# Patient Record
Sex: Male | Born: 1960 | Race: Asian | Hispanic: No | Marital: Married
Health system: Southern US, Academic
[De-identification: ages and names within clinical notes are randomized; demographics above are authoritative.]

## PROBLEM LIST (undated history)

## (undated) ENCOUNTER — Telehealth

## (undated) ENCOUNTER — Ambulatory Visit

## (undated) ENCOUNTER — Encounter

## (undated) ENCOUNTER — Encounter
Attending: Student in an Organized Health Care Education/Training Program | Primary: Student in an Organized Health Care Education/Training Program

## (undated) ENCOUNTER — Ambulatory Visit: Payer: MEDICARE | Attending: Nephrology | Primary: Nephrology

## (undated) ENCOUNTER — Encounter: Attending: Physician Assistant | Primary: Physician Assistant

## (undated) ENCOUNTER — Encounter: Attending: Nephrology | Primary: Nephrology

## (undated) ENCOUNTER — Ambulatory Visit: Attending: Family | Primary: Family

## (undated) ENCOUNTER — Ambulatory Visit: Payer: MEDICARE | Attending: Family | Primary: Family

## (undated) ENCOUNTER — Ambulatory Visit
Payer: MEDICARE | Attending: Student in an Organized Health Care Education/Training Program | Primary: Student in an Organized Health Care Education/Training Program

## (undated) ENCOUNTER — Ambulatory Visit: Payer: MEDICARE

## (undated) ENCOUNTER — Encounter: Attending: Family | Primary: Family

## (undated) ENCOUNTER — Encounter: Attending: Gastroenterology | Primary: Gastroenterology

## (undated) ENCOUNTER — Ambulatory Visit
Payer: MEDICARE | Attending: Rehabilitative and Restorative Service Providers" | Primary: Rehabilitative and Restorative Service Providers"

## (undated) ENCOUNTER — Other Ambulatory Visit

## (undated) ENCOUNTER — Ambulatory Visit: Payer: MEDICARE | Attending: Surgical Critical Care | Primary: Surgical Critical Care

## (undated) ENCOUNTER — Telehealth: Attending: Family | Primary: Family

## (undated) ENCOUNTER — Ambulatory Visit: Payer: MEDICARE | Attending: Registered" | Primary: Registered"

## (undated) DIAGNOSIS — N189 Chronic kidney disease, unspecified: Secondary | ICD-10-CM

## (undated) DIAGNOSIS — I10 Essential (primary) hypertension: Secondary | ICD-10-CM

## (undated) HISTORY — PX: PARATHYROIDECTOMY: SHX19

## (undated) HISTORY — DX: Essential (primary) hypertension: I10

## (undated) HISTORY — PX: RENAL BIOPSY: SHX156

## (undated) HISTORY — PX: PERITONEAL CATHETER INSERTION: SHX2223

## (undated) HISTORY — DX: Chronic kidney disease, unspecified: N18.9

---
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
## 2009-07-07 ENCOUNTER — Emergency Department: Payer: Self-pay | Admitting: Emergency Medicine

## 2010-07-17 ENCOUNTER — Inpatient Hospital Stay: Payer: Self-pay | Admitting: Internal Medicine

## 2010-07-17 ENCOUNTER — Encounter: Payer: Self-pay | Admitting: Cardiovascular Disease

## 2010-07-20 ENCOUNTER — Encounter: Payer: Self-pay | Admitting: Cardiovascular Disease

## 2010-07-22 ENCOUNTER — Encounter: Payer: Self-pay | Admitting: Cardiovascular Disease

## 2010-09-15 ENCOUNTER — Ambulatory Visit: Payer: Self-pay | Admitting: Vascular Surgery

## 2010-09-22 ENCOUNTER — Ambulatory Visit: Payer: Self-pay | Admitting: Vascular Surgery

## 2010-10-01 ENCOUNTER — Ambulatory Visit: Payer: Self-pay | Admitting: Cardiovascular Disease

## 2010-10-01 DIAGNOSIS — R079 Chest pain, unspecified: Secondary | ICD-10-CM

## 2010-10-01 DIAGNOSIS — I10 Essential (primary) hypertension: Secondary | ICD-10-CM

## 2010-11-02 ENCOUNTER — Ambulatory Visit: Payer: Self-pay | Admitting: Internal Medicine

## 2011-01-04 NOTE — Letter (Signed)
Summary: [facility] Rev Report  [facility] Rev Report   Imported By: Marilynne Drivers 11/12/2010 12:24:12  _____________________________________________________________________  External Attachment:    Type:   Image     Comment:   External Document

## 2011-01-04 NOTE — Assessment & Plan Note (Signed)
Summary: NP6/AMD   Visit Type:  Initial Consult Referring Yechiel Erny:  Corinna Capra  CC:  c/o chest pain during his dialysis treatment one month ago.  Occas. has dizziness and headache..  History of Present Illness: Matthew Mathis is a very pleasant 50 year old gentleman, patient of Dr. Candiss Norse, history of end-stage renal disease on dialysis Mathis the past 2 months, also history of hypertension, smoking who presents for new patient evaluation for an episode of chest pain.  He reports that while on dialysis one month ago, he had dizziness, headache and chest pain. Symptoms lasted for 10-20 minutes. He reports that since then, his hemodialysis volume removal as significantly decreased and his headaches and dizziness has resolved. He no longer has any episodes and has had no further chest pain.  He walks 1 mile daily with no symptoms of chest discomfort or shortness of breath. If he does have occasional dizziness, he takes a Tylenol and feels better. He does not drink much fluid as he was told to significantly restrict himself. In the past he has a significant smoking history though he has cut down considerably.  he denies any significant family history of coronary artery disease apart from renal dysfunction  EKG shows normal sinus rhythm with rate of 80 beats per minute, no significant ST or T wave changes  Current Medications (verified): 1)  Bystolic 5 Mg Tabs (Nebivolol Hcl) .... One Tablet Once Daily 2)  Colcrys 0.6 Mg Tabs (Colchicine) .... One Tablet Two Times A Day As Needed For Gout Pain 3)  Oxycodone-Acetaminophen 5-325 Mg Tabs (Oxycodone-Acetaminophen) .... Take One To Two Tablet Every Six Hours Prn 4)  Aspirin 81 Mg Tbec (Aspirin) .... Take One Tablet By Mouth Daily 5)  Uloric 40 Mg Tabs (Febuxostat) .... Take 1 Tablet By Mouth Once A Day 6)  Renvela 800 Mg Tabs (Sevelamer Carbonate) .... Take 2 Tablets Before Meals and 1 Tablet With Snack 7)  Sensipar 30 Mg Tabs (Cinacalcet Hcl) .... Take 1  Tablet By Mouth Once A Day  Allergies (verified): No Known Drug Allergies  Past History:  Past Medical History: Last updated: 09/28/2010 Hypertension Hx. of gout Hx. of chronic renal failure  Past Surgical History: Last updated: 09/28/2010 kidney biopsy; nonspecific  Family History: Last updated: 09/28/2010 Father: Diabetes Mother, brother & sister; hypertension  Social History: Last updated: 09/28/2010 Work for a [employer]. Tobacco Use - Yes. Hx. of heavy smoking before 2 to 3 ppd, now 3 cigarettes daily. Alcohol Use - no Drug Use - no  Risk Factors: Smoking Status: current (09/28/2010)  Vital Signs:  Patient profile:   50 year old male Height:      67 inches Weight:      155 pounds BMI:     24.36 Pulse rate:   80 / minute BP sitting:   130 / 90  (left arm) Cuff size:   regular  Vitals Entered By: Dolores Lory, CMA (October 01, 2010 10:55 AM)  Physical Exam  General:  Well developed, well nourished, in no acute distress. Head:  normocephalic and atraumatic Neck:  Neck supple, no JVD. No masses, thyromegaly or abnormal cervical nodes. Lungs:  Clear bilaterally to auscultation and percussion. Heart:  Non-displaced PMI, chest non-tender; regular rate and rhythm, S1, S2 without murmurs, rubs or gallops. Carotid upstroke normal, no bruit.  Pedals normal pulses. No edema, no varicosities. Abdomen:  Bowel sounds positive; abdomen soft and non-tender without masses Msk:  Back normal, normal gait. Muscle strength and tone normal. Pulses:  pulses normal in all 4 extremities Extremities:  No clubbing or cyanosis. Neurologic:  Alert and oriented x 3. Skin:  Intact without lesions or rashes. Psych:  Normal affect.   Impression & Recommendations:  Problem # 1:  CHEST PAIN UNSPECIFIED (ICD-786.50) his chest pain is somewhat atypical, he has had only one episode during dialysis. He had no further episodes since his dialysis regiment was changed with less  fluid removal. He ambulates with exercise 1 mile per day with no symptoms of chest discomfort. EKG is normal.  I suggested to him that he start aspirin 81 mg daily. That we continue to monitor him and if he has additional episodes of chest pain, we could do additional workup with a stress test. I would hold off on any testing at this time as he is asymptomatic.  We have suggested that we have him check his cholesterol at some point to make sure that he is within an acceptable range. He could possibly have this at his next blood draw with Dr. seen in the HD clinic.  His updated medication list for this problem includes:    Bystolic 5 Mg Tabs (Nebivolol hcl) ..... One tablet once daily    Aspirin 81 Mg Tbec (Aspirin) .[name]... Take one tablet by mouth daily  Orders: TLB-Lipid Panel (80061-LIPID)  Problem # 2:  HYPERTENSION, BENIGN (ICD-401.1) His blood pressure appears relatively well-controlled on today's visit.  His updated medication list for this problem includes:    Bystolic 5 Mg Tabs (Nebivolol hcl) ..... One tablet once daily    Aspirin 81 Mg Tbec (Aspirin) .[name]... Take one tablet by mouth daily  Other Orders: EKG w/ Interpretation (93000)  Patient Instructions: 1)  Your physician recommends that you schedule a follow-up appointment as needed 2)  Your physician recommends that you have your lipids checked at dialysis with other labwork. Electronic order given to pt to take to dialysis and fax results to Dr Rockey Situ.  3)  Your physician has recommended you make the following change in your medication: start 81mg  ASA daily  Appended Document: NP6/AMD cholesterol numbers from August 2011 showed cholesterol 104, LDL 62, HDL 26 I would not recommend a statin given these numbers

## 2011-06-20 ENCOUNTER — Encounter: Payer: Self-pay | Admitting: Cardiovascular Disease

## 2013-01-02 ENCOUNTER — Emergency Department: Payer: Self-pay | Admitting: Emergency Medicine

## 2013-01-02 LAB — URINALYSIS, COMPLETE
Bacteria: NONE SEEN
Glucose,UR: 50 mg/dL (ref 0–75)
Leukocyte Esterase: NEGATIVE
Protein: 30
RBC,UR: 7 /HPF (ref 0–5)
Specific Gravity: 1.006 (ref 1.003–1.030)
Squamous Epithelial: <1
WBC UR: 1 /HPF (ref 0–5)

## 2013-01-02 LAB — COMPREHENSIVE METABOLIC PANEL
Albumin: 3.7 g/dL (ref 3.4–5.0)
Alkaline Phosphatase: 81 U/L (ref 50–136)
BUN: 71 mg/dL — ABNORMAL HIGH (ref 7–18)
Bilirubin,Total: 0.6 mg/dL (ref 0.2–1.0)
Calcium, Total: 8.5 mg/dL (ref 8.5–10.1)
Chloride: 99 mmol/L (ref 98–107)
Co2: 25 mmol/L (ref 21–32)
Creatinine: 10.21 mg/dL — ABNORMAL HIGH (ref 0.60–1.30)
EGFR (African American): 6 — ABNORMAL LOW
EGFR (Non-African Amer.): 5 — ABNORMAL LOW
Glucose: 99 mg/dL (ref 65–99)
Osmolality: 293 (ref 275–301)
Potassium: 3.4 mmol/L — ABNORMAL LOW (ref 3.5–5.1)
SGOT(AST): 13 U/L — ABNORMAL LOW (ref 15–37)
Sodium: 136 mmol/L (ref 136–145)
Total Protein: 7.2 g/dL (ref 6.4–8.2)

## 2013-01-02 LAB — CBC
HCT: 37.1 % — ABNORMAL LOW (ref 40.0–52.0)
HGB: 12.5 g/dL — ABNORMAL LOW (ref 13.0–18.0)
Platelet: 266 10*3/uL (ref 150–440)
RBC: 3.9 10*6/uL — ABNORMAL LOW (ref 4.40–5.90)
RDW: 15.1 % — ABNORMAL HIGH (ref 11.5–14.5)
WBC: 13.8 10*3/uL — ABNORMAL HIGH (ref 3.8–10.6)

## 2013-01-02 LAB — BODY FLUID CELL COUNT WITH DIFFERENTIAL
Basophil: 0 %
Eosinophil: 0 %
Lymphocytes: 35 %
Nucleated Cell Count: 119 /mm3
Other Cells BF: 2 %
Other Mononuclear Cells: 58 %

## 2013-01-02 LAB — PROTIME-INR
INR: 0.9
Prothrombin Time: 13 secs (ref 11.5–14.7)

## 2013-01-08 LAB — CULTURE, BLOOD (SINGLE)

## 2015-07-16 NOTE — Unmapped (Signed)
Letter sent to pt and dialysis

## 2016-10-10 ENCOUNTER — Emergency Department: Payer: Medicare Other

## 2016-10-10 ENCOUNTER — Encounter: Payer: Self-pay | Admitting: *Deleted

## 2016-10-10 ENCOUNTER — Emergency Department
Admission: EM | Admit: 2016-10-10 | Discharge: 2016-10-10 | Disposition: A | Discharge status: Home / Self Care | Payer: Medicare Other | Attending: Emergency Medicine | Admitting: Emergency Medicine

## 2016-10-10 DIAGNOSIS — Z7982 Long term (current) use of aspirin: Secondary | ICD-10-CM | POA: Insufficient documentation

## 2016-10-10 DIAGNOSIS — N189 Chronic kidney disease, unspecified: Secondary | ICD-10-CM | POA: Diagnosis not present

## 2016-10-10 DIAGNOSIS — I129 Hypertensive chronic kidney disease with stage 1 through stage 4 chronic kidney disease, or unspecified chronic kidney disease: Secondary | ICD-10-CM | POA: Diagnosis not present

## 2016-10-10 DIAGNOSIS — R1012 Left upper quadrant pain: Secondary | ICD-10-CM

## 2016-10-10 DIAGNOSIS — Z79899 Other long term (current) drug therapy: Secondary | ICD-10-CM | POA: Insufficient documentation

## 2016-10-10 LAB — CBC
HCT: 33.4 % — ABNORMAL LOW (ref 40.0–52.0)
HEMOGLOBIN: 11.6 g/dL — AB (ref 13.0–18.0)
MCH: 32.8 pg (ref 26.0–34.0)
MCHC: 34.6 g/dL (ref 32.0–36.0)
MCV: 95 fL (ref 80.0–100.0)
Platelets: 335 10*3/uL (ref 150–440)
RBC: 3.52 MIL/uL — AB (ref 4.40–5.90)
RDW: 15.1 % — ABNORMAL HIGH (ref 11.5–14.5)
WBC: 9.5 10*3/uL (ref 3.8–10.6)

## 2016-10-10 LAB — COMPREHENSIVE METABOLIC PANEL
ALK PHOS: 217 U/L — AB (ref 38–126)
ALT: 15 U/L — ABNORMAL LOW (ref 17–63)
ANION GAP: 16 — AB (ref 5–15)
AST: 17 U/L (ref 15–41)
Albumin: 4.2 g/dL (ref 3.5–5.0)
BUN: 60 mg/dL — ABNORMAL HIGH (ref 6–20)
CALCIUM: 7.9 mg/dL — AB (ref 8.9–10.3)
CO2: 26 mmol/L (ref 22–32)
Chloride: 96 mmol/L — ABNORMAL LOW (ref 101–111)
Creatinine, Ser: 12.48 mg/dL — ABNORMAL HIGH (ref 0.61–1.24)
GFR calc non Af Amer: 4 mL/min — ABNORMAL LOW (ref >60)
GFR, EST AFRICAN AMERICAN: 5 mL/min — AB (ref >60)
Glucose, Bld: 186 mg/dL — ABNORMAL HIGH (ref 65–99)
POTASSIUM: 4.1 mmol/L (ref 3.5–5.1)
SODIUM: 138 mmol/L (ref 135–145)
TOTAL PROTEIN: 8.1 g/dL (ref 6.5–8.1)
Total Bilirubin: 0.6 mg/dL (ref 0.3–1.2)

## 2016-10-10 LAB — LIPASE, BLOOD: Lipase: 31 U/L (ref 11–51)

## 2016-10-10 MED ORDER — TRAMADOL HCL 50 MG PO TABS: 50.0000 mg | ORAL_TABLET | Freq: Four times a day (QID) | ORAL | 0 refills | Status: DC | PRN

## 2016-10-10 NOTE — ED Provider Notes (Signed)
[facility] Emergency Department Provider Note        Time seen: ----------------------------------------- 1:54 PM on 10/10/2016 -----------------------------------------    I have reviewed the triage vital signs and the nursing notes.   HISTORY  Chief Complaint Abdominal Pain    HPI Matthew Mathis is a 55 y.o. male who presents to the ER for abdominal pain for the last month. Patient's pain is in the left upper quadrant, he denies fevers or any other symptoms. Currently is on peritoneal dialysis. Patient states the pain is worse during the time when he is on peritoneal dialysis. When he first wakes up in the morning his pain is minimal to absent, but at night when he is doing peritoneal dialysis pain gets much worse. Pain is 6 inches superior to his dialysis catheter insertion. He states he is going to the bathroom normally, was seen by nephrology 2 weeks ago and placed on Protonix.   Past Medical History:  Diagnosis Date  . Chronic renal failure    hx   . Gout    hx  . HTN (hypertension)     Patient Active Problem List   Diagnosis Date Noted  . HYPERTENSION, BENIGN 10/01/2010  . CHEST PAIN UNSPECIFIED 10/01/2010    Past Surgical History:  Procedure Laterality Date  . RENAL BIOPSY     non specific     Allergies Patient has no known allergies.  Social History Social History  Substance Use Topics  . Smoking status: Never Smoker  . Smokeless tobacco: Not on file     Comment: used to smoke 2-3 ppd, now smokes 3 cigarettes daily   . Alcohol use No    Review of Systems Constitutional: Negative for fever. Cardiovascular: Negative for chest pain. Respiratory: Negative for shortness of breath. Gastrointestinal: Positive for abdominal pain Genitourinary: Negative for dysuria. Musculoskeletal: Negative for back pain. Skin: Negative for rash. Neurological: Negative for headaches, focal weakness or numbness.  10-point ROS otherwise  negative.  ____________________________________________   PHYSICAL EXAM:  VITAL SIGNS: ED Triage Vitals  Enc Vitals Group     BP 10/10/16 1054 134/79     Pulse Rate 10/10/16 1054 89     Resp 10/10/16 1054 18     Temp 10/10/16 1054 98.5 F (36.9 C)     Temp Source 10/10/16 1054 Oral     SpO2 10/10/16 1054 100 %     Weight 10/10/16 1056 165 lb (74.8 kg)     Height 10/10/16 1056 5\' 4"  (1.626 m)     Head Circumference --      Peak Flow --      Pain Score --      Pain Loc --      Pain Edu? --      Excl. in [place]? --     Constitutional: Alert and oriented. Well appearing and in no distress. Eyes: Conjunctivae are normal. PERRL. Normal extraocular movements. ENT   Head: Normocephalic and atraumatic.   Nose: No congestion/rhinnorhea.   Mouth/Throat: Mucous membranes are moist.   Neck: No stridor. Cardiovascular: Normal rate, regular rhythm. No murmurs, rubs, or gallops. Respiratory: Normal respiratory effort without tachypnea nor retractions. Breath sounds are clear and equal bilaterally. No wheezes/rales/rhonchi. Gastrointestinal: Soft and nontender. Normal bowel sounds, peritoneal dialysis catheter site is unremarkable Musculoskeletal: Nontender with normal range of motion in all extremities. No lower extremity tenderness nor edema. Neurologic:  Normal speech and language. No gross focal neurologic deficits are appreciated.  Skin:  Skin is warm,  dry and intact. No rash noted. Psychiatric: Mood and affect are normal. Speech and behavior are normal.  ____________________________________________  ED COURSE:  Pertinent labs & imaging results that were available during my care of the patient were reviewed by me and considered in my medical decision making (see chart for details). Clinical Course   Patient is in no distress, no signs of peritonitis or concerning intra-abdominal infection. I will discussed with nephrology and check basic  labs.  Procedures ____________________________________________   LABS (pertinent positives/negatives)  Labs Reviewed  COMPREHENSIVE METABOLIC PANEL - Abnormal; Notable for the following:       Result Value   Chloride 96 (*)    Glucose, Bld 186 (*)    BUN 60 (*)    Creatinine, Ser 12.48 (*)    Calcium 7.9 (*)    ALT 15 (*)    Alkaline Phosphatase 217 (*)    GFR calc non Af Amer 4 (*)    GFR calc Af Amer 5 (*)    Anion gap 16 (*)    All other components within normal limits  CBC - Abnormal; Notable for the following:    RBC 3.52 (*)    Hemoglobin 11.6 (*)    HCT 33.4 (*)    RDW 15.1 (*)    All other components within normal limits  LIPASE, BLOOD  URINALYSIS COMPLETEWITH MICROSCOPIC ([facility] ONLY)    RADIOLOGY Images were viewed by me  FINDINGS: Bowel gas pattern is unremarkable. Peritoneal dialysis catheter is coiled in the anatomic pelvis. Probable renal artery calcifications bilaterally. No acute findings.  IMPRESSION: Negative.  ____________________________________________  FINAL ASSESSMENT AND PLAN  Abdominal pain, peritoneal dialysis  Plan: Patient with labs and imaging as dictated above. Patient is in no distress, patient has been discussed with nephrology. This is a subacute or chronic problem at this point. He does not have signs of peritonitis, labs are reassuring. He will follow up as an outpatient. He'll be given mild pain medication and encouraged to return for worsening symptoms.   Earleen Newport, MD   Note: This dictation was prepared with Dragon dictation. Any transcriptional errors that result from this process are unintentional    Earleen Newport, MD 10/10/16 [phone]

## 2016-10-10 NOTE — ED Notes (Signed)
Pt awake and alert, resting in bed, playing on phone, pt in no distress

## 2016-10-10 NOTE — ED Triage Notes (Signed)
Pt performs peritoneal dialysis at home, pt complains of left upper quad pain for 1 month, pt denies fevers or any other symptoms

## 2016-10-10 NOTE — ED Notes (Signed)
EDP at bedside, pt does PD, pt states abd pain for 1 month, denies any fevers or cloudy fluid, pt awake and alert in no acute distress

## 2016-11-02 ENCOUNTER — Other Ambulatory Visit: Payer: Self-pay | Admitting: Nephrology

## 2016-11-02 DIAGNOSIS — R109 Unspecified abdominal pain: Secondary | ICD-10-CM

## 2016-11-04 ENCOUNTER — Ambulatory Visit: Payer: Medicare Other

## 2016-11-10 ENCOUNTER — Ambulatory Visit
Admission: RE | Admit: 2016-11-10 | Discharge: 2016-11-10 | Disposition: A | Discharge status: Home / Self Care | Payer: Medicare Other | Source: Ambulatory Visit | Attending: Nephrology | Admitting: Nephrology

## 2016-11-10 DIAGNOSIS — N281 Cyst of kidney, acquired: Secondary | ICD-10-CM | POA: Diagnosis not present

## 2016-11-10 DIAGNOSIS — R109 Unspecified abdominal pain: Secondary | ICD-10-CM | POA: Diagnosis present

## 2016-11-10 DIAGNOSIS — R93422 Abnormal radiologic findings on diagnostic imaging of left kidney: Secondary | ICD-10-CM | POA: Insufficient documentation

## 2016-11-10 DIAGNOSIS — R93421 Abnormal radiologic findings on diagnostic imaging of right kidney: Secondary | ICD-10-CM | POA: Diagnosis not present

## 2017-05-08 ENCOUNTER — Other Ambulatory Visit (INDEPENDENT_AMBULATORY_CARE_PROVIDER_SITE_OTHER): Payer: Self-pay | Admitting: Vascular Surgery

## 2017-05-08 ENCOUNTER — Encounter (INDEPENDENT_AMBULATORY_CARE_PROVIDER_SITE_OTHER): Payer: Self-pay | Admitting: Vascular Surgery

## 2017-05-08 ENCOUNTER — Ambulatory Visit (INDEPENDENT_AMBULATORY_CARE_PROVIDER_SITE_OTHER): Payer: Medicare Other | Admitting: Vascular Surgery

## 2017-05-08 ENCOUNTER — Encounter (INDEPENDENT_AMBULATORY_CARE_PROVIDER_SITE_OTHER): Payer: Self-pay

## 2017-05-08 VITALS — BP 146/88 | HR 73 | Resp 16 | Ht 66.0 in | Wt 168.0 lb

## 2017-05-08 DIAGNOSIS — T829XXA Unspecified complication of cardiac and vascular prosthetic device, implant and graft, initial encounter: Secondary | ICD-10-CM | POA: Diagnosis not present

## 2017-05-08 DIAGNOSIS — I10 Essential (primary) hypertension: Secondary | ICD-10-CM

## 2017-05-08 DIAGNOSIS — N186 End stage renal disease: Secondary | ICD-10-CM | POA: Diagnosis not present

## 2017-05-08 NOTE — Progress Notes (Signed)
MRN : [id number]  Matthew Mathis is a 56 y.o. (Oct 28, 1961) male who presents with chief complaint of  Chief Complaint  Patient presents with  . New Evaluation    Discuss PD cath   .  History of Present Illness: The patient returns to the office for follow up regarding problem with the dialysis access. Currently the patient is maintained via a PD catheter The patient has had multiple failed upper extremity accesses.  The patient has redness and swelling at the access site. The patient denies fever or chills at home or while on dialysis.  The patient denies amaurosis fugax or recent TIA symptoms. There are no recent neurological changes noted. The patient denies claudication symptoms or rest pain symptoms. The patient denies history of DVT, PE or superficial thrombophlebitis. The patient denies recent episodes of angina or shortness of breath.      Current Meds  Medication Sig  . allopurinol (ZYLOPRIM) 100 MG tablet Take 1 tablet by mouth daily.  [name] aspirin 81 MG EC tablet Take 81 mg by mouth daily.    . calcium acetate (PHOSLO) 667 MG capsule Take 1,334 mg by mouth 3 (three) times daily. Take 2 capsules (1,334mg ) three times a day and 1 capsule with snacks.  . cinacalcet (SENSIPAR) 30 MG tablet Take 30 mg by mouth daily.    . febuxostat (ULORIC) 40 MG tablet Take 80 mg by mouth daily.    . nebivolol (BYSTOLIC) 5 MG tablet Take 5 mg by mouth daily.    . sevelamer (RENVELA) 800 MG tablet Take by mouth. Take 2 tablets with meals and 1 tablet with snack     Past Medical History:  Diagnosis Date  . Chronic renal failure    hx   . Gout    hx  . HTN (hypertension)     Past Surgical History:  Procedure Laterality Date  . RENAL BIOPSY     non specific     Social History Social History  Substance Use Topics  . Smoking status: Never Smoker  . Smokeless tobacco: Never Used     Comment: used to smoke 2-3 ppd, now smokes 3 cigarettes daily   . Alcohol use No    Family  History Family History  Problem Relation Age of Onset  . Diabetes Father   . Hypertension Mother        brother, sister     No Known Allergies   REVIEW OF SYSTEMS (Negative unless checked)  Constitutional: [] Weight loss  [] Fever  [] Chills Cardiac: [] Chest pain   [] Chest pressure   [] Palpitations   [] Shortness of breath when laying flat   [] Shortness of breath with exertion. Vascular:  [] Pain in legs with walking   [] Pain in legs at rest  [] History of DVT   [] Phlebitis   [] Swelling in legs   [] Varicose veins   [] Non-healing ulcers Pulmonary:   [] Uses home oxygen   [] Productive cough   [] Hemoptysis   [] Wheeze  [] COPD   [] Asthma Neurologic:  [] Dizziness   [] Seizures   [] History of stroke   [] History of TIA  [] Aphasia   [] Vissual changes   [] Weakness or numbness in arm   [] Weakness or numbness in leg Musculoskeletal:   [] Joint swelling   [] Joint pain   [] Low back pain Hematologic:  [] Easy bruising  [] Easy bleeding   [] Hypercoagulable state   [] Anemic Gastrointestinal:  [] Diarrhea   [] Vomiting  [] Gastroesophageal reflux/heartburn   [] Difficulty swallowing. Genitourinary:  [x] Chronic kidney disease   [] Difficult urination  []   Frequent urination   [] Blood in urine Skin:  [] Rashes   [] Ulcers  Psychological:  [] History of anxiety   []  History of major depression.  Physical Examination  Vitals:   05/08/17 0913  BP: (!) 146/88  Pulse: 73  Resp: 16  Weight: 168 lb (76.2 kg)  Height: 5\' 6"  (1.676 m)   Body mass index is 27.12 kg/m. Gen: WD/WN, NAD Head: /AT, No temporalis wasting.  Ear/Nose/Throat: Hearing grossly intact, nares w/o erythema or drainage, poor dentition Eyes: PER, EOMI, sclera nonicteric.  Neck: Supple, no masses.  No bruit or JVD.  Pulmonary:  Good air movement, clear to auscultation bilaterally, no use of accessory muscles.  Cardiac: RRR, normal S1, S2, no Murmurs. Vascular: PD cath with drainage  Vessel Right Left  Radial Palpable Palpable  Ulnar Palpable Palpable   Gastrointestinal: soft, non-distended. No guarding/no peritoneal signs.  Musculoskeletal: M/S 5/5 throughout.  No deformity or atrophy.  Neurologic: CN 2-12 intact. Pain and light touch intact in extremities.  Symmetrical.  Speech is fluent. Motor exam as listed above. Psychiatric: Judgment intact, Mood & affect appropriate for pt's clinical situation. Dermatologic: No rashes or ulcers noted.  No changes consistent with cellulitis. Lymph : No Cervical lymphadenopathy, no lichenification or skin changes of chronic lymphedema.  CBC Lab Results  Component Value Date   WBC 9.5 10/10/2016   HGB 11.6 (L) 10/10/2016   HCT 33.4 (L) 10/10/2016   MCV 95.0 10/10/2016   PLT 335 10/10/2016    BMET    Component Value Date/Time   NA 138 10/10/2016 1100   NA 136 01/02/2013 1611   K 4.1 10/10/2016 1100   K 3.4 (L) 01/02/2013 1611   CL 96 (L) 10/10/2016 1100   CL 99 01/02/2013 1611   CO2 26 10/10/2016 1100   CO2 25 01/02/2013 1611   GLUCOSE 186 (H) 10/10/2016 1100   GLUCOSE 99 01/02/2013 1611   BUN 60 (H) 10/10/2016 1100   BUN 71 (H) 01/02/2013 1611   CREATININE 12.48 (H) 10/10/2016 1100   CREATININE 10.21 (H) 01/02/2013 1611   CALCIUM 7.9 (L) 10/10/2016 1100   CALCIUM 8.5 01/02/2013 1611   GFRNONAA 4 (L) 10/10/2016 1100   GFRNONAA 5 (L) 01/02/2013 1611   GFRAA 5 (L) 10/10/2016 1100   GFRAA 6 (L) 01/02/2013 1611   CrCl cannot be calculated (Patient's most recent lab result is older than the maximum 21 days allowed.).  COAG Lab Results  Component Value Date   INR 0.9 01/02/2013    Radiology No results found.  Assessment/Plan 1. End stage renal disease ([place]) Recommend:  The patient is experiencing increasing problems with their dialysis access.  Patient should have removal of her PD cath replacement of a PD catheter and insertion of a tunneled catheter.    The risks, benefits and alternative therapies were reviewed in detail with the patient.  All questions were answered.   The patient agrees to proceed with angio/intervention.    2. Complication from renal dialysis device, initial encounter See #1  3. HYPERTENSION, BENIGN Continue antihypertensive medications as already ordered, these medications have been reviewed and there are no changes at this time.     Hortencia Pilar, MD  05/08/2017 12:44 PM

## 2017-05-15 ENCOUNTER — Inpatient Hospital Stay: Admission: RE | Admit: 2017-05-15 | Payer: Medicare Other | Source: Ambulatory Visit

## 2017-05-15 ENCOUNTER — Encounter (INDEPENDENT_AMBULATORY_CARE_PROVIDER_SITE_OTHER): Payer: Self-pay | Admitting: Vascular Surgery

## 2017-05-18 ENCOUNTER — Encounter
Admission: RE | Admit: 2017-05-18 | Discharge: 2017-05-18 | Disposition: A | Discharge status: Home / Self Care | Payer: Medicare Other | Source: Ambulatory Visit | Attending: Vascular Surgery | Admitting: Vascular Surgery

## 2017-05-18 DIAGNOSIS — Z01812 Encounter for preprocedural laboratory examination: Secondary | ICD-10-CM | POA: Diagnosis present

## 2017-05-18 DIAGNOSIS — Z01810 Encounter for preprocedural cardiovascular examination: Secondary | ICD-10-CM | POA: Insufficient documentation

## 2017-05-18 LAB — BASIC METABOLIC PANEL
Anion gap: 16 — ABNORMAL HIGH (ref 5–15)
BUN: 61 mg/dL — AB (ref 6–20)
CO2: 26 mmol/L (ref 22–32)
CREATININE: 13.1 mg/dL — AB (ref 0.61–1.24)
Calcium: 8.9 mg/dL (ref 8.9–10.3)
Chloride: 97 mmol/L — ABNORMAL LOW (ref 101–111)
GFR calc Af Amer: 4 mL/min — ABNORMAL LOW (ref >60)
GFR, EST NON AFRICAN AMERICAN: 4 mL/min — AB (ref >60)
Glucose, Bld: 102 mg/dL — ABNORMAL HIGH (ref 65–99)
Potassium: 4 mmol/L (ref 3.5–5.1)
SODIUM: 139 mmol/L (ref 135–145)

## 2017-05-18 LAB — CBC WITH DIFFERENTIAL/PLATELET
BASOS ABS: 0.1 10*3/uL (ref 0–0.1)
Basophils Relative: 1 %
EOS ABS: 0.9 10*3/uL — AB (ref 0–0.7)
EOS PCT: 9 %
HCT: 33.5 % — ABNORMAL LOW (ref 40.0–52.0)
Hemoglobin: 11.3 g/dL — ABNORMAL LOW (ref 13.0–18.0)
LYMPHS ABS: 0.7 10*3/uL — AB (ref 1.0–3.6)
Lymphocytes Relative: 8 %
MCH: 32.4 pg (ref 26.0–34.0)
MCHC: 33.8 g/dL (ref 32.0–36.0)
MCV: 96 fL (ref 80.0–100.0)
MONO ABS: 0.7 10*3/uL (ref 0.2–1.0)
Monocytes Relative: 7 %
Neutro Abs: 7.2 10*3/uL — ABNORMAL HIGH (ref 1.4–6.5)
Neutrophils Relative %: 75 %
PLATELETS: 282 10*3/uL (ref 150–440)
RBC: 3.49 MIL/uL — AB (ref 4.40–5.90)
RDW: 15.8 % — AB (ref 11.5–14.5)
WBC: 9.5 10*3/uL (ref 3.8–10.6)

## 2017-05-18 LAB — TYPE AND SCREEN
ABO/RH(D): O POS
ANTIBODY SCREEN: NEGATIVE

## 2017-05-18 LAB — PROTIME-INR
INR: 0.99
PROTHROMBIN TIME: 13.1 s (ref 11.4–15.2)

## 2017-05-18 LAB — SURGICAL PCR SCREEN
MRSA, PCR: NEGATIVE
STAPHYLOCOCCUS AUREUS: NEGATIVE

## 2017-05-18 LAB — APTT: APTT: 31 s (ref 24–36)

## 2017-05-18 NOTE — Patient Instructions (Signed)
Your procedure is scheduled on: Wednesday 05/24/17 Report to [place]. 2ND FLOOR MEDICAL MALL ENTRANCE. To find out your arrival time please call [phone] between 1PM - 3PM on Tuesday 05/23/17.  Remember: Instructions that are not followed completely may result in serious medical risk, up to and including death, or upon the discretion of your surgeon and anesthesiologist your surgery may need to be rescheduled.    __X__ 1. Do not eat food or drink liquids after midnight. No gum chewing or hard candies.     __X__ 2. No Alcohol for 24 hours before or after surgery.   ____ 3. Bring all medications with you on the day of surgery if instructed.    __X__ 4. Notify your doctor if there is any change in your medical condition     (cold, fever, infections).             __X___5. No smoking within 24 hours of your surgery.     Do not wear jewelry, make-up, hairpins, clips or nail polish.  Do not wear lotions, powders, or perfumes.   Do not shave 48 hours prior to surgery. Men may shave face and neck.  Do not bring valuables to the hospital.    [facility] is not responsible for any belongings or valuables.               Contacts, dentures or bridgework may not be worn into surgery.  Leave your suitcase in the car. After surgery it may be brought to your room.  For patients admitted to the hospital, discharge time is determined by your                treatment team.   Patients discharged the day of surgery will not be allowed to drive home.   Please read over the following fact sheets that you were given:   MRSA Information   __X__ Take these medicines the morning of surgery with A SIP OF WATER:    1. IRBESARTAN  2.   3.   4.  5.  6.  ____ Fleet Enema (as directed)   __X__ Use CHG Soap as directed  ____ Use inhalers on the day of surgery  ____ Stop metformin 2 days prior to surgery    ____ Take 1/2 of usual insulin dose the night before surgery and none on the morning of  surgery.   ____ Stop Coumadin/Plavix/aspirin on   ____ Stop Anti-inflammatories such as Advil, Aleve, Ibuprofen, Motrin, Naproxen, Naprosyn, Goodies,powder, or aspirin products.  OK to take Tylenol.   ____ Stop supplements until after surgery.    ____ Bring C-Pap to the hospital.

## 2017-05-24 ENCOUNTER — Ambulatory Visit: Payer: Medicare Other | Admitting: Anesthesiology

## 2017-05-24 ENCOUNTER — Ambulatory Visit: Payer: Medicare Other

## 2017-05-24 ENCOUNTER — Encounter: Payer: Self-pay | Admitting: *Deleted

## 2017-05-24 ENCOUNTER — Ambulatory Visit
Admission: RE | Admit: 2017-05-24 | Discharge: 2017-05-24 | Disposition: A | Discharge status: Home / Self Care | Payer: Medicare Other | Source: Ambulatory Visit | Attending: Vascular Surgery | Admitting: Vascular Surgery

## 2017-05-24 ENCOUNTER — Encounter
Admission: RE | Disposition: A | Discharge status: Home / Self Care | Payer: Self-pay | Source: Ambulatory Visit | Attending: Vascular Surgery

## 2017-05-24 DIAGNOSIS — I120 Hypertensive chronic kidney disease with stage 5 chronic kidney disease or end stage renal disease: Secondary | ICD-10-CM | POA: Insufficient documentation

## 2017-05-24 DIAGNOSIS — Z6827 Body mass index (BMI) 27.0-27.9, adult: Secondary | ICD-10-CM | POA: Diagnosis not present

## 2017-05-24 DIAGNOSIS — E669 Obesity, unspecified: Secondary | ICD-10-CM | POA: Diagnosis not present

## 2017-05-24 DIAGNOSIS — J449 Chronic obstructive pulmonary disease, unspecified: Secondary | ICD-10-CM | POA: Insufficient documentation

## 2017-05-24 DIAGNOSIS — N186 End stage renal disease: Secondary | ICD-10-CM | POA: Diagnosis not present

## 2017-05-24 DIAGNOSIS — F17210 Nicotine dependence, cigarettes, uncomplicated: Secondary | ICD-10-CM | POA: Diagnosis not present

## 2017-05-24 DIAGNOSIS — Z79899 Other long term (current) drug therapy: Secondary | ICD-10-CM | POA: Diagnosis not present

## 2017-05-24 HISTORY — PX: REMOVAL OF A DIALYSIS CATHETER: SHX6053

## 2017-05-24 HISTORY — PX: INSERTION OF DIALYSIS CATHETER: SHX1324

## 2017-05-24 HISTORY — PX: CAPD INSERTION: SHX5233

## 2017-05-24 LAB — POCT I-STAT 4, (NA,K, GLUC, HGB,HCT)
Glucose, Bld: 107 mg/dL — ABNORMAL HIGH (ref 65–99)
HEMATOCRIT: 32 % — AB (ref 39.0–52.0)
HEMOGLOBIN: 10.9 g/dL — AB (ref 13.0–17.0)
POTASSIUM: 3.7 mmol/L (ref 3.5–5.1)
SODIUM: 140 mmol/L (ref 135–145)

## 2017-05-24 LAB — ABO/RH: ABO/RH(D): O POS

## 2017-05-24 SURGERY — LAPAROSCOPIC INSERTION CONTINUOUS AMBULATORY PERITONEAL DIALYSIS  (CAPD) CATHETER
Anesthesia: General | Laterality: Right | Wound class: Clean Contaminated

## 2017-05-24 MED ORDER — ONDANSETRON HCL 4 MG/2ML IJ SOLN
INTRAMUSCULAR | Status: DC | PRN
  Administered 2017-05-24: 4 mg via INTRAVENOUS

## 2017-05-24 MED ORDER — VECURONIUM BROMIDE 10 MG IV SOLR
INTRAVENOUS | Status: AC
  Filled 2017-05-24: qty 10

## 2017-05-24 MED ORDER — LIDOCAINE-EPINEPHRINE (PF) 1 %-1:200000 IJ SOLN
INTRAMUSCULAR | Status: AC
  Filled 2017-05-24: qty 30

## 2017-05-24 MED ORDER — SODIUM CHLORIDE 0.9 % IV SOLN
INTRAVENOUS | Status: DC
  Administered 2017-05-24: 50 mL/h via INTRAVENOUS
  Administered 2017-05-24: 08:00:00 via INTRAVENOUS

## 2017-05-24 MED ORDER — FENTANYL CITRATE (PF) 250 MCG/5ML IJ SOLN
INTRAMUSCULAR | Status: AC
  Filled 2017-05-24: qty 5

## 2017-05-24 MED ORDER — CEFAZOLIN SODIUM-DEXTROSE 2-4 GM/100ML-% IV SOLN
2.0000 g | INTRAVENOUS | Status: AC
  Administered 2017-05-24: 2 g via INTRAVENOUS

## 2017-05-24 MED ORDER — HYDROCODONE-ACETAMINOPHEN 5-325 MG PO TABS
1.0000 | ORAL_TABLET | Freq: Four times a day (QID) | ORAL | Status: DC | PRN
  Administered 2017-05-24: 1 via ORAL

## 2017-05-24 MED ORDER — DEXAMETHASONE SODIUM PHOSPHATE 10 MG/ML IJ SOLN
INTRAMUSCULAR | Status: AC
  Filled 2017-05-24: qty 1

## 2017-05-24 MED ORDER — FENTANYL CITRATE (PF) 100 MCG/2ML IJ SOLN
INTRAMUSCULAR | Status: AC
  Administered 2017-05-24: 25 ug via INTRAVENOUS
  Filled 2017-05-24: qty 2

## 2017-05-24 MED ORDER — VECURONIUM BROMIDE 10 MG IV SOLR
INTRAVENOUS | Status: DC | PRN
  Administered 2017-05-24: 2 mg via INTRAVENOUS
  Administered 2017-05-24 (×2): 1 mg via INTRAVENOUS

## 2017-05-24 MED ORDER — CEFAZOLIN SODIUM-DEXTROSE 2-4 GM/100ML-% IV SOLN
INTRAVENOUS | Status: DC
Start: 2017-05-24 — End: 2017-05-24
  Filled 2017-05-24: qty 100

## 2017-05-24 MED ORDER — PHENYLEPHRINE HCL 10 MG/ML IJ SOLN
INTRAMUSCULAR | Status: DC | PRN
  Administered 2017-05-24 (×7): 100 ug via INTRAVENOUS

## 2017-05-24 MED ORDER — FENTANYL CITRATE (PF) 100 MCG/2ML IJ SOLN
INTRAMUSCULAR | Status: DC | PRN
  Administered 2017-05-24 (×2): 50 ug via INTRAVENOUS
  Administered 2017-05-24: 100 ug via INTRAVENOUS
  Administered 2017-05-24: 50 ug via INTRAVENOUS

## 2017-05-24 MED ORDER — HEPARIN SODIUM (PORCINE) 10000 UNIT/ML IJ SOLN
INTRAMUSCULAR | Status: AC
  Filled 2017-05-24: qty 1

## 2017-05-24 MED ORDER — DEXAMETHASONE SODIUM PHOSPHATE 10 MG/ML IJ SOLN
INTRAMUSCULAR | Status: DC | PRN
  Administered 2017-05-24: 10 mg via INTRAVENOUS

## 2017-05-24 MED ORDER — BACITRACIN 500 UNIT/GM EX OINT
TOPICAL_OINTMENT | CUTANEOUS | Status: DC | PRN
  Administered 2017-05-24: 1 via TOPICAL

## 2017-05-24 MED ORDER — LIDOCAINE HCL (PF) 2 % IJ SOLN
INTRAMUSCULAR | Status: AC
  Filled 2017-05-24: qty 2

## 2017-05-24 MED ORDER — SUGAMMADEX SODIUM 200 MG/2ML IV SOLN
INTRAVENOUS | Status: AC
  Filled 2017-05-24: qty 2

## 2017-05-24 MED ORDER — HYDROCODONE-ACETAMINOPHEN 5-325 MG PO TABS
ORAL_TABLET | ORAL | Status: AC
  Filled 2017-05-24: qty 1

## 2017-05-24 MED ORDER — BACITRACIN ZINC 500 UNIT/GM EX OINT
TOPICAL_OINTMENT | CUTANEOUS | Status: AC
  Filled 2017-05-24: qty 0.9

## 2017-05-24 MED ORDER — CHLORHEXIDINE GLUCONATE CLOTH 2 % EX PADS: 6.0000 | MEDICATED_PAD | Freq: Once | CUTANEOUS | Status: DC

## 2017-05-24 MED ORDER — HYDROMORPHONE HCL 1 MG/ML IJ SOLN
INTRAMUSCULAR | Status: AC
  Administered 2017-05-24: 0.25 mg via INTRAVENOUS
  Filled 2017-05-24: qty 1

## 2017-05-24 MED ORDER — FENTANYL CITRATE (PF) 100 MCG/2ML IJ SOLN
25.0000 ug | INTRAMUSCULAR | Status: AC | PRN
  Administered 2017-05-24 (×6): 25 ug via INTRAVENOUS

## 2017-05-24 MED ORDER — SUGAMMADEX SODIUM 200 MG/2ML IV SOLN
INTRAVENOUS | Status: DC | PRN
  Administered 2017-05-24: 145.2 mg via INTRAVENOUS

## 2017-05-24 MED ORDER — PROPOFOL 10 MG/ML IV BOLUS
INTRAVENOUS | Status: AC
  Filled 2017-05-24: qty 20

## 2017-05-24 MED ORDER — ROCURONIUM BROMIDE 50 MG/5ML IV SOLN
INTRAVENOUS | Status: AC
  Filled 2017-05-24: qty 1

## 2017-05-24 MED ORDER — SUCCINYLCHOLINE CHLORIDE 20 MG/ML IJ SOLN
INTRAMUSCULAR | Status: AC
  Filled 2017-05-24: qty 1

## 2017-05-24 MED ORDER — PROPOFOL 10 MG/ML IV BOLUS
INTRAVENOUS | Status: DC | PRN
  Administered 2017-05-24: 150 mg via INTRAVENOUS

## 2017-05-24 MED ORDER — SUCCINYLCHOLINE CHLORIDE 20 MG/ML IJ SOLN
INTRAMUSCULAR | Status: DC | PRN
  Administered 2017-05-24: 100 mg via INTRAVENOUS

## 2017-05-24 MED ORDER — HYDROCODONE-ACETAMINOPHEN 5-325 MG PO TABS: 1.0000 | ORAL_TABLET | Freq: Four times a day (QID) | ORAL | 0 refills | Status: DC | PRN

## 2017-05-24 MED ORDER — FAMOTIDINE 20 MG PO TABS
ORAL_TABLET | ORAL | Status: AC
  Administered 2017-05-24: 20 mg via ORAL
  Filled 2017-05-24: qty 1

## 2017-05-24 MED ORDER — LIDOCAINE 2% (20 MG/ML) 5 ML SYRINGE
INTRAMUSCULAR | Status: DC | PRN
  Administered 2017-05-24: 100 mg via INTRAVENOUS

## 2017-05-24 MED ORDER — ONDANSETRON HCL 4 MG/2ML IJ SOLN: 4.0000 mg | Freq: Once | INTRAMUSCULAR | Status: DC | PRN

## 2017-05-24 MED ORDER — ONDANSETRON HCL 4 MG/2ML IJ SOLN
INTRAMUSCULAR | Status: AC
  Filled 2017-05-24: qty 2

## 2017-05-24 MED ORDER — HYDROMORPHONE HCL 1 MG/ML IJ SOLN
0.2500 mg | INTRAMUSCULAR | Status: DC | PRN
  Administered 2017-05-24: 0.25 mg via INTRAVENOUS
  Administered 2017-05-24: 0.5 mg via INTRAVENOUS
  Administered 2017-05-24: 0.25 mg via INTRAVENOUS

## 2017-05-24 MED ORDER — FAMOTIDINE 20 MG PO TABS
20.0000 mg | ORAL_TABLET | Freq: Once | ORAL | Status: AC
  Administered 2017-05-24: 20 mg via ORAL

## 2017-05-24 SURGICAL SUPPLY — 62 items
ADAPTER BETA CAP QUINTON DIALY (ADAPTER) ×4 IMPLANT
ADAPTER CATH DIALYSIS 18.75 (CATHETERS) ×3 IMPLANT
ADAPTER CATH DIALYSIS 18.75CM (CATHETERS) ×1
BLADE SURG 15 STRL LF DISP TIS (BLADE) ×2 IMPLANT
BLADE SURG 15 STRL SS (BLADE) ×2
BLADE SURG SZ11 CARB STEEL (BLADE) ×4 IMPLANT
CANISTER SUCT 1200ML W/VALVE (MISCELLANEOUS) ×4 IMPLANT
CATH DLYS SWAN NECK 62.5CM (CATHETERS) ×4 IMPLANT
CHLORAPREP W/TINT 26ML (MISCELLANEOUS) ×12 IMPLANT
DERMABOND ADVANCED (GAUZE/BANDAGES/DRESSINGS) ×4
DERMABOND ADVANCED .7 DNX12 (GAUZE/BANDAGES/DRESSINGS) ×4 IMPLANT
DRAPE C-ARM XRAY 36X54 (DRAPES) ×4 IMPLANT
DRAPE INCISE IOBAN 66X45 STRL (DRAPES) ×4 IMPLANT
DRAPE LAPAROTOMY 100X77 ABD (DRAPES) IMPLANT
DRAPE PED LAPAROTOMY (DRAPES) IMPLANT
DRAPE SHEET LG 3/4 BI-LAMINATE (DRAPES) ×4 IMPLANT
DRESSING SURGICEL FIBRLLR 1X2 (HEMOSTASIS) IMPLANT
DRSG OPSITE POSTOP 3X4 (GAUZE/BANDAGES/DRESSINGS) ×4 IMPLANT
DRSG SURGICEL FIBRILLAR 1X2 (HEMOSTASIS)
DRSG TEGADERM 6X8 (GAUZE/BANDAGES/DRESSINGS) ×8 IMPLANT
ELECT CAUTERY BLADE 6.4 (BLADE) ×4 IMPLANT
ELECT REM PT RETURN 9FT ADLT (ELECTROSURGICAL) ×4
ELECTRODE REM PT RTRN 9FT ADLT (ELECTROSURGICAL) ×2 IMPLANT
GAUZE SPONGE 4X4 12PLY STRL (GAUZE/BANDAGES/DRESSINGS) ×4 IMPLANT
GAUZE STRETCH 2X75IN STRL (MISCELLANEOUS) IMPLANT
GLOVE BIO SURGEON STRL SZ7 (GLOVE) ×12 IMPLANT
GLOVE INDICATOR 7.5 STRL GRN (GLOVE) ×12 IMPLANT
GLOVE SURG SYN 8.0 (GLOVE) ×8 IMPLANT
GOWN STRL REUS W/ TWL LRG LVL3 (GOWN DISPOSABLE) ×8 IMPLANT
GOWN STRL REUS W/ TWL XL LVL3 (GOWN DISPOSABLE) ×4 IMPLANT
GOWN STRL REUS W/TWL LRG LVL3 (GOWN DISPOSABLE) ×8
GOWN STRL REUS W/TWL XL LVL3 (GOWN DISPOSABLE) ×4
KIT RM TURNOVER STRD PROC AR (KITS) ×4 IMPLANT
LABEL OR SOLS (LABEL) ×4 IMPLANT
MINICAP W/POVIDONE IODINE SOL (MISCELLANEOUS) ×4 IMPLANT
NEEDLE HYPO 25X1 1.5 SAFETY (NEEDLE) ×4 IMPLANT
NEEDLE INSUFFLATION 150MM (ENDOMECHANICALS) ×4 IMPLANT
NS IRRIG 500ML POUR BTL (IV SOLUTION) ×4 IMPLANT
PACK BASIN MINOR ARMC (MISCELLANEOUS) IMPLANT
PACK LAP CHOLECYSTECTOMY (MISCELLANEOUS) ×4 IMPLANT
PENCIL ELECTRO HAND CTR (MISCELLANEOUS) ×4 IMPLANT
SET TRANSFER 6 W/TWIST CLAMP 5 (SET/KITS/TRAYS/PACK) ×4 IMPLANT
SLEEVE ENDOPATH XCEL 5M (ENDOMECHANICALS) ×4 IMPLANT
SPONGE XRAY 4X4 16PLY STRL (MISCELLANEOUS) ×4 IMPLANT
STAPLER SKIN PROX 35W (STAPLE) ×4 IMPLANT
SUT MNCRL AB 4-0 PS2 18 (SUTURE) ×4 IMPLANT
SUT MNCRL+ 5-0 UNDYED PC-3 (SUTURE) ×4 IMPLANT
SUT MONOCRYL 5-0 (SUTURE) ×4
SUT SILK 2 0 (SUTURE)
SUT SILK 2-0 18XBRD TIE 12 (SUTURE) IMPLANT
SUT SILK 3 0 (SUTURE)
SUT SILK 3-0 18XBRD TIE 12 (SUTURE) IMPLANT
SUT SILK 4 0 (SUTURE)
SUT SILK 4-0 18XBRD TIE 12 (SUTURE) IMPLANT
SUT VIC AB 0 CT2 27 (SUTURE) IMPLANT
SUT VIC AB 3-0 SH 27 (SUTURE) ×4
SUT VIC AB 3-0 SH 27X BRD (SUTURE) ×4 IMPLANT
SUT VICRYL 0 AB UR-6 (SUTURE) ×4 IMPLANT
SYR 50ML LL SCALE MARK (SYRINGE) ×4 IMPLANT
SYRINGE 10CC LL (SYRINGE) IMPLANT
TROCAR XCEL NON-BLD 5MMX100MML (ENDOMECHANICALS) ×4 IMPLANT
TUBING INSUFFLATOR HI FLOW (MISCELLANEOUS) ×4 IMPLANT

## 2017-05-24 NOTE — OR Nursing (Signed)
1400 Reinforced dressing with ABD and gauze.  Gown changed.  Large amount of serosanguinous dressing on gown.  Pooling of blood under right flack.  Active bleeding noted at lower edge of dressing.  Dr. Delana Meyer still  In surgery.  PACU aware of need for visit.

## 2017-05-24 NOTE — Op Note (Signed)
[facility]   OPERATIVE NOTE     PROCEDURE: 1. Insertion of a right IJ tunneled dialysis catheter. 2. Catheter placement and cannulation under ultrasound and fluoroscopic guidance 3.  Insertion of right sided peritoneal dialysis catheter with laparoscopic guidance. 4.  Removal of left sided peritoneal dialysis catheter  PRE-OPERATIVE DIAGNOSIS: end-stage renal requiring hemodialysis  POST-OPERATIVE DIAGNOSIS: same as above  SURGEON: Hortencia Pilar  ANESTHESIA: Gen. by LMA  ESTIMATED BLOOD LOSS: Minimal  FINDING(S): 1.  Tips of the catheter in the right atrium on fluoroscopy 2.  No obvious pneumothorax on fluoroscopy  SPECIMEN(S):  none  INDICATIONS:   Nekhi Liwanag is a 56 y.o. male  presents with end stage renal disease.  Therefore, the patient requires a tunneled dialysis catheter placement.  The patient is informed of  the risks catheter placement include but are not limited to: bleeding, infection, central venous injury, pneumothorax, possible venous stenosis, possible malpositioning in the venous system, and possible infections related to long-term catheter presence.  The patient was aware of these risks and agreed to proceed.  DESCRIPTION: The patient was taken back to Special Procedure suite.  Prior to sedation, the patient was given IV antibiotics.  After obtaining adequate sedation, the patient was prepped and draped in the standard fashion for a chest or neck tunneled dialysis catheter placement.  Appropriate Time Out is called.     The the right neck and chest wall are then infiltrated with 1% Lidocaine with epinepherine.  A 19 cm tip to cuff palindrome catheter is then selected, opened on the back table and prepped. Ultrasound is placed in a sterile sleeve.  Under ultrasound guidance, the right internal jugular vein is examined and is noted to be echolucent and easily compressible indicating patency.   An image is recorded for the permanent  record.  The right internal jugular vein is cannulated with the microneedle under direct ultrasound vissualization.  A Microwire followed by a micro sheath is then inserted without difficulty.   A J-wire was then advanced under fluoroscopic guidance into the inferior vena cava and the wire was secured.  Small counter incision was then made at the wire insertion site. A small pocket was fashioned with blunt dissection to allow easier passage of the cuff.  The dilator and peel-away sheath are then advanced over the wire under fluoroscopic guidance. The catheters and advanced through the peel-away sheath. It is approximated to the chest wall after verifying the tips at the atrial caval junction and an exit site is selected.  Small incision is made at the selected exit site and the tunneling device was passed subcutaneously to the neck counter incision. Catheter is then pulled through the subcutaneous tunnel. The catheter is then verified for tip position under fluoroscopy, transected and the hub assembly connected.    Each port was tested by aspirating and flushing.  No resistance was noted.  Each port was then thoroughly flushed with heparinized saline.  The catheter was secured in placed with two interrupted stitches of 0 silk tied to the catheter.  The neck incision was closed with a U-stitch of 4-0 Monocryl.  The neck and chest incision were cleaned and sterile bandages applied including a Biopatch.  Each port was then packed with concentrated heparin (1000 Units/mL) at the manufacturer recommended volumes to each port.  Sterile caps were applied to each port.  On completion fluoroscopy, the tips of the catheter were in the right atrium, and there was no evidence of pneumothorax.  The patient's abdominal area was then prepped and draped in a sterile fashion. Appropriate timeout was then reinitiated.  A small vertical incision was made in the midline just above the umbilicus and the dissection was carried  down through the soft tissue to expose the fascia. Two 0 Vicryl sutures were then used to secure the fascia just lateral to the midline on both sides and an 15 blade was used to incise the fascia. The fascia is then elevated with a bone hook Varies needle is introduced and a saline test was performed indicating intraperitoneal location and insufflated of the abdomen with carbon dioxide is performed to 15 mmHg pressure. A 5 mm trocar is then placed again maintaining elevation of the fascia with a bone hook.  A oblique incision is then made in the right lower quadrant to the lateral portion of the rectus muscle the dissection is carried down through the soft tissues and the fascia is exposed. Small incision is made in the fascia with the Bovie and a pursestring suture of 0 Vicryl is placed. Upon initial attempt of placing the Seldinger needle the adhesions are obstructing the catheter positioning and view of the placement and therefore they must be removed.  Seldinger needle was then inserted under direct visualization and the wire introduced under the view of the camera trocar was then placed and the catheter was advanced into the abdominal cavity over the trocar. Under direct visualization the catheters curled portion was positioned deep into the pelvis just to the right of midline. It was irrigated with heparinized saline. The deep cuff was secured to the fascial pursestring suture. A small counterincision was made in the right upper quadrant of the abdomen and the catheter was brought out this site. The appropriate distal connectors were placed, and I then placed 300 cc of saline through the catheter into the pelvis. The abdomen was desufflated. Immediately, 250 cc of effluent returned through the catheter when the bag was placed to gravity. I took one more look with the camera to ensure that the catheter was in the pelvis and it was. The hasson trocar was then removed. I then closed the incisions with a 2-0  Vicryl in the right lower quadrant incision and subsequently 3-0 Vicryl and 4-0 Monocryl, the other incisions were closed with 3-0 Vicryl and 4-0 Monocryl and placed Dermabond as dressing. Dry dressing was placed around the catheter exit site.  The left sided left lower quadrant catheter is then addressed. Gentle traction localizes the left lower quadrant cuff. Previous incision is open with an 15 blade scalpel and the dissection was carried down to expose the cuff of the tunneled catheter.  The catheter is then freed from the surrounding attachments and adhesions. Once the catheter has been freed circumferentially a pursestring suture of 0 Vicryl is placed within the anterior rectus sheath. The catheter is then removed from the peritoneal cavity and the pursestring suture secured. The second cuff is then localized and dissected free. The catheter is transected just distal to the cuff and subsequently removed in 2 pieces. The lower quadrant incision is then irrigated and closed in layers with Vicryl.. A 4-0 Monocryl was used close the skin. Dermabond is applied to the incision.   Antibiotic ointment and a sterile dressing is applied to the exit site. Patient tolerated procedure well and there were no complications.   COMPLICATIONS: None  CONDITION: Unchanged   Hortencia Pilar [facility] Office: [phone]   05/24/2017, 9:59 AM

## 2017-05-24 NOTE — Anesthesia Preprocedure Evaluation (Signed)
Anesthesia Evaluation  Patient identified by MRN, date of birth, ID band Patient awake    Reviewed: Allergy & Precautions  Airway Mallampati: III       Dental  (+) Teeth Intact, Chipped   Pulmonary COPD, former smoker,     + decreased breath sounds      Cardiovascular Exercise Tolerance: Good hypertension, Pt. on medications and Pt. on home beta blockers  Rhythm:Regular     Neuro/Psych    GI/Hepatic negative GI ROS, Neg liver ROS,   Endo/Other    Renal/GU CRFRenal disease     Musculoskeletal   Abdominal (+) + obese,   Peds negative pediatric ROS (+)  Hematology negative hematology ROS (+)   Anesthesia Other Findings   Reproductive/Obstetrics                             Anesthesia Physical Anesthesia Plan  ASA: III  Anesthesia Plan: General   Post-op Pain Management:    Induction: Intravenous  PONV Risk Score and Plan: 0  Airway Management Planned: Oral ETT  Additional Equipment:   Intra-op Plan:   Post-operative Plan: Extubation in OR  Informed Consent: I have reviewed the patients History and Physical, chart, labs and discussed the procedure including the risks, benefits and alternatives for the proposed anesthesia with the patient or authorized representative who has indicated his/her understanding and acceptance.     Plan Discussed with: CRNA  Anesthesia Plan Comments:         Anesthesia Quick Evaluation

## 2017-05-24 NOTE — Transfer of Care (Signed)
Immediate Anesthesia Transfer of Care Note  Patient: Matthew Mathis  Procedure(s) Performed: Procedure(s): LAPAROSCOPIC INSERTION CONTINUOUS AMBULATORY PERITONEAL DIALYSIS  (CAPD) CATHETER (Right) REMOVAL OF A DIALYSIS CATHETER ( PD CATH ) (Left) INSERTION OF DIALYSIS CATHETER ( TUNNELED CATH ) (Right)  Patient Location: PACU  Anesthesia Type:General  Level of Consciousness: awake, alert  and oriented  Airway & Oxygen Therapy: Patient Spontanous Breathing and Patient connected to face mask oxygen  Post-op Assessment: Report given to RN and Post -op Vital signs reviewed and stable  Post vital signs: Reviewed and stable  Last Vitals:  Vitals:   05/24/17 0611  BP: 133/87  Pulse: 73  Resp: 15  Temp: 36.7 C    Last Pain:  Vitals:   05/24/17 0611  TempSrc: Tympanic      Patients Stated Pain Goal: 0 (52/77/82 4235)  Complications: No apparent anesthesia complications

## 2017-05-24 NOTE — Anesthesia Procedure Notes (Signed)
Procedure Name: Intubation Date/Time: 05/24/2017 7:40 AM Performed by: Marsh Dolly Pre-anesthesia Checklist: Patient identified, Patient being monitored, Timeout performed, Emergency Drugs available and Suction available Patient Re-evaluated:Patient Re-evaluated prior to inductionOxygen Delivery Method: Circle system utilized Preoxygenation: Pre-oxygenation with 100% oxygen Intubation Type: IV induction Ventilation: Mask ventilation without difficulty Laryngoscope Size: 3 and Miller Grade View: Grade I Tube type: Oral Tube size: 7.5 mm Number of attempts: 1 Placement Confirmation: ETT inserted through vocal cords under direct vision,  positive ETCO2 and breath sounds checked- equal and bilateral Secured at: 21 cm Tube secured with: Tape Dental Injury: Teeth and Oropharynx as per pre-operative assessment

## 2017-05-24 NOTE — OR Nursing (Signed)
Redressed both sites with gauze and tegaderm.  Will monitor for bleeding before dc home.

## 2017-05-24 NOTE — H&P (Signed)
[facility] History & Physical Update  The patient was interviewed and re-examined.  The patient's previous History and Physical has been reviewed and is unchanged.  There is no change in the plan of care. We plan to proceed with the scheduled procedure.  Hortencia Pilar, MD  05/24/2017, 7:26 AM

## 2017-05-24 NOTE — Anesthesia Post-op Follow-up Note (Cosign Needed)
Anesthesia QCDR form completed.        

## 2017-05-24 NOTE — OR Nursing (Signed)
23 Dr. Delana Meyer notified in OR of need for Post op visit to evaluate bleeding at surgical sites.

## 2017-05-24 NOTE — OR Nursing (Signed)
[place] Dr. Delana Meyer here to evaluate bleeding.

## 2017-05-24 NOTE — Discharge Instructions (Signed)
AMBULATORY SURGERY  DISCHARGE INSTRUCTIONS   1) The drugs that you were given will stay in your system until tomorrow so for the next 24 hours you should not:  A) Drive an automobile B) Make any legal decisions C) Drink any alcoholic beverage   2) You may resume regular meals tomorrow.  Today it is better to start with liquids and gradually work up to solid foods.  You may eat anything you prefer, but it is better to start with liquids, then soup and crackers, and gradually work up to solid foods.   3) Please notify your doctor immediately if you have any unusual bleeding, trouble breathing, redness and pain at the surgery site, drainage, fever, or pain not relieved by medication.   4) Additional Instructions: Reinforce dressing as needed.  Call MD if dripping from wound. Hospital main number [phone]

## 2017-05-24 NOTE — Anesthesia Postprocedure Evaluation (Signed)
Anesthesia Post Note  Patient: Matthew Mathis  Procedure(s) Performed: Procedure(s) (LRB): LAPAROSCOPIC INSERTION CONTINUOUS AMBULATORY PERITONEAL DIALYSIS  (CAPD) CATHETER (Right) REMOVAL OF A DIALYSIS CATHETER ( PD CATH ) (Left) INSERTION OF DIALYSIS CATHETER ( TUNNELED CATH ) (Right)  Patient location during evaluation: PACU Anesthesia Type: General Level of consciousness: awake Pain management: pain level controlled Vital Signs Assessment: post-procedure vital signs reviewed and stable Respiratory status: spontaneous breathing Cardiovascular status: stable Anesthetic complications: no     Last Vitals:  Vitals:   05/24/17 1040 05/24/17 1055  BP: 121/69 111/73  Pulse: 67 68  Resp: 13 (!) 3  Temp:      Last Pain:  Vitals:   05/24/17 1055  TempSrc:   PainSc: 4                  VAN STAVEREN,Yousaf Sainato

## 2017-05-25 ENCOUNTER — Encounter: Payer: Self-pay | Admitting: Vascular Surgery

## 2017-06-08 ENCOUNTER — Encounter (INDEPENDENT_AMBULATORY_CARE_PROVIDER_SITE_OTHER): Payer: Self-pay | Admitting: Vascular Surgery

## 2017-06-08 ENCOUNTER — Ambulatory Visit (INDEPENDENT_AMBULATORY_CARE_PROVIDER_SITE_OTHER): Payer: Medicare Other | Admitting: Vascular Surgery

## 2017-06-08 VITALS — BP 158/86 | HR 70 | Resp 15 | Ht 64.0 in | Wt 162.0 lb

## 2017-06-08 DIAGNOSIS — N186 End stage renal disease: Secondary | ICD-10-CM

## 2017-06-08 DIAGNOSIS — T829XXA Unspecified complication of cardiac and vascular prosthetic device, implant and graft, initial encounter: Secondary | ICD-10-CM

## 2017-06-08 DIAGNOSIS — I771 Stricture of artery: Secondary | ICD-10-CM | POA: Insufficient documentation

## 2017-06-08 DIAGNOSIS — I10 Essential (primary) hypertension: Secondary | ICD-10-CM

## 2017-06-08 NOTE — Progress Notes (Signed)
MRN : [id number]  Matthew Mathis is a 56 y.o. (Sep 27, 1961) male who presents with chief complaint of  Chief Complaint  Patient presents with  . Re-evaluation    Follow up from procedure  .  History of Present Illness: The patient returns for follow up s/p PD catheter removal and insertion.  He reports continued pain in the left lower quadrant where the catheter was removed.  He reports the wounds have healed no drainage or redness.  The new PD cath has been tested and is working   PROCEDURE Performed 05/24/2017: 1. Insertion of a right IJ tunneled dialysis catheter. 2. Catheter placement and cannulation under ultrasound and fluoroscopic guidance 3.  Insertion of right sided peritoneal dialysis catheter with laparoscopic guidance. 4.  Removal of left sided peritoneal dialysis catheter   Current Meds  Medication Sig  . allopurinol (ZYLOPRIM) 100 MG tablet Take 100 mg by mouth every evening.   . calcitRIOL (ROCALTROL) 0.5 MCG capsule Take 0.5 mcg by mouth daily after lunch.   . calcium acetate (PHOSLO) 667 MG capsule Take 667-1,334 mg by mouth 3 (three) times daily. Take 2 capsules (1,334mg ) three times a day and 1 capsule with snacks.  . cinacalcet (SENSIPAR) 90 MG tablet Take 90 mg by mouth at bedtime.  . enalapril (VASOTEC) 5 MG tablet Take 5 mg by mouth daily.  . fluocinonide (LIDEX) 0.05 % external solution Apply 1 application topically daily.  . folic acid-vitamin b complex-vitamin c-selenium-zinc (DIALYVITE) 3 MG TABS tablet Take 1 tablet by mouth at bedtime.   . furosemide (LASIX) 80 MG tablet Take 80 mg by mouth daily after lunch.   [name] HYDROcodone-acetaminophen (NORCO) 5-325 MG tablet Take 1-2 tablets by mouth every 6 (six) hours as needed for moderate pain or severe pain.  [name] irbesartan (AVAPRO) 150 MG tablet Take 150 mg by mouth daily.  . metoprolol succinate (TOPROL-XL) 25 MG 24 hr tablet Take 25 mg by mouth at bedtime.  . Multiple Vitamins-Minerals (DIALYVITE 800/ULTRA D)  TABS Take 1 tablet by mouth daily.  . mupirocin ointment (BACTROBAN) 2 % APPLY A SMALL AMOUNT TO SKIN ONCE A DAY AS NEEDED APPLY TO EXIT SITE WITH DRESSING CHANGES  . predniSONE (DELTASONE) 20 MG tablet Take 20 mg by mouth as needed (with gout flare).    Past Medical History:  Diagnosis Date  . Chronic renal failure    hx   . Gout    hx  . HTN (hypertension)     Past Surgical History:  Procedure Laterality Date  . CAPD INSERTION Right 05/24/2017   Procedure: LAPAROSCOPIC INSERTION CONTINUOUS AMBULATORY PERITONEAL DIALYSIS  (CAPD) CATHETER;  Surgeon: Katha Cabal, MD;  Location: [facility] ORS;  Service: Vascular;  Laterality: Right;  . INSERTION OF DIALYSIS CATHETER Right 05/24/2017   Procedure: INSERTION OF DIALYSIS CATHETER ( TUNNELED CATH );  Surgeon: Katha Cabal, MD;  Location: [facility] ORS;  Service: Vascular;  Laterality: Right;  . PERITONEAL CATHETER INSERTION    . REMOVAL OF A DIALYSIS CATHETER Left 05/24/2017   Procedure: REMOVAL OF A DIALYSIS CATHETER ( PD CATH );  Surgeon: Katha Cabal, MD;  Location: [facility] ORS;  Service: Vascular;  Laterality: Left;  . RENAL BIOPSY     non specific     Social History Social History  Substance Use Topics  . Smoking status: Former Smoker    Packs/day: 0.50    Types: Cigarettes    Quit date: 12/04/2010  . Smokeless tobacco: Never Used  Comment: used to smoke 2-3 ppd, now smokes 3 cigarettes daily   . Alcohol use No    Family History Family History  Problem Relation Age of Onset  . Diabetes Father   . Hypertension Mother        brother, sister     No Known Allergies   REVIEW OF SYSTEMS (Negative unless checked)  Constitutional: [] Weight loss  [] Fever  [] Chills Cardiac: [] Chest pain   [] Chest pressure   [] Palpitations   [] Shortness of breath when laying flat   [] Shortness of breath with exertion. Vascular:  [] Pain in legs with walking   [] Pain in legs at rest  [] History of DVT   [] Phlebitis   [] Swelling in legs    [] Varicose veins   [] Non-healing ulcers Pulmonary:   [] Uses home oxygen   [] Productive cough   [] Hemoptysis   [] Wheeze  [] COPD   [] Asthma Neurologic:  [] Dizziness   [] Seizures   [] History of stroke   [] History of TIA  [] Aphasia   [] Vissual changes   [] Weakness or numbness in arm   [] Weakness or numbness in leg Musculoskeletal:   [] Joint swelling   [] Joint pain   [] Low back pain Hematologic:  [] Easy bruising  [] Easy bleeding   [] Hypercoagulable state   [] Anemic Gastrointestinal:  [] Diarrhea   [] Vomiting  [] Gastroesophageal reflux/heartburn   [] Difficulty swallowing. Genitourinary:  [x] Chronic kidney disease   [] Difficult urination  [] Frequent urination   [] Blood in urine Skin:  [] Rashes   [] Ulcers  Psychological:  [] History of anxiety   []  History of major depression.  Physical Examination  Vitals:   06/08/17 0841  BP: (!) 158/86  Pulse: 70  Resp: 15  Weight: 162 lb (73.5 kg)  Height: 5\' 4"  (1.626 m)   Body mass index is 27.81 kg/m. Gen: WD/WN, NAD Head: [place]/AT, No temporalis wasting.  Ear/Nose/Throat: Hearing grossly intact, nares w/o erythema or drainage Eyes: PER, EOMI, sclera nonicteric.  Neck: Supple, no large masses.   Pulmonary:  Good air movement, no audible wheezing bilaterally, no use of accessory muscles.  Cardiac: RRR, no JVD Vascular: all incisions are CD&I  Staples removed from left lower quadrant.  Right IJ tunneled cath is CD&I Gastrointestinal: Non-distended. No guarding/no peritoneal signs.  Musculoskeletal: M/S 5/5 throughout.  No deformity or atrophy.  Neurologic: CN 2-12 intact. Symmetrical.  Speech is fluent. Motor exam as listed above. Psychiatric: Judgment intact, Mood & affect appropriate for pt's clinical situation. Dermatologic: No rashes or ulcers noted.  No changes consistent with cellulitis. Lymph : No lichenification or skin changes of chronic lymphedema.  CBC Lab Results  Component Value Date   WBC 9.5 05/18/2017   HGB 10.9 (L) 05/24/2017   HCT  32.0 (L) 05/24/2017   MCV 96.0 05/18/2017   PLT 282 05/18/2017    BMET    Component Value Date/Time   NA 140 05/24/2017 0641   NA 136 01/02/2013 1611   K 3.7 05/24/2017 0641   K 3.4 (L) 01/02/2013 1611   CL 97 (L) 05/18/2017 0836   CL 99 01/02/2013 1611   CO2 26 05/18/2017 0836   CO2 25 01/02/2013 1611   GLUCOSE 107 (H) 05/24/2017 0641   GLUCOSE 99 01/02/2013 1611   BUN 61 (H) 05/18/2017 0836   BUN 71 (H) 01/02/2013 1611   CREATININE 13.10 (H) 05/18/2017 0836   CREATININE 10.21 (H) 01/02/2013 1611   CALCIUM 8.9 05/18/2017 0836   CALCIUM 8.5 01/02/2013 1611   GFRNONAA 4 (L) 05/18/2017 0836   GFRNONAA 5 (L) 01/02/2013 1611  GFRAA 4 (L) 05/18/2017 0836   GFRAA 6 (L) 01/02/2013 1611   CrCl cannot be calculated (Patient's most recent lab result is older than the maximum 21 days allowed.).  COAG Lab Results  Component Value Date   INR 0.99 05/18/2017   INR 0.9 01/02/2013    Radiology Dg C-arm 1-60 Min-no Report  Result Date: 05/24/2017 Fluoroscopy was utilized by the requesting physician.  No radiographic interpretation.    Assessment/Plan 1. Complication from renal dialysis device, initial encounter He is s/p removal and placement of PD cath  Catheter has been tested and is working.  Resume PD in one week  Plan to remove Tunneled cath once PD is working  2. End stage renal disease ([place]) Continue HD for now  3. HYPERTENSION, BENIGN Continue antihypertensive medications as already ordered, these medications have been reviewed and there are no changes at this time.      Hortencia Pilar, MD  06/08/2017 8:45 AM

## 2017-06-23 ENCOUNTER — Encounter (INDEPENDENT_AMBULATORY_CARE_PROVIDER_SITE_OTHER): Payer: Self-pay

## 2017-06-23 ENCOUNTER — Other Ambulatory Visit (INDEPENDENT_AMBULATORY_CARE_PROVIDER_SITE_OTHER): Payer: Self-pay

## 2017-06-27 ENCOUNTER — Ambulatory Visit
Admission: RE | Admit: 2017-06-27 | Discharge: 2017-06-27 | Disposition: A | Discharge status: Home / Self Care | Payer: Medicare Other | Source: Ambulatory Visit | Attending: Vascular Surgery | Admitting: Vascular Surgery

## 2017-06-27 ENCOUNTER — Encounter
Admission: RE | Disposition: A | Discharge status: Home / Self Care | Payer: Self-pay | Source: Ambulatory Visit | Attending: Vascular Surgery

## 2017-06-27 ENCOUNTER — Ambulatory Visit: Admission: RE | Admit: 2017-06-27 | Discharge: 2017-06-27 | Payer: MEDICARE | Attending: Family | Admitting: Family

## 2017-06-27 DIAGNOSIS — I10 Essential (primary) hypertension: Principal | ICD-10-CM

## 2017-06-27 DIAGNOSIS — R7301 Impaired fasting glucose: Secondary | ICD-10-CM

## 2017-06-27 DIAGNOSIS — N186 End stage renal disease: Secondary | ICD-10-CM

## 2017-06-27 DIAGNOSIS — M25562 Pain in left knee: Secondary | ICD-10-CM

## 2017-06-27 DIAGNOSIS — Z8249 Family history of ischemic heart disease and other diseases of the circulatory system: Secondary | ICD-10-CM | POA: Insufficient documentation

## 2017-06-27 DIAGNOSIS — Z87891 Personal history of nicotine dependence: Secondary | ICD-10-CM | POA: Insufficient documentation

## 2017-06-27 DIAGNOSIS — Z9889 Other specified postprocedural states: Secondary | ICD-10-CM | POA: Diagnosis not present

## 2017-06-27 DIAGNOSIS — Z452 Encounter for adjustment and management of vascular access device: Secondary | ICD-10-CM | POA: Insufficient documentation

## 2017-06-27 DIAGNOSIS — I120 Hypertensive chronic kidney disease with stage 5 chronic kidney disease or end stage renal disease: Secondary | ICD-10-CM | POA: Insufficient documentation

## 2017-06-27 DIAGNOSIS — Z992 Dependence on renal dialysis: Secondary | ICD-10-CM | POA: Diagnosis not present

## 2017-06-27 DIAGNOSIS — M109 Gout, unspecified: Secondary | ICD-10-CM | POA: Diagnosis not present

## 2017-06-27 DIAGNOSIS — Z833 Family history of diabetes mellitus: Secondary | ICD-10-CM | POA: Insufficient documentation

## 2017-06-27 HISTORY — PX: DIALYSIS/PERMA CATHETER REMOVAL: CATH118289

## 2017-06-27 SURGERY — DIALYSIS/PERMA CATHETER REMOVAL
Anesthesia: LOCAL

## 2017-06-27 MED ORDER — LIDOCAINE-EPINEPHRINE (PF) 2 %-1:200000 IJ SOLN
INTRAMUSCULAR | Status: AC
  Filled 2017-06-27: qty 20

## 2017-06-27 SURGICAL SUPPLY — 1 items: TRAY LACERAT/PLASTIC (MISCELLANEOUS) ×2 IMPLANT

## 2017-06-27 NOTE — H&P (Signed)
[facility] History & Physical Update  The patient was interviewed and re-examined.  The patient's previous History and Physical has been reviewed and is unchanged.  There is no change in the plan of care. We plan to proceed with the scheduled procedure.  Hortencia Pilar, MD  06/27/2017, 11:47 AM

## 2017-06-27 NOTE — Op Note (Signed)
  OPERATIVE NOTE   PROCEDURE: 1. Removal of a right IJ tunneled dialysis catheter  PRE-OPERATIVE DIAGNOSIS: Complication of dialysis catheter  POST-OPERATIVE DIAGNOSIS: Same  SURGEON: Hortencia Pilar  ANESTHESIA: Local anesthetic with 1% lidocaine with epinephrine   ESTIMATED BLOOD LOSS: Minimal   FINDING(S): 1. Catheter intact   SPECIMEN(S):  Catheter  INDICATIONS:   Matthew Mathis is a 56 y.o. male who presents with working peritoneal dialysis catheter. He no longer needs his tunneled dialysis catheter and this is being removed so that he does not have complications such as sepsis..  DESCRIPTION: After obtaining full informed written consent, the patient was positioned supine. The right IJ tunneled catheter and surrounding area is prepped and draped in a sterile fashion. The cuff was localized by palpation and noted to be greater than 3 cm from the exit site. After appropriate timeout is called, 1% lidocaine with epinephrine is infiltrated into the surrounding tissues around the cuff. Small transverse incision is created with an 11 blade scalpel and the dissection was carried down to expose the cuff of the tunneled catheter.  The catheter is then freed from the surrounding attachments and adhesions. Once the catheter has been freed circumferentially it is transected just distal to the cuff and subsequently removed in 2 pieces. Light pressure was held at the base of the neck. A 4-0 Monocryl was used close the tunnel in the subcutaneous space. The 4-0 Monocryl Monocryl was then used to close the skin in a subcuticular stitch. Dermabond is applied.  Antibiotic ointment and a sterile dressing is applied to the exit site. Patient tolerated procedure well and there were no complications.  COMPLICATIONS: None  CONDITION: Unchanged  Hortencia Pilar. [facility] Office: [phone]  06/27/2017,5:01 PM

## 2017-06-28 ENCOUNTER — Encounter: Payer: Self-pay | Admitting: Vascular Surgery

## 2017-07-04 ENCOUNTER — Ambulatory Visit: Admission: RE | Admit: 2017-07-04 | Discharge: 2017-07-04 | Payer: MEDICARE

## 2017-07-04 DIAGNOSIS — H469 Unspecified optic neuritis: Principal | ICD-10-CM

## 2017-08-16 ENCOUNTER — Ambulatory Visit
Admission: RE | Admit: 2017-08-16 | Discharge: 2017-08-16 | Disposition: A | Discharge status: Home / Self Care | Payer: MEDICARE | Attending: Nephrology | Admitting: Nephrology

## 2017-08-21 DIAGNOSIS — Z7682 Awaiting organ transplant status: Principal | ICD-10-CM

## 2017-08-21 DIAGNOSIS — Z01818 Encounter for other preprocedural examination: Secondary | ICD-10-CM

## 2017-08-21 DIAGNOSIS — N186 End stage renal disease: Secondary | ICD-10-CM

## 2017-09-11 ENCOUNTER — Ambulatory Visit
Admission: RE | Admit: 2017-09-11 | Discharge: 2017-09-11 | Disposition: A | Discharge status: Home / Self Care | Payer: MEDICARE | Attending: Nephrology | Admitting: Nephrology

## 2017-09-11 ENCOUNTER — Ambulatory Visit
Admission: RE | Admit: 2017-09-11 | Discharge: 2017-09-11 | Disposition: A | Discharge status: Home / Self Care | Payer: MEDICARE

## 2017-09-15 DIAGNOSIS — Z7682 Awaiting organ transplant status: Principal | ICD-10-CM

## 2017-09-15 DIAGNOSIS — Z01818 Encounter for other preprocedural examination: Secondary | ICD-10-CM

## 2017-09-15 DIAGNOSIS — N186 End stage renal disease: Secondary | ICD-10-CM

## 2017-09-26 ENCOUNTER — Ambulatory Visit: Admission: RE | Admit: 2017-09-26 | Discharge: 2017-09-26 | Payer: MEDICARE | Attending: Family | Admitting: Family

## 2017-09-26 DIAGNOSIS — M1A0410 Idiopathic chronic gout, right hand, without tophus (tophi): Secondary | ICD-10-CM

## 2017-09-26 DIAGNOSIS — N186 End stage renal disease: Secondary | ICD-10-CM

## 2017-09-26 DIAGNOSIS — I10 Essential (primary) hypertension: Principal | ICD-10-CM

## 2017-09-28 DIAGNOSIS — Z7682 Awaiting organ transplant status: Principal | ICD-10-CM

## 2017-09-28 DIAGNOSIS — Z01818 Encounter for other preprocedural examination: Secondary | ICD-10-CM

## 2017-09-28 DIAGNOSIS — N186 End stage renal disease: Secondary | ICD-10-CM

## 2017-10-06 ENCOUNTER — Ambulatory Visit
Admission: RE | Admit: 2017-10-06 | Discharge: 2017-10-06 | Payer: MEDICARE | Attending: Nephrology | Admitting: Nephrology

## 2017-10-18 DIAGNOSIS — Z01818 Encounter for other preprocedural examination: Secondary | ICD-10-CM

## 2017-10-18 DIAGNOSIS — N186 End stage renal disease: Secondary | ICD-10-CM

## 2017-10-18 DIAGNOSIS — Z7682 Awaiting organ transplant status: Principal | ICD-10-CM

## 2017-12-12 ENCOUNTER — Encounter: Admit: 2017-12-12 | Discharge: 2017-12-12 | Payer: MEDICARE | Attending: Nephrology | Primary: Nephrology

## 2017-12-19 DIAGNOSIS — Z01818 Encounter for other preprocedural examination: Secondary | ICD-10-CM

## 2017-12-19 DIAGNOSIS — Z7682 Awaiting organ transplant status: Principal | ICD-10-CM

## 2017-12-19 DIAGNOSIS — N186 End stage renal disease: Secondary | ICD-10-CM

## 2018-01-12 ENCOUNTER — Encounter: Admit: 2018-01-12 | Discharge: 2018-01-12 | Payer: MEDICARE | Attending: Nephrology | Primary: Nephrology

## 2018-01-12 ENCOUNTER — Ambulatory Visit: Admit: 2018-01-12 | Discharge: 2018-01-13 | Payer: MEDICARE | Attending: Clinical | Primary: Clinical

## 2018-01-12 DIAGNOSIS — Z0000 Encounter for general adult medical examination without abnormal findings: Principal | ICD-10-CM

## 2018-01-19 ENCOUNTER — Ambulatory Visit: Admit: 2018-01-19 | Discharge: 2018-02-01 | Payer: MEDICARE

## 2018-01-19 ENCOUNTER — Ambulatory Visit: Admit: 2018-01-19 | Discharge: 2018-01-19 | Payer: MEDICARE

## 2018-01-19 DIAGNOSIS — Z7289 Other problems related to lifestyle: Secondary | ICD-10-CM

## 2018-01-19 DIAGNOSIS — Z01810 Encounter for preprocedural cardiovascular examination: Secondary | ICD-10-CM

## 2018-01-19 DIAGNOSIS — Z992 Dependence on renal dialysis: Secondary | ICD-10-CM

## 2018-01-19 DIAGNOSIS — N186 End stage renal disease: Secondary | ICD-10-CM

## 2018-01-19 DIAGNOSIS — I120 Hypertensive chronic kidney disease with stage 5 chronic kidney disease or end stage renal disease: Secondary | ICD-10-CM

## 2018-01-19 DIAGNOSIS — Z01818 Encounter for other preprocedural examination: Principal | ICD-10-CM

## 2018-01-24 DIAGNOSIS — N186 End stage renal disease: Secondary | ICD-10-CM

## 2018-01-24 DIAGNOSIS — Z7682 Awaiting organ transplant status: Principal | ICD-10-CM

## 2018-01-24 DIAGNOSIS — Z01818 Encounter for other preprocedural examination: Secondary | ICD-10-CM

## 2018-01-25 ENCOUNTER — Ambulatory Visit: Admit: 2018-01-25 | Discharge: 2018-01-25 | Payer: MEDICARE

## 2018-01-25 ENCOUNTER — Institutional Professional Consult (permissible substitution): Admit: 2018-01-25 | Discharge: 2018-01-25 | Payer: MEDICARE

## 2018-01-25 ENCOUNTER — Ambulatory Visit
Admit: 2018-01-25 | Discharge: 2018-01-25 | Payer: MEDICARE | Attending: Student in an Organized Health Care Education/Training Program | Primary: Student in an Organized Health Care Education/Training Program

## 2018-01-25 DIAGNOSIS — N186 End stage renal disease: Secondary | ICD-10-CM

## 2018-01-25 DIAGNOSIS — Z7289 Other problems related to lifestyle: Secondary | ICD-10-CM

## 2018-01-25 DIAGNOSIS — Z01818 Encounter for other preprocedural examination: Principal | ICD-10-CM

## 2018-01-25 DIAGNOSIS — Z992 Dependence on renal dialysis: Secondary | ICD-10-CM

## 2018-01-25 DIAGNOSIS — Z01810 Encounter for preprocedural cardiovascular examination: Secondary | ICD-10-CM

## 2018-01-25 DIAGNOSIS — Z125 Encounter for screening for malignant neoplasm of prostate: Secondary | ICD-10-CM

## 2018-01-25 DIAGNOSIS — I120 Hypertensive chronic kidney disease with stage 5 chronic kidney disease or end stage renal disease: Secondary | ICD-10-CM

## 2018-01-26 ENCOUNTER — Ambulatory Visit: Admit: 2018-01-26 | Discharge: 2018-01-27 | Payer: MEDICARE | Attending: Family | Primary: Family

## 2018-01-26 DIAGNOSIS — I10 Essential (primary) hypertension: Principal | ICD-10-CM

## 2018-01-26 DIAGNOSIS — N186 End stage renal disease: Secondary | ICD-10-CM

## 2018-01-26 DIAGNOSIS — L299 Pruritus, unspecified: Secondary | ICD-10-CM

## 2018-01-26 DIAGNOSIS — L219 Seborrheic dermatitis, unspecified: Secondary | ICD-10-CM

## 2018-01-29 MED ORDER — KETOCONAZOLE 2 % SHAMPOO
TOPICAL | 5 refills | 0.00000 days | Status: CP
Start: 2018-01-29 — End: 2018-08-23

## 2018-03-05 ENCOUNTER — Institutional Professional Consult (permissible substitution): Admit: 2018-03-05 | Discharge: 2018-03-06 | Payer: MEDICARE

## 2018-03-06 ENCOUNTER — Encounter: Admit: 2018-03-06 | Discharge: 2018-03-06 | Payer: MEDICARE | Attending: Internal Medicine | Primary: Internal Medicine

## 2018-03-14 DIAGNOSIS — Z7682 Awaiting organ transplant status: Principal | ICD-10-CM

## 2018-03-14 DIAGNOSIS — N186 End stage renal disease: Secondary | ICD-10-CM

## 2018-03-14 DIAGNOSIS — Z01818 Encounter for other preprocedural examination: Secondary | ICD-10-CM

## 2018-03-22 ENCOUNTER — Encounter: Admit: 2018-03-22 | Discharge: 2018-03-22 | Payer: MEDICARE | Attending: Surgery | Primary: Surgery

## 2018-03-22 DIAGNOSIS — N186 End stage renal disease: Principal | ICD-10-CM

## 2018-03-22 DIAGNOSIS — Z01818 Encounter for other preprocedural examination: Secondary | ICD-10-CM

## 2018-04-04 ENCOUNTER — Encounter: Admit: 2018-04-04 | Discharge: 2018-04-04 | Payer: MEDICARE | Attending: Specialist | Primary: Specialist

## 2018-04-16 DIAGNOSIS — Z7682 Awaiting organ transplant status: Principal | ICD-10-CM

## 2018-04-16 DIAGNOSIS — Z01818 Encounter for other preprocedural examination: Secondary | ICD-10-CM

## 2018-04-16 DIAGNOSIS — N186 End stage renal disease: Secondary | ICD-10-CM

## 2018-05-03 ENCOUNTER — Other Ambulatory Visit: Payer: Self-pay

## 2018-05-03 ENCOUNTER — Inpatient Hospital Stay: Payer: Medicare Other

## 2018-05-03 ENCOUNTER — Inpatient Hospital Stay
Admission: EM | Admit: 2018-05-03 | Discharge: 2018-05-09 | DRG: 919 | Disposition: A | Discharge status: Home / Self Care | Payer: Medicare Other | Attending: Internal Medicine | Admitting: Internal Medicine

## 2018-05-03 ENCOUNTER — Emergency Department: Payer: Medicare Other

## 2018-05-03 ENCOUNTER — Encounter: Payer: Self-pay | Admitting: Emergency Medicine

## 2018-05-03 DIAGNOSIS — F17210 Nicotine dependence, cigarettes, uncomplicated: Secondary | ICD-10-CM | POA: Diagnosis present

## 2018-05-03 DIAGNOSIS — I120 Hypertensive chronic kidney disease with stage 5 chronic kidney disease or end stage renal disease: Secondary | ICD-10-CM | POA: Diagnosis present

## 2018-05-03 DIAGNOSIS — E875 Hyperkalemia: Secondary | ICD-10-CM | POA: Diagnosis present

## 2018-05-03 DIAGNOSIS — T85691A Other mechanical complication of intraperitoneal dialysis catheter, initial encounter: Secondary | ICD-10-CM | POA: Diagnosis present

## 2018-05-03 DIAGNOSIS — M109 Gout, unspecified: Secondary | ICD-10-CM | POA: Diagnosis present

## 2018-05-03 DIAGNOSIS — Z992 Dependence on renal dialysis: Secondary | ICD-10-CM

## 2018-05-03 DIAGNOSIS — Z841 Family history of disorders of kidney and ureter: Secondary | ICD-10-CM | POA: Diagnosis not present

## 2018-05-03 DIAGNOSIS — N2581 Secondary hyperparathyroidism of renal origin: Secondary | ICD-10-CM | POA: Diagnosis present

## 2018-05-03 DIAGNOSIS — N186 End stage renal disease: Secondary | ICD-10-CM | POA: Diagnosis present

## 2018-05-03 DIAGNOSIS — K56609 Unspecified intestinal obstruction, unspecified as to partial versus complete obstruction: Secondary | ICD-10-CM | POA: Diagnosis present

## 2018-05-03 DIAGNOSIS — K5651 Intestinal adhesions [bands], with partial obstruction: Secondary | ICD-10-CM | POA: Diagnosis not present

## 2018-05-03 DIAGNOSIS — Z833 Family history of diabetes mellitus: Secondary | ICD-10-CM | POA: Diagnosis not present

## 2018-05-03 DIAGNOSIS — D631 Anemia in chronic kidney disease: Secondary | ICD-10-CM | POA: Diagnosis present

## 2018-05-03 DIAGNOSIS — Z8249 Family history of ischemic heart disease and other diseases of the circulatory system: Secondary | ICD-10-CM | POA: Diagnosis not present

## 2018-05-03 DIAGNOSIS — Z4659 Encounter for fitting and adjustment of other gastrointestinal appliance and device: Secondary | ICD-10-CM

## 2018-05-03 LAB — CBC
HEMATOCRIT: 38.3 % — AB (ref 40.0–52.0)
Hemoglobin: 13.4 g/dL (ref 13.0–18.0)
MCH: 34.1 pg — ABNORMAL HIGH (ref 26.0–34.0)
MCHC: 34.9 g/dL (ref 32.0–36.0)
MCV: 97.8 fL (ref 80.0–100.0)
Platelets: 345 10*3/uL (ref 150–440)
RBC: 3.91 MIL/uL — ABNORMAL LOW (ref 4.40–5.90)
RDW: 15.3 % — AB (ref 11.5–14.5)
WBC: 11.9 10*3/uL — AB (ref 3.8–10.6)

## 2018-05-03 LAB — COMPREHENSIVE METABOLIC PANEL
ALBUMIN: 4.5 g/dL (ref 3.5–5.0)
ALT: 20 U/L (ref 17–63)
AST: 21 U/L (ref 15–41)
Alkaline Phosphatase: 64 U/L (ref 38–126)
Anion gap: 26 — ABNORMAL HIGH (ref 5–15)
BUN: 100 mg/dL — AB (ref 6–20)
CHLORIDE: 89 mmol/L — AB (ref 101–111)
CO2: 22 mmol/L (ref 22–32)
Calcium: 8.7 mg/dL — ABNORMAL LOW (ref 8.9–10.3)
Creatinine, Ser: 14.71 mg/dL — ABNORMAL HIGH (ref 0.61–1.24)
GFR calc Af Amer: 4 mL/min — ABNORMAL LOW (ref >60)
GFR, EST NON AFRICAN AMERICAN: 3 mL/min — AB (ref >60)
Glucose, Bld: 171 mg/dL — ABNORMAL HIGH (ref 65–99)
POTASSIUM: 5.7 mmol/L — AB (ref 3.5–5.1)
SODIUM: 137 mmol/L (ref 135–145)
Total Bilirubin: 0.9 mg/dL (ref 0.3–1.2)
Total Protein: 7.6 g/dL (ref 6.5–8.1)

## 2018-05-03 LAB — BODY FLUID CELL COUNT WITH DIFFERENTIAL
Eos, Fluid: 0 %
LYMPHS FL: 4 %
MONOCYTE-MACROPHAGE-SEROUS FLUID: 82 %
NEUTROPHIL FLUID: 14 %
Other Cells, Fluid: 0 %
Total Nucleated Cell Count, Fluid: 36 cu mm

## 2018-05-03 LAB — LIPASE, BLOOD: LIPASE: 21 U/L (ref 11–51)

## 2018-05-03 LAB — LACTIC ACID, PLASMA
LACTIC ACID, VENOUS: 1.8 mmol/L (ref 0.5–1.9)
Lactic Acid, Venous: 1.5 mmol/L (ref 0.5–1.9)

## 2018-05-03 MED ORDER — FENTANYL CITRATE (PF) 100 MCG/2ML IJ SOLN
50.0000 ug | Freq: Once | INTRAMUSCULAR | Status: AC
  Administered 2018-05-03: 50 ug via INTRAVENOUS
  Filled 2018-05-03: qty 2

## 2018-05-03 MED ORDER — VANCOMYCIN HCL 10 G IV SOLR
1750.0000 mg | Freq: Once | INTRAVENOUS | Status: DC
  Filled 2018-05-03: qty 1750

## 2018-05-03 MED ORDER — ALLOPURINOL 100 MG PO TABS
100.0000 mg | ORAL_TABLET | Freq: Every evening | ORAL | Status: DC
  Administered 2018-05-05 – 2018-05-08 (×4): 100 mg via ORAL
  Filled 2018-05-03 (×4): qty 1

## 2018-05-03 MED ORDER — MUPIROCIN 2 % EX OINT
TOPICAL_OINTMENT | Freq: Two times a day (BID) | CUTANEOUS | Status: DC
  Administered 2018-05-04 – 2018-05-07 (×2): via TOPICAL
  Filled 2018-05-03: qty 22

## 2018-05-03 MED ORDER — ONDANSETRON HCL 4 MG PO TABS: 4.0000 mg | ORAL_TABLET | Freq: Four times a day (QID) | ORAL | Status: DC | PRN

## 2018-05-03 MED ORDER — MORPHINE SULFATE (PF) 2 MG/ML IV SOLN
2.0000 mg | INTRAVENOUS | Status: DC | PRN
  Administered 2018-05-03: 2 mg via INTRAVENOUS
  Filled 2018-05-03: qty 1

## 2018-05-03 MED ORDER — VANCOMYCIN HCL 10 G IV SOLR
1250.0000 mg | Freq: Once | INTRAVENOUS | Status: DC
  Filled 2018-05-03: qty 1250

## 2018-05-03 MED ORDER — ACETAMINOPHEN 650 MG RE SUPP
650.0000 mg | Freq: Four times a day (QID) | RECTAL | Status: DC | PRN
Start: 2018-05-03 — End: 2018-05-09

## 2018-05-03 MED ORDER — SODIUM CHLORIDE 0.9 % IV BOLUS (SEPSIS)
1000.0000 mL | Freq: Once | INTRAVENOUS | Status: AC
  Administered 2018-05-03: 1000 mL via INTRAVENOUS

## 2018-05-03 MED ORDER — SODIUM BICARBONATE 8.4 % IV SOLN
25.0000 meq | Freq: Once | INTRAVENOUS | Status: AC
  Administered 2018-05-03: 25 meq via INTRAVENOUS
  Filled 2018-05-03: qty 50

## 2018-05-03 MED ORDER — ONDANSETRON HCL 4 MG/2ML IJ SOLN: 4.0000 mg | Freq: Four times a day (QID) | INTRAMUSCULAR | Status: DC | PRN

## 2018-05-03 MED ORDER — HEPARIN SODIUM (PORCINE) 5000 UNIT/ML IJ SOLN
5000.0000 [IU] | Freq: Three times a day (TID) | INTRAMUSCULAR | Status: DC
  Administered 2018-05-03 – 2018-05-09 (×12): 5000 [IU] via SUBCUTANEOUS
  Filled 2018-05-03 (×12): qty 1

## 2018-05-03 MED ORDER — ACETAMINOPHEN 325 MG PO TABS
650.0000 mg | ORAL_TABLET | Freq: Four times a day (QID) | ORAL | Status: DC | PRN
  Administered 2018-05-07 – 2018-05-08 (×2): 650 mg via ORAL
  Filled 2018-05-03 (×2): qty 2

## 2018-05-03 MED ORDER — SODIUM CHLORIDE 0.9 % IV SOLN
2.0000 g | INTRAVENOUS | Status: AC
  Administered 2018-05-03: 2 g via INTRAVENOUS
  Filled 2018-05-03: qty 2

## 2018-05-03 MED ORDER — VANCOMYCIN HCL 10 G IV SOLR
2000.0000 mg | Freq: Once | INTRAVENOUS | Status: AC
  Administered 2018-05-04: 2000 mg via INTRAVENOUS
  Filled 2018-05-03: qty 2000

## 2018-05-03 MED ORDER — SODIUM CHLORIDE 0.9 % IV SOLN
1.0000 g | Freq: Once | INTRAVENOUS | Status: AC
  Administered 2018-05-03: 1 g via INTRAVENOUS
  Filled 2018-05-03: qty 10

## 2018-05-03 MED ORDER — SODIUM CHLORIDE 0.9 % IV SOLN
2.0000 g | INTRAVENOUS | Status: DC
  Administered 2018-05-05: 2 g via INTRAVENOUS
  Filled 2018-05-03: qty 2

## 2018-05-03 MED ORDER — INSULIN ASPART 100 UNIT/ML ~~LOC~~ SOLN
5.0000 [IU] | Freq: Once | SUBCUTANEOUS | Status: AC
  Administered 2018-05-03: 5 [IU] via INTRAVENOUS
  Filled 2018-05-03: qty 1

## 2018-05-03 MED ORDER — DEXTROSE 50 % IV SOLN
1.0000 | Freq: Once | INTRAVENOUS | Status: AC
  Administered 2018-05-03: 50 mL via INTRAVENOUS
  Filled 2018-05-03: qty 50

## 2018-05-03 MED ORDER — CALCIUM GLUCONATE 10 % IV SOLN
INTRAVENOUS | Status: AC
  Filled 2018-05-03: qty 10

## 2018-05-03 NOTE — Progress Notes (Signed)
MD at bedside, Pt received 1000 ml, per Dr.Lateef's verbal orders, we will let that dwell for 1hr before removing for cell count and culture.   05/03/18 1730  Initial Treatment  Volume in Prior to Admission (mL) 0 kg  First Volume Out (mL) 0 kg  Peritoneal Dialysis - Fill  Fill Exchange Number 1  Dianeal Solution Dextrose 2.5% in 2000 mL  Peritoneal Dialysis Volume In (mL) 1000 ml  Hand-Off documentation  Report given to (Full Name) Stark Bray  Report received from (Full Name) Luvenia Heller

## 2018-05-03 NOTE — ED Notes (Signed)
Pt getting a bedside procedure (sterile procedure).

## 2018-05-03 NOTE — ED Provider Notes (Signed)
[facility] Emergency Department Provider Note   ____________________________________________   First MD Initiated Contact with Patient 05/03/18 1530     (approximate)  I have reviewed the triage vital signs and the nursing notes.   HISTORY  Chief Complaint Abdominal Pain   HPI Matthew Mathis is a 57 y.o. male presents for evaluation of nausea vomiting abdominal pain and loose stools  Patient reports he had about 3 days of nausea and vomiting.  He has not been able to keep a food diary food he eats comes back up.  No recent travel.  Reports his stomach is been hurting throughout, hard to describe but a moderate pain.  Has not gotten much better.  Yesterday he began to have some occasional loose somewhat watery nonbloody stools.  Continues to have inability to keep food down due to abdominal pain and vomiting.  No fever.  He did do his peritoneal dialysis yesterday without difficulty.  He has not noticed any cloudiness of the fluid.  Reports moderate pain all over his abdomen.  Associate with nausea and vomiting.  Reports similar once in the past where he had a bowel blockage which he reports he was treated with a tube down his nose and admitted to [facility].  Past Medical History:  Diagnosis Date  . Chronic renal failure    hx   . Gout    hx  . HTN (hypertension)     Patient Active Problem List   Diagnosis Date Noted  . Bowel obstruction ([place]) 05/03/2018  . End stage renal disease ([place]) 05/08/2017  . Complication of renal dialysis device 05/08/2017  . HYPERTENSION, BENIGN 10/01/2010  . CHEST PAIN UNSPECIFIED 10/01/2010    Past Surgical History:  Procedure Laterality Date  . CAPD INSERTION Right 05/24/2017   Procedure: LAPAROSCOPIC INSERTION CONTINUOUS AMBULATORY PERITONEAL DIALYSIS  (CAPD) CATHETER;  Surgeon: Katha Cabal, MD;  Location: [facility] ORS;  Service: Vascular;  Laterality: Right;  . DIALYSIS/PERMA CATHETER REMOVAL N/A  06/27/2017   Procedure: Dialysis/Perma Catheter Removal;  Surgeon: Katha Cabal, MD;  Location: [place] CV [facility];  Service: Cardiovascular;  Laterality: N/A;  . INSERTION OF DIALYSIS CATHETER Right 05/24/2017   Procedure: INSERTION OF DIALYSIS CATHETER ( TUNNELED CATH );  Surgeon: Katha Cabal, MD;  Location: [facility] ORS;  Service: Vascular;  Laterality: Right;  . PERITONEAL CATHETER INSERTION    . REMOVAL OF A DIALYSIS CATHETER Left 05/24/2017   Procedure: REMOVAL OF A DIALYSIS CATHETER ( PD CATH );  Surgeon: Katha Cabal, MD;  Location: [facility] ORS;  Service: Vascular;  Laterality: Left;  . RENAL BIOPSY     non specific     Prior to Admission medications   Medication Sig Start Date End Date Taking? Authorizing Provider  allopurinol (ZYLOPRIM) 100 MG tablet Take 100 mg by mouth every evening.  07/25/16  Yes [provider]  benazepril (LOTENSIN) 10 MG tablet Take 10 mg by mouth daily.   Yes [provider]  metoprolol succinate (TOPROL-XL) 25 MG 24 hr tablet Take 25 mg by mouth daily.    Yes [provider]  Multiple Vitamins-Minerals (DIALYVITE 800/ULTRA D) TABS Take 1 tablet by mouth daily. 05/22/17  Yes [provider]  mupirocin ointment (BACTROBAN) 2 % APPLY A SMALL AMOUNT TO SKIN ONCE A DAY AS NEEDED APPLY TO EXIT SITE WITH DRESSING CHANGES 05/15/17  Yes [provider]  calcitRIOL (ROCALTROL) 0.5 MCG capsule Take 0.5 mcg by mouth daily after lunch.  [provider]  calcium acetate (PHOSLO) 667 MG capsule Take 667-1,334 mg by mouth 3 (three) times daily. Take 2 capsules (1,334mg ) three times a day and 1 capsule with snacks.    [provider]  cinacalcet (SENSIPAR) 90 MG tablet Take 90 mg by mouth at bedtime.    [provider]  enalapril (VASOTEC) 5 MG tablet Take 5 mg by mouth daily.    [provider]  fluocinonide (LIDEX) 0.05 % external solution Apply 1 application topically daily.     [provider]  folic acid-vitamin b complex-vitamin c-selenium-zinc (DIALYVITE) 3 MG TABS tablet Take 1 tablet by mouth at bedtime.     [provider]  furosemide (LASIX) 80 MG tablet Take 80 mg by mouth daily after lunch.     [provider]  HYDROcodone-acetaminophen (NORCO) 5-325 MG tablet Take 1-2 tablets by mouth every 6 (six) hours as needed for moderate pain or severe pain. 05/24/17   Schnier, Dolores Lory, MD  irbesartan (AVAPRO) 150 MG tablet Take 150 mg by mouth daily.    [provider]    Allergies Patient has no known allergies.  Family History  Problem Relation Age of Onset  . Diabetes Father   . Hypertension Mother        brother, sister     Social History Social History   Tobacco Use  . Smoking status: Former Smoker    Packs/day: 0.50    Types: Cigarettes    Last attempt to quit: 12/04/2010    Years since quitting: 7.4  . Smokeless tobacco: Never Used  . Tobacco comment: used to smoke 2-3 ppd, now smokes 3 cigarettes daily   Substance Use Topics  . Alcohol use: No  . Drug use: No    Review of Systems Constitutional: No fever/chills.  Feels lightheaded.  Weak all over Eyes: No visual changes. ENT: No sore throat. Cardiovascular: Denies chest pain. Respiratory: Denies shortness of breath. Gastrointestinal:  No constipation. Genitourinary: Negative for dysuria. Musculoskeletal: Negative for back pain. Skin: Negative for rash. Neurological: Negative for headaches, focal weakness or numbness.    ____________________________________________   PHYSICAL EXAM:  VITAL SIGNS: ED Triage Vitals  Enc Vitals Group     BP 05/03/18 1517 (!) 78/55     Pulse Rate 05/03/18 1517 (!) 112     Resp 05/03/18 1517 20     Temp 05/03/18 1517 97.8 F (36.6 C)     Temp Source 05/03/18 1517 Oral     SpO2 05/03/18 1517 99 %     Weight 05/03/18 1520 170 lb (77.1 kg)     Height 05/03/18 1520 5\' 2"  (1.575 m)     Head Circumference --       Peak Flow --      Pain Score 05/03/18 1519 5     Pain Loc --      Pain Edu? --      Excl. in [place]? --     Constitutional: Alert and oriented.  Patient appears acutely ill.  Pale.  Slightly diaphoretic.  Blood pressure is noted to be in the low 93X systolic, of note he had a initial systolic pressure less than 80. Eyes: Conjunctivae are normal. Head: Atraumatic. Nose: No congestion/rhinnorhea. Mouth/Throat: Mucous membranes are very dry. Neck: No stridor.   Cardiovascular: Normal rate, regular rhythm. Grossly normal heart sounds.  Good peripheral circulation. Respiratory: Normal respiratory effort.  No retractions. Lungs CTAB. Gastrointestinal: Soft and mildly tender throughout.  There is questionably some  slight peritonitis throughout.  Does not appear distended.  Peritoneal dialysis catheter site appears clean dry and intact. Musculoskeletal: No lower extremity tenderness nor edema. Neurologic:  Normal speech and language. No gross focal neurologic deficits are appreciated.  Skin:  Skin is warm, dry and intact. No rash noted. Psychiatric: Mood and affect are normal. Speech and behavior are normal.  ____________________________________________   LABS (all labs ordered are listed, but only abnormal results are displayed)  Labs Reviewed  COMPREHENSIVE METABOLIC PANEL - Abnormal; Notable for the following components:      Result Value   Potassium 5.7 (*)    Chloride 89 (*)    Glucose, Bld 171 (*)    BUN 100 (*)    Creatinine, Ser 14.71 (*)    Calcium 8.7 (*)    GFR calc non Af Amer 3 (*)    GFR calc Af Amer 4 (*)    Anion gap 26 (*)    All other components within normal limits  CBC - Abnormal; Notable for the following components:   WBC 11.9 (*)    RBC 3.91 (*)    HCT 38.3 (*)    MCH 34.1 (*)    RDW 15.3 (*)    All other components within normal limits  CULTURE, BLOOD (ROUTINE X 2)  CULTURE, BLOOD (ROUTINE X 2)  BODY FLUID CULTURE  GASTROINTESTINAL PANEL BY PCR, STOOL  (REPLACES STOOL CULTURE)  C DIFFICILE QUICK SCREEN W PCR REFLEX  LIPASE, BLOOD  LACTIC ACID, PLASMA  URINALYSIS, COMPLETE (UACMP) WITH MICROSCOPIC  LACTIC ACID, PLASMA  URINALYSIS, ROUTINE W REFLEX MICROSCOPIC  BODY FLUID CELL COUNT WITH DIFFERENTIAL   ____________________________________________  EKG  Reviewed enterotomy at 1525 Heart rate 110 Cures 79 QTC 460 Sinus tachycardia, no evidence of acute ischemia ____________________________________________  RADIOLOGY  CT scan result reviewed, most notable for concern for bowel obstruction ____________________________________________   PROCEDURES  Procedure(s) performed: None  Procedures   Critical Care performed: Yes, see critical care note(s)   CRITICAL CARE Performed by: Delman Kitten   Total critical care time: 60 minutes  Critical care time was exclusive of separately billable procedures and treating other patients.  Critical care was necessary to treat or prevent imminent or life-threatening deterioration.  Critical care was time spent personally by me on the following activities: development of treatment plan with patient and/or surrogate as well as nursing, discussions with consultants, evaluation of patient's response to treatment, examination of patient, obtaining history from patient or surrogate, ordering and performing treatments and interventions, ordering and review of laboratory studies, ordering and review of radiographic studies, pulse oximetry and re-evaluation of patient's condition.  Patient initially presents and what appears to be likely hypovolemic shock.  Hypotension, tachycardia, pale diaphoretic in appearance with associated nausea vomiting and abdominal pain.  ____________________________________________   INITIAL IMPRESSION / ASSESSMENT AND PLAN / ED COURSE  Pertinent labs & imaging results that were available during my care of the patient were reviewed by me and considered in my medical  decision making (see chart for details).  Differential diagnosis includes but is not limited to, abdominal perforation, aortic dissection, cholecystitis, appendicitis, diverticulitis, colitis, esophagitis/gastritis, kidney stone, pyelonephritis, urinary tract infection, aortic aneurysm. All are considered in decision and reatment plan. Based upon the patient's presentation and risk factors, plan to proceed with CT scan to further evaluate.  Elevated risk for bacterial peritonitis, also felt high risk for bowel obstruction given his presentation today.  ----------------------------------------- 5:28 PM on 05/03/2018 -----------------------------------------  Patient improved.  Vital signs improved.  Reassessment completed.  Patient mentating better.  He is no longer pale.  He is alert.  Ports feeling improvement.  Have initiated treatment for hyperkalemia  Consults have been placed with general surgery, vascular surgery, nephrology over the course of his ED visit.  Anticipate evaluations by each service.  Dr. Rosana Hoes currently with the patient from general surgery, Dr. Delana Meyer is aware.  Dr. Zollie Scale aware and following his recommendations for treatment of hyperkalemia and possible hemodialysis needs.  Patient be admitted to the hospital by Dr. Bobbye Charleston     ____________________________________________   FINAL CLINICAL IMPRESSION(S) / ED DIAGNOSES  Final diagnoses:  Hyperkalemia  End stage renal disease ([place])  Small bowel obstruction ([place])  Rule Out Bacteria Peritonitis    NEW MEDICATIONS STARTED DURING THIS VISIT:  New Prescriptions   No medications on file     Note:  This document was prepared using Dragon voice recognition software and may include unintentional dictation errors.     Delman Kitten, MD 05/03/18 1731

## 2018-05-03 NOTE — Consult Note (Signed)
[facility] Vascular Consult Note  MRN : [id number]  Matthew Mathis is a 57 y.o. ([birth year]) male who presents with chief complaint of  Chief Complaint  Patient presents with  . Abdominal Pain  .  History of Present Illness:   I am asked to evaluate the patient by Loletha Grayer, MD for urgent dialysis access.  The patient is a 57 year old gentleman known to my service who is done very well with peritoneal dialysis.  This morning he woke up with abrupt abdominal pain associated with nausea and vomiting.  He came to the emergency room and was found to have a small bowel obstruction with what appears to be a loop entangled with the PD catheter.  This is happened once before and was treated conservatively at [facility] successfully.  Patient notes that he did do peritoneal dialysis last night without incident.  Today would be the first session he would miss.  Current Facility-Administered Medications  Medication Dose Route Frequency Provider Last Rate Last Dose  . acetaminophen (TYLENOL) tablet 650 mg  650 mg Oral Q6H PRN Loletha Grayer, MD       Or  . acetaminophen (TYLENOL) suppository 650 mg  650 mg Rectal Q6H PRN Loletha Grayer, MD      . Derrill Memo ON 05/05/2018] ceFEPIme (MAXIPIME) 2 g in sodium chloride 0.9 % 100 mL IVPB  2 g Intravenous Q48H Wieting, Richard, MD      . heparin injection 5,000 Units  5,000 Units Subcutaneous Q8H Wieting, Richard, MD      . morphine 2 MG/ML injection 2 mg  2 mg Intravenous Q3H PRN Wieting, Richard, MD      . ondansetron (ZOFRAN) tablet 4 mg  4 mg Oral Q6H PRN Wieting, Richard, MD       Or  . ondansetron (ZOFRAN) injection 4 mg  4 mg Intravenous Q6H PRN Wieting, Richard, MD      . vancomycin (VANCOCIN) 2,000 mg in sodium chloride 0.9 % 500 mL IVPB  2,000 mg Intravenous Once Loletha Grayer, MD       Current Outpatient Medications  Medication Sig Dispense Refill  . allopurinol (ZYLOPRIM) 100 MG tablet Take 100 mg by mouth every  evening.     . benazepril (LOTENSIN) 10 MG tablet Take 10 mg by mouth daily.    . calcium acetate (PHOSLO) 667 MG capsule Take 667-1,334 mg by mouth 3 (three) times daily. Take 2 capsules (1,334mg ) three times a day and 1 capsule with snacks.    . cinacalcet (SENSIPAR) 90 MG tablet Take 90 mg by mouth at bedtime.    . furosemide (LASIX) 80 MG tablet Take 80 mg by mouth daily.    [name] ketoconazole (NIZORAL) 2 % shampoo Apply 1 application topically 2 (two) times a week.  5  . metoprolol succinate (TOPROL-XL) 25 MG 24 hr tablet Take 25 mg by mouth daily.     . Multiple Vitamins-Minerals (DIALYVITE 800/ULTRA D) TABS Take 1 tablet by mouth daily.  3  . mupirocin ointment (BACTROBAN) 2 % APPLY A SMALL AMOUNT TO SKIN ONCE A DAY AS NEEDED APPLY TO EXIT SITE WITH DRESSING CHANGES  99  . HYDROcodone-acetaminophen (NORCO) 5-325 MG tablet Take 1-2 tablets by mouth every 6 (six) hours as needed for moderate pain or severe pain. (Patient not taking: Reported on 05/03/2018) 50 tablet 0    Past Medical History:  Diagnosis Date  . Chronic renal failure    hx   . Gout    hx  .  HTN (hypertension)     Past Surgical History:  Procedure Laterality Date  . CAPD INSERTION Right 05/24/2017   Procedure: LAPAROSCOPIC INSERTION CONTINUOUS AMBULATORY PERITONEAL DIALYSIS  (CAPD) CATHETER;  Surgeon: Katha Cabal, MD;  Location: [facility] ORS;  Service: Vascular;  Laterality: Right;  . DIALYSIS/PERMA CATHETER REMOVAL N/A 06/27/2017   Procedure: Dialysis/Perma Catheter Removal;  Surgeon: Katha Cabal, MD;  Location: [place] [facility];  Service: Cardiovascular;  Laterality: N/A;  . INSERTION OF DIALYSIS CATHETER Right 05/24/2017   Procedure: INSERTION OF DIALYSIS CATHETER ( TUNNELED CATH );  Surgeon: Katha Cabal, MD;  Location: [facility] ORS;  Service: Vascular;  Laterality: Right;  . PERITONEAL CATHETER INSERTION    . REMOVAL OF A DIALYSIS CATHETER Left 05/24/2017   Procedure: REMOVAL OF A DIALYSIS CATHETER ( PD  CATH );  Surgeon: Katha Cabal, MD;  Location: [facility] ORS;  Service: Vascular;  Laterality: Left;  . RENAL BIOPSY     non specific     Social History Social History   Tobacco Use  . Smoking status: Former Smoker    Packs/day: 0.50    Types: Cigarettes    Last attempt to quit: 12/04/2010    Years since quitting: 7.4  . Smokeless tobacco: Never Used  . Tobacco comment: used to smoke 2-3 ppd, now smokes 3 cigarettes daily   Substance Use Topics  . Alcohol use: No  . Drug use: No    Family History Family History  Problem Relation Age of Onset  . Diabetes Father   . CAD Father   . Hypertension Mother        brother, sister   . Kidney disease Mother   No family history of bleeding/clotting disorders, porphyria or autoimmune disease   No Known Allergies   REVIEW OF SYSTEMS (Negative unless checked)  Constitutional: [] Weight loss  [] Fever  [] Chills Cardiac: [] Chest pain   [] Chest pressure   [] Palpitations   [] Shortness of breath at rest   [] Shortness of breath with exertion. Vascular:  [] Pain in legs with walking   [] Pain in legs at rest  [] History of DVT   [] Phlebitis   [] Swelling in legs   [] Varicose veins   [] Non-healing ulcers Pulmonary:   [] Uses home oxygen   [] Productive cough   [] Hemoptysis   [] Wheeze  [] COPD   [] Asthma Neurologic:  [] Dizziness  [] Blackouts   [] Seizures   [] History of stroke   [] History of TIA  [] Aphasia   [] Temporary blindness   [] Dysphagia   [] Weakness or numbness in arms   [] Weakness or numbness in legs Musculoskeletal:  [] Arthritis   [] Joint swelling   [] Joint pain   [] Low back pain Hematologic:  [] Easy bruising    [] Hypercoagulable state   [] Anemic  [] Hepatitis Gastrointestinal:  [] Blood in stool   [x] Vomiting  [] Gastroesophageal reflux/heartburn   [] Difficulty swallowing. Genitourinary:  [x] Chronic kidney disease   [] Difficult urination  [] Frequent urination  [] Burning with urination   [] Blood in urine Skin:  [] Rashes   [] Ulcers    Psychological:  [] History of anxiety   []  History of major depression.    Physical Examination  Vitals:   05/03/18 1740 05/03/18 1743 05/03/18 1800 05/03/18 1935  BP: 119/63 119/63 124/75   Pulse:  86 99 97  Resp: (!) 22 20 20 16   Temp:      TempSrc:      SpO2:  100% 98% 99%  Weight:      Height:       Body mass index is  31.09 kg/m.  Head: [place]/AT, No temporalis wasting.  Ear/Nose/Throat: Nares w/o erythema or drainage, oropharynx mucus membranes moist Eyes: PERRLA, Sclera nonicteric.  Neck: Supple,   No JVD.  Pulmonary:  Breath sounds equal bilaterally, no use of accessory muscles.  Cardiac: RRR, normal S1, S2,  Vascular: 2+ radial and femoral pulses bilaterally Gastrointestinal: soft, tender, mild distended.  PD catheter clean dry and intact right lower quadrant Musculoskeletal: Moves all extremities.  No deformity or atrophy. No edema. Neurologic: 5/5 motor sensory, face appears symmetric speech fluent  Psychiatric: Appropriate mood for situation. Dermatologic: No rashes or ulcers noted.  No cellulitis or open wounds. Lymph : No Cervical,  or Inguinal lymphadenopathy.      CBC Lab Results  Component Value Date   WBC 11.9 (H) 05/03/2018   HGB 13.4 05/03/2018   HCT 38.3 (L) 05/03/2018   MCV 97.8 05/03/2018   PLT 345 05/03/2018    BMET    Component Value Date/Time   NA 137 05/03/2018 1521   NA 136 01/02/2013 1611   K 5.7 (H) 05/03/2018 1521   K 3.4 (L) 01/02/2013 1611   CL 89 (L) 05/03/2018 1521   CL 99 01/02/2013 1611   CO2 22 05/03/2018 1521   CO2 25 01/02/2013 1611   GLUCOSE 171 (H) 05/03/2018 1521   GLUCOSE 99 01/02/2013 1611   BUN 100 (H) 05/03/2018 1521   BUN 71 (H) 01/02/2013 1611   CREATININE 14.71 (H) 05/03/2018 1521   CREATININE 10.21 (H) 01/02/2013 1611   CALCIUM 8.7 (L) 05/03/2018 1521   CALCIUM 8.5 01/02/2013 1611   GFRNONAA 3 (L) 05/03/2018 1521   GFRNONAA 5 (L) 01/02/2013 1611   GFRAA 4 (L) 05/03/2018 1521   GFRAA 6 (L) 01/02/2013  1611   Estimated Creatinine Clearance: 5 mL/min (A) (by C-G formula based on SCr of 14.71 mg/dL (H)).  COAG Lab Results  Component Value Date   INR 0.99 05/18/2017   INR 0.9 01/02/2013      Assessment/Plan 1.  End-stage renal disease requiring hemodialysis: The patient will not be able to do peritoneal dialysis given the small bowel obstruction.  Therefore I will place a temporary catheter for urgent dialysis as he is markedly hyperkalemic.  Risks and benefits are reviewed patient is agreed to proceed.  2.  Small bowel obstruction: Patient is seen by general surgery.  NG tube is been placed attempt will be made a conservative management.  3.  Hyperkalemia: Patient has critical hyperkalemia and therefore temporally dialysis catheters placed urgently and he will undergo dialysis.   Hortencia Pilar, MD  05/03/2018 7:48 PM

## 2018-05-03 NOTE — ED Notes (Signed)
Pt to ct before 2nd set of blood cultures drawn. Medication not started yet.

## 2018-05-03 NOTE — H&P (Signed)
[facility] [place] NAME: Matthew Mathis    MR#:  [id number]  DATE OF BIRTH:  01-13-1961  DATE OF ADMISSION:  05/03/2018  PRIMARY CARE PHYSICIAN: [facility]   REQUESTING/REFERRING PHYSICIAN: Dr Delman Kitten  CHIEF COMPLAINT:   Chief Complaint  Patient presents with  . Abdominal Pain    HISTORY OF PRESENT ILLNESS:  Matthew Mathis  is a 57 y.o. male with a known history of end-stage renal disease on peritoneal dialysis.  The patient's been having abdominal pain since Tuesday.  Patient states the pain is 5 out of 10 intensity feels sharp at times.  It comes and goes.  Gets worse during the day and better at night when he is doing the peritoneal dialysis.  He did have a bowel movement yesterday.  The last time he passed gas was on Sunday.  Patient has had vomiting 10 times.  In the ER, he was found to have a bowel obstruction.  Case discussed with general surgery Dr. Rosana Hoes and he recommended not using the peritoneal dialysis catheter because the bowel obstruction is in that area.  Hospitalist services were contacted for further evaluation.  She is doing okay  PAST MEDICAL HISTORY:   Past Medical History:  Diagnosis Date  . Chronic renal failure    hx   . Gout    hx  . HTN (hypertension)     PAST SURGICAL HISTORY:   Past Surgical History:  Procedure Laterality Date  . CAPD INSERTION Right 05/24/2017   Procedure: LAPAROSCOPIC INSERTION CONTINUOUS AMBULATORY PERITONEAL DIALYSIS  (CAPD) CATHETER;  Surgeon: Katha Cabal, MD;  Location: [facility];  Service: Vascular;  Laterality: Right;  . DIALYSIS/PERMA CATHETER REMOVAL N/A 06/27/2017   Procedure: Dialysis/Perma Catheter Removal;  Surgeon: Katha Cabal, MD;  Location: [place] [facility];  Service: Cardiovascular;  Laterality: N/A;  . INSERTION OF DIALYSIS CATHETER Right 05/24/2017   Procedure: INSERTION OF DIALYSIS CATHETER ( TUNNELED CATH );  Surgeon: Katha Cabal, MD;  Location: [facility];  Service: Vascular;  Laterality: Right;  . PERITONEAL CATHETER INSERTION    . REMOVAL OF A DIALYSIS CATHETER Left 05/24/2017   Procedure: REMOVAL OF A DIALYSIS CATHETER ( PD CATH );  Surgeon: Katha Cabal, MD;  Location: [facility];  Service: Vascular;  Laterality: Left;  . RENAL BIOPSY     non specific     SOCIAL HISTORY:   Social History   Tobacco Use  . Smoking status: Former Smoker    Packs/day: 0.50    Types: Cigarettes    Last attempt to quit: 12/04/2010    Years since quitting: 7.4  . Smokeless tobacco: Never Used  . Tobacco comment: used to smoke 2-3 ppd, now smokes 3 cigarettes daily   Substance Use Topics  . Alcohol use: No    FAMILY HISTORY:   Family History  Problem Relation Age of Onset  . Diabetes Father   . CAD Father   . Hypertension Mother        brother, sister   . Kidney disease Mother     DRUG ALLERGIES:  No Known Allergies  REVIEW OF SYSTEMS:  CONSTITUTIONAL: No fever, fatigue or weakness.  EYES: No blurred or double vision.  EARS, NOSE, AND THROAT: No tinnitus or ear pain. No sore throat RESPIRATORY: Positive for cough. No shortness of breath, wheezing or hemoptysis.  CARDIOVASCULAR: No chest pain, orthopnea, edema.  GASTROINTESTINAL: Positive for nausea, vomiting, and abdominal pain.  No blood in bowel movements GENITOURINARY: No dysuria, hematuria.  ENDOCRINE: No polyuria, nocturia,  HEMATOLOGY: No anemia, easy bruising or bleeding SKIN: No rash or lesion.  Positive for itching MUSCULOSKELETAL: No joint pain or arthritis.   NEUROLOGIC: No tingling, numbness, weakness.  PSYCHIATRY: No anxiety or depression.   MEDICATIONS AT HOME:   Prior to Admission medications   Medication Sig Start Date End Date Taking? Authorizing Provider  allopurinol (ZYLOPRIM) 100 MG tablet Take 100 mg by mouth every evening.  07/25/16  Yes [provider]  benazepril (LOTENSIN) 10 MG tablet Take 10 mg by mouth daily.   Yes  [provider]  ketoconazole (NIZORAL) 2 % shampoo Apply 1 application topically 2 (two) times a week. 02/25/18  Yes [provider]  metoprolol succinate (TOPROL-XL) 25 MG 24 hr tablet Take 25 mg by mouth daily.    Yes [provider]  Multiple Vitamins-Minerals (DIALYVITE 800/ULTRA D) TABS Take 1 tablet by mouth daily. 05/22/17  Yes [provider]  mupirocin ointment (BACTROBAN) 2 % APPLY A SMALL AMOUNT TO SKIN ONCE A DAY AS NEEDED APPLY TO EXIT SITE WITH DRESSING CHANGES 05/15/17  Yes [provider]  calcitRIOL (ROCALTROL) 0.5 MCG capsule Take 0.5 mcg by mouth daily after lunch.     [provider]  calcium acetate (PHOSLO) 667 MG capsule Take 667-1,334 mg by mouth 3 (three) times daily. Take 2 capsules (1,334mg ) three times a day and 1 capsule with snacks.    [provider]  cinacalcet (SENSIPAR) 90 MG tablet Take 90 mg by mouth at bedtime.    [provider]  enalapril (VASOTEC) 5 MG tablet Take 5 mg by mouth daily.    [provider]  furosemide (LASIX) 80 MG tablet Take 80 mg by mouth daily after lunch.     [provider]  HYDROcodone-acetaminophen (NORCO) 5-325 MG tablet Take 1-2 tablets by mouth every 6 (six) hours as needed for moderate pain or severe pain. 05/24/17   Schnier, Dolores Lory, MD  irbesartan (AVAPRO) 150 MG tablet Take 150 mg by mouth daily.    [provider]   Medication reconciliation still undergoing  VITAL SIGNS:  Blood pressure 105/78, pulse 97, temperature 97.8 F (36.6 C), temperature source Oral, resp. rate (!) 22, height 5\' 2"  (1.575 m), weight 77.1 kg (170 lb), SpO2 96 %.  PHYSICAL EXAMINATION:  GENERAL:  57 y.o.-year-old patient lying in the bed with no acute distress.  EYES: Pupils equal, round, reactive to light and accommodation. No scleral icterus. Extraocular muscles intact.  HEENT: Head atraumatic, normocephalic. Oropharynx and nasopharynx clear.  NECK:   Supple, no jugular venous distention. No thyroid enlargement, no tenderness.  LUNGS: Normal breath sounds bilaterally, no wheezing, rales,rhonchi or crepitation. No use of accessory muscles of respiration.  CARDIOVASCULAR: S1, S2 normal. No murmurs, rubs, or gallops.  ABDOMEN: Soft, tenderness entire abdomen, distended. Bowel sounds decreased. No organomegaly or mass.  EXTREMITIES: No pedal edema, cyanosis, or clubbing.  NEUROLOGIC: Cranial nerves II through XII are intact. Muscle strength 5/5 in all extremities. Sensation intact. Gait not checked.  PSYCHIATRIC: The patient is alert and oriented x 3.  SKIN: No rash, lesion, or ulcer.   LABORATORY PANEL:   CBC Recent Labs  Lab 05/03/18 1521  WBC 11.9*  HGB 13.4  HCT 38.3*  PLT 345   ------------------------------------------------------------------------------------------------------------------  Chemistries  Recent Labs  Lab 05/03/18 1521  NA 137  K 5.7*  CL 89*  CO2 22  GLUCOSE 171*  BUN 100*  CREATININE 14.71*  CALCIUM 8.7*  AST 21  ALT 20  ALKPHOS 64  BILITOT 0.9   ------------------------------------------------------------------------------------------------------------------  RADIOLOGY:  Ct Abdomen Pelvis Wo Contrast  Result Date: 05/03/2018 CLINICAL DATA:  Nausea and vomiting. EXAM: CT ABDOMEN AND PELVIS WITHOUT CONTRAST TECHNIQUE: Multidetector CT imaging of the abdomen and pelvis was performed following the standard protocol without IV contrast. COMPARISON:  01/02/2013 FINDINGS: Lower chest: No acute finding. Coronary calcification. Partially covered aortic valve calcification. Hepatobiliary: No focal liver abnormality.No evidence of biliary obstruction or stone. Pancreas: Unremarkable. Spleen: Unremarkable. Adrenals/Urinary Tract: Negative adrenals. Bilateral renal atrophy with cysts and parenchymal/arterial calcification. No hydronephrosis. Unremarkable bladder. Stomach/Bowel: Dilated small bowel with fluid  levels leading to a transition point in the pelvis where there is clustering and distortion of bowel loops. On coronal reformats the bowel appears somewhat encapsulated with smooth and thin peritoneal thickening. The terminal ileum and colon is decompressed. No evidence of pneumatosis. No pericecal inflammation. Vascular/Lymphatic: Diffuse atherosclerotic calcification. Ectatic infrarenal aorta measuring up to 2.4 cm. No mass or adenopathy. Reproductive:No acute finding Other: Peritoneal dialysis catheter with tip in the low pelvis near the abnormal bowel loops. No dialysate seen. Musculoskeletal: No acute finding. 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 IMPRESSION: 1. High-grade small bowel obstruction with transition point in the pelvis where there is clustering and distortion of small bowel loops. 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 2. No retained dialysate. Electronically Signed   By: Monte Fantasia M.D.   On: 05/03/2018 16:10    EKG:   Sinus tachycardia 110 bpm, nonspecific ST-T wave changes  IMPRESSION AND PLAN:   1.  Small bowel obstruction.  Case discussed with general surgery.  NG tube placement.  They advised not using the peritoneal dialysis catheter.  Send off fluid from peritoneal dialysis catheter and cover with antibiotics just in case peritonitis.   2.  Hyperkalemia in a dialysis patient.  Calcium, dextrose, insulin and bicarb ordered.  Hopefully can get a hemodialysis catheter inserted and do hemodialysis. 3.  End-stage renal disease.  Patient has a peritoneal catheter.  General surgery does not recommend using it and will have to have a temporary catheter for hemodialysis.  ER physician spoke with Dr. Delana Meyer vascular surgery. 4.  Essential hypertension.  Blood pressure on the lower side will hold Lotensin at this time. 5.  History of gout on allopurinol . All the records are reviewed  and case discussed with ED provider. Management plans discussed with the patient, and he is in agreement.  CODE STATUS: full code  TOTAL TIME TAKING CARE OF THIS PATIENT: 50 minutes.  Case discussed with ER physician, general surgery.  I was here while vascular surgery was on the phone.   Loletha Grayer M.D on 05/03/2018 at 5:35 PM  Between 7am to 6pm - Pager - [phone]  After 6pm call admission pager [phone]  [facility] Office  [phone]  CC: Primary care physician; [facility]

## 2018-05-03 NOTE — ED Triage Notes (Signed)
Patient presents to the ED with upper abdominal pain since Tuesday.  Patient reports nausea and vomiting and dizziness.  Patient states everything he eats comes back up.  Patient states he had diarrhea yesterday.

## 2018-05-03 NOTE — ED Notes (Signed)
Pt to get  abd xr for ng tube placement.

## 2018-05-03 NOTE — ED Notes (Signed)
Pt asking for ice chips - pt npo

## 2018-05-03 NOTE — Progress Notes (Addendum)
Pharmacy Antibiotic Note  Matthew Mathis is a 57 y.o. male admitted on 05/03/2018 with IAI.  Pharmacy has been consulted for cefepime and vancomycin dosing.  Plan: 1. Cefepime 2 gm IV Q48H 2. Vancomycin 2000 mg IV x 1. Will check vancomycin random in 4 days (PD dosing) and redose as indicated.  Height: 5\' 2"  (157.5 cm) Weight: 170 lb (77.1 kg) IBW/kg (Calculated) : 54.6  Temp (24hrs), Avg:97.8 F (36.6 C), Min:97.8 F (36.6 C), Max:97.8 F (36.6 C)  Recent Labs  Lab 05/03/18 1521 05/03/18 1547  WBC 11.9*  --   CREATININE 14.71*  --   LATICACIDVEN  --  1.8    Estimated Creatinine Clearance: 5 mL/min (A) (by C-G formula based on SCr of 14.71 mg/dL (H)).    No Known Allergies  Antimicrobials this admission:   Dose adjustments this admission: Vancomycin loaded x 1, check random in 3 to 4 days (PD dosing) Cefepime 2 gm Q48H - PD dosing  Microbiology results:  BCx:   UCx:    Sputum:    MRSA PCR:   Thank you for allowing pharmacy to be a part of this patient's care.  Laural Benes, Pharm.D., BCPS Clinical Pharmacist 05/03/2018 5:40 PM

## 2018-05-03 NOTE — Progress Notes (Signed)
Manual drain performed, fluid on its way to the lab now.    05/03/18 1849  Initial Treatment  Effluent to Lab Yes  Peritoneal Dialysis - Drain  Effluent Appearance Clear;Yellow  Effluent to Lab Yes  Fluid Balance - CAPD  Output this Exchange (mL) 1000 ml  Procedure Comments  Tolerated treatment well? Yes  Education / Care Plan  Dialysis Education Provided Yes

## 2018-05-03 NOTE — ED Notes (Signed)
Matthew Mathis attempted to take pt to floor - floor doesn't take pts until after 1930.

## 2018-05-03 NOTE — Consult Note (Signed)
SURGICAL CONSULTATION NOTE (initial) - cpt: 12878  HISTORY OF PRESENT ILLNESS (HPI):  57 y.o. male presented to [facility] ED today for evaluation of abdominal pain. Patient reports his abdominal pain started 2 days ago and "comes and goes", currently rating his pain as 5 out of 10, which he describes as "not too bad". Patient also says his pain has been improving at night during peritoneal dialysis, and he has been continuing to pass once daily BM's with his most recent one yesterday, though somewhat contradictory, he says he hasn't passed flatus x 4 days. Patient further reports he experienced similar 4 years ago, at which time he was admitted to [facility] and treated successfully with NG tube decompression. Since then, he 1 year ago underwent replacement of his PD catheter with Dr. Delana Meyer and has continued performing peritoneal dialysis despite his abdominal pain. Currently, he says his abdomen feels "a little" bigger than usual. He otherwise reports episodes of non-bloody emesis and denies fever/chills, CP, or SOB.  Surgery is consulted by ED physician Dr. Jacqualine Code in this context for evaluation and management of SBO.  PAST MEDICAL HISTORY (PMH):  Past Medical History:  Diagnosis Date  . Chronic renal failure    hx   . Gout    hx  . HTN (hypertension)      PAST SURGICAL HISTORY ([place]):  Past Surgical History:  Procedure Laterality Date  . CAPD INSERTION Right 05/24/2017   Procedure: LAPAROSCOPIC INSERTION CONTINUOUS AMBULATORY PERITONEAL DIALYSIS  (CAPD) CATHETER;  Surgeon: Katha Cabal, MD;  Location: [facility];  Service: Vascular;  Laterality: Right;  . DIALYSIS/PERMA CATHETER REMOVAL N/A 06/27/2017   Procedure: Dialysis/Perma Catheter Removal;  Surgeon: Katha Cabal, MD;  Location: [place] [facility];  Service: Cardiovascular;  Laterality: N/A;  . INSERTION OF DIALYSIS CATHETER Right 05/24/2017   Procedure: INSERTION OF DIALYSIS CATHETER ( TUNNELED CATH );  Surgeon: Katha Cabal,  MD;  Location: [facility] ORS;  Service: Vascular;  Laterality: Right;  . PERITONEAL CATHETER INSERTION    . REMOVAL OF A DIALYSIS CATHETER Left 05/24/2017   Procedure: REMOVAL OF A DIALYSIS CATHETER ( PD CATH );  Surgeon: Katha Cabal, MD;  Location: [facility];  Service: Vascular;  Laterality: Left;  . RENAL BIOPSY     non specific      MEDICATIONS:  Prior to Admission medications   Medication Sig Start Date End Date Taking? Authorizing Provider  allopurinol (ZYLOPRIM) 100 MG tablet Take 100 mg by mouth every evening.  07/25/16   [provider]  calcitRIOL (ROCALTROL) 0.5 MCG capsule Take 0.5 mcg by mouth daily after lunch.     [provider]  calcium acetate (PHOSLO) 667 MG capsule Take 667-1,334 mg by mouth 3 (three) times daily. Take 2 capsules (1,334mg ) three times a day and 1 capsule with snacks.    [provider]  cinacalcet (SENSIPAR) 90 MG tablet Take 90 mg by mouth at bedtime.    [provider]  enalapril (VASOTEC) 5 MG tablet Take 5 mg by mouth daily.    [provider]  fluocinonide (LIDEX) 0.05 % external solution Apply 1 application topically daily.    [provider]  folic acid-vitamin b complex-vitamin c-selenium-zinc (DIALYVITE) 3 MG TABS tablet Take 1 tablet by mouth at bedtime.     [provider]  furosemide (LASIX) 80 MG tablet Take 80 mg by mouth daily after lunch.     [provider]  HYDROcodone-acetaminophen (NORCO) 5-325 MG tablet Take 1-2  tablets by mouth every 6 (six) hours as needed for moderate pain or severe pain. 05/24/17   Schnier, Dolores Lory, MD  irbesartan (AVAPRO) 150 MG tablet Take 150 mg by mouth daily.    [provider]  metoprolol succinate (TOPROL-XL) 25 MG 24 hr tablet Take 25 mg by mouth at bedtime.    [provider]  Multiple Vitamins-Minerals (DIALYVITE 800/ULTRA D) TABS Take 1 tablet by mouth daily. 05/22/17   [provider]  mupirocin ointment  (BACTROBAN) 2 % APPLY A SMALL AMOUNT TO SKIN ONCE A DAY AS NEEDED APPLY TO EXIT SITE WITH DRESSING CHANGES 05/15/17   [provider]  predniSONE (DELTASONE) 20 MG tablet Take 20 mg by mouth as needed (with gout flare).    [provider]     ALLERGIES:  No Known Allergies   SOCIAL HISTORY:  Social History   Socioeconomic History  . Marital status: Married    Spouse name: Not on file  . Number of children: Not on file  . Years of education: Not on file  . Highest education level: Not on file  Occupational History  . Not on file  Social Needs  . Financial resource strain: Not on file  . Food insecurity:    Worry: Not on file    Inability: Not on file  . Transportation needs:    Medical: Not on file    Non-medical: Not on file  Tobacco Use  . Smoking status: Former Smoker    Packs/day: 0.50    Types: Cigarettes    Last attempt to quit: 12/04/2010    Years since quitting: 7.4  . Smokeless tobacco: Never Used  . Tobacco comment: used to smoke 2-3 ppd, now smokes 3 cigarettes daily   Substance and Sexual Activity  . Alcohol use: No  . Drug use: No  . Sexual activity: Not on file  Lifestyle  . Physical activity:    Days per week: Not on file    Minutes per session: Not on file  . Stress: Not on file  Relationships  . Social connections:    Talks on phone: Not on file    Gets together: Not on file    Attends religious service: Not on file    Active member of club or organization: Not on file    Attends meetings of clubs or organizations: Not on file    Relationship status: Not on file  . Intimate partner violence:    Fear of current or ex partner: Not on file    Emotionally abused: Not on file    Physically abused: Not on file    Forced sexual activity: Not on file  Other Topics Concern  . Not on file  Social History Narrative   Works for a [employer]     The patient currently resides (home / rehab facility / nursing home): Home The  patient normally is (ambulatory / bedbound): Ambulatory   FAMILY HISTORY:  Family History  Problem Relation Age of Onset  . Diabetes Father   . Hypertension Mother        brother, sister      REVIEW OF SYSTEMS:  Constitutional: denies weight loss, fever, chills, or sweats  Eyes: denies any other vision changes, history of eye injury  ENT: denies sore throat, hearing problems  Respiratory: denies shortness of breath, wheezing  Cardiovascular: denies chest pain, palpitations  Gastrointestinal: abdominal pain, N/V, and bowel function as per HPI Genitourinary: denies burning with urination  or urinary frequency Musculoskeletal: denies any other joint pains or cramps  Skin: denies any other rashes or skin discolorations  Neurological: denies any other headache, dizziness, weakness  Psychiatric: denies any other depression, anxiety   All other review of systems were negative   VITAL SIGNS:  Temp:  [97.8 F (36.6 C)] 97.8 F (36.6 C) (05/30 1517) Pulse Rate:  [97-112] 97 (05/30 1530) Resp:  [19-22] 22 (05/30 1640) BP: (78-105)/(55-78) 105/78 (05/30 1640) SpO2:  [96 %-99 %] 96 % (05/30 1530) Weight:  [170 lb (77.1 kg)] 170 lb (77.1 kg) (05/30 1520)     Height: 5\' 2"  (157.5 cm) Weight: 170 lb (77.1 kg) BMI (Calculated): 31.09   INTAKE/OUTPUT:  This shift: No intake/output data recorded.  Last 2 shifts: @IOLAST2SHIFTS @   PHYSICAL EXAM:  Constitutional:  -- Normal body habitus  -- Awake, alert, and oriented x3, no apparent distress Eyes:  -- Pupils equally round and reactive to light  -- No scleral icterus, B/L no occular discharge Ear, nose, throat: -- Neck is FROM WNL -- No jugular venous distension  Pulmonary:  -- No wheezes or rhales -- Equal breath sounds bilaterally -- Breathing non-labored at rest Cardiovascular:  -- S1, S2 present  -- No pericardial rubs  Gastrointestinal:  -- Abdomen soft and currently completely non-tender with mild appreciable distention  (though 1L fluid instilled by PD RN to obtain cell counts), no guarding or rebound tenderness, PD catheter appears to be well-secured with no surrounding erythema or purulent drainage -- No abdominal masses appreciated, pulsatile or otherwise  Musculoskeletal and Integumentary:  -- Wounds or skin discoloration: None appreciated except post-surgical PD catheter placement and removal wounds well-healed -- Extremities: B/L UE and LE FROM, hands and feet warm, no edema  Neurologic:  -- Motor function: Intact and symmetric -- Sensation: Intact and symmetric Psychiatric:  -- Mood and affect WNL  Labs:  CBC Latest Ref Rng & Units 05/03/2018 05/24/2017 05/18/2017  WBC 3.8 - 10.6 K/uL 11.9(H) - 9.5  Hemoglobin 13.0 - 18.0 g/dL 13.4 10.9(L) 11.3(L)  Hematocrit 40.0 - 52.0 % 38.3(L) 32.0(L) 33.5(L)  Platelets 150 - 440 K/uL 345 - 282   CMP Latest Ref Rng & Units 05/03/2018 05/24/2017 05/18/2017  Glucose 65 - 99 mg/dL 171(H) 107(H) 102(H)  BUN 6 - 20 mg/dL 100(H) - 61(H)  Creatinine 0.61 - 1.24 mg/dL 14.71(H) - 13.10(H)  Sodium 135 - 145 mmol/L 137 140 139  Potassium 3.5 - 5.1 mmol/L 5.7(H) 3.7 4.0  Chloride 101 - 111 mmol/L 89(L) - 97(L)  CO2 22 - 32 mmol/L 22 - 26  Calcium 8.9 - 10.3 mg/dL 8.7(L) - 8.9  Total Protein 6.5 - 8.1 g/dL 7.6 - -  Total Bilirubin 0.3 - 1.2 mg/dL 0.9 - -  Alkaline Phos 38 - 126 U/L 64 - -  AST 15 - 41 U/L 21 - -  ALT 17 - 63 U/L 20 - -   Imaging studies:  CT Abdomen and Pelvis without Contrast (05/03/2018) - personally reviewed and discussed with patient, ED physician, and admitting medical physician Stomach/Bowel: Dilated small bowel with fluid levels leading to a transition point in the pelvis where there is clustering and distortion of bowel loops. On coronal reformats the bowel appears somewhat encapsulated with smooth and thin peritoneal thickening. The terminal ileum and colon is decompressed. No evidence of pneumatosis. No pericecal  inflammation.  Vascular/Lymphatic: Diffuse atherosclerotic calcification. Ectatic infrarenal aorta measuring up to 2.4 cm. No mass or adenopathy.  Other: Peritoneal dialysis catheter  with tip in the low pelvis near the abnormal bowel loops. No dialysate seen.  Assessment/Plan: (ICD-10's: K36.699) 57 y.o. male with recurrent small bowel obstruction associated with indwelling peritoneal dialysis catheter, complicated by pertinent comorbidities including HTN, ESRD on chronic peritoneal dialysis (prior short-term hemodialysis via tunneled catheter), and gout.   - NPO for now, IV fluids  - nasogastric decompression  - pain control as needed (minimize narcotics)  - monitor abdominal exam and bowel function  - do not use peritoneal dialysis catheter until resolution of current acute issues, will need temporary hemodialysis catheter  - laparotomy with removal of peritoneal dialysis catheter if does not improve also discussed   - medical management of comorbidities as per medical team  - DVT prophylaxis, ambulation encouraged  All of the above findings and recommendations were discussed with the patient, ED physician, and admitting medical physician, and all of patient's questions were answered to his expressed satisfaction.  Thank you for the opportunity to participate in this patient's care.   -- Marilynne Drivers Rosana Hoes, MD, [place]: [facility] - Partnering for exceptional care. Office: [phone]

## 2018-05-03 NOTE — Op Note (Signed)
  OPERATIVE NOTE   PROCEDURE: 1. Insertion of temporary dialysis catheter catheter right femoral approach.  PRE-OPERATIVE DIAGNOSIS: Complication dialysis device; end-stage renal disease on PD  POST-OPERATIVE DIAGNOSIS: Same  SURGEON: Katha Cabal M.D.  ANESTHESIA: 1% lidocaine local infiltration  ESTIMATED BLOOD LOSS: Minimal cc  INDICATIONS:   Matthew Mathis is a 57 y.o. male who presents with hyperkalemia and malfunction of the PD catheter therefore temporary catheter is being placed.  DESCRIPTION: After obtaining full informed written consent, the patient was positioned supine. The right groin was prepped and draped in a sterile fashion. Ultrasound was placed in a sterile sleeve. Ultrasound was utilized to identify the right femoral vein which is noted to be echolucent and compressible indicating patency. Images recorded for the permanent record. Under real-time visualization a Seldinger needle is inserted into the vein and the guidewires advanced without difficulty. Small counterincision was made at the wire insertion site. Dilator is passed over the wire and the temporary dialysis catheter catheter is fed over the wire without difficulty.  All lumens aspirate and flush easily and are packed with heparin saline. Catheter secured to the skin of the right thigh with 2-0 silk. A sterile dressing is applied with Biopatch.  COMPLICATIONS: None  CONDITION: Unchanged  Matthew Mathis Office:  [phone] 05/03/2018, 7:53 PM

## 2018-05-03 NOTE — Progress Notes (Signed)
CODE SEPSIS - PHARMACY COMMUNICATION  **Broad Spectrum Antibiotics should be administered within 1 hour of Sepsis diagnosis**  Time Code Sepsis Called/Page Received: 15:43  Antibiotics Ordered: cefepime/vancomycin  Time of 1st antibiotic administration: 15:52  Additional action taken by pharmacy:   If necessary, Name of Provider/Nurse Contacted:     Laural Benes ,PharmD Clinical Pharmacist  05/03/2018  3:45 PM

## 2018-05-03 NOTE — ED Notes (Signed)
Report called to Seth Bake

## 2018-05-03 NOTE — Progress Notes (Signed)
MD at bedside, Pt received 1000 ml, per Dr.Lateef's verbal orders, we will let that dwell for 1hr before removing for cell count and culture.

## 2018-05-04 ENCOUNTER — Inpatient Hospital Stay: Payer: Medicare Other

## 2018-05-04 DIAGNOSIS — K56609 Unspecified intestinal obstruction, unspecified as to partial versus complete obstruction: Secondary | ICD-10-CM

## 2018-05-04 LAB — BASIC METABOLIC PANEL
Anion gap: 24 — ABNORMAL HIGH (ref 5–15)
BUN: 103 mg/dL — AB (ref 6–20)
CO2: 22 mmol/L (ref 22–32)
CREATININE: 15.5 mg/dL — AB (ref 0.61–1.24)
Calcium: 8.1 mg/dL — ABNORMAL LOW (ref 8.9–10.3)
Chloride: 93 mmol/L — ABNORMAL LOW (ref 101–111)
GFR calc Af Amer: 3 mL/min — ABNORMAL LOW (ref >60)
GFR calc non Af Amer: 3 mL/min — ABNORMAL LOW (ref >60)
Glucose, Bld: 115 mg/dL — ABNORMAL HIGH (ref 65–99)
Potassium: 5 mmol/L (ref 3.5–5.1)
SODIUM: 139 mmol/L (ref 135–145)

## 2018-05-04 LAB — CBC
HCT: 35.5 % — ABNORMAL LOW (ref 40.0–52.0)
Hemoglobin: 12 g/dL — ABNORMAL LOW (ref 13.0–18.0)
MCH: 33.5 pg (ref 26.0–34.0)
MCHC: 33.7 g/dL (ref 32.0–36.0)
MCV: 99.2 fL (ref 80.0–100.0)
PLATELETS: 269 10*3/uL (ref 150–440)
RBC: 3.57 MIL/uL — ABNORMAL LOW (ref 4.40–5.90)
RDW: 14.9 % — AB (ref 11.5–14.5)
WBC: 9.7 10*3/uL (ref 3.8–10.6)

## 2018-05-04 LAB — PATHOLOGIST SMEAR REVIEW

## 2018-05-04 MED ORDER — SODIUM CHLORIDE 0.9 % IV SOLN: 100.0000 mL | INTRAVENOUS | Status: DC | PRN

## 2018-05-04 MED ORDER — CHLORHEXIDINE GLUCONATE CLOTH 2 % EX PADS: 6.0000 | MEDICATED_PAD | Freq: Every day | CUTANEOUS | Status: DC

## 2018-05-04 MED ORDER — DIPHENHYDRAMINE HCL 50 MG/ML IJ SOLN
25.0000 mg | Freq: Once | INTRAMUSCULAR | Status: AC
  Administered 2018-05-04: 25 mg via INTRAVENOUS

## 2018-05-04 MED ORDER — LIDOCAINE-PRILOCAINE 2.5-2.5 % EX CREA
1.0000 "application " | TOPICAL_CREAM | CUTANEOUS | Status: DC | PRN
  Filled 2018-05-04: qty 5

## 2018-05-04 MED ORDER — ALTEPLASE 2 MG IJ SOLR: 2.0000 mg | Freq: Once | INTRAMUSCULAR | Status: DC | PRN

## 2018-05-04 MED ORDER — LIDOCAINE HCL (PF) 1 % IJ SOLN
5.0000 mL | INTRAMUSCULAR | Status: DC | PRN
  Filled 2018-05-04: qty 5

## 2018-05-04 MED ORDER — HEPARIN SODIUM (PORCINE) 1000 UNIT/ML DIALYSIS: 1000.0000 [IU] | INTRAMUSCULAR | Status: DC | PRN

## 2018-05-04 MED ORDER — PENTAFLUOROPROP-TETRAFLUOROETH EX AERO
1.0000 "application " | INHALATION_SPRAY | CUTANEOUS | Status: DC | PRN
  Filled 2018-05-04: qty 30

## 2018-05-04 NOTE — Progress Notes (Signed)
Patient reported NG tube dislodged with sneezing.  RN informed charge nurse and  paged hospitalist to inform and request directives.  Patient denies any pain or discomfort.  RN to report to on coming RN

## 2018-05-04 NOTE — Progress Notes (Signed)
Pre dialysis assessment 

## 2018-05-04 NOTE — Progress Notes (Signed)
This note also relates to the following rows which could not be included: Pulse Rate - Cannot attach notes to unvalidated device data Resp - Cannot attach notes to unvalidated device data BP - Cannot attach notes to unvalidated device data  Hd completed  

## 2018-05-04 NOTE — Progress Notes (Signed)
[place] [facility] [place] NAME: Matthew Mathis    MR#:  [id number]  DATE OF BIRTH:  1960-12-23  SUBJECTIVE:   Patient still with abdominal pain.  He does say that he passed flatulence today  REVIEW OF SYSTEMS:    Review of Systems  Constitutional: Negative for fever, chills weight loss HENT: Negative for ear pain, nosebleeds, congestion, facial swelling, rhinorrhea, neck pain, neck stiffness and ear discharge.   Respiratory: Negative for cough, shortness of breath, wheezing  Cardiovascular: Negative for chest pain, palpitations and leg swelling.  Gastrointestinal: Negative for heartburn, ++abdominal pain,NO  vomiting, diarrhea or consitpation Genitourinary: Negative for dysuria, urgency, frequency, hematuria Musculoskeletal: Negative for back pain or joint pain Neurological: Negative for dizziness, seizures, syncope, focal weakness,  numbness and headaches.  Hematological: Does not bruise/bleed easily.  Psychiatric/Behavioral: Negative for hallucinations, confusion, dysphoric mood    Tolerating Diet:NPO      DRUG ALLERGIES:  No Known Allergies  VITALS:  Blood pressure (!) 82/55, pulse (!) 102, temperature 97.6 F (36.4 C), temperature source Oral, resp. rate 17, height 5\' 2"  (1.575 m), weight 77.1 kg (170 lb), SpO2 98 %.  PHYSICAL EXAMINATION:  Constitutional: Appears well-developed and well-nourished. No distress. HENT: Normocephalic. .NGT placed Eyes: Conjunctivae and EOM are normal. PERRLA, no scleral icterus.  Neck: Normal ROM. Neck supple. No JVD. No tracheal deviation. CVS: RRR, S1/S2 +, no murmurs, no gallops, no carotid bruit.  Pulmonary: Effort and breath sounds normal, no stridor, rhonchi, wheezes, rales.  Abdominal: Soft. BS +,  no distension, tenderness, rebound or guarding.  Musculoskeletal: Normal range of motion. No edema and no tenderness.  Neuro: Alert. CN 2-12 grossly intact. No focal deficits. Skin: Skin is warm and  dry. No rash noted. Psychiatric: Normal mood and affect.   Patient has temporary right femoral cath placed   LABORATORY PANEL:   CBC Recent Labs  Lab 05/04/18 0500  WBC 9.7  HGB 12.0*  HCT 35.5*  PLT 269   ------------------------------------------------------------------------------------------------------------------  Chemistries  Recent Labs  Lab 05/03/18 1521 05/04/18 0500  NA 137 139  K 5.7* 5.0  CL 89* 93*  CO2 22 22  GLUCOSE 171* 115*  BUN 100* 103*  CREATININE 14.71* 15.50*  CALCIUM 8.7* 8.1*  AST 21  --   ALT 20  --   ALKPHOS 64  --   BILITOT 0.9  --    ------------------------------------------------------------------------------------------------------------------  Cardiac Enzymes No results for input(s): TROPONINI in the last 168 hours. ------------------------------------------------------------------------------------------------------------------  RADIOLOGY:  Ct Abdomen Pelvis Wo Contrast  Result Date: 05/03/2018 CLINICAL DATA:  Nausea and vomiting. EXAM: CT ABDOMEN AND PELVIS WITHOUT CONTRAST TECHNIQUE: Multidetector CT imaging of the abdomen and pelvis was performed following the standard protocol without IV contrast. COMPARISON:  01/02/2013 FINDINGS: Lower chest: No acute finding. Coronary calcification. Partially covered aortic valve calcification. Hepatobiliary: No focal liver abnormality.No evidence of biliary obstruction or stone. Pancreas: Unremarkable. Spleen: Unremarkable. Adrenals/Urinary Tract: Negative adrenals. Bilateral renal atrophy with cysts and parenchymal/arterial calcification. No hydronephrosis. Unremarkable bladder. Stomach/Bowel: Dilated small bowel with fluid levels leading to a transition point in the pelvis where there is clustering and distortion of bowel loops. On coronal reformats the bowel appears somewhat encapsulated with smooth and thin peritoneal thickening. The terminal ileum and colon is decompressed. No evidence of  pneumatosis. No pericecal inflammation. Vascular/Lymphatic: Diffuse atherosclerotic calcification. Ectatic infrarenal aorta measuring up to 2.4 cm. No mass or adenopathy. Reproductive:No acute finding Other: Peritoneal dialysis catheter with tip in the  low pelvis near the abnormal bowel loops. No dialysate seen. Musculoskeletal: No acute finding. 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 IMPRESSION: 1. High-grade small bowel obstruction with transition point in the pelvis where there is clustering and distortion of small bowel loops. 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 2. No retained dialysate. Electronically Signed   By: Monte Fantasia M.D.   On: 05/03/2018 16:10   Dg Abd 1 View  Result Date: 05/04/2018 CLINICAL DATA:  NG tube placement.  Small bowel obstruction. EXAM: ABDOMEN - 1 VIEW COMPARISON:  One-view chest x-ray 05/03/2018. FINDINGS: The NG tube remains in the stomach. The stomach is decompressed. Multiple dilated loops of small bowel are unchanged. A new right IJ line scratched at any right femoral venous line has been placed. Gas is present in the colon to the rectum. IMPRESSION: 1. NG tube terminates in the stomach which is decompressed. 2. Similar appearance of dilated loops of small bowel compatible with small-bowel obstruction. 3. There is some gas within colon. Electronically Signed   By: San Morelle M.D.   On: 05/04/2018 09:43   Dg Abd Portable 1 View  Result Date: 05/03/2018 CLINICAL DATA:  NG tube placement. EXAM: PORTABLE ABDOMEN - 1 VIEW COMPARISON:  Current abdomen pelvis CT. FINDINGS: Nasogastric tube passes well below the diaphragm into the stomach. Multiple dilated loops of small bowel are noted consistent with the high-grade small bowel obstruction better seen on the current CT. IMPRESSION: 1. Well-positioned nasogastric tube. Electronically Signed   By: Lajean Manes M.D.   On:  05/03/2018 18:35     ASSESSMENT AND PLAN:   57 year old male with end-stage renal disease on hemodialysis on peritoneal dialysis who presents to the emergency room due to abdominal pain.   1.  Small bowel obstruction: Due to peritoneal dialysis catheter Continue NG tube N.p.o. Await surgery evaluation So far peritoneal fluid culture is negative.  I think we can discontinue cefepime tomorrow if cultures remain negative.  2.  End-stage renal disease on hemodialysis: Patient to have dialysis via temporary femoral catheter placed  3.  Hyperkalemia: Improved with treatment  4.  History of gout: Continue allopurinol  Management plans discussed with the patient and he is in agreement.  CODE STATUS: Full  TOTAL TIME TAKING CARE OF THIS PATIENT: 27 minutes.     POSSIBLE D/C 2 to 4 days, DEPENDING ON CLINICAL CONDITION.   Taviana Westergren M.D on 05/04/2018 at 11:14 AM  Between 7am to 6pm - Pager - [phone] After 6pm go to [email or web address] - password [email or web address] [facility]  Office  [phone]  CC: Primary care physician; Care, [facility]  Note: This dictation was prepared with Dragon dictation along with smaller phrase technology. Any transcriptional errors that result from this process are unintentional.

## 2018-05-04 NOTE — Progress Notes (Signed)
SURGICAL PROGRESS NOTE    Matthew Mathis  HFW:[id number] DOB: 02/16/1961 DOA: 05/03/2018  History: The patient is a 57 year-old male with a history of ESRD on peritoneal dialysis who was admitted with a small bowel obstruction. CT scan shows the PD catheter terminating in the region of the abnormal bowel. Concern for encapsulating peritoneal sclerosis per radiology. The patient has been improving with NGT and bowel rest.  Subjective: The patient reports resolution of abdominal distension and abdominal pain. He states that he passed a small amount of gas twice today. He denies BM. He received hemodialysis today. NGT fell out overnight and was replace by nursing this morning. The patient denies nausea and vomiting. He denies fever and chills. He denies chest pain and shortness of breath.  Objective: Vitals:   05/04/18 1330 05/04/18 1345 05/04/18 1400 05/04/18 1416  BP: (!) 82/56  123/74 94/61  Pulse: 99 (!) 108 (!) 106 (!) 102  Resp: 16 16 16 16   Temp:   (!) 97.5 F (36.4 C)   TempSrc:      SpO2:      Weight:      Height:        Examination:  General exam: Appears calm and comfortable  Respiratory system: Respiratory effort normal. Cardiovascular system: RRR.  Gastrointestinal system: Abdomen is nondistended, soft and nontender. No guarding, rebound tenderness, or rigidity. Peritoneal catheter in place without gross signs of infection. Central nervous system: Alert and oriented. No focal neurological deficits. Extremities: No calf tenderness or swelling. Psychiatry: Judgement and insight appear normal. Mood & affect appropriate.     Data Reviewed: I have personally reviewed following labs and imaging studies  CBC: Recent Labs  Lab 05/03/18 1521 05/04/18 0500  WBC 11.9* 9.7  HGB 13.4 12.0*  HCT 38.3* 35.5*  MCV 97.8 99.2  PLT 345 027   Basic Metabolic Panel: Recent Labs  Lab 05/03/18 1521 05/04/18 0500  NA 137 139  K 5.7* 5.0  CL 89* 93*  CO2 22 22  GLUCOSE 171*  115*  BUN 100* 103*  CREATININE 14.71* 15.50*  CALCIUM 8.7* 8.1*   GFR: Estimated Creatinine Clearance: 4.7 mL/min (A) (by C-G formula based on SCr of 15.5 mg/dL (H)). Liver Function Tests: Recent Labs  Lab 05/03/18 1521  AST 21  ALT 20  ALKPHOS 64  BILITOT 0.9  PROT 7.6  ALBUMIN 4.5   Recent Labs  Lab 05/03/18 1521  LIPASE 21    Recent Results (from the past 240 hour(s))  Blood Culture (routine x 2)     Status: None (Preliminary result)   Collection Time: 05/03/18  3:48 PM  Result Value Ref Range Status   Specimen Description BLOOD LT [facility]  Final   Special Requests   Final    BOTTLES DRAWN AEROBIC AND ANAEROBIC Blood Culture adequate volume   Culture   Final    NO GROWTH < 24 HOURS Performed at [facility] [address], [place], [place] [address]    Report Status PENDING  Incomplete  Blood Culture (routine x 2)     Status: None (Preliminary result)   Collection Time: 05/03/18  3:57 PM  Result Value Ref Range Status   Specimen Description BLOOD RT [facility]  Final   Special Requests   Final    BOTTLES DRAWN AEROBIC AND ANAEROBIC Blood Culture adequate volume   Culture   Final    NO GROWTH < 24 HOURS Performed at [facility], [address] [place], [place] [address]  Report Status PENDING  Incomplete  Body fluid culture     Status: None (Preliminary result)   Collection Time: 05/03/18  4:23 PM  Result Value Ref Range Status   Specimen Description   Final    PERITONEAL CAVITY Performed at [facility], [address] [place], [place] [address]    Special Requests   Final    Normal Performed at [facility], [address] [place], [place] [address]    Gram Stain   Final    RARE WBC PRESENT, PREDOMINANTLY PMN NO ORGANISMS SEEN    Culture   Final    NO GROWTH 1 DAY Performed at [facility], [place] [address] [place],  [address]    Report Status PENDING  Incomplete     Radiology Studies: Ct Abdomen  Pelvis Wo Contrast  Result Date: 05/03/2018 CLINICAL DATA:  Nausea and vomiting. EXAM: CT ABDOMEN AND PELVIS WITHOUT CONTRAST TECHNIQUE: Multidetector CT imaging of the abdomen and pelvis was performed following the standard protocol without IV contrast. COMPARISON:  01/02/2013 FINDINGS: Lower chest: No acute finding. Coronary calcification. Partially covered aortic valve calcification. Hepatobiliary: No focal liver abnormality.No evidence of biliary obstruction or stone. Pancreas: Unremarkable. Spleen: Unremarkable. Adrenals/Urinary Tract: Negative adrenals. Bilateral renal atrophy with cysts and parenchymal/arterial calcification. No hydronephrosis. Unremarkable bladder. Stomach/Bowel: Dilated small bowel with fluid levels leading to a transition point in the pelvis where there is clustering and distortion of bowel loops. On coronal reformats the bowel appears somewhat encapsulated with smooth and thin peritoneal thickening. The terminal ileum and colon is decompressed. No evidence of pneumatosis. No pericecal inflammation. Vascular/Lymphatic: Diffuse atherosclerotic calcification. Ectatic infrarenal aorta measuring up to 2.4 cm. No mass or adenopathy. Reproductive:No acute finding Other: Peritoneal dialysis catheter with tip in the low pelvis near the abnormal bowel loops. No dialysate seen. Musculoskeletal: No acute finding. 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 IMPRESSION: 1. High-grade small bowel obstruction with transition point in the pelvis where there is clustering and distortion of small bowel loops. 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 2. No retained dialysate. Electronically Signed   By: Monte Fantasia M.D.   On: 05/03/2018 16:10   Dg Abd 1 View  Result Date: 05/04/2018 CLINICAL DATA:  NG tube placement.  Small bowel obstruction. EXAM: ABDOMEN - 1 VIEW COMPARISON:  One-view chest x-ray 05/03/2018.  FINDINGS: The NG tube remains in the stomach. The stomach is decompressed. Multiple dilated loops of small bowel are unchanged. A new right IJ line scratched at any right femoral venous line has been placed. Gas is present in the colon to the rectum. IMPRESSION: 1. NG tube terminates in the stomach which is decompressed. 2. Similar appearance of dilated loops of small bowel compatible with small-bowel obstruction. 3. There is some gas within colon. Electronically Signed   By: San Morelle M.D.   On: 05/04/2018 09:43   Dg Abd Portable 1 View  Result Date: 05/03/2018 CLINICAL DATA:  NG tube placement. EXAM: PORTABLE ABDOMEN - 1 VIEW COMPARISON:  Current abdomen pelvis CT. FINDINGS: Nasogastric tube passes well below the diaphragm into the stomach. Multiple dilated loops of small bowel are noted consistent with the high-grade small bowel obstruction better seen on the current CT. IMPRESSION: 1. Well-positioned nasogastric tube. Electronically Signed   By: Lajean Manes M.D.   On: 05/03/2018 18:35    Scheduled Meds: . allopurinol  100 mg Oral QPM  . Chlorhexidine Gluconate Cloth  6  each Topical [id number]  . Chlorhexidine Gluconate Cloth  6 each Topical Q0600  . heparin  5,000 Units Subcutaneous Q8H  . mupirocin ointment   Topical BID   Continuous Infusions: . [START ON 05/05/2018] ceFEPime (MAXIPIME) IV      ASSESSMENT/PLAN The patient is a 57 year-old male with a history of ESRD on PD admitted with small bowel obstruction possibly related to PD catheter. He appears to be improving clinically with NPO and NGT.  - Continue with NGT and NPO. Patient may have very small amounts of ice chips. - IV fluid hydration per medicine and nephrology teams. - Pain control as needed. Minimize narcotics. - Monitor abdominal exam and bowel function. - Do not use peritoneal dialysis catheter for now. Continue to use HD catheter until resolution of SBO. - DVT prophylaxis, ambulation encouraged.    Hadley Pen, DO

## 2018-05-04 NOTE — Progress Notes (Signed)
[facility]  ROUNDING NOTE   Subjective:  Patient well-known to [place]. He came in with small bowel obstruction. 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   Subsequently right femoral temporary dialysis catheter was placed.   Objective:  Vital signs in last 24 hours:  Temp:  [97.4 F (36.3 C)-97.8 F (36.6 C)] 97.6 F (36.4 C) (05/31 1030) Pulse Rate:  [36-112] 106 (05/31 1215) Resp:  [14-24] 15 (05/31 1230) BP: (77-124)/(54-78) 98/75 (05/31 1230) SpO2:  [96 %-100 %] 98 % (05/31 1030) Weight:  [77.1 kg (170 lb)] 77.1 kg (170 lb) (05/30 1520)  Weight change:  Filed Weights   05/03/18 1520  Weight: 77.1 kg (170 lb)    Intake/Output: I/O last 3 completed shifts: In: 100 [IV Piggyback:100] Out: 1325 [Urine:300; Emesis/NG output:25; Other:1000]   Intake/Output this shift:  No intake/output data recorded.  Physical Exam: General: No acute distress  Head: Normocephalic, atraumatic. Moist oral mucosal membranes  Eyes: Anicteric  Neck: Supple, trachea midline  Lungs:  Clear to auscultation, normal effort  Heart: S1S2 no rubs  Abdomen:  Mild distension, BS present, mild midepigastric tenderness  Extremities: Trace peripheral edema.  Neurologic: Awake, alert, following commands  Skin: No lesions  Access:  Right femoral temporary dialysis catheter, PD catheter    Basic Metabolic Panel: Recent Labs  Lab 05/03/18 1521 05/04/18 0500  NA 137 139  K 5.7* 5.0  CL 89* 93*  CO2 22 22  GLUCOSE 171* 115*  BUN 100* 103*  CREATININE 14.71* 15.50*  CALCIUM 8.7* 8.1*    Liver Function Tests: Recent Labs  Lab 05/03/18 1521  AST 21  ALT 20  ALKPHOS 64  BILITOT 0.9  PROT 7.6  ALBUMIN 4.5   Recent Labs  Lab 05/03/18 1521  LIPASE 21   No results for input(s): AMMONIA in the last 168 hours.  CBC: Recent Labs  Lab 05/03/18 1521 05/04/18 0500  WBC 11.9* 9.7  HGB 13.4 12.0*  HCT  38.3* 35.5*  MCV 97.8 99.2  PLT 345 269    Cardiac Enzymes: No results for input(s): CKTOTAL, CKMB, CKMBINDEX, TROPONINI in the last 168 hours.  BNP: Invalid input(s): POCBNP  CBG: No results for input(s): GLUCAP in the last 168 hours.  Microbiology: Results for orders placed or performed during the hospital encounter of 05/03/18  Blood Culture (routine x 2)     Status: None (Preliminary result)   Collection Time: 05/03/18  3:48 PM  Result Value Ref Range Status   Specimen Description BLOOD LT [facility]  Final   Special Requests   Final    BOTTLES DRAWN AEROBIC AND ANAEROBIC Blood Culture adequate volume   Culture   Final    NO GROWTH < 24 HOURS Performed at [facility], [address] [place], [place] [address]    Report Status PENDING  Incomplete  Blood Culture (routine x 2)     Status: None (Preliminary result)   Collection Time: 05/03/18  3:57 PM  Result Value Ref Range Status   Specimen Description BLOOD RT [facility]  Final   Special Requests   Final    BOTTLES DRAWN AEROBIC AND ANAEROBIC Blood Culture adequate volume   Culture   Final    NO GROWTH < 24 HOURS Performed at [facility], [address], [place], [place] [address]    Report Status PENDING  Incomplete  Body fluid culture     Status: None (Preliminary result)  Collection Time: 05/03/18  4:23 PM  Result Value Ref Range Status   Specimen Description   Final    PERITONEAL CAVITY Performed at [facility], [address] [place], [place] [address]    Special Requests   Final    Normal Performed at [facility], [address] [place], [place] [address]    Gram Stain   Final    RARE WBC PRESENT, PREDOMINANTLY PMN NO ORGANISMS SEEN    Culture   Final    NO GROWTH 1 DAY Performed at [facility], [place] [address], [place], [place] [address]    Report Status PENDING  Incomplete    Coagulation Studies: No results for input(s): LABPROT, INR in the last 72  hours.  Urinalysis: No results for input(s): COLORURINE, LABSPEC, PHURINE, GLUCOSEU, HGBUR, BILIRUBINUR, KETONESUR, PROTEINUR, UROBILINOGEN, NITRITE, LEUKOCYTESUR in the last 72 hours.  Invalid input(s): APPERANCEUR    Imaging: Ct Abdomen Pelvis Wo Contrast  Result Date: 05/03/2018 CLINICAL DATA:  Nausea and vomiting. EXAM: CT ABDOMEN AND PELVIS WITHOUT CONTRAST TECHNIQUE: Multidetector CT imaging of the abdomen and pelvis was performed following the standard protocol without IV contrast. COMPARISON:  01/02/2013 FINDINGS: Lower chest: No acute finding. Coronary calcification. Partially covered aortic valve calcification. Hepatobiliary: No focal liver abnormality.No evidence of biliary obstruction or stone. Pancreas: Unremarkable. Spleen: Unremarkable. Adrenals/Urinary Tract: Negative adrenals. Bilateral renal atrophy with cysts and parenchymal/arterial calcification. No hydronephrosis. Unremarkable bladder. Stomach/Bowel: Dilated small bowel with fluid levels leading to a transition point in the pelvis where there is clustering and distortion of bowel loops. On coronal reformats the bowel appears somewhat encapsulated with smooth and thin peritoneal thickening. The terminal ileum and colon is decompressed. No evidence of pneumatosis. No pericecal inflammation. Vascular/Lymphatic: Diffuse atherosclerotic calcification. Ectatic infrarenal aorta measuring up to 2.4 cm. No mass or adenopathy. Reproductive:No acute finding Other: Peritoneal dialysis catheter with tip in the low pelvis near the abnormal bowel loops. No dialysate seen. Musculoskeletal: No acute finding. 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 IMPRESSION: 1. High-grade small bowel obstruction with transition point in the pelvis where there is clustering and distortion of small bowel loops. 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 2. No retained dialysate.  Electronically Signed   By: Monte Fantasia M.D.   On: 05/03/2018 16:10   Dg Abd 1 View  Result Date: 05/04/2018 CLINICAL DATA:  NG tube placement.  Small bowel obstruction. EXAM: ABDOMEN - 1 VIEW COMPARISON:  One-view chest x-ray 05/03/2018. FINDINGS: The NG tube remains in the stomach. The stomach is decompressed. Multiple dilated loops of small bowel are unchanged. A new right IJ line scratched at any right femoral venous line has been placed. Gas is present in the colon to the rectum. IMPRESSION: 1. NG tube terminates in the stomach which is decompressed. 2. Similar appearance of dilated loops of small bowel compatible with small-bowel obstruction. 3. There is some gas within colon. Electronically Signed   By: San Morelle M.D.   On: 05/04/2018 09:43   Dg Abd Portable 1 View  Result Date: 05/03/2018 CLINICAL DATA:  NG tube placement. EXAM: PORTABLE ABDOMEN - 1 VIEW COMPARISON:  Current abdomen pelvis CT. FINDINGS: Nasogastric tube passes well below the diaphragm into the stomach. Multiple dilated loops of small bowel are noted consistent with the high-grade small bowel obstruction better seen on the current CT. IMPRESSION: 1. Well-positioned nasogastric tube. Electronically Signed   By: Lajean Manes M.D.   On:  05/03/2018 18:35     Medications:   . sodium chloride    . sodium chloride    . [START ON 05/05/2018] ceFEPime (MAXIPIME) IV    . vancomycin     . allopurinol  100 mg Oral QPM  . Chlorhexidine Gluconate Cloth  6 each Topical Q0600  . Chlorhexidine Gluconate Cloth  6 each Topical Q0600  . heparin  5,000 Units Subcutaneous Q8H  . mupirocin ointment   Topical BID   sodium chloride, sodium chloride, acetaminophen **OR** acetaminophen, alteplase, heparin, lidocaine (PF), lidocaine-prilocaine, morphine injection, ondansetron **OR** ondansetron (ZOFRAN) IV, pentafluoroprop-tetrafluoroeth  Assessment/ Plan:  57 y.o. male with past medical history of ESRD on PD, anemia of chronic  kidney disease, secondary hyperparathyroidism, gout, prior PD catheter complication who presents now with small bowel obstruction with possible encapsulating [place] sclerosis.  1.  ESRD on PD. 2.  Small bowel obstruction with the catheter adjacent to area of obstruction. 3.  Anemia chronic kidney disease 4.  Secondary hyperparathyroidism.  Plan: Patient came in with small bowel obstruction.  He has had prior history of PD catheter complication.  The peritoneal dialysis catheter appears to be adjacent to the area of small bowel obstruction.  On the CT scan the specter of encapsulating peritoneal sclerosis was mentioned.  General surgery has seen the patient.  He has been placed on bowel rest and NG tube placement.  Unclear as to whether this will resolve the obstruction.  He may require laparotomy with PD catheter removal.  Therefore temporary dialysis catheter was placed.  He is seen and evaluated during hemodialysis today and appears to be tolerating well thus far.  We will continue to monitor the patient's progress closely over the weekend.  Further plan as patient progresses.   LOS: 1 Bryn Perkin 5/31/201912:42 PM

## 2018-05-04 NOTE — Progress Notes (Signed)
This note also relates to the following rows which could not be included: Pulse Rate - Cannot attach notes to unvalidated device data Resp - Cannot attach notes to unvalidated device data BP - Cannot attach notes to unvalidated device data SpO2 - Cannot attach notes to unvalidated device data  Hd started  

## 2018-05-05 LAB — BASIC METABOLIC PANEL
Anion gap: 18 — ABNORMAL HIGH (ref 5–15)
BUN: 54 mg/dL — AB (ref 6–20)
CHLORIDE: 95 mmol/L — AB (ref 101–111)
CO2: 25 mmol/L (ref 22–32)
CREATININE: 9.41 mg/dL — AB (ref 0.61–1.24)
Calcium: 7.9 mg/dL — ABNORMAL LOW (ref 8.9–10.3)
GFR calc Af Amer: 6 mL/min — ABNORMAL LOW (ref >60)
GFR calc non Af Amer: 5 mL/min — ABNORMAL LOW (ref >60)
GLUCOSE: 68 mg/dL (ref 65–99)
Potassium: 4.1 mmol/L (ref 3.5–5.1)
SODIUM: 138 mmol/L (ref 135–145)

## 2018-05-05 LAB — HIV ANTIBODY (ROUTINE TESTING W REFLEX): HIV SCREEN 4TH GENERATION: NONREACTIVE

## 2018-05-05 NOTE — Progress Notes (Signed)
[facility]  ROUNDING NOTE   Subjective:  Patient here with small bowel obstruction. Still has NG tube in place. There is still some measurable NG output.   Objective:  Vital signs in last 24 hours:  Temp:  [97.5 F (36.4 C)-98.2 F (36.8 C)] 98.2 F (36.8 C) (06/01 0511) Pulse Rate:  [96-114] 96 (06/01 0511) Resp:  [16-20] 20 (06/01 0511) BP: (82-123)/(56-74) 109/69 (06/01 0511) SpO2:  [100 %] 100 % (06/01 0511)  Weight change:  Filed Weights   05/03/18 1520  Weight: 77.1 kg (170 lb)    Intake/Output: I/O last 3 completed shifts: In: 120 [P.O.:120] Out: 325 [Urine:300; Emesis/NG output:225]   Intake/Output this shift:  No intake/output data recorded.  Physical Exam: General: No acute distress  Head: Normocephalic, atraumatic. Moist oral mucosal membranes, NG tube in place  Eyes: Anicteric  Neck: Supple, trachea midline  Lungs:  Clear to auscultation, normal effort  Heart: S1S2 no rubs  Abdomen:  Mild distension, BS present, mild midepigastric tenderness  Extremities: Trace peripheral edema.  Neurologic: Awake, alert, following commands  Skin: No lesions  Access:  Right femoral temporary dialysis catheter, PD catheter    Basic Metabolic Panel: Recent Labs  Lab 05/03/18 1521 05/04/18 0500 05/05/18 0454  NA 137 139 138  K 5.7* 5.0 4.1  CL 89* 93* 95*  CO2 22 22 25   GLUCOSE 171* 115* 68  BUN 100* 103* 54*  CREATININE 14.71* 15.50* 9.41*  CALCIUM 8.7* 8.1* 7.9*    Liver Function Tests: Recent Labs  Lab 05/03/18 1521  AST 21  ALT 20  ALKPHOS 64  BILITOT 0.9  PROT 7.6  ALBUMIN 4.5   Recent Labs  Lab 05/03/18 1521  LIPASE 21   No results for input(s): AMMONIA in the last 168 hours.  CBC: Recent Labs  Lab 05/03/18 1521 05/04/18 0500  WBC 11.9* 9.7  HGB 13.4 12.0*  HCT 38.3* 35.5*  MCV 97.8 99.2  PLT 345 269    Cardiac Enzymes: No results for input(s): CKTOTAL, CKMB, CKMBINDEX, TROPONINI in the last 168  hours.  BNP: Invalid input(s): POCBNP  CBG: No results for input(s): GLUCAP in the last 168 hours.  Microbiology: Results for orders placed or performed during the hospital encounter of 05/03/18  Blood Culture (routine x 2)     Status: None (Preliminary result)   Collection Time: 05/03/18  3:48 PM  Result Value Ref Range Status   Specimen Description BLOOD LT [facility]  Final   Special Requests   Final    BOTTLES DRAWN AEROBIC AND ANAEROBIC Blood Culture adequate volume   Culture   Final    NO GROWTH 2 DAYS Performed at [facility], [address] [place], [place] [address]    Report Status PENDING  Incomplete  Blood Culture (routine x 2)     Status: None (Preliminary result)   Collection Time: 05/03/18  3:57 PM  Result Value Ref Range Status   Specimen Description BLOOD RT [facility]  Final   Special Requests   Final    BOTTLES DRAWN AEROBIC AND ANAEROBIC Blood Culture adequate volume   Culture   Final    NO GROWTH 2 DAYS Performed at [facility], [address], [place], [place] [address]    Report Status PENDING  Incomplete  Body fluid culture     Status: None (Preliminary result)   Collection Time: 05/03/18  4:23 PM  Result Value Ref Range Status   Specimen Description   Final  PERITONEAL CAVITY Performed at [facility], [address] [place], [place] [address]    Special Requests   Final    Normal Performed at [facility], [address] [place], [place] [address]    Gram Stain   Final    RARE WBC PRESENT, PREDOMINANTLY PMN NO ORGANISMS SEEN    Culture   Final    NO GROWTH 2 DAYS Performed at [facility], [place] [address], [place],  [address]    Report Status PENDING  Incomplete    Coagulation Studies: No results for input(s): LABPROT, INR in the last 72 hours.  Urinalysis: No results for input(s): COLORURINE, LABSPEC, PHURINE, GLUCOSEU, HGBUR, BILIRUBINUR, KETONESUR, PROTEINUR, UROBILINOGEN, NITRITE,  LEUKOCYTESUR in the last 72 hours.  Invalid input(s): APPERANCEUR    Imaging: Ct Abdomen Pelvis Wo Contrast  Result Date: 05/03/2018 CLINICAL DATA:  Nausea and vomiting. EXAM: CT ABDOMEN AND PELVIS WITHOUT CONTRAST TECHNIQUE: Multidetector CT imaging of the abdomen and pelvis was performed following the standard protocol without IV contrast. COMPARISON:  01/02/2013 FINDINGS: Lower chest: No acute finding. Coronary calcification. Partially covered aortic valve calcification. Hepatobiliary: No focal liver abnormality.No evidence of biliary obstruction or stone. Pancreas: Unremarkable. Spleen: Unremarkable. Adrenals/Urinary Tract: Negative adrenals. Bilateral renal atrophy with cysts and parenchymal/arterial calcification. No hydronephrosis. Unremarkable bladder. Stomach/Bowel: Dilated small bowel with fluid levels leading to a transition point in the pelvis where there is clustering and distortion of bowel loops. On coronal reformats the bowel appears somewhat encapsulated with smooth and thin peritoneal thickening. The terminal ileum and colon is decompressed. No evidence of pneumatosis. No pericecal inflammation. Vascular/Lymphatic: Diffuse atherosclerotic calcification. Ectatic infrarenal aorta measuring up to 2.4 cm. No mass or adenopathy. Reproductive:No acute finding Other: Peritoneal dialysis catheter with tip in the low pelvis near the abnormal bowel loops. No dialysate seen. Musculoskeletal: No acute finding. 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 IMPRESSION: 1. High-grade small bowel obstruction with transition point in the pelvis where there is clustering and distortion of small bowel loops. 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 2. No retained dialysate. Electronically Signed   By: Monte Fantasia M.D.   On: 05/03/2018 16:10   Dg Abd 1 View  Result Date: 05/04/2018 CLINICAL DATA:  NG tube placement.   Small bowel obstruction. EXAM: ABDOMEN - 1 VIEW COMPARISON:  One-view chest x-ray 05/03/2018. FINDINGS: The NG tube remains in the stomach. The stomach is decompressed. Multiple dilated loops of small bowel are unchanged. A new right IJ line scratched at any right femoral venous line has been placed. Gas is present in the colon to the rectum. IMPRESSION: 1. NG tube terminates in the stomach which is decompressed. 2. Similar appearance of dilated loops of small bowel compatible with small-bowel obstruction. 3. There is some gas within colon. Electronically Signed   By: San Morelle M.D.   On: 05/04/2018 09:43   Dg Abd Portable 1 View  Result Date: 05/03/2018 CLINICAL DATA:  NG tube placement. EXAM: PORTABLE ABDOMEN - 1 VIEW COMPARISON:  Current abdomen pelvis CT. FINDINGS: Nasogastric tube passes well below the diaphragm into the stomach. Multiple dilated loops of small bowel are noted consistent with the high-grade small bowel obstruction better seen on the current CT. IMPRESSION: 1. Well-positioned nasogastric tube. Electronically Signed   By: Lajean Manes M.D.   On: 05/03/2018 18:35     Medications:    . allopurinol  100 mg Oral QPM  . Chlorhexidine Gluconate Cloth  6 each Topical Q0600  . Chlorhexidine Gluconate Cloth  6 each Topical Q0600  . heparin  5,000 Units Subcutaneous Q8H  . mupirocin ointment   Topical BID   acetaminophen **OR** acetaminophen, morphine injection, ondansetron **OR** ondansetron (ZOFRAN) IV  Assessment/ Plan:  57 y.o. male with past medical history of ESRD on PD, anemia of chronic kidney disease, secondary hyperparathyroidism, gout, prior PD catheter complication who presents now with small bowel obstruction with possible encapsulating Tennille sclerosis.  1.  ESRD on PD. 2.  Small bowel obstruction with the catheter adjacent to area of obstruction. 3.  Anemia chronic kidney disease 4.  Secondary hyperparathyroidism.  Plan: Patient still has small bowel  obstruction.  Unclear as to whether this obstruction will resolve without taking out peritoneal dialysis catheter as the patient has had a similar episode in the past.  We will await further surgical input.  He will likely need to be on hemodialysis for.  Of time.  Otherwise continue NG drainage and n.p.o. status for now.   LOS: 2 Fumiko Cham 6/1/20191:22 PM

## 2018-05-05 NOTE — Progress Notes (Signed)
Pharmacy Antibiotic Note  Matthew Mathis is a 57 y.o. male admitted on 05/03/2018 with IAI.  Pharmacy has been consulted for cefepime and vancomycin dosing.  Plan: 1. Cefepime 2 gm IV Q48H 2. Vancomycin 2000 mg IV x 1. Will check vancomycin random in 4 days (PD dosing) and redose as indicated.  6/1: Patient had temporary Hemodialysis catheter placed and received HD on 5/31. F/u for next HD and HD schedule an dneed to order Vancomycin 750mg  IV with each HD and trough prior to 3rd HD session.  Cefepime discontinued    Height: 5\' 2"  (157.5 cm) Weight: 170 lb (77.1 kg) IBW/kg (Calculated) : 54.6  Temp (24hrs), Avg:97.7 F (36.5 C), Min:97.5 F (36.4 C), Max:98.2 F (36.8 C)  Recent Labs  Lab 05/03/18 1521 05/03/18 1547 05/03/18 2017 05/04/18 0500 05/05/18 0454  WBC 11.9*  --   --  9.7  --   CREATININE 14.71*  --   --  15.50* 9.41*  LATICACIDVEN  --  1.8 1.5  --   --     Estimated Creatinine Clearance: 7.8 mL/min (A) (by C-G formula based on SCr of 9.41 mg/dL (H)).    No Known Allergies  Antimicrobials this admission:   Dose adjustments this admission: Vancomycin loaded x 1, check random in 3 to 4 days (PD dosing) Cefepime 2 gm Q48H - PD dosing  6/1: pt now getting hemodialysis- need to see when next HD scheduled and order Vanc  Microbiology results:  BCx:   UCx:    Sputum:    MRSA PCR:   Thank you for allowing pharmacy to be a part of this patient's care.  Jama Mcmiller A, Pharm.D., BCPS Clinical Pharmacist 05/05/2018 12:59 PM

## 2018-05-05 NOTE — Progress Notes (Signed)
[facility] [place] NAME: Matthew Mathis    MR#:  [id number]  DATE OF BIRTH:  Apr 10, 1961  SUBJECTIVE:   Patient denies abdominal pain.  He does say that he passed flatulence  NG+  REVIEW OF SYSTEMS:    Review of Systems  Constitutional: Negative for fever, chills weight loss HENT: Negative for ear pain, nosebleeds, congestion, facial swelling, rhinorrhea, neck pain, neck stiffness and ear discharge.   Respiratory: Negative for cough, shortness of breath, wheezing  Cardiovascular: Negative for chest pain, palpitations and leg swelling.  Gastrointestinal: Negative for heartburn, ++abdominal pain,NO  vomiting, diarrhea or consitpation Genitourinary: Negative for dysuria, urgency, frequency, hematuria Musculoskeletal: Negative for back pain or joint pain Neurological: Negative for dizziness, seizures, syncope, focal weakness,  numbness and headaches.  Hematological: Does not bruise/bleed easily.  Psychiatric/Behavioral: Negative for hallucinations, confusion, dysphoric mood  Tolerating Diet:NPO  DRUG ALLERGIES:  No Known Allergies  VITALS:  Blood pressure 109/69, pulse 96, temperature 98.2 F (36.8 C), temperature source Oral, resp. rate 20, height 5\' 2"  (1.575 m), weight 77.1 kg (170 lb), SpO2 100 %.  PHYSICAL EXAMINATION:  Constitutional: Appears well-developed and well-nourished. No distress. HENT: Normocephalic. .NGT placed Eyes: Conjunctivae and EOM are normal. PERRLA, no scleral icterus.  Neck: Normal ROM. Neck supple. No JVD. No tracheal deviation. CVS: RRR, S1/S2 +, no murmurs, no gallops, no carotid bruit.  Pulmonary: Effort and breath sounds normal, no stridor, rhonchi, wheezes, rales.  Abdominal: Soft. BS +,  no distension, tenderness, rebound or guarding.  Musculoskeletal: Normal range of motion. No edema and no tenderness.  Neuro: Alert. CN 2-12 grossly intact. No focal deficits. Skin: Skin is warm and dry. No rash  noted. Psychiatric: Normal mood and affect.   Patient has temporary right femoral cath placed  LABORATORY PANEL:   CBC Recent Labs  Lab 05/04/18 0500  WBC 9.7  HGB 12.0*  HCT 35.5*  PLT 269   ------------------------------------------------------------------------------------------------------------------  Chemistries  Recent Labs  Lab 05/03/18 1521  05/05/18 0454  NA 137   < > 138  K 5.7*   < > 4.1  CL 89*   < > 95*  CO2 22   < > 25  GLUCOSE 171*   < > 68  BUN 100*   < > 54*  CREATININE 14.71*   < > 9.41*  CALCIUM 8.7*   < > 7.9*  AST 21  --   --   ALT 20  --   --   ALKPHOS 64  --   --   BILITOT 0.9  --   --    < > = values in this interval not displayed.   ------------------------------------------------------------------------------------------------------------------  Cardiac Enzymes No results for input(s): TROPONINI in the last 168 hours. ------------------------------------------------------------------------------------------------------------------  RADIOLOGY:  Ct Abdomen Pelvis Wo Contrast  Result Date: 05/03/2018 CLINICAL DATA:  Nausea and vomiting. EXAM: CT ABDOMEN AND PELVIS WITHOUT CONTRAST TECHNIQUE: Multidetector CT imaging of the abdomen and pelvis was performed following the standard protocol without IV contrast. COMPARISON:  01/02/2013 FINDINGS: Lower chest: No acute finding. Coronary calcification. Partially covered aortic valve calcification. Hepatobiliary: No focal liver abnormality.No evidence of biliary obstruction or stone. Pancreas: Unremarkable. Spleen: Unremarkable. Adrenals/Urinary Tract: Negative adrenals. Bilateral renal atrophy with cysts and parenchymal/arterial calcification. No hydronephrosis. Unremarkable bladder. Stomach/Bowel: Dilated small bowel with fluid levels leading to a transition point in the pelvis where there is clustering and distortion of bowel loops. On coronal reformats the bowel appears somewhat encapsulated with  smooth and thin peritoneal thickening. The terminal ileum and colon is decompressed. No evidence of pneumatosis. No pericecal inflammation. Vascular/Lymphatic: Diffuse atherosclerotic calcification. Ectatic infrarenal aorta measuring up to 2.4 cm. No mass or adenopathy. Reproductive:No acute finding Other: Peritoneal dialysis catheter with tip in the low pelvis near the abnormal bowel loops. No dialysate seen. Musculoskeletal: No acute finding. 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 IMPRESSION: 1. High-grade small bowel obstruction with transition point in the pelvis where there is clustering and distortion of small bowel loops. 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 2. No retained dialysate. Electronically Signed   By: Monte Fantasia M.D.   On: 05/03/2018 16:10   Dg Abd 1 View  Result Date: 05/04/2018 CLINICAL DATA:  NG tube placement.  Small bowel obstruction. EXAM: ABDOMEN - 1 VIEW COMPARISON:  One-view chest x-ray 05/03/2018. FINDINGS: The NG tube remains in the stomach. The stomach is decompressed. Multiple dilated loops of small bowel are unchanged. A new right IJ line scratched at any right femoral venous line has been placed. Gas is present in the colon to the rectum. IMPRESSION: 1. NG tube terminates in the stomach which is decompressed. 2. Similar appearance of dilated loops of small bowel compatible with small-bowel obstruction. 3. There is some gas within colon. Electronically Signed   By: San Morelle M.D.   On: 05/04/2018 09:43   Dg Abd Portable 1 View  Result Date: 05/03/2018 CLINICAL DATA:  NG tube placement. EXAM: PORTABLE ABDOMEN - 1 VIEW COMPARISON:  Current abdomen pelvis CT. FINDINGS: Nasogastric tube passes well below the diaphragm into the stomach. Multiple dilated loops of small bowel are noted consistent with the high-grade small bowel obstruction better seen on the current CT.  IMPRESSION: 1. Well-positioned nasogastric tube. Electronically Signed   By: Lajean Manes M.D.   On: 05/03/2018 18:35     ASSESSMENT AND PLAN:   57 year old male with end-stage renal disease on hemodialysis on peritoneal dialysis who presents to the emergency room due to abdominal pain.  1.Small bowel obstruction: Due to peritoneal dialysis catheter Continue NG tube--sill getting output 200 cc so far N.p.o. surgical evaluation noted. Continuing conservative management so far So far peritoneal fluid culture is negative.  I think we can discontinue cefepime since cultures remain negative.  2.  End-stage renal disease on hemodialysis: Patient to have dialysis via temporary femoral catheter placed  3.  Hyperkalemia: Improved with treatment  4.  History of gout: Continue allopurinol  Management plans discussed with the patient and he is in agreement.  CODE STATUS: Full  TOTAL TIME TAKING CARE OF THIS PATIENT: 27 minutes.     Fritzi Mandes M.D on 05/05/2018 at 10:08 AM  Between 7am to 6pm - Pager - [phone] After 6pm go to [email or web address] - password [email or web address] [facility]  Office  [phone]  CC: Primary care physician; Care, [facility]  Note: This dictation was prepared with Dragon dictation along with smaller phrase technology. Any transcriptional errors that result from this process are unintentional.

## 2018-05-05 NOTE — Progress Notes (Signed)
SURGICAL PROGRESS NOTE (cpt [id number])  Hospital Day(s): 2.   Post op day(s):  [name]   Interval History: Patient seen and examined, no acute events or new complaints overnight. Patient reports +flatus since yesterday afternoon, denies any abdominal pain, abdominal distention, N/V, fever/chills, CP, or SOB. Only 200 mL has drained from NG tube this shift with only clear non-bloody non-bilious fluid in NG tubing.  Review of Systems:  Constitutional: denies fever, chills  HEENT: denies cough or congestion  Respiratory: denies any shortness of breath  Cardiovascular: denies chest pain or palpitations  Gastrointestinal: abdominal pain, N/V, and bowel function as per interval history Genitourinary: denies burning with urination or urinary frequency Musculoskeletal: denies pain, decreased motor or sensation Integumentary: denies any other rashes or skin discolorations Neurological: denies HA or vision/hearing changes   Vital signs in last 24 hours: [min-max] current  Temp:  [97.5 F (36.4 C)-98.2 F (36.8 C)] 98.2 F (36.8 C) (06/01 0511) Pulse Rate:  [36-115] 96 (06/01 0511) Resp:  [14-24] 20 (06/01 0511) BP: (77-123)/(52-75) 109/69 (06/01 0511) SpO2:  [98 %-100 %] 100 % (06/01 0511)     Height: 5\' 2"  (157.5 cm) Weight: 170 lb (77.1 kg) BMI (Calculated): 31.09   Intake/Output this shift:  No intake/output data recorded.   Intake/Output last 2 shifts:  @IOLAST2SHIFTS @   Physical Exam:  Constitutional: alert, cooperative and no distress  HENT: normocephalic without obvious abnormality  Eyes: PERRL, EOM's grossly intact and symmetric  Neuro: CN II - XII grossly intact and symmetric without deficit  Respiratory: breathing non-labored at rest  Cardiovascular: regular rate and sinus rhythm  Gastrointestinal: soft, completely non-tender, and non-distended Musculoskeletal: UE and LE FROM, no edema or wounds, motor and sensation grossly intact, NT   Labs:  CBC Latest Ref Rng & Units  05/04/2018 05/03/2018 05/24/2017  WBC 3.8 - 10.6 K/uL 9.7 11.9(H) -  Hemoglobin 13.0 - 18.0 g/dL 12.0(L) 13.4 10.9(L)  Hematocrit 40.0 - 52.0 % 35.5(L) 38.3(L) 32.0(L)  Platelets 150 - 440 K/uL 269 345 -   CMP Latest Ref Rng & Units 05/05/2018 05/04/2018 05/03/2018  Glucose 65 - 99 mg/dL 68 115(H) 171(H)  BUN 6 - 20 mg/dL 54(H) 103(H) 100(H)  Creatinine 0.61 - 1.24 mg/dL 9.41(H) 15.50(H) 14.71(H)  Sodium 135 - 145 mmol/L 138 139 137  Potassium 3.5 - 5.1 mmol/L 4.1 5.0 5.7(H)  Chloride 101 - 111 mmol/L 95(L) 93(L) 89(L)  CO2 22 - 32 mmol/L 25 22 22   Calcium 8.9 - 10.3 mg/dL 7.9(L) 8.1(L) 8.7(L)  Total Protein 6.5 - 8.1 g/dL - - 7.6  Total Bilirubin 0.3 - 1.2 mg/dL - - 0.9  Alkaline Phos 38 - 126 U/L - - 64  AST 15 - 41 U/L - - 21  ALT 17 - 63 U/L - - 20   Imaging studies: No new pertinent imaging studies   Assessment/Plan: (ICD-10's: K21.699) 57 y.o. male with recurrent small bowel obstruction associated with indwelling peritoneal dialysis catheter, complicated by pertinent comorbidities including HTN, ESRD on chronic peritoneal dialysis (prior short-term hemodialysis via tunneled catheter), and gout.   - NG tube removed  - okay to trial clear liquids diet             - monitor abdominal exam and bowel function             - do not use peritoneal dialysis catheter until resolution of current acute issues             - laparotomy with removal of  peritoneal dialysis catheter if does not improve also discussed              - medical management of comorbidities as per medical team             - DVT prophylaxis, ambulation encouraged  All of the above findings and recommendations were discussed with the patient and his family/friend at bedside, and all of patient's questions were answered to his expressed satisfaction.  Thank you for the opportunity to participate in this patient's care.  -- Marilynne Drivers Rosana Hoes, MD, [place]: [facility]  for exceptional care. Office: [phone]

## 2018-05-06 NOTE — Progress Notes (Signed)
[facility]  ROUNDING NOTE   Subjective:  Patient has been started on a diet to surgery. He is being advanced to full liquid diet today.    Objective:  Vital signs in last 24 hours:  Temp:  [98 F (36.7 C)-98.5 F (36.9 C)] 98.1 F (36.7 C) (06/02 0448) Pulse Rate:  [82-105] 96 (06/02 0448) Resp:  [16-19] 16 (06/02 0448) BP: (110-121)/(70-86) 121/85 (06/02 0448) SpO2:  [99 %] 99 % (06/02 0448)  Weight change:  Filed Weights   05/03/18 1520  Weight: 77.1 kg (170 lb)    Intake/Output: I/O last 3 completed shifts: In: 0  Out: 300 [Urine:100; Emesis/NG output:200]   Intake/Output this shift:  No intake/output data recorded.  Physical Exam: General: No acute distress  Head: Normocephalic, atraumatic. Moist oral mucosal membranes  Eyes: Anicteric  Neck: Supple, trachea midline  Lungs:  Clear to auscultation, normal effort  Heart: S1S2 no rubs  Abdomen:  Soft, NTND, BS present  Extremities: Trace peripheral edema.  Neurologic: Awake, alert, following commands  Skin: No lesions  Access: Right femoral temporary dialysis catheter, PD catheter    Basic Metabolic Panel: Recent Labs  Lab 05/03/18 1521 05/04/18 0500 05/05/18 0454  NA 137 139 138  K 5.7* 5.0 4.1  CL 89* 93* 95*  CO2 22 22 25   GLUCOSE 171* 115* 68  BUN 100* 103* 54*  CREATININE 14.71* 15.50* 9.41*  CALCIUM 8.7* 8.1* 7.9*    Liver Function Tests: Recent Labs  Lab 05/03/18 1521  AST 21  ALT 20  ALKPHOS 64  BILITOT 0.9  PROT 7.6  ALBUMIN 4.5   Recent Labs  Lab 05/03/18 1521  LIPASE 21   No results for input(s): AMMONIA in the last 168 hours.  CBC: Recent Labs  Lab 05/03/18 1521 05/04/18 0500  WBC 11.9* 9.7  HGB 13.4 12.0*  HCT 38.3* 35.5*  MCV 97.8 99.2  PLT 345 269    Cardiac Enzymes: No results for input(s): CKTOTAL, CKMB, CKMBINDEX, TROPONINI in the last 168 hours.  BNP: Invalid input(s): POCBNP  CBG: No results for input(s): GLUCAP in the last 168  hours.  Microbiology: Results for orders placed or performed during the hospital encounter of 05/03/18  Blood Culture (routine x 2)     Status: None (Preliminary result)   Collection Time: 05/03/18  3:48 PM  Result Value Ref Range Status   Specimen Description BLOOD LT [facility]  Final   Special Requests   Final    BOTTLES DRAWN AEROBIC AND ANAEROBIC Blood Culture adequate volume   Culture   Final    NO GROWTH 3 DAYS Performed at [facility], [address], [place], [place] [address]    Report Status PENDING  Incomplete  Blood Culture (routine x 2)     Status: None (Preliminary result)   Collection Time: 05/03/18  3:57 PM  Result Value Ref Range Status   Specimen Description BLOOD RT [facility]  Final   Special Requests   Final    BOTTLES DRAWN AEROBIC AND ANAEROBIC Blood Culture adequate volume   Culture   Final    NO GROWTH 3 DAYS Performed at [facility], [address], [place], [place] [address]    Report Status PENDING  Incomplete  Body fluid culture     Status: None (Preliminary result)   Collection Time: 05/03/18  4:23 PM  Result Value Ref Range Status   Specimen Description   Final    PERITONEAL CAVITY Performed at [facility], [address], [place], [place] [address]    Special Requests   Final    Normal Performed at [facility], [place], [place] [address]    Gram Stain   Final    RARE WBC PRESENT, PREDOMINANTLY PMN NO ORGANISMS SEEN    Culture   Final    NO GROWTH 3 DAYS Performed at [facility], [place] [address], [place], [place] [address]    Report Status PENDING  Incomplete    Coagulation Studies: No results for input(s): LABPROT, INR in the last 72 hours.  Urinalysis: No results for input(s): COLORURINE, LABSPEC, PHURINE, GLUCOSEU, HGBUR, BILIRUBINUR, KETONESUR, PROTEINUR, UROBILINOGEN, NITRITE, LEUKOCYTESUR in the last 72 hours.  Invalid input(s): APPERANCEUR    Imaging: No results  found.   Medications:    . allopurinol  100 mg Oral QPM  . heparin  5,000 Units Subcutaneous Q8H  . mupirocin ointment   Topical BID   acetaminophen **OR** acetaminophen  Assessment/ Plan:  57 y.o. male with past medical history of ESRD on PD, anemia of chronic kidney disease, secondary hyperparathyroidism, gout, prior PD catheter complication who presents now with small bowel obstruction with possible encapsulating [place] sclerosis.  1.  ESRD on PD. 2.  Small bowel obstruction with PD catheter adjacent to area of obstruction. 3.  Anemia chronic kidney disease 4.  Secondary hyperparathyroidism.  Plan: It appears that the patient's intestinal obstruction has improved a bit.  Case discussed in depth with surgery today.  The current plan is to slowly advance diet.  If he tolerates this well he may potentially be able to be started back on peritoneal dialysis in 1 to 2 weeks.  In the interim he will need to perform hemodialysis.  Therefore we will request vascular surgery to place a PermCath tomorrow.  We will also plan for hemodialysis session tomorrow.  If he has recurrence of his obstruction with advancing diet however he will need his peritoneal dialysis catheter to be taken out.   LOS: 3 Alexius Hangartner 6/2/201912:45 PM

## 2018-05-06 NOTE — Progress Notes (Signed)
[facility] [place] NAME: Matthew Mathis    MR#:  [id number]  DATE OF BIRTH:  February 27, 1961  SUBJECTIVE:   Patient denies abdominal pain.  Tolerating clear liquid diet, denies any nausea or vomiting he does say that he passed flatulence and had a bowel movement, No NG tube  REVIEW OF SYSTEMS:    Review of Systems  Constitutional: Negative for fever, chills weight loss HENT: Negative for ear pain, nosebleeds, congestion, facial swelling, rhinorrhea, neck pain, neck stiffness and ear discharge.   Respiratory: Negative for cough, shortness of breath, wheezing  Cardiovascular: Negative for chest pain, palpitations and leg swelling.  Gastrointestinal: Negative for heartburn, no abdominal pain,NO  vomiting, diarrhea or consitpation Genitourinary: Negative for dysuria, urgency, frequency, hematuria Musculoskeletal: Negative for back pain or joint pain Neurological: Negative for dizziness, seizures, syncope, focal weakness,  numbness and headaches.  Hematological: Does not bruise/bleed easily.  Psychiatric/Behavioral: Negative for hallucinations, confusion, dysphoric mood  Tolerating Diet:NPO  DRUG ALLERGIES:  No Known Allergies  VITALS:  Blood pressure 121/85, pulse 96, temperature 98.1 F (36.7 C), temperature source Oral, resp. rate 16, height 5\' 2"  (1.575 m), weight 77.1 kg (170 lb), SpO2 99 %.  PHYSICAL EXAMINATION:  Constitutional: Appears well-developed and well-nourished. No distress. HENT: Normocephalic. .NGT placed Eyes: Conjunctivae and EOM are normal. PERRLA, no scleral icterus.  Neck: Normal ROM. Neck supple. No JVD. No tracheal deviation. CVS: RRR, S1/S2 +, no murmurs, no gallops, no carotid bruit.  Pulmonary: Effort and breath sounds normal, no stridor, rhonchi, wheezes, rales.  Abdominal: Soft. BS +,  no distension, tenderness, rebound or guarding.  Right groin area with  temporary hemodialysis catheter  Musculoskeletal: Normal  range of motion. No edema and no tenderness.  Neuro: Alert. CN 2-12 grossly intact. No focal deficits. Skin: Skin is warm and dry. No rash noted. Psychiatric: Normal mood and affect.   Patient has temporary right femoral cath placed  LABORATORY PANEL:   CBC Recent Labs  Lab 05/04/18 0500  WBC 9.7  HGB 12.0*  HCT 35.5*  PLT 269   ------------------------------------------------------------------------------------------------------------------  Chemistries  Recent Labs  Lab 05/03/18 1521  05/05/18 0454  NA 137   < > 138  K 5.7*   < > 4.1  CL 89*   < > 95*  CO2 22   < > 25  GLUCOSE 171*   < > 68  BUN 100*   < > 54*  CREATININE 14.71*   < > 9.41*  CALCIUM 8.7*   < > 7.9*  AST 21  --   --   ALT 20  --   --   ALKPHOS 64  --   --   BILITOT 0.9  --   --    < > = values in this interval not displayed.   ------------------------------------------------------------------------------------------------------------------  Cardiac Enzymes No results for input(s): TROPONINI in the last 168 hours. ------------------------------------------------------------------------------------------------------------------  RADIOLOGY:  No results found.   ASSESSMENT AND PLAN:   57 year old male with end-stage renal disease on hemodialysis on peritoneal dialysis who presents to the emergency room due to abdominal pain.  1.Small bowel obstruction: Recurrent -secondary to indwelling peritoneal dialysis catheter  NG tube discontinued Patient clinically improving Plan is to advance diet as tolerated As per general surgery discussion with nephrology , plan is to anticipate refrain from using the peritoneal dialysis catheter at least 1 week pending clinical recuperation Continuing conservative management so far So far peritoneal fluid culture is negative.  I think we can discontinue cefepime since cultures remain negative.  2.  End-stage renal disease on hemodialysis: Patient to have  dialysis via temporary femoral catheter placed in the right groin  3.  Hyperkalemia: Improved with treatment  4.  History of gout: Continue allopurinol  Management plans discussed with the patient and he is in agreement.  CODE STATUS: Full  TOTAL TIME TAKING CARE OF THIS PATIENT: 32 minutes.     Nicholes Mango M.D on 05/06/2018 at 8:41 PM  Between 7am to 6pm - Pager - [phone] After 6pm go to [email or web address] - password [email or web address] [facility]  Office  [phone]  CC: Primary care physician; Care, [facility]  Note: This dictation was prepared with Dragon dictation along with smaller phrase technology. Any transcriptional errors that result from this process are unintentional.

## 2018-05-06 NOTE — Progress Notes (Signed)
SURGICAL PROGRESS NOTE (cpt [phone])  Hospital Day(s): 3.   Post op day(s):  [name]   Interval History: Patient seen and examined, no acute events or new complaints overnight. Patient reports continued resolution of his abdominal pain without any N/V or abdominal distention and tolerating clear liquids diet without any difficulty, +flatus and +BM, +ambulation.  Review of Systems:  Constitutional: denies fever, chills  HEENT: denies cough or congestion  Respiratory: denies any shortness of breath  Cardiovascular: denies chest pain or palpitations  Gastrointestinal: abdominal pain, N/V, and bowel function as per interval history Genitourinary: denies burning with urination or urinary frequency Musculoskeletal: denies pain, decreased motor or sensation Integumentary: denies any other rashes or skin discolorations Neurological: denies HA or vision/hearing changes   Vital signs in last 24 hours: [min-max] current  Temp:  [98.1 F (36.7 C)-98.5 F (36.9 C)] 98.1 F (36.7 C) (06/02 0448) Pulse Rate:  [96-105] 96 (06/02 0448) Resp:  [16-18] 16 (06/02 0448) BP: (115-121)/(85-86) 121/85 (06/02 0448) SpO2:  [99 %] 99 % (06/02 0448)     Height: 5\' 2"  (157.5 cm) Weight: 170 lb (77.1 kg) BMI (Calculated): 31.09   Intake/Output this shift:  No intake/output data recorded.   Intake/Output last 2 shifts:  @IOLAST2SHIFTS @   Physical Exam:  Constitutional: alert, cooperative and no distress  HENT: normocephalic without obvious abnormality  Eyes: PERRL, EOM's grossly intact and symmetric  Neuro: CN II - XII grossly intact and symmetric without deficit  Respiratory: breathing non-labored at rest  Cardiovascular: regular rate and sinus rhythm  Gastrointestinal: soft, non-tender, and non-distended, PD catheter well-secured without surrounding erythema or drainage Musculoskeletal: UE and LE FROM, no edema or wounds, motor and sensation grossly intact, NT   Labs:  CBC Latest Ref Rng & Units  05/04/2018 05/03/2018 05/24/2017  WBC 3.8 - 10.6 K/uL 9.7 11.9(H) -  Hemoglobin 13.0 - 18.0 g/dL 12.0(L) 13.4 10.9(L)  Hematocrit 40.0 - 52.0 % 35.5(L) 38.3(L) 32.0(L)  Platelets 150 - 440 K/uL 269 345 -   CMP Latest Ref Rng & Units 05/05/2018 05/04/2018 05/03/2018  Glucose 65 - 99 mg/dL 68 115(H) 171(H)  BUN 6 - 20 mg/dL 54(H) 103(H) 100(H)  Creatinine 0.61 - 1.24 mg/dL 9.41(H) 15.50(H) 14.71(H)  Sodium 135 - 145 mmol/L 138 139 137  Potassium 3.5 - 5.1 mmol/L 4.1 5.0 5.7(H)  Chloride 101 - 111 mmol/L 95(L) 93(L) 89(L)  CO2 22 - 32 mmol/L 25 22 22   Calcium 8.9 - 10.3 mg/dL 7.9(L) 8.1(L) 8.7(L)  Total Protein 6.5 - 8.1 g/dL - - 7.6  Total Bilirubin 0.3 - 1.2 mg/dL - - 0.9  Alkaline Phos 38 - 126 U/L - - 64  AST 15 - 41 U/L - - 21  ALT 17 - 63 U/L - - 20   Imaging studies: No new pertinent imaging studies   Assessment/Plan: (ICD-10's:K56.699) 57 y.o.malewith clinically improving recurrent small bowel obstruction associated with indwelling peritoneal dialysis catheter (radiographically worse than clinically), complicated by pertinent comorbidities includingHTN, ESRD on chronic peritoneal dialysis (prior short-term hemodialysis via tunneled catheter), and gout.              - advance diet as tolerated - monitor abdominal exam and bowel function - discussed with nephrologist, patient, and medical physician balance between resuming use of functional catheter per patient wishes and allowing for resolution... anticipate refrain from using peritoneal dialysis catheter at least 1 week pending clinical recovery  - if does well with advancement of diet, anticipate okay for discharge tomorrow from surgical  perspective - medical management of comorbidities as per medical team - DVT prophylaxis, ambulation encouraged  All of the above findings and recommendations were discussed with the patient and his family/friend at bedside, and all of patient's  questions were answered to his expressed satisfaction.  Thank you for the opportunity to participate in this patient's care.  -- Marilynne Drivers Rosana Hoes, MD, [place]: [facility] - Partnering for exceptional care. Office: [phone]

## 2018-05-07 ENCOUNTER — Inpatient Hospital Stay
Admission: EM | Disposition: A | Discharge status: Home / Self Care | Payer: Self-pay | Source: Home / Self Care | Attending: Internal Medicine

## 2018-05-07 DIAGNOSIS — N186 End stage renal disease: Secondary | ICD-10-CM

## 2018-05-07 DIAGNOSIS — K5651 Intestinal adhesions [bands], with partial obstruction: Secondary | ICD-10-CM

## 2018-05-07 HISTORY — PX: DIALYSIS/PERMA CATHETER INSERTION: CATH118288

## 2018-05-07 LAB — CBC
HEMATOCRIT: 31.6 % — AB (ref 40.0–52.0)
Hemoglobin: 10.6 g/dL — ABNORMAL LOW (ref 13.0–18.0)
MCH: 33.1 pg (ref 26.0–34.0)
MCHC: 33.6 g/dL (ref 32.0–36.0)
MCV: 98.6 fL (ref 80.0–100.0)
PLATELETS: 196 10*3/uL (ref 150–440)
RBC: 3.21 MIL/uL — ABNORMAL LOW (ref 4.40–5.90)
RDW: 14.6 % — AB (ref 11.5–14.5)
WBC: 11.8 10*3/uL — AB (ref 3.8–10.6)

## 2018-05-07 LAB — BASIC METABOLIC PANEL
Anion gap: 21 — ABNORMAL HIGH (ref 5–15)
BUN: 77 mg/dL — ABNORMAL HIGH (ref 6–20)
CALCIUM: 8.1 mg/dL — AB (ref 8.9–10.3)
CO2: 21 mmol/L — AB (ref 22–32)
Chloride: 93 mmol/L — ABNORMAL LOW (ref 101–111)
Creatinine, Ser: 13.58 mg/dL — ABNORMAL HIGH (ref 0.61–1.24)
GFR, EST AFRICAN AMERICAN: 4 mL/min — AB (ref >60)
GFR, EST NON AFRICAN AMERICAN: 3 mL/min — AB (ref >60)
GLUCOSE: 106 mg/dL — AB (ref 65–99)
Potassium: 4.1 mmol/L (ref 3.5–5.1)
Sodium: 135 mmol/L (ref 135–145)

## 2018-05-07 LAB — BODY FLUID CULTURE
Culture: NO GROWTH
Special Requests: NORMAL

## 2018-05-07 SURGERY — DIALYSIS/PERMA CATHETER INSERTION
Anesthesia: Moderate Sedation

## 2018-05-07 MED ORDER — VANCOMYCIN HCL IN DEXTROSE 750-5 MG/150ML-% IV SOLN
750.0000 mg | INTRAVENOUS | Status: DC | PRN
  Administered 2018-05-07: 750 mg via INTRAVENOUS
  Filled 2018-05-07 (×2): qty 150

## 2018-05-07 MED ORDER — HEPARIN SODIUM (PORCINE) 10000 UNIT/ML IJ SOLN
INTRAMUSCULAR | Status: AC
  Filled 2018-05-07: qty 1

## 2018-05-07 MED ORDER — MIDAZOLAM HCL 2 MG/2ML IJ SOLN
INTRAMUSCULAR | Status: DC | PRN
  Administered 2018-05-07 (×2): 2 mg via INTRAVENOUS

## 2018-05-07 MED ORDER — FENTANYL CITRATE (PF) 100 MCG/2ML IJ SOLN
INTRAMUSCULAR | Status: AC
  Filled 2018-05-07: qty 2

## 2018-05-07 MED ORDER — LIDOCAINE-EPINEPHRINE (PF) 1 %-1:200000 IJ SOLN
INTRAMUSCULAR | Status: DC | PRN
  Administered 2018-05-07: 15 mL

## 2018-05-07 MED ORDER — MIDAZOLAM HCL 2 MG/2ML IJ SOLN
INTRAMUSCULAR | Status: AC
  Filled 2018-05-07: qty 4

## 2018-05-07 MED ORDER — SEVELAMER CARBONATE 800 MG PO TABS
1600.0000 mg | ORAL_TABLET | Freq: Three times a day (TID) | ORAL | Status: DC
  Administered 2018-05-07 – 2018-05-09 (×6): 1600 mg via ORAL
  Filled 2018-05-07 (×7): qty 2

## 2018-05-07 MED ORDER — CEFAZOLIN SODIUM-DEXTROSE 2-4 GM/100ML-% IV SOLN
2.0000 g | INTRAVENOUS | Status: DC
  Filled 2018-05-07: qty 100

## 2018-05-07 MED ORDER — HEPARIN (PORCINE) IN NACL 1000-0.9 UT/500ML-% IV SOLN
INTRAVENOUS | Status: AC
  Filled 2018-05-07: qty 500

## 2018-05-07 MED ORDER — FENTANYL CITRATE (PF) 100 MCG/2ML IJ SOLN
INTRAMUSCULAR | Status: DC | PRN
  Administered 2018-05-07 (×2): 50 ug via INTRAVENOUS

## 2018-05-07 MED ORDER — LIDOCAINE-EPINEPHRINE (PF) 1 %-1:200000 IJ SOLN
INTRAMUSCULAR | Status: AC
  Filled 2018-05-07: qty 30

## 2018-05-07 MED ORDER — DIPHENHYDRAMINE HCL 50 MG/ML IJ SOLN
25.0000 mg | Freq: Once | INTRAMUSCULAR | Status: AC
  Administered 2018-05-07: 25 mg via INTRAVENOUS

## 2018-05-07 MED ORDER — CHLORHEXIDINE GLUCONATE CLOTH 2 % EX PADS
6.0000 | MEDICATED_PAD | Freq: Every day | CUTANEOUS | Status: DC
  Administered 2018-05-08: 6 via TOPICAL

## 2018-05-07 MED ORDER — SODIUM CHLORIDE 0.9 % IV SOLN
INTRAVENOUS | Status: DC
  Administered 2018-05-07: 14:00:00 via INTRAVENOUS

## 2018-05-07 MED ORDER — CEFAZOLIN SODIUM-DEXTROSE 1-4 GM/50ML-% IV SOLN
1.0000 g | Freq: Once | INTRAVENOUS | Status: AC
Start: 2018-05-07 — End: 2018-05-07
  Administered 2018-05-07: 1 g via INTRAVENOUS

## 2018-05-07 SURGICAL SUPPLY — 6 items
CATH PALINDROME RT-P 15FX19CM (CATHETERS) ×3 IMPLANT
DERMABOND ADVANCED (GAUZE/BANDAGES/DRESSINGS) ×2
DERMABOND ADVANCED .7 DNX12 (GAUZE/BANDAGES/DRESSINGS) ×1 IMPLANT
PACK ANGIOGRAPHY (CUSTOM PROCEDURE TRAY) ×3 IMPLANT
SUT MNCRL AB 4-0 PS2 18 (SUTURE) ×3 IMPLANT
SUT PROLENE 0 CT 1 30 (SUTURE) ×3 IMPLANT

## 2018-05-07 NOTE — Progress Notes (Signed)
HD tx end    05/07/18 1246  Vital Signs  Pulse Rate (!) 112  Pulse Rate Source Monitor  Resp 18  BP 119/79  BP Location Right Arm  BP Method Automatic  Patient Position (if appropriate) Lying  Oxygen Therapy  SpO2 100 %  O2 Device Room Air  During Hemodialysis Assessment  Blood Flow Rate (mL/min) 300 mL/min  Arterial Pressure (mmHg) -160 mmHg  Venous Pressure (mmHg) 130 mmHg  Transmembrane Pressure (mmHg) 70 mmHg  Ultrafiltration Rate (mL/min) 440 mL/min  Dialysate Flow Rate (mL/min) 800 ml/min  Conductivity: Machine  14.2  HD Safety Checks Performed Yes  Dialysis Fluid Bolus Normal Saline  Bolus Amount (mL) 100 mL (unable to rinse pt's venous line)  Intra-Hemodialysis Comments Tx completed (1461)

## 2018-05-07 NOTE — Progress Notes (Signed)
Specials not ready for pt, he will go back to his room until they are ready.    05/07/18 1309  Hand-Off documentation  Report given to (Full Name) Delora Fuel  Report received from (Full Name) Antonietta Barcelona, RN  Vital Signs  Pulse Rate (!) 109  Resp 17  Oxygen Therapy  SpO2 99 %

## 2018-05-07 NOTE — Progress Notes (Addendum)
Patient returned from vascular lab.  Patient is asking about when he will have his next dialysis session.  Dr Juleen China questioned about this and said that patient will have hemodialysis here tomorrow,  Tue 05/08/17 and at the dialysis center 05/09/18.  Patient informed of this

## 2018-05-07 NOTE — H&P (Signed)
[facility] History & Physical Update  The patient was interviewed and re-examined.  The patient's previous History and Physical has been reviewed and is unchanged.  There is no change in the plan of care. We plan to proceed with the scheduled procedure.  Leotis Pain, MD  05/07/2018, 2:34 PM

## 2018-05-07 NOTE — Progress Notes (Signed)
CC: sbo Subjective: This patient admitted with a small bowel obstruction.  He has had multiple episodes like this probably associated with his PD catheter.  Today he is feeling much better has no nausea vomiting fevers or chills nasogastric tube was removed yesterday he is passing gas and having bowel movements.  Objective: Vital signs in last 24 hours: Temp:  [97.8 F (36.6 C)-99.1 F (37.3 C)] 99.1 F (37.3 C) (06/03 0502) Pulse Rate:  [101-112] 112 (06/03 0502) Resp:  [20-21] 20 (06/03 0502) BP: (127-139)/(85-87) 127/85 (06/03 0502) SpO2:  [99 %-100 %] 99 % (06/03 0502) Last BM Date: 05/06/18  Intake/Output from previous day: 06/02 0701 - 06/03 0700 In: -  Out: 200 [Urine:200] Intake/Output this shift: No intake/output data recorded.  Physical exam:  Awake alert and oriented abdomen is soft nontender nondistended nontympanitic PD catheter in place No icterus no jaundice Nontender calves moderate edema  Lab Results: CBC  Recent Labs    05/07/18 0510  WBC 11.8*  HGB 10.6*  HCT 31.6*  PLT 196   BMET Recent Labs    05/05/18 0454 05/07/18 0510  NA 138 135  K 4.1 4.1  CL 95* 93*  CO2 25 21*  GLUCOSE 68 106*  BUN 54* 77*  CREATININE 9.41* 13.58*  CALCIUM 7.9* 8.1*   PT/INR No results for input(s): LABPROT, INR in the last 72 hours. ABG No results for input(s): PHART, HCO3 in the last 72 hours.  Invalid input(s): PCO2, PO2  Studies/Results: No results found.  Anti-infectives: Anti-infectives (From admission, onward)   Start     Dose/Rate Route Frequency Ordered Stop   05/07/18 0810  vancomycin (VANCOCIN) IVPB 750 mg/150 ml premix     750 mg 150 mL/hr over 60 Minutes Intravenous Every Dialysis 05/07/18 0812     05/05/18 1000  ceFEPIme (MAXIPIME) 2 g in sodium chloride 0.9 % 100 mL IVPB  Status:  Discontinued     2 g 200 mL/hr over 30 Minutes Intravenous Every 48 hours 05/03/18 1739 05/05/18 1014   05/03/18 1745  vancomycin (VANCOCIN) 1,750 mg in  sodium chloride 0.9 % 500 mL IVPB  Status:  Discontinued     1,750 mg 250 mL/hr over 120 Minutes Intravenous  Once 05/03/18 1737 05/03/18 1743   05/03/18 1745  vancomycin (VANCOCIN) 2,000 mg in sodium chloride 0.9 % 500 mL IVPB     2,000 mg 250 mL/hr over 120 Minutes Intravenous  Once 05/03/18 1743 05/04/18 1454   05/03/18 1545  vancomycin (VANCOCIN) 1,250 mg in sodium chloride 0.9 % 250 mL IVPB  Status:  Discontinued     1,250 mg 166.7 mL/hr over 90 Minutes Intravenous  Once 05/03/18 1543 05/03/18 1737   05/03/18 1545  ceFEPIme (MAXIPIME) 2 g in sodium chloride 0.9 % 100 mL IVPB     2 g 200 mL/hr over 30 Minutes Intravenous STAT 05/03/18 1543 05/03/18 1653      Assessment/Plan:  Clinically patient has resolved a small bowel obstruction.  I would recommend advancing diet and discharge per the decision by prime doc.  He should follow-up with his primary care physician and the surgeon who placed the PD catheter.  No need to follow-up with [facility] at this time.  Florene Glen, MD, FACS  05/07/2018

## 2018-05-07 NOTE — Op Note (Signed)
OPERATIVE NOTE    PRE-OPERATIVE DIAGNOSIS: 1. ESRD   POST-OPERATIVE DIAGNOSIS: same as above  PROCEDURE: 1. Ultrasound guidance for vascular access to the right internal jugular vein 2. Fluoroscopic guidance for placement of catheter 3. Placement of a 19 cm tip to cuff tunneled hemodialysis catheter via the right internal jugular vein  SURGEON: Leotis Pain, MD  ANESTHESIA:  Local with Moderate conscious sedation for approximately 15 minutes using 4 mg of Versed and 100 Mcg of Fentanyl  ESTIMATED BLOOD LOSS: 5 cc  FLUORO TIME: less than one minute  CONTRAST: none  FINDING(S): 1.  Patent right internal jugular vein  SPECIMEN(S):  None  INDICATIONS:   Matthew Mathis is a 57 y.o.male who presents with renal failure.  The patient needs long term dialysis access for their ESRD, and a Permcath is necessary.  Risks and benefits are discussed and informed consent is obtained.    DESCRIPTION: After obtaining full informed written consent, the patient was brought back to the vascular suited. The patient's right neck and chest were sterilely prepped and draped in a sterile surgical field was created. Moderate conscious sedation was administered during a face to face encounter with the patient throughout the procedure with my supervision of the RN administering medicines and monitoring the patient's vital signs, pulse oximetry, telemetry and mental status throughout from the start of the procedure until the patient was taken to the recovery room.  The right internal jugular vein was visualized with ultrasound and found to be patent. It was then accessed under direct ultrasound guidance and a permanent image was recorded. A wire was placed. After skin nick and dilatation, the peel-away sheath was placed over the wire. I then turned my attention to an area under the clavicle. Approximately 1-2 fingerbreadths below the clavicle a small counterincision was created and tunneled from the subclavicular  incision to the access site. Using fluoroscopic guidance, a 19 centimeter tip to cuff tunneled hemodialysis catheter was selected, and tunneled from the subclavicular incision to the access site. It was then placed through the peel-away sheath and the peel-away sheath was removed. Using fluoroscopic guidance the catheter tips were parked in the right atrium. The appropriate distal connectors were placed. It withdrew blood well and flushed easily with heparinized saline and a concentrated heparin solution was then placed. It was secured to the chest wall with 2 Prolene sutures. The access incision was closed single 4-0 Monocryl. A 4-0 Monocryl pursestring suture was placed around the exit site. Sterile dressings were placed. The patient tolerated the procedure well and was taken to the recovery room in stable condition.  COMPLICATIONS: None  CONDITION: Stable  Leotis Pain, MD 05/07/2018 3:26 PM   This note was created with Dragon Medical transcription system. Any errors in dictation are purely unintentional.

## 2018-05-07 NOTE — Progress Notes (Signed)
HD tx start    05/07/18 5997  Vital Signs  Pulse Rate (!) 108  Pulse Rate Source Monitor  Resp 17  BP 124/80  BP Location Right Arm  BP Method Automatic  Patient Position (if appropriate) Lying  Oxygen Therapy  SpO2 97 %  O2 Device Room Air  During Hemodialysis Assessment  Blood Flow Rate (mL/min) 400 mL/min  Arterial Pressure (mmHg) -220 mmHg  Venous Pressure (mmHg) 160 mmHg  Transmembrane Pressure (mmHg) 60 mmHg  Ultrafiltration Rate (mL/min) 570 mL/min  Dialysate Flow Rate (mL/min) 800 ml/min  Conductivity: Machine  14.2  HD Safety Checks Performed Yes  Dialysis Fluid Bolus Normal Saline  Bolus Amount (mL) 250 mL  Intra-Hemodialysis Comments Tx initiated  Hemodialysis Catheter Superior Femoral vein Triple-lumen  Placement Date/Time: 05/03/18 1700   Placed prior to admission: No  Orientation: Superior  Access Location: Femoral vein  Hemodialysis Catheter Type: Triple-lumen  Blue Lumen Status Infusing  Red Lumen Status Infusing

## 2018-05-07 NOTE — Progress Notes (Signed)
Pre HD assessment    05/07/18 0912  Neurological  Level of Consciousness Alert  Orientation Level Oriented X4  Respiratory  Respiratory Pattern Regular;Unlabored  Chest Assessment Chest expansion symmetrical  Cardiac  ECG Monitor Yes  Antiarrhythmic device No  Vascular  R Radial Pulse +2  L Radial Pulse +2  Integumentary  Integumentary (WDL) X  Skin Color Appropriate for ethnicity  Musculoskeletal  Musculoskeletal (WDL) X  Generalized Weakness Yes  Assistive Device None  GU Assessment  Genitourinary (WDL) X  Genitourinary Symptoms  (HD)  Psychosocial  Psychosocial (WDL) WDL

## 2018-05-07 NOTE — Progress Notes (Signed)
Post HD assessment    05/07/18 1308  Neurological  Level of Consciousness Alert  Orientation Level Oriented X4  Respiratory  Respiratory Pattern Regular;Unlabored  Chest Assessment Chest expansion symmetrical  Cardiac  ECG Monitor Yes  Antiarrhythmic device No  Vascular  R Radial Pulse +2  L Radial Pulse +2  Integumentary  Integumentary (WDL) X  Skin Color Appropriate for ethnicity  Musculoskeletal  Musculoskeletal (WDL) X  Generalized Weakness Yes  Assistive Device None  GU Assessment  Genitourinary (WDL) X  Genitourinary Symptoms  (HD)  Psychosocial  Psychosocial (WDL) WDL

## 2018-05-07 NOTE — Progress Notes (Signed)
Pre HD assessment   05/07/18 0911  Vital Signs  Temp 98.7 F (37.1 C)  Temp Source Oral  Pulse Rate (!) 102  Pulse Rate Source Monitor  Resp 20  BP 136/87  BP Location Right Arm  BP Method Automatic  Patient Position (if appropriate) Lying  Oxygen Therapy  SpO2 98 %  O2 Device Room Air  Pain Assessment  Pain Scale 0-10  Pain Score 0  Dialysis Weight  Weight 72.4 kg (159 lb 9.8 oz)  Type of Weight Pre-Dialysis  Time-Out for Hemodialysis  What Procedure? HD  Pt Identifiers(min of two) First/Last Name;MRN/Account#  Correct Site? Yes  Correct Side? Yes  Correct Procedure? Yes  Consents Verified? Yes  Rad Studies Available? N/A  Safety Precautions Reviewed? Yes  Engineer, civil (consulting) Number  (5A)  Station Number 3  UF/Alarm Test Passed  Conductivity: Meter 13.8  Conductivity: Machine  14.1  pH 7.4  Reverse Osmosis main  Normal Saline Lot Number [id number]  Dialyzer Lot Number [id number]  Disposable Set Lot Number [id number]  Machine Temperature 98.6 F (37 C)  Musician and Audible Yes  Blood Lines Intact and Secured Yes  Pre Treatment Patient Checks  Vascular access used during treatment Catheter  Hepatitis B Surface Antigen Results Negative  Date Hepatitis B Surface Antigen Drawn 03/06/18  Hepatitis B Surface Antibody  (>10)  Date Hepatitis B Surface Antibody Drawn 03/06/18  Hemodialysis Consent Verified Yes  Hemodialysis Standing Orders Initiated Yes  ECG (Telemetry) Monitor On Yes  Prime Ordered Normal Saline  Length of  DialysisTreatment -hour(s) 3.5 Hour(s)  Dialyzer Elisio 17H NR  Dialysate 2K, 2.5 Ca  Dialysis Anticoagulant None  Dialysate Flow Ordered 800  Blood Flow Rate Ordered 400 mL/min  Ultrafiltration Goal 1.5 Liters  Pre Treatment Labs CBC;Renal panel  Dialysis Blood Pressure Support Ordered Normal Saline  Education / Care Plan  Dialysis Education Provided Yes  Documented Education in Care Plan Yes  Hemodialysis Catheter Superior  Femoral vein Triple-lumen  Placement Date/Time: 05/03/18 1700   Placed prior to admission: No  Orientation: Superior  Access Location: Femoral vein  Hemodialysis Catheter Type: Triple-lumen  Site Condition No complications  Blue Lumen Status Heparin locked  Red Lumen Status Heparin locked  Purple Lumen Status N/A  Dressing Type Biopatch  Dressing Status Clean;Dry;Intact  Drainage Description None

## 2018-05-07 NOTE — Progress Notes (Signed)
Patient returned from hemodialysis but just now taken to vascular lab for his perm cath placement

## 2018-05-07 NOTE — Progress Notes (Signed)
Post HD assessment. Pt tolerated tx well without c/o. Pt had air in the lines and was unable to finish the last 28 minutes of tx,MD aware. Pt to got to specials directly after HD tx. Net UF 811, goal not met.    05/07/18 1300  Vital Signs  Temp 98.6 F (37 C)  Temp Source Oral  Pulse Rate (!) 111  Pulse Rate Source Monitor  Resp 18  BP 123/81  BP Location Right Arm  BP Method Automatic  Patient Position (if appropriate) Lying  Oxygen Therapy  SpO2 100 %  O2 Device Room Air  Dialysis Weight  Weight 71.8 kg (158 lb 4.6 oz)  Type of Weight Post-Dialysis  Post-Hemodialysis Assessment  Rinseback Volume (mL) 100 mL  KECN 59.1 V  Dialyzer Clearance Lightly streaked  Duration of HD Treatment -hour(s) 3 hour(s)  Hemodialysis Intake (mL) 650 mL  UF Total -Machine (mL) 1461 mL  Net UF (mL) 811 mL  Tolerated HD Treatment Yes  Hemodialysis Catheter Superior Femoral vein Triple-lumen  Placement Date/Time: 05/03/18 1700   Placed prior to admission: No  Orientation: Superior  Access Location: Femoral vein  Hemodialysis Catheter Type: Triple-lumen  Site Condition No complications  Blue Lumen Status Heparin locked  Red Lumen Status Heparin locked  Purple Lumen Status N/A  Catheter fill solution Heparin 1000 units/ml  Catheter fill volume (Arterial) 1.8 cc  Catheter fill volume (Venous) 1.8  Dressing Type Biopatch  Dressing Status Clean;Dry;Intact  Drainage Description None  Post treatment catheter status Capped and Clamped

## 2018-05-07 NOTE — Progress Notes (Signed)
[facility] [place] NAME: Matthew Mathis    MR#:  [id number]  DATE OF BIRTH:  1961-09-20  SUBJECTIVE:   Patient denies abdominal pain.  Tolerating advanced diet, denies any nausea or vomiting he does say that he passed flatulence and had a bowel movement, patient had hemodialysis today and permanent dialysis catheter placement today No NG tube  REVIEW OF SYSTEMS:    Review of Systems  Constitutional: Negative for fever, chills weight loss HENT: Negative for ear pain, nosebleeds, congestion, facial swelling, rhinorrhea, neck pain, neck stiffness and ear discharge.   Respiratory: Negative for cough, shortness of breath, wheezing  Cardiovascular: Negative for chest pain, palpitations and leg swelling.  Gastrointestinal: Negative for heartburn, no abdominal pain,NO  vomiting, diarrhea or consitpation Genitourinary: Negative for dysuria, urgency, frequency, hematuria Musculoskeletal: Negative for back pain or joint pain Neurological: Negative for dizziness, seizures, syncope, focal weakness,  numbness and headaches.  Hematological: Does not bruise/bleed easily.  Psychiatric/Behavioral: Negative for hallucinations, confusion, dysphoric mood  Tolerating Diet:NPO  DRUG ALLERGIES:  No Known Allergies  VITALS:  Blood pressure 126/76, pulse (!) 117, temperature 98.4 F (36.9 C), temperature source Oral, resp. rate 18, height 5\' 2"  (1.575 m), weight 71.8 kg (158 lb 4.6 oz), SpO2 100 %.  PHYSICAL EXAMINATION:  Constitutional: Appears well-developed and well-nourished. No distress. HENT: Normocephalic. .NGT placed Eyes: Conjunctivae and EOM are normal. PERRLA, no scleral icterus.  Neck: Normal ROM. Neck supple. No JVD. No tracheal deviation. CVS: RRR, S1/S2 +, no murmurs, no gallops, no carotid bruit.  Pulmonary: Effort and breath sounds normal, no stridor, rhonchi, wheezes, rales.  Abdominal: Soft. BS +,  no distension, tenderness, rebound or  guarding.  Right groin area with  temporary hemodialysis catheter  Musculoskeletal: Normal range of motion. No edema and no tenderness.  Neuro: Alert. CN 2-12 grossly intact. No focal deficits. Skin: Skin is warm and dry. No rash noted. Psychiatric: Normal mood and affect.   Patient has temporary right femoral cath placed  LABORATORY PANEL:   CBC Recent Labs  Lab 05/07/18 0510  WBC 11.8*  HGB 10.6*  HCT 31.6*  PLT 196   ------------------------------------------------------------------------------------------------------------------  Chemistries  Recent Labs  Lab 05/03/18 1521  05/07/18 0510  NA 137   < > 135  K 5.7*   < > 4.1  CL 89*   < > 93*  CO2 22   < > 21*  GLUCOSE 171*   < > 106*  BUN 100*   < > 77*  CREATININE 14.71*   < > 13.58*  CALCIUM 8.7*   < > 8.1*  AST 21  --   --   ALT 20  --   --   ALKPHOS 64  --   --   BILITOT 0.9  --   --    < > = values in this interval not displayed.   ------------------------------------------------------------------------------------------------------------------  Cardiac Enzymes No results for input(s): TROPONINI in the last 168 hours. ------------------------------------------------------------------------------------------------------------------  RADIOLOGY:  No results found.   ASSESSMENT AND PLAN:   57 year old male with end-stage renal disease on hemodialysis on peritoneal dialysis who presents to the emergency room due to abdominal pain.  1.Small bowel obstruction: Recurrent -secondary to indwelling peritoneal dialysis catheter  NG tube discontinued Patient clinically improving Tolerating advanced diet As per general surgery discussion with nephrology , plan is to anticipate refrain from using the peritoneal dialysis catheter at least 1 week to 1 month pending clinical recuperation Continuing conservative  management so far So far peritoneal fluid culture is negative.   discontinue cefepime since cultures remain  negative.  2.  End-stage renal disease on hemodialysis: Patient had hemodialysis today Permanent hemodialysis catheter placed by vascular 05/07/2018  3.  Hyperkalemia: Improved with treatment  4.  History of gout: Continue allopurinol  Management plans discussed with the patient and he is in agreement.  CODE STATUS: Full  TOTAL TIME TAKING CARE OF THIS PATIENT: 33 minutes.     Nicholes Mango M.D on 05/07/2018 at 3:48 PM  Between 7am to 6pm - Pager - [phone] After 6pm go to [email or web address] - password [email or web address] [facility]  Office  [phone]  CC: Primary care physician; Care, [facility]  Note: This dictation was prepared with Dragon dictation along with smaller phrase technology. Any transcriptional errors that result from this process are unintentional.

## 2018-05-07 NOTE — Care Management Important Message (Signed)
Copy of signed IM left in patient's room.    

## 2018-05-07 NOTE — Progress Notes (Signed)
[facility]  ROUNDING NOTE   Subjective:   Seen and examined on hemodialysis. Tolerated treatment well.   Permcath to be placed later today.     HEMODIALYSIS FLOWSHEET:  Blood Flow Rate (mL/min): 300 mL/min Arterial Pressure (mmHg): -200 mmHg Venous Pressure (mmHg): 110 mmHg Transmembrane Pressure (mmHg): 70 mmHg Ultrafiltration Rate (mL/min): 320 mL/min Dialysate Flow Rate (mL/min): 800 ml/min Conductivity: Machine : 13.5 Conductivity: Machine : 13.5 Dialysis Fluid Bolus: Normal Saline Bolus Amount (mL): 250 mL   Objective:  Vital signs in last 24 hours:  Temp:  [97.8 F (36.6 C)-99.1 F (37.3 C)] 98.7 F (37.1 C) (06/03 0911) Pulse Rate:  [96-130] 122 (06/03 1130) Resp:  [17-21] 20 (06/03 1130) BP: (94-144)/(66-97) 123/78 (06/03 1130) SpO2:  [97 %-100 %] 100 % (06/03 1130) Weight:  [72.4 kg (159 lb 9.8 oz)] 72.4 kg (159 lb 9.8 oz) (06/03 0911)  Weight change:  Filed Weights   05/03/18 1520 05/07/18 0911  Weight: 77.1 kg (170 lb) 72.4 kg (159 lb 9.8 oz)    Intake/Output: I/O last 3 completed shifts: In: 0  Out: 300 [Urine:300]   Intake/Output this shift:  No intake/output data recorded.  Physical Exam: General: No acute distress  Head: Normocephalic, atraumatic. Moist oral mucosal membranes  Eyes: Anicteric  Neck: Supple, trachea midline  Lungs:  Clear to auscultation, normal effort  Heart: S1S2 no rubs  Abdomen:  Soft, NTND, BS present  Extremities: No peripheral edema.  Neurologic: Awake, alert, following commands  Skin: No lesions  Access: Right femoral temporary dialysis catheter, PD catheter    Basic Metabolic Panel: Recent Labs  Lab 05/03/18 1521 05/04/18 0500 05/05/18 0454 05/07/18 0510  NA 137 139 138 135  K 5.7* 5.0 4.1 4.1  CL 89* 93* 95* 93*  CO2 22 22 25  21*  GLUCOSE 171* 115* 68 106*  BUN 100* 103* 54* 77*  CREATININE 14.71* 15.50* 9.41* 13.58*  CALCIUM 8.7* 8.1* 7.9* 8.1*    Liver Function Tests: Recent  Labs  Lab 05/03/18 1521  AST 21  ALT 20  ALKPHOS 64  BILITOT 0.9  PROT 7.6  ALBUMIN 4.5   Recent Labs  Lab 05/03/18 1521  LIPASE 21   No results for input(s): AMMONIA in the last 168 hours.  CBC: Recent Labs  Lab 05/03/18 1521 05/04/18 0500 05/07/18 0510  WBC 11.9* 9.7 11.8*  HGB 13.4 12.0* 10.6*  HCT 38.3* 35.5* 31.6*  MCV 97.8 99.2 98.6  PLT 345 269 196    Cardiac Enzymes: No results for input(s): CKTOTAL, CKMB, CKMBINDEX, TROPONINI in the last 168 hours.  BNP: Invalid input(s): POCBNP  CBG: No results for input(s): GLUCAP in the last 168 hours.  Microbiology: Results for orders placed or performed during the hospital encounter of 05/03/18  Blood Culture (routine x 2)     Status: None (Preliminary result)   Collection Time: 05/03/18  3:48 PM  Result Value Ref Range Status   Specimen Description BLOOD LT [facility]  Final   Special Requests   Final    BOTTLES DRAWN AEROBIC AND ANAEROBIC Blood Culture adequate volume   Culture   Final    NO GROWTH 3 DAYS Performed at [facility], [address], [place], [place] [address]    Report Status PENDING  Incomplete  Blood Culture (routine x 2)     Status: None (Preliminary result)   Collection Time: 05/03/18  3:57 PM  Result Value Ref Range Status   Specimen Description BLOOD RT [facility]  Final   Special Requests   Final    BOTTLES DRAWN AEROBIC AND ANAEROBIC Blood Culture adequate volume   Culture   Final    NO GROWTH 3 DAYS Performed at [facility], [address] [place], [place] [address]    Report Status PENDING  Incomplete  Body fluid culture     Status: None   Collection Time: 05/03/18  4:23 PM  Result Value Ref Range Status   Specimen Description   Final    PERITONEAL CAVITY Performed at [facility], [address] [place], [place] [address]    Special Requests   Final    Normal Performed at [facility], [address] [place], [place] [address]    Gram Stain    Final    RARE WBC PRESENT, PREDOMINANTLY PMN NO ORGANISMS SEEN    Culture   Final    NO GROWTH 3 DAYS Performed at [facility], [place] [address] [place], [place] [address]    Report Status 05/07/2018 FINAL  Final    Coagulation Studies: No results for input(s): LABPROT, INR in the last 72 hours.  Urinalysis: No results for input(s): COLORURINE, LABSPEC, PHURINE, GLUCOSEU, HGBUR, BILIRUBINUR, KETONESUR, PROTEINUR, UROBILINOGEN, NITRITE, LEUKOCYTESUR in the last 72 hours.  Invalid input(s): APPERANCEUR    Imaging: No results found.   Medications:   .  ceFAZolin (ANCEF) IV    . vancomycin     . allopurinol  100 mg Oral QPM  . heparin  5,000 Units Subcutaneous Q8H  . mupirocin ointment   Topical BID   acetaminophen **OR** acetaminophen, vancomycin  Assessment/ Plan:  57 y.o. Asian male with ESRD on PD, anemia of chronic kidney disease, secondary hyperparathyroidism, gout, history of small bowel obstruction   1.  ESRD on PD. Transitioning to hemodialysis. Seen and examined on hemodialysis. Tolerating treatment today.  Complication of PD catheter/dialysis device.  Outpatient planning for hemodialysis at [facility].  Hopefully patient will be able to transition back to peritoneal dialysis in a few weeks.  Permcath for later today.  Appreciate surgery input.   2. Hypertension: blood pressure at goal. Tachycardia on examination. Home regimen of benazepril and furosemide. Currently being held.   3. Anemia of chronic kidney disease: hemoglobin 10.6. EPO as outpatient.   4. Secondary Hyperparathyroidism:  - restart binders when able to tolerate PO. Previously on calcium acetate 667mg  two tablets with meals tid  5.  Small bowel obstruction with PD catheter adjacent to area of obstruction - conservative management. Appreciate surgery input.    LOS: 4 Matthew Mathis 6/3/201911:38 AM

## 2018-05-07 NOTE — Progress Notes (Signed)
Patient transferred to dialysis via bed.

## 2018-05-07 NOTE — Progress Notes (Signed)
Patient complains of itching all over, refused for this RN to call MD for Benadryl order, "..It doesn't work..." Barbaraann Faster, RN 9:27 PM 05/07/2018

## 2018-05-08 ENCOUNTER — Encounter: Payer: Self-pay | Admitting: Vascular Surgery

## 2018-05-08 LAB — CULTURE, BLOOD (ROUTINE X 2)
Culture: NO GROWTH
Culture: NO GROWTH
SPECIAL REQUESTS: ADEQUATE
Special Requests: ADEQUATE

## 2018-05-08 LAB — CBC
HCT: 28.6 % — ABNORMAL LOW (ref 40.0–52.0)
Hemoglobin: 9.6 g/dL — ABNORMAL LOW (ref 13.0–18.0)
MCH: 33.2 pg (ref 26.0–34.0)
MCHC: 33.6 g/dL (ref 32.0–36.0)
MCV: 98.9 fL (ref 80.0–100.0)
PLATELETS: 184 10*3/uL (ref 150–440)
RBC: 2.89 MIL/uL — ABNORMAL LOW (ref 4.40–5.90)
RDW: 14.5 % (ref 11.5–14.5)
WBC: 9.1 10*3/uL (ref 3.8–10.6)

## 2018-05-08 MED ORDER — METOPROLOL SUCCINATE ER 50 MG PO TB24
50.0000 mg | ORAL_TABLET | Freq: Every day | ORAL | Status: DC
  Administered 2018-05-09: 50 mg via ORAL
  Filled 2018-05-08: qty 1

## 2018-05-08 MED ORDER — METOPROLOL TARTRATE 25 MG PO TABS
12.5000 mg | ORAL_TABLET | Freq: Once | ORAL | Status: AC
Start: 2018-05-08 — End: 2018-05-08
  Administered 2018-05-08: 12.5 mg via ORAL
  Filled 2018-05-08: qty 1

## 2018-05-08 MED ORDER — DIPHENHYDRAMINE HCL 25 MG PO CAPS
25.0000 mg | ORAL_CAPSULE | Freq: Once | ORAL | Status: AC
  Administered 2018-05-08: 25 mg via ORAL

## 2018-05-08 MED ORDER — METOPROLOL SUCCINATE ER 25 MG PO TB24
25.0000 mg | ORAL_TABLET | Freq: Every day | ORAL | Status: DC
  Administered 2018-05-08: 25 mg via ORAL
  Filled 2018-05-08: qty 1

## 2018-05-08 NOTE — Care Management (Signed)
Patient with confirmed with outpatient HD start time.  Schedule as follows TTS at [facility] 7:45 am.  Patient to start tomorrow.  Elvera Bicker dialysis liaison notified of discharge

## 2018-05-08 NOTE — Progress Notes (Signed)
Pre hd assessment performed at 0845. Patient currently denies pain/complaints.

## 2018-05-08 NOTE — Progress Notes (Signed)
HD completed without issue. Just shy of uf goal due to tachycardia during last 30 minutes of treatment, relieved with UF off. Total UF 862mL. Tolerated well. Complaints of itching, given benadryl with treatment. Report called to primary RN.

## 2018-05-08 NOTE — Progress Notes (Signed)
Found lab tubes at bedside; Patient does not have a line that blood can be drawn from, only HD catheters without purple line; Lab notified to come draw blood for am labs; acknowledged. Yaqueline Gutter K, RN5:58 AM 05/08/2018

## 2018-05-08 NOTE — Progress Notes (Signed)
HD initiated via R IJ tunneled HD catheter without issue. No heparin treatment. 1L goal as ordered. Patient currently without complaints. HD catheter flushes and aspirates easily.

## 2018-05-08 NOTE — Progress Notes (Signed)
[place] [facility] NAME: Matthew Mathis    MR#:  [id number]  DATE OF BIRTH:  1961-04-21  SUBJECTIVE:   Patient denies abdominal pain.  Tolerating advanced diet, patient is tachycardic but denies any symptoms, patient had hemodialysis today via permanent dialysis catheter which was placed on 05/07/2018  No NG tube  REVIEW OF SYSTEMS:    Review of Systems  Constitutional: Negative for fever, chills weight loss HENT: Negative for ear pain, nosebleeds, congestion, facial swelling, rhinorrhea, neck pain, neck stiffness and ear discharge.   Respiratory: Negative for cough, shortness of breath, wheezing  Cardiovascular: Negative for chest pain, palpitations and leg swelling.  Gastrointestinal: Negative for heartburn, no abdominal pain,NO  vomiting, diarrhea or consitpation Genitourinary: Negative for dysuria, urgency, frequency, hematuria Musculoskeletal: Negative for back pain or joint pain Neurological: Negative for dizziness, seizures, syncope, focal weakness,  numbness and headaches.  Hematological: Does not bruise/bleed easily.  Psychiatric/Behavioral: Negative for hallucinations, confusion, dysphoric mood  Tolerating Diet:NPO  DRUG ALLERGIES:  No Known Allergies  VITALS:  Blood pressure (!) 117/92, pulse (!) 125, temperature 98.7 F (37.1 C), temperature source Oral, resp. rate 16, height 5\' 2"  (1.575 m), weight 71 kg (156 lb 8.4 oz), SpO2 100 %.  PHYSICAL EXAMINATION:  Constitutional: Appears well-developed and well-nourished. No distress. HENT: Normocephalic. .NGT placed Eyes: Conjunctivae and EOM are normal. PERRLA, no scleral icterus.  Neck: Normal ROM. Neck supple. No JVD. No tracheal deviation. CVS: RRR, S1/S2 +, no murmurs, no gallops, no carotid bruit.  Pulmonary: Effort and breath sounds normal, no stridor, rhonchi, wheezes, rales.  Abdominal: Soft. BS +,  no distension, tenderness, rebound or guarding.  Right groin area with   temporary hemodialysis catheter  Musculoskeletal: Normal range of motion. No edema and no tenderness.  Neuro: Alert. CN 2-12 grossly intact. No focal deficits. Skin: Skin is warm and dry. No rash noted. Psychiatric: Normal mood and affect.   Patient has temporary right femoral cath placed  LABORATORY PANEL:   CBC Recent Labs  Lab 05/08/18 0637  WBC 9.1  HGB 9.6*  HCT 28.6*  PLT 184   ------------------------------------------------------------------------------------------------------------------  Chemistries  Recent Labs  Lab 05/03/18 1521  05/07/18 0510  NA 137   < > 135  K 5.7*   < > 4.1  CL 89*   < > 93*  CO2 22   < > 21*  GLUCOSE 171*   < > 106*  BUN 100*   < > 77*  CREATININE 14.71*   < > 13.58*  CALCIUM 8.7*   < > 8.1*  AST 21  --   --   ALT 20  --   --   ALKPHOS 64  --   --   BILITOT 0.9  --   --    < > = values in this interval not displayed.   ------------------------------------------------------------------------------------------------------------------  Cardiac Enzymes No results for input(s): TROPONINI in the last 168 hours. ------------------------------------------------------------------------------------------------------------------  RADIOLOGY:  No results found.   ASSESSMENT AND PLAN:   57 year old male with end-stage renal disease on hemodialysis on peritoneal dialysis who presents to the emergency room due to abdominal pain.  1.Small bowel obstruction: Recurrent -secondary to indwelling peritoneal dialysis catheter  Resolved Tolerating advanced diet As per general surgery discussion with nephrology , plan is to anticipate refrain from using the peritoneal dialysis catheter at least 1 week to 1 month pending clinical recuperation Discontinue IV antibiotics as peritoneal fluid cultures are negative   2.  End-stage renal disease on hemodialysis: Patient had hemodialysis today Permanent hemodialysis catheter placed by vascular  05/07/2018  3.  Sinus tachycardia Toprol XL 25 mg if he is on hold resumed and will increase the dose to 50 Get twelve-lead EKG   4.  History of gout: Continue allopurinol  Management plans discussed with the patient and he is in agreement.  CODE STATUS: Full  TOTAL TIME TAKING CARE OF THIS PATIENT: 33 minutes.     Nicholes Mango M.D on 05/08/2018 at 3:58 PM  Between 7am to 6pm - Pager - [phone] After 6pm go to [email or web address] - password [email or web address] [facility]  Office  [phone]  CC: Primary care physician; Care, [facility]  Note: This dictation was prepared with Dragon dictation along with smaller phrase technology. Any transcriptional errors that result from this process are unintentional.

## 2018-05-08 NOTE — Plan of Care (Signed)
Holding off on pts discharge due to tachycardia

## 2018-05-08 NOTE — Progress Notes (Signed)
[facility]  ROUNDING NOTE   Subjective:   Seen and examined on hemodialysis. RIJ permcath placed yesterday, 6/3 by Dr. Lucky Cowboy. Tolerating treatment well.   Objective:  Vital signs in last 24 hours:  Temp:  [97.5 F (36.4 C)-98.4 F (36.9 C)] 98.3 F (36.8 C) (06/04 1220) Pulse Rate:  [88-135] 116 (06/04 1220) Resp:  [14-26] 19 (06/04 1220) BP: (97-143)/(61-98) 126/86 (06/04 1220) SpO2:  [96 %-100 %] 100 % (06/04 1220) Weight:  [71 kg (156 lb 8.4 oz)-72.1 kg (158 lb 15.2 oz)] 71 kg (156 lb 8.4 oz) (06/04 1200)  Weight change:  Filed Weights   05/07/18 1300 05/08/18 0845 05/08/18 1200  Weight: 71.8 kg (158 lb 4.6 oz) 72.1 kg (158 lb 15.2 oz) 71 kg (156 lb 8.4 oz)    Intake/Output: I/O last 3 completed shifts: In: 290 [P.O.:240; IV Piggyback:50] Out: 6213 [Urine:300; Other:811]   Intake/Output this shift:  Total I/O In: -  Out: 849 [Other:849]  Physical Exam: General: No acute distress  Head: Normocephalic, atraumatic. Moist oral mucosal membranes  Eyes: Anicteric  Neck: Supple, trachea midline  Lungs:  Clear to auscultation, normal effort  Heart: S1S2 no rubs  Abdomen:  Soft, NTND, BS present  Extremities: No peripheral edema.  Neurologic: Awake, alert, following commands  Skin: No lesions  Access: Right femoral temporary dialysis catheter, PD catheter, RIJ permcath     Basic Metabolic Panel: Recent Labs  Lab 05/03/18 1521 05/04/18 0500 05/05/18 0454 05/07/18 0510  NA 137 139 138 135  K 5.7* 5.0 4.1 4.1  CL 89* 93* 95* 93*  CO2 22 22 25  21*  GLUCOSE 171* 115* 68 106*  BUN 100* 103* 54* 77*  CREATININE 14.71* 15.50* 9.41* 13.58*  CALCIUM 8.7* 8.1* 7.9* 8.1*    Liver Function Tests: Recent Labs  Lab 05/03/18 1521  AST 21  ALT 20  ALKPHOS 64  BILITOT 0.9  PROT 7.6  ALBUMIN 4.5   Recent Labs  Lab 05/03/18 1521  LIPASE 21   No results for input(s): AMMONIA in the last 168 hours.  CBC: Recent Labs  Lab 05/03/18 1521  05/04/18 0500 05/07/18 0510 05/08/18 0637  WBC 11.9* 9.7 11.8* 9.1  HGB 13.4 12.0* 10.6* 9.6*  HCT 38.3* 35.5* 31.6* 28.6*  MCV 97.8 99.2 98.6 98.9  PLT 345 269 196 184    Cardiac Enzymes: No results for input(s): CKTOTAL, CKMB, CKMBINDEX, TROPONINI in the last 168 hours.  BNP: Invalid input(s): POCBNP  CBG: No results for input(s): GLUCAP in the last 168 hours.  Microbiology: Results for orders placed or performed during the hospital encounter of 05/03/18  Blood Culture (routine x 2)     Status: None   Collection Time: 05/03/18  3:48 PM  Result Value Ref Range Status   Specimen Description BLOOD LT [facility]  Final   Special Requests   Final    BOTTLES DRAWN AEROBIC AND ANAEROBIC Blood Culture adequate volume   Culture   Final    NO GROWTH 5 DAYS Performed at [facility], [address] [place], [place] [address]    Report Status 05/08/2018 FINAL  Final  Blood Culture (routine x 2)     Status: None   Collection Time: 05/03/18  3:57 PM  Result Value Ref Range Status   Specimen Description BLOOD RT [facility]  Final   Special Requests   Final    BOTTLES DRAWN AEROBIC AND ANAEROBIC Blood Culture adequate volume   Culture   Final  NO GROWTH 5 DAYS Performed at [facility], [address] [place], [place] [address]    Report Status 05/08/2018 FINAL  Final  Body fluid culture     Status: None   Collection Time: 05/03/18  4:23 PM  Result Value Ref Range Status   Specimen Description   Final    PERITONEAL CAVITY Performed at [facility], [address] [place], [place] [address]    Special Requests   Final    Normal Performed at [facility], [address] [place], [place] [address]    Gram Stain   Final    RARE WBC PRESENT, PREDOMINANTLY PMN NO ORGANISMS SEEN    Culture   Final    NO GROWTH 3 DAYS Performed at [facility], [place] [address], [place],  [address]    Report Status 05/07/2018 FINAL  Final     Coagulation Studies: No results for input(s): LABPROT, INR in the last 72 hours.  Urinalysis: No results for input(s): COLORURINE, LABSPEC, PHURINE, GLUCOSEU, HGBUR, BILIRUBINUR, KETONESUR, PROTEINUR, UROBILINOGEN, NITRITE, LEUKOCYTESUR in the last 72 hours.  Invalid input(s): APPERANCEUR    Imaging: No results found.   Medications:   . vancomycin Stopped (05/07/18 1621)   . allopurinol  100 mg Oral QPM  . Chlorhexidine Gluconate Cloth  6 each Topical Q0600  . heparin  5,000 Units Subcutaneous Q8H  . metoprolol succinate  25 mg Oral Daily  . mupirocin ointment   Topical BID  . sevelamer carbonate  1,600 mg Oral TID WC   acetaminophen **OR** acetaminophen, vancomycin  Assessment/ Plan:  57 y.o. Asian male with ESRD on PD, anemia of chronic kidney disease, secondary hyperparathyroidism, gout, history of small bowel obstruction   1.  ESRD on PD. Transitioning to hemodialysis. Seen and examined on hemodialysis. Tolerating treatment today.  Complication of PD catheter/dialysis device.  Outpatient planning for hemodialysis at [facility].  Hopefully patient will be able to transition back to peritoneal dialysis in a few weeks.  Permcath placed. Remove right femoral temp catheter.  Appreciate surgery and vascular input.   2. Hypertension: blood pressure at goal. Tachycardia on examination. Home regimen of benazepril, metoprolol and furosemide. - Restart metoprolol  3. Anemia of chronic kidney disease: hemoglobin 9.6. EPO as outpatient.   4. Secondary Hyperparathyroidism: with hyperphosphatemia. Complains of itching. - Calcium acetate 667mg  two tablets with meals tid  5.  Small bowel obstruction with PD catheter adjacent to area of obstruction - conservative management. Appreciate surgery and vascular input.    LOS: 5 Rubbie Goostree 6/4/20191:13 PM

## 2018-05-08 NOTE — Progress Notes (Signed)
Post HD assessment unchanged  

## 2018-05-08 NOTE — Progress Notes (Signed)
Pre hd 

## 2018-05-09 LAB — CBC
HCT: 29.3 % — ABNORMAL LOW (ref 40.0–52.0)
Hemoglobin: 9.8 g/dL — ABNORMAL LOW (ref 13.0–18.0)
MCH: 33 pg (ref 26.0–34.0)
MCHC: 33.3 g/dL (ref 32.0–36.0)
MCV: 99.3 fL (ref 80.0–100.0)
PLATELETS: 192 10*3/uL (ref 150–440)
RBC: 2.95 MIL/uL — ABNORMAL LOW (ref 4.40–5.90)
RDW: 14.8 % — AB (ref 11.5–14.5)
WBC: 8 10*3/uL (ref 3.8–10.6)

## 2018-05-09 LAB — HEPATITIS B SURFACE ANTIBODY,QUALITATIVE: Hep B S Ab: REACTIVE

## 2018-05-09 LAB — HEPATITIS B SURFACE ANTIGEN: Hepatitis B Surface Ag: NEGATIVE

## 2018-05-09 LAB — HEPATITIS B CORE ANTIBODY, TOTAL: HEP B C TOTAL AB: NEGATIVE

## 2018-05-09 MED ORDER — SEVELAMER CARBONATE 800 MG PO TABS: 1600.0000 mg | ORAL_TABLET | Freq: Three times a day (TID) | ORAL | 0 refills | Status: DC

## 2018-05-09 MED ORDER — ACETAMINOPHEN 325 MG PO TABS: 650.0000 mg | ORAL_TABLET | Freq: Four times a day (QID) | ORAL | Status: DC | PRN

## 2018-05-09 MED ORDER — METOPROLOL SUCCINATE ER 50 MG PO TB24: 50.0000 mg | ORAL_TABLET | Freq: Every day | ORAL | 0 refills | Status: DC

## 2018-05-09 NOTE — Care Management Important Message (Signed)
Copy of signed IM left with patient in room.  

## 2018-05-09 NOTE — Progress Notes (Signed)
[facility]  Daily Progress Note   Subjective  - 2 Days Post-Op  Patient ambulating in hall denies abdominal pain  Objective Vitals:   05/08/18 1546 05/08/18 1547 05/08/18 2032 05/09/18 0512  BP:   122/89 124/82  Pulse: (!) 102 (!) 125 100 95  Resp:   17 20  Temp:   98 F (36.7 C) (!) 97.4 F (36.3 C)  TempSrc:   Oral Oral  SpO2:   100% 97%  Weight:      Height:        Intake/Output Summary (Last 24 hours) at 05/09/2018 1314 Last data filed at 05/09/2018 1025 Gross per 24 hour  Intake 120 ml  Output 200 ml  Net -80 ml    PULM  Normal effort , no use of accessory muscles CV  No JVD, RRR Abd      No distended, nontender VASC  tunnel catheter functioning well  Laboratory CBC    Component Value Date/Time   WBC 8.0 05/09/2018 0433   HGB 9.8 (L) 05/09/2018 0433   HGB 12.5 (L) 01/02/2013 1500   HCT 29.3 (L) 05/09/2018 0433   HCT 37.1 (L) 01/02/2013 1500   PLT 192 05/09/2018 0433   PLT 266 01/02/2013 1500    BMET    Component Value Date/Time   NA 135 05/07/2018 0510   NA 136 01/02/2013 1611   K 4.1 05/07/2018 0510   K 3.4 (L) 01/02/2013 1611   CL 93 (L) 05/07/2018 0510   CL 99 01/02/2013 1611   CO2 21 (L) 05/07/2018 0510   CO2 25 01/02/2013 1611   GLUCOSE 106 (H) 05/07/2018 0510   GLUCOSE 99 01/02/2013 1611   BUN 77 (H) 05/07/2018 0510   BUN 71 (H) 01/02/2013 1611   CREATININE 13.58 (H) 05/07/2018 0510   CREATININE 10.21 (H) 01/02/2013 1611   CALCIUM 8.1 (L) 05/07/2018 0510   CALCIUM 8.5 01/02/2013 1611   GFRNONAA 3 (L) 05/07/2018 0510   GFRNONAA 5 (L) 01/02/2013 1611   GFRAA 4 (L) 05/07/2018 0510   GFRAA 6 (L) 01/02/2013 1611    Assessment/Planning:  Small bowel obstruction secondary to PD catheter:    At the present time the patient will continue dialysis.  He will follow-up with me in the office and we will obtain vein mapping.  Dr. Juleen China and I have discussed his situation and Dr. Juleen China is continuing to discuss with the  patient.  1 reasonable argument is certainly for any person who is a candidate for a fistula that it typically does peritoneal dialysis should still have a fistula at the ready.  For this reason when he returns to see me in the office I will obtain vein mapping.  I defer to nephrology into the patient as to whether we will continue PD for now however given the multiple episodes of small bowel obstruction surrounding his PD catheter I would anticipate similar occurrences at some point in the future if we are to continue peritoneal dialysis.    Matthew Mathis  05/09/2018, 1:14 PM

## 2018-05-09 NOTE — Progress Notes (Signed)
[facility]  ROUNDING NOTE   Subjective:   Hemodialysis treatment yesterday. Tolerated treatment well.   Remained overnight due to sinus tachycardia. Restarted on metoprolol  Asymptomatic this morning.   Objective:  Vital signs in last 24 hours:  Temp:  [97.4 F (36.3 C)-98.7 F (37.1 C)] 97.4 F (36.3 C) (06/05 0512) Pulse Rate:  [95-135] 95 (06/05 0512) Resp:  [14-26] 20 (06/05 0512) BP: (97-136)/(65-93) 124/82 (06/05 0512) SpO2:  [97 %-100 %] 97 % (06/05 0512) Weight:  [71 kg (156 lb 8.4 oz)] 71 kg (156 lb 8.4 oz) (06/04 1200)  Weight change: -0.3 kg (-10.6 oz) Filed Weights   05/07/18 1300 05/08/18 0845 05/08/18 1200  Weight: 71.8 kg (158 lb 4.6 oz) 72.1 kg (158 lb 15.2 oz) 71 kg (156 lb 8.4 oz)    Intake/Output: I/O last 3 completed shifts: In: -  Out: 7902 [Urine:200; Other:849]   Intake/Output this shift:  No intake/output data recorded.  Physical Exam: General: No acute distress  Head: Normocephalic, atraumatic. Moist oral mucosal membranes  Eyes: Anicteric  Neck: Supple, trachea midline  Lungs:  Clear to auscultation, normal effort  Heart: S1S2 no rubs  Abdomen:  Soft, nontender  Extremities: No peripheral edema.  Neurologic: Awake, alert, following commands  Skin: No lesions  Access: Right femoral temporary dialysis catheter, PD catheter, RIJ permcath     Basic Metabolic Panel: Recent Labs  Lab 05/03/18 1521 05/04/18 0500 05/05/18 0454 05/07/18 0510  NA 137 139 138 135  K 5.7* 5.0 4.1 4.1  CL 89* 93* 95* 93*  CO2 22 22 25  21*  GLUCOSE 171* 115* 68 106*  BUN 100* 103* 54* 77*  CREATININE 14.71* 15.50* 9.41* 13.58*  CALCIUM 8.7* 8.1* 7.9* 8.1*    Liver Function Tests: Recent Labs  Lab 05/03/18 1521  AST 21  ALT 20  ALKPHOS 64  BILITOT 0.9  PROT 7.6  ALBUMIN 4.5   Recent Labs  Lab 05/03/18 1521  LIPASE 21   No results for input(s): AMMONIA in the last 168 hours.  CBC: Recent Labs  Lab 05/03/18 1521  05/04/18 0500 05/07/18 0510 05/08/18 0637 05/09/18 0433  WBC 11.9* 9.7 11.8* 9.1 8.0  HGB 13.4 12.0* 10.6* 9.6* 9.8*  HCT 38.3* 35.5* 31.6* 28.6* 29.3*  MCV 97.8 99.2 98.6 98.9 99.3  PLT 345 269 196 184 192    Cardiac Enzymes: No results for input(s): CKTOTAL, CKMB, CKMBINDEX, TROPONINI in the last 168 hours.  BNP: Invalid input(s): POCBNP  CBG: No results for input(s): GLUCAP in the last 168 hours.  Microbiology: Results for orders placed or performed during the hospital encounter of 05/03/18  Blood Culture (routine x 2)     Status: None   Collection Time: 05/03/18  3:48 PM  Result Value Ref Range Status   Specimen Description BLOOD LT [facility]  Final   Special Requests   Final    BOTTLES DRAWN AEROBIC AND ANAEROBIC Blood Culture adequate volume   Culture   Final    NO GROWTH 5 DAYS Performed at [facility], [address], [place], [place] [address]    Report Status 05/08/2018 FINAL  Final  Blood Culture (routine x 2)     Status: None   Collection Time: 05/03/18  3:57 PM  Result Value Ref Range Status   Specimen Description BLOOD RT [facility]  Final   Special Requests   Final    BOTTLES DRAWN AEROBIC AND ANAEROBIC Blood Culture adequate volume   Culture   Final  NO GROWTH 5 DAYS Performed at [facility], [address] [place], [place] [address]    Report Status 05/08/2018 FINAL  Final  Body fluid culture     Status: None   Collection Time: 05/03/18  4:23 PM  Result Value Ref Range Status   Specimen Description   Final    PERITONEAL CAVITY Performed at [facility], [address], [place], [place] [address]    Special Requests   Final    Normal Performed at [facility], [address] [place], [place] [address]    Gram Stain   Final    RARE WBC PRESENT, PREDOMINANTLY PMN NO ORGANISMS SEEN    Culture   Final    NO GROWTH 3 DAYS Performed at [facility], [address] [place], [place] [address]    Report  Status 05/07/2018 FINAL  Final    Coagulation Studies: No results for input(s): LABPROT, INR in the last 72 hours.  Urinalysis: No results for input(s): COLORURINE, LABSPEC, PHURINE, GLUCOSEU, HGBUR, BILIRUBINUR, KETONESUR, PROTEINUR, UROBILINOGEN, NITRITE, LEUKOCYTESUR in the last 72 hours.  Invalid input(s): APPERANCEUR    Imaging: No results found.   Medications:    . allopurinol  100 mg Oral QPM  . Chlorhexidine Gluconate Cloth  6 each Topical Q0600  . heparin  5,000 Units Subcutaneous Q8H  . metoprolol succinate  50 mg Oral Daily  . mupirocin ointment   Topical BID  . sevelamer carbonate  1,600 mg Oral TID WC   acetaminophen **OR** acetaminophen  Assessment/ Plan:  57 y.o. Asian male with ESRD on PD, anemia of chronic kidney disease, secondary hyperparathyroidism, gout, history of small bowel obstruction   1.  ESRD on PD. Transitioned to hemodialysis. TTS schedule Complication of PD catheter/dialysis device.   - Outpatient planning for hemodialysis at [place] - chair time of 7:45am - PD catheter second opinion as outpatient, Dr. Raul Del, [place] surgery and vascular input.   2. Hypertension: blood pressure at goal. Tachycardia yesterday. Home regimen of benazepril, metoprolol and furosemide.  3. Anemia of chronic kidney disease: hemoglobin 9.8. EPO as outpatient.   4. Secondary Hyperparathyroidism: with hyperphosphatemia. Complains of itching. - Calcium acetate 667mg  two tablets with meals tid  5.  Small bowel obstruction with PD catheter adjacent to area of obstruction - conservative management. Appreciate surgery and vascular input.  - PD catheter second opinion as outpatient, Dr. Raul Del, [facility]   LOS: 6 Lilibeth Opie 6/5/20199:42 AM

## 2018-05-09 NOTE — Discharge Instructions (Signed)
Follow-up with primary care physician in 5 days Follow-up with nephrology Dr. Zollie Scale on Thursday, 05/10/2018 and also start getting hemodialysis at [facility] clinic on 05/10/2018 at 7:45 AM

## 2018-05-09 NOTE — Progress Notes (Signed)
Matthew Mathis to be D/C'd Home per MD order.  Discussed prescriptions and follow up appointments with the patient. Prescriptions given to patient, medication list explained in detail. Pt verbalized understanding.  Allergies as of 05/09/2018   No Known Allergies     Medication List    STOP taking these medications   benazepril 10 MG tablet Commonly known as:  LOTENSIN   furosemide 80 MG tablet Commonly known as:  LASIX     TAKE these medications   acetaminophen 325 MG tablet Commonly known as:  TYLENOL Take 2 tablets (650 mg total) by mouth every 6 (six) hours as needed for mild pain (or Fever >/= 101).   allopurinol 100 MG tablet Commonly known as:  ZYLOPRIM Take 100 mg by mouth every evening.   calcium acetate 667 MG capsule Commonly known as:  PHOSLO Take 667-1,334 mg by mouth 3 (three) times daily. Take 2 capsules (1,334mg ) three times a day and 1 capsule with snacks.   cinacalcet 90 MG tablet Commonly known as:  SENSIPAR Take 90 mg by mouth at bedtime.   DIALYVITE 800/ULTRA D Tabs Take 1 tablet by mouth daily.   ketoconazole 2 % shampoo Commonly known as:  NIZORAL Apply 1 application topically 2 (two) times a week.   metoprolol succinate 50 MG 24 hr tablet Commonly known as:  TOPROL-XL Take 1 tablet (50 mg total) by mouth daily. Take with or immediately following a meal. Start taking on:  05/10/2018 What changed:    medication strength  how much to take  additional instructions   mupirocin ointment 2 % Commonly known as:  BACTROBAN APPLY A SMALL AMOUNT TO SKIN ONCE A DAY AS NEEDED APPLY TO EXIT SITE WITH DRESSING CHANGES   sevelamer carbonate 800 MG tablet Commonly known as:  RENVELA Take 2 tablets (1,600 mg total) by mouth 3 (three) times daily with meals.       Vitals:   05/09/18 0512 05/09/18 1414  BP: 124/82 117/81  Pulse: 95 93  Resp: 20 20  Temp: (!) 97.4 F (36.3 C) 97.7 F (36.5 C)  SpO2: 97% 97%    Skin clean, dry and intact without  evidence of skin break down, no evidence of skin tears noted. IV catheter discontinued intact. Site without signs and symptoms of complications. Dressing and pressure applied. Pt denies pain at this time. No complaints noted.  An After Visit Summary was printed and given to the patient. Patient escorted via [place], and D/C home via private auto.  Sharalyn Ink

## 2018-05-09 NOTE — Discharge Summary (Signed)
[facility] [place] NAME: Matthew Mathis    MR#:  [id number]  DATE OF BIRTH:  1961/02/14  DATE OF ADMISSION:  05/03/2018 ADMITTING PHYSICIAN: Loletha Grayer, MD  DATE OF DISCHARGE:  05/09/18  PRIMARY CARE PHYSICIAN: [facility]    ADMISSION DIAGNOSIS:  End stage renal disease ([place]) [N18.6] Hyperkalemia [E87.5] Small bowel obstruction ([place]) [K56.609]  DISCHARGE DIAGNOSIS:  Active Problems:   Bowel obstruction (HCC) ESRD  SECONDARY DIAGNOSIS:   Past Medical History:  Diagnosis Date  . Chronic renal failure    hx   . Gout    hx  . HTN (hypertension)     HOSPITAL COURSE:  hpi  Matthew Mathis  is a 57 y.o. male with a known history of end-stage renal disease on peritoneal dialysis.  The patient's been having abdominal pain since Tuesday.  Patient states the pain is 5 out of 10 intensity feels sharp at times.  It comes and goes.  Gets worse during the day and better at night when he is doing the peritoneal dialysis.  He did have a bowel movement yesterday.  The last time he passed gas was on Sunday.  Patient has had vomiting 10 times.  In the ER, he was found to have a bowel obstruction.  Case discussed with general surgery Dr. Rosana Hoes and he recommended not using the peritoneal dialysis catheter because the bowel obstruction is in that area.  Hospitalist services were contacted for further evaluation.  She is doing okay  1.Small bowel obstruction: Recurrent -secondary to indwelling peritoneal dialysis catheter  Resolved Tolerating advanced diet As per general surgery discussion with nephrology , plan is to anticipate refrain from using the peritoneal dialysis catheter at least 1 week to 1 month pending clinical recuperation Discontinue IV antibiotics as peritoneal fluid cultures are negative  Surgery signed off  2.  End-stage renal disease on hemodialysis: Patient had hemodialysis today Permanent hemodialysis catheter placed  by vascular 05/07/2018, patient had hemodialysis done yesterday using the new permanent dialysis catheter tolerated well To discharge patient from nephrology standpoint Patient has to follow-up with dialysis clinic it may been tomorrow 05/10/2018 at 7:45 AM and follow-up with Dr. Zollie Scale nephrology  3.  Sinus tachycardia Toprol XL 25 mg  Dose increased to 50 mg  Get twelve-lead EKG   4.  History of gout: Continue allopurinol  5.  Essential hypertension Toprol dose increased to 50 mg and lisinopril and Lasix are on hold   DISCHARGE CONDITIONS:   STABLE  CONSULTS OBTAINED:  Treatment Team:  Vickie Epley, MD Anthonette Legato, MD Schnier, Dolores Lory, MD Murray Hodgkins, MD   PROCEDURES temporary dialysis catheter was placed and removed Permanent hemodialysis catheter was placed in the right side of the neck  DRUG ALLERGIES:  No Known Allergies  DISCHARGE MEDICATIONS:   Allergies as of 05/09/2018   No Known Allergies     Medication List    STOP taking these medications   benazepril 10 MG tablet Commonly known as:  LOTENSIN   furosemide 80 MG tablet Commonly known as:  LASIX     TAKE these medications   acetaminophen 325 MG tablet Commonly known as:  TYLENOL Take 2 tablets (650 mg total) by mouth every 6 (six) hours as needed for mild pain (or Fever >/= 101).   allopurinol 100 MG tablet Commonly known as:  ZYLOPRIM Take 100 mg by mouth every evening.   calcium acetate 667 MG capsule Commonly known  as:  PHOSLO Take 667-1,334 mg by mouth 3 (three) times daily. Take 2 capsules (1,334mg ) three times a day and 1 capsule with snacks.   cinacalcet 90 MG tablet Commonly known as:  SENSIPAR Take 90 mg by mouth at bedtime.   DIALYVITE 800/ULTRA D Tabs Take 1 tablet by mouth daily.   ketoconazole 2 % shampoo Commonly known as:  NIZORAL Apply 1 application topically 2 (two) times a week.   metoprolol succinate 50 MG 24 hr tablet Commonly known as:   TOPROL-XL Take 1 tablet (50 mg total) by mouth daily. Take with or immediately following a meal. Start taking on:  05/10/2018 What changed:    medication strength  how much to take  additional instructions   mupirocin ointment 2 % Commonly known as:  BACTROBAN APPLY A SMALL AMOUNT TO SKIN ONCE A DAY AS NEEDED APPLY TO EXIT SITE WITH DRESSING CHANGES   sevelamer carbonate 800 MG tablet Commonly known as:  RENVELA Take 2 tablets (1,600 mg total) by mouth 3 (three) times daily with meals.        DISCHARGE INSTRUCTIONS:   Follow-up with primary care physician in 5 days Follow-up with nephrology Dr. Zollie Scale on Thursday, 05/10/2018 and also start getting hemodialysis at [facility] clinic on 05/10/2018 at 7:45 AM  DIET:  Renal diet  DISCHARGE CONDITION:  Stable  ACTIVITY:  Activity as tolerated  OXYGEN:  Home Oxygen: No.   Oxygen Delivery: room air  DISCHARGE LOCATION:  home   If you experience worsening of your admission symptoms, develop shortness of breath, life threatening emergency, suicidal or homicidal thoughts you must seek medical attention immediately by calling 911 or calling your MD immediately  if symptoms less severe.  You Must read complete instructions/literature along with all the possible adverse reactions/side effects for all the Medicines you take and that have been prescribed to you. Take any new Medicines after you have completely understood and accpet all the possible adverse reactions/side effects.   Please note  You were cared for by a hospitalist during your hospital stay. If you have any questions about your discharge medications or the care you received while you were in the hospital after you are discharged, you can call the unit and asked to speak with the hospitalist on call if the hospitalist that took care of you is not available. Once you are discharged, your primary care physician will handle any further medical issues. Please note that NO REFILLS for  any discharge medications will be authorized once you are discharged, as it is imperative that you return to your primary care physician (or establish a relationship with a primary care physician if you do not have one) for your aftercare needs so that they can reassess your need for medications and monitor your lab values.     Today  Chief Complaint  Patient presents with  . Abdominal Pain    Patient is doing fine.  Heart rate well controlled.  Okay to discharge patient from nephrology standpoint.  Surgery signed off   ROS:  CONSTITUTIONAL: Denies fevers, chills. Denies any fatigue, weakness.  EYES: Denies blurry vision, double vision, eye pain. EARS, NOSE, THROAT: Denies tinnitus, ear pain, hearing loss. RESPIRATORY: Denies cough, wheeze, shortness of breath.  CARDIOVASCULAR: Denies chest pain, palpitations, edema.  GASTROINTESTINAL: Denies nausea, vomiting, diarrhea, abdominal pain. Denies bright red blood per rectum. GENITOURINARY: Denies dysuria, hematuria. ENDOCRINE: Denies nocturia or thyroid problems. HEMATOLOGIC AND LYMPHATIC: Denies easy bruising or bleeding. SKIN: Denies rash or lesion.  MUSCULOSKELETAL: Denies pain in neck, back, shoulder, knees, hips or arthritic symptoms.  NEUROLOGIC: Denies paralysis, paresthesias.  PSYCHIATRIC: Denies anxiety or depressive symptoms.   VITAL SIGNS:  Blood pressure 124/82, pulse 95, temperature (!) 97.4 F (36.3 C), temperature source Oral, resp. rate 20, height 5\' 2"  (1.575 m), weight 71 kg (156 lb 8.4 oz), SpO2 97 %.  I/O:    Intake/Output Summary (Last 24 hours) at 05/09/2018 1107 Last data filed at 05/09/2018 1025 Gross per 24 hour  Intake 120 ml  Output 1049 ml  Net -929 ml    PHYSICAL EXAMINATION:  GENERAL:  57 y.o.-year-old patient lying in the bed with no acute distress.  EYES: Pupils equal, round, reactive to light and accommodation. No scleral icterus. Extraocular muscles intact.  HEENT: Head atraumatic,  normocephalic. Oropharynx and nasopharynx clear.  NECK:  Supple, no jugular venous distention. No thyroid enlargement, no tenderness.  LUNGS: Normal breath sounds bilaterally, no wheezing, rales,rhonchi or crepitation. No use of accessory muscles of respiration.  CARDIOVASCULAR: S1, S2 normal. No murmurs, rubs, or gallops.  ABDOMEN: Soft, non-tender, non-distended. Bowel sounds present. No organomegaly or mass.  EXTREMITIES: No pedal edema, cyanosis, or clubbing.  NEUROLOGIC: Cranial nerves II through XII are intact. Muscle strength 5/5 in all extremities. Sensation intact. Gait not checked.  PSYCHIATRIC: The patient is alert and oriented x 3.  SKIN: No obvious rash, lesion, or ulcer.   DATA REVIEW:   CBC Recent Labs  Lab 05/09/18 0433  WBC 8.0  HGB 9.8*  HCT 29.3*  PLT 192    Chemistries  Recent Labs  Lab 05/03/18 1521  05/07/18 0510  NA 137   < > 135  K 5.7*   < > 4.1  CL 89*   < > 93*  CO2 22   < > 21*  GLUCOSE 171*   < > 106*  BUN 100*   < > 77*  CREATININE 14.71*   < > 13.58*  CALCIUM 8.7*   < > 8.1*  AST 21  --   --   ALT 20  --   --   ALKPHOS 64  --   --   BILITOT 0.9  --   --    < > = values in this interval not displayed.    Cardiac Enzymes No results for input(s): TROPONINI in the last 168 hours.  Microbiology Results  Results for orders placed or performed during the hospital encounter of 05/03/18  Blood Culture (routine x 2)     Status: None   Collection Time: 05/03/18  3:48 PM  Result Value Ref Range Status   Specimen Description BLOOD LT [facility]  Final   Special Requests   Final    BOTTLES DRAWN AEROBIC AND ANAEROBIC Blood Culture adequate volume   Culture   Final    NO GROWTH 5 DAYS Performed at [facility], [address] [place], [place] [address]    Report Status 05/08/2018 FINAL  Final  Blood Culture (routine x 2)     Status: None   Collection Time: 05/03/18  3:57 PM  Result Value Ref Range Status   Specimen Description BLOOD RT  [facility]  Final   Special Requests   Final    BOTTLES DRAWN AEROBIC AND ANAEROBIC Blood Culture adequate volume   Culture   Final    NO GROWTH 5 DAYS Performed at [facility], [address], [place],  [address]    Report Status 05/08/2018 FINAL  Final  Body  fluid culture     Status: None   Collection Time: 05/03/18  4:23 PM  Result Value Ref Range Status   Specimen Description   Final    PERITONEAL CAVITY Performed at [facility], [address], [place], [place] [address]    Special Requests   Final    Normal Performed at [facility], [address] [place], [place] [address]    Gram Stain   Final    RARE WBC PRESENT, PREDOMINANTLY PMN NO ORGANISMS SEEN    Culture   Final    NO GROWTH 3 DAYS Performed at [facility], [place] [address], [place], [place] [address]    Report Status 05/07/2018 FINAL  Final    RADIOLOGY:  No results found.  EKG:   Orders placed or performed during the hospital encounter of 05/03/18  . ED EKG  . ED EKG  . EKG 12-Lead  . EKG 12-Lead  . ED EKG 12-Lead  . ED EKG 12-Lead  . EKG      Management plans discussed with the patient, family and they are in agreement.  CODE STATUS:     Code Status Orders  (From admission, onward)        Start     Ordered   05/03/18 1749  Full code  Continuous     05/03/18 1749    Code Status History    This patient has a current code status but no historical code status.      TOTAL TIME TAKING CARE OF THIS PATIENT: 43  minutes.   Note: This dictation was prepared with Dragon dictation along with smaller phrase technology. Any transcriptional errors that result from this process are unintentional.   @[facility] @  on 05/09/2018 at 11:07 AM  Between 7am to 6pm - Pager - [phone]  After 6pm go to [email or web address] - password [email or web address] [facility]  [facility]  Office  [phone]  CC: Primary care physician; [facility]

## 2018-05-25 ENCOUNTER — Ambulatory Visit: Admit: 2018-05-25 | Discharge: 2018-05-26 | Payer: MEDICARE | Attending: Family | Primary: Family

## 2018-05-25 DIAGNOSIS — I150 Renovascular hypertension: Principal | ICD-10-CM

## 2018-05-25 DIAGNOSIS — N186 End stage renal disease: Secondary | ICD-10-CM

## 2018-05-25 DIAGNOSIS — L299 Pruritus, unspecified: Secondary | ICD-10-CM

## 2018-06-08 ENCOUNTER — Encounter: Admit: 2018-06-08 | Discharge: 2018-06-08 | Payer: MEDICARE | Attending: Nephrology | Primary: Nephrology

## 2018-06-14 DIAGNOSIS — Z1211 Encounter for screening for malignant neoplasm of colon: Principal | ICD-10-CM

## 2018-06-15 ENCOUNTER — Encounter: Admit: 2018-06-15 | Discharge: 2018-06-15 | Payer: MEDICARE | Attending: Anesthesiology | Primary: Anesthesiology

## 2018-06-15 ENCOUNTER — Ambulatory Visit: Admit: 2018-06-15 | Discharge: 2018-06-15 | Payer: MEDICARE

## 2018-06-15 DIAGNOSIS — N186 End stage renal disease: Secondary | ICD-10-CM

## 2018-06-15 DIAGNOSIS — Z1211 Encounter for screening for malignant neoplasm of colon: Principal | ICD-10-CM

## 2018-06-15 DIAGNOSIS — Z01818 Encounter for other preprocedural examination: Secondary | ICD-10-CM

## 2018-06-15 DIAGNOSIS — Z7682 Awaiting organ transplant status: Principal | ICD-10-CM

## 2018-06-26 ENCOUNTER — Encounter
Admit: 2018-06-26 | Discharge: 2018-06-26 | Payer: MEDICARE | Attending: Transplant Surgery | Primary: Transplant Surgery

## 2018-06-26 DIAGNOSIS — Z7682 Awaiting organ transplant status: Principal | ICD-10-CM

## 2018-06-29 ENCOUNTER — Telehealth (INDEPENDENT_AMBULATORY_CARE_PROVIDER_SITE_OTHER): Payer: Self-pay

## 2018-06-29 NOTE — Telephone Encounter (Signed)
Attempted to contact the patient to schedule a permcath removal. Left a message for a return call.

## 2018-07-06 ENCOUNTER — Ambulatory Visit
Admission: RE | Admit: 2018-07-06 | Discharge: 2018-07-06 | Disposition: A | Discharge status: Home / Self Care | Payer: Medicare Other | Source: Ambulatory Visit | Attending: Nephrology | Admitting: Nephrology

## 2018-07-06 ENCOUNTER — Other Ambulatory Visit: Payer: Self-pay | Admitting: Nephrology

## 2018-07-06 ENCOUNTER — Other Ambulatory Visit
Admission: RE | Admit: 2018-07-06 | Discharge: 2018-07-06 | Disposition: A | Discharge status: Home / Self Care | Payer: Medicare Other | Source: Ambulatory Visit | Attending: Nephrology | Admitting: Nephrology

## 2018-07-06 DIAGNOSIS — N186 End stage renal disease: Secondary | ICD-10-CM

## 2018-07-06 DIAGNOSIS — Z9689 Presence of other specified functional implants: Secondary | ICD-10-CM | POA: Diagnosis not present

## 2018-07-06 DIAGNOSIS — R109 Unspecified abdominal pain: Secondary | ICD-10-CM | POA: Diagnosis present

## 2018-07-13 ENCOUNTER — Encounter: Admit: 2018-07-13 | Discharge: 2018-07-13 | Payer: MEDICARE | Attending: Nephrology | Primary: Nephrology

## 2018-07-16 ENCOUNTER — Other Ambulatory Visit (INDEPENDENT_AMBULATORY_CARE_PROVIDER_SITE_OTHER): Payer: Self-pay | Admitting: Nurse Practitioner

## 2018-07-18 ENCOUNTER — Encounter: Payer: Self-pay | Admitting: Vascular Surgery

## 2018-07-18 ENCOUNTER — Ambulatory Visit
Admission: RE | Admit: 2018-07-18 | Discharge: 2018-07-18 | Disposition: A | Discharge status: Home / Self Care | Payer: Medicare Other | Source: Ambulatory Visit | Attending: Vascular Surgery | Admitting: Vascular Surgery

## 2018-07-18 ENCOUNTER — Encounter
Admission: RE | Disposition: A | Discharge status: Home / Self Care | Payer: Self-pay | Source: Ambulatory Visit | Attending: Vascular Surgery

## 2018-07-18 DIAGNOSIS — I120 Hypertensive chronic kidney disease with stage 5 chronic kidney disease or end stage renal disease: Secondary | ICD-10-CM | POA: Diagnosis not present

## 2018-07-18 DIAGNOSIS — M109 Gout, unspecified: Secondary | ICD-10-CM | POA: Diagnosis not present

## 2018-07-18 DIAGNOSIS — Z87891 Personal history of nicotine dependence: Secondary | ICD-10-CM | POA: Insufficient documentation

## 2018-07-18 DIAGNOSIS — Z4901 Encounter for fitting and adjustment of extracorporeal dialysis catheter: Secondary | ICD-10-CM | POA: Insufficient documentation

## 2018-07-18 DIAGNOSIS — I10 Essential (primary) hypertension: Secondary | ICD-10-CM | POA: Diagnosis not present

## 2018-07-18 DIAGNOSIS — N186 End stage renal disease: Secondary | ICD-10-CM | POA: Diagnosis present

## 2018-07-18 DIAGNOSIS — T829XXA Unspecified complication of cardiac and vascular prosthetic device, implant and graft, initial encounter: Secondary | ICD-10-CM | POA: Diagnosis present

## 2018-07-18 DIAGNOSIS — Z8249 Family history of ischemic heart disease and other diseases of the circulatory system: Secondary | ICD-10-CM | POA: Diagnosis not present

## 2018-07-18 HISTORY — PX: DIALYSIS/PERMA CATHETER REMOVAL: CATH118289

## 2018-07-18 SURGERY — DIALYSIS/PERMA CATHETER REMOVAL
Anesthesia: LOCAL

## 2018-07-18 MED ORDER — LIDOCAINE-EPINEPHRINE (PF) 1 %-1:200000 IJ SOLN
INTRAMUSCULAR | Status: DC | PRN
  Administered 2018-07-18: 10 mL via INTRADERMAL

## 2018-07-18 SURGICAL SUPPLY — 2 items
FORCEPS HALSTEAD CVD 5IN STRL (INSTRUMENTS) ×2 IMPLANT
TRAY LACERAT/PLASTIC (MISCELLANEOUS) ×2 IMPLANT

## 2018-07-18 NOTE — Discharge Instructions (Signed)
Tunneled Catheter Removal, Care After °Refer to this sheet in the next few weeks. These instructions provide you with information about caring for yourself after your procedure. Your health care provider may also give you more specific instructions. Your treatment has been planned according to current medical practices, but problems sometimes occur. Call your health care provider if you have any problems or questions after your procedure. °What can I expect after the procedure? °After the procedure, it is common to have: °· Some mild redness, swelling, and pain around your catheter site. ° ° °Follow these instructions at home: °Incision care  °· Check your removal site  every day for signs of infection. Check for: °¨ More redness, swelling, or pain. °¨ More fluid or blood. °¨ Warmth. °¨ Pus or a bad smell. °· Follow instructions from your health care provider about how to take care of your removal site. Make sure you: °¨ Wash your hands with soap and water before you change your bandages (dressings). If soap and water are not available, use hand sanitizer. °Activity  °· Return to your normal activities as told by your health care provider. Ask your health care provider what activities are safe for you. °· Do not lift anything that is heavier than 10 lb (4.5 kg) for 3 weeks or as long as told by your health care provider. ° °Contact a health care provider if: °· You have more fluid or blood coming from your removal site °· You have more redness, swelling, or pain at your incisions or around the area where your catheter was removed °· Your removal site feel warm to the touch. °· You feel unusually weak. °· You feel nauseous.. °· Get help right away if °· You have swelling in your arm, shoulder, neck, or face. °· You develop chest pain. °· You have difficulty breathing. °· You feel dizzy or light-headed. °· You have pus or a bad smell coming from your removal site °· You have a fever. °· You develop bleeding from your  removal site, and your bleeding does not stop. °This information is not intended to replace advice given to you by your health care provider. Make sure you discuss any questions you have with your health care provider. °Document Released: 11/07/2012 Document Revised: 07/24/2016 Document Reviewed: 08/17/2015 °Elsevier Interactive Patient Education © 2017 [employer] ° °

## 2018-07-18 NOTE — Op Note (Signed)
  OPERATIVE NOTE   PROCEDURE: 1. Removal of a right IJ tunneled dialysis catheter  PRE-OPERATIVE DIAGNOSIS: Complication of dialysis catheter, End stage renal disease  POST-OPERATIVE DIAGNOSIS: Same  SURGEON: Hortencia Pilar, M.D.  ANESTHESIA: Local anesthetic with 1% lidocaine with epinephrine   ESTIMATED BLOOD LOSS: Minimal   FINDING(S): 1. Catheter intact   SPECIMEN(S):  Catheter  INDICATIONS:   Matthew Mathis is a 57 y.o. male who presents with functioning PD catheter and a nonfunctioning tunnel catheter.  The patient has undergone placement of an extremity access which is working and this has been successfully cannulated without difficulty.  therefore is undergoing removal of his tunneled catheter which is no longer needed to avoid septic complications.   DESCRIPTION: After obtaining full informed written consent, the patient was positioned supine. The right IJ catheter and surrounding area is prepped and draped in a sterile fashion. The cuff was localized by palpation and noted to be less than 3 cm from the exit site. After appropriate timeout is called, 1% lidocaine with epinephrine is infiltrated into the surrounding tissues around the cuff. Small transverse incision is created at the exit site with an 11 blade scalpel and the dissection was carried up along the catheter to expose the cuff of the tunneled catheter.  The catheter cuff is then freed from the surrounding attachments and adhesions. Once the catheter has been freed circumferentially it is removed in 1 piece. Light pressure was held at the base of the neck.   Antibiotic ointment and a sterile dressing is applied to the exit site. Patient tolerated procedure well and there were no complications.  COMPLICATIONS: None  CONDITION: Unchanged  Hortencia Pilar, M.D. [facility] Office: [phone]  07/18/2018,10:41 AM

## 2018-07-18 NOTE — H&P (Signed)
[facility] Admission History & Physical  MRN : [id number]  Matthew Mathis is a 57 y.o. (1961/01/30) male who presents with chief complaint of No chief complaint on file. [name]  History of Present Illness: I am asked to evaluate the patient by the dialysis center. The patient was sent here because they have a nonfunctioning tunneled catheter and a functioning PD catheter.  The patient reports they're not been any problems with any of their dialysis runs. They are reporting good flows with good parameters at dialysis.  Patient denies pain or tenderness overlying the access.  There is no pain with dialysis.  No fevers or chills while on dialysis.   No current facility-administered medications for this encounter.     Past Medical History:  Diagnosis Date  . Chronic renal failure    hx   . Gout    hx  . HTN (hypertension)     Past Surgical History:  Procedure Laterality Date  . CAPD INSERTION Right 05/24/2017   Procedure: LAPAROSCOPIC INSERTION CONTINUOUS AMBULATORY PERITONEAL DIALYSIS  (CAPD) CATHETER;  Surgeon: Katha Cabal, MD;  Location: [facility];  Service: Vascular;  Laterality: Right;  . DIALYSIS/PERMA CATHETER INSERTION N/A 05/07/2018   Procedure: DIALYSIS/PERMA CATHETER INSERTION;  Surgeon: Algernon Huxley, MD;  Location: [facility] [facility];  Service: Cardiovascular;  Laterality: N/A;  . DIALYSIS/PERMA CATHETER REMOVAL N/A 06/27/2017   Procedure: Dialysis/Perma Catheter Removal;  Surgeon: Katha Cabal, MD;  Location: [place] [facility];  Service: Cardiovascular;  Laterality: N/A;  . INSERTION OF DIALYSIS CATHETER Right 05/24/2017   Procedure: INSERTION OF DIALYSIS CATHETER ( TUNNELED CATH );  Surgeon: Katha Cabal, MD;  Location: [facility];  Service: Vascular;  Laterality: Right;  . PERITONEAL CATHETER INSERTION    . REMOVAL OF A DIALYSIS CATHETER Left 05/24/2017   Procedure: REMOVAL OF A DIALYSIS CATHETER ( PD CATH );  Surgeon: Katha Cabal, MD;  Location: [facility];  Service: Vascular;  Laterality: Left;  . RENAL BIOPSY     non specific     Social History Social History   Tobacco Use  . Smoking status: Former Smoker    Packs/day: 0.50    Types: Cigarettes    Last attempt to quit: 12/04/2010    Years since quitting: 7.6  . Smokeless tobacco: Never Used  . Tobacco comment: used to smoke 2-3 ppd, now smokes 3 cigarettes daily   Substance Use Topics  . Alcohol use: No  . Drug use: No    Family History Family History  Problem Relation Age of Onset  . Diabetes Father   . CAD Father   . Hypertension Mother        brother, sister   . Kidney disease Mother     No family history of bleeding or clotting disorders, autoimmune disease or porphyria  No Known Allergies   REVIEW OF SYSTEMS (Negative unless checked)  Constitutional: [] Weight loss  [] Fever  [] Chills Cardiac: [] Chest pain   [] Chest pressure   [] Palpitations   [] Shortness of breath when laying flat   [] Shortness of breath at rest   [x] Shortness of breath with exertion. Vascular:  [] Pain in legs with walking   [] Pain in legs at rest   [] Pain in legs when laying flat   [] Claudication   [] Pain in feet when walking  [] Pain in feet at rest  [] Pain in feet when laying flat   [] History of DVT   [] Phlebitis   [] Swelling in legs   []   Varicose veins   [] Non-healing ulcers Pulmonary:   [] Uses home oxygen   [] Productive cough   [] Hemoptysis   [] Wheeze  [] COPD   [] Asthma Neurologic:  [] Dizziness  [] Blackouts   [] Seizures   [] History of stroke   [] History of TIA  [] Aphasia   [] Temporary blindness   [] Dysphagia   [] Weakness or numbness in arms   [] Weakness or numbness in legs Musculoskeletal:  [] Arthritis   [] Joint swelling   [] Joint pain   [] Low back pain Hematologic:  [] Easy bruising  [] Easy bleeding   [] Hypercoagulable state   [] Anemic  [] Hepatitis Gastrointestinal:  [] Blood in stool   [] Vomiting blood  [] Gastroesophageal reflux/heartburn   [] Difficulty  swallowing. Genitourinary:  [x] Chronic kidney disease   [] Difficult urination  [] Frequent urination  [] Burning with urination   [] Blood in urine Skin:  [] Rashes   [] Ulcers   [] Wounds Psychological:  [] History of anxiety   []  History of major depression.  Physical Examination  Vitals:   07/18/18 0829  BP: (!) 154/79  Pulse: 82  Resp: 16  Temp: 98.9 F (37.2 C)  TempSrc: Oral  SpO2: 99%  Weight: 71 kg  Height: 5\' 2"  (1.575 m)   Body mass index is 28.63 kg/m. Gen: WD/WN, NAD Head: [place]/AT, No temporalis wasting. Prominent temp pulse not noted. Ear/Nose/Throat: Hearing grossly intact, nares w/o erythema or drainage, oropharynx w/o Erythema/Exudate,  Eyes: Conjunctiva clear, sclera non-icteric Neck: Trachea midline.  No JVD.  Pulmonary:  Good air movement, respirations not labored, no use of accessory muscles.  Cardiac: RRR, normal S1, S2. Vascular: PD catheter clean dry and intact Vessel Right Left  Radial Palpable Palpable  Ulnar Not Palpable Not Palpable  Brachial Palpable Palpable  Carotid Palpable, without bruit Palpable, without bruit  Gastrointestinal: soft, non-tender/non-distended. No guarding/reflex.  Musculoskeletal: M/S 5/5 throughout.  Extremities without ischemic changes.  No deformity or atrophy.  Neurologic: Sensation grossly intact in extremities.  Symmetrical.  Speech is fluent. Motor exam as listed above. Psychiatric: Judgment intact, Mood & affect appropriate for pt's clinical situation. Dermatologic: No rashes or ulcers noted.  No cellulitis or open wounds. Lymph : No Cervical, Axillary, or Inguinal lymphadenopathy.   CBC Lab Results  Component Value Date   WBC 8.0 05/09/2018   HGB 9.8 (L) 05/09/2018   HCT 29.3 (L) 05/09/2018   MCV 99.3 05/09/2018   PLT 192 05/09/2018    BMET    Component Value Date/Time   NA 135 05/07/2018 0510   NA 136 01/02/2013 1611   K 4.1 05/07/2018 0510   K 3.4 (L) 01/02/2013 1611   CL 93 (L) 05/07/2018 0510   CL 99  01/02/2013 1611   CO2 21 (L) 05/07/2018 0510   CO2 25 01/02/2013 1611   GLUCOSE 106 (H) 05/07/2018 0510   GLUCOSE 99 01/02/2013 1611   BUN 77 (H) 05/07/2018 0510   BUN 71 (H) 01/02/2013 1611   CREATININE 13.58 (H) 05/07/2018 0510   CREATININE 10.21 (H) 01/02/2013 1611   CALCIUM 8.1 (L) 05/07/2018 0510   CALCIUM 8.5 01/02/2013 1611   GFRNONAA 3 (L) 05/07/2018 0510   GFRNONAA 5 (L) 01/02/2013 1611   GFRAA 4 (L) 05/07/2018 0510   GFRAA 6 (L) 01/02/2013 1611   CrCl cannot be calculated (Patient's most recent lab result is older than the maximum 21 days allowed.).  COAG Lab Results  Component Value Date   INR 0.99 05/18/2017   INR 0.9 01/02/2013    Radiology Dg Abd 1 View  Result Date: 07/06/2018 CLINICAL DATA:  Evaluate  dialysis catheter placement. EXAM: ABDOMEN - 1 VIEW COMPARISON:  05/04/2018. FINDINGS: The bowel gas pattern is normal. No radio-opaque calculi or other significant radiographic abnormality are seen. Dialysis catheter is coiled in the pelvis, and appears uncomplicated. Vascular calcification is noted. Seminal vesicle calcification is noted. IMPRESSION: Dialysis catheter coiled in the pelvis.  Nonobstructive gas pattern. Electronically Signed   By: Staci Righter M.D.   On: 07/06/2018 15:23    Assessment/Plan 1.  Complication dialysis device with thrombosis AV access:  Patient's IJ tunneled catheter is malfunctioning. The patient has an extremity access that is functioning well. Therefore, the patient will undergo removal of the tunneled catheter under local anesthesia.  The risks and benefits were described to the patient.  All questions were answered.    2.  End-stage renal disease requiring hemodialysis:  Patient will continue dialysis therapy without further interruption if a successful intervention is not achieved then a tunneled catheter will be placed. Dialysis has already been arranged. 3.  Hypertension:  Patient will continue medical management; nephrology is  following no changes in oral medications.    Hortencia Pilar, MD  07/18/2018 10:03 AM

## 2018-07-19 DIAGNOSIS — N186 End stage renal disease: Secondary | ICD-10-CM

## 2018-07-19 DIAGNOSIS — Z7682 Awaiting organ transplant status: Principal | ICD-10-CM

## 2018-07-19 DIAGNOSIS — Z01818 Encounter for other preprocedural examination: Secondary | ICD-10-CM

## 2018-08-16 ENCOUNTER — Encounter: Admit: 2018-08-16 | Discharge: 2018-08-16 | Payer: MEDICARE | Attending: Nephrology | Primary: Nephrology

## 2018-08-16 DIAGNOSIS — Z01818 Encounter for other preprocedural examination: Principal | ICD-10-CM

## 2018-08-17 ENCOUNTER — Encounter: Admit: 2018-08-17 | Discharge: 2018-08-17 | Payer: MEDICARE | Attending: Nephrology | Primary: Nephrology

## 2018-08-24 ENCOUNTER — Ambulatory Visit: Admit: 2018-08-24 | Discharge: 2018-08-25 | Payer: MEDICARE | Attending: Family | Primary: Family

## 2018-08-24 DIAGNOSIS — L219 Seborrheic dermatitis, unspecified: Secondary | ICD-10-CM

## 2018-08-24 DIAGNOSIS — N186 End stage renal disease: Secondary | ICD-10-CM

## 2018-08-24 DIAGNOSIS — I150 Renovascular hypertension: Principal | ICD-10-CM

## 2018-08-24 DIAGNOSIS — Z01818 Encounter for other preprocedural examination: Secondary | ICD-10-CM

## 2018-08-24 DIAGNOSIS — Z7682 Awaiting organ transplant status: Principal | ICD-10-CM

## 2018-08-27 MED ORDER — KETOCONAZOLE 2 % SHAMPOO
TOPICAL | 5 refills | 0.00000 days | Status: CP
Start: 2018-08-27 — End: 2019-04-26

## 2018-09-05 ENCOUNTER — Encounter: Admit: 2018-09-05 | Discharge: 2018-09-05 | Payer: MEDICARE | Attending: Specialist | Primary: Specialist

## 2018-09-13 DIAGNOSIS — Z01818 Encounter for other preprocedural examination: Secondary | ICD-10-CM

## 2018-09-13 DIAGNOSIS — Z7682 Awaiting organ transplant status: Principal | ICD-10-CM

## 2018-09-13 DIAGNOSIS — N186 End stage renal disease: Secondary | ICD-10-CM

## 2018-10-05 ENCOUNTER — Other Ambulatory Visit (INDEPENDENT_AMBULATORY_CARE_PROVIDER_SITE_OTHER): Payer: Self-pay | Admitting: Vascular Surgery

## 2018-10-05 ENCOUNTER — Ambulatory Visit (INDEPENDENT_AMBULATORY_CARE_PROVIDER_SITE_OTHER): Payer: Medicare Other

## 2018-10-05 DIAGNOSIS — N186 End stage renal disease: Secondary | ICD-10-CM

## 2018-10-10 ENCOUNTER — Encounter: Admit: 2018-10-10 | Discharge: 2018-10-10 | Payer: MEDICARE | Attending: Nephrology | Primary: Nephrology

## 2018-10-14 ENCOUNTER — Encounter: Payer: Self-pay | Admitting: Emergency Medicine

## 2018-10-14 ENCOUNTER — Emergency Department: Payer: Medicare Other

## 2018-10-14 ENCOUNTER — Emergency Department
Admission: EM | Admit: 2018-10-14 | Discharge: 2018-10-14 | Disposition: A | Discharge status: Home / Self Care | Payer: Medicare Other | Source: Home / Self Care | Attending: Emergency Medicine | Admitting: Emergency Medicine

## 2018-10-14 DIAGNOSIS — T887XXA Unspecified adverse effect of drug or medicament, initial encounter: Secondary | ICD-10-CM

## 2018-10-14 DIAGNOSIS — Z87891 Personal history of nicotine dependence: Secondary | ICD-10-CM | POA: Insufficient documentation

## 2018-10-14 DIAGNOSIS — I120 Hypertensive chronic kidney disease with stage 5 chronic kidney disease or end stage renal disease: Secondary | ICD-10-CM | POA: Insufficient documentation

## 2018-10-14 DIAGNOSIS — Z79899 Other long term (current) drug therapy: Secondary | ICD-10-CM

## 2018-10-14 DIAGNOSIS — Z992 Dependence on renal dialysis: Secondary | ICD-10-CM

## 2018-10-14 DIAGNOSIS — N186 End stage renal disease: Secondary | ICD-10-CM

## 2018-10-14 DIAGNOSIS — F19921 Other psychoactive substance use, unspecified with intoxication with delirium: Secondary | ICD-10-CM

## 2018-10-14 DIAGNOSIS — G92 Toxic encephalopathy: Secondary | ICD-10-CM | POA: Diagnosis not present

## 2018-10-14 DIAGNOSIS — R4182 Altered mental status, unspecified: Secondary | ICD-10-CM | POA: Diagnosis not present

## 2018-10-14 DIAGNOSIS — R410 Disorientation, unspecified: Secondary | ICD-10-CM

## 2018-10-14 LAB — CBC WITH DIFFERENTIAL/PLATELET
Abs Immature Granulocytes: 0.06 10*3/uL (ref 0.00–0.07)
BASOS PCT: 0 %
Basophils Absolute: 0 10*3/uL (ref 0.0–0.1)
EOS ABS: 0.1 10*3/uL (ref 0.0–0.5)
Eosinophils Relative: 1 %
HCT: 35.6 % — ABNORMAL LOW (ref 39.0–52.0)
Hemoglobin: 11.8 g/dL — ABNORMAL LOW (ref 13.0–17.0)
Immature Granulocytes: 1 %
Lymphocytes Relative: 8 %
Lymphs Abs: 0.8 10*3/uL (ref 0.7–4.0)
MCH: 31.6 pg (ref 26.0–34.0)
MCHC: 33.1 g/dL (ref 30.0–36.0)
MCV: 95.2 fL (ref 80.0–100.0)
MONO ABS: 0.7 10*3/uL (ref 0.1–1.0)
MONOS PCT: 7 %
Neutro Abs: 8.2 10*3/uL — ABNORMAL HIGH (ref 1.7–7.7)
Neutrophils Relative %: 83 %
PLATELETS: 260 10*3/uL (ref 150–400)
RBC: 3.74 MIL/uL — ABNORMAL LOW (ref 4.22–5.81)
RDW: 15.4 % (ref 11.5–15.5)
WBC: 9.9 10*3/uL (ref 4.0–10.5)
nRBC: 0 % (ref 0.0–0.2)

## 2018-10-14 LAB — COMPREHENSIVE METABOLIC PANEL
ALT: 21 U/L (ref 0–44)
AST: 18 U/L (ref 15–41)
Albumin: 4.5 g/dL (ref 3.5–5.0)
Alkaline Phosphatase: 92 U/L (ref 38–126)
Anion gap: 17 — ABNORMAL HIGH (ref 5–15)
BUN: 46 mg/dL — AB (ref 6–20)
CO2: 23 mmol/L (ref 22–32)
CREATININE: 10.1 mg/dL — AB (ref 0.61–1.24)
Calcium: 9 mg/dL (ref 8.9–10.3)
Chloride: 90 mmol/L — ABNORMAL LOW (ref 98–111)
GFR calc Af Amer: 6 mL/min — ABNORMAL LOW (ref >60)
GFR, EST NON AFRICAN AMERICAN: 5 mL/min — AB (ref >60)
Glucose, Bld: 158 mg/dL — ABNORMAL HIGH (ref 70–99)
POTASSIUM: 4.1 mmol/L (ref 3.5–5.1)
Sodium: 130 mmol/L — ABNORMAL LOW (ref 135–145)
TOTAL PROTEIN: 7.2 g/dL (ref 6.5–8.1)
Total Bilirubin: 0.8 mg/dL (ref 0.3–1.2)

## 2018-10-14 LAB — ACETAMINOPHEN LEVEL: Acetaminophen (Tylenol), Serum: <10 ug/mL — ABNORMAL LOW (ref 10–30)

## 2018-10-14 LAB — AMMONIA: Ammonia: 21 umol/L (ref 9–35)

## 2018-10-14 LAB — ETHANOL

## 2018-10-14 LAB — SALICYLATE LEVEL: Salicylate Lvl: <7 mg/dL (ref 2.8–30.0)

## 2018-10-14 MED ORDER — SODIUM CHLORIDE 0.9 % IV BOLUS
500.0000 mL | Freq: Once | INTRAVENOUS | Status: AC
  Administered 2018-10-14: 500 mL via INTRAVENOUS

## 2018-10-14 NOTE — ED Notes (Signed)
Patient given water at this time.  

## 2018-10-14 NOTE — ED Provider Notes (Signed)
[facility] Emergency Department Provider Note  ____________________________________________  Time seen: Approximately 12:20 AM  I have reviewed the triage vital signs and the nursing notes.   HISTORY  Chief Complaint Altered Mental Status  Level 5 Caveat: Portions of the History and Physical including HPI and review of systems are unable to be completely obtained due to patient being a poor historian due to altered mental status   HPI Matthew Mathis is a 57 y.o. male with a history of end-stage renal disease on peritoneal dialysis, hypertension who comes to the ED by EMS with his wife who reports that he has had confusion, poor concentration throughout the day.  He started prednisone and valacyclovir yesterday on November 8 for shingles of the left chest wall, T4 distribution.   No changes to his other medication regimen.  No fever vomiting diarrhea or pain complaints.  Patient describes having visual hallucinations earlier today consisting of moving lights  Past Medical History:  Diagnosis Date  . Chronic renal failure    hx   . Gout    hx  . HTN (hypertension)      Patient Active Problem List   Diagnosis Date Noted  . Small bowel obstruction ([place]) 05/03/2018  . End stage renal disease ([place]) 05/08/2017  . Complication of renal dialysis device 05/08/2017  . HYPERTENSION, BENIGN 10/01/2010  . CHEST PAIN UNSPECIFIED 10/01/2010     Past Surgical History:  Procedure Laterality Date  . CAPD INSERTION Right 05/24/2017   Procedure: LAPAROSCOPIC INSERTION CONTINUOUS AMBULATORY PERITONEAL DIALYSIS  (CAPD) CATHETER;  Surgeon: Katha Cabal, MD;  Location: [facility];  Service: Vascular;  Laterality: Right;  . DIALYSIS/PERMA CATHETER INSERTION N/A 05/07/2018   Procedure: DIALYSIS/PERMA CATHETER INSERTION;  Surgeon: Algernon Huxley, MD;  Location: [facility] [facility];  Service: Cardiovascular;  Laterality: N/A;  . DIALYSIS/PERMA CATHETER REMOVAL N/A 06/27/2017    Procedure: Dialysis/Perma Catheter Removal;  Surgeon: Katha Cabal, MD;  Location: [place] [facility];  Service: Cardiovascular;  Laterality: N/A;  . DIALYSIS/PERMA CATHETER REMOVAL N/A 07/18/2018   Procedure: DIALYSIS/PERMA CATHETER REMOVAL;  Surgeon: Katha Cabal, MD;  Location: [place] [facility];  Service: Cardiovascular;  Laterality: N/A;  . INSERTION OF DIALYSIS CATHETER Right 05/24/2017   Procedure: INSERTION OF DIALYSIS CATHETER ( TUNNELED CATH );  Surgeon: Katha Cabal, MD;  Location: [facility];  Service: Vascular;  Laterality: Right;  . PERITONEAL CATHETER INSERTION    . REMOVAL OF A DIALYSIS CATHETER Left 05/24/2017   Procedure: REMOVAL OF A DIALYSIS CATHETER ( PD CATH );  Surgeon: Katha Cabal, MD;  Location: [facility];  Service: Vascular;  Laterality: Left;  . RENAL BIOPSY     non specific      Prior to Admission medications   Medication Sig Start Date End Date Taking? Authorizing Provider  allopurinol (ZYLOPRIM) 100 MG tablet Take 100 mg by mouth every evening.  07/25/16  Yes [provider]  benazepril (LOTENSIN) 20 MG tablet Take 20 mg by mouth daily. 07/02/18  Yes [provider]  calcium acetate (PHOSLO) 667 MG capsule Take 667-1,334 mg by mouth See admin instructions. Take 1334 mg by mouth three times a day and take 667 mg by mouth with snacks.   Yes [provider]  cinacalcet (SENSIPAR) 60 MG tablet Take 90 mg by mouth 2 (two) times daily with a meal.    Yes [provider]  metoprolol succinate (TOPROL-XL) 50 MG 24 hr tablet Take 1 tablet (50  mg total) by mouth daily. Take with or immediately following a meal. 05/10/18  Yes Gouru, Aruna, MD  Multiple Vitamins-Minerals (DIALYVITE 800/ULTRA D) TABS Take 1 tablet by mouth daily. 05/22/17  Yes [provider]  torsemide (DEMADEX) 100 MG tablet Take 100 mg by mouth daily. 09/07/18  Yes [provider]  triamcinolone cream (KENALOG) 0.1 % Apply 1 application  topically 2 (two) times daily as needed.    Yes [provider]  valACYclovir (VALTREX) 1000 MG tablet 500 mg 3 (three) times daily.  10/12/18  Yes [provider]  acetaminophen (TYLENOL) 325 MG tablet Take 2 tablets (650 mg total) by mouth every 6 (six) hours as needed for mild pain (or Fever >/= 101). Patient not taking: Reported on 07/17/2018 05/09/18   Nicholes Mango, MD  ketoconazole (NIZORAL) 2 % shampoo Apply 1 application topically 3 (three) times a week.  02/25/18   [provider]  sevelamer carbonate (RENVELA) 800 MG tablet Take 2 tablets (1,600 mg total) by mouth 3 (three) times daily with meals. Patient not taking: Reported on 07/17/2018 05/09/18   Nicholes Mango, MD     Allergies Patient has no known allergies.   Family History  Problem Relation Age of Onset  . Diabetes Father   . CAD Father   . Hypertension Mother        brother, sister   . Kidney disease Mother     Social History Social History   Tobacco Use  . Smoking status: Former Smoker    Packs/day: 0.50    Types: Cigarettes    Last attempt to quit: 12/04/2010    Years since quitting: 7.8  . Smokeless tobacco: Never Used  . Tobacco comment: used to smoke 2-3 ppd, now smokes 3 cigarettes daily   Substance Use Topics  . Alcohol use: No  . Drug use: No    Review of Systems  Constitutional:   No fever or chills.  ENT:   No sore throat. No rhinorrhea. Cardiovascular:   No chest pain or syncope. Respiratory:   No dyspnea or cough. Gastrointestinal:   Negative for abdominal pain, vomiting and diarrhea.  Musculoskeletal:   Negative for focal pain or swelling All other systems reviewed and are negative except as documented above in ROS and HPI.  ____________________________________________   PHYSICAL EXAM:  VITAL SIGNS: ED Triage Vitals  Enc Vitals Group     BP 10/14/18 0007 (!) 163/102     Pulse Rate 10/14/18 0007 75     Resp 10/14/18 0007 18     Temp 10/14/18 0007 98.3 F (36.8  C)     Temp Source 10/14/18 0007 Oral     SpO2 10/14/18 0007 100 %     Weight 10/14/18 0009 162 lb (73.5 kg)     Height 10/14/18 0009 5\' 6"  (1.676 m)     Head Circumference --      Peak Flow --      Pain Score 10/14/18 0009 0     Pain Loc --      Pain Edu? --      Excl. in [place]? --     Vital signs reviewed, nursing assessments reviewed.   Constitutional:   Alert and oriented to person and place. Non-toxic appearance. Eyes:   Conjunctivae are normal. EOMI. PERRL. ENT      Head:   Normocephalic and atraumatic.      Nose:   No congestion/rhinnorhea.       Mouth/Throat:   MMM, no  pharyngeal erythema. No peritonsillar mass.       Neck:   No meningismus. Full ROM. Hematological/Lymphatic/Immunilogical:   No cervical lymphadenopathy. Cardiovascular:   RRR. Symmetric bilateral radial and DP pulses.  No murmurs. Cap refill less than 2 seconds. Respiratory:   Normal respiratory effort without tachypnea/retractions. Breath sounds are clear and equal bilaterally. No wheezes/rales/rhonchi. Gastrointestinal:   Soft and nontender. Non distended. There is no CVA tenderness.  No rebound, rigidity, or guarding.  PD catheter site clean and noninflamed  Musculoskeletal:   Normal range of motion in all extremities. No joint effusions.  No lower extremity tenderness.  No edema. Neurologic:   Normal speech and language.  Motor grossly intact.  Able to turn himself, moved from stretcher to bed, and otherwise reposition and move his body without difficulty Cranial nerves III through XII intact Cerebellar function intact No acute focal neurologic deficits are appreciated.  Skin:    Skin is warm, dry and intact.  Shingles rash present over left anterior chest in a large crop.  Not weeping.  No petechiae, purpura, or bullae.  ____________________________________________    LABS (pertinent positives/negatives) (all labs ordered are listed, but only abnormal results are displayed) Labs Reviewed   COMPREHENSIVE METABOLIC PANEL - Abnormal; Notable for the following components:      Result Value   Sodium 130 (*)    Chloride 90 (*)    Glucose, Bld 158 (*)    BUN 46 (*)    Creatinine, Ser 10.10 (*)    GFR calc non Af Amer 5 (*)    GFR calc Af Amer 6 (*)    Anion gap 17 (*)    All other components within normal limits  CBC WITH DIFFERENTIAL/PLATELET - Abnormal; Notable for the following components:   RBC 3.74 (*)    Hemoglobin 11.8 (*)    HCT 35.6 (*)    Neutro Abs 8.2 (*)    All other components within normal limits  ACETAMINOPHEN LEVEL - Abnormal; Notable for the following components:   Acetaminophen (Tylenol), Serum <10 (*)    All other components within normal limits  ETHANOL  SALICYLATE LEVEL  AMMONIA   ____________________________________________   EKG  Interpreted by me Sinus rhythm rate of 74, normal axis intervals QRS ST segments and T waves.  ____________________________________________    RADIOLOGY  Ct Head Wo Contrast  Result Date: 10/14/2018 CLINICAL DATA:  Mental status changes. EXAM: CT HEAD WITHOUT CONTRAST TECHNIQUE: Contiguous axial images were obtained from the base of the skull through the vertex without intravenous contrast. COMPARISON:  Head CT 07/17/2010 FINDINGS: Brain: No evidence of acute infarction, hemorrhage, hydrocephalus, extra-axial collection or mass lesion/mass effect. Mild age advanced cerebral atrophy and slight ventriculomegaly. The brainstem and cerebellum appear grossly normal. Vascular: Age advanced atherosclerotic calcifications. No obvious aneurysm or hyperdense vessels. Skull: No skull fracture or bone lesions. Sinuses/Orbits: The paranasal sinuses and mastoid air cells are clear. Other: No scalp lesions or hematoma IMPRESSION: 1. No acute intracranial findings or mass lesion. 2. Mild age advanced cerebral atrophy. Electronically Signed   By: Marijo Sanes M.D.   On: 10/14/2018 00:58   Dg Abdomen Acute W/chest  Result Date:  10/14/2018 CLINICAL DATA:  Acute onset of altered mental status. High blood pressure. Recently diagnosed with shingles. EXAM: DG ABDOMEN ACUTE W/ 1V CHEST COMPARISON:  Abdominal radiograph performed 07/06/2018, and chest radiograph performed 07/17/2010 FINDINGS: The lungs are well-aerated and clear. There is no evidence of focal opacification, pleural effusion or pneumothorax. The cardiomediastinal  silhouette is within normal limits. The visualized bowel gas pattern is unremarkable. Scattered stool and air are seen within the colon; there is no evidence of small bowel dilatation to suggest obstruction. No free intra-abdominal air is identified on the provided upright view. No acute osseous abnormalities are seen; the sacroiliac joints are unremarkable in appearance. The peritoneal dialysis catheter is grossly unremarkable in appearance. Scattered vascular calcifications are seen within the pelvis. IMPRESSION: 1. Unremarkable bowel gas pattern; no free intra-abdominal air seen. Small amount of stool noted in the colon. 2. No acute cardiopulmonary process seen. Electronically Signed   By: Garald Balding M.D.   On: 10/14/2018 01:07    ____________________________________________   PROCEDURES Procedures  ____________________________________________  DIFFERENTIAL DIAGNOSIS   Stroke, intracranial hemorrhage, pneumonia, bowel obstruction, electrolyte disturbance, prednisone side effect.  Doubt encephalitis or meningitis or sepsis.  CLINICAL IMPRESSION / ASSESSMENT AND PLAN / ED COURSE  Pertinent labs & imaging results that were available during my care of the patient were reviewed by me and considered in my medical decision making (see chart for details).    Patient presents with altered mental status.  He is conversant and has good memory about his recent medication regimen, states that he feels fine and denies any acute complaints.  The wife feels that he is confused.  He had some episodic high blood  pressures as high as 469 systolic earlier today currently 160/100.  Other vital signs are unremarkable.  Most likely this is a case of mild delirium induced by prednisone.  I do not think the patient has meningitis or encephalitis or any other acute infectious process.  Due to his comorbidities will obtain a CT scan of the head, abdominal x-ray series including chest, labs.  Clinical Course as of Oct 15 307  Sun Oct 14, 2018  0304 Remains awake and alert, neurologically intact on my exam with good memory recall.  Work-up is completely negative, vital signs are okay.  Mildly elevated anion gap on his chemistry which I think is due to combination of dehydration and his end-stage renal disease, dialysis dependent.  Doubt sepsis or shock or lactic acidosis or toxidrome.  Stable for discharge home to continue peritoneal dialysis.  I expect that his symptoms will resolve in the next 12 to 24 hours now that we are discontinuing the prednisone.  Patient agrees with this plan, wife is comfortable with outpatient follow-up at this time to   [PS]    Clinical Course User Index [PS] Carrie Mew, MD     ____________________________________________   FINAL CLINICAL IMPRESSION(S) / ED DIAGNOSES    Final diagnoses:  Delirium  Side effect of medication     ED Discharge Orders    None      Portions of this note were generated with dragon dictation software. Dictation errors may occur despite best attempts at proofreading.    Carrie Mew, MD 10/14/18 [phone]

## 2018-10-14 NOTE — ED Triage Notes (Signed)
Patient brought in by ems from home. Per ems they were called out for a sick visit. Patient was recently diagnosed with shingles and started on new medication. Per wife patient has not been acting like himself. Per ems bp 168/100 and fsbs 201.

## 2018-10-15 ENCOUNTER — Other Ambulatory Visit: Payer: Self-pay

## 2018-10-15 ENCOUNTER — Inpatient Hospital Stay
Admission: EM | Admit: 2018-10-15 | Discharge: 2018-10-20 | DRG: 091 | Disposition: A | Discharge status: Home Health | Payer: Medicare Other | Attending: Internal Medicine | Admitting: Internal Medicine

## 2018-10-15 DIAGNOSIS — E878 Other disorders of electrolyte and fluid balance, not elsewhere classified: Secondary | ICD-10-CM | POA: Diagnosis present

## 2018-10-15 DIAGNOSIS — R4182 Altered mental status, unspecified: Secondary | ICD-10-CM | POA: Diagnosis present

## 2018-10-15 DIAGNOSIS — B029 Zoster without complications: Secondary | ICD-10-CM | POA: Diagnosis present

## 2018-10-15 DIAGNOSIS — Z841 Family history of disorders of kidney and ureter: Secondary | ICD-10-CM

## 2018-10-15 DIAGNOSIS — Z833 Family history of diabetes mellitus: Secondary | ICD-10-CM | POA: Diagnosis not present

## 2018-10-15 DIAGNOSIS — I120 Hypertensive chronic kidney disease with stage 5 chronic kidney disease or end stage renal disease: Secondary | ICD-10-CM | POA: Diagnosis present

## 2018-10-15 DIAGNOSIS — G92 Toxic encephalopathy: Principal | ICD-10-CM | POA: Diagnosis present

## 2018-10-15 DIAGNOSIS — Z1389 Encounter for screening for other disorder: Secondary | ICD-10-CM

## 2018-10-15 DIAGNOSIS — E871 Hypo-osmolality and hyponatremia: Secondary | ICD-10-CM | POA: Diagnosis present

## 2018-10-15 DIAGNOSIS — Z79899 Other long term (current) drug therapy: Secondary | ICD-10-CM

## 2018-10-15 DIAGNOSIS — Z8249 Family history of ischemic heart disease and other diseases of the circulatory system: Secondary | ICD-10-CM | POA: Diagnosis not present

## 2018-10-15 DIAGNOSIS — N186 End stage renal disease: Secondary | ICD-10-CM | POA: Diagnosis present

## 2018-10-15 DIAGNOSIS — T375X5A Adverse effect of antiviral drugs, initial encounter: Secondary | ICD-10-CM | POA: Diagnosis present

## 2018-10-15 DIAGNOSIS — Z992 Dependence on renal dialysis: Secondary | ICD-10-CM | POA: Diagnosis not present

## 2018-10-15 DIAGNOSIS — R451 Restlessness and agitation: Secondary | ICD-10-CM | POA: Diagnosis not present

## 2018-10-15 DIAGNOSIS — D631 Anemia in chronic kidney disease: Secondary | ICD-10-CM | POA: Diagnosis present

## 2018-10-15 DIAGNOSIS — F17210 Nicotine dependence, cigarettes, uncomplicated: Secondary | ICD-10-CM | POA: Diagnosis present

## 2018-10-15 DIAGNOSIS — G9340 Encephalopathy, unspecified: Secondary | ICD-10-CM | POA: Diagnosis present

## 2018-10-15 DIAGNOSIS — N2581 Secondary hyperparathyroidism of renal origin: Secondary | ICD-10-CM | POA: Diagnosis present

## 2018-10-15 DIAGNOSIS — M109 Gout, unspecified: Secondary | ICD-10-CM | POA: Diagnosis present

## 2018-10-15 DIAGNOSIS — Z781 Physical restraint status: Secondary | ICD-10-CM

## 2018-10-15 LAB — COMPREHENSIVE METABOLIC PANEL
ALBUMIN: 4.2 g/dL (ref 3.5–5.0)
ALK PHOS: 85 U/L (ref 38–126)
ALT: 18 U/L (ref 0–44)
ANION GAP: 16 — AB (ref 5–15)
AST: 17 U/L (ref 15–41)
BILIRUBIN TOTAL: 0.8 mg/dL (ref 0.3–1.2)
BUN: 51 mg/dL — AB (ref 6–20)
CALCIUM: 8 mg/dL — AB (ref 8.9–10.3)
CO2: 26 mmol/L (ref 22–32)
Chloride: 86 mmol/L — ABNORMAL LOW (ref 98–111)
Creatinine, Ser: 10.53 mg/dL — ABNORMAL HIGH (ref 0.61–1.24)
GFR calc Af Amer: 5 mL/min — ABNORMAL LOW (ref >60)
GFR calc non Af Amer: 5 mL/min — ABNORMAL LOW (ref >60)
Glucose, Bld: 107 mg/dL — ABNORMAL HIGH (ref 70–99)
Potassium: 4.1 mmol/L (ref 3.5–5.1)
Sodium: 128 mmol/L — ABNORMAL LOW (ref 135–145)
TOTAL PROTEIN: 6.9 g/dL (ref 6.5–8.1)

## 2018-10-15 LAB — URINALYSIS, ROUTINE W REFLEX MICROSCOPIC
Bacteria, UA: NONE SEEN
Bilirubin Urine: NEGATIVE
GLUCOSE, UA: 150 mg/dL — AB
Ketones, ur: NEGATIVE mg/dL
Leukocytes, UA: NEGATIVE
Nitrite: NEGATIVE
Protein, ur: 30 mg/dL — AB
SQUAMOUS EPITHELIAL / LPF: NONE SEEN (ref 0–5)
Specific Gravity, Urine: 1.009 (ref 1.005–1.030)
pH: 8 (ref 5.0–8.0)

## 2018-10-15 LAB — CBC
HEMATOCRIT: 34.9 % — AB (ref 39.0–52.0)
HEMOGLOBIN: 11.9 g/dL — AB (ref 13.0–17.0)
MCH: 32.2 pg (ref 26.0–34.0)
MCHC: 34.1 g/dL (ref 30.0–36.0)
MCV: 94.6 fL (ref 80.0–100.0)
Platelets: 252 10*3/uL (ref 150–400)
RBC: 3.69 MIL/uL — ABNORMAL LOW (ref 4.22–5.81)
RDW: 15.2 % (ref 11.5–15.5)
WBC: 8.8 10*3/uL (ref 4.0–10.5)
nRBC: 0 % (ref 0.0–0.2)

## 2018-10-15 LAB — AMMONIA: Ammonia: 14 umol/L (ref 9–35)

## 2018-10-15 LAB — ETHANOL: Alcohol, Ethyl (B): <10 mg/dL (ref <10)

## 2018-10-15 LAB — TSH: TSH: 0.156 u[IU]/mL — ABNORMAL LOW (ref 0.350–4.500)

## 2018-10-15 MED ORDER — HYDROCODONE-ACETAMINOPHEN 5-325 MG PO TABS: 1.0000 | ORAL_TABLET | ORAL | Status: DC | PRN

## 2018-10-15 MED ORDER — ONDANSETRON HCL 4 MG PO TABS: 4.0000 mg | ORAL_TABLET | Freq: Four times a day (QID) | ORAL | Status: DC | PRN

## 2018-10-15 MED ORDER — BENAZEPRIL HCL 20 MG PO TABS
20.0000 mg | ORAL_TABLET | Freq: Every day | ORAL | Status: DC
  Administered 2018-10-15 – 2018-10-16 (×2): 20 mg via ORAL
  Filled 2018-10-15 (×4): qty 1

## 2018-10-15 MED ORDER — POLYETHYLENE GLYCOL 3350 17 G PO PACK: 17.0000 g | PACK | Freq: Every day | ORAL | Status: DC | PRN

## 2018-10-15 MED ORDER — METOPROLOL SUCCINATE ER 50 MG PO TB24
50.0000 mg | ORAL_TABLET | Freq: Every day | ORAL | Status: DC
  Administered 2018-10-15 – 2018-10-16 (×2): 50 mg via ORAL
  Filled 2018-10-15 (×3): qty 1

## 2018-10-15 MED ORDER — ACETAMINOPHEN 650 MG RE SUPP: 650.0000 mg | Freq: Four times a day (QID) | RECTAL | Status: DC | PRN

## 2018-10-15 MED ORDER — GENTAMICIN SULFATE 0.1 % EX CREA
1.0000 "application " | TOPICAL_CREAM | Freq: Every day | CUTANEOUS | Status: DC
  Administered 2018-10-15 – 2018-10-20 (×6): 1 via TOPICAL
  Filled 2018-10-15 (×2): qty 15

## 2018-10-15 MED ORDER — TRAZODONE HCL 50 MG PO TABS
50.0000 mg | ORAL_TABLET | Freq: Every evening | ORAL | Status: DC | PRN
  Filled 2018-10-15: qty 1

## 2018-10-15 MED ORDER — SODIUM CHLORIDE 0.9% FLUSH: 3.0000 mL | INTRAVENOUS | Status: DC | PRN

## 2018-10-15 MED ORDER — CINACALCET HCL 30 MG PO TABS
90.0000 mg | ORAL_TABLET | Freq: Two times a day (BID) | ORAL | Status: DC
  Administered 2018-10-16: 90 mg via ORAL
  Filled 2018-10-15 (×4): qty 3

## 2018-10-15 MED ORDER — SODIUM CHLORIDE 0.9% FLUSH
3.0000 mL | Freq: Two times a day (BID) | INTRAVENOUS | Status: DC
  Administered 2018-10-15 – 2018-10-19 (×7): 3 mL via INTRAVENOUS

## 2018-10-15 MED ORDER — SEVELAMER CARBONATE 800 MG PO TABS: 1600.0000 mg | ORAL_TABLET | Freq: Three times a day (TID) | ORAL | Status: DC

## 2018-10-15 MED ORDER — SODIUM CHLORIDE 1 G PO TABS
2.0000 g | ORAL_TABLET | Freq: Three times a day (TID) | ORAL | Status: AC
  Administered 2018-10-15 – 2018-10-16 (×3): 2 g via ORAL
  Filled 2018-10-15 (×4): qty 2

## 2018-10-15 MED ORDER — HEPARIN SODIUM (PORCINE) 5000 UNIT/ML IJ SOLN
5000.0000 [IU] | Freq: Three times a day (TID) | INTRAMUSCULAR | Status: DC
  Administered 2018-10-15 – 2018-10-20 (×13): 5000 [IU] via SUBCUTANEOUS
  Filled 2018-10-15 (×13): qty 1

## 2018-10-15 MED ORDER — CALCIUM ACETATE (PHOS BINDER) 667 MG PO CAPS
1334.0000 mg | ORAL_CAPSULE | Freq: Three times a day (TID) | ORAL | Status: DC
  Administered 2018-10-16 (×2): 1334 mg via ORAL
  Filled 2018-10-15 (×3): qty 2

## 2018-10-15 MED ORDER — TORSEMIDE 20 MG PO TABS
100.0000 mg | ORAL_TABLET | Freq: Every day | ORAL | Status: DC
  Administered 2018-10-15 – 2018-10-16 (×2): 100 mg via ORAL
  Filled 2018-10-15 (×3): qty 5

## 2018-10-15 MED ORDER — ACETAMINOPHEN 325 MG PO TABS: 650.0000 mg | ORAL_TABLET | Freq: Four times a day (QID) | ORAL | Status: DC | PRN

## 2018-10-15 MED ORDER — ONDANSETRON HCL 4 MG/2ML IJ SOLN: 4.0000 mg | Freq: Four times a day (QID) | INTRAMUSCULAR | Status: DC | PRN

## 2018-10-15 MED ORDER — ADULT MULTIVITAMIN W/MINERALS CH
ORAL_TABLET | Freq: Every day | ORAL | Status: DC
  Administered 2018-10-16: 1 via ORAL
  Filled 2018-10-15 (×2): qty 1

## 2018-10-15 MED ORDER — HYDRALAZINE HCL 20 MG/ML IJ SOLN: 10.0000 mg | INTRAMUSCULAR | Status: DC | PRN

## 2018-10-15 MED ORDER — TRIAMCINOLONE ACETONIDE 0.1 % EX CREA
1.0000 "application " | TOPICAL_CREAM | Freq: Two times a day (BID) | CUTANEOUS | Status: DC | PRN
  Filled 2018-10-15: qty 15

## 2018-10-15 MED ORDER — SODIUM CHLORIDE 0.9 % IV SOLN: 250.0000 mL | INTRAVENOUS | Status: DC | PRN

## 2018-10-15 MED ORDER — DELFLEX-LC/1.5% DEXTROSE 344 MOSM/L IP SOLN
INTRAPERITONEAL | Status: DC
  Administered 2018-10-15: 6000 mL via INTRAPERITONEAL
  Administered 2018-10-16: 8000 mL via INTRAPERITONEAL
  Administered 2018-10-18: 17:00:00 via INTRAPERITONEAL
  Filled 2018-10-15 (×5): qty 3000

## 2018-10-15 NOTE — Progress Notes (Signed)
   10/15/18 2117  Cycler Setup  Total Number of Exchanges 4  Fill Volume 2000  Dianeal Solution Dextrose 1.5% in 6000 mL  Last Fill Volume 0  Fill Time - Minute(s) 10  Dwell Time - Hour(s) 1  Dwell Time - Minute(s) 34  Drain Time - Minute(s) 20 mins  Fluid Balance - CCPD  Total Intake for Exchanges (mL) 8000 ml  Hand-Off documentation  Report given to (Full Name) Beatris Ship, RN   Report received from (Full Name) Verdie Drown, RN

## 2018-10-15 NOTE — ED Notes (Addendum)
Dr. Kerman Passey discussed Admission with pt via online interpretor [place]

## 2018-10-15 NOTE — H&P (Signed)
[facility] NAME: Matthew Mathis    MR#:  [id number]  DATE OF BIRTH:  April 02, 1961  DATE OF ADMISSION:  10/15/2018  PRIMARY CARE PHYSICIAN: [facility]   REQUESTING/REFERRING PHYSICIAN:   CHIEF COMPLAINT:  AMS  HISTORY OF PRESENT ILLNESS: Matthew Mathis  is a 57 y.o. male with a known history per below, recently diagnosed with shingles-started on prednisone/valacyclovir Friday, developed odd behavior, confusion, problems with speech the following day, patient brought to the emergency room for further evaluation that included CT of the head which was negative-prednisone was discontinued/patient sent home on continued valacyclovir, patient continues to have no improvement in symptomatology and subsequently brought back to the emergency room today with same complaints, ER work-up noted for sodium 128, chloride 86, creatinine 10, BUN 51, patient is now being admitted for acute encephalopathy most likely due to medication side effect from prednisone and Valtrex.  PAST MEDICAL HISTORY:   Past Medical History:  Diagnosis Date  . Chronic renal failure    hx   . Gout    hx  . HTN (hypertension)     PAST SURGICAL HISTORY:  Past Surgical History:  Procedure Laterality Date  . CAPD INSERTION Right 05/24/2017   Procedure: LAPAROSCOPIC INSERTION CONTINUOUS AMBULATORY PERITONEAL DIALYSIS  (CAPD) CATHETER;  Surgeon: Katha Cabal, MD;  Location: [facility];  Service: Vascular;  Laterality: Right;  . DIALYSIS/PERMA CATHETER INSERTION N/A 05/07/2018   Procedure: DIALYSIS/PERMA CATHETER INSERTION;  Surgeon: Algernon Huxley, MD;  Location: [facility];  Service: Cardiovascular;  Laterality: N/A;  . DIALYSIS/PERMA CATHETER REMOVAL N/A 06/27/2017   Procedure: Dialysis/Perma Catheter Removal;  Surgeon: Katha Cabal, MD;  Location: [facility];  Service: Cardiovascular;  Laterality: N/A;  . DIALYSIS/PERMA CATHETER REMOVAL N/A  07/18/2018   Procedure: DIALYSIS/PERMA CATHETER REMOVAL;  Surgeon: Katha Cabal, MD;  Location: [facility];  Service: Cardiovascular;  Laterality: N/A;  . INSERTION OF DIALYSIS CATHETER Right 05/24/2017   Procedure: INSERTION OF DIALYSIS CATHETER ( TUNNELED CATH );  Surgeon: Katha Cabal, MD;  Location: [facility];  Service: Vascular;  Laterality: Right;  . PERITONEAL CATHETER INSERTION    . REMOVAL OF A DIALYSIS CATHETER Left 05/24/2017   Procedure: REMOVAL OF A DIALYSIS CATHETER ( PD CATH );  Surgeon: Katha Cabal, MD;  Location: [facility];  Service: Vascular;  Laterality: Left;  . RENAL BIOPSY     non specific     SOCIAL HISTORY:  Social History   Tobacco Use  . Smoking status: Former Smoker    Packs/day: 0.50    Types: Cigarettes    Last attempt to quit: 12/04/2010    Years since quitting: 7.8  . Smokeless tobacco: Never Used  . Tobacco comment: used to smoke 2-3 ppd, now smokes 3 cigarettes daily   Substance Use Topics  . Alcohol use: No    FAMILY HISTORY:  Family History  Problem Relation Age of Onset  . Diabetes Father   . CAD Father   . Hypertension Mother        brother, sister   . Kidney disease Mother     DRUG ALLERGIES: No Known Allergies  REVIEW OF SYSTEMS:   CONSTITUTIONAL: No fever, fatigue or weakness.  EYES: No blurred or double vision.  EARS, NOSE, AND THROAT: No tinnitus or ear pain.  RESPIRATORY: No cough, shortness of breath, wheezing or hemoptysis.  CARDIOVASCULAR: No chest pain, orthopnea, edema.  GASTROINTESTINAL:  No nausea, vomiting, diarrhea or abdominal pain.  GENITOURINARY: No dysuria, hematuria.  ENDOCRINE: No polyuria, nocturia,  HEMATOLOGY: No anemia, easy bruising or bleeding SKIN: No rash or lesion. MUSCULOSKELETAL: No joint pain or arthritis.   NEUROLOGIC: Confusion, disorientation, problems with speech  PSYCHIATRY: No anxiety or depression.   MEDICATIONS AT HOME:  Prior to Admission medications    Medication Sig Start Date End Date Taking? Authorizing Provider  allopurinol (ZYLOPRIM) 100 MG tablet Take 100 mg by mouth every evening.  07/25/16   [provider]  benazepril (LOTENSIN) 20 MG tablet Take 20 mg by mouth daily. 07/02/18   [provider]  calcium acetate (PHOSLO) 667 MG capsule Take 667-1,334 mg by mouth See admin instructions. Take 1334 mg by mouth three times a day and take 667 mg by mouth with snacks.    [provider]  cinacalcet (SENSIPAR) 60 MG tablet Take 90 mg by mouth 2 (two) times daily with a meal.     [provider]  ketoconazole (NIZORAL) 2 % shampoo Apply 1 application topically 3 (three) times a week.  02/25/18   [provider]  metoprolol succinate (TOPROL-XL) 50 MG 24 hr tablet Take 1 tablet (50 mg total) by mouth daily. Take with or immediately following a meal. 05/10/18   Gouru, Aruna, MD  Multiple Vitamins-Minerals (DIALYVITE 800/ULTRA D) TABS Take 1 tablet by mouth daily. 05/22/17   [provider]  sevelamer carbonate (RENVELA) 800 MG tablet Take 2 tablets (1,600 mg total) by mouth 3 (three) times daily with meals. Patient not taking: Reported on 07/17/2018 05/09/18   Nicholes Mango, MD  torsemide (DEMADEX) 100 MG tablet Take 100 mg by mouth daily. 09/07/18   [provider]  triamcinolone cream (KENALOG) 0.1 % Apply 1 application topically 2 (two) times daily as needed.     [provider]  valACYclovir (VALTREX) 1000 MG tablet 500 mg 3 (three) times daily.  10/12/18   [provider]      PHYSICAL EXAMINATION:   VITAL SIGNS: Blood pressure (!) 144/91, pulse 73, temperature 98 F (36.7 C), temperature source Oral, resp. rate 18, height 5\' 7"  (1.702 m), weight 73 kg, SpO2 98 %.  GENERAL:  57 y.o.-year-old patient lying in the bed with no acute distress.  EYES: Pupils equal, round, reactive to light and accommodation. No scleral icterus. Extraocular muscles intact.  HEENT: Head  atraumatic, normocephalic. Oropharynx and nasopharynx clear.  NECK:  Supple, no jugular venous distention. No thyroid enlargement, no tenderness.  LUNGS: Normal breath sounds bilaterally, no wheezing, rales,rhonchi or crepitation. No use of accessory muscles of respiration.  CARDIOVASCULAR: S1, S2 normal. No murmurs, rubs, or gallops.  ABDOMEN: Soft, nontender, nondistended. Bowel sounds present. No organomegaly or mass.  EXTREMITIES: No pedal edema, cyanosis, or clubbing.  NEUROLOGIC: Cranial nerves II through XII are intact. Muscle strength 5/5 in all extremities. Sensation intact. Gait not checked.  PSYCHIATRIC: The patient is alert and oriented x 2-3, intermittent confusion  sKIN: No obvious rash, lesion, or ulcer.   LABORATORY PANEL:   CBC Recent Labs  Lab 10/14/18 0013 10/15/18 1608  WBC 9.9 8.8  HGB 11.8* 11.9*  HCT 35.6* 34.9*  PLT 260 252  MCV 95.2 94.6  MCH 31.6 32.2  MCHC 33.1 34.1  RDW 15.4 15.2  LYMPHSABS 0.8  --   MONOABS 0.7  --   EOSABS 0.1  --   BASOSABS 0.0  --    ------------------------------------------------------------------------------------------------------------------  Chemistries  Recent Labs  Lab 10/14/18  0013 10/15/18 1608  NA 130* 128*  K 4.1 4.1  CL 90* 86*  CO2 23 26  GLUCOSE 158* 107*  BUN 46* 51*  CREATININE 10.10* 10.53*  CALCIUM 9.0 8.0*  AST 18 17  ALT 21 18  ALKPHOS 92 85  BILITOT 0.8 0.8   ------------------------------------------------------------------------------------------------------------------ estimated creatinine clearance is 7.1 mL/min (A) (by C-G formula based on SCr of 10.53 mg/dL (H)). ------------------------------------------------------------------------------------------------------------------ No results for input(s): TSH, T4TOTAL, T3FREE, THYROIDAB in the last 72 hours.  Invalid input(s): FREET3   Coagulation profile No results for input(s): INR, PROTIME in the last 168  hours. ------------------------------------------------------------------------------------------------------------------- No results for input(s): DDIMER in the last 72 hours. -------------------------------------------------------------------------------------------------------------------  Cardiac Enzymes No results for input(s): CKMB, TROPONINI, MYOGLOBIN in the last 168 hours.  Invalid input(s): CK ------------------------------------------------------------------------------------------------------------------ Invalid input(s): POCBNP  ---------------------------------------------------------------------------------------------------------------  Urinalysis    Component Value Date/Time   COLORURINE Straw 01/02/2013 1611   APPEARANCEUR Clear 01/02/2013 1611   LABSPEC 1.006 01/02/2013 1611   PHURINE 7.0 01/02/2013 1611   GLUCOSEU 50 mg/dL 01/02/2013 1611   HGBUR 2+ 01/02/2013 1611   BILIRUBINUR Negative 01/02/2013 1611   KETONESUR Negative 01/02/2013 1611   PROTEINUR 30 mg/dL 01/02/2013 1611   NITRITE Negative 01/02/2013 1611   LEUKOCYTESUR Negative 01/02/2013 1611     RADIOLOGY: Ct Head Wo Contrast  Result Date: 10/14/2018 CLINICAL DATA:  Mental status changes. EXAM: CT HEAD WITHOUT CONTRAST TECHNIQUE: Contiguous axial images were obtained from the base of the skull through the vertex without intravenous contrast. COMPARISON:  Head CT 07/17/2010 FINDINGS: Brain: No evidence of acute infarction, hemorrhage, hydrocephalus, extra-axial collection or mass lesion/mass effect. Mild age advanced cerebral atrophy and slight ventriculomegaly. The brainstem and cerebellum appear grossly normal. Vascular: Age advanced atherosclerotic calcifications. No obvious aneurysm or hyperdense vessels. Skull: No skull fracture or bone lesions. Sinuses/Orbits: The paranasal sinuses and mastoid air cells are clear. Other: No scalp lesions or hematoma IMPRESSION: 1. No acute intracranial findings or  mass lesion. 2. Mild age advanced cerebral atrophy. Electronically Signed   By: Marijo Sanes M.D.   On: 10/14/2018 00:58   Dg Abdomen Acute W/chest  Result Date: 10/14/2018 CLINICAL DATA:  Acute onset of altered mental status. High blood pressure. Recently diagnosed with shingles. EXAM: DG ABDOMEN ACUTE W/ 1V CHEST COMPARISON:  Abdominal radiograph performed 07/06/2018, and chest radiograph performed 07/17/2010 FINDINGS: The lungs are well-aerated and clear. There is no evidence of focal opacification, pleural effusion or pneumothorax. The cardiomediastinal silhouette is within normal limits. The visualized bowel gas pattern is unremarkable. Scattered stool and air are seen within the colon; there is no evidence of small bowel dilatation to suggest obstruction. No free intra-abdominal air is identified on the provided upright view. No acute osseous abnormalities are seen; the sacroiliac joints are unremarkable in appearance. The peritoneal dialysis catheter is grossly unremarkable in appearance. Scattered vascular calcifications are seen within the pelvis. IMPRESSION: 1. Unremarkable bowel gas pattern; no free intra-abdominal air seen. Small amount of stool noted in the colon. 2. No acute cardiopulmonary process seen. Electronically Signed   By: Garald Balding M.D.   On: 10/14/2018 01:07    EKG: Orders placed or performed during the hospital encounter of 05/03/18  . ED EKG  . ED EKG  . EKG 12-Lead  . EKG 12-Lead  . ED EKG 12-Lead  . ED EKG 12-Lead  . EKG    IMPRESSION AND PLAN: *Acute encephalopathy Most likely secondary to medication side effect from prednisone and Valtrex-compounded by end-stage renal  disease Admit to regular nursing for bed, neurochecks per routine, aspiration/fall precautions, check ammonia level, RPR, discontinue Valtrex, prednisone already discontinued  *Chronic end-stage renal disease On peritoneal dialysis Nephrology consulted for hemodialysis needs  *Chronic  benign essential hypertension Stable Continue Toprol-XL, Lotensin, torsemide  *Acute shingles Conservative management Airborne and contact precautions Adult pain protocol  *Acute hyponatremia/hypochloremia Salt tabs 3 times daily, BMP in the morning, nephrology to see for hemodialysis needs  All the records are reviewed and case discussed with ED provider. Management plans discussed with the patient, family and they are in agreement.  CODE STATUS:full Code Status History    Date Active Date Inactive Code Status Order ID Comments User Context   05/03/2018 1749 05/09/2018 1842 Full Code [id number]  Loletha Grayer, MD ED       TOTAL TIME TAKING CARE OF THIS PATIENT: 40 minutes.    Avel Peace Farrell Broerman M.D on 10/15/2018   Between 7am to 6pm - Pager - [phone]  After 6pm go to [email or web address] - password [email or web address] [facility]  Office  [phone]  CC: Primary care physician; [facility]   Note: This dictation was prepared with Dragon dictation along with smaller phrase technology. Any transcriptional errors that result from this process are unintentional.

## 2018-10-15 NOTE — Progress Notes (Signed)
[facility]  ROUNDING NOTE   Subjective:  Patient well-known to [place]. On November 8 he was prescribed valacyclovir 500 mg p.o. every 8 hours for herpes zoster infection. Also was given prednisone at that time. Now presents with significant confusion. Per wife he appears to be laughing inappropriately at times.   Objective:  Vital signs in last 24 hours:  Temp:  [98 F (36.7 C)-98.3 F (36.8 C)] 98.3 F (36.8 C) (11/11 1848) Pulse Rate:  [73-80] 79 (11/11 1848) Resp:  [18-20] 19 (11/11 1848) BP: (144-176)/(91-93) 176/93 (11/11 1848) SpO2:  [93 %-99 %] 98 % (11/11 1848) Weight:  [73 kg] 73 kg (11/11 1555)  Weight change:  Filed Weights   10/15/18 1555  Weight: 73 kg    Intake/Output: No intake/output data recorded.   Intake/Output this shift:  No intake/output data recorded.  Physical Exam: General: No acute distress  Head: Normocephalic, atraumatic. Moist oral mucosal membranes  Eyes: Anicteric  Neck: Supple, trachea midline  Lungs:  Clear to auscultation, normal effort  Heart: S1S2 no rubs  Abdomen:  Soft, nontender, bowel sounds present  Extremities:  peripheral edema.  Neurologic: Awake, alert, following commands  Skin: Healing zoster lesions on chest  Access: PD catheter in place    Basic Metabolic Panel: Recent Labs  Lab 10/14/18 0013 10/15/18 1608  NA 130* 128*  K 4.1 4.1  CL 90* 86*  CO2 23 26  GLUCOSE 158* 107*  BUN 46* 51*  CREATININE 10.10* 10.53*  CALCIUM 9.0 8.0*    Liver Function Tests: Recent Labs  Lab 10/14/18 0013 10/15/18 1608  AST 18 17  ALT 21 18  ALKPHOS 92 85  BILITOT 0.8 0.8  PROT 7.2 6.9  ALBUMIN 4.5 4.2   No results for input(s): LIPASE, AMYLASE in the last 168 hours. Recent Labs  Lab 10/14/18 0013  AMMONIA 21    CBC: Recent Labs  Lab 10/14/18 0013 10/15/18 1608  WBC 9.9 8.8  NEUTROABS 8.2*  --   HGB 11.8* 11.9*  HCT 35.6* 34.9*  MCV 95.2 94.6  PLT 260 252    Cardiac Enzymes: No results  for input(s): CKTOTAL, CKMB, CKMBINDEX, TROPONINI in the last 168 hours.  BNP: Invalid input(s): POCBNP  CBG: No results for input(s): GLUCAP in the last 168 hours.  Microbiology: Results for orders placed or performed during the hospital encounter of 05/03/18  Blood Culture (routine x 2)     Status: None   Collection Time: 05/03/18  3:48 PM  Result Value Ref Range Status   Specimen Description BLOOD LT [facility]  Final   Special Requests   Final    BOTTLES DRAWN AEROBIC AND ANAEROBIC Blood Culture adequate volume   Culture   Final    NO GROWTH 5 DAYS Performed at [facility], [address] [place], [place] [address]    Report Status 05/08/2018 FINAL  Final  Blood Culture (routine x 2)     Status: None   Collection Time: 05/03/18  3:57 PM  Result Value Ref Range Status   Specimen Description BLOOD RT [facility]  Final   Special Requests   Final    BOTTLES DRAWN AEROBIC AND ANAEROBIC Blood Culture adequate volume   Culture   Final    NO GROWTH 5 DAYS Performed at [facility], [address], [place], [place] [address]    Report Status 05/08/2018 FINAL  Final  Body fluid culture     Status: None   Collection Time: 05/03/18  4:23 PM  Result Value Ref Range Status   Specimen Description   Final    PERITONEAL CAVITY Performed at [facility], [address] [place], [place] [address]    Special Requests   Final    Normal Performed at [facility], [address] [place], [place] [address]    Gram Stain   Final    RARE WBC PRESENT, PREDOMINANTLY PMN NO ORGANISMS SEEN    Culture   Final    NO GROWTH 3 DAYS Performed at [facility], [place] [address], [place], [place] [address]    Report Status 05/07/2018 FINAL  Final    Coagulation Studies: No results for input(s): LABPROT, INR in the last 72 hours.  Urinalysis: No results for input(s): COLORURINE, LABSPEC, PHURINE, GLUCOSEU, HGBUR, BILIRUBINUR, KETONESUR, PROTEINUR, UROBILINOGEN,  NITRITE, LEUKOCYTESUR in the last 72 hours.  Invalid input(s): APPERANCEUR    Imaging: Ct Head Wo Contrast  Result Date: 10/14/2018 CLINICAL DATA:  Mental status changes. EXAM: CT HEAD WITHOUT CONTRAST TECHNIQUE: Contiguous axial images were obtained from the base of the skull through the vertex without intravenous contrast. COMPARISON:  Head CT 07/17/2010 FINDINGS: Brain: No evidence of acute infarction, hemorrhage, hydrocephalus, extra-axial collection or mass lesion/mass effect. Mild age advanced cerebral atrophy and slight ventriculomegaly. The brainstem and cerebellum appear grossly normal. Vascular: Age advanced atherosclerotic calcifications. No obvious aneurysm or hyperdense vessels. Skull: No skull fracture or bone lesions. Sinuses/Orbits: The paranasal sinuses and mastoid air cells are clear. Other: No scalp lesions or hematoma IMPRESSION: 1. No acute intracranial findings or mass lesion. 2. Mild age advanced cerebral atrophy. Electronically Signed   By: Marijo Sanes M.D.   On: 10/14/2018 00:58   Dg Abdomen Acute W/chest  Result Date: 10/14/2018 CLINICAL DATA:  Acute onset of altered mental status. High blood pressure. Recently diagnosed with shingles. EXAM: DG ABDOMEN ACUTE W/ 1V CHEST COMPARISON:  Abdominal radiograph performed 07/06/2018, and chest radiograph performed 07/17/2010 FINDINGS: The lungs are well-aerated and clear. There is no evidence of focal opacification, pleural effusion or pneumothorax. The cardiomediastinal silhouette is within normal limits. The visualized bowel gas pattern is unremarkable. Scattered stool and air are seen within the colon; there is no evidence of small bowel dilatation to suggest obstruction. No free intra-abdominal air is identified on the provided upright view. No acute osseous abnormalities are seen; the sacroiliac joints are unremarkable in appearance. The peritoneal dialysis catheter is grossly unremarkable in appearance. Scattered vascular  calcifications are seen within the pelvis. IMPRESSION: 1. Unremarkable bowel gas pattern; no free intra-abdominal air seen. Small amount of stool noted in the colon. 2. No acute cardiopulmonary process seen. Electronically Signed   By: Garald Balding M.D.   On: 10/14/2018 01:07     Medications:   . sodium chloride     . benazepril  20 mg Oral Daily  . [START ON 10/16/2018] calcium acetate  1,334 mg Oral TID WC  . [START ON 10/16/2018] cinacalcet  90 mg Oral BID WC  . heparin  5,000 Units Subcutaneous Q8H  . metoprolol succinate  50 mg Oral Daily  . multivitamin with minerals   Oral Daily  . sodium chloride flush  3 mL Intravenous Q12H  . sodium chloride  2 g Oral TID WC  . torsemide  100 mg Oral Daily   sodium chloride, acetaminophen **OR** acetaminophen, hydrALAZINE, HYDROcodone-acetaminophen, ondansetron **OR** ondansetron (ZOFRAN) IV, polyethylene glycol, sodium chloride flush, traZODone, triamcinolone cream  Assessment/ Plan:  57 y.o. male with  ESRD on PD, anemia of chronic kidney disease, secondary hyperparathyroidism, gout, history of small bowel obstruction, herpes zoster infection   1.  ESRD on peritoneal dialysis.  Patient has not had peritoneal dialysis the last 2 nights given altered mental status likely from valacyclovir use.  Hold further valacyclovir.  Resume peritoneal dialysis tonight.  2.  Altered mental status.  Suspect that this is secondary to a combination of valacyclovir and prednisone however more likely valacyclovir as we have seen similar cases.  He was on valacyclovir 500 mg p.o. every 8 hours.  3.  Anemia of chronic kidney disease.  Hemoglobin currently 11.9.  Hold off on Epogen at this time.  4.  Secondary hyperparathyroidism.  Continue to periodically monitor bone mineral metabolism parameters.   LOS: 0 Enrica Corliss 11/11/20196:58 PM

## 2018-10-15 NOTE — ED Triage Notes (Addendum)
Pt presents via ems called by wife for AMS. Pt is lethargic but arrousable. Odd speech pattern, slurred per interpretor. Pt recently started steroids and antivirals for shingles

## 2018-10-15 NOTE — ED Notes (Signed)
Correction to triage-pt is not on steroids

## 2018-10-15 NOTE — ED Notes (Signed)
ED TO INPATIENT HANDOFF REPORT  Name/Age/Gender Matthew Mathis 57 y.o. male  Code Status Code Status History    Date Active Date Inactive Code Status Order ID Comments User Context   05/03/2018 1749 05/09/2018 1842 Full Code [id number]  Loletha Grayer, MD ED      Home/SNF/Other home  Chief Complaint AMS  Level of Care/Admitting Diagnosis ED Disposition    ED Disposition Condition [place]: [place] [100120]  Level of Care: Med-Surg [16]  Diagnosis: Encephalopathy acute [247508]  Admitting Physician: Gorden Harms [[id number]]  Attending Physician: Gorden Harms [[id number]]  Estimated length of stay: past midnight tomorrow  Certification:: I certify this patient will need inpatient services for at least 2 midnights  PT Class (Do Not Modify): Inpatient [101]  PT Acc Code (Do Not Modify): Private [1]       Medical History Past Medical History:  Diagnosis Date  . Chronic renal failure    hx   . Gout    hx  . HTN (hypertension)     Allergies No Known Allergies  IV Location/Drains/Wounds Patient Lines/Drains/Airways Status   Active Line/Drains/Airways    Name:   Placement date:   Placement time:   Site:   Days:   Peripheral IV 10/15/18 Left Forearm   10/15/18    1600    Forearm   less than 1   CVC Double Lumen 05/07/18 Right Internal jugular 19 cm   05/07/18    1524     161          Labs/Imaging Results for orders placed or performed during the hospital encounter of 10/15/18 (from the past 48 hour(s))  CBC     Status: Abnormal   Collection Time: 10/15/18  4:08 PM  Result Value Ref Range   WBC 8.8 4.0 - 10.5 K/uL   RBC 3.69 (L) 4.22 - 5.81 MIL/uL   Hemoglobin 11.9 (L) 13.0 - 17.0 g/dL   HCT 34.9 (L) 39.0 - 52.0 %   MCV 94.6 80.0 - 100.0 fL   MCH 32.2 26.0 - 34.0 pg   MCHC 34.1 30.0 - 36.0 g/dL   RDW 15.2 11.5 - 15.5 %   Platelets 252 150 - 400 K/uL   nRBC 0.0 0.0 - 0.2 %    Comment: Performed at [facility], [address] [place], [place] [address]  Comprehensive metabolic panel     Status: Abnormal   Collection Time: 10/15/18  4:08 PM  Result Value Ref Range   Sodium 128 (L) 135 - 145 mmol/L   Potassium 4.1 3.5 - 5.1 mmol/L   Chloride 86 (L) 98 - 111 mmol/L   CO2 26 22 - 32 mmol/L   Glucose, Bld 107 (H) 70 - 99 mg/dL   BUN 51 (H) 6 - 20 mg/dL   Creatinine, Ser 10.53 (H) 0.61 - 1.24 mg/dL   Calcium 8.0 (L) 8.9 - 10.3 mg/dL   Total Protein 6.9 6.5 - 8.1 g/dL   Albumin 4.2 3.5 - 5.0 g/dL   AST 17 15 - 41 U/L   ALT 18 0 - 44 U/L   Alkaline Phosphatase 85 38 - 126 U/L   Total Bilirubin 0.8 0.3 - 1.2 mg/dL   GFR calc non Af Amer 5 (L) >60 mL/min   GFR calc Af Amer 5 (L) >60 mL/min    Comment: (NOTE) The eGFR has been calculated using the CKD EPI equation. This calculation has not been validated  in all clinical situations. eGFR's persistently <60 mL/min signify possible Chronic Kidney Disease.    Anion gap 16 (H) 5 - 15    Comment: Performed at [facility], [address] [place], [place] [address]  Ethanol     Status: None   Collection Time: 10/15/18  4:08 PM  Result Value Ref Range   Alcohol, Ethyl (B) <10 <10 mg/dL    Comment: (NOTE) Lowest detectable limit for serum alcohol is 10 mg/dL. For medical purposes only. Performed at [facility], [address] [place], [place] [address]    Ct Head Wo Contrast  Result Date: 10/14/2018 CLINICAL DATA:  Mental status changes. EXAM: CT HEAD WITHOUT CONTRAST TECHNIQUE: Contiguous axial images were obtained from the base of the skull through the vertex without intravenous contrast. COMPARISON:  Head CT 07/17/2010 FINDINGS: Brain: No evidence of acute infarction, hemorrhage, hydrocephalus, extra-axial collection or mass lesion/mass effect. Mild age advanced cerebral atrophy and slight ventriculomegaly. The brainstem and cerebellum appear grossly normal. Vascular: Age advanced atherosclerotic  calcifications. No obvious aneurysm or hyperdense vessels. Skull: No skull fracture or bone lesions. Sinuses/Orbits: The paranasal sinuses and mastoid air cells are clear. Other: No scalp lesions or hematoma IMPRESSION: 1. No acute intracranial findings or mass lesion. 2. Mild age advanced cerebral atrophy. Electronically Signed   By: Marijo Sanes M.D.   On: 10/14/2018 00:58   Dg Abdomen Acute W/chest  Result Date: 10/14/2018 CLINICAL DATA:  Acute onset of altered mental status. High blood pressure. Recently diagnosed with shingles. EXAM: DG ABDOMEN ACUTE W/ 1V CHEST COMPARISON:  Abdominal radiograph performed 07/06/2018, and chest radiograph performed 07/17/2010 FINDINGS: The lungs are well-aerated and clear. There is no evidence of focal opacification, pleural effusion or pneumothorax. The cardiomediastinal silhouette is within normal limits. The visualized bowel gas pattern is unremarkable. Scattered stool and air are seen within the colon; there is no evidence of small bowel dilatation to suggest obstruction. No free intra-abdominal air is identified on the provided upright view. No acute osseous abnormalities are seen; the sacroiliac joints are unremarkable in appearance. The peritoneal dialysis catheter is grossly unremarkable in appearance. Scattered vascular calcifications are seen within the pelvis. IMPRESSION: 1. Unremarkable bowel gas pattern; no free intra-abdominal air seen. Small amount of stool noted in the colon. 2. No acute cardiopulmonary process seen. Electronically Signed   By: Garald Balding M.D.   On: 10/14/2018 01:07    Pending Labs Unresulted Labs (From admission, onward)    Start     Ordered   10/15/18 1606  Urinalysis, Routine w reflex microscopic  Once,   STAT     10/15/18 1605   Signed and Held  CBC  (heparin)  Once,   R    Comments:  Baseline for heparin therapy IF NOT ALREADY DRAWN.  Notify MD if PLT < 100 K.    Signed and Held   Signed and Held  Creatinine, serum   (heparin)  Once,   R    Comments:  Baseline for heparin therapy IF NOT ALREADY DRAWN.    Signed and Held   Signed and Held  TSH  Once,   R     Signed and Held   Signed and [employer] metabolic panel  Tomorrow morning,   R     Signed and Held   Signed and Held  Ammonia  Once,   R     Signed and Held   Signed and Held  RPR  Once,  R     Signed and Held          Vitals/Pain Today's Vitals   10/15/18 1555 10/15/18 1617 10/15/18 1630 10/15/18 1800  BP:   (!) 144/91 (!) 145/91  Pulse:   73 75  Resp:   18 18  Temp:  98 F (36.7 C)    TempSrc:  Oral    SpO2:   98% 93%  Weight: 73 kg     Height: 5' 7" (1.702 m)       Isolation Precautions No active isolations  Medications Medications - No data to display  Mobility unknown

## 2018-10-15 NOTE — Progress Notes (Signed)
Pre CCPD Assessment, pt is calm and cooperative, denies pain, in no apparent distress.    10/15/18 2100  Neurological  Level of Consciousness Alert  Orientation Level Oriented to person  Respiratory  Respiratory Pattern Regular;Unlabored  Bilateral Breath Sounds Clear  Cough None  Psychosocial  Psychosocial (WDL) WDL

## 2018-10-15 NOTE — ED Provider Notes (Signed)
[facility] Emergency Department Provider Note  Time seen: 4:06 PM  I have reviewed the triage vital signs and the nursing notes.   HISTORY  Chief Complaint No chief complaint on file.    HPI Costantino Kohlbeck is a 57 y.o. 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  According to the wife via interpreter the patient was diagnosed with shingles on Friday and was prescribed prednisone and valacyclovir.  Took prednisone Friday as well as Saturday, by Saturday the patient was acting abnormal, laughing and appropriately, came to the emergency department for evaluation.  Evaluation including CT scan was largely within normal limits and the patient was told to discontinue prednisone but to continue valacyclovir.  Wife states she discontinued prednisone, but today the patient continues to act abnormal, the patient could not remember how to hook himself up to peritoneal dialysis at home so she brought him back to the emergency department for evaluation.  Currently the patient is awake he is alert, he is oriented to person place and time.  Does laugh occasionally, somewhat inappropriately.   Past Medical History:  Diagnosis Date  . Chronic renal failure    hx   . Gout    hx  . HTN (hypertension)     Patient Active Problem List   Diagnosis Date Noted  . Small bowel obstruction ([place]) 05/03/2018  . End stage renal disease ([place]) 05/08/2017  . Complication of renal dialysis device 05/08/2017  . HYPERTENSION, BENIGN 10/01/2010  . CHEST PAIN UNSPECIFIED 10/01/2010    Past Surgical History:  Procedure Laterality Date  . CAPD INSERTION Right 05/24/2017   Procedure: LAPAROSCOPIC INSERTION CONTINUOUS AMBULATORY PERITONEAL DIALYSIS  (CAPD) CATHETER;  Surgeon: Katha Cabal, MD;  Location: [facility] ORS;  Service: Vascular;  Laterality: Right;  .  DIALYSIS/PERMA CATHETER INSERTION N/A 05/07/2018   Procedure: DIALYSIS/PERMA CATHETER INSERTION;  Surgeon: Algernon Huxley, MD;  Location: [facility];  Service: Cardiovascular;  Laterality: N/A;  . DIALYSIS/PERMA CATHETER REMOVAL N/A 06/27/2017   Procedure: Dialysis/Perma Catheter Removal;  Surgeon: Katha Cabal, MD;  Location: [facility];  Service: Cardiovascular;  Laterality: N/A;  . DIALYSIS/PERMA CATHETER REMOVAL N/A 07/18/2018   Procedure: DIALYSIS/PERMA CATHETER REMOVAL;  Surgeon: Katha Cabal, MD;  Location: [place];  Service: Cardiovascular;  Laterality: N/A;  . INSERTION OF DIALYSIS CATHETER Right 05/24/2017   Procedure: INSERTION OF DIALYSIS CATHETER ( TUNNELED CATH );  Surgeon: Katha Cabal, MD;  Location: [facility] ORS;  Service: Vascular;  Laterality: Right;  . PERITONEAL CATHETER INSERTION    . REMOVAL OF A DIALYSIS CATHETER Left 05/24/2017   Procedure: REMOVAL OF A DIALYSIS CATHETER ( PD CATH );  Surgeon: Katha Cabal, MD;  Location: [facility] ORS;  Service: Vascular;  Laterality: Left;  . RENAL BIOPSY     non specific     Prior to Admission medications   Medication Sig Start Date End Date Taking? Authorizing Provider  acetaminophen (TYLENOL) 325 MG tablet Take 2 tablets (650 mg total) by mouth every 6 (six) hours as needed for mild pain (or Fever >/= 101). Patient not taking: Reported on 07/17/2018 05/09/18   Nicholes Mango, MD  allopurinol (ZYLOPRIM) 100 MG tablet Take 100 mg by mouth every evening.  07/25/16   [provider]  benazepril (LOTENSIN) 20 MG tablet Take 20 mg by mouth daily. 07/02/18   [provider]  calcium acetate (PHOSLO) 667 MG capsule Take 667-1,334 mg by mouth See admin instructions. Take 1334 mg by mouth three times a day and take 667 mg by mouth with snacks.    [provider]  cinacalcet (SENSIPAR) 60 MG tablet Take 90 mg by mouth 2 (two) times daily with a meal.     [provider]   ketoconazole (NIZORAL) 2 % shampoo Apply 1 application topically 3 (three) times a week.  02/25/18   [provider]  metoprolol succinate (TOPROL-XL) 50 MG 24 hr tablet Take 1 tablet (50 mg total) by mouth daily. Take with or immediately following a meal. 05/10/18   Gouru, Aruna, MD  Multiple Vitamins-Minerals (DIALYVITE 800/ULTRA D) TABS Take 1 tablet by mouth daily. 05/22/17   [provider]  sevelamer carbonate (RENVELA) 800 MG tablet Take 2 tablets (1,600 mg total) by mouth 3 (three) times daily with meals. Patient not taking: Reported on 07/17/2018 05/09/18   Nicholes Mango, MD  torsemide (DEMADEX) 100 MG tablet Take 100 mg by mouth daily. 09/07/18   [provider]  triamcinolone cream (KENALOG) 0.1 % Apply 1 application topically 2 (two) times daily as needed.     [provider]  valACYclovir (VALTREX) 1000 MG tablet 500 mg 3 (three) times daily.  10/12/18   [provider]    No Known Allergies  Family History  Problem Relation Age of Onset  . Diabetes Father   . CAD Father   . Hypertension Mother        brother, sister   . Kidney disease Mother     Social History Social History   Tobacco Use  . Smoking status: Former Smoker    Packs/day: 0.50    Types: Cigarettes    Last attempt to quit: 12/04/2010    Years since quitting: 7.8  . Smokeless tobacco: Never Used  . Tobacco comment: used to smoke 2-3 ppd, now smokes 3 cigarettes daily   Substance Use Topics  . Alcohol use: No  . Drug use: No    Review of Systems Constitutional: Negative for fever. Cardiovascular: Negative for chest pain. Respiratory: Negative for shortness of breath. Gastrointestinal: Negative for abdominal pain, vomiting and diarrhea. Genitourinary: Urinated yesterday, has not urinated today. Musculoskeletal: Negative for musculoskeletal complaints Skin: Negative for skin complaints  Neurological: Negative for headache All other ROS  negative  ____________________________________________   PHYSICAL EXAM:  VITAL SIGNS: ED Triage Vitals  Enc Vitals Group     BP --      Pulse Rate 10/15/18 1554 80     Resp 10/15/18 1554 20     Temp --      Temp src --      SpO2 10/15/18 1554 99 %     Weight 10/15/18 1555 160 lb 15 oz (73 kg)     Height 10/15/18 1555 5\' 7"  (1.702 m)     Head Circumference --      Peak Flow --      Pain Score --      Pain Loc --      Pain Edu? --      Excl. in [place]? --    Constitutional: Alert and oriented. Well appearing and in no distress. Eyes: Normal exam ENT   Head: Normocephalic and atraumatic.   Mouth/Throat: Mucous membranes are moist. Cardiovascular: Normal rate, regular rhythm. No murmur Respiratory: Normal respiratory effort without tachypnea nor retractions. Breath sounds are clear Gastrointestinal: Soft and nontender. No distention.  Musculoskeletal: Nontender  with normal range of motion in all extremities.  Neurologic:  Normal speech and language. No gross focal neurologic deficits Skin: Rash involving the left shoulder and left chest consistent with shingles does not cross the midline. Psychiatric: Mood and affect are normal.  ____________________________________________    INITIAL IMPRESSION / ASSESSMENT AND PLAN / ED COURSE  Pertinent labs & imaging results that were available during my care of the patient were reviewed by me and considered in my medical decision making (see chart for details).  Patient presents to the emergency department for altered mental status since Friday night per wife.  Started prednisone and valacyclovir Friday morning for shingles to the left chest.  Largely negative work-up yesterday in the emergency department and was ultimately discharged home.  Continues to act abnormal at times at home and cannot remember how to hook himself up to dialysis today so they came back to the emergency department for evaluation.  Differential would include  infectious etiology however the patient is afebrile with no headache, no neck pain no nuchal rigidity.  Electrolyte or metabolic abnormality, uremia, UTI.  Reassuringly patient has a benign abdominal exam denies any tenderness to palpation.  We will check labs and discuss with nephrology.  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  Patient's labs are resulted largely at the patient's baseline.  I discussed the patient with Dr. Holley Raring of nephrology, given the patient was on high-dose valacyclovir he believes this is likely the cause of his altered mental status, as the patient has not been able to do peritoneal dialysis at home due to confusion we will admit to the hospitalist service for peritoneal dialysis which should help his altered mental status.  Wife and patient are agreeable to this plan of care. ____________________________________________   FINAL CLINICAL IMPRESSION(S) / ED DIAGNOSES  Altered mental status    Harvest Dark, MD 10/15/18 1736

## 2018-10-15 NOTE — ED Notes (Signed)
ED Provider at bedside. 

## 2018-10-15 NOTE — ED Notes (Signed)
Triage, initial assessment, and doctor's assessment done with help of interpretor  [id number]

## 2018-10-15 NOTE — ED Notes (Signed)
RN attempted to call report, informed that charge has not assigned a nurse to the room yet.

## 2018-10-15 NOTE — Progress Notes (Signed)
Family Meeting Note  Advance Directive:yes  Today a meeting took place with the Patient.  Patient is unable to participate due [id number] capacity confused   The following clinical team members were present during this meeting:MD  The following were discussed:Patient's diagnosis: encephalopathy, Patient's progosis: Unable to determine and Goals for treatment: Full Code  Additional follow-up to be provided: prn  Time spent during discussion:20 minutes  Gorden Harms, MD

## 2018-10-16 ENCOUNTER — Inpatient Hospital Stay: Payer: Medicare Other

## 2018-10-16 DIAGNOSIS — N186 End stage renal disease: Secondary | ICD-10-CM

## 2018-10-16 DIAGNOSIS — Z7682 Awaiting organ transplant status: Principal | ICD-10-CM

## 2018-10-16 DIAGNOSIS — Z01818 Encounter for other preprocedural examination: Secondary | ICD-10-CM

## 2018-10-16 DIAGNOSIS — G9340 Encephalopathy, unspecified: Secondary | ICD-10-CM

## 2018-10-16 LAB — BASIC METABOLIC PANEL
Anion gap: 14 (ref 5–15)
BUN: 52 mg/dL — ABNORMAL HIGH (ref 6–20)
CALCIUM: 8.1 mg/dL — AB (ref 8.9–10.3)
CO2: 25 mmol/L (ref 22–32)
CREATININE: 10.5 mg/dL — AB (ref 0.61–1.24)
Chloride: 93 mmol/L — ABNORMAL LOW (ref 98–111)
GFR, EST AFRICAN AMERICAN: 5 mL/min — AB (ref >60)
GFR, EST NON AFRICAN AMERICAN: 5 mL/min — AB (ref >60)
Glucose, Bld: 142 mg/dL — ABNORMAL HIGH (ref 70–99)
Potassium: 3.7 mmol/L (ref 3.5–5.1)
SODIUM: 132 mmol/L — AB (ref 135–145)

## 2018-10-16 NOTE — Plan of Care (Signed)
  Problem: Clinical Measurements: Goal: Will remain free from infection Outcome: Progressing Note:  Remains afebrile Goal: Cardiovascular complication will be avoided Outcome: Progressing Note:  No arrhythmias today   Problem: Fluid Volume: Goal: Compliance with measures to maintain balanced fluid volume will improve Outcome: Progressing Note:  Pt does PD, site clean and dry   Problem: Clinical Measurements: Goal: Diagnostic test results will improve Outcome: Not Progressing Note:  NA 132 BUN 52/10.5

## 2018-10-16 NOTE — Consult Note (Signed)
Reason for Consult:Altered mental status Referring Physician: Leslye Peer  CC: Altered mental status  HPI: Matthew Mathis is an 57 y.o. male with a history of ESRD who presented on yesterday with altered mental status.   Patient was recently diagnosed with shingles and was started on high dose Valacyclovir and prednisone.  Patient developed odd behavior, confusion, problems with speech the following day, patient brought to the emergency room for further evaluation.  Despite discontinuation of Valacyclovir and dialysis patient has remained altered.    Past Medical History:  Diagnosis Date  . Chronic renal failure    hx   . Gout    hx  . HTN (hypertension)     Past Surgical History:  Procedure Laterality Date  . CAPD INSERTION Right 05/24/2017   Procedure: LAPAROSCOPIC INSERTION CONTINUOUS AMBULATORY PERITONEAL DIALYSIS  (CAPD) CATHETER;  Surgeon: Katha Cabal, MD;  Location: [facility];  Service: Vascular;  Laterality: Right;  . DIALYSIS/PERMA CATHETER INSERTION N/A 05/07/2018   Procedure: DIALYSIS/PERMA CATHETER INSERTION;  Surgeon: Algernon Huxley, MD;  Location: [facility];  Service: Cardiovascular;  Laterality: N/A;  . DIALYSIS/PERMA CATHETER REMOVAL N/A 06/27/2017   Procedure: Dialysis/Perma Catheter Removal;  Surgeon: Katha Cabal, MD;  Location: [facility];  Service: Cardiovascular;  Laterality: N/A;  . DIALYSIS/PERMA CATHETER REMOVAL N/A 07/18/2018   Procedure: DIALYSIS/PERMA CATHETER REMOVAL;  Surgeon: Katha Cabal, MD;  Location: [facility];  Service: Cardiovascular;  Laterality: N/A;  . INSERTION OF DIALYSIS CATHETER Right 05/24/2017   Procedure: INSERTION OF DIALYSIS CATHETER ( TUNNELED CATH );  Surgeon: Katha Cabal, MD;  Location: [facility];  Service: Vascular;  Laterality: Right;  . PERITONEAL CATHETER INSERTION    . REMOVAL OF A DIALYSIS CATHETER Left 05/24/2017   Procedure: REMOVAL OF A DIALYSIS CATHETER ( PD CATH );  Surgeon: Katha Cabal, MD;  Location: [facility];  Service: Vascular;  Laterality: Left;  . RENAL BIOPSY     non specific     Family History  Problem Relation Age of Onset  . Diabetes Father   . CAD Father   . Hypertension Mother        brother, sister   . Kidney disease Mother     Social History:  reports that he quit smoking about 7 years ago. His smoking use included cigarettes. He smoked 0.50 packs per day. He has never used smokeless tobacco. He reports that he does not drink alcohol or use drugs.  No Known Allergies  Medications:  I have reviewed the patient's current medications. Prior to Admission:  Medications Prior to Admission  Medication Sig Dispense Refill Last Dose  . allopurinol (ZYLOPRIM) 100 MG tablet Take 100 mg by mouth every evening.    10/13/2018 at pm  . benazepril (LOTENSIN) 20 MG tablet Take 20 mg by mouth daily.  11 10/13/2018 at am  . calcium acetate (PHOSLO) 667 MG capsule Take 667-1,334 mg by mouth See admin instructions. Take 1334 mg by mouth three times a day and take 667 mg by mouth with snacks.   10/13/2018 at pm  . cinacalcet (SENSIPAR) 60 MG tablet Take 90 mg by mouth 2 (two) times daily with a meal.    10/13/2018 at pm  . ketoconazole (NIZORAL) 2 % shampoo Apply 1 application topically 3 (three) times a week.   5 Past Week at Unknown time  . metoprolol succinate (TOPROL-XL) 50 MG 24 hr tablet Take 1 tablet (50 mg total) by mouth daily.  Take with or immediately following a meal. 30 tablet 0 10/13/2018 at unknown  . Multiple Vitamins-Minerals (DIALYVITE 800/ULTRA D) TABS Take 1 tablet by mouth daily.  3 10/13/2018 at am  . torsemide (DEMADEX) 100 MG tablet Take 100 mg by mouth daily.  12 10/13/2018 at am  . triamcinolone cream (KENALOG) 0.1 % Apply 1 application topically 2 (two) times daily as needed (for inflammation).    unknown at unknown  . valACYclovir (VALTREX) 1000 MG tablet Take 500 mg by mouth 3 (three) times daily.    10/13/2018 at pm  . sevelamer carbonate  (RENVELA) 800 MG tablet Take 2 tablets (1,600 mg total) by mouth 3 (three) times daily with meals. (Patient not taking: Reported on 07/17/2018) 90 tablet 0 Not Taking at Unknown time   Scheduled: . benazepril  20 mg Oral Daily  . calcium acetate  1,334 mg Oral TID WC  . cinacalcet  90 mg Oral BID WC  . gentamicin cream  1 application Topical Daily  . heparin  5,000 Units Subcutaneous Q8H  . metoprolol succinate  50 mg Oral Daily  . multivitamin with minerals   Oral Daily  . sodium chloride flush  3 mL Intravenous Q12H  . sodium chloride  2 g Oral TID WC  . torsemide  100 mg Oral Daily    ROS: Unable to provide due to mental status  Physical Examination: Blood pressure (!) 153/90, pulse 71, temperature (!) 97.5 F (36.4 C), temperature source Oral, resp. rate 18, height 5\' 7"  (1.702 m), weight 70.8 kg, SpO2 99 %.  HEENT-  Normocephalic, no lesions, without obvious abnormality.  Normal external eye and conjunctiva.  Normal TM's bilaterally.  Normal auditory canals and external ears. Normal external nose, mucus membranes and septum.  Normal pharynx. Cardiovascular- S1, S2 normal, pulses palpable throughout   Lungs- chest clear, no wheezing, rales, normal symmetric air entry Abdomen- soft, non-tender; bowel sounds normal; no masses,  no organomegaly Extremities- no edema Lymph-no adenopathy palpable Musculoskeletal-no joint tenderness, deformity or swelling Skin-warm and dry, no hyperpigmentation, vitiligo, or suspicious lesions  Neurological Examination   Mental Status: Alert.  Difficulty following commands in [place].  Does better in his native language but still requires significant reinforcement. Speech minimal. Cranial Nerves: II: Blinks to bilateral confrontation III,IV, VI: ptosis not present, extra-ocular motions intact bilaterally V,VII: smile symmetric, facial light touch sensation normal bilaterally VIII: hearing normal bilaterally IX,X: gag reflex present XI: bilateral  shoulder shrug XII: midline tongue extension Motor: Moves all extremities against gravity and able to maintain. Sensory: Appreciates stimulation in all extremities Deep Tendon Reflexes: 2+ in the upper extremities and absent in the lower extremities Plantars: Right: mute   Left: downgoing Cerebellar: Unable to perform Gait: not tested due to safety concerns  Laboratory Studies:   Basic Metabolic Panel: Recent Labs  Lab 10/14/18 0013 10/15/18 1608 10/16/18 0300  NA 130* 128* 132*  K 4.1 4.1 3.7  CL 90* 86* 93*  CO2 23 26 25   GLUCOSE 158* 107* 142*  BUN 46* 51* 52*  CREATININE 10.10* 10.53* 10.50*  CALCIUM 9.0 8.0* 8.1*    Liver Function Tests: Recent Labs  Lab 10/14/18 0013 10/15/18 1608  AST 18 17  ALT 21 18  ALKPHOS 92 85  BILITOT 0.8 0.8  PROT 7.2 6.9  ALBUMIN 4.5 4.2   No results for input(s): LIPASE, AMYLASE in the last 168 hours. Recent Labs  Lab 10/14/18 0013 10/15/18 1859  AMMONIA 21 14    CBC: Recent  Labs  Lab 10/14/18 0013 10/15/18 1608  WBC 9.9 8.8  NEUTROABS 8.2*  --   HGB 11.8* 11.9*  HCT 35.6* 34.9*  MCV 95.2 94.6  PLT 260 252    Cardiac Enzymes: No results for input(s): CKTOTAL, CKMB, CKMBINDEX, TROPONINI in the last 168 hours.  BNP: Invalid input(s): POCBNP  CBG: No results for input(s): GLUCAP in the last 168 hours.  Microbiology: Results for orders placed or performed during the hospital encounter of 05/03/18  Blood Culture (routine x 2)     Status: None   Collection Time: 05/03/18  3:48 PM  Result Value Ref Range Status   Specimen Description BLOOD LT [facility]  Final   Special Requests   Final    BOTTLES DRAWN AEROBIC AND ANAEROBIC Blood Culture adequate volume   Culture   Final    NO GROWTH 5 DAYS Performed at [facility], [address], [place], [place] [address]    Report Status 05/08/2018 FINAL  Final  Blood Culture (routine x 2)     Status: None   Collection Time: 05/03/18  3:57 PM  Result Value Ref  Range Status   Specimen Description BLOOD RT [facility]  Final   Special Requests   Final    BOTTLES DRAWN AEROBIC AND ANAEROBIC Blood Culture adequate volume   Culture   Final    NO GROWTH 5 DAYS Performed at [facility], [address] [place], [place] [address]    Report Status 05/08/2018 FINAL  Final  Body fluid culture     Status: None   Collection Time: 05/03/18  4:23 PM  Result Value Ref Range Status   Specimen Description   Final    PERITONEAL CAVITY Performed at [facility], [address] [place], [place] [address]    Special Requests   Final    Normal Performed at [facility], [address] [place], [place] [address]    Gram Stain   Final    RARE WBC PRESENT, PREDOMINANTLY PMN NO ORGANISMS SEEN    Culture   Final    NO GROWTH 3 DAYS Performed at [facility], [place] [address], [place], [place] [address]    Report Status 05/07/2018 FINAL  Final    Coagulation Studies: No results for input(s): LABPROT, INR in the last 72 hours.  Urinalysis:  Recent Labs  Lab 10/15/18 2329  COLORURINE STRAW*  LABSPEC 1.009  PHURINE 8.0  GLUCOSEU 150*  HGBUR SMALL*  BILIRUBINUR NEGATIVE  KETONESUR NEGATIVE  PROTEINUR 30*  NITRITE NEGATIVE  LEUKOCYTESUR NEGATIVE    Lipid Panel:  No results found for: CHOL, TRIG, HDL, CHOLHDL, VLDL, LDLCALC  HgbA1C: No results found for: HGBA1C  Urine Drug Screen:  No results found for: LABOPIA, COCAINSCRNUR, LABBENZ, AMPHETMU, THCU, LABBARB  Alcohol Level:  Recent Labs  Lab 10/14/18 0013 10/15/18 1608  ETH <10 <10    Imaging: No results found.   Assessment/Plan: 57 year old male with ESRD recently started on high dose Valacyclovir.  Valacyclovir discontinued and patient dialyzed (had missed two sessions due to confusion).  Despite this has not significantly improved.  Although patient may still be experiencing side effects from Valacyclovir and the other metabolic abnormalities noted on  admission will rule out other possibilities.    Recommendations: 1.  EEG 2.  MRI of the brain without contrast 3.  Will continue to follow with you   Alexis Goodell, MD Neurology [phone] 10/16/2018, 6:08 PM

## 2018-10-16 NOTE — Plan of Care (Signed)
  Problem: Safety: Goal: Ability to remain free from injury will improve Outcome: Progressing   Problem: Skin Integrity: Goal: Risk for impaired skin integrity will decrease Outcome: Progressing   

## 2018-10-16 NOTE — Procedures (Signed)
ELECTROENCEPHALOGRAM REPORT   Patient: Matthew Mathis       Room #: 233A-AA EEG No. ID: [id number] Age: 57 y.o.        Sex: male Referring Physician: Leslye Peer Report Date:  10/16/2018        Interpreting Physician: Alexis Goodell  History: Matthew Mathis is an 57 y.o. male with altered mental status  Medications:  Lotensin, Phoslo, Sensipar, Toprol, MVI, Demadex  Conditions of Recording:  This is a 21 channel routine scalp EEG performed with bipolar and monopolar montages arranged in accordance to the international 10/20 system of electrode placement. One channel was dedicated to EKG recording.  The patient is in the altered state.  Description:  The background activity is continuous but disorganized.  There are a mixture of frequencies with no dominant frequency noted.  This activity is low voltage.  Intermittently there are periods of slowing that consist of moderate to high voltage polymorphic delta activity as well.  No epileptiform activity is noted.   Hyperventilation was not performed.  Intermittent photic stimulation was performed but failed to illicit any change in the tracing.    IMPRESSION: This is an abnormal EEG secondary to general background slowing.  This finding may be seen with a diffuse disturbance that is etiologically nonspecific, but may include a metabolic encephalopathy, among other possibilities.  No epileptiform activity was noted.     Alexis Goodell, MD Neurology [phone] 10/16/2018, 5:34 PM

## 2018-10-16 NOTE — Progress Notes (Signed)
Pt alert this morning, oriented to person and place.  Wife at bedside.  Pt has scabbed over shingles on lt chest/back/arm area.  Will discuss with MD about discontinuing isolation.  PD still dwelling at this time.

## 2018-10-16 NOTE — Progress Notes (Signed)
eeg completed ° °

## 2018-10-16 NOTE — Progress Notes (Signed)
Patient can understand and speak English but at times prefers to speak [place].

## 2018-10-16 NOTE — Progress Notes (Signed)
Pt unhooked from PD, stood him up on scale to weigh him and he voided all over scale.  Cleaned up and back to bed

## 2018-10-16 NOTE — Progress Notes (Signed)
Spoke with Dialysis RN re: unhooking PD from patient, will be calling PD nurse to come unhook pt.

## 2018-10-16 NOTE — Progress Notes (Signed)
Patient ID: Matthew Mathis, male   DOB: [birth year], 57 y.o.   MRN: [id number]  [employer] PROGRESS NOTE  Matthew Mathis [id number] DOB: 1961-12-04 DOA: 10/15/2018 PCP: [facility]  HPI/Subjective: Patient brought in with altered mental status.  Patient able to answer few questions but assessing what I am saying is a little bit difficult.  Family at the bedside.  He was prescribed Valtrex 500 mg 3 times daily for shingles on his left chest.  He was unable to do PD for the last couple nights.  Objective: Vitals:   10/16/18 0607 10/16/18 0803  BP: (!) 160/96 (!) 163/88  Pulse: 77 74  Resp: 18   Temp: (!) 97.3 F (36.3 C) 98.6 F (37 C)  SpO2: 99% 99%    Filed Weights   10/15/18 1555 10/16/18 1256  Weight: 73 kg 70.8 kg    ROS: Review of Systems  Constitutional: Negative for chills and fever.  Eyes: Negative for blurred vision.  Respiratory: Negative for cough and shortness of breath.   Cardiovascular: Negative for chest pain.  Gastrointestinal: Negative for abdominal pain, constipation, diarrhea, nausea and vomiting.  Genitourinary: Negative for dysuria.  Musculoskeletal: Negative for joint pain.  Neurological: Negative for dizziness and headaches.   Exam: Physical Exam  HENT:  Nose: No mucosal edema.  Mouth/Throat: No oropharyngeal exudate or posterior oropharyngeal edema.  Eyes: Pupils are equal, round, and reactive to light. Conjunctivae, EOM and lids are normal.  Neck: No JVD present. Carotid bruit is not present. No edema present. No thyroid mass and no thyromegaly present.  Cardiovascular: S1 normal and S2 normal. Exam reveals no gallop.  No murmur heard. Pulses:      Dorsalis pedis pulses are 2+ on the right side, and 2+ on the left side.  Respiratory: No respiratory distress. He has no wheezes. He has no rhonchi. He has no rales.  GI: Soft. Bowel sounds are normal. There is no tenderness.  Musculoskeletal:       Right ankle: He exhibits no swelling.        Left ankle: He exhibits no swelling.  Lymphadenopathy:    He has no cervical adenopathy.  Neurological: He is alert.  Moves all of his extremities on his own  Skin: Skin is warm. Nails show no clubbing.  Dried skin rash left chest  Psychiatric:  Able to answer some yes or no questions but does not elaborate very much      Data Reviewed: Basic Metabolic Panel: Recent Labs  Lab 10/14/18 0013 10/15/18 1608 10/16/18 0300  NA 130* 128* 132*  K 4.1 4.1 3.7  CL 90* 86* 93*  CO2 23 26 25   GLUCOSE 158* 107* 142*  BUN 46* 51* 52*  CREATININE 10.10* 10.53* 10.50*  CALCIUM 9.0 8.0* 8.1*   Liver Function Tests: Recent Labs  Lab 10/14/18 0013 10/15/18 1608  AST 18 17  ALT 21 18  ALKPHOS 92 85  BILITOT 0.8 0.8  PROT 7.2 6.9  ALBUMIN 4.5 4.2    Recent Labs  Lab 10/14/18 0013 10/15/18 1859  AMMONIA 21 14   CBC: Recent Labs  Lab 10/14/18 0013 10/15/18 1608  WBC 9.9 8.8  NEUTROABS 8.2*  --   HGB 11.8* 11.9*  HCT 35.6* 34.9*  MCV 95.2 94.6  PLT 260 252    Scheduled Meds: . benazepril  20 mg Oral Daily  . calcium acetate  1,334 mg Oral TID WC  . cinacalcet  90 mg Oral BID WC  . gentamicin cream  1 application Topical Daily  . heparin  5,000 Units Subcutaneous Q8H  . metoprolol succinate  50 mg Oral Daily  . multivitamin with minerals   Oral Daily  . sodium chloride flush  3 mL Intravenous Q12H  . sodium chloride  2 g Oral TID WC  . torsemide  100 mg Oral Daily   Continuous Infusions: . sodium chloride    . dialysis solution 1.5% low-MG/low-CA      Assessment/Plan:  1. Acute encephalopathy likely secondary to Valtrex.  In dialysis patients this medication needs to be dosed 500 mg every 48 hours rather than 500 mg 3 times daily.  This will likely take a few days to get out of the system. 2. End-stage renal disease on peritoneal dialysis 3. Hypertension on benazepril metoprolol and torsemide 4. Hyponatremia dialysis to manage 5. History of  gout 6. Shingles left chest.  Has enough Valtrex and him that we do not need to dose this anymore.  Code Status:     Code Status Orders  (From admission, onward)         Start     Ordered   10/15/18 1845  Full code  Continuous     10/15/18 1844        Code Status History    Date Active Date Inactive Code Status Order ID Comments User Context   05/03/2018 1749 05/09/2018 1842 Full Code [id number]  Loletha Grayer, MD ED     Family Communication: Family at the bedside Disposition Plan: Likely will need to be in the hospital a few days  Consultants:  Nephrology  Neurology  Time spent: 28 minutes  Matthew Mathis

## 2018-10-16 NOTE — Progress Notes (Signed)
PD initiated

## 2018-10-16 NOTE — Progress Notes (Signed)
[facility]  ROUNDING NOTE   Subjective:  Patient seen at bedside. Still quite confused. Completed peritoneal dialysis overnight.   Objective:  Vital signs in last 24 hours:  Temp:  [97.3 F (36.3 C)-98.6 F (37 C)] 98.6 F (37 C) (11/12 0803) Pulse Rate:  [73-85] 74 (11/12 0803) Resp:  [18-20] 18 (11/12 0607) BP: (120-176)/(77-96) 163/88 (11/12 0803) SpO2:  [93 %-99 %] 99 % (11/12 0803) Weight:  [73 kg] 73 kg (11/11 1555)  Weight change:  Filed Weights   10/15/18 1555  Weight: 73 kg    Intake/Output: I/O last 3 completed shifts: In: 8000 [Other:8000] Out: 200 [Urine:200]   Intake/Output this shift:  Total I/O In: 3 [I.V.:3] Out: 773 [Other:773]  Physical Exam: General: No acute distress  Head: Normocephalic, atraumatic. Moist oral mucosal membranes  Eyes: Anicteric  Neck: Supple, trachea midline  Lungs:  Clear to auscultation, normal effort  Heart: S1S2 no rubs  Abdomen:  Soft, nontender, bowel sounds present  Extremities: No peripheral edema.  Neurologic: Awake, alert, following commands, but confused  Skin: Healing zoster lesions on chest  Access: PD catheter in place    Basic Metabolic Panel: Recent Labs  Lab 10/14/18 0013 10/15/18 1608 10/16/18 0300  NA 130* 128* 132*  K 4.1 4.1 3.7  CL 90* 86* 93*  CO2 23 26 25   GLUCOSE 158* 107* 142*  BUN 46* 51* 52*  CREATININE 10.10* 10.53* 10.50*  CALCIUM 9.0 8.0* 8.1*    Liver Function Tests: Recent Labs  Lab 10/14/18 0013 10/15/18 1608  AST 18 17  ALT 21 18  ALKPHOS 92 85  BILITOT 0.8 0.8  PROT 7.2 6.9  ALBUMIN 4.5 4.2   No results for input(s): LIPASE, AMYLASE in the last 168 hours. Recent Labs  Lab 10/14/18 0013 10/15/18 1859  AMMONIA 21 14    CBC: Recent Labs  Lab 10/14/18 0013 10/15/18 1608  WBC 9.9 8.8  NEUTROABS 8.2*  --   HGB 11.8* 11.9*  HCT 35.6* 34.9*  MCV 95.2 94.6  PLT 260 252    Cardiac Enzymes: No results for input(s): CKTOTAL, CKMB, CKMBINDEX,  TROPONINI in the last 168 hours.  BNP: Invalid input(s): POCBNP  CBG: No results for input(s): GLUCAP in the last 168 hours.  Microbiology: Results for orders placed or performed during the hospital encounter of 05/03/18  Blood Culture (routine x 2)     Status: None   Collection Time: 05/03/18  3:48 PM  Result Value Ref Range Status   Specimen Description BLOOD LT [facility]  Final   Special Requests   Final    BOTTLES DRAWN AEROBIC AND ANAEROBIC Blood Culture adequate volume   Culture   Final    NO GROWTH 5 DAYS Performed at [facility], [address] [place], [place] [address]    Report Status 05/08/2018 FINAL  Final  Blood Culture (routine x 2)     Status: None   Collection Time: 05/03/18  3:57 PM  Result Value Ref Range Status   Specimen Description BLOOD RT [facility]  Final   Special Requests   Final    BOTTLES DRAWN AEROBIC AND ANAEROBIC Blood Culture adequate volume   Culture   Final    NO GROWTH 5 DAYS Performed at [facility], [address], [place], [place] [address]    Report Status 05/08/2018 FINAL  Final  Body fluid culture     Status: None   Collection Time: 05/03/18  4:23 PM  Result Value Ref  Range Status   Specimen Description   Final    PERITONEAL CAVITY Performed at [facility], [address] [place], [place] [address]    Special Requests   Final    Normal Performed at [facility], [address] [place], [place] [address]    Gram Stain   Final    RARE WBC PRESENT, PREDOMINANTLY PMN NO ORGANISMS SEEN    Culture   Final    NO GROWTH 3 DAYS Performed at [facility], [place] [address], [place], [place] [address]    Report Status 05/07/2018 FINAL  Final    Coagulation Studies: No results for input(s): LABPROT, INR in the last 72 hours.  Urinalysis: Recent Labs    10/15/18 2329  COLORURINE STRAW*  LABSPEC 1.009  PHURINE 8.0  GLUCOSEU 150*  HGBUR SMALL*  BILIRUBINUR NEGATIVE  KETONESUR NEGATIVE   PROTEINUR 30*  NITRITE NEGATIVE  LEUKOCYTESUR NEGATIVE      Imaging: No results found.   Medications:   . sodium chloride    . dialysis solution 1.5% low-MG/low-CA     . benazepril  20 mg Oral Daily  . calcium acetate  1,334 mg Oral TID WC  . cinacalcet  90 mg Oral BID WC  . gentamicin cream  1 application Topical Daily  . heparin  5,000 Units Subcutaneous Q8H  . metoprolol succinate  50 mg Oral Daily  . multivitamin with minerals   Oral Daily  . sodium chloride flush  3 mL Intravenous Q12H  . sodium chloride  2 g Oral TID WC  . torsemide  100 mg Oral Daily   sodium chloride, acetaminophen **OR** acetaminophen, hydrALAZINE, HYDROcodone-acetaminophen, ondansetron **OR** ondansetron (ZOFRAN) IV, polyethylene glycol, sodium chloride flush, traZODone, triamcinolone cream  Assessment/ Plan:  57 y.o. male with ESRD on PD, anemia of chronic kidney disease, secondary hyperparathyroidism, gout, history of small bowel obstruction, herpes zoster infection   1.  ESRD on peritoneal dialysis.  Patient tolerating peritoneal dialysis well.  Continue current PD prescription.  2.  Altered mental status.  Suspect that this is secondary to a combination of valacyclovir and prednisone however more likely valacyclovir as we have seen similar cases.  He was on valacyclovir 500 mg p.o. every 8 hours.  We have consulted neurology as well given the persistent altered mental status.  3.  Anemia of chronic kidney disease.  Continue to periodically monitor CBC.  4.  Secondary hyperparathyroidism.  Continue calcium acetate 2 tablets by mouth 3 times a day with meals.   LOS: 1 Shaneca Orne 11/12/201912:54 PM

## 2018-10-16 NOTE — Progress Notes (Signed)
To EEG via bed.

## 2018-10-16 NOTE — Plan of Care (Signed)
  Problem: Clinical Measurements: Goal: Will remain free from infection Outcome: Progressing Note:  Remains afebrile Goal: Cardiovascular complication will be avoided Outcome: Progressing Note:  No arrhythmias today   Problem: Clinical Measurements: Goal: Diagnostic test results will improve Outcome: Not Progressing Note:  NA 132 BUN 52/10.5

## 2018-10-17 DIAGNOSIS — R4182 Altered mental status, unspecified: Secondary | ICD-10-CM

## 2018-10-17 LAB — RAPID HIV SCREEN (HIV 1/2 AB+AG)
HIV 1/2 Antibodies: NONREACTIVE
HIV-1 P24 Antigen - HIV24: NONREACTIVE

## 2018-10-17 LAB — VITAMIN B12: Vitamin B-12: 788 pg/mL (ref 180–914)

## 2018-10-17 LAB — AMMONIA: Ammonia: 21 umol/L (ref 9–35)

## 2018-10-17 LAB — FOLATE: FOLATE: 44 ng/mL (ref >5.9)

## 2018-10-17 LAB — TSH: TSH: 0.175 u[IU]/mL — ABNORMAL LOW (ref 0.350–4.500)

## 2018-10-17 MED ORDER — TRAZODONE HCL 50 MG PO TABS: 50.0000 mg | ORAL_TABLET | Freq: Every day | ORAL | Status: DC

## 2018-10-17 MED ORDER — HALOPERIDOL LACTATE 5 MG/ML IJ SOLN
1.0000 mg | Freq: Four times a day (QID) | INTRAMUSCULAR | Status: DC | PRN
  Administered 2018-10-17: 1 mg via INTRAVENOUS
  Filled 2018-10-17: qty 1

## 2018-10-17 MED ORDER — DEXTROSE 5 % IV SOLN
5.0000 mg/kg | INTRAVENOUS | Status: DC
  Filled 2018-10-17: qty 6.9

## 2018-10-17 MED ORDER — DEXTROSE-NACL 5-0.9 % IV SOLN
INTRAVENOUS | Status: DC
  Administered 2018-10-17 – 2018-10-18 (×2): via INTRAVENOUS

## 2018-10-17 MED ORDER — LORAZEPAM 2 MG/ML IJ SOLN
1.0000 mg | Freq: Once | INTRAMUSCULAR | Status: AC
  Administered 2018-10-17: 1 mg via INTRAVENOUS
  Filled 2018-10-17: qty 1

## 2018-10-17 MED ORDER — LORAZEPAM 2 MG/ML IJ SOLN
1.0000 mg | Freq: Once | INTRAMUSCULAR | Status: AC | PRN
  Administered 2018-10-18: 1 mg via INTRAVENOUS
  Filled 2018-10-17: qty 1

## 2018-10-17 MED ORDER — OLANZAPINE 10 MG IM SOLR
5.0000 mg | Freq: Two times a day (BID) | INTRAMUSCULAR | Status: DC
  Administered 2018-10-17: 5 mg via INTRAMUSCULAR
  Filled 2018-10-17 (×2): qty 10

## 2018-10-17 NOTE — Progress Notes (Addendum)
Subjective: Patient remains confused with periods of intermittent agitation and combativeness towards staff requiring medical intervention with Ativan, Zyprexa and Haldol.  Objective: Current vital signs: BP (!) 149/100 (BP Location: Left Arm)   Pulse 84   Temp 97.7 F (36.5 C) (Oral)   Resp 18   Ht 5\' 7"  (1.702 m)   Wt 69 kg   SpO2 100%   BMI 23.82 kg/m  Vital signs in last 24 hours: Temp:  [97.5 F (36.4 C)-98 F (36.7 C)] 97.7 F (36.5 C) (11/13 0821) Pulse Rate:  [71-84] 84 (11/13 0816) Resp:  [18] 18 (11/13 0451) BP: (148-153)/(90-100) 149/100 (11/13 0816) SpO2:  [98 %-100 %] 100 % (11/13 0816) Weight:  [69 kg-70.8 kg] 69 kg (11/12 2000)  Intake/Output from previous day: 11/12 0701 - 11/13 0700 In: 243 [P.O.:240; I.V.:3] Out: 773  Intake/Output this shift: Total I/O In: -  Out: 724 [Other:724] Nutritional status:  Diet Order            Diet renal with fluid restriction Fluid restriction: 1200 mL Fluid; Room service appropriate? Yes; Fluid consistency: Thin  Diet effective now             Neurologic Exam:   Mental Status: Alert  confused. Opens eyes briefly to verbal stimuli.  Difficulty following commands may be due to language barrier.  Attempts made in native language with help of family member but still requires significant reinforcement and prompting. Mumbles incoherently. Cranial Nerves: II: Blinks to bilateral confrontation III,IV, VI: ptosis not present, extra-ocular motions intact bilaterally V,VII: Unable to assess as patient is confused and not co-operating VIII: hearing normal bilaterally IX,X: gag reflex deffered XI: Unable to assess XII: midline tongue extension Motor: Moves all extremities against gravity and able to maintain. Sensory: Appreciates stimulation in all extremities Deep Tendon Reflexes: 2+ in the upper extremities and absent in the lower extremities Plantars: Right: mute                              Left:  downgoing Cerebellar: Unable to perform Gait: not tested due to safety concerns   Lab Results: Basic Metabolic Panel: Recent Labs  Lab 10/14/18 0013 10/15/18 1608 10/16/18 0300  NA 130* 128* 132*  K 4.1 4.1 3.7  CL 90* 86* 93*  CO2 23 26 25   GLUCOSE 158* 107* 142*  BUN 46* 51* 52*  CREATININE 10.10* 10.53* 10.50*  CALCIUM 9.0 8.0* 8.1*    Liver Function Tests: Recent Labs  Lab 10/14/18 0013 10/15/18 1608  AST 18 17  ALT 21 18  ALKPHOS 92 85  BILITOT 0.8 0.8  PROT 7.2 6.9  ALBUMIN 4.5 4.2   No results for input(s): LIPASE, AMYLASE in the last 168 hours. Recent Labs  Lab 10/14/18 0013 10/15/18 1859 10/17/18 0731  AMMONIA 21 14 21     CBC: Recent Labs  Lab 10/14/18 0013 10/15/18 1608  WBC 9.9 8.8  NEUTROABS 8.2*  --   HGB 11.8* 11.9*  HCT 35.6* 34.9*  MCV 95.2 94.6  PLT 260 252    Cardiac Enzymes: No results for input(s): CKTOTAL, CKMB, CKMBINDEX, TROPONINI in the last 168 hours.  Lipid Panel: No results for input(s): CHOL, TRIG, HDL, CHOLHDL, VLDL, LDLCALC in the last 168 hours.  CBG: No results for input(s): GLUCAP in the last 168 hours.  Microbiology: Results for orders placed or performed during the hospital encounter of 05/03/18  Blood Culture (routine x 2)  Status: None   Collection Time: 05/03/18  3:48 PM  Result Value Ref Range Status   Specimen Description BLOOD LT [facility]  Final   Special Requests   Final    BOTTLES DRAWN AEROBIC AND ANAEROBIC Blood Culture adequate volume   Culture   Final    NO GROWTH 5 DAYS Performed at [facility], [address], [place], [place] [address]    Report Status 05/08/2018 FINAL  Final  Blood Culture (routine x 2)     Status: None   Collection Time: 05/03/18  3:57 PM  Result Value Ref Range Status   Specimen Description BLOOD RT [facility]  Final   Special Requests   Final    BOTTLES DRAWN AEROBIC AND ANAEROBIC Blood Culture adequate volume   Culture   Final    NO GROWTH 5 DAYS Performed at  [facility], [address], [place], [place] [address]    Report Status 05/08/2018 FINAL  Final  Body fluid culture     Status: None   Collection Time: 05/03/18  4:23 PM  Result Value Ref Range Status   Specimen Description   Final    PERITONEAL CAVITY Performed at [facility], [address], [place], [place] [address]    Special Requests   Final    Normal Performed at [facility], [address] [place], [place] [address]    Gram Stain   Final    RARE WBC PRESENT, PREDOMINANTLY PMN NO ORGANISMS SEEN    Culture   Final    NO GROWTH 3 DAYS Performed at [facility], [place] [address] [place], [place] [address]    Report Status 05/07/2018 FINAL  Final    Coagulation Studies: No results for input(s): LABPROT, INR in the last 72 hours.  Imaging: No results found.  Medications:  I have reviewed the patient's current medications. Prior to Admission:  Medications Prior to Admission  Medication Sig Dispense Refill Last Dose  . allopurinol (ZYLOPRIM) 100 MG tablet Take 100 mg by mouth every evening.    10/13/2018 at pm  . benazepril (LOTENSIN) 20 MG tablet Take 20 mg by mouth daily.  11 10/13/2018 at am  . calcium acetate (PHOSLO) 667 MG capsule Take 667-1,334 mg by mouth See admin instructions. Take 1334 mg by mouth three times a day and take 667 mg by mouth with snacks.   10/13/2018 at pm  . cinacalcet (SENSIPAR) 60 MG tablet Take 90 mg by mouth 2 (two) times daily with a meal.    10/13/2018 at pm  . ketoconazole (NIZORAL) 2 % shampoo Apply 1 application topically 3 (three) times a week.   5 Past Week at Unknown time  . metoprolol succinate (TOPROL-XL) 50 MG 24 hr tablet Take 1 tablet (50 mg total) by mouth daily. Take with or immediately following a meal. 30 tablet 0 10/13/2018 at unknown  . Multiple Vitamins-Minerals (DIALYVITE 800/ULTRA D) TABS Take 1 tablet by mouth daily.  3 10/13/2018 at am  . torsemide (DEMADEX) 100 MG tablet Take 100 mg by  mouth daily.  12 10/13/2018 at am  . triamcinolone cream (KENALOG) 0.1 % Apply 1 application topically 2 (two) times daily as needed (for inflammation).    unknown at unknown  . valACYclovir (VALTREX) 1000 MG tablet Take 500 mg by mouth 3 (three) times daily.    10/13/2018 at pm  . sevelamer carbonate (RENVELA) 800 MG tablet Take 2 tablets (1,600 mg total) by mouth 3 (three)  times daily with meals. (Patient not taking: Reported on 07/17/2018) 90 tablet 0 Not Taking at Unknown time   Scheduled: . benazepril  20 mg Oral Daily  . calcium acetate  1,334 mg Oral TID WC  . cinacalcet  90 mg Oral BID WC  . gentamicin cream  1 application Topical Daily  . heparin  5,000 Units Subcutaneous Q8H  . metoprolol succinate  50 mg Oral Daily  . multivitamin with minerals   Oral Daily  . sodium chloride flush  3 mL Intravenous Q12H  . torsemide  100 mg Oral Daily  . traZODone  50 mg Oral QHS   Patient seen and examined.  Clinical course and management discussed.  Necessary edits performed.  I agree with the above.  Assessment and plan of care developed and discussed below.    Assessment: 57 y.o male with pertinent history of end-stage renal disease on peritoneal dialysis, anemia of chronic kidney disease, and hypertension presenting with altered mental status likely due to urtication side effect acyclovir and possible metabolic encephalopathy.  She was started on acyclovir on November 8 for shingles of the left chest wall T4 dermatome distribution.  Acyclovir currently on hold pending further work-up.  EEG abnormal secondary to general background slowing otherwise no epileptiform discharges noted.  Ammonia level normal 21 umol/L  Plan: 1.  MRI brain without contrast pending.  Unable to be performed on yesterday due to agitation.  Will re-attempt today with sedation.  If unremarkable will consider LP 2.  Labs: RPR, folate, vitamin B12, TSH ending 3.  Agree with current medical management.  This patient was  staffed with Dr. Magda Paganini, Doy Mince who personally evaluated patient, reviewed documentation and agreed with assessment and plan of care as above.  Rufina Falco, DNP, FNP-BC Board certified Nurse Practitioner Neurology Department   LOS: 2 days   10/17/2018  9:15 AM  Alexis Goodell, MD Neurology [phone]  10/17/2018  12:47 PM

## 2018-10-17 NOTE — Plan of Care (Signed)
  Problem: Safety: Goal: Ability to remain free from injury will improve Outcome: Progressing   Problem: Fluid Volume: Goal: Compliance with measures to maintain balanced fluid volume will improve Outcome: Progressing   Problem: Nutritional: Goal: Ability to make healthy dietary choices will improve Outcome: Progressing   Problem: Clinical Measurements: Goal: Complications related to the disease process, condition or treatment will be avoided or minimized Outcome: Progressing   Problem: Coping: Goal: Level of anxiety will decrease Outcome: Not Progressing Patient continues to become more agitated and confused.

## 2018-10-17 NOTE — Plan of Care (Signed)
  Problem: Education: Goal: Knowledge of General Education information will improve Description Including pain rating scale, medication(s)/side effects and non-pharmacologic comfort measures Outcome: Not Progressing Note:  Patient acutely delirious. Wasn't able to administer any PO medications today, most of which have been d/c'd. Patient to remain NPO (strictly) for remainder of day and night shift tonight. S.L.P. to re-evaluate in the morning. Will continue to monitor neurological status. Wenda Low [facility]

## 2018-10-17 NOTE — Care Management (Signed)
Altered mental statu. MRI pending.  EEG performed. Valacyclovir being used to treat shingles is currently on hold. It is felt the confusion could be related to this drug. ESRD on peritoneal dialysis. Matthew Mathis with [employer] aware of admission

## 2018-10-17 NOTE — Progress Notes (Signed)
Pt. Is getting even more agitated and combative towards the staff and wife. Called Dr to get new orders for his agitation and confusion. Ordered Ativan for patient.

## 2018-10-17 NOTE — Progress Notes (Signed)
Pt. Became more confused and agitated when doing Q4 hr neuro checks. Called Dr. Duane Boston and put in new order for zyprexa IM 5mg  and a telesitter as well.

## 2018-10-17 NOTE — Progress Notes (Signed)
Patient ID: Matthew Mathis, male   DOB: June 11, 1961, 57 y.o.   MRN: [id number]  [employer] PROGRESS NOTE  Matthew Mathis NOI:[id number] DOB: 30-Jun-1961 DOA: 10/15/2018 PCP: [facility]  HPI/Subjective: Patient worsened overnight.  I saw him early this morning and he was only able to answer few questions.  He was thrashing around in the bed.  Spoke with the nursing staff and wanted him to move closer to the nursing station for closer monitoring.  Did have a telemetry sitter in the room but may actually need a sitter.  Objective: Vitals:   10/17/18 0816 10/17/18 0821  BP: (!) 149/100   Pulse: 84   Resp:    Temp:  97.7 F (36.5 C)  SpO2: 100%     Filed Weights   10/15/18 1555 10/16/18 1256 10/16/18 2000  Weight: 73 kg 70.8 kg 69 kg    ROS: Review of Systems  Unable to perform ROS: Acuity of condition  Respiratory: Negative for shortness of breath.   Cardiovascular: Negative for chest pain.   Exam: Physical Exam  HENT:  Nose: No mucosal edema.  Mouth/Throat: No oropharyngeal exudate or posterior oropharyngeal edema.  Eyes: Pupils are equal, round, and reactive to light. Conjunctivae, EOM and lids are normal.  Neck: No JVD present. Carotid bruit is not present. No edema present. No thyroid mass and no thyromegaly present.  Cardiovascular: S1 normal and S2 normal. Exam reveals no gallop.  No murmur heard. Pulses:      Dorsalis pedis pulses are 2+ on the right side, and 2+ on the left side.  Respiratory: No respiratory distress. He has no wheezes. He has no rhonchi. He has no rales.  GI: Soft. Bowel sounds are normal. There is no tenderness.  Musculoskeletal:       Right ankle: He exhibits no swelling.       Left ankle: He exhibits no swelling.  Lymphadenopathy:    He has no cervical adenopathy.  Neurological:  Agitated  Skin: Skin is warm. Nails show no clubbing.  Dried skin rash left chest  Psychiatric:  More agitated today      Data Reviewed: Basic  Metabolic Panel: Recent Labs  Lab 10/14/18 0013 10/15/18 1608 10/16/18 0300  NA 130* 128* 132*  K 4.1 4.1 3.7  CL 90* 86* 93*  CO2 23 26 25   GLUCOSE 158* 107* 142*  BUN 46* 51* 52*  CREATININE 10.10* 10.53* 10.50*  CALCIUM 9.0 8.0* 8.1*   Liver Function Tests: Recent Labs  Lab 10/14/18 0013 10/15/18 1608  AST 18 17  ALT 21 18  ALKPHOS 92 85  BILITOT 0.8 0.8  PROT 7.2 6.9  ALBUMIN 4.5 4.2    Recent Labs  Lab 10/14/18 0013 10/15/18 1859 10/17/18 0731  AMMONIA 21 14 21    CBC: Recent Labs  Lab 10/14/18 0013 10/15/18 1608  WBC 9.9 8.8  NEUTROABS 8.2*  --   HGB 11.8* 11.9*  HCT 35.6* 34.9*  MCV 95.2 94.6  PLT 260 252    Scheduled Meds: . benazepril  20 mg Oral Daily  . calcium acetate  1,334 mg Oral TID WC  . cinacalcet  90 mg Oral BID WC  . gentamicin cream  1 application Topical Daily  . heparin  5,000 Units Subcutaneous Q8H  . metoprolol succinate  50 mg Oral Daily  . multivitamin with minerals   Oral Daily  . sodium chloride flush  3 mL Intravenous Q12H  . torsemide  100 mg Oral Daily  . traZODone  50 mg Oral QHS   Continuous Infusions: . sodium chloride    . dextrose 5 % and 0.9% NaCl    . dialysis solution 1.5% low-MG/low-CA 8,000 mL (10/16/18 2015)    Assessment/Plan:  1. Acute encephalopathy likely secondary to Valtrex.  In dialysis patients this medication needs to be dosed 500 mg every 48 hours rather than 500 mg 3 times daily.  This will likely take much longer to get out of his system.  Try to sedate to get into the MRI machine.  Neurology following.  Depending on MRI results may need a lumbar puncture.  Patient made n.p.o. 2. End-stage renal disease on peritoneal dialysis 3. Hypertension.  Unable to take oral medications 4. Hyponatremia dialysis to manage 5. History of gout 6. Shingles left chest.  Has enough Valtrex and him that we do not need to dose this anymore.  Code Status:     Code Status Orders  (From admission, onward)          Start     Ordered   10/15/18 1845  Full code  Continuous     10/15/18 1844        Code Status History    Date Active Date Inactive Code Status Order ID Comments User Context   05/03/2018 1749 05/09/2018 1842 Full Code [id number]  Matthew Grayer, MD ED     Family Communication: Family at the bedside Disposition Plan: Unclear at this time  Consultants:  Nephrology  Neurology  Time spent: 28 minutes  [place]

## 2018-10-17 NOTE — Progress Notes (Signed)
Continue to used the interpretor to communicate with patient.

## 2018-10-17 NOTE — Progress Notes (Signed)
MRI called to take patient, but patient has his peritoneal dialysis running overnight. Told MRI tech that PD will be done in the morning around 8 am per PD nurse.

## 2018-10-17 NOTE — Care Management (Signed)
Increased agitation and impulsiveness.  Moved to room closer to nursing station and sitter.

## 2018-10-17 NOTE — Progress Notes (Signed)
Called MRI and asked about the status of patient's order, patient has routine order will try to be done tonight. RN will continue to monitor.

## 2018-10-17 NOTE — Progress Notes (Signed)
[facility]  ROUNDING NOTE   Subjective:  Patient with agitation overnight. Hands are in mitt restraints. Wife at bedside. Tolerated PD well.    Objective:  Vital signs in last 24 hours:  Temp:  [97.5 F (36.4 C)-98 F (36.7 C)] 97.7 F (36.5 C) (11/13 0821) Pulse Rate:  [71-84] 84 (11/13 0816) Resp:  [18] 18 (11/13 0451) BP: (148-153)/(90-100) 149/100 (11/13 0816) SpO2:  [98 %-100 %] 100 % (11/13 0816) Weight:  [69 kg] 69 kg (11/12 2000)  Weight change: -2.239 kg Filed Weights   10/15/18 1555 10/16/18 1256 10/16/18 2000  Weight: 73 kg 70.8 kg 69 kg    Intake/Output: I/O last 3 completed shifts: In: 8243 [P.O.:240; I.V.:3; Other:8000] Out: 973 [Urine:200; Other:773]   Intake/Output this shift:  Total I/O In: -  Out: 724 [Other:724]  Physical Exam: General: No acute distress  Head: Normocephalic, atraumatic. Moist oral mucosal membranes  Eyes: Anicteric  Neck: Supple, trachea midline  Lungs:  Clear to auscultation, normal effort  Heart: S1S2 no rubs  Abdomen:  Soft, nontender, bowel sounds present  Extremities: No peripheral edema.  Neurologic: Lethargic but arousable  Skin: Healing zoster lesions on chest  Access: PD catheter in place    Basic Metabolic Panel: Recent Labs  Lab 10/14/18 0013 10/15/18 1608 10/16/18 0300  NA 130* 128* 132*  K 4.1 4.1 3.7  CL 90* 86* 93*  CO2 23 26 25   GLUCOSE 158* 107* 142*  BUN 46* 51* 52*  CREATININE 10.10* 10.53* 10.50*  CALCIUM 9.0 8.0* 8.1*    Liver Function Tests: Recent Labs  Lab 10/14/18 0013 10/15/18 1608  AST 18 17  ALT 21 18  ALKPHOS 92 85  BILITOT 0.8 0.8  PROT 7.2 6.9  ALBUMIN 4.5 4.2   No results for input(s): LIPASE, AMYLASE in the last 168 hours. Recent Labs  Lab 10/14/18 0013 10/15/18 1859 10/17/18 0731  AMMONIA 21 14 21     CBC: Recent Labs  Lab 10/14/18 0013 10/15/18 1608  WBC 9.9 8.8  NEUTROABS 8.2*  --   HGB 11.8* 11.9*  HCT 35.6* 34.9*  MCV 95.2 94.6   PLT 260 252    Cardiac Enzymes: No results for input(s): CKTOTAL, CKMB, CKMBINDEX, TROPONINI in the last 168 hours.  BNP: Invalid input(s): POCBNP  CBG: No results for input(s): GLUCAP in the last 168 hours.  Microbiology: Results for orders placed or performed during the hospital encounter of 05/03/18  Blood Culture (routine x 2)     Status: None   Collection Time: 05/03/18  3:48 PM  Result Value Ref Range Status   Specimen Description BLOOD LT [facility]  Final   Special Requests   Final    BOTTLES DRAWN AEROBIC AND ANAEROBIC Blood Culture adequate volume   Culture   Final    NO GROWTH 5 DAYS Performed at [facility], [address], [place], [place] [address]    Report Status 05/08/2018 FINAL  Final  Blood Culture (routine x 2)     Status: None   Collection Time: 05/03/18  3:57 PM  Result Value Ref Range Status   Specimen Description BLOOD RT [facility]  Final   Special Requests   Final    BOTTLES DRAWN AEROBIC AND ANAEROBIC Blood Culture adequate volume   Culture   Final    NO GROWTH 5 DAYS Performed at [facility], [address] [place],  [address]    Report Status 05/08/2018 FINAL  Final  Body fluid culture  Status: None   Collection Time: 05/03/18  4:23 PM  Result Value Ref Range Status   Specimen Description   Final    PERITONEAL CAVITY Performed at [facility], [address] [place], [place] [address]    Special Requests   Final    Normal Performed at [facility], [address] [place], [place] [address]    Gram Stain   Final    RARE WBC PRESENT, PREDOMINANTLY PMN NO ORGANISMS SEEN    Culture   Final    NO GROWTH 3 DAYS Performed at [facility], [address] [place], [place] [address]    Report Status 05/07/2018 FINAL  Final    Coagulation Studies: No results for input(s): LABPROT, INR in the last 72 hours.  Urinalysis: Recent Labs    10/15/18 2329  COLORURINE STRAW*  LABSPEC 1.009   PHURINE 8.0  GLUCOSEU 150*  HGBUR SMALL*  BILIRUBINUR NEGATIVE  KETONESUR NEGATIVE  PROTEINUR 30*  NITRITE NEGATIVE  LEUKOCYTESUR NEGATIVE      Imaging: No results found.   Medications:   . sodium chloride    . dextrose 5 % and 0.9% NaCl    . dialysis solution 1.5% low-MG/low-CA 8,000 mL (10/16/18 2015)   . benazepril  20 mg Oral Daily  . calcium acetate  1,334 mg Oral TID WC  . cinacalcet  90 mg Oral BID WC  . gentamicin cream  1 application Topical Daily  . heparin  5,000 Units Subcutaneous Q8H  . metoprolol succinate  50 mg Oral Daily  . multivitamin with minerals   Oral Daily  . sodium chloride flush  3 mL Intravenous Q12H  . torsemide  100 mg Oral Daily  . traZODone  50 mg Oral QHS   sodium chloride, acetaminophen **OR** acetaminophen, haloperidol lactate, hydrALAZINE, HYDROcodone-acetaminophen, LORazepam, ondansetron **OR** ondansetron (ZOFRAN) IV, polyethylene glycol, sodium chloride flush, triamcinolone cream  Assessment/ Plan:  57 y.o. male with ESRD on PD, anemia of chronic kidney disease, secondary hyperparathyroidism, gout, history of small bowel obstruction, herpes zoster infection   1.  ESRD on peritoneal dialysis.  Patient continues to tolerate peritoneal dialysis well.  We will plan for additional peritoneal dialysis tonight.  Since the patient is not eating we will start the patient on D5 normal saline.  2.  Altered mental status.  Suspect that this is secondary to a combination of valacyclovir and prednisone however more likely valacyclovir as we have seen similar cases.  May have superimposed delirium now as well.  Agitation noted overnight.  Hands are in mid restraints at the moment.  Neurology has been consulted.  They are considering lumbar puncture.  Imaging is also been ordered.  3.  Anemia of chronic kidney disease.  Continue to periodically monitor CBC.  4.  Secondary hyperparathyroidism.  Currently n.p.o.   LOS: 2 Ronnica Dreese 11/13/20191:38 PM

## 2018-10-17 NOTE — Progress Notes (Signed)
[facility] Tx started w/o complication, exit site assessemnt completed, no s/s of infection, cleaned and dressing changed.    10/17/18 2019  Peritoneal Catheter Right lower abdomen Continuous ambulatory  Placement Date/Time: 05/24/17 0920   Procedural Verification: Medical records & consent reviewed;Site marked with initials  Time out: Correct Patient;Correct Site;Correct Procedure;Special equipment/requirements available  Person Inserting Catheter: D...  Site Assessment Clean;Dry;Intact  Drainage Description None  Catheter status Accessed  Dressing Gauze/Drain sponge  Dressing Status Clean;Dry;Intact  Dressing Intervention Dressing changed  Cycler Setup  Total Number of Exchanges 4  Fill Volume 2000  Dianeal Solution Dextrose 1.5% in 6000 mL  Last Fill Volume 0  Fill Time - Minute(s) 10  Dwell Time - Hour(s) 1  Dwell Time - Minute(s) 34  Drain Time - Minute(s) 20 mins  Completion  Treatment Status Started  Fluid Balance - CCPD  Total Intake for Exchanges (mL) 8000 ml  Education / Care Plan  Dialysis Education Provided No (Comment) (Altered Mental Status )  Hand-Off documentation  Report given to (Full Name) Beatris Ship, RN   Report received from (Full Name) Aniceto Boss, RN

## 2018-10-17 NOTE — Progress Notes (Signed)
SLP Cancellation Note  Patient Details Name: Matthew Mathis MRN: [id number] DOB: 1961/08/12   Cancelled treatment:       Reason Eval/Treat Not Completed: Patient not medically ready;Fatigue/lethargy limiting ability to participate(chart reviewed; consulted NSG then assessed pt).  Pt remained lethargic and did not open eyes or verbally respond to max verbal/tactile stim. Noted significant U/LE involuntary movements at rest then w/ any stimulation, pt raised legs bilaterally up in the air. ALso noted Apnea appearing moments of ~30 secs each while in room.  No BSE performed today d/t increased risk for aspiration w/ any po intake secondary to pt's unresponsiveness and agitation. ST services will f/u tomorrow for ongoing assessment. Recommend oral care when able for hygiene and stimulation of swallowing. Sitter present in room. NSG updated.     Orinda Kenner, MS, CCC-SLP Watson,Katherine 10/17/2018, 11:37 AM

## 2018-10-18 ENCOUNTER — Inpatient Hospital Stay: Payer: Medicare Other

## 2018-10-18 LAB — RPR
RPR: NONREACTIVE
RPR: NONREACTIVE

## 2018-10-18 NOTE — Progress Notes (Signed)
Subjective: This AM patient much less agitated.  Sleeping but easily aroused and able to communicate in [place].  Objective: Current vital signs: BP 126/65 (BP Location: Left Arm)   Pulse 89   Temp 98.4 F (36.9 C) (Oral)   Resp 18   Ht 5\' 7"  (1.702 m)   Wt 73.6 kg   SpO2 96%   BMI 25.41 kg/m  Vital signs in last 24 hours: Temp:  [97.8 F (36.6 C)-98.4 F (36.9 C)] 98.4 F (36.9 C) (11/14 0750) Pulse Rate:  [83-89] 89 (11/14 0750) Resp:  [16-18] 18 (11/14 0750) BP: (126-140)/(65-77) 126/65 (11/14 0750) SpO2:  [96 %-98 %] 96 % (11/14 0750) Weight:  [73.6 kg] 73.6 kg (11/14 0454)  Intake/Output from previous day: 11/13 0701 - 11/14 0700 In: 8000  Out: 924 [Urine:200] Intake/Output this shift: No intake/output data recorded. Nutritional status:  Diet Order            Diet NPO time specified  Diet effective now              Neurologic Exam: Mental Status: Lethargic.  Able to be aroused.  Speech fluent.  Able to speak and understand English.  Follows simple commands.  Able to recognize wife in the room.   Cranial Nerves: VP:XTGGYI to bilateral confrontation III,IV, VI: ptosis not present, extra-ocular motions intact bilaterally V,VII: Smile symmetric VIII: hearing normal bilaterally IX,X: gag reflex deffered XI: Unable to assess XII: midline tongue extension Motor: Moves all extremities spontaneously against gravity.   Lab Results: Basic Metabolic Panel: Recent Labs  Lab 10/14/18 0013 10/15/18 1608 10/16/18 0300  NA 130* 128* 132*  K 4.1 4.1 3.7  CL 90* 86* 93*  CO2 23 26 25   GLUCOSE 158* 107* 142*  BUN 46* 51* 52*  CREATININE 10.10* 10.53* 10.50*  CALCIUM 9.0 8.0* 8.1*    Liver Function Tests: Recent Labs  Lab 10/14/18 0013 10/15/18 1608  AST 18 17  ALT 21 18  ALKPHOS 92 85  BILITOT 0.8 0.8  PROT 7.2 6.9  ALBUMIN 4.5 4.2   No results for input(s): LIPASE, AMYLASE in the last 168 hours. Recent Labs  Lab 10/14/18 0013 10/15/18 1859  10/17/18 0731  AMMONIA 21 14 21     CBC: Recent Labs  Lab 10/14/18 0013 10/15/18 1608  WBC 9.9 8.8  NEUTROABS 8.2*  --   HGB 11.8* 11.9*  HCT 35.6* 34.9*  MCV 95.2 94.6  PLT 260 252    Cardiac Enzymes: No results for input(s): CKTOTAL, CKMB, CKMBINDEX, TROPONINI in the last 168 hours.  Lipid Panel: No results for input(s): CHOL, TRIG, HDL, CHOLHDL, VLDL, LDLCALC in the last 168 hours.  CBG: No results for input(s): GLUCAP in the last 168 hours.  Microbiology: Results for orders placed or performed during the hospital encounter of 05/03/18  Blood Culture (routine x 2)     Status: None   Collection Time: 05/03/18  3:48 PM  Result Value Ref Range Status   Specimen Description BLOOD LT [facility]  Final   Special Requests   Final    BOTTLES DRAWN AEROBIC AND ANAEROBIC Blood Culture adequate volume   Culture   Final    NO GROWTH 5 DAYS Performed at [facility], [address] [place], [place] [address]    Report Status 05/08/2018 FINAL  Final  Blood Culture (routine x 2)     Status: None   Collection Time: 05/03/18  3:57 PM  Result Value Ref Range Status   Specimen Description  BLOOD RT [facility]  Final   Special Requests   Final    BOTTLES DRAWN AEROBIC AND ANAEROBIC Blood Culture adequate volume   Culture   Final    NO GROWTH 5 DAYS Performed at [facility], [address] [place], [place] [address]    Report Status 05/08/2018 FINAL  Final  Body fluid culture     Status: None   Collection Time: 05/03/18  4:23 PM  Result Value Ref Range Status   Specimen Description   Final    PERITONEAL CAVITY Performed at [facility], [address] [place], [place] [address]    Special Requests   Final    Normal Performed at [facility], [address] [place], [place] [address]    Gram Stain   Final    RARE WBC PRESENT, PREDOMINANTLY PMN NO ORGANISMS SEEN    Culture   Final    NO GROWTH 3 DAYS Performed at [facility], [place] [address], [place], [place] [address]    Report Status 05/07/2018 FINAL  Final    Coagulation Studies: No results for input(s): LABPROT, INR in the last 72 hours.  Imaging: No results found.  Medications:  I have reviewed the patient's current medications. Prior to Admission:  Medications Prior to Admission  Medication Sig Dispense Refill Last Dose  . allopurinol (ZYLOPRIM) 100 MG tablet Take 100 mg by mouth every evening.    10/13/2018 at pm  . benazepril (LOTENSIN) 20 MG tablet Take 20 mg by mouth daily.  11 10/13/2018 at am  . calcium acetate (PHOSLO) 667 MG capsule Take 667-1,334 mg by mouth See admin instructions. Take 1334 mg by mouth three times a day and take 667 mg by mouth with snacks.   10/13/2018 at pm  . cinacalcet (SENSIPAR) 60 MG tablet Take 90 mg by mouth 2 (two) times daily with a meal.    10/13/2018 at pm  . ketoconazole (NIZORAL) 2 % shampoo Apply 1 application topically 3 (three) times a week.   5 Past Week at Unknown time  . metoprolol succinate (TOPROL-XL) 50 MG 24 hr tablet Take 1 tablet (50 mg total) by mouth daily. Take with or immediately following a meal. 30 tablet 0 10/13/2018 at unknown  . Multiple Vitamins-Minerals (DIALYVITE 800/ULTRA D) TABS Take 1 tablet by mouth daily.  3 10/13/2018 at am  . torsemide (DEMADEX) 100 MG tablet Take 100 mg by mouth daily.  12 10/13/2018 at am  . triamcinolone cream (KENALOG) 0.1 % Apply 1 application topically 2 (two) times daily as needed (for inflammation).    unknown at unknown  . valACYclovir (VALTREX) 1000 MG tablet Take 500 mg by mouth 3 (three) times daily.    10/13/2018 at pm  . sevelamer carbonate (RENVELA) 800 MG tablet Take 2 tablets (1,600 mg total) by mouth 3 (three) times daily with meals. (Patient not taking: Reported on 07/17/2018) 90 tablet 0 Not Taking at Unknown time   Scheduled: . gentamicin cream  1 application Topical Daily  . heparin  5,000 Units Subcutaneous Q8H  . sodium chloride flush  3 mL Intravenous Q12H    Patient seen and examined.  Clinical course and management discussed.  Necessary edits performed.  I agree with the above.  Assessment and plan of care developed and discussed below.    Assessment: 57 y.o male with pertinent history of end-stage renal disease on peritoneal dialysis, anemia of chronic kidney disease, and hypertension presenting with altered mental status  likely due to metabolic/medication etiology.  Patient improved today.  EEG only significant for slowing.  Labs reveal non reactive RPR, normal folate, and vitamin B12. TSH 0.175.   Plan: 1.  MRI brain without contrast pending.  Unable to be performed on yesterday due to peritoneal dialysis running.  Will re-attempt today with sedation.  With patient improving LP not indicated at this time. 2.  Agree with current medical management and addressing of thyroid abnormalities.  This patient was staffed with Dr. Magda Paganini, Doy Mince who personally evaluated patient, reviewed documentation and agreed with assessment and plan of care as above.  Rufina Falco, DNP, FNP-BC Board certified Nurse Practitioner Neurology Department   LOS: 3 days   10/18/2018  10:32 AM  Alexis Goodell, MD Neurology [phone]  10/18/2018  1:50 PM

## 2018-10-18 NOTE — Care Management Important Message (Signed)
Copy of signed IM left with patient in room.  

## 2018-10-18 NOTE — Progress Notes (Signed)
CCPD Tx started w/o complication    49/96/92 1636  Peritoneal Catheter Right lower abdomen Continuous ambulatory  Placement Date/Time: 05/24/17 0920   Procedural Verification: Medical records & consent reviewed;Site marked with initials  Time out: Correct Patient;Correct Site;Correct Procedure;Special equipment/requirements available  Person Inserting Catheter: D...  Site Assessment Clean;Dry;Intact  Drainage Description None  Catheter status Accessed  Dressing Gauze/Drain sponge  Dressing Status Clean;Dry;Intact  Dressing Intervention Dressing changed  Cycler Setup  Total Number of Exchanges 4  Fill Volume 2000  Dianeal Solution Dextrose 1.5% in 6000 mL  Last Fill Volume 0  Fill Time - Minute(s) 10  Dwell Time - Hour(s) 1  Dwell Time - Minute(s) 34  Drain Time - Minute(s) 20 mins  Completion  Treatment Status Started  Fluid Balance - CCPD  Total Intake for Exchanges (mL) 8000 ml  Hand-Off documentation  Report given to (Full Name) Beatris Ship, RN   Report received from (Full Name) Lovena Le, RN

## 2018-10-18 NOTE — Progress Notes (Signed)
[facility]  ROUNDING NOTE   Subjective:  Patient only had 2 cycles of peritoneal dialysis overnight. We do plan to start the patient early on peritoneal dialysis tonight. Patient lethargic but is arousable. He was oriented to self and place.   Objective:  Vital signs in last 24 hours:  Temp:  [97.8 F (36.6 C)-98.4 F (36.9 C)] 98.4 F (36.9 C) (11/14 0750) Pulse Rate:  [83-89] 89 (11/14 0750) Resp:  [16-18] 18 (11/14 0750) BP: (126-140)/(65-77) 126/65 (11/14 0750) SpO2:  [96 %-98 %] 96 % (11/14 0750) Weight:  [73.6 kg] 73.6 kg (11/14 0454)  Weight change: 2.839 kg Filed Weights   10/16/18 1256 10/16/18 2000 10/18/18 0454  Weight: 70.8 kg 69 kg 73.6 kg    Intake/Output: I/O last 3 completed shifts: In: 8000 [Other:8000] Out: 016 [Urine:200; Other:724]   Intake/Output this shift:  No intake/output data recorded.  Physical Exam: General: No acute distress  Head: Normocephalic, atraumatic. Moist oral mucosal membranes  Eyes: Anicteric  Neck: Supple, trachea midline  Lungs:  Clear to auscultation, normal effort  Heart: S1S2 no rubs  Abdomen:  Soft, nontender, bowel sounds present  Extremities: No peripheral edema.  Neurologic: Lethargic but arousable  Skin: Healing zoster lesions on chest  Access: PD catheter in place    Basic Metabolic Panel: Recent Labs  Lab 10/14/18 0013 10/15/18 1608 10/16/18 0300  NA 130* 128* 132*  K 4.1 4.1 3.7  CL 90* 86* 93*  CO2 23 26 25   GLUCOSE 158* 107* 142*  BUN 46* 51* 52*  CREATININE 10.10* 10.53* 10.50*  CALCIUM 9.0 8.0* 8.1*    Liver Function Tests: Recent Labs  Lab 10/14/18 0013 10/15/18 1608  AST 18 17  ALT 21 18  ALKPHOS 92 85  BILITOT 0.8 0.8  PROT 7.2 6.9  ALBUMIN 4.5 4.2   No results for input(s): LIPASE, AMYLASE in the last 168 hours. Recent Labs  Lab 10/14/18 0013 10/15/18 1859 10/17/18 0731  AMMONIA 21 14 21     CBC: Recent Labs  Lab 10/14/18 0013 10/15/18 1608  WBC 9.9  8.8  NEUTROABS 8.2*  --   HGB 11.8* 11.9*  HCT 35.6* 34.9*  MCV 95.2 94.6  PLT 260 252    Cardiac Enzymes: No results for input(s): CKTOTAL, CKMB, CKMBINDEX, TROPONINI in the last 168 hours.  BNP: Invalid input(s): POCBNP  CBG: No results for input(s): GLUCAP in the last 168 hours.  Microbiology: Results for orders placed or performed during the hospital encounter of 05/03/18  Blood Culture (routine x 2)     Status: None   Collection Time: 05/03/18  3:48 PM  Result Value Ref Range Status   Specimen Description BLOOD LT [facility]  Final   Special Requests   Final    BOTTLES DRAWN AEROBIC AND ANAEROBIC Blood Culture adequate volume   Culture   Final    NO GROWTH 5 DAYS Performed at [facility], [address], [place], [place] [address]    Report Status 05/08/2018 FINAL  Final  Blood Culture (routine x 2)     Status: None   Collection Time: 05/03/18  3:57 PM  Result Value Ref Range Status   Specimen Description BLOOD RT [facility]  Final   Special Requests   Final    BOTTLES DRAWN AEROBIC AND ANAEROBIC Blood Culture adequate volume   Culture   Final    NO GROWTH 5 DAYS Performed at [facility], [address], [place], [place] [address]  Report Status 05/08/2018 FINAL  Final  Body fluid culture     Status: None   Collection Time: 05/03/18  4:23 PM  Result Value Ref Range Status   Specimen Description   Final    PERITONEAL CAVITY Performed at [facility], [address] [place], [place] [address]    Special Requests   Final    Normal Performed at [facility], [address] [place], [place] [address]    Gram Stain   Final    RARE WBC PRESENT, PREDOMINANTLY PMN NO ORGANISMS SEEN    Culture   Final    NO GROWTH 3 DAYS Performed at [facility], [place] [address], [place], [place] [address]    Report Status 05/07/2018 FINAL  Final    Coagulation Studies: No results for input(s): LABPROT, INR in the last 72  hours.  Urinalysis: Recent Labs    10/15/18 2329  COLORURINE STRAW*  LABSPEC 1.009  PHURINE 8.0  GLUCOSEU 150*  HGBUR SMALL*  BILIRUBINUR NEGATIVE  KETONESUR NEGATIVE  PROTEINUR 30*  NITRITE NEGATIVE  LEUKOCYTESUR NEGATIVE      Imaging: No results found.   Medications:   . sodium chloride    . dextrose 5 % and 0.9% NaCl 40 mL/hr at 10/17/18 1407  . dialysis solution 1.5% low-MG/low-CA 8,000 mL (10/16/18 2015)   . gentamicin cream  1 application Topical Daily  . heparin  5,000 Units Subcutaneous Q8H  . sodium chloride flush  3 mL Intravenous Q12H   sodium chloride, haloperidol lactate, hydrALAZINE, LORazepam, [DISCONTINUED] ondansetron **OR** ondansetron (ZOFRAN) IV, sodium chloride flush, triamcinolone cream  Assessment/ Plan:  57 y.o. male with ESRD on PD, anemia of chronic kidney disease, secondary hyperparathyroidism, gout, history of small bowel obstruction, herpes zoster infection   1.  ESRD on peritoneal dialysis.  Patient only completed 2 sessions of the peritoneal dialysis last night.  Therefore we will start him on peritoneal dialysis earlier tonight.  2.  Altered mental status.  Suspect that this is secondary to a combination of valacyclovir and prednisone however more likely valacyclovir as we have seen similar cases.  May have superimposed delirium now as well.   -Neurology following on the case.  MRI brain today.  3.  Anemia of chronic kidney disease.  Hemoglobin 11.9 at last check.  4.  Secondary hyperparathyroidism.  Currently n.p.o. binders on hold.   LOS: 3 Matthew Mathis 11/14/201910:42 AM

## 2018-10-18 NOTE — Progress Notes (Addendum)
Interpreter suzzane # [id number] utilized explain the plan for tonight and answer all pt question. No complaints of any pain at this time. Will continue to monitor.

## 2018-10-18 NOTE — Progress Notes (Signed)
SLP Cancellation Note  Patient Details Name: Matthew Mathis MRN: [id number] DOB: 08/07/1961   Cancelled treatment:       Reason Eval/Treat Not Completed: Patient not medically ready;Fatigue/lethargy limiting ability to participate(chart reviewed; met w/ pt and family in room 2x today).  Pt was somewhat lethargic this AM giving a few mumbled "yes" responses to MD in room for assessment. Reattempted to see pt post MRI, however, he was even more lethargic (snoring heavily) d/t being given Ativan for the MRI.  Explained to family present that BSE would be held today and attempted in the morning d/t pt's lethargy and risk for aspiration w/ any oral intake. Recommend oral care if awake later in the evening; gave swabs. NSG updated.     Orinda Kenner, MS, CCC-SLP [facility] 10/18/2018, 2:57 PM

## 2018-10-18 NOTE — Progress Notes (Signed)
Pt's peritoneal dialysis began to beep. Notified Dialysis nurse on call. Instructed to cut machine off in the back. Will continue to monitor.

## 2018-10-18 NOTE — Progress Notes (Signed)
Patient ID: Matthew Mathis, male   DOB: Nov 28, 1961, 57 y.o.   MRN: [id number]  [employer] PROGRESS NOTE  Matthew Mathis QPY:[id number] DOB: 15-Sep-1961 DOA: 10/15/2018 PCP: [facility]  HPI/Subjective: Patient was able to answer few questions in English.  I was in there with speech therapy and we had the interpreter.  The patient answered a few questions and went right back to sleep.  He was not as agitated as yesterday.  Objective: Vitals:   10/17/18 1922 10/18/18 0750  BP: 137/77 126/65  Pulse: 83 89  Resp: 16 18  Temp: 98.4 F (36.9 C) 98.4 F (36.9 C)  SpO2: 96% 96%    Filed Weights   10/16/18 1256 10/16/18 2000 10/18/18 0454  Weight: 70.8 kg 69 kg 73.6 kg    ROS: Review of Systems  Unable to perform ROS: Acuity of condition  Respiratory: Negative for shortness of breath.   Cardiovascular: Negative for chest pain.   Exam: Physical Exam  Constitutional: He appears lethargic.  HENT:  Nose: No mucosal edema.  Mouth/Throat: No oropharyngeal exudate or posterior oropharyngeal edema.  Eyes: Pupils are equal, round, and reactive to light. Conjunctivae, EOM and lids are normal.  Neck: No JVD present. Carotid bruit is not present. No edema present. No thyroid mass and no thyromegaly present.  Cardiovascular: S1 normal and S2 normal. Exam reveals no gallop.  No murmur heard. Pulses:      Dorsalis pedis pulses are 2+ on the right side, and 2+ on the left side.  Respiratory: No respiratory distress. He has no wheezes. He has no rhonchi. He has no rales.  GI: Soft. Bowel sounds are normal. There is no tenderness.  Musculoskeletal:       Right ankle: He exhibits no swelling.       Left ankle: He exhibits no swelling.  Lymphadenopathy:    He has no cervical adenopathy.  Neurological: He appears lethargic.  Moves all of his extremities on his own.  Able to straight leg raise to command.  Skin: Skin is warm. Nails show no clubbing.  Dried skin rash left chest   Psychiatric:  Patient lethargic answered a few questions and went back to sleep.      Data Reviewed: Basic Metabolic Panel: Recent Labs  Lab 10/14/18 0013 10/15/18 1608 10/16/18 0300  NA 130* 128* 132*  K 4.1 4.1 3.7  CL 90* 86* 93*  CO2 23 26 25   GLUCOSE 158* 107* 142*  BUN 46* 51* 52*  CREATININE 10.10* 10.53* 10.50*  CALCIUM 9.0 8.0* 8.1*   Liver Function Tests: Recent Labs  Lab 10/14/18 0013 10/15/18 1608  AST 18 17  ALT 21 18  ALKPHOS 92 85  BILITOT 0.8 0.8  PROT 7.2 6.9  ALBUMIN 4.5 4.2    Recent Labs  Lab 10/14/18 0013 10/15/18 1859 10/17/18 0731  AMMONIA 21 14 21    CBC: Recent Labs  Lab 10/14/18 0013 10/15/18 1608  WBC 9.9 8.8  NEUTROABS 8.2*  --   HGB 11.8* 11.9*  HCT 35.6* 34.9*  MCV 95.2 94.6  PLT 260 252    Scheduled Meds: . gentamicin cream  1 application Topical Daily  . heparin  5,000 Units Subcutaneous Q8H  . sodium chloride flush  3 mL Intravenous Q12H   Continuous Infusions: . sodium chloride    . dextrose 5 % and 0.9% NaCl 40 mL/hr at 10/18/18 1543  . dialysis solution 1.5% low-MG/low-CA 8,000 mL (10/16/18 2015)    Assessment/Plan:  1. Acute encephalopathy likely  secondary to Valtrex.  Valtrex toxicity with dosing to high as outpatient for him as a dialysis patient.  This will likely take much longer to get out of his system.  MRI of the brain negative for acute stroke.  Neurology following. 2. End-stage renal disease on peritoneal dialysis.  They will try to do peritoneal dialysis a little longer today because last night it did not work as well. 3. Hypertension.  Unable to take oral medications 4. Hyponatremia dialysis to manage 5. History of gout 6. Shingles left chest.  Has enough Valtrex and him that we do not need to dose this anymore.  Code Status:     Code Status Orders  (From admission, onward)         Start     Ordered   10/15/18 1845  Full code  Continuous     10/15/18 1844        Code Status  History    Date Active Date Inactive Code Status Order ID Comments User Context   05/03/2018 1749 05/09/2018 1842 Full Code [id number]  Loletha Grayer, MD ED     Family Communication: Spoke with wife at the bedside with the translator and son in the hallway. Disposition Plan: Unclear at this time  Consultants:  Nephrology  Neurology  Time spent: 35 minutes  [place]

## 2018-10-19 LAB — CBC
HEMATOCRIT: 31.8 % — AB (ref 39.0–52.0)
Hemoglobin: 10.4 g/dL — ABNORMAL LOW (ref 13.0–17.0)
MCH: 31.5 pg (ref 26.0–34.0)
MCHC: 32.7 g/dL (ref 30.0–36.0)
MCV: 96.4 fL (ref 80.0–100.0)
PLATELETS: 230 10*3/uL (ref 150–400)
RBC: 3.3 MIL/uL — AB (ref 4.22–5.81)
RDW: 14.8 % (ref 11.5–15.5)
WBC: 6.4 10*3/uL (ref 4.0–10.5)
nRBC: 0 % (ref 0.0–0.2)

## 2018-10-19 LAB — PHOSPHORUS: Phosphorus: 5.5 mg/dL — ABNORMAL HIGH (ref 2.5–4.6)

## 2018-10-19 MED ORDER — SEVELAMER CARBONATE 800 MG PO TABS
1600.0000 mg | ORAL_TABLET | Freq: Three times a day (TID) | ORAL | Status: DC
  Administered 2018-10-20: 1600 mg via ORAL
  Filled 2018-10-19: qty 2

## 2018-10-19 MED ORDER — CINACALCET HCL 30 MG PO TABS
90.0000 mg | ORAL_TABLET | Freq: Two times a day (BID) | ORAL | Status: DC
  Administered 2018-10-20: 90 mg via ORAL
  Filled 2018-10-19 (×3): qty 3

## 2018-10-19 MED ORDER — ALLOPURINOL 100 MG PO TABS
100.0000 mg | ORAL_TABLET | Freq: Every day | ORAL | Status: DC
  Administered 2018-10-19: 100 mg via ORAL
  Filled 2018-10-19 (×2): qty 1

## 2018-10-19 NOTE — Progress Notes (Signed)
Subjective: Patient is awake, alert and oriented x 3. He is sitting up in the bed. Family, speech therapist and Nephrology at the bedside. He is more alert today and able to converse in [place] without any difficulty. Per nursing report, patient has been less agitated and confused. He no longer requires a sitter or restraints. He is co-operating with care and able to follow commands without any difficulty. He has been cleared by speech for regular diet as he was able to tolerate by mouth intake without any problems.  Objective: Current vital signs: BP 122/81 (BP Location: Right Arm)   Pulse 99   Temp 98.1 F (36.7 C) (Oral)   Resp 18   Ht 5\' 7"  (1.702 m)   Wt 77.7 kg   SpO2 97%   BMI 26.83 kg/m  Vital signs in last 24 hours: Temp:  [97.8 F (36.6 C)-98.8 F (37.1 C)] 98.1 F (36.7 C) (11/15 0859) Pulse Rate:  [73-99] 99 (11/15 0859) Resp:  [16-18] 18 (11/15 0859) BP: (122-146)/(81-99) 122/81 (11/15 0859) SpO2:  [97 %-100 %] 97 % (11/15 0859) Weight:  [75.7 kg-77.7 kg] 77.7 kg (11/15 0500)  Intake/Output from previous day: 11/14 0701 - 11/15 0700 In: 17460.7 [I.V.:1460.7] Out: 8631 [Urine:400] Intake/Output this shift: No intake/output data recorded. Nutritional status:  Diet Order            Diet renal with fluid restriction Fluid restriction: 1200 mL Fluid; Room service appropriate? Yes with Assist; Fluid consistency: Thin  Diet effective now              Neurological Exam   Mental Status: Alert, oriented, thought content appropriate.  Speech fluent without evidence of aphasia.  Able to follow 3 step commands without difficulty. Attention span and concentration seemed appropriate  Cranial Nerves: II: Discs flat bilaterally; Visual fields grossly normal, pupils equal, round, reactive to light and accommodation III,IV, VI: ptosis not present, extra-ocular motions intact bilaterally V,VII: smile symmetric, facial light touch sensation intact VIII: hearing normal  bilaterally IX,X: gag reflex present XI: bilateral shoulder shrug XII: midline tongue extension Motor: Right :  Upper extremity   5/5 Without pronator drift      Left: Upper extremity   5/5 without pronator drift Right:   Lower extremity   5/5                                          Left: Lower extremity   5/5 Tone and bulk:normal tone throughout; no atrophy noted Sensory: Pinprick and light touch intact bilaterally Deep Tendon Reflexes: 2+ and symmetric throughout Plantars: Right: mute                              Left: downgoing Cerebellar: Finger-to-nose testing intact bilaterally. Heel to shin testing normal bilaterally  Lab Results: Basic Metabolic Panel: Recent Labs  Lab 10/14/18 0013 10/15/18 1608 10/16/18 0300  NA 130* 128* 132*  K 4.1 4.1 3.7  CL 90* 86* 93*  CO2 23 26 25   GLUCOSE 158* 107* 142*  BUN 46* 51* 52*  CREATININE 10.10* 10.53* 10.50*  CALCIUM 9.0 8.0* 8.1*    Liver Function Tests: Recent Labs  Lab 10/14/18 0013 10/15/18 1608  AST 18 17  ALT 21 18  ALKPHOS 92 85  BILITOT 0.8 0.8  PROT 7.2 6.9  ALBUMIN 4.5 4.2  No results for input(s): LIPASE, AMYLASE in the last 168 hours. Recent Labs  Lab 10/14/18 0013 10/15/18 1859 10/17/18 0731  AMMONIA 21 14 21     CBC: Recent Labs  Lab 10/14/18 0013 10/15/18 1608 10/19/18 0349  WBC 9.9 8.8 6.4  NEUTROABS 8.2*  --   --   HGB 11.8* 11.9* 10.4*  HCT 35.6* 34.9* 31.8*  MCV 95.2 94.6 96.4  PLT 260 252 230    Cardiac Enzymes: No results for input(s): CKTOTAL, CKMB, CKMBINDEX, TROPONINI in the last 168 hours.  Lipid Panel: No results for input(s): CHOL, TRIG, HDL, CHOLHDL, VLDL, LDLCALC in the last 168 hours.  CBG: No results for input(s): GLUCAP in the last 168 hours.  Microbiology: Results for orders placed or performed during the hospital encounter of 05/03/18  Blood Culture (routine x 2)     Status: None   Collection Time: 05/03/18  3:48 PM  Result Value Ref Range Status   Specimen  Description BLOOD LT [facility]  Final   Special Requests   Final    BOTTLES DRAWN AEROBIC AND ANAEROBIC Blood Culture adequate volume   Culture   Final    NO GROWTH 5 DAYS Performed at [facility], [address], [place], [place] [address]    Report Status 05/08/2018 FINAL  Final  Blood Culture (routine x 2)     Status: None   Collection Time: 05/03/18  3:57 PM  Result Value Ref Range Status   Specimen Description BLOOD RT [facility]  Final   Special Requests   Final    BOTTLES DRAWN AEROBIC AND ANAEROBIC Blood Culture adequate volume   Culture   Final    NO GROWTH 5 DAYS Performed at [facility], [address] [place], [place] [address]    Report Status 05/08/2018 FINAL  Final  Body fluid culture     Status: None   Collection Time: 05/03/18  4:23 PM  Result Value Ref Range Status   Specimen Description   Final    PERITONEAL CAVITY Performed at [facility], [address] [place], [place] [address]    Special Requests   Final    Normal Performed at [facility], [address] [place], [place] [address]    Gram Stain   Final    RARE WBC PRESENT, PREDOMINANTLY PMN NO ORGANISMS SEEN    Culture   Final    NO GROWTH 3 DAYS Performed at [facility], [place] [address], [place],  [address]    Report Status 05/07/2018 FINAL  Final    Coagulation Studies: No results for input(s): LABPROT, INR in the last 72 hours.  Imaging: Dg Eye Foreign Body  Result Date: 10/18/2018 CLINICAL DATA:  Metal working/exposure; clearance prior to MRI EXAM: ORBITS FOR FOREIGN BODY - 2 VIEW COMPARISON:  None. FINDINGS: There is no evidence of metallic foreign body within the orbits. No significant bone abnormality identified. IMPRESSION: No evidence of metallic foreign body within the orbits. Electronically Signed   By: Logan Bores M.D.   On: 10/18/2018 11:20   Mr Brain Wo Contrast  Result Date: 10/18/2018 CLINICAL DATA:  Initial evaluation for acute  altered mental status. EXAM: MRI HEAD WITHOUT CONTRAST TECHNIQUE: Multiplanar, multiecho pulse sequences of the brain and surrounding structures were obtained without intravenous contrast. COMPARISON:  Comparison made with prior CT from 10/14/2018. FINDINGS: Brain: Generalized age-related cerebral atrophy. Mild patchy T2/FLAIR hyperintensity within the periventricular deep white matter both cerebral hemispheres most consistent with  chronic small vessel ischemic disease, mild in nature. No abnormal foci of restricted diffusion to suggest acute or subacute ischemia. Gray-white matter differentiation maintained. No encephalomalacia to suggest chronic cortical infarction. No foci of susceptibility artifact to suggest acute or chronic intracranial hemorrhage. No mass lesion, midline shift or mass effect. No hydrocephalus. No extra-axial fluid collection. Pituitary gland within normal limits. Vascular: Major intracranial vascular flow voids maintained. Skull and upper cervical spine: Craniocervical junction within normal limits. Upper cervical spine normal. Bone marrow signal intensity within normal limits. No scalp soft tissue abnormality. Sinuses/Orbits: Globes and orbital soft tissues within normal limits. Mild scattered mucosal thickening within the ethmoidal air cells. Superimposed small right maxillary sinus retention cyst. No significant mastoid effusion. Inner ear structures normal. Other: None. IMPRESSION: 1. No acute intracranial abnormality. 2. Mild age-related cerebral atrophy with chronic small vessel ischemic disease. Electronically Signed   By: Jeannine Boga M.D.   On: 10/18/2018 13:38    Medications:  I have reviewed the patient's current medications. Prior to Admission:  Medications Prior to Admission  Medication Sig Dispense Refill Last Dose  . allopurinol (ZYLOPRIM) 100 MG tablet Take 100 mg by mouth every evening.    10/13/2018 at pm  . benazepril (LOTENSIN) 20 MG tablet Take 20 mg by  mouth daily.  11 10/13/2018 at am  . calcium acetate (PHOSLO) 667 MG capsule Take 667-1,334 mg by mouth See admin instructions. Take 1334 mg by mouth three times a day and take 667 mg by mouth with snacks.   10/13/2018 at pm  . cinacalcet (SENSIPAR) 60 MG tablet Take 90 mg by mouth 2 (two) times daily with a meal.    10/13/2018 at pm  . ketoconazole (NIZORAL) 2 % shampoo Apply 1 application topically 3 (three) times a week.   5 Past Week at Unknown time  . metoprolol succinate (TOPROL-XL) 50 MG 24 hr tablet Take 1 tablet (50 mg total) by mouth daily. Take with or immediately following a meal. 30 tablet 0 10/13/2018 at unknown  . Multiple Vitamins-Minerals (DIALYVITE 800/ULTRA D) TABS Take 1 tablet by mouth daily.  3 10/13/2018 at am  . torsemide (DEMADEX) 100 MG tablet Take 100 mg by mouth daily.  12 10/13/2018 at am  . triamcinolone cream (KENALOG) 0.1 % Apply 1 application topically 2 (two) times daily as needed (for inflammation).    unknown at unknown  . valACYclovir (VALTREX) 1000 MG tablet Take 500 mg by mouth 3 (three) times daily.    10/13/2018 at pm  . sevelamer carbonate (RENVELA) 800 MG tablet Take 2 tablets (1,600 mg total) by mouth 3 (three) times daily with meals. (Patient not taking: Reported on 07/17/2018) 90 tablet 0 Not Taking at Unknown time   Scheduled: . gentamicin cream  1 application Topical Daily  . heparin  5,000 Units Subcutaneous Q8H  . sodium chloride flush  3 mL Intravenous Q12H   Patient seen and examined.  Clinical course and management discussed.  Necessary edits performed.  I agree with the above.  Assessment and plan of care developed and discussed below.  Assessment: 57 y.o male with pertinent history of end-stage renal disease on peritoneal dialysis, anemia of chronic kidney disease, and hypertension presenting with altered mental status likely due to medication side effect acyclovir and possible metabolic encephalopathy. Mental status now improved almost  back to  baseline per family. MRI brain reviewed and did not show acute intracranial abnormality.   Plan: 1.  Agree with current medical management.  No further  neurologic intervention is recommended at this time.  If further questions arise, please call or page at that time.  Thank you for allowing neurology to participate in the care of this patient.    LOS: 4 days   10/19/2018  10:09 AM   Alexis Goodell, MD Neurology [phone]  10/19/2018  11:58 AM

## 2018-10-19 NOTE — Progress Notes (Signed)
[facility]  ROUNDING NOTE   Subjective:  Pt seen at bedside.  Much more awake and alert.  Passed swallow eval and placed on regular diet.    Objective:  Vital signs in last 24 hours:  Temp:  [97.8 F (36.6 C)-98.8 F (37.1 C)] 98.1 F (36.7 C) (11/15 0859) Pulse Rate:  [73-99] 99 (11/15 0859) Resp:  [16-18] 18 (11/15 0859) BP: (122-146)/(81-99) 122/81 (11/15 0859) SpO2:  [97 %-100 %] 97 % (11/15 0859) Weight:  [75.7 kg-77.7 kg] 77.7 kg (11/15 0500)  Weight change: 2.1 kg Filed Weights   10/18/18 0454 10/19/18 0419 10/19/18 0500  Weight: 73.6 kg 75.7 kg 77.7 kg    Intake/Output: I/O last 3 completed shifts: In: 25460.7 [I.V.:1460.7; Other:24000] Out: 8831 [Urine:600; Other:8231]   Intake/Output this shift:  No intake/output data recorded.  Physical Exam: General: No acute distress  Head: Normocephalic, atraumatic. Moist oral mucosal membranes  Eyes: Anicteric  Neck: Supple, trachea midline  Lungs:  Clear to auscultation, normal effort  Heart: S1S2 no rubs  Abdomen:  Soft, nontender, bowel sounds present  Extremities: No peripheral edema.  Neurologic: Lethargic but arousable  Skin: Healing zoster lesions on chest  Access: PD catheter in place    Basic Metabolic Panel: Recent Labs  Lab 10/14/18 0013 10/15/18 1608 10/16/18 0300  NA 130* 128* 132*  K 4.1 4.1 3.7  CL 90* 86* 93*  CO2 23 26 25   GLUCOSE 158* 107* 142*  BUN 46* 51* 52*  CREATININE 10.10* 10.53* 10.50*  CALCIUM 9.0 8.0* 8.1*    Liver Function Tests: Recent Labs  Lab 10/14/18 0013 10/15/18 1608  AST 18 17  ALT 21 18  ALKPHOS 92 85  BILITOT 0.8 0.8  PROT 7.2 6.9  ALBUMIN 4.5 4.2   No results for input(s): LIPASE, AMYLASE in the last 168 hours. Recent Labs  Lab 10/14/18 0013 10/15/18 1859 10/17/18 0731  AMMONIA 21 14 21     CBC: Recent Labs  Lab 10/14/18 0013 10/15/18 1608 10/19/18 0349  WBC 9.9 8.8 6.4  NEUTROABS 8.2*  --   --   HGB 11.8* 11.9* 10.4*   HCT 35.6* 34.9* 31.8*  MCV 95.2 94.6 96.4  PLT 260 252 230    Cardiac Enzymes: No results for input(s): CKTOTAL, CKMB, CKMBINDEX, TROPONINI in the last 168 hours.  BNP: Invalid input(s): POCBNP  CBG: No results for input(s): GLUCAP in the last 168 hours.  Microbiology: Results for orders placed or performed during the hospital encounter of 05/03/18  Blood Culture (routine x 2)     Status: None   Collection Time: 05/03/18  3:48 PM  Result Value Ref Range Status   Specimen Description BLOOD LT [facility]  Final   Special Requests   Final    BOTTLES DRAWN AEROBIC AND ANAEROBIC Blood Culture adequate volume   Culture   Final    NO GROWTH 5 DAYS Performed at [facility], [address] [place], [place] [address]    Report Status 05/08/2018 FINAL  Final  Blood Culture (routine x 2)     Status: None   Collection Time: 05/03/18  3:57 PM  Result Value Ref Range Status   Specimen Description BLOOD RT [facility]  Final   Special Requests   Final    BOTTLES DRAWN AEROBIC AND ANAEROBIC Blood Culture adequate volume   Culture   Final    NO GROWTH 5 DAYS Performed at [facility], [address] [place], [place] [address]    Report  Status 05/08/2018 FINAL  Final  Body fluid culture     Status: None   Collection Time: 05/03/18  4:23 PM  Result Value Ref Range Status   Specimen Description   Final    PERITONEAL CAVITY Performed at [facility], [address], [place], [place] [address]    Special Requests   Final    Normal Performed at [facility], [address] [place], [place] [address]    Gram Stain   Final    RARE WBC PRESENT, PREDOMINANTLY PMN NO ORGANISMS SEEN    Culture   Final    NO GROWTH 3 DAYS Performed at [facility], [place] [address] [place], [place] [address]    Report Status 05/07/2018 FINAL  Final    Coagulation Studies: No results for input(s): LABPROT, INR in the last 72 hours.  Urinalysis: No results for  input(s): COLORURINE, LABSPEC, PHURINE, GLUCOSEU, HGBUR, BILIRUBINUR, KETONESUR, PROTEINUR, UROBILINOGEN, NITRITE, LEUKOCYTESUR in the last 72 hours.  Invalid input(s): APPERANCEUR    Imaging: Dg Eye Foreign Body  Result Date: 10/18/2018 CLINICAL DATA:  Metal working/exposure; clearance prior to MRI EXAM: ORBITS FOR FOREIGN BODY - 2 VIEW COMPARISON:  None. FINDINGS: There is no evidence of metallic foreign body within the orbits. No significant bone abnormality identified. IMPRESSION: No evidence of metallic foreign body within the orbits. Electronically Signed   By: Logan Bores M.D.   On: 10/18/2018 11:20   Mr Brain Wo Contrast  Result Date: 10/18/2018 CLINICAL DATA:  Initial evaluation for acute altered mental status. EXAM: MRI HEAD WITHOUT CONTRAST TECHNIQUE: Multiplanar, multiecho pulse sequences of the brain and surrounding structures were obtained without intravenous contrast. COMPARISON:  Comparison made with prior CT from 10/14/2018. FINDINGS: Brain: Generalized age-related cerebral atrophy. Mild patchy T2/FLAIR hyperintensity within the periventricular deep white matter both cerebral hemispheres most consistent with chronic small vessel ischemic disease, mild in nature. No abnormal foci of restricted diffusion to suggest acute or subacute ischemia. Gray-white matter differentiation maintained. No encephalomalacia to suggest chronic cortical infarction. No foci of susceptibility artifact to suggest acute or chronic intracranial hemorrhage. No mass lesion, midline shift or mass effect. No hydrocephalus. No extra-axial fluid collection. Pituitary gland within normal limits. Vascular: Major intracranial vascular flow voids maintained. Skull and upper cervical spine: Craniocervical junction within normal limits. Upper cervical spine normal. Bone marrow signal intensity within normal limits. No scalp soft tissue abnormality. Sinuses/Orbits: Globes and orbital soft tissues within normal limits. Mild  scattered mucosal thickening within the ethmoidal air cells. Superimposed small right maxillary sinus retention cyst. No significant mastoid effusion. Inner ear structures normal. Other: None. IMPRESSION: 1. No acute intracranial abnormality. 2. Mild age-related cerebral atrophy with chronic small vessel ischemic disease. Electronically Signed   By: Jeannine Boga M.D.   On: 10/18/2018 13:38     Medications:   . sodium chloride    . dextrose 5 % and 0.9% NaCl 40 mL/hr at 10/18/18 2029  . dialysis solution 1.5% low-MG/low-CA 8,000 mL (10/16/18 2015)   . gentamicin cream  1 application Topical Daily  . heparin  5,000 Units Subcutaneous Q8H  . sodium chloride flush  3 mL Intravenous Q12H   sodium chloride, haloperidol lactate, hydrALAZINE, [DISCONTINUED] ondansetron **OR** ondansetron (ZOFRAN) IV, sodium chloride flush, triamcinolone cream  Assessment/ Plan:  57 y.o. male with ESRD on PD, anemia of chronic kidney disease, secondary hyperparathyroidism, gout, history of small bowel obstruction, herpes zoster infection   1.  ESRD on peritoneal dialysis.  Doing  well with PD, continue current PD prescription using 1.5% dextrose solution.   2.  Altered mental status.  Suspect that this is secondary to a combination of valacyclovir and prednisone however more likely valacyclovir as we have seen similar cases.  May have superimposed delirium now as well.   -Neurology following on the case.  MRI brain negative, mental status improving.    3.  Anemia of chronic kidney disease.  Hgb currently 10.4, receives epogen as outpt.   4.  Secondary hyperparathyroidism.  Pt starting to eat again, check phos tomorrow.    LOS: 4 Francee Setzer 11/15/201912:18 PM

## 2018-10-19 NOTE — Evaluation (Signed)
Clinical/Bedside Swallow Evaluation Patient Details  Name: Matthew Mathis MRN: [id number] Date of Birth: 1961/04/13  Today's Date: 10/19/2018 Time: SLP Start Time (ACUTE ONLY): 0830 SLP Stop Time (ACUTE ONLY): 0930 SLP Time Calculation (min) (ACUTE ONLY): 60 min  Past Medical History:  Past Medical History:  Diagnosis Date  . Chronic renal failure    hx   . Gout    hx  . HTN (hypertension)    Past Surgical History:  Past Surgical History:  Procedure Laterality Date  . CAPD INSERTION Right 05/24/2017   Procedure: LAPAROSCOPIC INSERTION CONTINUOUS AMBULATORY PERITONEAL DIALYSIS  (CAPD) CATHETER;  Surgeon: Katha Cabal, MD;  Location: [facility] ORS;  Service: Vascular;  Laterality: Right;  . DIALYSIS/PERMA CATHETER INSERTION N/A 05/07/2018   Procedure: DIALYSIS/PERMA CATHETER INSERTION;  Surgeon: Algernon Huxley, MD;  Location: [facility];  Service: Cardiovascular;  Laterality: N/A;  . DIALYSIS/PERMA CATHETER REMOVAL N/A 06/27/2017   Procedure: Dialysis/Perma Catheter Removal;  Surgeon: Katha Cabal, MD;  Location: [facility];  Service: Cardiovascular;  Laterality: N/A;  . DIALYSIS/PERMA CATHETER REMOVAL N/A 07/18/2018   Procedure: DIALYSIS/PERMA CATHETER REMOVAL;  Surgeon: Katha Cabal, MD;  Location: [facility];  Service: Cardiovascular;  Laterality: N/A;  . INSERTION OF DIALYSIS CATHETER Right 05/24/2017   Procedure: INSERTION OF DIALYSIS CATHETER ( TUNNELED CATH );  Surgeon: Katha Cabal, MD;  Location: [facility] ORS;  Service: Vascular;  Laterality: Right;  . PERITONEAL CATHETER INSERTION    . REMOVAL OF A DIALYSIS CATHETER Left 05/24/2017   Procedure: REMOVAL OF A DIALYSIS CATHETER ( PD CATH );  Surgeon: Katha Cabal, MD;  Location: [facility] ORS;  Service: Vascular;  Laterality: Left;  . RENAL BIOPSY     non specific    HPI:  Pt is a 57 y.o male who was recently diagnosed with shingles who has pertinent history of HTN, gout, chronic end-stage  renal disease on peritoneal dialysis, anemia of chronic kidney disease presenting with altered mental status likely due to medication side effect acyclovir and possible metabolic encephalopathy in light of having chronic renal failure. Mental status now improved almost  back to baseline per family; pt is able to converse in [place] and [place]. MRI brain reviewed and did not show acute intracranial abnormality.    Assessment / Plan / Recommendation Clinical Impression  Pt presents w/ increased alertness today and is conversing in both [place] and [place] w/ family and MD in room. Pt appears to present w/ adequate oropharyngeal phase swallowing function w/ no overt s/s of aspiration noted during trials at eval. Pt consumed po trials w/ no overt coughing or other s/s of aspiration; vocal quality clear b/t trials when assessed and no decline in respiratory status noted. Oral phase appeared [facility] for bolus management and oral clearing; mastication and A-P transit time appropriate. OM exam revealed no unilateral weakness orally. Pt fed self. Education given on the need to monitor environment for distractions and for less talking during eating/drinking as well as to allow pt to focus on the eating/drinking task. Recommend a Regular diet w/ thin liquids; general aspiration precautions; Pills in Puree if needed for safer swallowing. NSG to reconsult ST services if any decline in status noted during admission.  SLP Visit Diagnosis: Dysphagia, unspecified (R13.10)    Aspiration Risk  (reduced w/ precautions)    Diet Recommendation  Regular diet w/ thin liquids; general aspiration precautions. Tray setup at meals and support as needed while in hospital w/ mild mental  status decline  Medication Administration: Whole meds with puree(as needed for safer swallowing)    Other  Recommendations Recommended Consults: (Dietician f/u if needed) Oral Care Recommendations: Oral care BID Other Recommendations: (n/a)   Follow  up Recommendations None      Frequency and Duration (n/a)  (n/a)       Prognosis Prognosis for Safe Diet Advancement: Good Barriers to Reach Goals: (n/a)      Swallow Study   General Date of Onset: 10/15/18 HPI: Pt is a 57 y.o male who was recently diagnosed with shingles who has pertinent history of HTN, gout, chronic end-stage renal disease on peritoneal dialysis, anemia of chronic kidney disease presenting with altered mental status likely due to medication side effect acyclovir and possible metabolic encephalopathy in light of having chronic renal failure. Mental status now improved almost  back to baseline per family; pt is able to converse in [place] and [place]. MRI brain reviewed and did not show acute intracranial abnormality.  Type of Study: Bedside Swallow Evaluation Previous Swallow Assessment: none Diet Prior to this Study: NPO(regular diet at home per family) Temperature Spikes Noted: No(wbc 6.4) Respiratory Status: Room air History of Recent Intubation: No Behavior/Cognition: Alert;Cooperative;Pleasant mood;Distractible;Requires cueing(needed few cues) Oral Cavity Assessment: Within Functional Limits Oral Care Completed by SLP: Recent completion by staff Oral Cavity - Dentition: Adequate natural dentition Vision: Functional for self-feeding Self-Feeding Abilities: Able to feed self;Needs assist;Needs set up Patient Positioning: Upright in bed Baseline Vocal Quality: Normal Volitional Cough: Strong Volitional Swallow: Able to elicit    Oral/Motor/Sensory Function Overall Oral Motor/Sensory Function: Within functional limits   Ice Chips Ice chips: Within functional limits Presentation: Spoon(fed; 2 trials)   Thin Liquid Thin Liquid: Within functional limits Presentation: Cup;Self Fed(~4 ozs )    Nectar Thick Nectar Thick Liquid: Not tested   Honey Thick Honey Thick Liquid: Not tested   Puree Puree: Within functional limits Presentation: Self Fed;Spoon(~3-4 ozs)    Solid     Solid: Within functional limits Presentation: Spoon(fed; 5 trials)       Orinda Kenner, MS, CCC-SLP Watson,Katherine 10/19/2018,3:02 PM

## 2018-10-19 NOTE — Plan of Care (Signed)
  Problem: Safety: Goal: Ability to remain free from injury will improve Outcome: Progressing   

## 2018-10-19 NOTE — Progress Notes (Signed)
HD completed.  Total uf for the night shows a negative 1218ml.  2 attempts to manually drain peretoneum did not produce any fluid, so I believe patient to be empty.  Exit site is clean, dry and intact. Secured pd catheter with tape.

## 2018-10-19 NOTE — Plan of Care (Signed)

## 2018-10-19 NOTE — Progress Notes (Signed)
CCPD TX completed, set up for add'l 2 exchanges for lost Tx time

## 2018-10-19 NOTE — Progress Notes (Signed)
Patient ID: Matthew Mathis, male   DOB: Apr 22, 1961, 57 y.o.   MRN: [id number]  [employer] PROGRESS NOTE  Matthew Mathis FAO:[id number] DOB: [birth year] DOA: 10/15/2018 PCP: [facility]  HPI/Subjective: Patient doing much better today.  Offers no complaints.  Does have some discomfort in the chest at the site of his shingles.  Does not remember me from yesterday.  Objective: Vitals:   10/19/18 0425 10/19/18 0859  BP: 140/90 122/81  Pulse: 83 99  Resp: 16 18  Temp: 98.4 F (36.9 C) 98.1 F (36.7 C)  SpO2: 99% 97%    Filed Weights   10/18/18 0454 10/19/18 0419 10/19/18 0500  Weight: 73.6 kg 75.7 kg 77.7 kg    ROS: Review of Systems  Constitutional: Negative for chills and fever.  Eyes: Negative for blurred vision.  Respiratory: Negative for cough and shortness of breath.   Cardiovascular: Negative for chest pain.  Gastrointestinal: Negative for abdominal pain, constipation, diarrhea, nausea and vomiting.  Genitourinary: Negative for dysuria.  Musculoskeletal: Negative for joint pain.  Neurological: Negative for dizziness and headaches.   Exam: Physical Exam  HENT:  Nose: No mucosal edema.  Mouth/Throat: No oropharyngeal exudate or posterior oropharyngeal edema.  Eyes: Pupils are equal, round, and reactive to light. Conjunctivae, EOM and lids are normal.  Neck: No JVD present. Carotid bruit is not present. No edema present. No thyroid mass and no thyromegaly present.  Cardiovascular: S1 normal and S2 normal. Exam reveals no gallop.  No murmur heard. Pulses:      Dorsalis pedis pulses are 2+ on the right side, and 2+ on the left side.  Respiratory: No respiratory distress. He has no wheezes. He has no rhonchi. He has no rales.  GI: Soft. Bowel sounds are normal. There is no tenderness.  Musculoskeletal:       Right ankle: He exhibits no swelling.       Left ankle: He exhibits no swelling.  Lymphadenopathy:    He has no cervical adenopathy.  Neurological: He is  alert. No cranial nerve deficit.  Skin: Skin is warm. Nails show no clubbing.  Dried skin rash left chest  Psychiatric: He has a normal mood and affect.      Data Reviewed: Basic Metabolic Panel: Recent Labs  Lab 10/14/18 0013 10/15/18 1608 10/16/18 0300 10/19/18 1345  NA 130* 128* 132*  --   K 4.1 4.1 3.7  --   CL 90* 86* 93*  --   CO2 23 26 25   --   GLUCOSE 158* 107* 142*  --   BUN 46* 51* 52*  --   CREATININE 10.10* 10.53* 10.50*  --   CALCIUM 9.0 8.0* 8.1*  --   PHOS  --   --   --  5.5*   Liver Function Tests: Recent Labs  Lab 10/14/18 0013 10/15/18 1608  AST 18 17  ALT 21 18  ALKPHOS 92 85  BILITOT 0.8 0.8  PROT 7.2 6.9  ALBUMIN 4.5 4.2    Recent Labs  Lab 10/14/18 0013 10/15/18 1859 10/17/18 0731  AMMONIA 21 14 21    CBC: Recent Labs  Lab 10/14/18 0013 10/15/18 1608 10/19/18 0349  WBC 9.9 8.8 6.4  NEUTROABS 8.2*  --   --   HGB 11.8* 11.9* 10.4*  HCT 35.6* 34.9* 31.8*  MCV 95.2 94.6 96.4  PLT 260 252 230    Scheduled Meds: . gentamicin cream  1 application Topical Daily  . heparin  5,000 Units Subcutaneous Q8H  . sodium  chloride flush  3 mL Intravenous Q12H   Continuous Infusions: . sodium chloride    . dialysis solution 1.5% low-MG/low-CA 8,000 mL (10/16/18 2015)    Assessment/Plan:  1. Acute encephalopathy likely secondary to Valtrex.  Valtrex toxicity.  MRI of the brain negative for acute stroke.  Patient's mental status much improved today.  Anticipate discharge home tomorrow. 2. End-stage renal disease on peritoneal dialysis.  3. Hypertension.  Blood pressure stable off medication 4. Hyponatremia dialysis to manage 5. History of gout 6. Shingles left chest.   Code Status:     Code Status Orders  (From admission, onward)         Start     Ordered   10/15/18 1845  Full code  Continuous     10/15/18 1844        Code Status History    Date Active Date Inactive Code Status Order ID Comments User Context   05/03/2018  1749 05/09/2018 1842 Full Code [id number]  Loletha Grayer, MD ED     Family Communication: Spoke with son at the bedside Disposition Plan: Unclear at this time  Consultants:  Nephrology  Neurology  Time spent: 25 minutes  Ch[place]

## 2018-10-19 NOTE — Progress Notes (Signed)
PD tx start . Pre PD weight 74.7kg. Exit site care performed.      10/19/18 1817  Peritoneal Catheter Right lower abdomen Continuous ambulatory  Placement Date/Time: 05/24/17 0920   Procedural Verification: Medical records & consent reviewed;Site marked with initials  Time out: Correct Patient;Correct Site;Correct Procedure;Special equipment/requirements available  Person Inserting Catheter: D...  Site Assessment Clean;Dry;Intact  Catheter status Accessed  Dressing Gauze/Drain sponge  Dressing Status Clean;Dry;Intact  Cycler Setup  Total Number of Exchanges 4  Fill Volume 2000  Dianeal Solution Dextrose 1.5% in 6000 mL  Last Fill Volume 0  Fill Time - Minute(s) 10  Dwell Time - Hour(s) 1  Dwell Time - Minute(s) 34  Drain Time - Minute(s) 20 mins  Completion  Exit Site Care Performed Yes  Treatment Status Started  Fluid Balance - CCPD  Total Intake for Exchanges (mL) 8000 ml  Procedure Comments  Peritoneal Dialysis Comments pre PD weight 74.7kg  Education / Care Plan  Dialysis Education Provided Yes  Documented Education in Care Plan Yes

## 2018-10-19 NOTE — Progress Notes (Signed)
CCPC tx inititated for 2 exchanges for lost tx time.    10/19/18 0352  Peritoneal Catheter Right lower abdomen Continuous ambulatory  Placement Date/Time: 05/24/17 0920   Procedural Verification: Medical records & consent reviewed;Site marked with initials  Time out: Correct Patient;Correct Site;Correct Procedure;Special equipment/requirements available  Person Inserting Catheter: D...  Site Assessment Clean;Dry;Intact  Drainage Description None  Catheter status Accessed  Dressing Gauze/Drain sponge  Dressing Status Clean;Dry;Intact  Cycler Setup  Total Number of Exchanges 2  Fill Volume 2000  Dianeal Solution Dextrose 1.5% in 6000 mL  Last Fill Volume 0  Fill Time - Minute(s) 10  Dwell Time - Hour(s) 1  Dwell Time - Minute(s) 34  Drain Time - Minute(s) 20 mins

## 2018-10-20 MED ORDER — OXYCODONE-ACETAMINOPHEN 5-325 MG PO TABS
1.0000 | ORAL_TABLET | ORAL | Status: DC | PRN
  Administered 2018-10-20: 1 via ORAL
  Filled 2018-10-20 (×2): qty 1

## 2018-10-20 MED ORDER — VALACYCLOVIR HCL 500 MG PO TABS: 500.0000 mg | ORAL_TABLET | ORAL | 0 refills | Status: AC

## 2018-10-20 NOTE — Progress Notes (Signed)
Pd completed 

## 2018-10-20 NOTE — Progress Notes (Signed)
Speech Therapy Note: reviewed chart notes; consulted NSG re: pt's status this morning then met w/ pt and Wife in room. Pt and wife (also NSG) denied any difficulty swallowing during his breakfast meal this morning. No reports of difficulty noted per NSG last evening as well.  No further skilled ST services indicated at this time for dysphagia as pt appears to be at his baseline re: swallowing. NSG to reconsult if any decline in status while admitted. Recommended general use of aspiration precautions. Pt/Wife agreed.     Orinda Kenner, [place], CCC-SLP

## 2018-10-20 NOTE — Progress Notes (Signed)
[facility]  ROUNDING NOTE   Subjective:  Patient completed peritoneal dialysis. 545 cc was a net ultrafiltration. Patient sleeping this a.m. but arousable.  Objective:  Vital signs in last 24 hours:  Temp:  [97.6 F (36.4 C)-98.6 F (37 C)] 97.6 F (36.4 C) (11/16 0833) Pulse Rate:  [80-96] 80 (11/16 0833) Resp:  [21] 21 (11/16 0418) BP: (128-134)/(76-81) 132/81 (11/16 0833) SpO2:  [95 %-97 %] 97 % (11/16 0833) Weight:  [69.7 kg] 69.7 kg (11/16 0418)  Weight change: -6.027 kg Filed Weights   10/19/18 0419 10/19/18 0500 10/20/18 0418  Weight: 75.7 kg 77.7 kg 69.7 kg    Intake/Output: I/O last 3 completed shifts: In: 16980.7 [P.O.:120; I.V.:860.7; Other:16000] Out: 1610 [Urine:200; Other:8231]   Intake/Output this shift:  Total I/O In: -  Out: 545 [Other:545]  Physical Exam: General: No acute distress  Head: Normocephalic, atraumatic. Moist oral mucosal membranes  Eyes: Anicteric  Neck: Supple, trachea midline  Lungs:  Clear to auscultation, normal effort  Heart: S1S2 no rubs  Abdomen:  Soft, nontender, bowel sounds present  Extremities: No peripheral edema.  Neurologic: Lethargic but arousable  Skin: Healing zoster lesions on chest  Access: PD catheter in place    Basic Metabolic Panel: Recent Labs  Lab 10/14/18 0013 10/15/18 1608 10/16/18 0300 10/19/18 1345  NA 130* 128* 132*  --   K 4.1 4.1 3.7  --   CL 90* 86* 93*  --   CO2 23 26 25   --   GLUCOSE 158* 107* 142*  --   BUN 46* 51* 52*  --   CREATININE 10.10* 10.53* 10.50*  --   CALCIUM 9.0 8.0* 8.1*  --   PHOS  --   --   --  5.5*    Liver Function Tests: Recent Labs  Lab 10/14/18 0013 10/15/18 1608  AST 18 17  ALT 21 18  ALKPHOS 92 85  BILITOT 0.8 0.8  PROT 7.2 6.9  ALBUMIN 4.5 4.2   No results for input(s): LIPASE, AMYLASE in the last 168 hours. Recent Labs  Lab 10/14/18 0013 10/15/18 1859 10/17/18 0731  AMMONIA 21 14 21     CBC: Recent Labs  Lab 10/14/18 0013  10/15/18 1608 10/19/18 0349  WBC 9.9 8.8 6.4  NEUTROABS 8.2*  --   --   HGB 11.8* 11.9* 10.4*  HCT 35.6* 34.9* 31.8*  MCV 95.2 94.6 96.4  PLT 260 252 230    Cardiac Enzymes: No results for input(s): CKTOTAL, CKMB, CKMBINDEX, TROPONINI in the last 168 hours.  BNP: Invalid input(s): POCBNP  CBG: No results for input(s): GLUCAP in the last 168 hours.  Microbiology: Results for orders placed or performed during the hospital encounter of 05/03/18  Blood Culture (routine x 2)     Status: None   Collection Time: 05/03/18  3:48 PM  Result Value Ref Range Status   Specimen Description BLOOD LT [facility]  Final   Special Requests   Final    BOTTLES DRAWN AEROBIC AND ANAEROBIC Blood Culture adequate volume   Culture   Final    NO GROWTH 5 DAYS Performed at [facility], [address], [place], [place] [address]    Report Status 05/08/2018 FINAL  Final  Blood Culture (routine x 2)     Status: None   Collection Time: 05/03/18  3:57 PM  Result Value Ref Range Status   Specimen Description BLOOD RT [facility]  Final   Special Requests   Final    BOTTLES  DRAWN AEROBIC AND ANAEROBIC Blood Culture adequate volume   Culture   Final    NO GROWTH 5 DAYS Performed at [facility], [address] [place], [place] [address]    Report Status 05/08/2018 FINAL  Final  Body fluid culture     Status: None   Collection Time: 05/03/18  4:23 PM  Result Value Ref Range Status   Specimen Description   Final    PERITONEAL CAVITY Performed at [facility], [address], [place], [place] [address]    Special Requests   Final    Normal Performed at [facility], [address] [place], [place] [address]    Gram Stain   Final    RARE WBC PRESENT, PREDOMINANTLY PMN NO ORGANISMS SEEN    Culture   Final    NO GROWTH 3 DAYS Performed at [facility], [place] [address] [place], [place] [address]    Report Status 05/07/2018 FINAL  Final    Coagulation  Studies: No results for input(s): LABPROT, INR in the last 72 hours.  Urinalysis: No results for input(s): COLORURINE, LABSPEC, PHURINE, GLUCOSEU, HGBUR, BILIRUBINUR, KETONESUR, PROTEINUR, UROBILINOGEN, NITRITE, LEUKOCYTESUR in the last 72 hours.  Invalid input(s): APPERANCEUR    Imaging: Dg Eye Foreign Body  Result Date: 10/18/2018 CLINICAL DATA:  Metal working/exposure; clearance prior to MRI EXAM: ORBITS FOR FOREIGN BODY - 2 VIEW COMPARISON:  None. FINDINGS: There is no evidence of metallic foreign body within the orbits. No significant bone abnormality identified. IMPRESSION: No evidence of metallic foreign body within the orbits. Electronically Signed   By: Logan Bores M.D.   On: 10/18/2018 11:20   Mr Brain Wo Contrast  Result Date: 10/18/2018 CLINICAL DATA:  Initial evaluation for acute altered mental status. EXAM: MRI HEAD WITHOUT CONTRAST TECHNIQUE: Multiplanar, multiecho pulse sequences of the brain and surrounding structures were obtained without intravenous contrast. COMPARISON:  Comparison made with prior CT from 10/14/2018. FINDINGS: Brain: Generalized age-related cerebral atrophy. Mild patchy T2/FLAIR hyperintensity within the periventricular deep white matter both cerebral hemispheres most consistent with chronic small vessel ischemic disease, mild in nature. No abnormal foci of restricted diffusion to suggest acute or subacute ischemia. Gray-white matter differentiation maintained. No encephalomalacia to suggest chronic cortical infarction. No foci of susceptibility artifact to suggest acute or chronic intracranial hemorrhage. No mass lesion, midline shift or mass effect. No hydrocephalus. No extra-axial fluid collection. Pituitary gland within normal limits. Vascular: Major intracranial vascular flow voids maintained. Skull and upper cervical spine: Craniocervical junction within normal limits. Upper cervical spine normal. Bone marrow signal intensity within normal limits. No  scalp soft tissue abnormality. Sinuses/Orbits: Globes and orbital soft tissues within normal limits. Mild scattered mucosal thickening within the ethmoidal air cells. Superimposed small right maxillary sinus retention cyst. No significant mastoid effusion. Inner ear structures normal. Other: None. IMPRESSION: 1. No acute intracranial abnormality. 2. Mild age-related cerebral atrophy with chronic small vessel ischemic disease. Electronically Signed   By: Jeannine Boga M.D.   On: 10/18/2018 13:38     Medications:   . sodium chloride    . dialysis solution 1.5% low-MG/low-CA 8,000 mL (10/16/18 2015)   . allopurinol  100 mg Oral QHS  . cinacalcet  90 mg Oral BID WC  . gentamicin cream  1 application Topical Daily  . heparin  5,000 Units Subcutaneous Q8H  . sevelamer carbonate  1,600 mg Oral TID WC  . sodium chloride flush  3 mL Intravenous Q12H   sodium chloride,  haloperidol lactate, hydrALAZINE, [DISCONTINUED] ondansetron **OR** ondansetron (ZOFRAN) IV, oxyCODONE-acetaminophen, sodium chloride flush, triamcinolone cream  Assessment/ Plan:  57 y.o. male with ESRD on PD, anemia of chronic kidney disease, secondary hyperparathyroidism, gout, history of small bowel obstruction, herpes zoster infection   1.  ESRD on peritoneal dialysis.  Patient completed PD this a.m.  Ultrafiltration achieved was 545 cc.  Continue to monitor.  2.  Altered mental status.  Suspect that this is secondary to a combination of valacyclovir and prednisone however more likely valacyclovir as we have seen similar cases.  May have superimposed delirium now as well.   -Patient sleepy this a.m. but is arousable.  He was answering questions appropriately.  3.  Anemia of chronic kidney disease.  Continue to periodically monitor CBC.  4.  Secondary hyperparathyroidism.  Phosphorus was a bit high at 5.5 however patient was not eating previously.  Continue Renvela.   LOS: 5 Matthew Mathis 11/16/201910:06 AM

## 2018-10-20 NOTE — Plan of Care (Signed)
  Problem: Education: Goal: Knowledge of General Education information will improve Description Including pain rating scale, medication(s)/side effects and non-pharmacologic comfort measures Outcome: Progressing   Problem: Health Behavior/Discharge Planning: Goal: Ability to manage health-related needs will improve Outcome: Progressing   Problem: Activity: Goal: Risk for activity intolerance will decrease Outcome: Progressing   Problem: Nutrition: Goal: Adequate nutrition will be maintained Outcome: Progressing   Problem: Safety: Goal: Ability to remain free from injury will improve Outcome: Progressing   Problem: Education: Goal: Knowledge of disease and its progression will improve Outcome: Progressing

## 2018-10-20 NOTE — Progress Notes (Signed)
Discharge paperwork reviewed with patient and son. No questions or concerns at this time.  Pt discharged via wheelchair home.

## 2018-10-20 NOTE — Discharge Summary (Signed)
[facility] NAME: Matthew Mathis    MR#:  [id number]  DATE OF BIRTH:  1961/01/11  DATE OF ADMISSION:  10/15/2018 ADMITTING PHYSICIAN: Gorden Harms, MD  DATE OF DISCHARGE: 10/20/2018 12:27 PM  PRIMARY CARE PHYSICIAN: Alexis Goodell, MD   ADMISSION DIAGNOSIS:  Altered mental status, unspecified altered mental status type [R41.82] End-stage renal disease on peritoneal dialysis Hypertension Shingles Acute hyponatremia DISCHARGE DIAGNOSIS:  Active Problems:   Encephalopathy acute Hyponatremia Hypertension Shingles End-stage renal disease on peritoneal dialysis  SECONDARY DIAGNOSIS:   Past Medical History:  Diagnosis Date  . Chronic renal failure    hx   . Gout    hx  . HTN (hypertension)      ADMITTING HISTORY  Matthew Mathis  is a 57 y.o. male with a known history per below, recently diagnosed with shingles-started on prednisone/valacyclovir Friday, developed odd behavior, confusion, problems with speech the following day, patient brought to the emergency room for further evaluation that included CT of the head which was negative-prednisone was discontinued/patient sent home on continued valacyclovir, patient continues to have no improvement in symptomatology and subsequently brought back to the emergency room today with same complaints, ER work-up noted for sodium 128, chloride 86, creatinine 10, BUN 51, patient is now being admitted for acute encephalopathy most likely due to medication side effect from prednisone and Valtrex.   HOSPITAL COURSE:  Patient admitted to medical floor.  Patient was on high-dose valacyclovir at home taking 3 times daily.  Patient is a peritoneal dialysis patient.  Nephrology consultation was done.  Patient's hyponatremia improved with salt tablets.  Patient received peritoneal dialysis during hospitalization.  His Valtrex was changed to 100 mg orally every 48 hours.  Neurology consultation was  also done during hospitalization. Patient was worked up with CT head and MRI brain which showed cerebral atrophy but no acute disease.  Mental status has improved.  Hyponatremia also resolved.  Patient will be discharged home with home health services. CONSULTS OBTAINED:  Treatment Team:  Anthonette Legato, MD Catarina Hartshorn, MD  DRUG ALLERGIES:  No Known Allergies  DISCHARGE MEDICATIONS:   Allergies as of 10/20/2018   No Known Allergies     Medication List    STOP taking these medications   sevelamer carbonate 800 MG tablet Commonly known as:  RENVELA     TAKE these medications   allopurinol 100 MG tablet Commonly known as:  ZYLOPRIM Take 100 mg by mouth every evening.   benazepril 20 MG tablet Commonly known as:  LOTENSIN Take 20 mg by mouth daily.   calcium acetate 667 MG capsule Commonly known as:  PHOSLO Take 667-1,334 mg by mouth See admin instructions. Take 1334 mg by mouth three times a day and take 667 mg by mouth with snacks.   cinacalcet 60 MG tablet Commonly known as:  SENSIPAR Take 90 mg by mouth 2 (two) times daily with a meal.   DIALYVITE 800/ULTRA D Tabs Take 1 tablet by mouth daily.   ketoconazole 2 % shampoo Commonly known as:  NIZORAL Apply 1 application topically 3 (three) times a week.   metoprolol succinate 50 MG 24 hr tablet Commonly known as:  TOPROL-XL Take 1 tablet (50 mg total) by mouth daily. Take with or immediately following a meal.   torsemide 100 MG tablet Commonly known as:  DEMADEX Take 100 mg by mouth daily.   triamcinolone cream 0.1 % Commonly known as:  KENALOG Apply 1 application  topically 2 (two) times daily as needed (for inflammation).   valACYclovir 500 MG tablet Commonly known as:  VALTREX Take 1 tablet (500 mg total) by mouth every other day for 15 doses. What changed:    medication strength  when to take this Notes to patient:  Every other day for 30 days.       Today  Patient seen and evaluated  today No complaints Tolerating diet well  VITAL SIGNS:  Blood pressure 132/81, pulse 80, temperature 97.6 F (36.4 C), temperature source Oral, resp. rate (!) 21, height 5\' 7"  (1.702 m), weight 69.7 kg, SpO2 97 %.  I/O:    Intake/Output Summary (Last 24 hours) at 10/20/2018 1436 Last data filed at 10/20/2018 0815 Gross per 24 hour  Intake 8522.67 ml  Output 545 ml  Net 7977.67 ml    PHYSICAL EXAMINATION:  Physical Exam  GENERAL:  57 y.o.-year-old patient lying in the bed with no acute distress.  LUNGS: Normal breath sounds bilaterally, no wheezing, rales,rhonchi or crepitation. No use of accessory muscles of respiration.  CARDIOVASCULAR: S1, S2 normal. No murmurs, rubs, or gallops.  ABDOMEN: Soft, non-tender, non-distended. Bowel sounds present. No organomegaly or mass.  NEUROLOGIC: Moves all 4 extremities. PSYCHIATRIC: The patient is alert and oriented x 3.  SKIN: No obvious rash, lesion, or ulcer.   DATA REVIEW:   CBC Recent Labs  Lab 10/19/18 0349  WBC 6.4  HGB 10.4*  HCT 31.8*  PLT 230    Chemistries  Recent Labs  Lab 10/15/18 1608 10/16/18 0300  NA 128* 132*  K 4.1 3.7  CL 86* 93*  CO2 26 25  GLUCOSE 107* 142*  BUN 51* 52*  CREATININE 10.53* 10.50*  CALCIUM 8.0* 8.1*  AST 17  --   ALT 18  --   ALKPHOS 85  --   BILITOT 0.8  --     Cardiac Enzymes No results for input(s): TROPONINI in the last 168 hours.  Microbiology Results  Results for orders placed or performed during the hospital encounter of 05/03/18  Blood Culture (routine x 2)     Status: None   Collection Time: 05/03/18  3:48 PM  Result Value Ref Range Status   Specimen Description BLOOD LT [facility]  Final   Special Requests   Final    BOTTLES DRAWN AEROBIC AND ANAEROBIC Blood Culture adequate volume   Culture   Final    NO GROWTH 5 DAYS Performed at [facility], [address], [place], [place] [address]    Report Status 05/08/2018 FINAL  Final  Blood Culture (routine x 2)      Status: None   Collection Time: 05/03/18  3:57 PM  Result Value Ref Range Status   Specimen Description BLOOD RT [facility]  Final   Special Requests   Final    BOTTLES DRAWN AEROBIC AND ANAEROBIC Blood Culture adequate volume   Culture   Final    NO GROWTH 5 DAYS Performed at [facility], [address], [place], [place] [address]    Report Status 05/08/2018 FINAL  Final  Body fluid culture     Status: None   Collection Time: 05/03/18  4:23 PM  Result Value Ref Range Status   Specimen Description   Final    PERITONEAL CAVITY Performed at [facility], [address], [place],  [address]    Special Requests   Final    Normal Performed at [facility], [address] [place], [place]  [address]    Gram Stain   Final    RARE WBC PRESENT, PREDOMINANTLY PMN NO ORGANISMS SEEN    Culture   Final    NO GROWTH 3 DAYS Performed at [facility] [address], [place],  [address]    Report Status 05/07/2018 FINAL  Final    RADIOLOGY:  No results found.  Follow up with PCP in 1 week.  Management plans discussed with the patient, family and they are in agreement.  CODE STATUS: Full code    Code Status Orders  (From admission, onward)         Start     Ordered   10/15/18 1845  Full code  Continuous     10/15/18 1844        Code Status History    Date Active Date Inactive Code Status Order ID Comments User Context   05/03/2018 1749 05/09/2018 1842 Full Code [id number]  Loletha Grayer, MD ED      TOTAL TIME TAKING CARE OF THIS PATIENT ON DAY OF DISCHARGE: more than 34 minutes.   Saundra Shelling M.D on 10/20/2018 at 2:36 PM  Between 7am to 6pm - Pager - [phone]  After 6pm go to [email or web address] - password [email or web address] [facility]  Office  [phone]  CC: Primary care physician; Alexis Goodell, MD  Note: This dictation was prepared with Dragon dictation along with smaller phrase technology. Any  transcriptional errors that result from this process are unintentional.

## 2018-10-20 NOTE — Care Management Note (Signed)
Case Management Note  Patient Details  Name: Hung Rhinesmith MRN: [id number] Date of Birth: 1961/01/28  Subjective/Objective:   Patient to be discharged per MD order. Orders in place for home health services. Spoke with spouse and adult son who are unsure about the need for home health but are willing to give it a try. Per preference referral placed with [facility]. Confirmed referral with [place] from [facility]. No DME needs. Patient taken home by family.                   Action/Plan:   Expected Discharge Date:  10/20/18               Expected Discharge Plan:  [place]  In-House Referral:     Discharge planning Services  CM Consult  Post Acute Care Choice:  Home Health Choice offered to:  Patient, Spouse, Adult Children  DME Arranged:    DME Agency:     HH Arranged:  RN, PT [place] Agency:  [facility]  Status of Service:  Completed, signed off  If discussed at [place] of Stay Meetings, dates discussed:    Additional Comments:  Latanya Maudlin, RN 10/20/2018, 12:51 PM

## 2018-10-20 NOTE — Progress Notes (Addendum)
Pt complaining of 5 out 10 left arm pain. Notify prime. Will continue to monitor.  Update 0423: Dr. Marcille Blanco ordered oxycodone-acetaminophen 5-325 MG 1 tablet every 4 hours as needed for moderate pain. Will continue to monitor.

## 2018-10-23 ENCOUNTER — Telehealth: Payer: Self-pay

## 2018-10-23 NOTE — Telephone Encounter (Signed)
Flagged on EMMI report for not having a follow up scheduled.  Called and spoke with patient who mentioned he had not reached out to Dr. Elwyn Lade office to schedule appointment.  Encouraged him to do so and that the number was on his discharge papers.  No further questions or concerns.  I thanked him for his time and informed him that he would receive one more automated call checking in over the next few days.

## 2018-11-09 ENCOUNTER — Encounter: Admit: 2018-11-09 | Discharge: 2018-11-09 | Payer: MEDICARE | Attending: Nephrology | Primary: Nephrology

## 2018-11-12 ENCOUNTER — Other Ambulatory Visit: Payer: Self-pay

## 2018-11-12 ENCOUNTER — Inpatient Hospital Stay
Admission: EM | Admit: 2018-11-12 | Discharge: 2018-11-16 | DRG: 388 | Disposition: A | Discharge status: Home / Self Care | Payer: Medicare Other | Attending: Internal Medicine | Admitting: Internal Medicine

## 2018-11-12 ENCOUNTER — Emergency Department: Payer: Medicare Other

## 2018-11-12 ENCOUNTER — Encounter: Payer: Self-pay | Admitting: Emergency Medicine

## 2018-11-12 DIAGNOSIS — F17210 Nicotine dependence, cigarettes, uncomplicated: Secondary | ICD-10-CM | POA: Diagnosis present

## 2018-11-12 DIAGNOSIS — N186 End stage renal disease: Secondary | ICD-10-CM | POA: Diagnosis present

## 2018-11-12 DIAGNOSIS — B029 Zoster without complications: Secondary | ICD-10-CM | POA: Diagnosis present

## 2018-11-12 DIAGNOSIS — E8729 Other acidosis: Secondary | ICD-10-CM

## 2018-11-12 DIAGNOSIS — M109 Gout, unspecified: Secondary | ICD-10-CM | POA: Diagnosis present

## 2018-11-12 DIAGNOSIS — N2581 Secondary hyperparathyroidism of renal origin: Secondary | ICD-10-CM | POA: Diagnosis present

## 2018-11-12 DIAGNOSIS — Z992 Dependence on renal dialysis: Secondary | ICD-10-CM

## 2018-11-12 DIAGNOSIS — K56609 Unspecified intestinal obstruction, unspecified as to partial versus complete obstruction: Secondary | ICD-10-CM | POA: Diagnosis present

## 2018-11-12 DIAGNOSIS — Z79899 Other long term (current) drug therapy: Secondary | ICD-10-CM | POA: Diagnosis not present

## 2018-11-12 DIAGNOSIS — Z833 Family history of diabetes mellitus: Secondary | ICD-10-CM

## 2018-11-12 DIAGNOSIS — E872 Acidosis: Secondary | ICD-10-CM | POA: Diagnosis present

## 2018-11-12 DIAGNOSIS — E871 Hypo-osmolality and hyponatremia: Secondary | ICD-10-CM | POA: Diagnosis present

## 2018-11-12 DIAGNOSIS — I120 Hypertensive chronic kidney disease with stage 5 chronic kidney disease or end stage renal disease: Secondary | ICD-10-CM | POA: Diagnosis present

## 2018-11-12 DIAGNOSIS — B0229 Other postherpetic nervous system involvement: Secondary | ICD-10-CM | POA: Diagnosis present

## 2018-11-12 DIAGNOSIS — Z841 Family history of disorders of kidney and ureter: Secondary | ICD-10-CM | POA: Diagnosis not present

## 2018-11-12 DIAGNOSIS — Z7682 Awaiting organ transplant status: Secondary | ICD-10-CM | POA: Diagnosis not present

## 2018-11-12 DIAGNOSIS — K5651 Intestinal adhesions [bands], with partial obstruction: Principal | ICD-10-CM | POA: Diagnosis present

## 2018-11-12 DIAGNOSIS — R109 Unspecified abdominal pain: Secondary | ICD-10-CM | POA: Diagnosis present

## 2018-11-12 DIAGNOSIS — E875 Hyperkalemia: Secondary | ICD-10-CM | POA: Diagnosis present

## 2018-11-12 DIAGNOSIS — D631 Anemia in chronic kidney disease: Secondary | ICD-10-CM | POA: Diagnosis present

## 2018-11-12 DIAGNOSIS — Z8249 Family history of ischemic heart disease and other diseases of the circulatory system: Secondary | ICD-10-CM

## 2018-11-12 LAB — COMPREHENSIVE METABOLIC PANEL
ALT: 18 U/L (ref 0–44)
AST: 23 U/L (ref 15–41)
Albumin: 4.8 g/dL (ref 3.5–5.0)
Alkaline Phosphatase: 115 U/L (ref 38–126)
Anion gap: 18 — ABNORMAL HIGH (ref 5–15)
BILIRUBIN TOTAL: 1 mg/dL (ref 0.3–1.2)
BUN: 59 mg/dL — AB (ref 6–20)
CALCIUM: 9.4 mg/dL (ref 8.9–10.3)
CHLORIDE: 90 mmol/L — AB (ref 98–111)
CO2: 21 mmol/L — ABNORMAL LOW (ref 22–32)
CREATININE: 10.39 mg/dL — AB (ref 0.61–1.24)
GFR calc Af Amer: 6 mL/min — ABNORMAL LOW (ref >60)
GFR, EST NON AFRICAN AMERICAN: 5 mL/min — AB (ref >60)
Glucose, Bld: 171 mg/dL — ABNORMAL HIGH (ref 70–99)
Potassium: 6 mmol/L — ABNORMAL HIGH (ref 3.5–5.1)
Sodium: 129 mmol/L — ABNORMAL LOW (ref 135–145)
TOTAL PROTEIN: 7.4 g/dL (ref 6.5–8.1)

## 2018-11-12 LAB — CBC WITH DIFFERENTIAL/PLATELET
ABS IMMATURE GRANULOCYTES: 0.09 10*3/uL — AB (ref 0.00–0.07)
BASOS PCT: 0 %
Basophils Absolute: 0.1 10*3/uL (ref 0.0–0.1)
EOS ABS: 0.2 10*3/uL (ref 0.0–0.5)
Eosinophils Relative: 1 %
HCT: 41.1 % (ref 39.0–52.0)
Hemoglobin: 13.6 g/dL (ref 13.0–17.0)
Immature Granulocytes: 1 %
LYMPHS ABS: 0.2 10*3/uL — AB (ref 0.7–4.0)
Lymphocytes Relative: 1 %
MCH: 32.9 pg (ref 26.0–34.0)
MCHC: 33.1 g/dL (ref 30.0–36.0)
MCV: 99.5 fL (ref 80.0–100.0)
MONOS PCT: 5 %
Monocytes Absolute: 1 10*3/uL (ref 0.1–1.0)
NEUTROS ABS: 16.6 10*3/uL — AB (ref 1.7–7.7)
Neutrophils Relative %: 92 %
PLATELETS: 309 10*3/uL (ref 150–400)
RBC: 4.13 MIL/uL — ABNORMAL LOW (ref 4.22–5.81)
RDW: 17.3 % — ABNORMAL HIGH (ref 11.5–15.5)
WBC: 18 10*3/uL — ABNORMAL HIGH (ref 4.0–10.5)
nRBC: 0 % (ref 0.0–0.2)

## 2018-11-12 LAB — TROPONIN I

## 2018-11-12 LAB — LIPASE, BLOOD: LIPASE: 62 U/L — AB (ref 11–51)

## 2018-11-12 MED ORDER — FENTANYL CITRATE (PF) 100 MCG/2ML IJ SOLN
50.0000 ug | Freq: Once | INTRAMUSCULAR | Status: AC
  Administered 2018-11-12: 50 ug via INTRAVENOUS
  Filled 2018-11-12: qty 2

## 2018-11-12 MED ORDER — TORSEMIDE 100 MG PO TABS
100.0000 mg | ORAL_TABLET | Freq: Every day | ORAL | Status: DC
  Administered 2018-11-13 – 2018-11-16 (×4): 100 mg via ORAL
  Filled 2018-11-12 (×4): qty 1

## 2018-11-12 MED ORDER — CALCIUM ACETATE (PHOS BINDER) 667 MG PO CAPS: 667.0000 mg | ORAL_CAPSULE | ORAL | Status: DC

## 2018-11-12 MED ORDER — RENA-VITE PO TABS
1.0000 | ORAL_TABLET | Freq: Every day | ORAL | Status: DC
  Administered 2018-11-13 – 2018-11-16 (×3): 1 via ORAL
  Filled 2018-11-12 (×4): qty 1

## 2018-11-12 MED ORDER — GENTAMICIN SULFATE 0.1 % EX CREA
1.0000 "application " | TOPICAL_CREAM | Freq: Every day | CUTANEOUS | Status: DC
  Administered 2018-11-12 – 2018-11-16 (×3): 1 via TOPICAL
  Filled 2018-11-12: qty 15

## 2018-11-12 MED ORDER — ONDANSETRON HCL 4 MG/2ML IJ SOLN
4.0000 mg | Freq: Once | INTRAMUSCULAR | Status: AC
  Administered 2018-11-12: 4 mg via INTRAVENOUS
  Filled 2018-11-12: qty 2

## 2018-11-12 MED ORDER — METRONIDAZOLE IN NACL 5-0.79 MG/ML-% IV SOLN
500.0000 mg | Freq: Three times a day (TID) | INTRAVENOUS | Status: DC
  Administered 2018-11-12 – 2018-11-13 (×2): 500 mg via INTRAVENOUS
  Filled 2018-11-12 (×4): qty 100

## 2018-11-12 MED ORDER — SODIUM CHLORIDE 0.9 % IV BOLUS
500.0000 mL | Freq: Once | INTRAVENOUS | Status: AC
  Administered 2018-11-12: 500 mL via INTRAVENOUS

## 2018-11-12 MED ORDER — SODIUM CHLORIDE 0.9 % IV SOLN
2.0000 g | INTRAVENOUS | Status: DC
  Administered 2018-11-12: 2 g via INTRAVENOUS
  Filled 2018-11-12: qty 2
  Filled 2018-11-12: qty 20

## 2018-11-12 MED ORDER — HEPARIN SODIUM (PORCINE) 5000 UNIT/ML IJ SOLN
5000.0000 [IU] | Freq: Three times a day (TID) | INTRAMUSCULAR | Status: DC
  Administered 2018-11-13 – 2018-11-14 (×5): 5000 [IU] via SUBCUTANEOUS
  Filled 2018-11-12 (×7): qty 1

## 2018-11-12 MED ORDER — KETOCONAZOLE 2 % EX SHAM
1.0000 "application " | MEDICATED_SHAMPOO | CUTANEOUS | Status: DC
  Administered 2018-11-16: 1 via TOPICAL
  Filled 2018-11-12: qty 120

## 2018-11-12 MED ORDER — CINACALCET HCL 30 MG PO TABS
90.0000 mg | ORAL_TABLET | Freq: Two times a day (BID) | ORAL | Status: DC
  Administered 2018-11-13 – 2018-11-16 (×4): 90 mg via ORAL
  Filled 2018-11-12 (×8): qty 3

## 2018-11-12 MED ORDER — METOPROLOL TARTRATE 5 MG/5ML IV SOLN: 2.5000 mg | Freq: Four times a day (QID) | INTRAVENOUS | Status: DC | PRN

## 2018-11-12 MED ORDER — SODIUM CHLORIDE 0.9 % IV SOLN
INTRAVENOUS | Status: DC
  Administered 2018-11-12 – 2018-11-13 (×2): via INTRAVENOUS

## 2018-11-12 MED ORDER — BENAZEPRIL HCL 20 MG PO TABS
20.0000 mg | ORAL_TABLET | Freq: Every day | ORAL | Status: DC
  Administered 2018-11-13 – 2018-11-16 (×4): 20 mg via ORAL
  Filled 2018-11-12 (×4): qty 1

## 2018-11-12 MED ORDER — METOPROLOL SUCCINATE ER 50 MG PO TB24
50.0000 mg | ORAL_TABLET | Freq: Every day | ORAL | Status: DC
  Administered 2018-11-14 – 2018-11-16 (×3): 50 mg via ORAL
  Filled 2018-11-12 (×4): qty 1

## 2018-11-12 MED ORDER — CALCIUM GLUCONATE-NACL 1-0.675 GM/50ML-% IV SOLN
1.0000 g | Freq: Once | INTRAVENOUS | Status: AC
  Administered 2018-11-12: 1000 mg via INTRAVENOUS
  Filled 2018-11-12: qty 50

## 2018-11-12 MED ORDER — ACETAMINOPHEN 650 MG RE SUPP: 650.0000 mg | Freq: Four times a day (QID) | RECTAL | Status: DC | PRN

## 2018-11-12 MED ORDER — ALLOPURINOL 100 MG PO TABS
100.0000 mg | ORAL_TABLET | Freq: Every evening | ORAL | Status: DC
  Administered 2018-11-12 – 2018-11-15 (×3): 100 mg via ORAL
  Filled 2018-11-12 (×4): qty 1

## 2018-11-12 MED ORDER — ONDANSETRON HCL 4 MG PO TABS: 4.0000 mg | ORAL_TABLET | Freq: Four times a day (QID) | ORAL | Status: DC | PRN

## 2018-11-12 MED ORDER — DEXTROSE 50 % IV SOLN
1.0000 | Freq: Once | INTRAVENOUS | Status: AC
  Administered 2018-11-12: 50 mL via INTRAVENOUS
  Filled 2018-11-12: qty 50

## 2018-11-12 MED ORDER — SODIUM BICARBONATE 8.4 % IV SOLN
50.0000 meq | Freq: Once | INTRAVENOUS | Status: AC
  Administered 2018-11-12: 50 meq via INTRAVENOUS
  Filled 2018-11-12: qty 50

## 2018-11-12 MED ORDER — CALCIUM ACETATE (PHOS BINDER) 667 MG PO CAPS
1334.0000 mg | ORAL_CAPSULE | Freq: Three times a day (TID) | ORAL | Status: DC
  Administered 2018-11-14 – 2018-11-16 (×4): 1334 mg via ORAL
  Filled 2018-11-12 (×7): qty 2

## 2018-11-12 MED ORDER — INSULIN ASPART 100 UNIT/ML ~~LOC~~ SOLN
10.0000 [IU] | Freq: Once | SUBCUTANEOUS | Status: AC
  Administered 2018-11-12: 10 [IU] via INTRAVENOUS
  Filled 2018-11-12: qty 1

## 2018-11-12 MED ORDER — VALACYCLOVIR HCL 500 MG PO TABS
500.0000 mg | ORAL_TABLET | ORAL | Status: DC
  Administered 2018-11-12 – 2018-11-14 (×2): 500 mg via ORAL
  Filled 2018-11-12 (×2): qty 1

## 2018-11-12 MED ORDER — MUPIROCIN 2 % EX OINT
1.0000 "application " | TOPICAL_OINTMENT | Freq: Every day | CUTANEOUS | Status: DC | PRN
  Filled 2018-11-12: qty 22

## 2018-11-12 MED ORDER — ONDANSETRON HCL 4 MG/2ML IJ SOLN: 4.0000 mg | Freq: Four times a day (QID) | INTRAMUSCULAR | Status: DC | PRN

## 2018-11-12 MED ORDER — SODIUM CHLORIDE 0.9 % IV SOLN
1.0000 g | Freq: Once | INTRAVENOUS | Status: DC
  Filled 2018-11-12: qty 10

## 2018-11-12 MED ORDER — ACETAMINOPHEN 325 MG PO TABS
650.0000 mg | ORAL_TABLET | Freq: Four times a day (QID) | ORAL | Status: DC | PRN
  Administered 2018-11-14 – 2018-11-16 (×2): 650 mg via ORAL
  Filled 2018-11-12 (×2): qty 2

## 2018-11-12 NOTE — Progress Notes (Signed)
[facility]  ROUNDING NOTE   Subjective:   Patient being admitted for small bowel obstruction.   Wife at bedside.  Last peritoneal dialysis was last night.   Objective:  Vital signs in last 24 hours:  Temp:  [97.5 F (36.4 C)] 97.5 F (36.4 C) (12/09 1434) Pulse Rate:  [100-111] 111 (12/09 1730) Resp:  [18-19] 19 (12/09 1730) BP: (110-133)/(84-87) 129/85 (12/09 1730) SpO2:  [98 %-100 %] 98 % (12/09 1730) Weight:  [77.1 kg] 77.1 kg (12/09 1426)  Weight change:  Filed Weights   11/12/18 1426  Weight: 77.1 kg    Intake/Output: No intake/output data recorded.   Intake/Output this shift:  Total I/O In: 500 [IV Piggyback:500] Out: -   Physical Exam: General: NAD,   Head: Normocephalic, atraumatic. Moist oral mucosal membranes  Eyes: Anicteric, PERRL  Neck: Supple, trachea midline  Lungs:  Clear to auscultation  Heart: Regular rate and rhythm  Abdomen:  Soft, nontender,   Extremities: no peripheral edema.  Neurologic: Nonfocal, moving all four extremities  Skin: No lesions  Access: PD catheter    Basic Metabolic Panel: Recent Labs  Lab 11/12/18 1539  NA 129*  K 6.0*  CL 90*  CO2 21*  GLUCOSE 171*  BUN 59*  CREATININE 10.39*  CALCIUM 9.4    Liver Function Tests: Recent Labs  Lab 11/12/18 1539  AST 23  ALT 18  ALKPHOS 115  BILITOT 1.0  PROT 7.4  ALBUMIN 4.8   Recent Labs  Lab 11/12/18 1539  LIPASE 62*   No results for input(s): AMMONIA in the last 168 hours.  CBC: Recent Labs  Lab 11/12/18 1539  WBC 18.0*  NEUTROABS 16.6*  HGB 13.6  HCT 41.1  MCV 99.5  PLT 309    Cardiac Enzymes: Recent Labs  Lab 11/12/18 1539  TROPONINI <0.03    BNP: Invalid input(s): POCBNP  CBG: No results for input(s): GLUCAP in the last 168 hours.  Microbiology: Results for orders placed or performed during the hospital encounter of 05/03/18  Blood Culture (routine x 2)     Status: None   Collection Time: 05/03/18  3:48 PM  Result  Value Ref Range Status   Specimen Description BLOOD LT [facility]  Final   Special Requests   Final    BOTTLES DRAWN AEROBIC AND ANAEROBIC Blood Culture adequate volume   Culture   Final    NO GROWTH 5 DAYS Performed at [facility], [address] [place], [place] [address]    Report Status 05/08/2018 FINAL  Final  Blood Culture (routine x 2)     Status: None   Collection Time: 05/03/18  3:57 PM  Result Value Ref Range Status   Specimen Description BLOOD RT [facility]  Final   Special Requests   Final    BOTTLES DRAWN AEROBIC AND ANAEROBIC Blood Culture adequate volume   Culture   Final    NO GROWTH 5 DAYS Performed at [facility], [address] [place], [place] [address]    Report Status 05/08/2018 FINAL  Final  Body fluid culture     Status: None   Collection Time: 05/03/18  4:23 PM  Result Value Ref Range Status   Specimen Description   Final    PERITONEAL CAVITY Performed at [facility], [address] [place], [place] [address]    Special Requests   Final    Normal Performed at [facility], [address] [place], [place] [address]  Gram Stain   Final    RARE WBC PRESENT, PREDOMINANTLY PMN NO ORGANISMS SEEN    Culture   Final    NO GROWTH 3 DAYS Performed at [place] [address], [place], [name] [address]    Report Status 05/07/2018 FINAL  Final    Coagulation Studies: No results for input(s): LABPROT, INR in the last 72 hours.  Urinalysis: No results for input(s): COLORURINE, LABSPEC, PHURINE, GLUCOSEU, HGBUR, BILIRUBINUR, KETONESUR, PROTEINUR, UROBILINOGEN, NITRITE, LEUKOCYTESUR in the last 72 hours.  Invalid input(s): APPERANCEUR    Imaging: Ct Abdomen Pelvis Wo Contrast  Result Date: 11/12/2018 CLINICAL DATA:  Abdominal pain. Vomiting. Chronic renal failure. EXAM: CT ABDOMEN AND PELVIS WITHOUT CONTRAST TECHNIQUE: Multidetector CT imaging of the abdomen and pelvis was performed following the standard  protocol without IV contrast. COMPARISON:  CT scan dated 05/03/2018 FINDINGS: Lower chest: No acute abnormality. Aortic atherosclerosis. Calcification of the aortic valve. Hepatobiliary: No focal liver abnormality is seen. No gallstones, gallbladder wall thickening, or biliary dilatation. Pancreas: Unremarkable. No pancreatic ductal dilatation or surrounding inflammatory changes. Spleen: Normal in size without focal abnormality. Adrenals/Urinary Tract: Normal adrenal glands. Chronic cystic changes and calcifications in both kidneys. No excretion of contrast from the kidneys. There is fluid in the bladder. Stomach/Bowel: There multiple dilated loops of proximal small bowel with a transition point in the right lower quadrant. Distal small bowel is not distended. Colon is normal. Stomach is slightly distended but the duodenum is not distended. Appendix is normal. Vascular/Lymphatic: Aortic atherosclerosis. No enlarged abdominal or pelvic lymph nodes. Reproductive: Prostate is unremarkable. Other: Small amount of fluid in the pelvis around the peritoneal dialysis catheter, the tip of which is in the mid pelvis. Musculoskeletal: No acute abnormality. Slight sclerosis of the bones consistent with the patient's history of chronic renal failure. IMPRESSION: Partial or early complete small bowel obstruction with a transition point in the right lower quadrant, similar to the appearance on the prior study of 05/03/2018. Aortic Atherosclerosis (ICD10-I70.0). Electronically Signed   By: Lorriane Shire M.D.   On: 11/12/2018 16:22     Medications:   . calcium gluconate 1,000 mg (11/12/18 1753)      Assessment/ Plan:  [name] is a 57 y.o. [place] male with end stage renal disease on peritoneal dialysis, gout, history of small bowel obstruction, herpes zoster infection   [facility] Matthew Mathis PD CCPD 9 hours 4 exchanges 2.8 liters. Last fill 2 liters with extraneal solution.   1.  ESRD on peritoneal dialysis.    Peritoneal dialysis for tonight. Orders prepared.  - hold last fill due to small bowel obstruction  2.  Hypertension: blood pressure at goal. Home regimen of benazepril, metoprolol and torsemide.   3.  Anemia of chronic kidney disease. Hemoglobin 13.6 - epo as outpatient.   4.  Secondary hyperparathyroidism.   - restart phos binders when restarting PO diet.    LOS: 0 Verdon Ferrante 12/9/20195:55 PM

## 2018-11-12 NOTE — Progress Notes (Signed)
Family Meeting Note  Advance Directive:yes  Today a meeting took place with the Patient.  Patient is able to participate   The following clinical team members were present during this meeting:MD  The following were discussed:Patient's diagnosis: sbo, Patient's progosis: Unable to determine and Goals for treatment: Full Code  Additional follow-up to be provided: prn  Time spent during discussion:20 minutes  Gorden Harms, MD

## 2018-11-12 NOTE — ED Triage Notes (Signed)
Pt c/o upper abdominal pain starting today. Is a PD patient.  Took 500 ML extra fluid off last night. Vomit X 1 today. No diarrhea. orhostatic with EMS 330 systolic to 91 laying to sitting.  No fevers.

## 2018-11-12 NOTE — ED Provider Notes (Signed)
[facility] Emergency Department Provider Note  ____________________________________________  Time seen: Approximately 5:33 PM  I have reviewed the triage vital signs and the nursing notes.   HISTORY  Chief Complaint Abdominal Pain   HPI Matthew Mathis is a 57 y.o. male with a history of ESRD on peritoneal dialysis, hypertension, SBO who presents for evaluation of abdominal pain.  Patient reports that the pain started 3 AM, diffuse and sharp, severe during the night.  He has had 2 episodes of nonbloody nonbilious emesis.  Reports that the pain improved after the second episode.  Had a normal bowel movement this morning.  No distention.  He is passing flatus.  He denies fever or chills.  He still makes urine and denies dysuria.  Reports undergoing normal peritoneal dialysis last night.  No chest pain or shortness of breath.   Past Medical History:  Diagnosis Date  . Chronic renal failure    hx   . Gout    hx  . HTN (hypertension)     Patient Active Problem List   Diagnosis Date Noted  . Encephalopathy acute 10/15/2018  . Small bowel obstruction ([place]) 05/03/2018  . End stage renal disease ([place]) 05/08/2017  . Complication of renal dialysis device 05/08/2017  . HYPERTENSION, BENIGN 10/01/2010  . CHEST PAIN UNSPECIFIED 10/01/2010    Past Surgical History:  Procedure Laterality Date  . CAPD INSERTION Right 05/24/2017   Procedure: LAPAROSCOPIC INSERTION CONTINUOUS AMBULATORY PERITONEAL DIALYSIS  (CAPD) CATHETER;  Surgeon: Katha Cabal, MD;  Location: [facility] ORS;  Service: Vascular;  Laterality: Right;  . DIALYSIS/PERMA CATHETER INSERTION N/A 05/07/2018   Procedure: DIALYSIS/PERMA CATHETER INSERTION;  Surgeon: Algernon Huxley, MD;  Location: [place] CV LAB;  Service: Cardiovascular;  Laterality: N/A;  . DIALYSIS/PERMA CATHETER REMOVAL N/A 06/27/2017   Procedure: Dialysis/Perma Catheter Removal;  Surgeon: Katha Cabal, MD;  Location: [place]  CV LAB;  Service: Cardiovascular;  Laterality: N/A;  . DIALYSIS/PERMA CATHETER REMOVAL N/A 07/18/2018   Procedure: DIALYSIS/PERMA CATHETER REMOVAL;  Surgeon: Katha Cabal, MD;  Location: [place] CV LAB;  Service: Cardiovascular;  Laterality: N/A;  . INSERTION OF DIALYSIS CATHETER Right 05/24/2017   Procedure: INSERTION OF DIALYSIS CATHETER ( TUNNELED CATH );  Surgeon: Katha Cabal, MD;  Location: [facility] ORS;  Service: Vascular;  Laterality: Right;  . PERITONEAL CATHETER INSERTION    . REMOVAL OF A DIALYSIS CATHETER Left 05/24/2017   Procedure: REMOVAL OF A DIALYSIS CATHETER ( PD CATH );  Surgeon: Katha Cabal, MD;  Location: [facility] ORS;  Service: Vascular;  Laterality: Left;  . RENAL BIOPSY     non specific     Prior to Admission medications   Medication Sig Start Date End Date Taking? Authorizing Provider  allopurinol (ZYLOPRIM) 100 MG tablet Take 100 mg by mouth every evening.  07/25/16  Yes [provider]  benazepril (LOTENSIN) 20 MG tablet Take 20 mg by mouth daily. 07/02/18  Yes [provider]  calcium acetate (PHOSLO) 667 MG capsule Take 667-1,334 mg by mouth See admin instructions. Take 1334 mg by mouth three times a day and take 667 mg by mouth with snacks.   Yes [provider]  cinacalcet (SENSIPAR) 30 MG tablet Take 90 mg by mouth 2 (two) times daily with a meal.    Yes [provider]  ketoconazole (NIZORAL) 2 % shampoo Apply 1 application topically 3 (three) times a week.  02/25/18  Yes [provider]  metoprolol succinate (TOPROL-XL)  50 MG 24 hr tablet Take 1 tablet (50 mg total) by mouth daily. Take with or immediately following a meal. 05/10/18  Yes Gouru, Aruna, MD  Multiple Vitamins-Minerals (DIALYVITE 800/ULTRA D) TABS Take 1 tablet by mouth daily. 05/22/17  Yes [provider]  mupirocin ointment (BACTROBAN) 2 % Apply 1 application topically daily as needed. Apply to exit site 11/08/18  Yes [provider]  torsemide (DEMADEX) 100 MG tablet Take 100 mg by mouth daily. 09/07/18  Yes [provider]  triamcinolone cream (KENALOG) 0.1 % Apply 1 application topically 2 (two) times daily as needed (for inflammation).    Yes [provider]  valACYclovir (VALTREX) 500 MG tablet Take 1 tablet (500 mg total) by mouth every other day for 15 doses. 10/20/18 11/18/18 Yes PyreddyReatha Harps, MD    Allergies Patient has no known allergies.  Family History  Problem Relation Age of Onset  . Diabetes Father   . CAD Father   . Hypertension Mother        brother, sister   . Kidney disease Mother     Social History Social History   Tobacco Use  . Smoking status: Former Smoker    Packs/day: 0.50    Types: Cigarettes    Last attempt to quit: 12/04/2010    Years since quitting: 7.9  . Smokeless tobacco: Never Used  . Tobacco comment: used to smoke 2-3 ppd, now smokes 3 cigarettes daily   Substance Use Topics  . Alcohol use: No  . Drug use: No    Review of Systems  Constitutional: Negative for fever. Eyes: Negative for visual changes. ENT: Negative for sore throat. Neck: No neck pain  Cardiovascular: Negative for chest pain. Respiratory: Negative for shortness of breath. Gastrointestinal: + abdominal pain, nausea, and vomiting. No diarrhea. Genitourinary: Negative for dysuria. Musculoskeletal: Negative for back pain. Skin: Negative for rash. Neurological: Negative for headaches, weakness or numbness. Psych: No SI or HI  ____________________________________________   PHYSICAL EXAM:  VITAL SIGNS: ED Triage Vitals  Enc Vitals Group     BP 11/12/18 1434 110/84     Pulse Rate 11/12/18 1434 100     Resp 11/12/18 1434 18     Temp 11/12/18 1434 (!) 97.5 F (36.4 C)     Temp Source 11/12/18 1434 Oral     SpO2 11/12/18 1434 100 %     Weight 11/12/18 1426 170 lb (77.1 kg)     Height 11/12/18 1426 5\' 6"  (1.676 m)     Head Circumference --      Peak Flow --        Pain Score 11/12/18 1425 10     Pain Loc --      Pain Edu? --      Excl. in [place]? --     Constitutional: Alert and oriented. Well appearing and in no apparent distress. HEENT:      Head: Normocephalic and atraumatic.         Eyes: Conjunctivae are normal. Sclera is non-icteric.       Mouth/Throat: Mucous membranes are moist.       Neck: Supple with no signs of meningismus. Cardiovascular: Regular rate and rhythm. No murmurs, gallops, or rubs. 2+ symmetrical distal pulses are present in all extremities. No JVD. Respiratory: Normal respiratory effort. Lungs are clear to auscultation bilaterally. No wheezes, crackles, or rhonchi.  Gastrointestinal: Soft, diffusely tender to palpation, and non distended with positive bowel sounds. No rebound or guarding. Musculoskeletal: Nontender with  normal range of motion in all extremities. No edema, cyanosis, or erythema of extremities. Neurologic: Normal speech and language. Face is symmetric. Moving all extremities. No gross focal neurologic deficits are appreciated. Skin: Skin is warm, dry and intact. No rash noted. Psychiatric: Mood and affect are normal. Speech and behavior are normal.  ____________________________________________   LABS (all labs ordered are listed, but only abnormal results are displayed)  Labs Reviewed  CBC WITH DIFFERENTIAL/PLATELET - Abnormal; Notable for the following components:      Result Value   WBC 18.0 (*)    RBC 4.13 (*)    RDW 17.3 (*)    Neutro Abs 16.6 (*)    Lymphs Abs 0.2 (*)    Abs Immature Granulocytes 0.09 (*)    All other components within normal limits  COMPREHENSIVE METABOLIC PANEL - Abnormal; Notable for the following components:   Sodium 129 (*)    Potassium 6.0 (*)    Chloride 90 (*)    CO2 21 (*)    Glucose, Bld 171 (*)    BUN 59 (*)    Creatinine, Ser 10.39 (*)    GFR calc non Af Amer 5 (*)    GFR calc Af Amer 6 (*)    Anion gap 18 (*)    All other components within normal limits   LIPASE, BLOOD - Abnormal; Notable for the following components:   Lipase 62 (*)    All other components within normal limits  TROPONIN I   ____________________________________________  EKG  ED ECG REPORT I, Rudene Re, the attending physician, personally viewed and interpreted this ECG.  Sinus tachycardia, rate of 101, normal intervals, normal axis, no ST elevations or depressions. ____________________________________________  RADIOLOGY  I have personally reviewed the images performed during this visit and I agree with the Radiologist's read.   Interpretation by Radiologist:  Ct Abdomen Pelvis Wo Contrast  Result Date: 11/12/2018 CLINICAL DATA:  Abdominal pain. Vomiting. Chronic renal failure. EXAM: CT ABDOMEN AND PELVIS WITHOUT CONTRAST TECHNIQUE: Multidetector CT imaging of the abdomen and pelvis was performed following the standard protocol without IV contrast. COMPARISON:  CT scan dated 05/03/2018 FINDINGS: Lower chest: No acute abnormality. Aortic atherosclerosis. Calcification of the aortic valve. Hepatobiliary: No focal liver abnormality is seen. No gallstones, gallbladder wall thickening, or biliary dilatation. Pancreas: Unremarkable. No pancreatic ductal dilatation or surrounding inflammatory changes. Spleen: Normal in size without focal abnormality. Adrenals/Urinary Tract: Normal adrenal glands. Chronic cystic changes and calcifications in both kidneys. No excretion of contrast from the kidneys. There is fluid in the bladder. Stomach/Bowel: There multiple dilated loops of proximal small bowel with a transition point in the right lower quadrant. Distal small bowel is not distended. Colon is normal. Stomach is slightly distended but the duodenum is not distended. Appendix is normal. Vascular/Lymphatic: Aortic atherosclerosis. No enlarged abdominal or pelvic lymph nodes. Reproductive: Prostate is unremarkable. Other: Small amount of fluid in the pelvis around the peritoneal  dialysis catheter, the tip of which is in the mid pelvis. Musculoskeletal: No acute abnormality. Slight sclerosis of the bones consistent with the patient's history of chronic renal failure. IMPRESSION: Partial or early complete small bowel obstruction with a transition point in the right lower quadrant, similar to the appearance on the prior study of 05/03/2018. Aortic Atherosclerosis (ICD10-I70.0). Electronically Signed   By: Lorriane Shire M.D.   On: 11/12/2018 16:22     ____________________________________________   PROCEDURES  Procedure(s) performed: None Procedures Critical Care performed: yes  CRITICAL CARE Performed by: [place]  Alfred Levins  ?  Total critical care time: 40 min  Critical care time was exclusive of separately billable procedures and treating other patients.  Critical care was necessary to treat or prevent imminent or life-threatening deterioration.  Critical care was time spent personally by me on the following activities: development of treatment plan with patient and/or surrogate as well as nursing, discussions with consultants, evaluation of patient's response to treatment, examination of patient, obtaining history from patient or surrogate, ordering and performing treatments and interventions, ordering and review of laboratory studies, ordering and review of radiographic studies, pulse oximetry and re-evaluation of patient's condition.  ____________________________________________   INITIAL IMPRESSION / ASSESSMENT AND PLAN / ED COURSE  56 y.o. male with a history of ESRD on peritoneal dialysis, hypertension, SBO who presents for evaluation of abdominal pain, nausea, and vomiting.  Differential diagnosis is broad and includes SBP versus SBO versus UTI versus pyelonephritis versus diverticulitis versus GERD versus gastritis.  On labs and CT are pending.  _________________________ 4:20 PM on 11/12/2018 -----------------------------------------  CT positive for  SBO.  Labs showing hyperkalemia with no EKG changes, anion gap metabolic acidosis, borderline low CO2, and hyponatremia.  Will initiate treatment for hyperkalemia.  Surgery and nephrology have been paged.  Clinical Course as of Nov 12 1737  Mon Nov 12, 2018  1629 Spoke with Dr. Hampton Abbot who is aware, recommended NG placement and admission to medicine due to electrolyte abnormalities and need for HD. Spoke with Dr. Juleen China who will plan to dialyse patient in a few hours when a bed at HD becomes available and also get peritoneal fluid sample for analysis to rule out infection. Will treat hyperkalemia with bicarbonate, D50, insulin, and genlte IVF. Discussed with hospitalist for admission,.   [CV]    Clinical Course User Index [CV] Alfred Levins [place], MD     As part of my medical decision making, I reviewed the following data within the [facility] notes reviewed and incorporated, Labs reviewed , EKG interpreted , Old EKG reviewed, Old chart reviewed, Radiograph reviewed , Discussed with admitting physician , A consult was requested and obtained from this/these consultant(s) surgery and nephrology, Notes from prior ED visits and [place] Controlled Substance Database    Pertinent labs & imaging results that were available during my care of the patient were reviewed by me and considered in my medical decision making (see chart for details).    ____________________________________________   FINAL CLINICAL IMPRESSION(S) / ED DIAGNOSES  Final diagnoses:  SBO (small bowel obstruction) (HCC)  Hyperkalemia  Hyponatremia  Increased anion gap metabolic acidosis      NEW MEDICATIONS STARTED DURING THIS VISIT:  ED Discharge Orders    None       Note:  This document was prepared using Dragon voice recognition software and may include unintentional dictation errors.    Alfred Levins, [place], MD 11/12/18 [phone]

## 2018-11-12 NOTE — H&P (Signed)
[facility] NAME: Matthew Mathis    MR#:  [id number]  DATE OF BIRTH:  1961/05/12  DATE OF ADMISSION:  11/12/2018  PRIMARY CARE PHYSICIAN: Alexis Goodell, MD   REQUESTING/REFERRING PHYSICIAN:   CHIEF COMPLAINT:   Chief Complaint  Patient presents with  . Abdominal Pain    HISTORY OF PRESENT ILLNESS: Matthew Mathis  is a 57 y.o. male with a known history per below which includes small bowel obstruction, presenting with acute abdominal pain that started at 3 AM this morning with associated nausea, emesis, and emergency room patient was found to have recurrent bowel obstruction on CT of the abdomen, white count 18,000, lipase 61, potassium 6, creatinine 10, sodium 128, noted tachycardia, patient evaluated in the emergency room, wife at the bedside, patient feeling much better since NG tube was placed for decompression, patient stated that he did have a bowel movement this morning and is passing some gas, patient is now being admitted for acute recurrent small bowel obstruction, chronic end-stage renal disease on peritoneal hemodialysis, and acute hyperkalemia  PAST MEDICAL HISTORY:   Past Medical History:  Diagnosis Date  . Chronic renal failure    hx   . Gout    hx  . HTN (hypertension)     PAST SURGICAL HISTORY:  Past Surgical History:  Procedure Laterality Date  . CAPD INSERTION Right 05/24/2017   Procedure: LAPAROSCOPIC INSERTION CONTINUOUS AMBULATORY PERITONEAL DIALYSIS  (CAPD) CATHETER;  Surgeon: Katha Cabal, MD;  Location: [facility];  Service: Vascular;  Laterality: Right;  . DIALYSIS/PERMA CATHETER INSERTION N/A 05/07/2018   Procedure: DIALYSIS/PERMA CATHETER INSERTION;  Surgeon: Algernon Huxley, MD;  Location: [facility];  Service: Cardiovascular;  Laterality: N/A;  . DIALYSIS/PERMA CATHETER REMOVAL N/A 06/27/2017   Procedure: Dialysis/Perma Catheter Removal;  Surgeon: Katha Cabal, MD;  Location: [facility];  Service: Cardiovascular;  Laterality: N/A;  . DIALYSIS/PERMA CATHETER REMOVAL N/A 07/18/2018   Procedure: DIALYSIS/PERMA CATHETER REMOVAL;  Surgeon: Katha Cabal, MD;  Location: [facility];  Service: Cardiovascular;  Laterality: N/A;  . INSERTION OF DIALYSIS CATHETER Right 05/24/2017   Procedure: INSERTION OF DIALYSIS CATHETER ( TUNNELED CATH );  Surgeon: Katha Cabal, MD;  Location: [facility];  Service: Vascular;  Laterality: Right;  . PERITONEAL CATHETER INSERTION    . REMOVAL OF A DIALYSIS CATHETER Left 05/24/2017   Procedure: REMOVAL OF A DIALYSIS CATHETER ( PD CATH );  Surgeon: Katha Cabal, MD;  Location: [facility];  Service: Vascular;  Laterality: Left;  . RENAL BIOPSY     non specific     SOCIAL HISTORY:  Social History   Tobacco Use  . Smoking status: Former Smoker    Packs/day: 0.50    Types: Cigarettes    Last attempt to quit: 12/04/2010    Years since quitting: 7.9  . Smokeless tobacco: Never Used  . Tobacco comment: used to smoke 2-3 ppd, now smokes 3 cigarettes daily   Substance Use Topics  . Alcohol use: No    FAMILY HISTORY:  Family History  Problem Relation Age of Onset  . Diabetes Father   . CAD Father   . Hypertension Mother        brother, sister   . Kidney disease Mother     DRUG ALLERGIES: No Known Allergies  REVIEW OF SYSTEMS:   CONSTITUTIONAL: No fever, fatigue or weakness.  EYES: No blurred or double vision.  EARS,  NOSE, AND THROAT: No tinnitus or ear pain.  RESPIRATORY: No cough, shortness of breath, wheezing or hemoptysis.  CARDIOVASCULAR: No chest pain, orthopnea, edema.  GASTROINTESTINAL: +nausea, vomiting, abdominal pain.  GENITOURINARY: No dysuria, hematuria.  ENDOCRINE: No polyuria, nocturia,  HEMATOLOGY: No anemia, easy bruising or bleeding SKIN: No rash or lesion. MUSCULOSKELETAL: No joint pain or arthritis.   NEUROLOGIC: No tingling, numbness, weakness.  PSYCHIATRY: No anxiety or depression.    MEDICATIONS AT HOME:  Prior to Admission medications   Medication Sig Start Date End Date Taking? Authorizing Provider  allopurinol (ZYLOPRIM) 100 MG tablet Take 100 mg by mouth every evening.  07/25/16  Yes [provider]  benazepril (LOTENSIN) 20 MG tablet Take 20 mg by mouth daily. 07/02/18  Yes [provider]  calcium acetate (PHOSLO) 667 MG capsule Take 667-1,334 mg by mouth See admin instructions. Take 1334 mg by mouth three times a day and take 667 mg by mouth with snacks.   Yes [provider]  cinacalcet (SENSIPAR) 30 MG tablet Take 90 mg by mouth 2 (two) times daily with a meal.    Yes [provider]  ketoconazole (NIZORAL) 2 % shampoo Apply 1 application topically 3 (three) times a week.  02/25/18  Yes [provider]  metoprolol succinate (TOPROL-XL) 50 MG 24 hr tablet Take 1 tablet (50 mg total) by mouth daily. Take with or immediately following a meal. 05/10/18  Yes Gouru, Aruna, MD  Multiple Vitamins-Minerals (DIALYVITE 800/ULTRA D) TABS Take 1 tablet by mouth daily. 05/22/17  Yes [provider]  mupirocin ointment (BACTROBAN) 2 % Apply 1 application topically daily as needed. Apply to exit site 11/08/18  Yes [provider]  torsemide (DEMADEX) 100 MG tablet Take 100 mg by mouth daily. 09/07/18  Yes [provider]  triamcinolone cream (KENALOG) 0.1 % Apply 1 application topically 2 (two) times daily as needed (for inflammation).    Yes [provider]  valACYclovir (VALTREX) 500 MG tablet Take 1 tablet (500 mg total) by mouth every other day for 15 doses. 10/20/18 11/18/18 Yes Pyreddy, Reatha Harps, MD      PHYSICAL EXAMINATION:   VITAL SIGNS: Blood pressure 111/80, pulse (!) 108, temperature (!) 97.5 F (36.4 C), temperature source Oral, resp. rate 18, height 5\' 6"  (1.676 m), weight 77.1 kg, SpO2 97 %.  GENERAL:  57 y.o.-year-old patient lying in the bed with no acute distress. patient lying in the bed with no acute distress.  EYES: Pupils equal,  round, reactive to light and accommodation. No scleral icterus. Extraocular muscles intact.  HEENT: Head atraumatic, normocephalic. Oropharynx and nasopharynx clear.  NECK:  Supple, no jugular venous distention. No thyroid enlargement, no tenderness.  LUNGS: Normal breath sounds bilaterally, no wheezing, rales,rhonchi or crepitation. No use of accessory muscles of respiration.  CARDIOVASCULAR: S1, S2 normal. No murmurs, rubs, or gallops.  ABDOMEN: Soft, nontender, nondistended. Bowel sounds present. No organomegaly or mass.  Peritoneal dialysis catheter noted EXTREMITIES: No pedal edema, cyanosis, or clubbing.  NEUROLOGIC: Cranial nerves II through XII are intact. Muscle strength 5/5 in all extremities. Sensation intact. Gait not checked.  PSYCHIATRIC: The patient is alert and oriented x 3.  SKIN: No obvious rash, lesion, or ulcer.   LABORATORY PANEL:   CBC Recent Labs  Lab 11/12/18 1539  WBC 18.0*  HGB 13.6  HCT 41.1  PLT 309  MCV 99.5  MCH 32.9  MCHC 33.1  RDW 17.3*  LYMPHSABS 0.2*  MONOABS 1.0  EOSABS 0.2  BASOSABS 0.1   ------------------------------------------------------------------------------------------------------------------  Chemistries  Recent Labs  Lab 11/12/18 1539  NA 129*  K 6.0*  CL 90*  CO2 21*  GLUCOSE 171*  BUN 59*  CREATININE 10.39*  CALCIUM 9.4  AST 23  ALT 18  ALKPHOS 115  BILITOT 1.0   ------------------------------------------------------------------------------------------------------------------ estimated creatinine clearance is 7.6 mL/min (A) (by C-G formula based on SCr of 10.39 mg/dL (H)). ------------------------------------------------------------------------------------------------------------------ No results for input(s): TSH, T4TOTAL, T3FREE, THYROIDAB in the last 72 hours.  Invalid input(s): FREET3   Coagulation profile No results for input(s): INR, PROTIME in the last 168  hours. ------------------------------------------------------------------------------------------------------------------- No results for input(s): DDIMER in the last 72 hours. -------------------------------------------------------------------------------------------------------------------  Cardiac Enzymes Recent Labs  Lab 11/12/18 1539  TROPONINI <0.03   ------------------------------------------------------------------------------------------------------------------ Invalid input(s): POCBNP  ---------------------------------------------------------------------------------------------------------------  Urinalysis    Component Value Date/Time   COLORURINE STRAW (A) 10/15/2018 2329   APPEARANCEUR CLEAR (A) 10/15/2018 2329   APPEARANCEUR Clear 01/02/2013 1611   LABSPEC 1.009 10/15/2018 2329   LABSPEC 1.006 01/02/2013 1611   PHURINE 8.0 10/15/2018 2329   GLUCOSEU 150 (A) 10/15/2018 2329   GLUCOSEU 50 mg/dL 01/02/2013 1611   HGBUR SMALL (A) 10/15/2018 2329   BILIRUBINUR NEGATIVE 10/15/2018 2329   BILIRUBINUR Negative 01/02/2013 1611   KETONESUR NEGATIVE 10/15/2018 2329   PROTEINUR 30 (A) 10/15/2018 2329   NITRITE NEGATIVE 10/15/2018 2329   LEUKOCYTESUR NEGATIVE 10/15/2018 2329   LEUKOCYTESUR Negative 01/02/2013 1611     RADIOLOGY: Ct Abdomen Pelvis Wo Contrast  Result Date: 11/12/2018 CLINICAL DATA:  Abdominal pain. Vomiting. Chronic renal failure. EXAM: CT ABDOMEN AND PELVIS WITHOUT CONTRAST TECHNIQUE: Multidetector CT imaging of the abdomen and pelvis was performed following the standard protocol without IV contrast. COMPARISON:  CT scan dated 05/03/2018 FINDINGS: Lower chest: No acute abnormality. Aortic atherosclerosis. Calcification of the aortic valve. Hepatobiliary: No focal liver abnormality is seen. No gallstones, gallbladder wall thickening, or biliary dilatation. Pancreas: Unremarkable. No pancreatic ductal dilatation or surrounding inflammatory changes. Spleen:  Normal in size without focal abnormality. Adrenals/Urinary Tract: Normal adrenal glands. Chronic cystic changes and calcifications in both kidneys. No excretion of contrast from the kidneys. There is fluid in the bladder. Stomach/Bowel: There multiple dilated loops of proximal small bowel with a transition point in the right lower quadrant. Distal small bowel is not distended. Colon is normal. Stomach is slightly distended but the duodenum is not distended. Appendix is normal. Vascular/Lymphatic: Aortic atherosclerosis. No enlarged abdominal or pelvic lymph nodes. Reproductive: Prostate is unremarkable. Other: Small amount of fluid in the pelvis around the peritoneal dialysis catheter, the tip of which is in the mid pelvis. Musculoskeletal: No acute abnormality. Slight sclerosis of the bones consistent with the patient's history of chronic renal failure. IMPRESSION: Partial or early complete small bowel obstruction with a transition point in the right lower quadrant, similar to the appearance on the prior study of 05/03/2018. Aortic Atherosclerosis (ICD10-I70.0). Electronically Signed   By: Lorriane Shire M.D.   On: 11/12/2018 16:22    EKG: Orders placed or performed during the hospital encounter of 11/12/18  . EKG 12-Lead  . EKG 12-Lead  . ED EKG  . ED EKG    IMPRESSION AND PLAN: *Acute recurrent small bowel obstruction Admit to regular nursing for bed, empiric Rocephin/Flagyl for now given chronic peritoneal dialysis catheter placement-concern for bacterial translocation in the setting of small bowel obstruction, follow-up on cultures, adult pain protocol, general surgery consulted for expert opinion, NG tube for decompression  *Chronic end-stage renal disease on peritoneal dialysis Nephrology consulted for peritoneal dialysis needs  *Acute hyperkalemia Treated  in the emergency room with calcium gluconate, IV insulin, sodium bicarb, BMP in the morning  *Acute  hyponatremia/hypochloremia Secondary to above Gentle IV fluids for rehydration  *Chronic benign essential hypertension Stable Continue home regiment     All the records are reviewed and case discussed with ED provider. Management plans discussed with the patient, family and they are in agreement.  CODE STATUS:full Code Status History    Date Active Date Inactive Code Status Order ID Comments User Context   10/15/2018 1844 10/20/2018 1528 Full Code [id number]  Gorden Harms, MD Inpatient   05/03/2018 1749 05/09/2018 1842 Full Code [id number]  Loletha Grayer, MD ED       TOTAL TIME TAKING CARE OF THIS PATIENT: 31minutes.    Avel Peace Milderd Manocchio M.D on 11/12/2018   Between 7am to 6pm - Pager - [phone]  After 6pm go to [email or web address] - password [email or web address] [facility]  Office  [phone]  CC: Primary care physician; Alexis Goodell, MD   Note: This dictation was prepared with Dragon dictation along with smaller phrase technology. Any transcriptional errors that result from this process are unintentional.

## 2018-11-12 NOTE — Progress Notes (Signed)
PD tx start. Pre-PD weight 71.9kg, exit site care performed. Fluid culture and fluid sample cell count obtained.    11/12/18 2157  Cycler Setup  Total Number of Exchanges 4  Fill Volume 2800  Dianeal Solution Dextrose 1.5% in 6000 mL  Last Bag Alarm 0  Fill Time - Minute(s) 10  Drain Time - Minute(s) 20 mins  Completion  Exit Site Care Performed Yes  Treatment Status Started  Fluid Balance - CCPD  Total Intake for Exchanges (mL) 11200 ml  Education / Care Plan  Dialysis Education Provided Yes  Documented Education in Care Plan Yes  Hand-Off documentation  Report given to (Full Name) Vonna Drafts  Report received from (Full Name) Stark Bray

## 2018-11-12 NOTE — Consult Note (Signed)
Date of Consultation:  11/12/2018  Requesting Physician:  Rudene Re, MD  Reason for Consultation:  Small bowel obstruction  History of Present Illness: Matthew Mathis is a 57 y.o. male with history of ESRD on peritoneal dialysis, presents with a one day history of abdominal pain that started overnight last night.  He has a history of prior small bowel obstruction in 04/2018 which was treated conservatively.  He reports that pain was sharp and associated with nausea and emesis.  He had an episode at home and one in the ED.  Pain does not radiate anywhere else, and he had been using he PD catheter without issues.  He reports he had flatus in the ED and had a BM this morning.  On workup, he had labs and CT scan done.  His WBC is 18 but he appears hemoconcentrated.  His K is 6.0 and was given medications to decrease it in the ED.  His CT scan shows dilated loops of small bowel with decompressed distal ileum.  The transition point appears to be in the right lower quadrant.  His PD catheter goes to the pelvis.  I have independently viewed the patient's imaging study and reviewed his laboratory studies.  NG tube was placed in ED and feels better by the time I evaluated the patient.  Past Medical History: Past Medical History:  Diagnosis Date  . Chronic renal failure    hx   . Gout    hx  . HTN (hypertension)      Past Surgical History: Past Surgical History:  Procedure Laterality Date  . CAPD INSERTION Right 05/24/2017   Procedure: LAPAROSCOPIC INSERTION CONTINUOUS AMBULATORY PERITONEAL DIALYSIS  (CAPD) CATHETER;  Surgeon: Katha Cabal, MD;  Location: [facility];  Service: Vascular;  Laterality: Right;  . DIALYSIS/PERMA CATHETER INSERTION N/A 05/07/2018   Procedure: DIALYSIS/PERMA CATHETER INSERTION;  Surgeon: Algernon Huxley, MD;  Location: [facility];  Service: Cardiovascular;  Laterality: N/A;  . DIALYSIS/PERMA CATHETER REMOVAL N/A 06/27/2017   Procedure: Dialysis/Perma Catheter  Removal;  Surgeon: Katha Cabal, MD;  Location: [facility];  Service: Cardiovascular;  Laterality: N/A;  . DIALYSIS/PERMA CATHETER REMOVAL N/A 07/18/2018   Procedure: DIALYSIS/PERMA CATHETER REMOVAL;  Surgeon: Katha Cabal, MD;  Location: [facility];  Service: Cardiovascular;  Laterality: N/A;  . INSERTION OF DIALYSIS CATHETER Right 05/24/2017   Procedure: INSERTION OF DIALYSIS CATHETER ( TUNNELED CATH );  Surgeon: Katha Cabal, MD;  Location: [facility];  Service: Vascular;  Laterality: Right;  . PERITONEAL CATHETER INSERTION    . REMOVAL OF A DIALYSIS CATHETER Left 05/24/2017   Procedure: REMOVAL OF A DIALYSIS CATHETER ( PD CATH );  Surgeon: Katha Cabal, MD;  Location: [facility];  Service: Vascular;  Laterality: Left;  . RENAL BIOPSY     non specific     Home Medications: Prior to Admission medications   Medication Sig Start Date End Date Taking? Authorizing Provider  allopurinol (ZYLOPRIM) 100 MG tablet Take 100 mg by mouth every evening.  07/25/16  Yes [provider]  benazepril (LOTENSIN) 20 MG tablet Take 20 mg by mouth daily. 07/02/18  Yes [provider]  calcium acetate (PHOSLO) 667 MG capsule Take 667-1,334 mg by mouth See admin instructions. Take 1334 mg by mouth three times a day and take 667 mg by mouth with snacks.   Yes [provider]  cinacalcet (SENSIPAR) 30 MG tablet Take 90 mg by mouth 2 (two) times daily  with a meal.    Yes [provider]  ketoconazole (NIZORAL) 2 % shampoo Apply 1 application topically 3 (three) times a week.  02/25/18  Yes [provider]  metoprolol succinate (TOPROL-XL) 50 MG 24 hr tablet Take 1 tablet (50 mg total) by mouth daily. Take with or immediately following a meal. 05/10/18  Yes Gouru, Aruna, MD  Multiple Vitamins-Minerals (DIALYVITE 800/ULTRA D) TABS Take 1 tablet by mouth daily. 05/22/17  Yes [provider]  mupirocin ointment (BACTROBAN) 2 % Apply 1  application topically daily as needed. Apply to exit site 11/08/18  Yes [provider]  torsemide (DEMADEX) 100 MG tablet Take 100 mg by mouth daily. 09/07/18  Yes [provider]  triamcinolone cream (KENALOG) 0.1 % Apply 1 application topically 2 (two) times daily as needed (for inflammation).    Yes [provider]  valACYclovir (VALTREX) 500 MG tablet Take 1 tablet (500 mg total) by mouth every other day for 15 doses. 10/20/18 11/18/18 Yes Pyreddy, Reatha Harps, MD    Allergies: No Known Allergies  Social History:  reports that he quit smoking about 7 years ago. His smoking use included cigarettes. He smoked 0.50 packs per day. He has never used smokeless tobacco. He reports that he does not drink alcohol or use drugs.   Family History: Family History  Problem Relation Age of Onset  . Diabetes Father   . CAD Father   . Hypertension Mother        brother, sister   . Kidney disease Mother     Review of Systems: Review of Systems  Constitutional: Negative for chills and fever.  HENT: Negative for hearing loss.   Respiratory: Negative for shortness of breath.   Cardiovascular: Negative for chest pain.  Gastrointestinal: Positive for abdominal pain, nausea and vomiting. Negative for blood in stool, constipation and diarrhea.  Genitourinary: Negative for dysuria.  Musculoskeletal: Negative for myalgias.  Skin: Positive for rash (shingles rash left shoulder).  Neurological: Negative for dizziness.  Psychiatric/Behavioral: Negative for depression.    Physical Exam BP 106/70   Pulse (!) 103   Temp (!) 97.5 F (36.4 C) (Oral)   Resp 15   Ht 5\' 6"  (1.676 m)   Wt 77.1 kg   SpO2 98%   BMI 27.44 kg/m  CONSTITUTIONAL: No acute distress HEENT:  Normocephalic, atraumatic, extraocular motion intact. NECK: Trachea is midline, and there is no jugular venous distension. RESPIRATORY:  Lungs are clear, and breath sounds are equal bilaterally. Normal respiratory  effort without pathologic use of accessory muscles. CARDIOVASCULAR: Heart is regular without murmurs, gallops, or rubs. GI: The abdomen is soft, mildly distended, currently non-tender to palpation.  PD catheter located in the right lower quadrant.  Prior incisions well healed. NG tube in place to suction with bilious/gastric contents. MUSCULOSKELETAL:  Normal muscle strength and tone in all four extremities.  No peripheral edema or cyanosis. SKIN: Skin turgor is normal. There are no pathologic skin lesions.  NEUROLOGIC:  Motor and sensation is grossly normal.  Cranial nerves are grossly intact. PSYCH:  Alert and oriented to person, place and time. Affect is normal.  Laboratory Analysis: Results for orders placed or performed during the hospital encounter of 11/12/18 (from the past 24 hour(s))  CBC with Differential/Platelet     Status: Abnormal   Collection Time: 11/12/18  3:39 PM  Result Value Ref Range   WBC 18.0 (H) 4.0 - 10.5 K/uL   RBC 4.13 (L) 4.22 - 5.81 MIL/uL  Hemoglobin 13.6 13.0 - 17.0 g/dL   HCT 41.1 39.0 - 52.0 %   MCV 99.5 80.0 - 100.0 fL   MCH 32.9 26.0 - 34.0 pg   MCHC 33.1 30.0 - 36.0 g/dL   RDW 17.3 (H) 11.5 - 15.5 %   Platelets 309 150 - 400 K/uL   nRBC 0.0 0.0 - 0.2 %   Neutrophils Relative % 92 %   Neutro Abs 16.6 (H) 1.7 - 7.7 K/uL   Lymphocytes Relative 1 %   Lymphs Abs 0.2 (L) 0.7 - 4.0 K/uL   Monocytes Relative 5 %   Monocytes Absolute 1.0 0.1 - 1.0 K/uL   Eosinophils Relative 1 %   Eosinophils Absolute 0.2 0.0 - 0.5 K/uL   Basophils Relative 0 %   Basophils Absolute 0.1 0.0 - 0.1 K/uL   Immature Granulocytes 1 %   Abs Immature Granulocytes 0.09 (H) 0.00 - 0.07 K/uL  Comprehensive metabolic panel     Status: Abnormal   Collection Time: 11/12/18  3:39 PM  Result Value Ref Range   Sodium 129 (L) 135 - 145 mmol/L   Potassium 6.0 (H) 3.5 - 5.1 mmol/L   Chloride 90 (L) 98 - 111 mmol/L   CO2 21 (L) 22 - 32 mmol/L   Glucose, Bld 171 (H) 70 - 99 mg/dL    BUN 59 (H) 6 - 20 mg/dL   Creatinine, Ser 10.39 (H) 0.61 - 1.24 mg/dL   Calcium 9.4 8.9 - 10.3 mg/dL   Total Protein 7.4 6.5 - 8.1 g/dL   Albumin 4.8 3.5 - 5.0 g/dL   AST 23 15 - 41 U/L   ALT 18 0 - 44 U/L   Alkaline Phosphatase 115 38 - 126 U/L   Total Bilirubin 1.0 0.3 - 1.2 mg/dL   GFR calc non Af Amer 5 (L) >60 mL/min   GFR calc Af Amer 6 (L) >60 mL/min   Anion gap 18 (H) 5 - 15  Lipase, blood     Status: Abnormal   Collection Time: 11/12/18  3:39 PM  Result Value Ref Range   Lipase 62 (H) 11 - 51 U/L  Troponin I - ONCE - STAT     Status: None   Collection Time: 11/12/18  3:39 PM  Result Value Ref Range   Troponin I <0.03 <0.03 ng/mL    Imaging: Ct Abdomen Pelvis Wo Contrast  Result Date: 11/12/2018 CLINICAL DATA:  Abdominal pain. Vomiting. Chronic renal failure. EXAM: CT ABDOMEN AND PELVIS WITHOUT CONTRAST TECHNIQUE: Multidetector CT imaging of the abdomen and pelvis was performed following the standard protocol without IV contrast. COMPARISON:  CT scan dated 05/03/2018 FINDINGS: Lower chest: No acute abnormality. Aortic atherosclerosis. Calcification of the aortic valve. Hepatobiliary: No focal liver abnormality is seen. No gallstones, gallbladder wall thickening, or biliary dilatation. Pancreas: Unremarkable. No pancreatic ductal dilatation or surrounding inflammatory changes. Spleen: Normal in size without focal abnormality. Adrenals/Urinary Tract: Normal adrenal glands. Chronic cystic changes and calcifications in both kidneys. No excretion of contrast from the kidneys. There is fluid in the bladder. Stomach/Bowel: There multiple dilated loops of proximal small bowel with a transition point in the right lower quadrant. Distal small bowel is not distended. Colon is normal. Stomach is slightly distended but the duodenum is not distended. Appendix is normal. Vascular/Lymphatic: Aortic atherosclerosis. No enlarged abdominal or pelvic lymph nodes. Reproductive: Prostate is unremarkable.  Other: Small amount of fluid in the pelvis around the peritoneal dialysis catheter, the tip of which is in the mid  pelvis. Musculoskeletal: No acute abnormality. Slight sclerosis of the bones consistent with the patient's history of chronic renal failure. IMPRESSION: Partial or early complete small bowel obstruction with a transition point in the right lower quadrant, similar to the appearance on the prior study of 05/03/2018. Aortic Atherosclerosis (ICD10-I70.0). Electronically Signed   By: Lorriane Shire M.D.   On: 11/12/2018 16:22    Assessment and Plan: This is a 57 y.o. male with small bowel obstruction.  Discussed with the patient that the fact that he's feeling better with the NG tube in place is reassuring, and the fact that he had flatus and BM today may be signs of only a partial obstruction.  NG tube will remain in place while awaiting full return of bowel function.  On today's CT scan, the PD catheter is not in the same location as the transition point of obstruction, and I do not see any contraindication at this point to continue PD.  Informed the patient that for now we will treat him conservatively with NG tube, NPO, and gentle IV hydration.  He does understand that if he does not improve, he may require surgery.  If this is the case, then he would require vascular access for hemodialysis for approximately 4 weeks to allow the midline incision to heal.  At this point, there is no urgent surgical need and PD can continue.  He understands this plan and all of his questions have been answered.  Face-to-face time spent with the patient and care providers was 80 minutes, with more than 50% of the time spent counseling, educating, and coordinating care of the patient.     Melvyn Neth, MD [facility]g:  [phone]

## 2018-11-13 LAB — CBC
HEMATOCRIT: 35.7 % — AB (ref 39.0–52.0)
HEMOGLOBIN: 11.7 g/dL — AB (ref 13.0–17.0)
MCH: 32.6 pg (ref 26.0–34.0)
MCHC: 32.8 g/dL (ref 30.0–36.0)
MCV: 99.4 fL (ref 80.0–100.0)
Platelets: 259 10*3/uL (ref 150–400)
RBC: 3.59 MIL/uL — ABNORMAL LOW (ref 4.22–5.81)
RDW: 17.4 % — ABNORMAL HIGH (ref 11.5–15.5)
WBC: 14 10*3/uL — AB (ref 4.0–10.5)
nRBC: 0 % (ref 0.0–0.2)

## 2018-11-13 LAB — BODY FLUID CELL COUNT WITH DIFFERENTIAL
Eos, Fluid: 1 %
LYMPHS FL: 30 %
Monocyte-Macrophage-Serous Fluid: 51 %
Neutrophil Count, Fluid: 13 %
Other Cells, Fluid: 5 %
Total Nucleated Cell Count, Fluid: 66 cu mm

## 2018-11-13 LAB — BASIC METABOLIC PANEL
Anion gap: 13 (ref 5–15)
BUN: 61 mg/dL — ABNORMAL HIGH (ref 6–20)
CO2: 28 mmol/L (ref 22–32)
Calcium: 8.4 mg/dL — ABNORMAL LOW (ref 8.9–10.3)
Chloride: 94 mmol/L — ABNORMAL LOW (ref 98–111)
Creatinine, Ser: 9.57 mg/dL — ABNORMAL HIGH (ref 0.61–1.24)
GFR calc Af Amer: 6 mL/min — ABNORMAL LOW (ref >60)
GFR calc non Af Amer: 5 mL/min — ABNORMAL LOW (ref >60)
Glucose, Bld: 158 mg/dL — ABNORMAL HIGH (ref 70–99)
Potassium: 5.4 mmol/L — ABNORMAL HIGH (ref 3.5–5.1)
Sodium: 135 mmol/L (ref 135–145)

## 2018-11-13 LAB — PATHOLOGIST SMEAR REVIEW

## 2018-11-13 MED ORDER — MORPHINE SULFATE (PF) 2 MG/ML IV SOLN: 1.0000 mg | Freq: Four times a day (QID) | INTRAVENOUS | Status: DC | PRN

## 2018-11-13 NOTE — Care Management (Signed)
Matthew Mathis dialysis liaison notified of admission.    

## 2018-11-13 NOTE — Progress Notes (Signed)
[place] [facility] [place] NAME: Matthew Mathis    MR#:  [id number]  DATE OF BIRTH:  09-15-1961  SUBJECTIVE:  patient came in with generalized abdominal pain. He had a BM and past flatus yesterday. He feels better after NG tube was placed. Wife in the room. No fever. No vomiting. NG 400 cc REVIEW OF SYSTEMS:   Review of Systems  Constitutional: Negative for chills, fever and weight loss.  HENT: Negative for ear discharge, ear pain and nosebleeds.   Eyes: Negative for blurred vision, pain and discharge.  Respiratory: Negative for sputum production, shortness of breath, wheezing and stridor.   Cardiovascular: Negative for chest pain, palpitations, orthopnea and PND.  Gastrointestinal: Positive for abdominal pain. Negative for diarrhea, nausea and vomiting.  Genitourinary: Negative for frequency and urgency.  Musculoskeletal: Negative for back pain and joint pain.  Neurological: Negative for sensory change, speech change, focal weakness and weakness.  Psychiatric/Behavioral: Negative for depression and hallucinations. The patient is not nervous/anxious.    Tolerating Diet:npo  Tolerating PT: not needed  DRUG ALLERGIES:  No Known Allergies  VITALS:  Blood pressure 94/71, pulse 98, temperature 97.7 F (36.5 C), temperature source Oral, resp. rate 17, height 5\' 6"  (1.676 m), weight 71.9 kg, SpO2 97 %.  PHYSICAL EXAMINATION:   Physical Exam  GENERAL:  57 y.o.-year-old patient lying in the bed with no acute distress.  EYES: Pupils equal, round, reactive to light and accommodation. No scleral icterus. Extraocular muscles intact.  HEENT: Head atraumatic, normocephalic. Oropharynx and nasopharynx clear. NG+ NECK:  Supple, no jugular venous distention. No thyroid enlargement, no tenderness.  LUNGS: Normal breath sounds bilaterally, no wheezing, rales, rhonchi. No use of accessory muscles of respiration.  CARDIOVASCULAR: S1, S2 normal. No murmurs,  rubs, or gallops.  ABDOMEN: Soft, nontender, nondistended. few Bowel sounds present. No organomegaly or mass.  EXTREMITIES: No cyanosis, clubbing or edema b/l.    NEUROLOGIC: Cranial nerves II through XII are intact. No focal Motor or sensory deficits b/l.   PSYCHIATRIC:  patient is alert and oriented x 3.  SKIN: No obvious rash, lesion, or ulcer.   LABORATORY PANEL:  CBC Recent Labs  Lab 11/13/18 0426  WBC 14.0*  HGB 11.7*  HCT 35.7*  PLT 259    Chemistries  Recent Labs  Lab 11/12/18 1539 11/13/18 0426  NA 129* 135  K 6.0* 5.4*  CL 90* 94*  CO2 21* 28  GLUCOSE 171* 158*  BUN 59* 61*  CREATININE 10.39* 9.57*  CALCIUM 9.4 8.4*  AST 23  --   ALT 18  --   ALKPHOS 115  --   BILITOT 1.0  --    Cardiac Enzymes Recent Labs  Lab 11/12/18 1539  TROPONINI <0.03   RADIOLOGY:  Ct Abdomen Pelvis Wo Contrast  Result Date: 11/12/2018 CLINICAL DATA:  Abdominal pain. Vomiting. Chronic renal failure. EXAM: CT ABDOMEN AND PELVIS WITHOUT CONTRAST TECHNIQUE: Multidetector CT imaging of the abdomen and pelvis was performed following the standard protocol without IV contrast. COMPARISON:  CT scan dated 05/03/2018 FINDINGS: Lower chest: No acute abnormality. Aortic atherosclerosis. Calcification of the aortic valve. Hepatobiliary: No focal liver abnormality is seen. No gallstones, gallbladder wall thickening, or biliary dilatation. Pancreas: Unremarkable. No pancreatic ductal dilatation or surrounding inflammatory changes. Spleen: Normal in size without focal abnormality. Adrenals/Urinary Tract: Normal adrenal glands. Chronic cystic changes and calcifications in both kidneys. No excretion of contrast from the kidneys. There is fluid in the bladder. Stomach/Bowel: There  multiple dilated loops of proximal small bowel with a transition point in the right lower quadrant. Distal small bowel is not distended. Colon is normal. Stomach is slightly distended but the duodenum is not distended. Appendix is  normal. Vascular/Lymphatic: Aortic atherosclerosis. No enlarged abdominal or pelvic lymph nodes. Reproductive: Prostate is unremarkable. Other: Small amount of fluid in the pelvis around the peritoneal dialysis catheter, the tip of which is in the mid pelvis. Musculoskeletal: No acute abnormality. Slight sclerosis of the bones consistent with the patient's history of chronic renal failure. IMPRESSION: Partial or early complete small bowel obstruction with a transition point in the right lower quadrant, similar to the appearance on the prior study of 05/03/2018. Aortic Atherosclerosis (ICD10-I70.0). Electronically Signed   By: Lorriane Shire M.D.   On: 11/12/2018 16:22   ASSESSMENT AND PLAN:  Matthew Mathis  is a 57 y.o. male with a known history per below which includes small bowel obstruction, presenting with acute abdominal pain that started at 3 AM this morning with associated nausea, emesis  *recurrent small bowel obstruction--last episode in May 2019--treated conservatively -seen by surgery. Continue conservative treatment. Patient feel better. - NG tube for decompression -peritoneal fluid no infection. Discontinue antibiotics. This was discussed with Dr. Juleen China.  *Chronic end-stage renal disease on peritoneal dialysis Nephrology consulted for peritoneal dialysis needs  *Acute hyperkalemia Treated in the emergency room with calcium gluconate, IV insulin, sodium bicarb, BMP in the morning -potassium better.  *Acute hyponatremia/hypochloremia Secondary to above received IV fluids for rehydration  *Chronic benign essential hypertension Stable Continue home regiment   Case discussed with Care Management/Social Worker. Management plans discussed with the patient, family and they are in agreement.  CODE STATUS: full  DVT Prophylaxis: heparin  TOTAL TIME TAKING CARE OF THIS PATIENT: *30* minutes.  >50% time spent on counselling and coordination of care  POSSIBLE D/C IN *few*  DAYS, DEPENDING ON CLINICAL CONDITION.  Note: This dictation was prepared with Dragon dictation along with smaller phrase technology. Any transcriptional errors that result from this process are unintentional.  Fritzi Mandes M.D on 11/13/2018 at 11:50 AM  Between 7am to 6pm - Pager - [phone]  After 6pm go to [email or web address] - password [email or web address] [facility]  Office  [phone]  CC: Primary care physician; Alexis Goodell, MDPatient ID: Benard Halsted, male   DOB: [birth year], 57 y.o.   MRN: [id number]

## 2018-11-13 NOTE — Progress Notes (Signed)
[facility]  ROUNDING NOTE   Subjective:   Patient with peritoneal dialysis treatment overnight. Tolerated treatment well. UF negative. 1.5% dextrose  Wife at bedside.  BM last night  Objective:  Vital signs in last 24 hours:  Temp:  [97.5 F (36.4 C)-97.7 F (36.5 C)] 97.7 F (36.5 C) (12/10 0520) Pulse Rate:  [98-111] 98 (12/10 0520) Resp:  [15-21] 17 (12/10 0520) BP: (94-133)/(70-87) 94/71 (12/10 0520) SpO2:  [97 %-100 %] 97 % (12/10 0520) Weight:  [71.9 kg-77.1 kg] 71.9 kg (12/10 0500)  Weight change:  Filed Weights   11/12/18 1426 11/13/18 0500  Weight: 77.1 kg 71.9 kg    Intake/Output: I/O last 3 completed shifts: In: 12073.1 [I.V.:123.1; Other:11200; IV Piggyback:750] Out: 400 [Emesis/NG output:400]   Intake/Output this shift:  No intake/output data recorded.  Physical Exam: General: NAD,   Head: NGT tube to suction  Eyes: Anicteric, PERRL  Neck: Supple, trachea midline  Lungs:  Clear to auscultation  Heart: Regular rate and rhythm  Abdomen:  Soft, nontender,   Extremities: no peripheral edema.  Neurologic: Nonfocal, moving all four extremities  Skin: No lesions  Access: PD catheter    Basic Metabolic Panel: Recent Labs  Lab 11/12/18 1539 11/13/18 0426  NA 129* 135  K 6.0* 5.4*  CL 90* 94*  CO2 21* 28  GLUCOSE 171* 158*  BUN 59* 61*  CREATININE 10.39* 9.57*  CALCIUM 9.4 8.4*    Liver Function Tests: Recent Labs  Lab 11/12/18 1539  AST 23  ALT 18  ALKPHOS 115  BILITOT 1.0  PROT 7.4  ALBUMIN 4.8   Recent Labs  Lab 11/12/18 1539  LIPASE 62*   No results for input(s): AMMONIA in the last 168 hours.  CBC: Recent Labs  Lab 11/12/18 1539 11/13/18 0426  WBC 18.0* 14.0*  NEUTROABS 16.6*  --   HGB 13.6 11.7*  HCT 41.1 35.7*  MCV 99.5 99.4  PLT 309 259    Cardiac Enzymes: Recent Labs  Lab 11/12/18 1539  TROPONINI <0.03    BNP: Invalid input(s): POCBNP  CBG: No results for input(s): GLUCAP in the last  168 hours.  Microbiology: Results for orders placed or performed during the hospital encounter of 11/12/18  Body fluid culture     Status: None (Preliminary result)   Collection Time: 11/12/18  9:51 PM  Result Value Ref Range Status   Specimen Description PERITONEAL FLUID  Final   Special Requests IN BAG  Final   Gram Stain   Final    RARE WBC PRESENT, PREDOMINANTLY MONONUCLEAR NO ORGANISMS SEEN Performed at [facility], [place] [address], [place], [place] [address]    Culture PENDING  Incomplete   Report Status PENDING  Incomplete    Coagulation Studies: No results for input(s): LABPROT, INR in the last 72 hours.  Urinalysis: No results for input(s): COLORURINE, LABSPEC, PHURINE, GLUCOSEU, HGBUR, BILIRUBINUR, KETONESUR, PROTEINUR, UROBILINOGEN, NITRITE, LEUKOCYTESUR in the last 72 hours.  Invalid input(s): APPERANCEUR    Imaging: Ct Abdomen Pelvis Wo Contrast  Result Date: 11/12/2018 CLINICAL DATA:  Abdominal pain. Vomiting. Chronic renal failure. EXAM: CT ABDOMEN AND PELVIS WITHOUT CONTRAST TECHNIQUE: Multidetector CT imaging of the abdomen and pelvis was performed following the standard protocol without IV contrast. COMPARISON:  CT scan dated 05/03/2018 FINDINGS: Lower chest: No acute abnormality. Aortic atherosclerosis. Calcification of the aortic valve. Hepatobiliary: No focal liver abnormality is seen. No gallstones, gallbladder wall thickening, or biliary dilatation. Pancreas: Unremarkable. No pancreatic ductal dilatation or surrounding  inflammatory changes. Spleen: Normal in size without focal abnormality. Adrenals/Urinary Tract: Normal adrenal glands. Chronic cystic changes and calcifications in both kidneys. No excretion of contrast from the kidneys. There is fluid in the bladder. Stomach/Bowel: There multiple dilated loops of proximal small bowel with a transition point in the right lower quadrant. Distal small bowel is not distended. Colon is normal. Stomach is slightly  distended but the duodenum is not distended. Appendix is normal. Vascular/Lymphatic: Aortic atherosclerosis. No enlarged abdominal or pelvic lymph nodes. Reproductive: Prostate is unremarkable. Other: Small amount of fluid in the pelvis around the peritoneal dialysis catheter, the tip of which is in the mid pelvis. Musculoskeletal: No acute abnormality. Slight sclerosis of the bones consistent with the patient's history of chronic renal failure. IMPRESSION: Partial or early complete small bowel obstruction with a transition point in the right lower quadrant, similar to the appearance on the prior study of 05/03/2018. Aortic Atherosclerosis (ICD10-I70.0). Electronically Signed   By: Lorriane Shire M.D.   On: 11/12/2018 16:22     Medications:   . sodium chloride 30 mL/hr at 11/13/18 0403   . allopurinol  100 mg Oral QPM  . benazepril  20 mg Oral Daily  . calcium acetate  1,334 mg Oral TID WC  . calcium acetate  667 mg Oral With snacks  . cinacalcet  90 mg Oral BID WC  . gentamicin cream  1 application Topical Daily  . heparin  5,000 Units Subcutaneous Q8H  . [START ON 11/14/2018] ketoconazole  1 application Topical Once per day on Mon Wed Fri  . metoprolol succinate  50 mg Oral Daily  . multivitamin  1 tablet Oral Daily  . torsemide  100 mg Oral Daily  . valACYclovir  500 mg Oral Q48H     Assessment/ Plan:  [name] is a 57 y.o. [place] male with end stage renal disease on peritoneal dialysis, gout, history of small bowel obstruction, herpes zoster infection   Presents with partial small bowel obstruction. On 11/12/2018   [facility] PD CCPD 9 hours 4 exchanges 2.8 liters. Last fill 2 liters with extraneal solution.   1.  ESRD on peritoneal dialysis.  Tolerated treatment well last night.  Peritoneal dialysis for tonight. Orders prepared.  - hold last fill due to small bowel obstruction  2.  Hypertension: blood pressure at goal. Home regimen of benazepril, metoprolol and  torsemide.   3.  Anemia of chronic kidney disease.   - epo as outpatient.   4.  Secondary hyperparathyroidism.   - restart phos binders when restarting PO diet.    LOS: 1 Matthew Mathis 12/10/201911:11 AM

## 2018-11-13 NOTE — Progress Notes (Signed)
[name] Progress Note     Subjective: No acute events overnight. He reports improvement in his abdominal pain and nausea since placement of the NGT and has output about 400 ccs. No complaints of fever or chills. He does report one watery bowel movement since admission. No reports of flatus. Is on peritoneal dialysis.   Objective: Vital signs in last 24 hours: Temp:  [97.5 F (36.4 C)-97.7 F (36.5 C)] 97.7 F (36.5 C) (12/10 0520) Pulse Rate:  [98-111] 98 (12/10 0520) Resp:  [15-21] 17 (12/10 0520) BP: (94-133)/(70-87) 94/71 (12/10 0520) SpO2:  [97 %-100 %] 97 % (12/10 0520) Weight:  [71.9 kg-77.1 kg] 71.9 kg (12/10 0500) Last BM Date: 11/12/18  Intake/Output from previous day: 12/09 0701 - 12/10 0700 In: 12073.1 [I.V.:123.1; IV Piggyback:750] Out: 400 [Emesis/NG output:400] Intake/Output this shift: No intake/output data recorded.  PE: Gen:  Alert, NAD, pleasant HENT: NGT in place Pulm:  Normal effort, Abd: Soft, tenderness worse in periumbilical region, non-distended Skin: warm and dry, no rashes  Psych: A&Ox3    Lab Results:  Recent Labs    11/12/18 1539 11/13/18 0426  WBC 18.0* 14.0*  HGB 13.6 11.7*  HCT 41.1 35.7*  PLT 309 259   BMET Recent Labs    11/12/18 1539 11/13/18 0426  NA 129* 135  K 6.0* 5.4*  CL 90* 94*  CO2 21* 28  GLUCOSE 171* 158*  BUN 59* 61*  CREATININE 10.39* 9.57*  CALCIUM 9.4 8.4*   PT/INR No results for input(s): LABPROT, INR in the last 72 hours. CMP     Component Value Date/Time   NA 135 11/13/2018 0426   NA 136 01/02/2013 1611   K 5.4 (H) 11/13/2018 0426   K 3.4 (L) 01/02/2013 1611   CL 94 (L) 11/13/2018 0426   CL 99 01/02/2013 1611   CO2 28 11/13/2018 0426   CO2 25 01/02/2013 1611   GLUCOSE 158 (H) 11/13/2018 0426   GLUCOSE 99 01/02/2013 1611   BUN 61 (H) 11/13/2018 0426   BUN 71 (H) 01/02/2013 1611   CREATININE 9.57 (H) 11/13/2018 0426   CREATININE 10.21 (H) 01/02/2013 1611   CALCIUM 8.4  (L) 11/13/2018 0426   CALCIUM 8.5 01/02/2013 1611   PROT 7.4 11/12/2018 1539   PROT 7.2 01/02/2013 1611   ALBUMIN 4.8 11/12/2018 1539   ALBUMIN 3.7 01/02/2013 1611   AST 23 11/12/2018 1539   AST 13 (L) 01/02/2013 1611   ALT 18 11/12/2018 1539   ALT 32 01/02/2013 1611   ALKPHOS 115 11/12/2018 1539   ALKPHOS 81 01/02/2013 1611   BILITOT 1.0 11/12/2018 1539   BILITOT 0.6 01/02/2013 1611   GFRNONAA 5 (L) 11/13/2018 0426   GFRNONAA 5 (L) 01/02/2013 1611   GFRAA 6 (L) 11/13/2018 0426   GFRAA 6 (L) 01/02/2013 1611   Lipase     Component Value Date/Time   LIPASE 62 (H) 11/12/2018 1539   LIPASE 184 01/02/2013 1611       Studies/Results: Ct Abdomen Pelvis Wo Contrast  Result Date: 11/12/2018 CLINICAL DATA:  Abdominal pain. Vomiting. Chronic renal failure. EXAM: CT ABDOMEN AND PELVIS WITHOUT CONTRAST TECHNIQUE: Multidetector CT imaging of the abdomen and pelvis was performed following the standard protocol without IV contrast. COMPARISON:  CT scan dated 05/03/2018 FINDINGS: Lower chest: No acute abnormality. Aortic atherosclerosis. Calcification of the aortic valve. Hepatobiliary: No focal liver abnormality is seen. No gallstones, gallbladder wall thickening, or biliary dilatation. Pancreas: Unremarkable. No pancreatic ductal dilatation or surrounding inflammatory changes.  Spleen: Normal in size without focal abnormality. Adrenals/Urinary Tract: Normal adrenal glands. Chronic cystic changes and calcifications in both kidneys. No excretion of contrast from the kidneys. There is fluid in the bladder. Stomach/Bowel: There multiple dilated loops of proximal small bowel with a transition point in the right lower quadrant. Distal small bowel is not distended. Colon is normal. Stomach is slightly distended but the duodenum is not distended. Appendix is normal. Vascular/Lymphatic: Aortic atherosclerosis. No enlarged abdominal or pelvic lymph nodes. Reproductive: Prostate is unremarkable. Other: Small  amount of fluid in the pelvis around the peritoneal dialysis catheter, the tip of which is in the mid pelvis. Musculoskeletal: No acute abnormality. Slight sclerosis of the bones consistent with the patient's history of chronic renal failure. IMPRESSION: Partial or early complete small bowel obstruction with a transition point in the right lower quadrant, similar to the appearance on the prior study of 05/03/2018. Aortic Atherosclerosis (ICD10-I70.0). Electronically Signed   By: Lorriane Shire M.D.   On: 11/12/2018 16:22    Anti-infectives: Anti-infectives (From admission, onward)   Start     Dose/Rate Route Frequency Ordered Stop   11/12/18 2215  valACYclovir (VALTREX) tablet 500 mg     500 mg Oral Every 48 hours 11/12/18 2201     11/12/18 2100  cefTRIAXone (ROCEPHIN) 2 g in sodium chloride 0.9 % 100 mL IVPB  Status:  Discontinued     2 g 200 mL/hr over 30 Minutes Intravenous Every 24 hours 11/12/18 1942 11/13/18 0933   11/12/18 2100  metroNIDAZOLE (FLAGYL) IVPB 500 mg  Status:  Discontinued     500 mg 100 mL/hr over 60 Minutes Intravenous Every 8 hours 11/12/18 1942 11/13/18 0933       Assessment/Plan   Small Bowel Obstruction Matthew Mathis is a 57 y.o. male with clinically improving small bowel obstruction which is complicated by pertinent comorbidities HTN and chronic renal failure on peritoneal dialysis.     - NGT to low intermittent wall suction, monitor output   - NPO + IVF   - Pain control as needed (minimize narcotics), anti-emetics   - Monitor abdominal exam and on-going bowel function   - No indication for surgical intervention currently. Discussed that if he were to clinically deteriorate there may be a need for surgical intervention, which voiced understanding of.    - Mobilize   - Medical management of comorbidities.   -- Edison Simon , PA-C [facility] 11/13/2018, 9:57 AM [phone] M-F: 7am - 4pm

## 2018-11-13 NOTE — Progress Notes (Signed)
PD start, pre PD weight 72.7kg, exit site care performed.    11/13/18 1841  Cycler Setup  Total Number of Exchanges 4  Fill Volume 2800  Dianeal Solution Dextrose 1.5% in 6000 mL  Fill Time - Minute(s) 10  Drain Time - Minute(s) 20 mins  Completion  Exit Site Care Performed Yes  Treatment Status Started  Fluid Balance - CCPD  Total Intake for Exchanges (mL) 11200 ml  Education / Care Plan  Dialysis Education Provided Yes  Documented Education in Care Plan Yes  Hand-Off documentation  Report given to (Full Name) Levester Fresh  Report received from (Full Name) Melvenia Needles

## 2018-11-14 ENCOUNTER — Inpatient Hospital Stay: Payer: Medicare Other

## 2018-11-14 MED ORDER — TRAMADOL HCL 50 MG PO TABS
50.0000 mg | ORAL_TABLET | Freq: Three times a day (TID) | ORAL | Status: DC | PRN
  Administered 2018-11-14: 50 mg via ORAL
  Filled 2018-11-14: qty 1

## 2018-11-14 MED ORDER — LIDOCAINE 5 % EX PTCH
1.0000 | MEDICATED_PATCH | CUTANEOUS | Status: DC
  Administered 2018-11-14 – 2018-11-15 (×2): 1 via TRANSDERMAL
  Filled 2018-11-14 (×3): qty 1

## 2018-11-14 NOTE — Progress Notes (Signed)
[facility] Progress Note     Subjective: No acute events overnight. He reports that his abdominal pain continues to improve and only endorses mild soreness periumbically. No complaints of nausea or emesis. He does endorse another bowel movement yesterday and 3 episodes of flatus. On peritoneal dialysis NGT with 1500 ccs out in last 24 hours per chart review   Objective: Vital signs in last 24 hours: Temp:  [97.5 F (36.4 C)-98.7 F (37.1 C)] 97.5 F (36.4 C) (12/11 0646) Pulse Rate:  [88-106] 94 (12/11 0646) Resp:  [16-18] 17 (12/11 0646) BP: (105-117)/(72-76) 117/75 (12/11 0646) SpO2:  [95 %-98 %] 95 % (12/11 0646) Weight:  [67.7 kg] 67.7 kg (12/11 0500) Last BM Date: 11/13/18  Intake/Output from previous day: 12/10 0701 - 12/11 0700 In: 11819.5 [I.V.:619.5] Out: 1500 [Emesis/NG output:1500] Intake/Output this shift: No intake/output data recorded.  PE: Gen:  Alert, NAD, pleasant HENT: NGT in place Pulm:  Normal effort Abd: Soft, improved tenderness in periumbilical region, non-distended. Peritoneal dialysis catheter in RLQ, multiple well healed abdominal surgical incisions.  Skin: warm and dry, no rashes  Psych: A&Ox3    Lab Results:  Recent Labs    11/12/18 1539 11/13/18 0426  WBC 18.0* 14.0*  HGB 13.6 11.7*  HCT 41.1 35.7*  PLT 309 259   BMET Recent Labs    11/12/18 1539 11/13/18 0426  NA 129* 135  K 6.0* 5.4*  CL 90* 94*  CO2 21* 28  GLUCOSE 171* 158*  BUN 59* 61*  CREATININE 10.39* 9.57*  CALCIUM 9.4 8.4*   PT/INR No results for input(s): LABPROT, INR in the last 72 hours. CMP     Component Value Date/Time   NA 135 11/13/2018 0426   NA 136 01/02/2013 1611   K 5.4 (H) 11/13/2018 0426   K 3.4 (L) 01/02/2013 1611   CL 94 (L) 11/13/2018 0426   CL 99 01/02/2013 1611   CO2 28 11/13/2018 0426   CO2 25 01/02/2013 1611   GLUCOSE 158 (H) 11/13/2018 0426   GLUCOSE 99 01/02/2013 1611   BUN 61 (H) 11/13/2018 0426   BUN 71 (H)  01/02/2013 1611   CREATININE 9.57 (H) 11/13/2018 0426   CREATININE 10.21 (H) 01/02/2013 1611   CALCIUM 8.4 (L) 11/13/2018 0426   CALCIUM 8.5 01/02/2013 1611   PROT 7.4 11/12/2018 1539   PROT 7.2 01/02/2013 1611   ALBUMIN 4.8 11/12/2018 1539   ALBUMIN 3.7 01/02/2013 1611   AST 23 11/12/2018 1539   AST 13 (L) 01/02/2013 1611   ALT 18 11/12/2018 1539   ALT 32 01/02/2013 1611   ALKPHOS 115 11/12/2018 1539   ALKPHOS 81 01/02/2013 1611   BILITOT 1.0 11/12/2018 1539   BILITOT 0.6 01/02/2013 1611   GFRNONAA 5 (L) 11/13/2018 0426   GFRNONAA 5 (L) 01/02/2013 1611   GFRAA 6 (L) 11/13/2018 0426   GFRAA 6 (L) 01/02/2013 1611   Lipase     Component Value Date/Time   LIPASE 62 (H) 11/12/2018 1539   LIPASE 184 01/02/2013 1611       Studies/Results: Ct Abdomen Pelvis Wo Contrast  Result Date: 11/12/2018 CLINICAL DATA:  Abdominal pain. Vomiting. Chronic renal failure. EXAM: CT ABDOMEN AND PELVIS WITHOUT CONTRAST TECHNIQUE: Multidetector CT imaging of the abdomen and pelvis was performed following the standard protocol without IV contrast. COMPARISON:  CT scan dated 05/03/2018 FINDINGS: Lower chest: No acute abnormality. Aortic atherosclerosis. Calcification of the aortic valve. Hepatobiliary: No focal liver abnormality is seen. No gallstones, gallbladder wall  thickening, or biliary dilatation. Pancreas: Unremarkable. No pancreatic ductal dilatation or surrounding inflammatory changes. Spleen: Normal in size without focal abnormality. Adrenals/Urinary Tract: Normal adrenal glands. Chronic cystic changes and calcifications in both kidneys. No excretion of contrast from the kidneys. There is fluid in the bladder. Stomach/Bowel: There multiple dilated loops of proximal small bowel with a transition point in the right lower quadrant. Distal small bowel is not distended. Colon is normal. Stomach is slightly distended but the duodenum is not distended. Appendix is normal. Vascular/Lymphatic: Aortic  atherosclerosis. No enlarged abdominal or pelvic lymph nodes. Reproductive: Prostate is unremarkable. Other: Small amount of fluid in the pelvis around the peritoneal dialysis catheter, the tip of which is in the mid pelvis. Musculoskeletal: No acute abnormality. Slight sclerosis of the bones consistent with the patient's history of chronic renal failure. IMPRESSION: Partial or early complete small bowel obstruction with a transition point in the right lower quadrant, similar to the appearance on the prior study of 05/03/2018. Aortic Atherosclerosis (ICD10-I70.0). Electronically Signed   By: Lorriane Shire M.D.   On: 11/12/2018 16:22    Anti-infectives: Anti-infectives (From admission, onward)   Start     Dose/Rate Route Frequency Ordered Stop   11/12/18 2215  valACYclovir (VALTREX) tablet 500 mg     500 mg Oral Every 48 hours 11/12/18 2201     11/12/18 2100  cefTRIAXone (ROCEPHIN) 2 g in sodium chloride 0.9 % 100 mL IVPB  Status:  Discontinued     2 g 200 mL/hr over 30 Minutes Intravenous Every 24 hours 11/12/18 1942 11/13/18 0933   11/12/18 2100  metroNIDAZOLE (FLAGYL) IVPB 500 mg  Status:  Discontinued     500 mg 100 mL/hr over 60 Minutes Intravenous Every 8 hours 11/12/18 1942 11/13/18 0933       Assessment/Plan  Small Bowel Obstruction Burns Matthew Mathis is a 57 y.o. male with clinically improving small bowel obstruction most likely attributable to post-surgical adhesions which is complicated by pertinent comorbidities HTN and chronic renal failure on peritoneal dialysis.                           - Will get KUB this morning.     - Clamping trial of NGT for 4 hours, if residual output <150 ccs will remove NGT                         - NPO + IVF, trial on clears if NGT out later today                         - Pain control as needed (minimize narcotics), anti-emetics                         - Monitor abdominal exam and on-going bowel function                         - No indication for  surgical intervention currently. Discussed that if he were to clinically deteriorate there may be a need for surgical intervention, which voiced understanding of.                          - Mobilization encouraged   - Continue PD, no contraindications currently, nephrology following appreciate their help                         -  Medical management of comorbidities, appreciate Hospitalist help.    -- Edison Simon , PA-C [facility] 11/14/2018, 8:07 AM [phone] M-F: 7am - 4pm

## 2018-11-14 NOTE — Progress Notes (Signed)
Post PD assessment. Pt tolerated tx well without c/o or complication. Net UF -497, post weight 72.8kg, avg dwell 1 hour 40 minutes, I-drain volume 51ml.    11/14/18 0832  Completion  Weight after Drain 160 lb 7.9 oz (72.8 kg)  Effluent Appearance Clear;Yellow  Treatment Status Complete  Fluid Balance - CCPD  Total Output for Exchanges (mL) 10703 ml  Procedure Comments  Tolerated treatment well? Yes  Education / Care Plan  Dialysis Education Provided Yes  Documented Education in Care Plan Yes  Hand-Off documentation  Report given to (Full Name) Delora Fuel  Report received from (Full Name) Stark Bray

## 2018-11-14 NOTE — Progress Notes (Signed)
[facility]  ROUNDING NOTE   Subjective:   Wife at bedside. Tolerated PD last night.   Objective:  Vital signs in last 24 hours:  Temp:  [97.5 F (36.4 C)-98.7 F (37.1 C)] 97.6 F (36.4 C) (12/11 1150) Pulse Rate:  [94-106] 94 (12/11 1150) Resp:  [16-17] 17 (12/11 1150) BP: (109-117)/(71-80) 109/71 (12/11 1150) SpO2:  [95 %-98 %] 96 % (12/11 1150) Weight:  [67.7 kg] 67.7 kg (12/11 0500)  Weight change: -9.412 kg Filed Weights   11/12/18 1426 11/13/18 0500 11/14/18 0500  Weight: 77.1 kg 71.9 kg 67.7 kg    Intake/Output: I/O last 3 completed shifts: In: 23342.6 [I.V.:742.6; Other:22400; IV Piggyback:200] Out: 1900 [Emesis/NG output:1900]   Intake/Output this shift:  Total I/O In: 202.4 [I.V.:202.4] Out: 37169 [Emesis/NG output:550; CVELF:81017]  Physical Exam: General: NAD,   Head: NGT tube to suction  Eyes: Anicteric, PERRL  Neck: Supple, trachea midline  Lungs:  Clear to auscultation  Heart: Regular rate and rhythm  Abdomen:  Soft, nontender,   Extremities: no peripheral edema.  Neurologic: Nonfocal, moving all four extremities  Skin: No lesions  Access: PD catheter    Basic Metabolic Panel: Recent Labs  Lab 11/12/18 1539 11/13/18 0426  NA 129* 135  K 6.0* 5.4*  CL 90* 94*  CO2 21* 28  GLUCOSE 171* 158*  BUN 59* 61*  CREATININE 10.39* 9.57*  CALCIUM 9.4 8.4*    Liver Function Tests: Recent Labs  Lab 11/12/18 1539  AST 23  ALT 18  ALKPHOS 115  BILITOT 1.0  PROT 7.4  ALBUMIN 4.8   Recent Labs  Lab 11/12/18 1539  LIPASE 62*   No results for input(s): AMMONIA in the last 168 hours.  CBC: Recent Labs  Lab 11/12/18 1539 11/13/18 0426  WBC 18.0* 14.0*  NEUTROABS 16.6*  --   HGB 13.6 11.7*  HCT 41.1 35.7*  MCV 99.5 99.4  PLT 309 259    Cardiac Enzymes: Recent Labs  Lab 11/12/18 1539  TROPONINI <0.03    BNP: Invalid input(s): POCBNP  CBG: No results for input(s): GLUCAP in the last 168  hours.  Microbiology: Results for orders placed or performed during the hospital encounter of 11/12/18  Body fluid culture     Status: None (Preliminary result)   Collection Time: 11/12/18  9:51 PM  Result Value Ref Range Status   Specimen Description PERITONEAL FLUID  Final   Special Requests IN BAG  Final   Gram Stain   Final    RARE WBC PRESENT, PREDOMINANTLY MONONUCLEAR NO ORGANISMS SEEN    Culture   Final    NO GROWTH 1 DAY Performed at [facility], [place] [address] [place], [place] [address]    Report Status PENDING  Incomplete    Coagulation Studies: No results for input(s): LABPROT, INR in the last 72 hours.  Urinalysis: No results for input(s): COLORURINE, LABSPEC, PHURINE, GLUCOSEU, HGBUR, BILIRUBINUR, KETONESUR, PROTEINUR, UROBILINOGEN, NITRITE, LEUKOCYTESUR in the last 72 hours.  Invalid input(s): APPERANCEUR    Imaging: Dg Abd 2 Views  Result Date: 11/14/2018 CLINICAL DATA:  Small bowel obstruction EXAM: ABDOMEN - 2 VIEW COMPARISON:  11/12/2018 CT FINDINGS: NG tube is in the stomach. Peritoneal dialysis catheter is in the pelvis. Gas and stool noted throughout nondistended colon. No current visualized small bowel distention to suggest small bowel obstruction. No organomegaly or free air. IMPRESSION: Improved bowel gas pattern with no current evidence for small bowel obstruction. NG tube is in the stomach. Electronically  Signed   By: Rolm Baptise M.D.   On: 11/14/2018 09:53     Medications:    . allopurinol  100 mg Oral QPM  . benazepril  20 mg Oral Daily  . calcium acetate  1,334 mg Oral TID WC  . calcium acetate  667 mg Oral With snacks  . cinacalcet  90 mg Oral BID WC  . gentamicin cream  1 application Topical Daily  . heparin  5,000 Units Subcutaneous Q8H  . ketoconazole  1 application Topical Once per day on Mon Wed Fri  . lidocaine  1 patch Transdermal Q24H  . metoprolol succinate  50 mg Oral Daily  . multivitamin  1 tablet Oral Daily  .  torsemide  100 mg Oral Daily  . valACYclovir  500 mg Oral Q48H     Assessment/ Plan:  [name] is a 57 y.o. [place] male with end stage renal disease on peritoneal dialysis, gout, history of small bowel obstruction, herpes zoster infection   Presents with partial small bowel obstruction. On 11/12/2018   [facility] PD CCPD 9 hours 4 exchanges 2.8 liters. Last fill 2 liters with extraneal solution.   1.  ESRD on peritoneal dialysis.  Tolerated treatment well last night.  Peritoneal dialysis for tonight. Orders prepared.  - hold last fill due to small bowel obstruction  2.  Hypertension: blood pressure at goal. Home regimen of benazepril, metoprolol and torsemide.   3.  Anemia of chronic kidney disease.   - epo as outpatient.   4.  Secondary hyperparathyroidism.   - restart phos binders when restarting PO diet.    LOS: 2 Matthew Mathis 12/11/20195:57 PM

## 2018-11-14 NOTE — Progress Notes (Signed)
PD tx start. Pt pre-PD weight 72.6kg, exit site care performed.    11/14/18 1929  Cycler Setup  Total Number of Exchanges 4  Fill Volume 2800  Dianeal Solution Dextrose 1.5% in 6000 mL  Fill Time - Minute(s) 10  Drain Time - Minute(s) 20 mins  Completion  Exit Site Care Performed Yes  Treatment Status Started  Fluid Balance - CCPD  Total Intake for Exchanges (mL) 11200 ml  Education / Care Plan  Dialysis Education Provided Yes  Documented Education in Care Plan Yes  Hand-Off documentation  Report given to (Full Name) Delora Fuel  Report received from (Full Name) Stark Bray

## 2018-11-14 NOTE — Progress Notes (Signed)
Order received from Dr Hampton Abbot to order clear liquid diet.  Patient requesting something for shingles pain.  The tylenol did not help.  Dr Posey Pronto ordered ultram and lidocain patch

## 2018-11-14 NOTE — Progress Notes (Signed)
[facility] [place] NAME: Matthew Mathis    MR#:  [id number]  DATE OF BIRTH:  11-07-1961  SUBJECTIVE:  patient came in with generalized abdominal pain. He had 2 BM's and passing . He feels better after NG tube was placed. Wife in the room. No fever. No vomiting. NG ~500 cc since 3 am REVIEW OF SYSTEMS:   Review of Systems  Constitutional: Negative for chills, fever and weight loss.  HENT: Negative for ear discharge, ear pain and nosebleeds.   Eyes: Negative for blurred vision, pain and discharge.  Respiratory: Negative for sputum production, shortness of breath, wheezing and stridor.   Cardiovascular: Negative for chest pain, palpitations, orthopnea and PND.  Gastrointestinal: Positive for abdominal pain. Negative for diarrhea, nausea and vomiting.  Genitourinary: Negative for frequency and urgency.  Musculoskeletal: Negative for back pain and joint pain.  Neurological: Negative for sensory change, speech change, focal weakness and weakness.  Psychiatric/Behavioral: Negative for depression and hallucinations. The patient is not nervous/anxious.    Tolerating Diet:npo  Tolerating PT: not needed  DRUG ALLERGIES:  No Known Allergies  VITALS:  Blood pressure 117/80, pulse 98, temperature (!) 97.5 F (36.4 C), temperature source Oral, resp. rate 16, height 5\' 6"  (1.676 m), weight 67.7 kg, SpO2 95 %.  PHYSICAL EXAMINATION:   Physical Exam  GENERAL:  57 y.o.-year-old patient lying in the bed with no acute distress.  EYES: Pupils equal, round, reactive to light and accommodation. No scleral icterus. Extraocular muscles intact.  HEENT: Head atraumatic, normocephalic. Oropharynx and nasopharynx clear. NG+ NECK:  Supple, no jugular venous distention. No thyroid enlargement, no tenderness.  LUNGS: Normal breath sounds bilaterally, no wheezing, rales, rhonchi. No use of accessory muscles of respiration.  CARDIOVASCULAR: S1, S2 normal. No  murmurs, rubs, or gallops.  ABDOMEN: Soft, nontender, nondistended. few Bowel sounds present. No organomegaly or mass.  EXTREMITIES: No cyanosis, clubbing or edema b/l.    NEUROLOGIC: Cranial nerves II through XII are intact. No focal Motor or sensory deficits b/l.   PSYCHIATRIC:  patient is alert and oriented x 3.  SKIN: No obvious rash, lesion, or ulcer.   LABORATORY PANEL:  CBC Recent Labs  Lab 11/13/18 0426  WBC 14.0*  HGB 11.7*  HCT 35.7*  PLT 259    Chemistries  Recent Labs  Lab 11/12/18 1539 11/13/18 0426  NA 129* 135  K 6.0* 5.4*  CL 90* 94*  CO2 21* 28  GLUCOSE 171* 158*  BUN 59* 61*  CREATININE 10.39* 9.57*  CALCIUM 9.4 8.4*  AST 23  --   ALT 18  --   ALKPHOS 115  --   BILITOT 1.0  --    Cardiac Enzymes Recent Labs  Lab 11/12/18 1539  TROPONINI <0.03   RADIOLOGY:  Ct Abdomen Pelvis Wo Contrast  Result Date: 11/12/2018 CLINICAL DATA:  Abdominal pain. Vomiting. Chronic renal failure. EXAM: CT ABDOMEN AND PELVIS WITHOUT CONTRAST TECHNIQUE: Multidetector CT imaging of the abdomen and pelvis was performed following the standard protocol without IV contrast. COMPARISON:  CT scan dated 05/03/2018 FINDINGS: Lower chest: No acute abnormality. Aortic atherosclerosis. Calcification of the aortic valve. Hepatobiliary: No focal liver abnormality is seen. No gallstones, gallbladder wall thickening, or biliary dilatation. Pancreas: Unremarkable. No pancreatic ductal dilatation or surrounding inflammatory changes. Spleen: Normal in size without focal abnormality. Adrenals/Urinary Tract: Normal adrenal glands. Chronic cystic changes and calcifications in both kidneys. No excretion of contrast from the kidneys. There is fluid in the  bladder. Stomach/Bowel: There multiple dilated loops of proximal small bowel with a transition point in the right lower quadrant. Distal small bowel is not distended. Colon is normal. Stomach is slightly distended but the duodenum is not distended.  Appendix is normal. Vascular/Lymphatic: Aortic atherosclerosis. No enlarged abdominal or pelvic lymph nodes. Reproductive: Prostate is unremarkable. Other: Small amount of fluid in the pelvis around the peritoneal dialysis catheter, the tip of which is in the mid pelvis. Musculoskeletal: No acute abnormality. Slight sclerosis of the bones consistent with the patient's history of chronic renal failure. IMPRESSION: Partial or early complete small bowel obstruction with a transition point in the right lower quadrant, similar to the appearance on the prior study of 05/03/2018. Aortic Atherosclerosis (ICD10-I70.0). Electronically Signed   By: Lorriane Shire M.D.   On: 11/12/2018 16:22   Dg Abd 2 Views  Result Date: 11/14/2018 CLINICAL DATA:  Small bowel obstruction EXAM: ABDOMEN - 2 VIEW COMPARISON:  11/12/2018 CT FINDINGS: NG tube is in the stomach. Peritoneal dialysis catheter is in the pelvis. Gas and stool noted throughout nondistended colon. No current visualized small bowel distention to suggest small bowel obstruction. No organomegaly or free air. IMPRESSION: Improved bowel gas pattern with no current evidence for small bowel obstruction. NG tube is in the stomach. Electronically Signed   By: Rolm Baptise M.D.   On: 11/14/2018 09:53   ASSESSMENT AND PLAN:  Matthew Mathis  is a 56 y.o. male with a known history per below which includes small bowel obstruction, presenting with acute abdominal pain that started at 3 AM this morning with associated nausea, emesis  *recurrent small bowel obstruction--last episode in May 2019--treated conservatively -seen by surgery Dr Hampton Abbot - Continue conservative treatment. Patient feel better. - NG tube for decompression -peritoneal fluid no infection. Discontinue antibiotics. This was discussed with Dr. Juleen China.  *End-stage renal disease on peritoneal dialysis Nephrology consulted for peritoneal dialysis needs  *Acute hyperkalemia Treated in the emergency room  with calcium gluconate, IV insulin, sodium bicarb, BMP in the morning -potassium better.  *Acute hyponatremia/hypochloremia Secondary to above received IV fluids for rehydration  *Chronic benign essential hypertension Stable Continue home regimen  *Right upper chest Shingles Cont valacylovir 500 mg Q48 hours. Last dose 11/19/18   Case discussed with Care Management/Social Worker. Management plans discussed with the patient, family and they are in agreement.  CODE STATUS: full  DVT Prophylaxis: heparin  TOTAL TIME TAKING CARE OF THIS PATIENT: *30* minutes.  >50% time spent on counselling and coordination of care  POSSIBLE D/C IN *few* DAYS, DEPENDING ON CLINICAL CONDITION.  Note: This dictation was prepared with Dragon dictation along with smaller phrase technology. Any transcriptional errors that result from this process are unintentional.  Fritzi Mandes M.D on 11/14/2018 at 11:01 AM  Between 7am to 6pm - Pager - [phone]  After 6pm go to [email or web address] - password [email or web address] [facility]  Office  [phone]  CC: Primary care physician; Alexis Goodell, MDPatient ID: Matthew Mathis, male   DOB: [birth year], 57 y.o.   MRN: [id number]

## 2018-11-15 DIAGNOSIS — Z7682 Awaiting organ transplant status: Principal | ICD-10-CM

## 2018-11-15 DIAGNOSIS — N186 End stage renal disease: Secondary | ICD-10-CM

## 2018-11-15 DIAGNOSIS — Z01818 Encounter for other preprocedural examination: Secondary | ICD-10-CM

## 2018-11-15 MED ORDER — IBUPROFEN 400 MG PO TABS
400.0000 mg | ORAL_TABLET | Freq: Once | ORAL | Status: AC
  Administered 2018-11-15: 400 mg via ORAL
  Filled 2018-11-15: qty 1

## 2018-11-15 NOTE — Care Management Important Message (Signed)
Copy of signed IM left with patient in room.  

## 2018-11-15 NOTE — Progress Notes (Signed)
[facility] Progress Note     Subjective: NGT out last night and started on clears which he has tolerated well. No complaints of abdominal pain, nausea, or emesis. Does endorse flatus but no BM in last 24 hours. Tolerating PD without problems.   Objective: Vital signs in last 24 hours: Temp:  [97.6 F (36.4 C)-98.2 F (36.8 C)] 98.2 F (36.8 C) (12/12 0629) Pulse Rate:  [85-98] 85 (12/12 0629) Resp:  [16-18] 17 (12/12 0629) BP: (109-133)/(71-87) 133/87 (12/12 0629) SpO2:  [96 %-100 %] 100 % (12/12 0629) Weight:  [72.6 kg] 72.6 kg (12/12 0500) Last BM Date: 11/14/18  Intake/Output from previous day: 12/11 0701 - 12/12 0700 In: 11402.4 [I.V.:202.4] Out: 11403 [Urine:150; Emesis/NG output:550] Intake/Output this shift: No intake/output data recorded.  PE: Gen: Alert, NAD, pleasant Pulm: Normal effort Abd: Soft,non-tender, non-distended. Peritoneal dialysis catheter in RLQ, multiple well healed abdominal surgical incisions.  Skin: warm and dry, no rashes  Psych: A&Ox3    Lab Results:  Recent Labs    11/12/18 1539 11/13/18 0426  WBC 18.0* 14.0*  HGB 13.6 11.7*  HCT 41.1 35.7*  PLT 309 259   BMET Recent Labs    11/12/18 1539 11/13/18 0426  NA 129* 135  K 6.0* 5.4*  CL 90* 94*  CO2 21* 28  GLUCOSE 171* 158*  BUN 59* 61*  CREATININE 10.39* 9.57*  CALCIUM 9.4 8.4*   PT/INR No results for input(s): LABPROT, INR in the last 72 hours. CMP     Component Value Date/Time   NA 135 11/13/2018 0426   NA 136 01/02/2013 1611   K 5.4 (H) 11/13/2018 0426   K 3.4 (L) 01/02/2013 1611   CL 94 (L) 11/13/2018 0426   CL 99 01/02/2013 1611   CO2 28 11/13/2018 0426   CO2 25 01/02/2013 1611   GLUCOSE 158 (H) 11/13/2018 0426   GLUCOSE 99 01/02/2013 1611   BUN 61 (H) 11/13/2018 0426   BUN 71 (H) 01/02/2013 1611   CREATININE 9.57 (H) 11/13/2018 0426   CREATININE 10.21 (H) 01/02/2013 1611   CALCIUM 8.4 (L) 11/13/2018 0426   CALCIUM 8.5 01/02/2013 1611    PROT 7.4 11/12/2018 1539   PROT 7.2 01/02/2013 1611   ALBUMIN 4.8 11/12/2018 1539   ALBUMIN 3.7 01/02/2013 1611   AST 23 11/12/2018 1539   AST 13 (L) 01/02/2013 1611   ALT 18 11/12/2018 1539   ALT 32 01/02/2013 1611   ALKPHOS 115 11/12/2018 1539   ALKPHOS 81 01/02/2013 1611   BILITOT 1.0 11/12/2018 1539   BILITOT 0.6 01/02/2013 1611   GFRNONAA 5 (L) 11/13/2018 0426   GFRNONAA 5 (L) 01/02/2013 1611   GFRAA 6 (L) 11/13/2018 0426   GFRAA 6 (L) 01/02/2013 1611   Lipase     Component Value Date/Time   LIPASE 62 (H) 11/12/2018 1539   LIPASE 184 01/02/2013 1611       Studies/Results: Dg Abd 2 Views  Result Date: 11/14/2018 CLINICAL DATA:  Small bowel obstruction EXAM: ABDOMEN - 2 VIEW COMPARISON:  11/12/2018 CT FINDINGS: NG tube is in the stomach. Peritoneal dialysis catheter is in the pelvis. Gas and stool noted throughout nondistended colon. No current visualized small bowel distention to suggest small bowel obstruction. No organomegaly or free air. IMPRESSION: Improved bowel gas pattern with no current evidence for small bowel obstruction. NG tube is in the stomach. Electronically Signed   By: Rolm Baptise M.D.   On: 11/14/2018 09:53    Anti-infectives: Anti-infectives (From admission,  onward)   Start     Dose/Rate Route Frequency Ordered Stop   11/12/18 2215  valACYclovir (VALTREX) tablet 500 mg     500 mg Oral Every 48 hours 11/12/18 2201     11/12/18 2100  cefTRIAXone (ROCEPHIN) 2 g in sodium chloride 0.9 % 100 mL IVPB  Status:  Discontinued     2 g 200 mL/hr over 30 Minutes Intravenous Every 24 hours 11/12/18 1942 11/13/18 0933   11/12/18 2100  metroNIDAZOLE (FLAGYL) IVPB 500 mg  Status:  Discontinued     500 mg 100 mL/hr over 60 Minutes Intravenous Every 8 hours 11/12/18 1942 11/13/18 0933       Assessment/Plan  Small Bowel Obstruction Matthew Chungis a57 y.o.malewith clinically resolving small bowel obstruction most likely attributable to post-surgical  adhesions which is complicated by pertinent comorbidities HTN and chronic renal failure on peritoneal dialysis.   - Advance to full liquids for breakfast, if tolerating, advance to renal diet for lunch - Pain control as needed (minimize narcotics), anti-emetics - Monitor abdominal exam and on-going bowel function - No indication for surgical intervention. - Mobilization encouraged                         - Continue PD, no contraindications currently, nephrology following appreciate their help - Medical management of comorbidities, appreciate Hospitalist help.    - Discharge planning: If tolerating a diet throughout today, should be able to go home tonight or tomorrow morning from surgical standpoint.    Edison Simon , PA-C [facility] 11/15/2018, 9:01 AM [phone] M-F: 7am - 4pm

## 2018-11-15 NOTE — Progress Notes (Signed)
Pre CCPD Assessment    11/15/18 1945  Neurological  Level of Consciousness Alert  Orientation Level Oriented X4  Respiratory  Respiratory Pattern Regular;Unlabored  Chest Assessment Chest expansion symmetrical  Bilateral Breath Sounds Clear  Cough None  Cardiac  Pulse Regular  Heart Sounds S1, S2  ECG Monitor No  Psychosocial  Psychosocial (WDL) WDL

## 2018-11-15 NOTE — Progress Notes (Signed)
CCPD [place] started w/o complication    67/59/16 2015  Cycler Setup  Total Number of Exchanges 4  Fill Volume 2800  Dianeal Solution Dextrose 1.5% in 6000 mL  Fill Time - Minute(s) 10  Drain Time - Minute(s) 20 mins  Completion  Treatment Status Started  Fluid Balance - CCPD  Total Intake for Exchanges (mL) 11200 ml  Hand-Off documentation  Report given to (Full Name) Beatris Ship, RN   Report received from (Full Name) Phoebe Sharps, RN

## 2018-11-15 NOTE — Progress Notes (Signed)
Pre CCPD Assessment    11/15/18 0745  Neurological  Level of Consciousness Alert  Orientation Level Oriented X4  Respiratory  Respiratory Pattern Regular;Unlabored  Bilateral Breath Sounds Clear  Cough None  Cardiac  Pulse Regular  Heart Sounds S1, S2  ECG Monitor No  Psychosocial  Psychosocial (WDL) WDL

## 2018-11-15 NOTE — Progress Notes (Signed)
[facility] [place] NAME: [name] Leete    MR#:  [id number]  DATE OF BIRTH:  1961/11/25  SUBJECTIVE:  patient came in with generalized abdominal pain. He had 2 BM's and passing . He feels better after NG tube was placed. Wife in the room. No fever. No vomiting. NG ~500 cc since 3 am REVIEW OF SYSTEMS:   Review of Systems  Constitutional: Negative for chills, fever and weight loss.  HENT: Negative for ear discharge, ear pain and nosebleeds.   Eyes: Negative for blurred vision, pain and discharge.  Respiratory: Negative for sputum production, shortness of breath, wheezing and stridor.   Cardiovascular: Negative for chest pain, palpitations, orthopnea and PND.  Gastrointestinal: Negative for abdominal pain, diarrhea, nausea and vomiting.  Genitourinary: Negative for frequency and urgency.  Musculoskeletal: Negative for back pain and joint pain.  Neurological: Negative for sensory change, speech change, focal weakness and weakness.  Psychiatric/Behavioral: Negative for depression and hallucinations. The patient is not nervous/anxious.    Tolerating Diet:npo  Tolerating PT: not needed  DRUG ALLERGIES:  No Known Allergies  VITALS:  Blood pressure 110/80, pulse 91, temperature 98.2 F (36.8 C), temperature source Oral, resp. rate 17, height 5\' 6"  (1.676 m), weight 72.6 kg, SpO2 100 %.  PHYSICAL EXAMINATION:   Physical Exam  GENERAL:  57 y.o.-year-old patient lying in the bed with no acute distress.  EYES: Pupils equal, round, reactive to light and accommodation. No scleral icterus. Extraocular muscles intact.  HEENT: Head atraumatic, normocephalic. Oropharynx and nasopharynx clear. NG+ NECK:  Supple, no jugular venous distention. No thyroid enlargement, no tenderness.  LUNGS: Normal breath sounds bilaterally, no wheezing, rales, rhonchi. No use of accessory muscles of respiration.  CARDIOVASCULAR: S1, S2 normal. No murmurs, rubs, or  gallops.  ABDOMEN: Soft, nontender, nondistended. few Bowel sounds present. No organomegaly or mass.  EXTREMITIES: No cyanosis, clubbing or edema b/l.    NEUROLOGIC: Cranial nerves II through XII are intact. No focal Motor or sensory deficits b/l.   PSYCHIATRIC:  patient is alert and oriented x 3.  SKIN: No obvious rash, lesion, or ulcer.   LABORATORY PANEL:  CBC Recent Labs  Lab 11/13/18 0426  WBC 14.0*  HGB 11.7*  HCT 35.7*  PLT 259    Chemistries  Recent Labs  Lab 11/12/18 1539 11/13/18 0426  NA 129* 135  K 6.0* 5.4*  CL 90* 94*  CO2 21* 28  GLUCOSE 171* 158*  BUN 59* 61*  CREATININE 10.39* 9.57*  CALCIUM 9.4 8.4*  AST 23  --   ALT 18  --   ALKPHOS 115  --   BILITOT 1.0  --    Cardiac Enzymes Recent Labs  Lab 11/12/18 1539  TROPONINI <0.03   RADIOLOGY:  Dg Abd 2 Views  Result Date: 11/14/2018 CLINICAL DATA:  Small bowel obstruction EXAM: ABDOMEN - 2 VIEW COMPARISON:  11/12/2018 CT FINDINGS: NG tube is in the stomach. Peritoneal dialysis catheter is in the pelvis. Gas and stool noted throughout nondistended colon. No current visualized small bowel distention to suggest small bowel obstruction. No organomegaly or free air. IMPRESSION: Improved bowel gas pattern with no current evidence for small bowel obstruction. NG tube is in the stomach. Electronically Signed   By: Rolm Baptise M.D.   On: 11/14/2018 09:53   ASSESSMENT AND PLAN:  Cylus Douville  is a 57 y.o. male with a known history per below which includes small bowel obstruction, presenting with acute abdominal  pain that started at 3 AM this morning with associated nausea, emesis  *recurrent small bowel obstruction--last episode in May 2019--treated conservatively -seen by surgery Dr Hampton Abbot - Continue conservative treatment.  Doing much better, advance his diet and see how he does, possible discharge home tomorrow morning. *End-stage renal disease on peritoneal dialysis Nephrology consulted for peritoneal  dialysis needs  *Acute hyperkalemia Treated in the emergency room with calcium gluconate, IV insulin, sodium bicarb, BMP in the morning -potassium better.  *Acute hyponatremia/hypochloremia Secondary to above received IV fluids for rehydration  *Chronic benign essential hypertension Stable Continue home regimen  *Right upper chest Shingles Cont valacylovir 500 mg Q48 hours. Last dose 11/19/18, continue Lidoderm patch, use PRN Tylenol, avoid ibuprofen.  Patient does not want narcotics.   Case discussed with Care Management/Social Worker. Management plans discussed with the patient, family and they are in agreement.  CODE STATUS: full  DVT Prophylaxis: heparin  TOTAL TIME TAKING CARE OF THIS PATIENT: *30* minutes.  >50% time spent on counselling and coordination of care  POSSIBLE D/C IN *few* DAYS, DEPENDING ON CLINICAL CONDITION.  Note: This dictation was prepared with Dragon dictation along with smaller phrase technology. Any transcriptional errors that result from this process are unintentional.  Epifanio Lesches M.D on 11/15/2018 at 11:00 AM  Between 7am to 6pm - Pager - [phone]  After 6pm go to [email or web address] - password [email or web address] [facility]  Office  [phone]  CC: Primary care physician; Alexis Goodell, MDPatient ID: Benard Halsted, male   DOB: [birth year], 56 y.o.   MRN: [id number]

## 2018-11-15 NOTE — Progress Notes (Signed)
Pt verbalized that he is having pain in the area where his shingles were. Pt refuses to take IV morphine and Tramadol. MD notified, verbal order for a one time dose of Ibuprofen 400 mg PO at this time.   Matthew Mathis [employer]

## 2018-11-15 NOTE — Progress Notes (Signed)
[facility]  ROUNDING NOTE   Subjective:   Wife at bedside. Tolerated PD last night.   Clears this morning.   Reports no abdominal pain or discomfort.   Objective:  Vital signs in last 24 hours:  Temp:  [97.6 F (36.4 C)-98.2 F (36.8 C)] 97.6 F (36.4 C) (12/12 1148) Pulse Rate:  [76-91] 76 (12/12 1148) Resp:  [17-18] 17 (12/12 0629) BP: (110-133)/(64-87) 133/64 (12/12 1148) SpO2:  [98 %-100 %] 98 % (12/12 1148) Weight:  [72.6 kg] 72.6 kg (12/12 0500)  Weight change: 4.9 kg Filed Weights   11/13/18 0500 11/14/18 0500 11/15/18 0500  Weight: 71.9 kg 67.7 kg 72.6 kg    Intake/Output: I/O last 3 completed shifts: In: 12021.9 [I.V.:821.9; Other:11200] Out: 29924 [Urine:150; Emesis/NG output:1450; Other:10703]   Intake/Output this shift:  Total I/O In: 120 [P.O.:120] Out: 36 [Other:36]  Physical Exam: General: NAD,   Head: NGT tube to suction  Eyes: Anicteric, PERRL  Neck: Supple, trachea midline  Lungs:  Clear to auscultation  Heart: Regular rate and rhythm  Abdomen:  Soft, nontender,   Extremities: no peripheral edema.  Neurologic: Nonfocal, moving all four extremities  Skin: No lesions  Access: PD catheter    Basic Metabolic Panel: Recent Labs  Lab 11/12/18 1539 11/13/18 0426  NA 129* 135  K 6.0* 5.4*  CL 90* 94*  CO2 21* 28  GLUCOSE 171* 158*  BUN 59* 61*  CREATININE 10.39* 9.57*  CALCIUM 9.4 8.4*    Liver Function Tests: Recent Labs  Lab 11/12/18 1539  AST 23  ALT 18  ALKPHOS 115  BILITOT 1.0  PROT 7.4  ALBUMIN 4.8   Recent Labs  Lab 11/12/18 1539  LIPASE 62*   No results for input(s): AMMONIA in the last 168 hours.  CBC: Recent Labs  Lab 11/12/18 1539 11/13/18 0426  WBC 18.0* 14.0*  NEUTROABS 16.6*  --   HGB 13.6 11.7*  HCT 41.1 35.7*  MCV 99.5 99.4  PLT 309 259    Cardiac Enzymes: Recent Labs  Lab 11/12/18 1539  TROPONINI <0.03    BNP: Invalid input(s): POCBNP  CBG: No results for input(s):  GLUCAP in the last 168 hours.  Microbiology: Results for orders placed or performed during the hospital encounter of 11/12/18  Body fluid culture     Status: None (Preliminary result)   Collection Time: 11/12/18  9:51 PM  Result Value Ref Range Status   Specimen Description PERITONEAL FLUID  Final   Special Requests IN BAG  Final   Gram Stain   Final    RARE WBC PRESENT, PREDOMINANTLY MONONUCLEAR NO ORGANISMS SEEN    Culture   Final    NO GROWTH 2 DAYS Performed at [facility], [place] [address] [place], [place] [address]    Report Status PENDING  Incomplete    Coagulation Studies: No results for input(s): LABPROT, INR in the last 72 hours.  Urinalysis: No results for input(s): COLORURINE, LABSPEC, PHURINE, GLUCOSEU, HGBUR, BILIRUBINUR, KETONESUR, PROTEINUR, UROBILINOGEN, NITRITE, LEUKOCYTESUR in the last 72 hours.  Invalid input(s): APPERANCEUR    Imaging: Dg Abd 2 Views  Result Date: 11/14/2018 CLINICAL DATA:  Small bowel obstruction EXAM: ABDOMEN - 2 VIEW COMPARISON:  11/12/2018 CT FINDINGS: NG tube is in the stomach. Peritoneal dialysis catheter is in the pelvis. Gas and stool noted throughout nondistended colon. No current visualized small bowel distention to suggest small bowel obstruction. No organomegaly or free air. IMPRESSION: Improved bowel gas pattern with no current evidence  for small bowel obstruction. NG tube is in the stomach. Electronically Signed   By: Rolm Baptise M.D.   On: 11/14/2018 09:53     Medications:    . allopurinol  100 mg Oral QPM  . benazepril  20 mg Oral Daily  . calcium acetate  1,334 mg Oral TID WC  . calcium acetate  667 mg Oral With snacks  . cinacalcet  90 mg Oral BID WC  . gentamicin cream  1 application Topical Daily  . heparin  5,000 Units Subcutaneous Q8H  . ketoconazole  1 application Topical Once per day on Mon Wed Fri  . lidocaine  1 patch Transdermal Q24H  . metoprolol succinate  50 mg Oral Daily  . multivitamin  1  tablet Oral Daily  . torsemide  100 mg Oral Daily  . valACYclovir  500 mg Oral Q48H     Assessment/ Plan:  [name] is a 57 y.o. [place] male with end stage renal disease on peritoneal dialysis, gout, history of small bowel obstruction, herpes zoster infection   Presents with partial small bowel obstruction. On 11/12/2018   [facility] PD CCPD 9 hours 4 exchanges 2.8 liters. Last fill 2 liters with extraneal solution.   1.  ESRD on peritoneal dialysis.  Tolerated treatment well last night.  Peritoneal dialysis for tonight. Orders prepared.  - hold last fill    2.  Hypertension: blood pressure at goal. Home regimen of benazepril, metoprolol and torsemide.   3.  Anemia of chronic kidney disease.   - epo as outpatient.   4.  Secondary hyperparathyroidism.   -  phos binders and cinacalcet restarted.    LOS: 3 Donatella Walski 12/12/20192:13 PM

## 2018-11-16 LAB — BODY FLUID CULTURE: Culture: NO GROWTH

## 2018-11-16 MED ORDER — PREGABALIN 25 MG PO CAPS: 25.0000 mg | ORAL_CAPSULE | Freq: Every day | ORAL | 2 refills | Status: DC

## 2018-11-16 NOTE — Progress Notes (Signed)
[facility]  ROUNDING NOTE   Subjective:   Peritoneal dialysis overnight. Tolerated treatment well. No UF.   Wife at bedside.   Objective:  Vital signs in last 24 hours:  Temp:  [98.4 F (36.9 C)-98.7 F (37.1 C)] 98.4 F (36.9 C) (12/13 0439) Pulse Rate:  [72-75] 75 (12/13 0439) Resp:  [18-20] 18 (12/13 0439) BP: (105-137)/(71-84) 137/84 (12/13 0439) SpO2:  [99 %] 99 % (12/13 0439) Weight:  [72 kg] 72 kg (12/13 0500)  Weight change: -0.6 kg Filed Weights   11/14/18 0500 11/15/18 0500 11/16/18 0500  Weight: 67.7 kg 72.6 kg 72 kg    Intake/Output: I/O last 3 completed shifts: In: 67 [P.O.:600; Other:22400] Out: 186 [Urine:150; Other:36]   Intake/Output this shift:  Total I/O In: 240 [P.O.:240] Out: 336 [Other:336]  Physical Exam: General: NAD,   Head: NGT tube to suction  Eyes: Anicteric, PERRL  Neck: Supple, trachea midline  Lungs:  Clear to auscultation  Heart: Regular rate and rhythm  Abdomen:  Soft, nontender,   Extremities: no peripheral edema.  Neurologic: Nonfocal, moving all four extremities  Skin: No lesions  Access: PD catheter    Basic Metabolic Panel: Recent Labs  Lab 11/12/18 1539 11/13/18 0426  NA 129* 135  K 6.0* 5.4*  CL 90* 94*  CO2 21* 28  GLUCOSE 171* 158*  BUN 59* 61*  CREATININE 10.39* 9.57*  CALCIUM 9.4 8.4*    Liver Function Tests: Recent Labs  Lab 11/12/18 1539  AST 23  ALT 18  ALKPHOS 115  BILITOT 1.0  PROT 7.4  ALBUMIN 4.8   Recent Labs  Lab 11/12/18 1539  LIPASE 62*   No results for input(s): AMMONIA in the last 168 hours.  CBC: Recent Labs  Lab 11/12/18 1539 11/13/18 0426  WBC 18.0* 14.0*  NEUTROABS 16.6*  --   HGB 13.6 11.7*  HCT 41.1 35.7*  MCV 99.5 99.4  PLT 309 259    Cardiac Enzymes: Recent Labs  Lab 11/12/18 1539  TROPONINI <0.03    BNP: Invalid input(s): POCBNP  CBG: No results for input(s): GLUCAP in the last 168 hours.  Microbiology: Results for orders  placed or performed during the hospital encounter of 11/12/18  Body fluid culture     Status: None   Collection Time: 11/12/18  9:51 PM  Result Value Ref Range Status   Specimen Description PERITONEAL FLUID  Final   Special Requests IN BAG  Final   Gram Stain   Final    RARE WBC PRESENT, PREDOMINANTLY MONONUCLEAR NO ORGANISMS SEEN    Culture   Final    NO GROWTH 3 DAYS Performed at [facility], [place] [address] [place], [place] [address]    Report Status 11/16/2018 FINAL  Final    Coagulation Studies: No results for input(s): LABPROT, INR in the last 72 hours.  Urinalysis: No results for input(s): COLORURINE, LABSPEC, PHURINE, GLUCOSEU, HGBUR, BILIRUBINUR, KETONESUR, PROTEINUR, UROBILINOGEN, NITRITE, LEUKOCYTESUR in the last 72 hours.  Invalid input(s): APPERANCEUR    Imaging: No results found.   Medications:    . allopurinol  100 mg Oral QPM  . benazepril  20 mg Oral Daily  . calcium acetate  1,334 mg Oral TID WC  . calcium acetate  667 mg Oral With snacks  . cinacalcet  90 mg Oral BID WC  . gentamicin cream  1 application Topical Daily  . heparin  5,000 Units Subcutaneous Q8H  . ketoconazole  1 application Topical Once per day  on Mon Wed Fri  . lidocaine  1 patch Transdermal Q24H  . metoprolol succinate  50 mg Oral Daily  . multivitamin  1 tablet Oral Daily  . torsemide  100 mg Oral Daily  . valACYclovir  500 mg Oral Q48H     Assessment/ Plan:  [name] is a 57 y.o. [place] male with end stage renal disease on peritoneal dialysis, gout, history of small bowel obstruction, herpes zoster infection   Presents with partial small bowel obstruction. On 11/12/2018   [facility] Matthew Mathis PD CCPD 9 hours 4 exchanges 2.8 liters. Last fill 2 liters with extraneal solution.   1.  ESRD on peritoneal dialysis.  Tolerated treatment well last night.    2.  Hypertension: blood pressure at goal. Home regimen of benazepril, metoprolol and torsemide.   3.   Anemia of chronic kidney disease.   - epo as outpatient.   4.  Secondary hyperparathyroidism.   -  phos binders and cinacalcet     LOS: 4 Matthew Mathis 12/13/201912:59 PM

## 2018-11-16 NOTE — Care Management (Signed)
Amanda Morris dialysis liaison notified of discharge  

## 2018-11-16 NOTE — Progress Notes (Signed)
[facility] SURGICAL PROGRESS NOTE (cpt [phone])  Hospital Day(s): 4.   Post op day(s):  [name]   Interval History: Patient seen and examined, no acute events or new complaints overnight. Patient reports continued resolution in his abdominal pain. No complaints of fever, chills, nausea, or emesis. Tolerating renal diet. Mobilizing. No issues with PD.   Review of Systems:  Constitutional: denies fever, chills  HEENT: denies cough or congestion  Respiratory: denies any shortness of breath  Cardiovascular: denies chest pain or palpitations  Gastrointestinal: denies abdominal pain, N/V, or diarrhea/and bowel function as per interval history Genitourinary: denies burning with urination or urinary frequency Musculoskeletal: denies pain, decreased motor or sensation Integumentary: denies any other rashes or skin discolorations  Neurological: denies HA or vision/hearing changes   Vital signs in last 24 hours: [min-max] current  Temp:  [97.6 F (36.4 C)-98.7 F (37.1 C)] 98.4 F (36.9 C) (12/13 0439) Pulse Rate:  [72-91] 75 (12/13 0439) Resp:  [18-20] 18 (12/13 0439) BP: (105-137)/(64-84) 137/84 (12/13 0439) SpO2:  [98 %-99 %] 99 % (12/13 0439) Weight:  [72 kg] 72 kg (12/13 0500)     Height: 5\' 6"  (167.6 cm) Weight: 72 kg BMI (Calculated): 25.63   Intake/Output this shift:  No intake/output data recorded.   Intake/Output last 2 shifts:  @IOLAST2SHIFTS @   Physical Exam:  Constitutional: alert, cooperative and no distress  HENT: normocephalic without obvious abnormality  Eyes: EOM's grossly intact and symmetric  Respiratory: breathing non-labored at rest  Gastrointestinal: Soft,non-tender, non-distended. Peritoneal dialysis catheter in RLQ, multiple well healed abdominal surgical incisions Musculoskeletal: no edema or wounds, motor and sensation grossly intact, NT    Labs:  CBC Latest Ref Rng & Units 11/13/2018 11/12/2018 10/19/2018  WBC 4.0 - 10.5 K/uL 14.0(H) 18.0(H)  6.4  Hemoglobin 13.0 - 17.0 g/dL 11.7(L) 13.6 10.4(L)  Hematocrit 39.0 - 52.0 % 35.7(L) 41.1 31.8(L)  Platelets 150 - 400 K/uL 259 309 230   CMP Latest Ref Rng & Units 11/13/2018 11/12/2018 10/16/2018  Glucose 70 - 99 mg/dL 158(H) 171(H) 142(H)  BUN 6 - 20 mg/dL 61(H) 59(H) 52(H)  Creatinine 0.61 - 1.24 mg/dL 9.57(H) 10.39(H) 10.50(H)  Sodium 135 - 145 mmol/L 135 129(L) 132(L)  Potassium 3.5 - 5.1 mmol/L 5.4(H) 6.0(H) 3.7  Chloride 98 - 111 mmol/L 94(L) 90(L) 93(L)  CO2 22 - 32 mmol/L 28 21(L) 25  Calcium 8.9 - 10.3 mg/dL 8.4(L) 9.4 8.1(L)  Total Protein 6.5 - 8.1 g/dL - 7.4 -  Total Bilirubin 0.3 - 1.2 mg/dL - 1.0 -  Alkaline Phos 38 - 126 U/L - 115 -  AST 15 - 41 U/L - 23 -  ALT 0 - 44 U/L - 18 -     Imaging studies: No new pertinent imaging studies   Assessment/Plan:  Matthew Chungis a57 y.o.malewith clinically resolving small bowel obstructionmost likely attributable to post-surgical adhesionswhich is complicated by pertinent comorbidities HTN and chronic renal failure on peritoneal dialysis.    - Continue renal diet   - Pain control as needed (minimize narcotics), anti-emetics  - Monitor abdominal exam and on-going bowel function - No indication for surgical intervention. - Mobilization encouraged - Continue PD, no contraindications currently, nephrology following appreciate their help - Medical management of comorbidities, appreciate Hospitalist help.              - Discharge planning: Clear for discharge from surgical standpoint  -- Edison Simon, PA-C [facility] 11/16/2018, 8:12 AM [phone] M-F: 7am - 4pm

## 2018-11-16 NOTE — Progress Notes (Signed)
Patient is stable for discharge, tolerating the diet, however has postherpetic neuralgia and does not want any narcotics.  Started on Lyrica at a small dose, patient can continue his peritoneal dialysis at home, start Lyrica, continue and finish dose of Valtrex for his shingles, continue peritoneal dialysis at home.  Patient is on kidney transplant wait list at [facility].  Follow-up with [facility] as scheduled.  Patient small bowel obstruction relieved, tolerating the diet.  No further abdominal pain.

## 2018-11-16 NOTE — Progress Notes (Signed)
CCPD completed.  Exit site dressing change completed.

## 2018-11-16 NOTE — Progress Notes (Signed)
Patient cleared for discharge. IV removed. Prescription given. Instructions explained. Questions answered for patient clarification. Discharged to home via POV.

## 2018-11-19 ENCOUNTER — Telehealth: Payer: Self-pay

## 2018-11-19 NOTE — Discharge Summary (Signed)
Matthew Mathis, is a 57 y.o. male  DOB September 18, 1961  MRN [id number].  Admission date:  11/16/2018  Admitting Physician  Gorden Harms, MD  Discharge Date:  11/19/2018   Primary MD  Alexis Goodell, MD  Recommendations for primary care physician for things to follow:   Follow-up with PCP as scheduled Follow-up with nephrology as scheduled, patient gets peritoneal dialysis. Patient is empirically started.  Transplant at [facility].   Admission Diagnosis  Hyperkalemia [E87.5] Hyponatremia [E87.1] SBO (small bowel obstruction) (HCC) [K56.609] Increased anion gap metabolic acidosis [H85.2]   Discharge Diagnosis  Hyperkalemia [E87.5] Hyponatremia [E87.1] SBO (small bowel obstruction) (HCC) [K56.609] Increased anion gap metabolic acidosis [D78.2]    Active Problems:   SBO (small bowel obstruction) ([place])      Past Medical History:  Diagnosis Date  . Chronic renal failure    hx   . Gout    hx  . HTN (hypertension)     Past Surgical History:  Procedure Laterality Date  . CAPD INSERTION Right 05/24/2017   Procedure: LAPAROSCOPIC INSERTION CONTINUOUS AMBULATORY PERITONEAL DIALYSIS  (CAPD) CATHETER;  Surgeon: Katha Cabal, MD;  Location: [facility] ORS;  Service: Vascular;  Laterality: Right;  . DIALYSIS/PERMA CATHETER INSERTION N/A 05/07/2018   Procedure: DIALYSIS/PERMA CATHETER INSERTION;  Surgeon: Algernon Huxley, MD;  Location: [place] [facility];  Service: Cardiovascular;  Laterality: N/A;  . DIALYSIS/PERMA CATHETER REMOVAL N/A 06/27/2017   Procedure: Dialysis/Perma Catheter Removal;  Surgeon: Katha Cabal, MD;  Location: [place] [facility];  Service: Cardiovascular;  Laterality: N/A;  . DIALYSIS/PERMA CATHETER REMOVAL N/A 07/18/2018   Procedure: DIALYSIS/PERMA CATHETER REMOVAL;  Surgeon: Katha Cabal, MD;   Location: [place] [facility];  Service: Cardiovascular;  Laterality: N/A;  . INSERTION OF DIALYSIS CATHETER Right 05/24/2017   Procedure: INSERTION OF DIALYSIS CATHETER ( TUNNELED CATH );  Surgeon: Katha Cabal, MD;  Location: [facility] ORS;  Service: Vascular;  Laterality: Right;  . PERITONEAL CATHETER INSERTION    . REMOVAL OF A DIALYSIS CATHETER Left 05/24/2017   Procedure: REMOVAL OF A DIALYSIS CATHETER ( PD CATH );  Surgeon: Katha Cabal, MD;  Location: [facility] ORS;  Service: Vascular;  Laterality: Left;  . RENAL BIOPSY     non specific        History of present illness and  Hospital Course:     Kindly see H&P for history of present illness and admission details, please review complete Labs, Consult reports and Test reports for all details in brief  HPI  from the history and physical done on the day of admission  57 year old male patient with history of ESRD on peritoneal dialysis, recent shingles in the left chest comes in because of abdominal pain found to have small bowel obstruction.  Hospital Course   #1 small bowel obstruction, likely due to postsurgical adhesions, patient seen by surgery, treated conservatively, patient abdominal pain resolved, repeat x-rays of abdomen did not show any small bowel obstruction, he tolerated the diet.  Surgery said that patient can be discharged home and follow-up with him as needed. 2.  ESRD), seen by nephrology. 3.  History of pressure shingles on the left side of the chest, received Valtrex, patient advised to finish Valtrex, started on low-dose Lyrica to help with neuropathic pain.  Patient to lots of pain in the hospital but did not want narcotics wanted only Tylenol. #4 hyperkalemia, hyponatremia: Improved.  Discharge Condition: Stable   Follow UP      Discharge  Instructions  and  Discharge Medications      Allergies as of 11/16/2018   No Known Allergies     Medication List    TAKE these medications   allopurinol  100 MG tablet Commonly known as:  ZYLOPRIM Take 100 mg by mouth every evening.   benazepril 20 MG tablet Commonly known as:  LOTENSIN Take 20 mg by mouth daily.   calcium acetate 667 MG capsule Commonly known as:  PHOSLO Take 667-1,334 mg by mouth See admin instructions. Take 1334 mg by mouth three times a day and take 667 mg by mouth with snacks.   cinacalcet 30 MG tablet Commonly known as:  SENSIPAR Take 90 mg by mouth 2 (two) times daily with a meal.   DIALYVITE 800/ULTRA D Tabs Take 1 tablet by mouth daily.   ketoconazole 2 % shampoo Commonly known as:  NIZORAL Apply 1 application topically 3 (three) times a week.   metoprolol succinate 50 MG 24 hr tablet Commonly known as:  TOPROL-XL Take 1 tablet (50 mg total) by mouth daily. Take with or immediately following a meal.   mupirocin ointment 2 % Commonly known as:  BACTROBAN Apply 1 application topically daily as needed. Apply to exit site   pregabalin 25 MG capsule Commonly known as:  LYRICA Take 1 capsule (25 mg total) by mouth daily.   torsemide 100 MG tablet Commonly known as:  DEMADEX Take 100 mg by mouth daily.   triamcinolone cream 0.1 % Commonly known as:  KENALOG Apply 1 application topically 2 (two) times daily as needed (for inflammation).     ASK your doctor about these medications   valACYclovir 500 MG tablet Commonly known as:  VALTREX Take 1 tablet (500 mg total) by mouth every other day for 15 doses. Ask about: Should I take this medication?         Diet and Activity recommendation: See Discharge Instructions above   Consults obtained -surgery, nephrology   Major procedures and Radiology Reports - PLEASE review detailed and final reports for all details, in brief -      Ct Abdomen Pelvis Wo Contrast  Result Date: 11/12/2018 CLINICAL DATA:  Abdominal pain. Vomiting. Chronic renal failure. EXAM: CT ABDOMEN AND PELVIS WITHOUT CONTRAST TECHNIQUE: Multidetector CT imaging of the  abdomen and pelvis was performed following the standard protocol without IV contrast. COMPARISON:  CT scan dated 05/03/2018 FINDINGS: Lower chest: No acute abnormality. Aortic atherosclerosis. Calcification of the aortic valve. Hepatobiliary: No focal liver abnormality is seen. No gallstones, gallbladder wall thickening, or biliary dilatation. Pancreas: Unremarkable. No pancreatic ductal dilatation or surrounding inflammatory changes. Spleen: Normal in size without focal abnormality. Adrenals/Urinary Tract: Normal adrenal glands. Chronic cystic changes and calcifications in both kidneys. No excretion of contrast from the kidneys. There is fluid in the bladder. Stomach/Bowel: There multiple dilated loops of proximal small bowel with a transition point in the right lower quadrant. Distal small bowel is not distended. Colon is normal. Stomach is slightly distended but the duodenum is not distended. Appendix is normal. Vascular/Lymphatic: Aortic atherosclerosis. No enlarged abdominal or pelvic lymph nodes. Reproductive: Prostate is unremarkable. Other: Small amount of fluid in the pelvis around the peritoneal dialysis catheter, the tip of which is in the mid pelvis. Musculoskeletal: No acute abnormality. Slight sclerosis of the bones consistent with the patient's history of chronic renal failure. IMPRESSION: Partial or early complete small bowel obstruction with a transition point in the right lower quadrant, similar to the appearance on the prior  study of 05/03/2018. Aortic Atherosclerosis (ICD10-I70.0). Electronically Signed   By: Lorriane Shire M.D.   On: 11/12/2018 16:22   Dg Abd 2 Views  Result Date: 11/14/2018 CLINICAL DATA:  Small bowel obstruction EXAM: ABDOMEN - 2 VIEW COMPARISON:  11/12/2018 CT FINDINGS: NG tube is in the stomach. Peritoneal dialysis catheter is in the pelvis. Gas and stool noted throughout nondistended colon. No current visualized small bowel distention to suggest small bowel  obstruction. No organomegaly or free air. IMPRESSION: Improved bowel gas pattern with no current evidence for small bowel obstruction. NG tube is in the stomach. Electronically Signed   By: Rolm Baptise M.D.   On: 11/14/2018 09:53    Micro Results    Recent Results (from the past 240 hour(s))  Body fluid culture     Status: None   Collection Time: 11/12/18  9:51 PM  Result Value Ref Range Status   Specimen Description PERITONEAL FLUID  Final   Special Requests IN BAG  Final   Gram Stain   Final    RARE WBC PRESENT, PREDOMINANTLY MONONUCLEAR NO ORGANISMS SEEN    Culture   Final    NO GROWTH 3 DAYS Performed at [facility], [place] [address] [place], [place] [address]    Report Status 11/16/2018 FINAL  Final       Today   Subjective:   Matthew Mathis today has no headache,no chest abdominal pain,no new weakness tingling or numbness, feels much better wants to go home today.  Objective:   Blood pressure 137/84, pulse 75, temperature 98.4 F (36.9 C), temperature source Oral, resp. rate 18, height 5\' 6"  (1.676 m), weight 72 kg, SpO2 99 %.  No intake or output data in the 24 hours ending 11/19/18 2035  Exam Awake Alert, Oriented x 3, No new F.N deficits, Normal affect [place].AT,PERRAL Supple Neck,No JVD, No cervical lymphadenopathy appriciated.  Symmetrical Chest wall movement, Good air movement bilaterally, CTAB RRR,No Gallops,Rubs or new Murmurs, No Parasternal Heave +ve B.Sounds, Abd Soft, Non tender, No organomegaly appriciated, No rebound -guarding or rigidity. No Cyanosis, Clubbing or edema, No new Rash or bruise  Data Review   CBC w Diff:  Lab Results  Component Value Date   WBC 14.0 (H) 11/13/2018   HGB 11.7 (L) 11/13/2018   HGB 12.5 (L) 01/02/2013   HCT 35.7 (L) 11/13/2018   HCT 37.1 (L) 01/02/2013   PLT 259 11/13/2018   PLT 266 01/02/2013   LYMPHOPCT 1 11/12/2018   MONOPCT 5 11/12/2018   EOSPCT 1 11/12/2018   BASOPCT 0 11/12/2018    CMP:  Lab  Results  Component Value Date   NA 135 11/13/2018   NA 136 01/02/2013   K 5.4 (H) 11/13/2018   K 3.4 (L) 01/02/2013   CL 94 (L) 11/13/2018   CL 99 01/02/2013   CO2 28 11/13/2018   CO2 25 01/02/2013   BUN 61 (H) 11/13/2018   BUN 71 (H) 01/02/2013   CREATININE 9.57 (H) 11/13/2018   CREATININE 10.21 (H) 01/02/2013   PROT 7.4 11/12/2018   PROT 7.2 01/02/2013   ALBUMIN 4.8 11/12/2018   ALBUMIN 3.7 01/02/2013   BILITOT 1.0 11/12/2018   BILITOT 0.6 01/02/2013   ALKPHOS 115 11/12/2018   ALKPHOS 81 01/02/2013   AST 23 11/12/2018   AST 13 (L) 01/02/2013   ALT 18 11/12/2018   ALT 32 01/02/2013  .   Total Time in preparing paper work, data evaluation and todays exam - 35 minutes  Epifanio Lesches M.D on 11/16/2018 at 8:35 PM    Note: This dictation was prepared with Dragon dictation along with smaller phrase technology. Any transcriptional errors that result from this process are unintentional.

## 2018-11-19 NOTE — Telephone Encounter (Signed)
Flagged on EMMI report for not having a follow up scheduled.  No follow up appointments listed on AVS.  Dr. Governor Specking note mentions to follow up with [facility] as scheduled.  I called and spoke with patient, however he was not aware of any upcoming follow up appointments with anyone.  Relayed Dr. Governor Specking instruction for follow up with [facility] as scheduled.  Encouraged patient to schedule appointment if needed.  No other questions or concerns at this time.  I thanked him for his time and informed him he would receive one more automated call checking in over the next few days.

## 2018-11-21 DIAGNOSIS — I959 Hypotension, unspecified: Principal | ICD-10-CM

## 2018-11-22 ENCOUNTER — Ambulatory Visit
Admit: 2018-11-22 | Discharge: 2018-11-28 | Disposition: A | Discharge status: Home / Self Care | Payer: MEDICARE | Admitting: Surgery

## 2018-11-22 ENCOUNTER — Encounter
Admit: 2018-11-22 | Discharge: 2018-11-28 | Disposition: A | Discharge status: Home / Self Care | Payer: MEDICARE | Attending: Certified Registered" | Admitting: Surgery

## 2018-11-22 DIAGNOSIS — I959 Hypotension, unspecified: Principal | ICD-10-CM

## 2018-11-24 DIAGNOSIS — I959 Hypotension, unspecified: Principal | ICD-10-CM

## 2018-11-26 DIAGNOSIS — I959 Hypotension, unspecified: Principal | ICD-10-CM

## 2018-11-28 MED ORDER — OXYCODONE 10 MG TABLET
ORAL_TABLET | ORAL | 0 refills | 0 days | Status: CP | PRN
Start: 2018-11-28 — End: 2018-12-12
  Filled 2018-11-28: qty 20, 4d supply, fill #0

## 2018-11-28 MED ORDER — ACETAMINOPHEN 325 MG TABLET
Freq: Four times a day (QID) | ORAL | 0 refills | 0.00000 days | Status: SS
Start: 2018-11-28 — End: 2019-05-27

## 2018-11-28 MED FILL — OXYCODONE 10 MG TABLET: 4 days supply | Qty: 20 | Fill #0 | Status: AC

## 2018-12-12 ENCOUNTER — Ambulatory Visit: Admit: 2018-12-12 | Discharge: 2018-12-13 | Payer: MEDICARE | Attending: Trauma Surgery | Primary: Trauma Surgery

## 2018-12-12 ENCOUNTER — Ambulatory Visit: Admit: 2018-12-12 | Discharge: 2018-12-13 | Payer: MEDICARE

## 2018-12-12 DIAGNOSIS — I120 Hypertensive chronic kidney disease with stage 5 chronic kidney disease or end stage renal disease: Principal | ICD-10-CM

## 2018-12-12 DIAGNOSIS — Z9889 Other specified postprocedural states: Principal | ICD-10-CM

## 2018-12-14 ENCOUNTER — Ambulatory Visit (INDEPENDENT_AMBULATORY_CARE_PROVIDER_SITE_OTHER): Payer: Medicare Other | Admitting: Nurse Practitioner

## 2018-12-14 ENCOUNTER — Other Ambulatory Visit (INDEPENDENT_AMBULATORY_CARE_PROVIDER_SITE_OTHER): Payer: Self-pay | Admitting: Vascular Surgery

## 2018-12-14 ENCOUNTER — Other Ambulatory Visit (INDEPENDENT_AMBULATORY_CARE_PROVIDER_SITE_OTHER): Payer: Self-pay | Admitting: Nurse Practitioner

## 2018-12-14 ENCOUNTER — Ambulatory Visit (INDEPENDENT_AMBULATORY_CARE_PROVIDER_SITE_OTHER): Payer: Medicare Other

## 2018-12-14 ENCOUNTER — Encounter (INDEPENDENT_AMBULATORY_CARE_PROVIDER_SITE_OTHER): Payer: Self-pay | Admitting: Nurse Practitioner

## 2018-12-14 VITALS — BP 160/89 | HR 67 | Resp 16 | Ht 66.0 in | Wt 154.0 lb

## 2018-12-14 DIAGNOSIS — M7989 Other specified soft tissue disorders: Secondary | ICD-10-CM

## 2018-12-14 DIAGNOSIS — N186 End stage renal disease: Secondary | ICD-10-CM | POA: Diagnosis not present

## 2018-12-14 DIAGNOSIS — I10 Essential (primary) hypertension: Secondary | ICD-10-CM | POA: Diagnosis not present

## 2018-12-14 DIAGNOSIS — T829XXA Unspecified complication of cardiac and vascular prosthetic device, implant and graft, initial encounter: Secondary | ICD-10-CM | POA: Diagnosis not present

## 2018-12-21 ENCOUNTER — Encounter (INDEPENDENT_AMBULATORY_CARE_PROVIDER_SITE_OTHER): Payer: Self-pay | Admitting: Nurse Practitioner

## 2018-12-21 DIAGNOSIS — M7989 Other specified soft tissue disorders: Secondary | ICD-10-CM | POA: Insufficient documentation

## 2018-12-21 NOTE — Progress Notes (Signed)
Subjective:    Patient ID: Matthew Mathis, male    DOB: Aug 01, 1961, 58 y.o.   MRN: [id number] Chief Complaint  Patient presents with  . Follow-up    HPI  Matthew Mathis is a 58 y.o. male that presents today after recent hospitalization.  Following this hospitalization the patient had swelling of his right upper extremity.  He was referred due to swelling of his right upper extremity.  The patient states that today the swelling has decreased greatly however it was much more swollen and painful after discharge.  He also states that there was an IV in that arm where it became swollen and painful.  The patient is also a end-stage renal disease patient is currently maintained a PermCath.  The patient states that he is soon to have a kidney transplant however due to his recent surgery it was delayed by 4 weeks.  The patient states that he cannot have any surgery 8 weeks prior to his transplant.  Patient denies any fever, chills, nausea, vomiting or diarrhea.  Patient denies any current issues with his PermCath.  He denies any chest pain or shortness of breath.  Patient underwent an upper extremity venous duplex.  No evidence of DVT or superficial venous thrombosis was found in the right upper extremity.  The patient also underwent bilateral upper extremity vein mapping for AV fistula/graft placement due to the patient's previous peritoneal dialysis catheter.  Based upon his vein mapping results, brachial axillary graft can be placed in either right or left upper extremity. Past Medical History:  Diagnosis Date  . Chronic renal failure    hx   . Gout    hx  . HTN (hypertension)     Past Surgical History:  Procedure Laterality Date  . CAPD INSERTION Right 05/24/2017   Procedure: LAPAROSCOPIC INSERTION CONTINUOUS AMBULATORY PERITONEAL DIALYSIS  (CAPD) CATHETER;  Surgeon: Katha Cabal, MD;  Location: [facility];  Service: Vascular;  Laterality: Right;  . DIALYSIS/PERMA CATHETER INSERTION N/A  05/07/2018   Procedure: DIALYSIS/PERMA CATHETER INSERTION;  Surgeon: Algernon Huxley, MD;  Location: [place] [facility];  Service: Cardiovascular;  Laterality: N/A;  . DIALYSIS/PERMA CATHETER REMOVAL N/A 06/27/2017   Procedure: Dialysis/Perma Catheter Removal;  Surgeon: Katha Cabal, MD;  Location: [place] [facility];  Service: Cardiovascular;  Laterality: N/A;  . DIALYSIS/PERMA CATHETER REMOVAL N/A 07/18/2018   Procedure: DIALYSIS/PERMA CATHETER REMOVAL;  Surgeon: Katha Cabal, MD;  Location: [place] [facility];  Service: Cardiovascular;  Laterality: N/A;  . INSERTION OF DIALYSIS CATHETER Right 05/24/2017   Procedure: INSERTION OF DIALYSIS CATHETER ( TUNNELED CATH );  Surgeon: Katha Cabal, MD;  Location: [facility];  Service: Vascular;  Laterality: Right;  . PERITONEAL CATHETER INSERTION    . REMOVAL OF A DIALYSIS CATHETER Left 05/24/2017   Procedure: REMOVAL OF A DIALYSIS CATHETER ( PD CATH );  Surgeon: Katha Cabal, MD;  Location: [facility];  Service: Vascular;  Laterality: Left;  . RENAL BIOPSY     non specific     Social History   Socioeconomic History  . Marital status: Married    Spouse name: Not on file  . Number of children: Not on file  . Years of education: Not on file  . Highest education level: Not on file  Occupational History  . Not on file  Social Needs  . Financial resource strain: Not on file  . Food insecurity:    Worry: Not on file    Inability: Not  on file  . Transportation needs:    Medical: Not on file    Non-medical: Not on file  Tobacco Use  . Smoking status: Former Smoker    Packs/day: 0.50    Types: Cigarettes    Last attempt to quit: 12/04/2010    Years since quitting: 8.0  . Smokeless tobacco: Never Used  . Tobacco comment: used to smoke 2-3 ppd, now smokes 3 cigarettes daily   Substance and Sexual Activity  . Alcohol use: No  . Drug use: No  . Sexual activity: Not on file  Lifestyle  . Physical activity:    Days per week:  Not on file    Minutes per session: Not on file  . Stress: Not on file  Relationships  . Social connections:    Talks on phone: Not on file    Gets together: Not on file    Attends religious service: Not on file    Active member of club or organization: Not on file    Attends meetings of clubs or organizations: Not on file    Relationship status: Not on file  . Intimate partner violence:    Fear of current or ex partner: Not on file    Emotionally abused: Not on file    Physically abused: Not on file    Forced sexual activity: Not on file  Other Topics Concern  . Not on file  Social History Narrative   Works for a [employer]     Family History  Problem Relation Age of Onset  . Diabetes Father   . CAD Father   . Hypertension Mother        brother, sister   . Kidney disease Mother     No Known Allergies   Review of Systems   Review of Systems: Negative Unless Checked Constitutional: [] Weight loss  [] Fever  [] Chills Cardiac: [] Chest pain   []  Atrial Fibrillation  [] Palpitations   [] Shortness of breath when laying flat   [] Shortness of breath with exertion. [] Shortness of breath at rest Vascular:  [] Pain in legs with walking   [] Pain in legs with standing [] Pain in legs when laying flat   [] Claudication    [] Pain in feet when laying flat    [] History of DVT   [] Phlebitis   [] Swelling in legs   [] Varicose veins   [] Non-healing ulcers Pulmonary:   [] Uses home oxygen   [] Productive cough   [] Hemoptysis   [] Wheeze  [] COPD   [] Asthma Neurologic:  [] Dizziness   [] Seizures  [] Blackouts [] History of stroke   [] History of TIA  [] Aphasia   [] Temporary Blindness   [] Weakness or numbness in arm   [] Weakness or numbness in leg Musculoskeletal:   [] Joint swelling   [] Joint pain   [] Low back pain  []  History of Knee Replacement [] Arthritis [] back Surgeries  []  Spinal Stenosis    Hematologic:  [] Easy bruising  [] Easy bleeding   [] Hypercoagulable state   [x] Anemic Gastrointestinal:   [] Diarrhea   [] Vomiting  [] Gastroesophageal reflux/heartburn   [] Difficulty swallowing. [] Abdominal pain Genitourinary:  [x] Chronic kidney disease   [] Difficult urination  [] Anuric   [] Blood in urine [] Frequent urination  [] Burning with urination   [] Hematuria Skin:  [] Rashes   [] Ulcers [] Wounds Psychological:  [] History of anxiety   []  History of major depression  []  Memory Difficulties     Objective:   Physical Exam  BP (!) 160/89 (BP Location: Left Arm, Patient Position: Sitting)   Pulse 67   Resp 16  Ht 5\' 6"  (1.676 m)   Wt 154 lb (69.9 kg)   BMI 24.86 kg/m   Gen: WD/WN, NAD Head: [place]/AT, No temporalis wasting.  Ear/Nose/Throat: Hearing grossly intact, nares w/o erythema or drainage Eyes: PER, EOMI, sclera nonicteric.  Neck: Supple, no masses.  No JVD.  Pulmonary:  Good air movement, no use of accessory muscles.  Cardiac: RRR Vascular:  Right arm with 1+ edema and bruising. Vessel Right Left  Radial Palpable Palpable   Gastrointestinal: soft, non-distended. No guarding/no peritoneal signs.  Musculoskeletal: M/S 5/5 throughout.  No deformity or atrophy.  Neurologic: Pain and light touch intact in extremities.  Symmetrical.  Speech is fluent. Motor exam as listed above. Psychiatric: Judgment intact, Mood & affect appropriate for pt's clinical situation. Dermatologic: No Venous rashes. No Ulcers Noted.  No changes consistent with cellulitis. Lymph : No Cervical lymphadenopathy, no lichenification or skin changes of chronic lymphedema.      Assessment & Plan:   1. Complication from renal dialysis device, initial encounter The patient does not wish to undergo graft placement at this time.  Due to the fact that if he has surgery it will cause his kidney transplant to be delayed.  He currently has PermCath access which is permissible for the short waiting time for the patient.  He was advised to contact us if it is found that he is not receiving a kidney transplant and finds  that he has need for longer term dialysis access.  2. HYPERTENSION, BENIGN Continue antihypertensive medications as already ordered, these medications have been reviewed and there are no changes at this time.   3. Swelling of arm The patient endorses that the swelling is greatly decreased since he has gotten out of the hospital until today's visit.  Given the bruising and the IV placement, I feel that this is the likely causative factor.  The patient will follow-up with [place] if the swelling increases or returns.   Current Outpatient Medications on File Prior to Visit  Medication Sig Dispense Refill  . allopurinol (ZYLOPRIM) 100 MG tablet Take 100 mg by mouth every evening.     . benazepril (LOTENSIN) 20 MG tablet Take 20 mg by mouth daily.  11  . calcium acetate (PHOSLO) 667 MG capsule Take 667-1,334 mg by mouth See admin instructions. Take 1334 mg by mouth three times a day and take 667 mg by mouth with snacks.    . cinacalcet (SENSIPAR) 30 MG tablet Take 90 mg by mouth 2 (two) times daily with a meal.     . ketoconazole (NIZORAL) 2 % shampoo Apply 1 application topically 3 (three) times a week.   5  . metoprolol succinate (TOPROL-XL) 50 MG 24 hr tablet Take 1 tablet (50 mg total) by mouth daily. Take with or immediately following a meal. 30 tablet 0  . Multiple Vitamins-Minerals (DIALYVITE 800/ULTRA D) TABS Take 1 tablet by mouth daily.  3  . mupirocin ointment (BACTROBAN) 2 % Apply 1 application topically daily as needed. Apply to exit site  99  . pregabalin (LYRICA) 25 MG capsule Take 1 capsule (25 mg total) by mouth daily. 90 capsule 2  . torsemide (DEMADEX) 100 MG tablet Take 100 mg by mouth daily.  12  . triamcinolone cream (KENALOG) 0.1 % Apply 1 application topically 2 (two) times daily as needed (for inflammation).      No current facility-administered medications on file prior to visit.     There are no Patient Instructions on file for this  visit. Return if symptoms worsen or  fail to improve.   Kris Hartmann, NP  This note was completed with Sales executive.  Any errors are purely unintentional.

## 2018-12-26 ENCOUNTER — Ambulatory Visit: Admit: 2018-12-26 | Discharge: 2018-12-27 | Payer: MEDICARE | Attending: Family | Primary: Family

## 2018-12-26 DIAGNOSIS — N186 End stage renal disease: Secondary | ICD-10-CM

## 2018-12-26 DIAGNOSIS — B0229 Other postherpetic nervous system involvement: Secondary | ICD-10-CM

## 2018-12-26 DIAGNOSIS — I150 Renovascular hypertension: Principal | ICD-10-CM

## 2018-12-26 MED ORDER — GABAPENTIN 300 MG CAPSULE
ORAL_CAPSULE | Freq: Every day | ORAL | 1 refills | 0 days | Status: CP
Start: 2018-12-26 — End: 2019-01-18

## 2019-01-09 ENCOUNTER — Ambulatory Visit
Admit: 2019-01-09 | Discharge: 2019-01-10 | Payer: MEDICARE | Attending: Transplant Surgery | Primary: Transplant Surgery

## 2019-01-09 DIAGNOSIS — Z01818 Encounter for other preprocedural examination: Principal | ICD-10-CM

## 2019-01-18 MED ORDER — GABAPENTIN 300 MG CAPSULE
ORAL_CAPSULE | Freq: Every day | ORAL | 1 refills | 0.00000 days | Status: CP
Start: 2019-01-18 — End: 2019-04-26

## 2019-02-07 ENCOUNTER — Encounter (INDEPENDENT_AMBULATORY_CARE_PROVIDER_SITE_OTHER): Payer: Self-pay

## 2019-02-09 IMAGING — CT CT ABD-PELV W/O CM
2 of 4 series · 15 of 46 positions shown, 17 images · non-contrast
Comparison: CT scan dated 05/03/2018

CLINICAL DATA: Abdominal pain. Vomiting. Chronic renal failure.

EXAM:
CT ABDOMEN AND PELVIS WITHOUT CONTRAST
TECHNIQUE: Multidetector CT imaging of the abdomen and pelvis was performed
following the standard protocol without IV contrast.

[Series 2: routine abd/pel wo · axial · 0.70mm/px · z∈[-524,-44]mm · 12 of 106 slices shown, 14 images]
[im 5/106  soft-tissue]
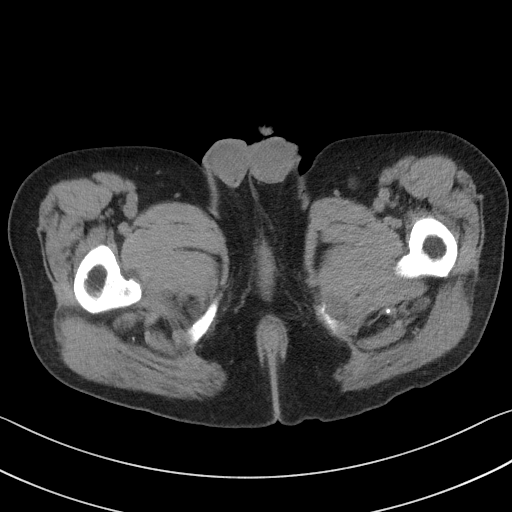
[im 5/106  bone]
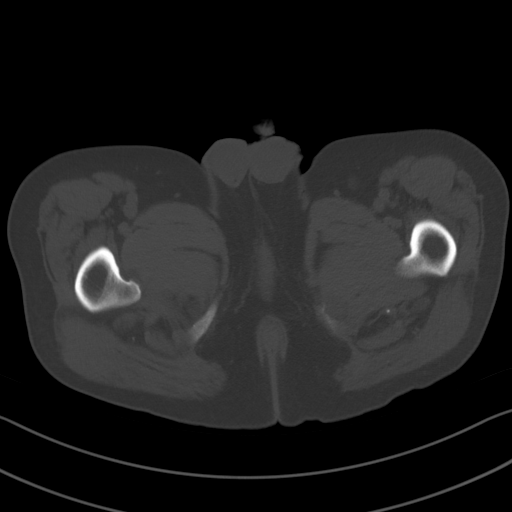
[im 14/106  soft-tissue]
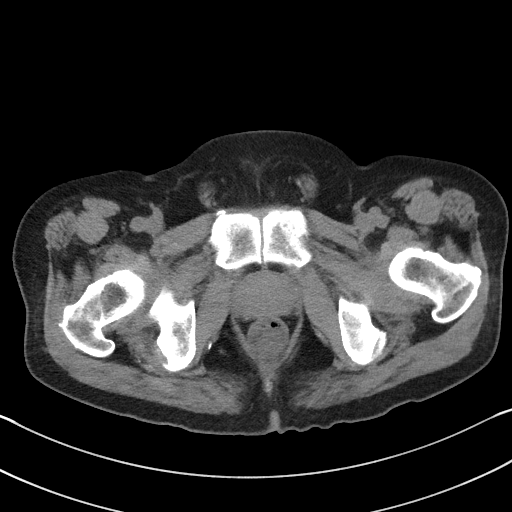
[im 22/106  soft-tissue]
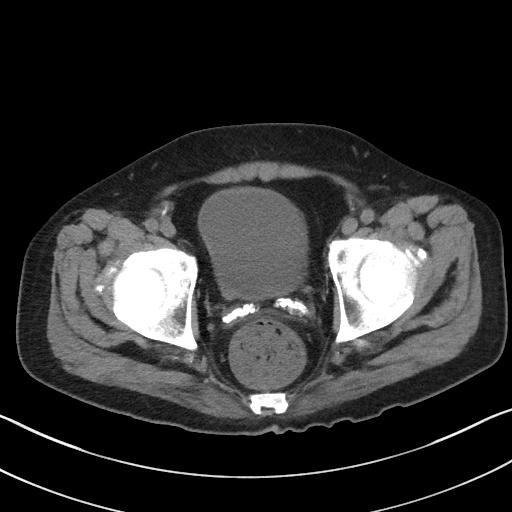
[im 31/106  soft-tissue]
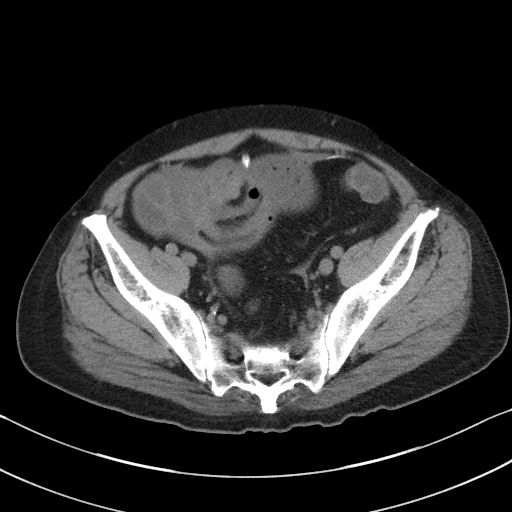
[im 40/106  soft-tissue]
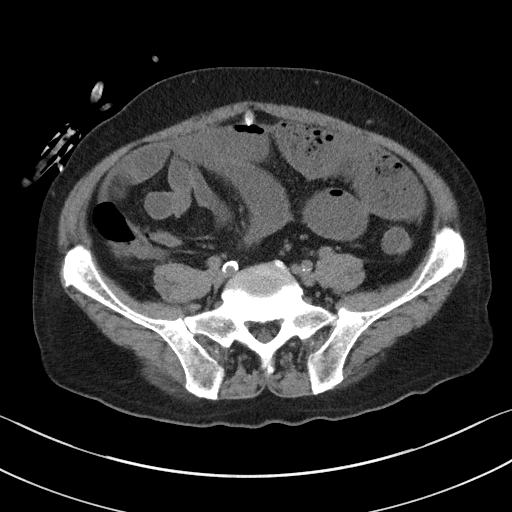
[im 49/106  soft-tissue]
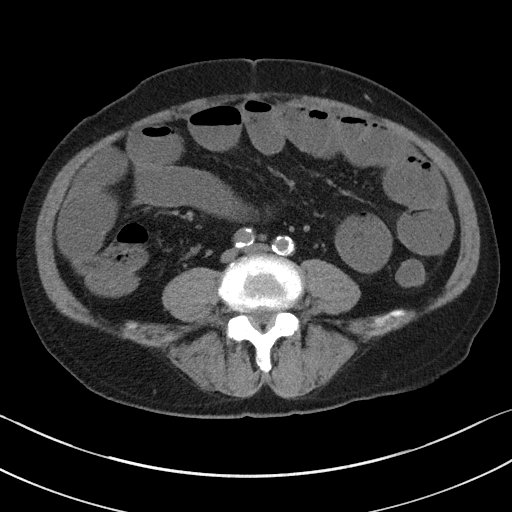
[im 57/106  soft-tissue]
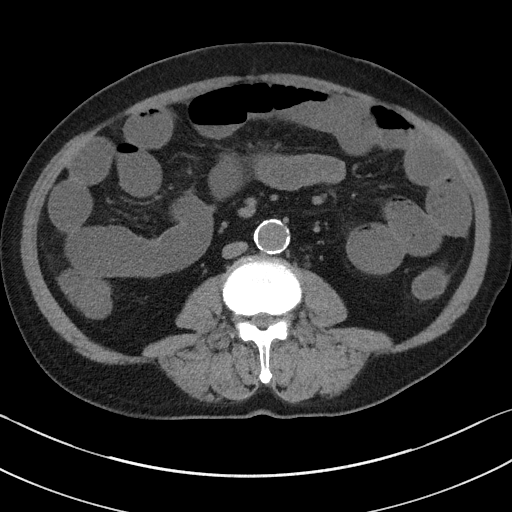
[im 66/106  soft-tissue]
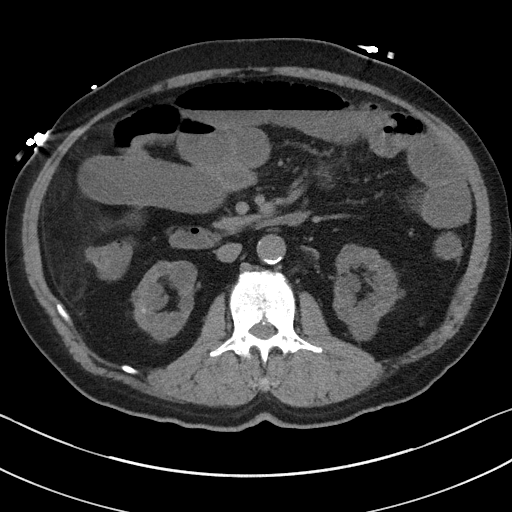
[im 75/106  soft-tissue]
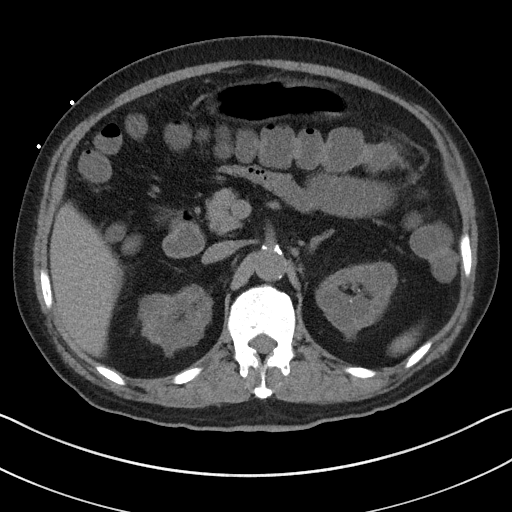
[im 75/106  bone]
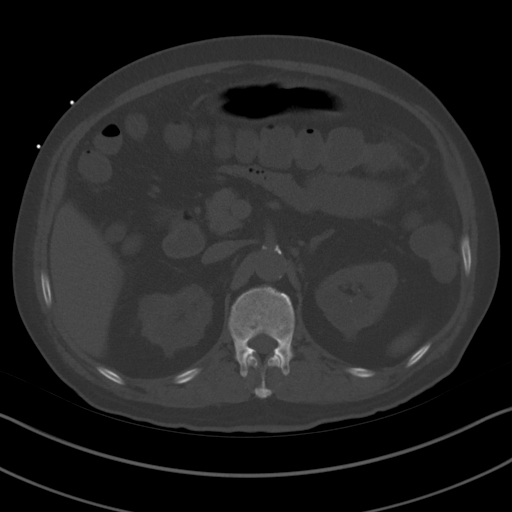
[im 84/106  soft-tissue]
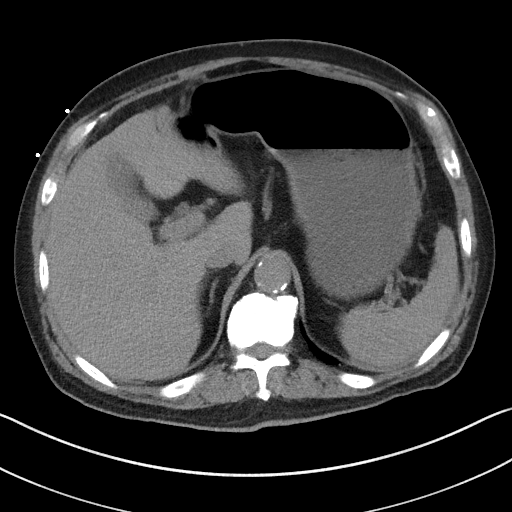
[im 92/106  soft-tissue]
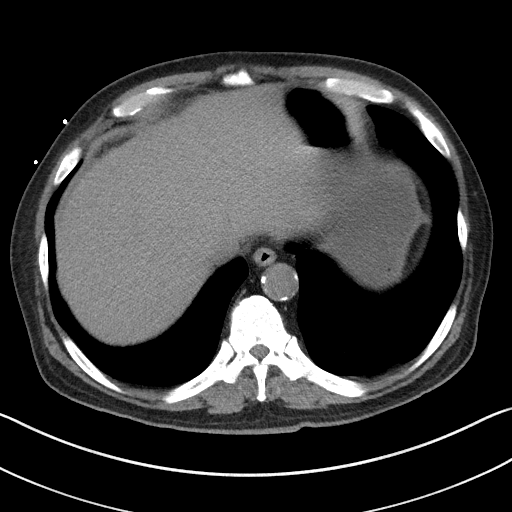
[im 101/106  soft-tissue]
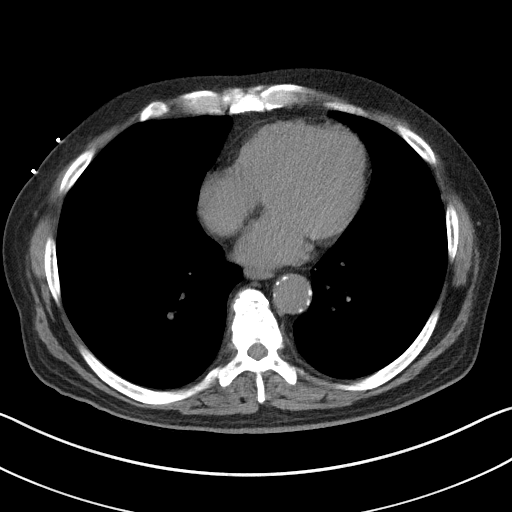

[Series 5: coronal st · coronal · 0.76mm/px · 3 of 94 slices shown]
[im 32/94  soft-tissue]
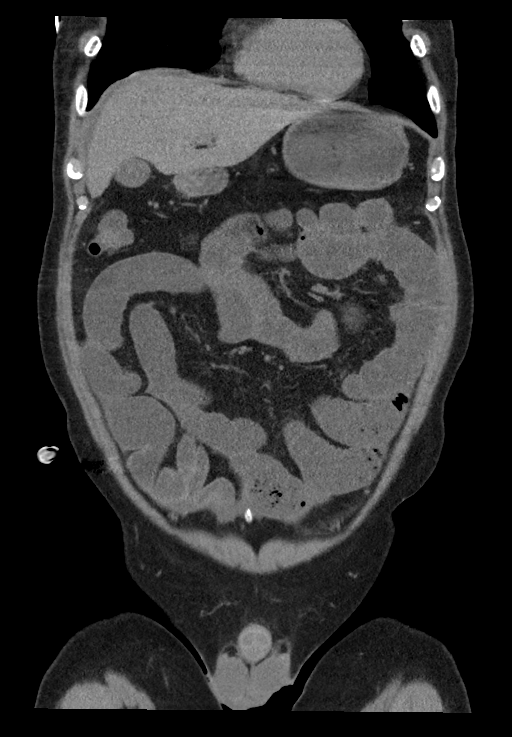
[im 42/94  soft-tissue]
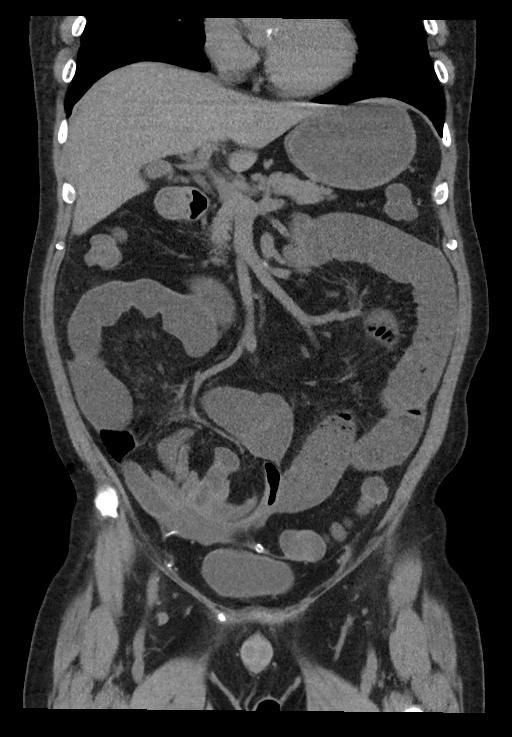
[im 52/94  soft-tissue]
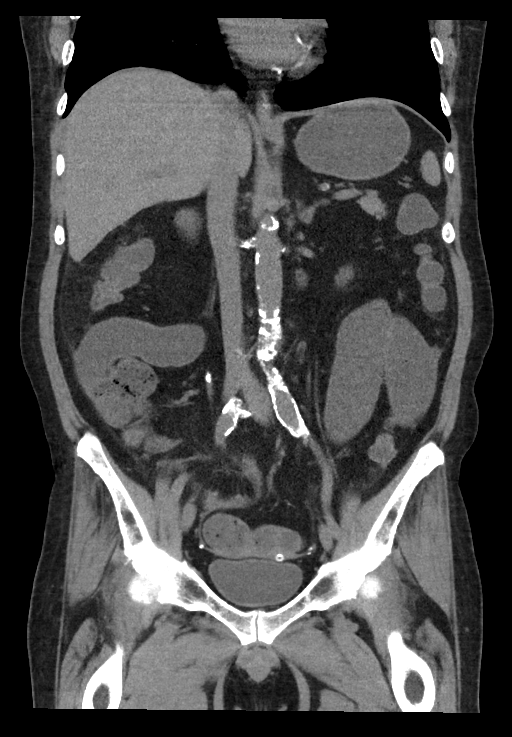

[15 of 46 positions shown; findings below may reference images not displayed]

FINDINGS: Lower chest: No acute abnormality. Aortic atherosclerosis.
Calcification of the aortic valve.

Hepatobiliary: No focal liver abnormality is seen. No gallstones,
gallbladder wall thickening, or biliary dilatation.

Pancreas: Unremarkable. No pancreatic ductal dilatation or
surrounding inflammatory changes.

Spleen: Normal in size without focal abnormality.

Adrenals/Urinary Tract: Normal adrenal glands. Chronic cystic
changes and calcifications in both kidneys. No excretion of contrast
from the kidneys. There is fluid in the bladder.

Stomach/Bowel: There multiple dilated loops of proximal small bowel
with a transition point in the right lower quadrant. Distal small
bowel is not distended. Colon is normal. Stomach is slightly
distended but the duodenum is not distended. Appendix is normal.

Vascular/Lymphatic: Aortic atherosclerosis. No enlarged abdominal or
pelvic lymph nodes.

Reproductive: Prostate is unremarkable.

Other: Small amount of fluid in the pelvis around the peritoneal
dialysis catheter, the tip of which is in the mid pelvis.

Musculoskeletal: No acute abnormality. Slight sclerosis of the bones
consistent with the patient's history of chronic renal failure.
IMPRESSION: Partial or early complete small bowel obstruction with a transition
point in the right lower quadrant, similar to the appearance on the
prior study of 05/03/2018..

Aortic Atherosclerosis ([id number]).

## 2019-03-01 ENCOUNTER — Ambulatory Visit: Admit: 2019-03-01 | Discharge: 2019-03-30 | Payer: MEDICARE

## 2019-03-01 ENCOUNTER — Ambulatory Visit: Admit: 2019-03-01 | Discharge: 2019-03-01 | Payer: MEDICARE

## 2019-03-01 ENCOUNTER — Institutional Professional Consult (permissible substitution): Admit: 2019-03-01 | Discharge: 2019-03-01 | Payer: MEDICARE

## 2019-03-01 ENCOUNTER — Encounter: Admit: 2019-03-01 | Discharge: 2019-03-01 | Payer: MEDICARE | Attending: Surgery | Primary: Surgery

## 2019-03-01 ENCOUNTER — Ambulatory Visit: Admit: 2019-03-01 | Discharge: 2019-03-14 | Payer: MEDICARE

## 2019-03-01 DIAGNOSIS — N186 End stage renal disease: Principal | ICD-10-CM

## 2019-03-01 DIAGNOSIS — I10 Essential (primary) hypertension: Principal | ICD-10-CM

## 2019-03-01 DIAGNOSIS — Z7289 Other problems related to lifestyle: Principal | ICD-10-CM

## 2019-03-01 DIAGNOSIS — Z7682 Awaiting organ transplant status: Principal | ICD-10-CM

## 2019-03-01 DIAGNOSIS — R9439 Abnormal result of other cardiovascular function study: Principal | ICD-10-CM

## 2019-03-01 DIAGNOSIS — N200 Calculus of kidney: Principal | ICD-10-CM

## 2019-03-01 DIAGNOSIS — Z01810 Encounter for preprocedural cardiovascular examination: Principal | ICD-10-CM

## 2019-03-01 DIAGNOSIS — Z01818 Encounter for other preprocedural examination: Principal | ICD-10-CM

## 2019-03-01 DIAGNOSIS — I120 Hypertensive chronic kidney disease with stage 5 chronic kidney disease or end stage renal disease: Principal | ICD-10-CM

## 2019-03-01 DIAGNOSIS — Z992 Dependence on renal dialysis: Principal | ICD-10-CM

## 2019-03-01 DIAGNOSIS — E1122 Type 2 diabetes mellitus with diabetic chronic kidney disease: Principal | ICD-10-CM

## 2019-03-01 DIAGNOSIS — M109 Gout, unspecified: Principal | ICD-10-CM

## 2019-03-01 DIAGNOSIS — I2510 Atherosclerotic heart disease of native coronary artery without angina pectoris: Principal | ICD-10-CM

## 2019-03-01 DIAGNOSIS — R946 Abnormal results of thyroid function studies: Principal | ICD-10-CM

## 2019-03-01 DIAGNOSIS — J90 Pleural effusion, not elsewhere classified: Principal | ICD-10-CM

## 2019-03-06 ENCOUNTER — Encounter
Admit: 2019-03-06 | Discharge: 2019-03-06 | Payer: MEDICARE | Attending: Transplant Surgery | Primary: Transplant Surgery

## 2019-03-06 DIAGNOSIS — Z01818 Encounter for other preprocedural examination: Principal | ICD-10-CM

## 2019-03-06 DIAGNOSIS — N186 End stage renal disease: Secondary | ICD-10-CM

## 2019-03-12 ENCOUNTER — Encounter: Admit: 2019-03-12 | Discharge: 2019-03-12 | Payer: MEDICARE | Attending: Nephrology | Primary: Nephrology

## 2019-03-12 DIAGNOSIS — Z01818 Encounter for other preprocedural examination: Principal | ICD-10-CM

## 2019-03-18 DIAGNOSIS — N186 End stage renal disease: Secondary | ICD-10-CM

## 2019-03-18 DIAGNOSIS — Z7682 Awaiting organ transplant status: Principal | ICD-10-CM

## 2019-03-18 DIAGNOSIS — Z01818 Encounter for other preprocedural examination: Secondary | ICD-10-CM

## 2019-04-02 ENCOUNTER — Ambulatory Visit: Admit: 2019-04-02 | Discharge: 2019-04-03 | Payer: MEDICARE

## 2019-04-02 ENCOUNTER — Encounter: Admit: 2019-04-02 | Discharge: 2019-04-02 | Payer: MEDICARE | Attending: Surgery | Primary: Surgery

## 2019-04-02 DIAGNOSIS — N186 End stage renal disease: Principal | ICD-10-CM

## 2019-04-02 DIAGNOSIS — Z01818 Encounter for other preprocedural examination: Principal | ICD-10-CM

## 2019-04-03 DIAGNOSIS — N186 End stage renal disease: Principal | ICD-10-CM

## 2019-04-09 ENCOUNTER — Encounter: Admit: 2019-04-09 | Discharge: 2019-04-09 | Payer: MEDICARE | Attending: Nephrology | Primary: Nephrology

## 2019-04-17 ENCOUNTER — Telehealth: Admit: 2019-04-17 | Discharge: 2019-04-18 | Payer: MEDICARE

## 2019-04-17 DIAGNOSIS — Z01818 Encounter for other preprocedural examination: Secondary | ICD-10-CM

## 2019-04-17 DIAGNOSIS — N186 End stage renal disease: Secondary | ICD-10-CM

## 2019-04-17 DIAGNOSIS — Z7682 Awaiting organ transplant status: Principal | ICD-10-CM

## 2019-04-17 DIAGNOSIS — E0590 Thyrotoxicosis, unspecified without thyrotoxic crisis or storm: Principal | ICD-10-CM

## 2019-04-17 DIAGNOSIS — E041 Nontoxic single thyroid nodule: Secondary | ICD-10-CM

## 2019-04-17 DIAGNOSIS — E049 Nontoxic goiter, unspecified: Secondary | ICD-10-CM

## 2019-04-19 ENCOUNTER — Ambulatory Visit: Admit: 2019-04-19 | Discharge: 2019-04-20 | Payer: MEDICARE

## 2019-04-19 DIAGNOSIS — E0590 Thyrotoxicosis, unspecified without thyrotoxic crisis or storm: Principal | ICD-10-CM

## 2019-04-26 ENCOUNTER — Ambulatory Visit: Admit: 2019-04-26 | Discharge: 2019-04-27 | Payer: MEDICARE | Attending: Family | Primary: Family

## 2019-04-26 DIAGNOSIS — I150 Renovascular hypertension: Principal | ICD-10-CM

## 2019-04-26 DIAGNOSIS — E041 Nontoxic single thyroid nodule: Secondary | ICD-10-CM

## 2019-04-26 DIAGNOSIS — N186 End stage renal disease: Secondary | ICD-10-CM

## 2019-05-04 ENCOUNTER — Other Ambulatory Visit
Admission: RE | Admit: 2019-05-04 | Discharge: 2019-05-04 | Disposition: A | Discharge status: Home / Self Care | Payer: Medicare Other | Source: Ambulatory Visit | Attending: Nephrology | Admitting: Nephrology

## 2019-05-04 DIAGNOSIS — N186 End stage renal disease: Secondary | ICD-10-CM | POA: Insufficient documentation

## 2019-05-04 LAB — POTASSIUM: Potassium: 5.8 mmol/L — ABNORMAL HIGH (ref 3.5–5.1)

## 2019-05-06 HISTORY — PX: KIDNEY TRANSPLANT: SHX239

## 2019-05-07 ENCOUNTER — Emergency Department: Payer: Medicare Other

## 2019-05-07 ENCOUNTER — Inpatient Hospital Stay
Admission: EM | Admit: 2019-05-07 | Discharge: 2019-05-09 | DRG: 308 | Disposition: A | Discharge status: Home / Self Care | Payer: Medicare Other | Source: Other Acute Inpatient Hospital | Attending: Internal Medicine | Admitting: Internal Medicine

## 2019-05-07 ENCOUNTER — Other Ambulatory Visit: Payer: Self-pay

## 2019-05-07 ENCOUNTER — Encounter: Payer: Self-pay | Admitting: Emergency Medicine

## 2019-05-07 DIAGNOSIS — I480 Paroxysmal atrial fibrillation: Principal | ICD-10-CM | POA: Diagnosis present

## 2019-05-07 DIAGNOSIS — F17210 Nicotine dependence, cigarettes, uncomplicated: Secondary | ICD-10-CM | POA: Diagnosis present

## 2019-05-07 DIAGNOSIS — Z833 Family history of diabetes mellitus: Secondary | ICD-10-CM

## 2019-05-07 DIAGNOSIS — E041 Nontoxic single thyroid nodule: Secondary | ICD-10-CM | POA: Diagnosis present

## 2019-05-07 DIAGNOSIS — R040 Epistaxis: Secondary | ICD-10-CM | POA: Diagnosis present

## 2019-05-07 DIAGNOSIS — M109 Gout, unspecified: Secondary | ICD-10-CM | POA: Diagnosis present

## 2019-05-07 DIAGNOSIS — Z8249 Family history of ischemic heart disease and other diseases of the circulatory system: Secondary | ICD-10-CM | POA: Diagnosis not present

## 2019-05-07 DIAGNOSIS — I10 Essential (primary) hypertension: Secondary | ICD-10-CM | POA: Diagnosis not present

## 2019-05-07 DIAGNOSIS — I150 Renovascular hypertension: Secondary | ICD-10-CM

## 2019-05-07 DIAGNOSIS — I4891 Unspecified atrial fibrillation: Secondary | ICD-10-CM | POA: Diagnosis present

## 2019-05-07 DIAGNOSIS — N186 End stage renal disease: Secondary | ICD-10-CM | POA: Diagnosis present

## 2019-05-07 DIAGNOSIS — E039 Hypothyroidism, unspecified: Secondary | ICD-10-CM | POA: Diagnosis present

## 2019-05-07 DIAGNOSIS — N2581 Secondary hyperparathyroidism of renal origin: Secondary | ICD-10-CM | POA: Diagnosis present

## 2019-05-07 DIAGNOSIS — Z841 Family history of disorders of kidney and ureter: Secondary | ICD-10-CM | POA: Diagnosis not present

## 2019-05-07 DIAGNOSIS — Z992 Dependence on renal dialysis: Secondary | ICD-10-CM

## 2019-05-07 DIAGNOSIS — Z1159 Encounter for screening for other viral diseases: Secondary | ICD-10-CM

## 2019-05-07 DIAGNOSIS — D631 Anemia in chronic kidney disease: Secondary | ICD-10-CM | POA: Diagnosis present

## 2019-05-07 DIAGNOSIS — J90 Pleural effusion, not elsewhere classified: Secondary | ICD-10-CM | POA: Diagnosis present

## 2019-05-07 DIAGNOSIS — I120 Hypertensive chronic kidney disease with stage 5 chronic kidney disease or end stage renal disease: Secondary | ICD-10-CM | POA: Diagnosis present

## 2019-05-07 LAB — COMPREHENSIVE METABOLIC PANEL
ALT: 28 U/L (ref 0–44)
AST: 16 U/L (ref 15–41)
Albumin: 4.3 g/dL (ref 3.5–5.0)
Alkaline Phosphatase: 92 U/L (ref 38–126)
Anion gap: 12 (ref 5–15)
BUN: 21 mg/dL — ABNORMAL HIGH (ref 6–20)
CO2: 27 mmol/L (ref 22–32)
Calcium: 8.7 mg/dL — ABNORMAL LOW (ref 8.9–10.3)
Chloride: 99 mmol/L (ref 98–111)
Creatinine, Ser: 4.25 mg/dL — ABNORMAL HIGH (ref 0.61–1.24)
GFR calc Af Amer: 17 mL/min — ABNORMAL LOW (ref >60)
GFR calc non Af Amer: 14 mL/min — ABNORMAL LOW (ref >60)
Glucose, Bld: 93 mg/dL (ref 70–99)
Potassium: 3.8 mmol/L (ref 3.5–5.1)
Sodium: 138 mmol/L (ref 135–145)
Total Bilirubin: 1.4 mg/dL — ABNORMAL HIGH (ref 0.3–1.2)
Total Protein: 6.5 g/dL (ref 6.5–8.1)

## 2019-05-07 LAB — CBC WITH DIFFERENTIAL/PLATELET
Abs Immature Granulocytes: 0.02 10*3/uL (ref 0.00–0.07)
Basophils Absolute: 0.1 10*3/uL (ref 0.0–0.1)
Basophils Relative: 1 %
Eosinophils Absolute: 0.7 10*3/uL — ABNORMAL HIGH (ref 0.0–0.5)
Eosinophils Relative: 11 %
HCT: 31 % — ABNORMAL LOW (ref 39.0–52.0)
Hemoglobin: 10.1 g/dL — ABNORMAL LOW (ref 13.0–17.0)
Immature Granulocytes: 0 %
Lymphocytes Relative: 8 %
Lymphs Abs: 0.5 10*3/uL — ABNORMAL LOW (ref 0.7–4.0)
MCH: 30.1 pg (ref 26.0–34.0)
MCHC: 32.6 g/dL (ref 30.0–36.0)
MCV: 92.3 fL (ref 80.0–100.0)
Monocytes Absolute: 0.7 10*3/uL (ref 0.1–1.0)
Monocytes Relative: 10 %
Neutro Abs: 4.8 10*3/uL (ref 1.7–7.7)
Neutrophils Relative %: 70 %
Platelets: 198 10*3/uL (ref 150–400)
RBC: 3.36 MIL/uL — ABNORMAL LOW (ref 4.22–5.81)
RDW: 16.1 % — ABNORMAL HIGH (ref 11.5–15.5)
WBC: 6.8 10*3/uL (ref 4.0–10.5)
nRBC: 0 % (ref 0.0–0.2)

## 2019-05-07 LAB — TROPONIN I: Troponin I: <0.03 ng/mL (ref <0.03)

## 2019-05-07 LAB — TSH: TSH: 0.487 u[IU]/mL (ref 0.350–4.500)

## 2019-05-07 LAB — SARS CORONAVIRUS 2 BY RT PCR (HOSPITAL ORDER, PERFORMED IN ~~LOC~~ HOSPITAL LAB): SARS Coronavirus 2: NEGATIVE

## 2019-05-07 LAB — PROTIME-INR
INR: 1 (ref 0.8–1.2)
Prothrombin Time: 13.3 seconds (ref 11.4–15.2)

## 2019-05-07 MED ORDER — ALLOPURINOL 100 MG PO TABS
100.0000 mg | ORAL_TABLET | Freq: Every evening | ORAL | Status: DC
  Administered 2019-05-07 – 2019-05-08 (×2): 100 mg via ORAL
  Filled 2019-05-07 (×3): qty 1

## 2019-05-07 MED ORDER — METOPROLOL TARTRATE 5 MG/5ML IV SOLN: 2.5000 mg | INTRAVENOUS | Status: DC | PRN

## 2019-05-07 MED ORDER — METOPROLOL SUCCINATE ER 100 MG PO TB24
100.0000 mg | ORAL_TABLET | Freq: Every day | ORAL | Status: DC
  Administered 2019-05-07 – 2019-05-09 (×3): 100 mg via ORAL
  Filled 2019-05-07 (×4): qty 1

## 2019-05-07 MED ORDER — RENA-VITE PO TABS
1.0000 | ORAL_TABLET | Freq: Every day | ORAL | Status: DC
  Administered 2019-05-08 – 2019-05-09 (×2): 1 via ORAL
  Filled 2019-05-07 (×2): qty 1

## 2019-05-07 MED ORDER — CINACALCET HCL 30 MG PO TABS
60.0000 mg | ORAL_TABLET | Freq: Every day | ORAL | Status: DC
  Administered 2019-05-07 – 2019-05-08 (×2): 60 mg via ORAL
  Filled 2019-05-07 (×3): qty 2

## 2019-05-07 MED ORDER — DILTIAZEM HCL 25 MG/5ML IV SOLN
10.0000 mg | Freq: Once | INTRAVENOUS | Status: AC
  Administered 2019-05-07: 10 mg via INTRAVENOUS
  Filled 2019-05-07: qty 5

## 2019-05-07 MED ORDER — KETOCONAZOLE 200 MG PO TABS
400.0000 mg | ORAL_TABLET | Freq: Every day | ORAL | Status: DC
  Filled 2019-05-07: qty 2

## 2019-05-07 MED ORDER — METOPROLOL TARTRATE 5 MG/5ML IV SOLN: 5.0000 mg | INTRAVENOUS | Status: DC | PRN

## 2019-05-07 MED ORDER — PANTOPRAZOLE SODIUM 40 MG PO TBEC
40.0000 mg | DELAYED_RELEASE_TABLET | Freq: Every day | ORAL | Status: DC
  Administered 2019-05-08 – 2019-05-09 (×2): 40 mg via ORAL
  Filled 2019-05-07 (×2): qty 1

## 2019-05-07 MED ORDER — CALCIUM ACETATE (PHOS BINDER) 667 MG PO CAPS
1134.0000 mg | ORAL_CAPSULE | Freq: Three times a day (TID) | ORAL | Status: DC
  Administered 2019-05-08 – 2019-05-09 (×4): 1334 mg via ORAL
  Filled 2019-05-07 (×6): qty 2

## 2019-05-07 MED ORDER — ONDANSETRON HCL 4 MG/2ML IJ SOLN: 4.0000 mg | Freq: Four times a day (QID) | INTRAMUSCULAR | Status: DC | PRN

## 2019-05-07 MED ORDER — CALCIUM ACETATE (PHOS BINDER) 667 MG PO CAPS
667.0000 mg | ORAL_CAPSULE | Freq: Every day | ORAL | Status: DC | PRN
  Administered 2019-05-07: 667 mg via ORAL
  Filled 2019-05-07 (×2): qty 1

## 2019-05-07 MED ORDER — DILTIAZEM HCL 100 MG IV SOLR
5.0000 mg/h | INTRAVENOUS | Status: DC
  Administered 2019-05-07: 5 mg/h via INTRAVENOUS
  Filled 2019-05-07: qty 100

## 2019-05-07 MED ORDER — ACETAMINOPHEN 325 MG PO TABS: 650.0000 mg | ORAL_TABLET | ORAL | Status: DC | PRN

## 2019-05-07 MED ORDER — DILTIAZEM LOAD VIA INFUSION
10.0000 mg | Freq: Once | INTRAVENOUS | Status: AC
  Administered 2019-05-07: 10 mg via INTRAVENOUS
  Filled 2019-05-07: qty 10

## 2019-05-07 NOTE — ED Notes (Signed)
Called pharmacy to check on meds.

## 2019-05-07 NOTE — ED Notes (Signed)
Pt given sandwich tray 

## 2019-05-07 NOTE — ED Notes (Signed)
Pt placed on hospital bed.  Given pillow. No other needs at this time.

## 2019-05-07 NOTE — ED Notes (Signed)
Pt converted to NSR. New EKG given to provider

## 2019-05-07 NOTE — ED Notes (Signed)
Sent message to pharmacy requesting all meds be verified and sent.

## 2019-05-07 NOTE — ED Notes (Signed)
ED TO INPATIENT HANDOFF REPORT  ED Nurse Name and Phone #: Terri Piedra [phone]  S Name/Age/Gender Matthew Mathis 57 y.o. male Room/Bed: ED13A/ED13A  Code Status   Code Status: Full Code  Home/SNF/Other Home Patient oriented to: self, place, time and situation Is this baseline? Yes   Triage Complete: Triage complete  Chief Complaint chest pain  Triage Note Was at dialysis, received full treatment. Started with fluttering in chest while there. New onset afib.  No pain. No SHOB.   Allergies No Known Allergies  Level of Care/Admitting Diagnosis ED Disposition    ED Disposition Condition [place]: Matthew Mathis [[id number]]  Level of Care: Telemetry [5]  Covid Evaluation: Person Under Investigation (PUI)  Isolation Risk Level: Low Risk/Droplet (Less than 4L [place] supplementation)  Diagnosis: Atrial fibrillation with rapid ventricular response [facility]) [[id number]]  Admitting Physician: Eula Flax  Attending Physician: Hyman Bible DODD [[id number]]  Estimated length of stay: past midnight tomorrow  Certification:: I certify this patient will need inpatient services for at least 2 midnights  PT Class (Do Not Modify): Inpatient [101]  PT Acc Code (Do Not Modify): Private [1]       B Medical/Surgery History Past Medical History:  Diagnosis Date  . Chronic renal failure    hx   . Gout    hx  . HTN (hypertension)    Past Surgical History:  Procedure Laterality Date  . CAPD INSERTION Right 05/24/2017   Procedure: LAPAROSCOPIC INSERTION CONTINUOUS AMBULATORY PERITONEAL DIALYSIS  (CAPD) CATHETER;  Surgeon: Katha Cabal, MD;  Location: [facility] ORS;  Service: Vascular;  Laterality: Right;  . DIALYSIS/PERMA CATHETER INSERTION N/A 05/07/2018   Procedure: DIALYSIS/PERMA CATHETER INSERTION;  Surgeon: Algernon Huxley, MD;  Location: [place] CV LAB;  Service: Cardiovascular;  Laterality: N/A;  . DIALYSIS/PERMA CATHETER REMOVAL N/A  06/27/2017   Procedure: Dialysis/Perma Catheter Removal;  Surgeon: Katha Cabal, MD;  Location: [place] CV LAB;  Service: Cardiovascular;  Laterality: N/A;  . DIALYSIS/PERMA CATHETER REMOVAL N/A 07/18/2018   Procedure: DIALYSIS/PERMA CATHETER REMOVAL;  Surgeon: Katha Cabal, MD;  Location: [place] CV LAB;  Service: Cardiovascular;  Laterality: N/A;  . INSERTION OF DIALYSIS CATHETER Right 05/24/2017   Procedure: INSERTION OF DIALYSIS CATHETER ( TUNNELED CATH );  Surgeon: Katha Cabal, MD;  Location: [facility] ORS;  Service: Vascular;  Laterality: Right;  . PERITONEAL CATHETER INSERTION    . REMOVAL OF A DIALYSIS CATHETER Left 05/24/2017   Procedure: REMOVAL OF A DIALYSIS CATHETER ( PD CATH );  Surgeon: Katha Cabal, MD;  Location: [facility] ORS;  Service: Vascular;  Laterality: Left;  . RENAL BIOPSY     non specific      A IV Location/Drains/Wounds Patient Lines/Drains/Airways Status   Active Line/Drains/Airways    Name:   Placement date:   Placement time:   Site:   Days:   Peripheral IV 05/07/19 Left Wrist   05/07/19    -    Wrist   less than 1   Peripheral IV 05/07/19 Right Antecubital   05/07/19    1449    Antecubital   less than 1          Intake/Output Last 24 hours No intake or output data in the 24 hours ending 05/07/19 2153  Labs/Imaging Results for orders placed or performed during the hospital encounter of 05/07/19 (from the past 48 hour(s))  Comprehensive metabolic panel     Status: Abnormal  Collection Time: 05/07/19  2:01 PM  Result Value Ref Range   Sodium 138 135 - 145 mmol/L   Potassium 3.8 3.5 - 5.1 mmol/L   Chloride 99 98 - 111 mmol/L   CO2 27 22 - 32 mmol/L   Glucose, Bld 93 70 - 99 mg/dL   BUN 21 (H) 6 - 20 mg/dL   Creatinine, Ser 4.25 (H) 0.61 - 1.24 mg/dL   Calcium 8.7 (L) 8.9 - 10.3 mg/dL   Total Protein 6.5 6.5 - 8.1 g/dL   Albumin 4.3 3.5 - 5.0 g/dL   AST 16 15 - 41 U/L   ALT 28 0 - 44 U/L   Alkaline Phosphatase 92 38 - 126  U/L   Total Bilirubin 1.4 (H) 0.3 - 1.2 mg/dL   GFR calc non Af Amer 14 (L) >60 mL/min   GFR calc Af Amer 17 (L) >60 mL/min   Anion gap 12 5 - 15    Comment: Performed at [facility], [address] [place], [place] [address]  CBC with Differential     Status: Abnormal   Collection Time: 05/07/19  2:01 PM  Result Value Ref Range   WBC 6.8 4.0 - 10.5 K/uL   RBC 3.36 (L) 4.22 - 5.81 MIL/uL   Hemoglobin 10.1 (L) 13.0 - 17.0 g/dL   HCT 31.0 (L) 39.0 - 52.0 %   MCV 92.3 80.0 - 100.0 fL   MCH 30.1 26.0 - 34.0 pg   MCHC 32.6 30.0 - 36.0 g/dL   RDW 16.1 (H) 11.5 - 15.5 %   Platelets 198 150 - 400 K/uL   nRBC 0.0 0.0 - 0.2 %   Neutrophils Relative % 70 %   Neutro Abs 4.8 1.7 - 7.7 K/uL   Lymphocytes Relative 8 %   Lymphs Abs 0.5 (L) 0.7 - 4.0 K/uL   Monocytes Relative 10 %   Monocytes Absolute 0.7 0.1 - 1.0 K/uL   Eosinophils Relative 11 %   Eosinophils Absolute 0.7 (H) 0.0 - 0.5 K/uL   Basophils Relative 1 %   Basophils Absolute 0.1 0.0 - 0.1 K/uL   Immature Granulocytes 0 %   Abs Immature Granulocytes 0.02 0.00 - 0.07 K/uL    Comment: Performed at [facility], [address] [place], [place] [address]  Troponin I - Once     Status: None   Collection Time: 05/07/19  2:01 PM  Result Value Ref Range   Troponin I <0.03 <0.03 ng/mL    Comment: Performed at [facility], [address] [place], [place] [address]  Protime-INR     Status: None   Collection Time: 05/07/19  2:01 PM  Result Value Ref Range   Prothrombin Time 13.3 11.4 - 15.2 seconds   INR 1.0 0.8 - 1.2    Comment: (NOTE) INR goal varies based on device and disease states. Performed at [facility], [address] [place], [place] [address]   TSH     Status: None   Collection Time: 05/07/19  2:01 PM  Result Value Ref Range   TSH 0.487 0.350 - 4.500 uIU/mL    Comment: Performed by a 3rd Generation assay with a functional sensitivity of <=0.01 uIU/mL. Performed at [facility], [address] [place], [place] [address]   SARS Coronavirus 2 (CEPHEID - Performed in [facility] hospital lab), [facility] Order     Status: None   Collection Time: 05/07/19  3:16 PM  Result Value Ref Range  SARS Coronavirus 2 NEGATIVE NEGATIVE    Comment: (NOTE) If result is NEGATIVE SARS-CoV-2 target nucleic acids are NOT DETECTED. The SARS-CoV-2 RNA is generally detectable in upper and lower  respiratory specimens during the acute phase of infection. The lowest  concentration of SARS-CoV-2 viral copies this assay can detect is 250  copies / mL. A negative result does not preclude SARS-CoV-2 infection  and should not be used as the sole basis for treatment or other  patient management decisions.  A negative result may occur with  improper specimen collection / handling, submission of specimen other  than nasopharyngeal swab, presence of viral mutation(s) within the  areas targeted by this assay, and inadequate number of viral copies  (<250 copies / mL). A negative result must be combined with clinical  observations, patient history, and epidemiological information. If result is POSITIVE SARS-CoV-2 target nucleic acids are DETECTED. The SARS-CoV-2 RNA is generally detectable in upper and lower  respiratory specimens dur ing the acute phase of infection.  Positive  results are indicative of active infection with SARS-CoV-2.  Clinical  correlation with patient history and other diagnostic information is  necessary to determine patient infection status.  Positive results do  not rule out bacterial infection or co-infection with other viruses. If result is PRESUMPTIVE POSTIVE SARS-CoV-2 nucleic acids MAY BE PRESENT.   A presumptive positive result was obtained on the submitted specimen  and confirmed on repeat testing.  While 2019 novel coronavirus  (SARS-CoV-2) nucleic acids may be present in the submitted sample  additional confirmatory testing may be necessary for  epidemiological  and / or clinical management purposes  to differentiate between  SARS-CoV-2 and other Sarbecovirus currently known to infect humans.  If clinically indicated additional testing with an alternate test  methodology [phone]) is advised. The SARS-CoV-2 RNA is generally  detectable in upper and lower respiratory sp ecimens during the acute  phase of infection. The expected result is Negative. Fact Sheet for Patients:  StrictlyIdeas.no Fact Sheet for Healthcare Providers: BankingDealers.co.za This test is not yet approved or cleared by the [place] FDA and has been authorized for detection and/or diagnosis of SARS-CoV-2 by FDA under an Emergency Use Authorization (EUA).  This EUA will remain in effect (meaning this test can be used) for the duration of the COVID-19 declaration under Section 564(b)(1) of the Act, 21 U.S.C. section 360bbb-3(b)(1), unless the authorization is terminated or revoked sooner. Performed at [facility], [address] [place], [place] [address]    Dg Chest 2 View  Result Date: 05/07/2019 CLINICAL DATA:  Atrial fibrillation EXAM: CHEST - 2 VIEW COMPARISON:  10/14/2018 FINDINGS: Cardiac shadow is stable. Aortic calcifications are again seen. Right jugular dialysis catheter is noted in satisfactory position. Small bilateral pleural effusions are noted. No acute bony abnormality is noted. IMPRESSION: Small bilateral pleural effusions. No other focal abnormality is noted. Electronically Signed   By: Inez Catalina M.D.   On: 05/07/2019 12:52    Pending Labs Unresulted Labs (From admission, onward)    Start     Ordered   05/08/19 0500  CBC with Differential/Platelet  Tomorrow morning,   STAT     05/07/19 2029   05/08/19 0500  Comprehensive metabolic panel  Tomorrow morning,   STAT     05/07/19 2029   05/07/19 2116  Hemoglobin A1c  Add-on,   AD     05/07/19 2115   05/07/19 1608  Urinalysis,  Complete w Microscopic  Once,   STAT  05/07/19 1607   05/07/19 1608  Urine Drug Screen, Qualitative ([facility] only)  Once,   STAT     05/07/19 1607          Vitals/Pain Today's Vitals   05/07/19 1830 05/07/19 1900 05/07/19 1930 05/07/19 2104  BP: 129/78 136/72 137/75 125/72  Pulse:   76   Resp: 19 (!) 24 20 18   Temp:      TempSrc:      SpO2:   97%   Weight:      Height:      PainSc:        Isolation Precautions No active isolations  Medications Medications  allopurinol (ZYLOPRIM) tablet 100 mg (100 mg Oral Given 05/07/19 1955)  ketoconazole (NIZORAL) tablet 400 mg (has no administration in time range)  cinacalcet (SENSIPAR) tablet 60 mg (60 mg Oral Given 05/07/19 1920)  calcium acetate (PHOSLO) capsule 1,334 mg (has no administration in time range)  pantoprazole (PROTONIX) EC tablet 40 mg (has no administration in time range)  multivitamin (RENA-VIT) tablet 1 tablet (has no administration in time range)  acetaminophen (TYLENOL) tablet 650 mg (has no administration in time range)  ondansetron (ZOFRAN) injection 4 mg (has no administration in time range)  metoprolol succinate (TOPROL-XL) 24 hr tablet 100 mg (has no administration in time range)  metoprolol tartrate (LOPRESSOR) injection 5 mg (has no administration in time range)  calcium acetate (PHOSLO) capsule 667 mg (667 mg Oral Given 05/07/19 1954)  diltiazem (CARDIZEM) injection 10 mg (10 mg Intravenous Given 05/07/19 1402)  diltiazem (CARDIZEM) 1 mg/mL load via infusion 10 mg (10 mg Intravenous Bolus from Bag 05/07/19 1441)    Mobility walks Low fall risk   Focused Assessments Renal Assessment Handoff:  Hemodialysis Schedule: Hemodialysis Schedule: Tuesday/Thursday/Saturday Last Hemodialysis date and time: 05/07/2019   Restricted appendage: right  arm     R Recommendations: See Admitting Provider Note  Report given to:   Additional Notes:

## 2019-05-07 NOTE — ED Notes (Signed)
Pt given gingerale and pen/paper

## 2019-05-07 NOTE — ED Notes (Signed)
Patient transported to X-ray 

## 2019-05-07 NOTE — ED Triage Notes (Signed)
Was at dialysis, received full treatment. Started with fluttering in chest while there. New onset afib.  No pain. No SHOB.

## 2019-05-07 NOTE — ED Notes (Signed)
Pt speaking with this RN in NAD, pt reports fluttering has subsided, no c/o pain at this time

## 2019-05-07 NOTE — ED Notes (Signed)
covid swab walked down to the lab

## 2019-05-07 NOTE — ED Notes (Signed)
Pt given toothbrush and toothpaste at this time

## 2019-05-07 NOTE — ED Notes (Signed)
Called to check on pt med verification

## 2019-05-07 NOTE — H&P (Signed)
[facility] [place] NAME: Matthew Mathis    MR#:  [id number]  DATE OF BIRTH:  12-18-1960  DATE OF ADMISSION:  05/07/2019  PRIMARY CARE PHYSICIAN: Alexis Goodell, MD   REQUESTING/REFERRING PHYSICIAN: Triplett,Car, FNP  CHIEF COMPLAINT:   Chief Complaint  Patient presents with   new onset afib    HISTORY OF PRESENT ILLNESS:  Matthew Mathis  is a 58 y.o. male with a known history of hypertension, secondary hyperparathyroidism, anemia and chronic kidney disease,ESRD on HD Tuesday, Thursday, Saturday, presenting from dialysis with complaints of irregular heart rate.  Patient reports onset of symptoms while in dialysis today.  He describes symptoms of racing and uncomfortable feeling in his chest.  Denies weakness, fatigue, dizziness, shortness of breath chest pain, nausea or vomiting, or diaphoresis.  Patient was able to complete dialysis without any incident and was brought in ED for further evaluation.  On arrival to the ED, he was afebrile with blood pressure of 142/86 mmHg and heart rate 108.  Initial EKG showed atrial fibrillation with RVR with heart rate of 115. Diltiazem 0.25mg /kg (15mg ) IV x1 then maintain 5-15mg /hr drip was started with optimal rate control noted.  Labs revealed decreased kidney functions at baseline, CBC showed anemia at baseline, troponin <0.03, TSH 0.487. Patient tested negative for SARS-CoV-2 virus that causes COVID-19.  Patient is being admitted to hospitalist service for further management.  PAST MEDICAL HISTORY:   Past Medical History:  Diagnosis Date   Chronic renal failure    hx    Gout    hx   HTN (hypertension)     PAST SURGICAL HISTORY:   Past Surgical History:  Procedure Laterality Date   CAPD INSERTION Right 05/24/2017   Procedure: LAPAROSCOPIC INSERTION CONTINUOUS AMBULATORY PERITONEAL DIALYSIS  (CAPD) CATHETER;  Surgeon: Katha Cabal, MD;  Location: [facility] ORS;  Service: Vascular;   Laterality: Right;   DIALYSIS/PERMA CATHETER INSERTION N/A 05/07/2018   Procedure: DIALYSIS/PERMA CATHETER INSERTION;  Surgeon: Algernon Huxley, MD;  Location: [place] CV LAB;  Service: Cardiovascular;  Laterality: N/A;   DIALYSIS/PERMA CATHETER REMOVAL N/A 06/27/2017   Procedure: Dialysis/Perma Catheter Removal;  Surgeon: Katha Cabal, MD;  Location: [place] CV LAB;  Service: Cardiovascular;  Laterality: N/A;   DIALYSIS/PERMA CATHETER REMOVAL N/A 07/18/2018   Procedure: DIALYSIS/PERMA CATHETER REMOVAL;  Surgeon: Katha Cabal, MD;  Location: [place] CV LAB;  Service: Cardiovascular;  Laterality: N/A;   INSERTION OF DIALYSIS CATHETER Right 05/24/2017   Procedure: INSERTION OF DIALYSIS CATHETER ( TUNNELED CATH );  Surgeon: Katha Cabal, MD;  Location: [facility] ORS;  Service: Vascular;  Laterality: Right;   PERITONEAL CATHETER INSERTION     REMOVAL OF A DIALYSIS CATHETER Left 05/24/2017   Procedure: REMOVAL OF A DIALYSIS CATHETER ( PD CATH );  Surgeon: Katha Cabal, MD;  Location: [facility] ORS;  Service: Vascular;  Laterality: Left;   RENAL BIOPSY     non specific     SOCIAL HISTORY:   Social History   Tobacco Use   Smoking status: Former Smoker    Packs/day: 0.50    Types: Cigarettes    Last attempt to quit: 12/04/2010    Years since quitting: 8.4   Smokeless tobacco: Never Used   Tobacco comment: used to smoke 2-3 ppd, now smokes 3 cigarettes daily   Substance Use Topics   Alcohol use: No    FAMILY HISTORY:   Family History  Problem Relation Age of  Onset   Diabetes Father    CAD Father    Hypertension Mother        brother, sister    Kidney disease Mother     DRUG ALLERGIES:  No Known Allergies  REVIEW OF SYSTEMS:   Review of Systems  Constitutional: Negative for chills, fever, malaise/fatigue and weight loss.  HENT: Negative for congestion, hearing loss and sore throat.   Eyes: Negative for blurred vision and double vision.    Respiratory: Negative for cough, shortness of breath and wheezing.   Cardiovascular: Positive for palpitations. Negative for chest pain, orthopnea and leg swelling.  Gastrointestinal: Negative for abdominal pain, diarrhea, nausea and vomiting.  Genitourinary: Negative for dysuria and urgency.       Oliguria  Musculoskeletal: Negative for myalgias.  Skin: Negative for rash.  Neurological: Negative for dizziness, sensory change, speech change, focal weakness and headaches.  Psychiatric/Behavioral: Negative for depression.   MEDICATIONS AT HOME:   Prior to Admission medications   Medication Sig Start Date End Date Taking? Authorizing Provider  allopurinol (ZYLOPRIM) 100 MG tablet Take 100 mg by mouth every evening.  07/25/16  Yes [provider]  amLODipine (NORVASC) 5 MG tablet Take 5 mg by mouth daily.   Yes [provider]  calcium acetate (PHOSLO) 667 MG capsule Take 667-1,334 mg by mouth See admin instructions. Take 3 capsules (1334mg ) by mouth three times a day with meals and take 1 capsule (667mg ) by mouth with snacks - total 8 capsules daily   Yes [provider]  cinacalcet (SENSIPAR) 60 MG tablet Take 60 mg by mouth daily.    Yes [provider]  furosemide (LASIX) 80 MG tablet Take 80 mg by mouth See admin instructions. Take 1 tablet (80mg ) by mouth daily every Monday, Wednesday, Friday and Sunday   Yes [provider]  hydrALAZINE (APRESOLINE) 25 MG tablet Take 25 mg by mouth 3 (three) times daily.   Yes [provider]  ketoconazole (NIZORAL) 200 MG tablet Take 400 mg by mouth daily.   Yes [provider]  losartan (COZAAR) 100 MG tablet Take 100 mg by mouth daily.   Yes [provider]  metoprolol succinate (TOPROL-XL) 50 MG 24 hr tablet Take 1 tablet (50 mg total) by mouth daily. Take with or immediately following a meal. 05/10/18  Yes Gouru, Aruna, MD  Multiple Vitamins-Minerals (DIALYVITE 800/ULTRA D) TABS  Take 1 tablet by mouth daily. 05/22/17  Yes [provider]  omeprazole (PRILOSEC) 20 MG capsule Take 20 mg by mouth daily.   Yes [provider]  predniSONE (DELTASONE) 20 MG tablet Take 20 mg by mouth daily as needed.   Yes [provider]  Skin Protectants, Misc. (EUCERIN) cream Apply 1 application topically as needed for dry skin.   Yes [provider]  pregabalin (LYRICA) 25 MG capsule Take 1 capsule (25 mg total) by mouth daily. Patient not taking: Reported on 05/07/2019 11/16/18 11/16/19  Epifanio Lesches, MD      VITAL SIGNS:  Blood pressure 124/87, pulse 74, temperature 98.7 F (37.1 C), temperature source Oral, resp. rate 17, height 5\' 6"  (1.676 m), weight 67 kg, SpO2 99 %.  PHYSICAL EXAMINATION:   Physical Exam  GENERAL:  58 y.o.-year-old patient lying in the bed with no acute distress.  EYES: Pupils equal, round, reactive to light and accommodation. No scleral icterus. Extraocular muscles intact.  HEENT: Head atraumatic, normocephalic. Oropharynx and nasopharynx clear.  NECK:  Supple, no jugular venous distention. No  thyroid enlargement, no tenderness.  LUNGS: Normal breath sounds bilaterally, no wheezing, rales,rhonchi or crepitation. No use of accessory muscles of respiration.  CARDIOVASCULAR: S1, S2 normal. No murmurs, rubs, or gallops. Irregular heart rythm ABDOMEN: Soft, nontender, nondistended. Bowel sounds present. No organomegaly or mass.  EXTREMITIES: No pedal edema, cyanosis, or clubbing.  NEUROLOGIC: Mental Status:Alert, oriented, thought content appropriate.  Speech fluent without evidence of aphasia.  Able to follow 3 step commands without difficulty. Attention span and concentration seemed appropriate  Cranial Nerves: II: Discs flat bilaterally; Visual fields grossly normal, pupils equal, round, reactive to light and accommodation III,IV, VI: ptosis not present, extra-ocular motions intact bilaterally V,VII: smile symmetric,  facial light touch sensation intact VIII: hearing normal bilaterally IX,X: gag reflex present XI: bilateral shoulder shrug XII: midline tongue extension Muscle strength 5/5 in all extremities.  Tone and bulk:normal tone throughout; no atrophy noted Sensory: Pinprick and light touch intact bilaterally Deep Tendon Reflexes: 2+ and symmetric throughout Cerebellar:Absent ataxia Gait: not tested due to safety concerns PSYCHIATRIC: The patient is alert and oriented x 3.  SKIN: No obvious rash, lesion, or ulcer.   DATA REVIEWED:  LABORATORY PANEL:   CBC Recent Labs  Lab 05/07/19 1401  WBC 6.8  HGB 10.1*  HCT 31.0*  PLT 198   ------------------------------------------------------------------------------------------------------------------  Chemistries  Recent Labs  Lab 05/07/19 1401  NA 138  K 3.8  CL 99  CO2 27  GLUCOSE 93  BUN 21*  CREATININE 4.25*  CALCIUM 8.7*  AST 16  ALT 28  ALKPHOS 92  BILITOT 1.4*   ------------------------------------------------------------------------------------------------------------------  Cardiac Enzymes Recent Labs  Lab 05/07/19 1401  TROPONINI <0.03   ------------------------------------------------------------------------------------------------------------------  RADIOLOGY:  Dg Chest 2 View  Result Date: 05/07/2019 CLINICAL DATA:  Atrial fibrillation EXAM: CHEST - 2 VIEW COMPARISON:  10/14/2018 FINDINGS: Cardiac shadow is stable. Aortic calcifications are again seen. Right jugular dialysis catheter is noted in satisfactory position. Small bilateral pleural effusions are noted. No acute bony abnormality is noted. IMPRESSION: Small bilateral pleural effusions. No other focal abnormality is noted. Electronically Signed   By: Inez Catalina M.D.   On: 05/07/2019 12:52    EKG:  EKG: unchanged from previous tracings, atrial fibrillation, rate 115.  IMPRESSION AND PLAN:   58 y.o. male with a known history of hypertension, secondary  hyperparathyroidism, anemia and chronic kidney disease,ESRD on HD Tuesday, Thursday, Saturday, presenting from dialysis with complaints of irregular heart rate.  1. AFib+RVR, - Stable no evidence of hypotensive, MI, angina, acute heart failure - Admit to telemetry unit - Chest x-ray shows small bilateral pleural effusion.  No other focal abnormality noted - TEEcho to exclude LAA thombus and embolus risk first or any structural abnormalities - EKG+Telemetry - Troponins negative - TSH normal - UTox pending - Patient treated with diltiazem 0.25mg /kg (15mg ) IV x1 then maintain 5-15mg /hr drip in the ED with optimal rate control. - Stopping diltiazem and starting metoprolol 100 mg XL/day.  He takes 50 mg XL at home - Cardiology Consult  2. HTN - Currently stable will continue to monitor in the setting of afib with RVR + Goal BP <140/90 - Beta-blocker: Continue metoprolol as above - Holding hydralazine, amlodipine and losartan in the setting of A. fib with RVR.  Need to restart this once hemodynamically stable.  3. ESRD - On HD Tuesday, Thursday and Saturday.  Last HD 05/07/2019  4. Anemia of CKD:  Hgb stable at 10.1.  Receiving Epogen with  treatment.   5.Thromboembolism Risk Management -  Heparin subq - CHADS2 = 1    All the records are reviewed and case discussed with ED provider. Management plans discussed with the patient, family and they are in agreement.  CODE STATUS: FULL  TOTAL TIME TAKING CARE OF THIS PATIENT: 40 minutes.    on 05/07/2019 at 3:15 PM  This patient was staffed with Dr. Valetta Fuller, [facility] who personally evaluated patient, reviewed documentation and agreed with assessment and plan of care as above.  Rufina Falco, DNP, FNP-BC [facility] Nurse Practitioner Between 7am to 6pm - Pager [phone]  After 6pm go to [email or web address] - password [email or web address]  Office  [phone]  CC: Primary care physician; Alexis Goodell, MD

## 2019-05-07 NOTE — ED Provider Notes (Signed)
[facility] Emergency Department Provider Note  ____________________________________________   None    (approximate)  I have reviewed the triage vital signs and the nursing notes.   HISTORY  Chief Complaint new onset afib   HPI Matthew Mathis is a 58 y.o. male  With a history of ESRD on dialysis for the past 8 years, who presents to the emergency department for treatment and evaluation of irregular heart rate. He states that he began to feel his heart "pumping faster" while in dialysis today. He has not experienced any chest pain or shortness of breath. He was able to complete his full dialysis treatment today.   Past Medical History:  Diagnosis Date  . Chronic renal failure    hx   . Gout    hx  . HTN (hypertension)     Patient Active Problem List   Diagnosis Date Noted  . Atrial fibrillation with rapid ventricular response ([place]) 05/07/2019  . Swelling of arm 12/21/2018  . SBO (small bowel obstruction) ([place]) 11/12/2018  . Encephalopathy acute 10/15/2018  . Small bowel obstruction ([place]) 05/03/2018  . End stage renal disease ([place]) 05/08/2017  . Complication of renal dialysis device 05/08/2017  . HYPERTENSION, BENIGN 10/01/2010  . CHEST PAIN UNSPECIFIED 10/01/2010    Past Surgical History:  Procedure Laterality Date  . CAPD INSERTION Right 05/24/2017   Procedure: LAPAROSCOPIC INSERTION CONTINUOUS AMBULATORY PERITONEAL DIALYSIS  (CAPD) CATHETER;  Surgeon: Katha Cabal, MD;  Location: [facility] ORS;  Service: Vascular;  Laterality: Right;  . DIALYSIS/PERMA CATHETER INSERTION N/A 05/07/2018   Procedure: DIALYSIS/PERMA CATHETER INSERTION;  Surgeon: Algernon Huxley, MD;  Location: [place] [facility];  Service: Cardiovascular;  Laterality: N/A;  . DIALYSIS/PERMA CATHETER REMOVAL N/A 06/27/2017   Procedure: Dialysis/Perma Catheter Removal;  Surgeon: Katha Cabal, MD;  Location: [place] [facility];  Service: Cardiovascular;  Laterality: N/A;  .  DIALYSIS/PERMA CATHETER REMOVAL N/A 07/18/2018   Procedure: DIALYSIS/PERMA CATHETER REMOVAL;  Surgeon: Katha Cabal, MD;  Location: [place] [facility];  Service: Cardiovascular;  Laterality: N/A;  . INSERTION OF DIALYSIS CATHETER Right 05/24/2017   Procedure: INSERTION OF DIALYSIS CATHETER ( TUNNELED CATH );  Surgeon: Katha Cabal, MD;  Location: [facility] OR[facility];  Service: Vascular;  Laterality: Right;  . PERITONEAL CATHETER INSERTION    . REMOVAL OF A DIALYSIS CATHETER Left 05/24/2017   Procedure: REMOVAL OF A DIALYSIS CATHETER ( PD CATH );  Surgeon: Katha Cabal, MD;  Location: [facility] OR[facility];  Service: Vascular;  Laterality: Left;  . RENAL BIOPSY     non specific     Prior to Admission medications   Medication Sig Start Date End Date Taking? Authorizing Provider  allopurinol (ZYLOPRIM) 100 MG tablet Take 100 mg by mouth every evening.  07/25/16  Yes [provider]  amLODipine (NORVASC) 5 MG tablet Take 5 mg by mouth daily.   Yes [provider]  calcium acetate (PHOSLO) 667 MG capsule Take 667-1,334 mg by mouth See admin instructions. Take 3 capsules (1334mg ) by mouth three times a day with meals and take 1 capsule (667mg ) by mouth with snacks - total 8 capsules daily   Yes [provider]  cinacalcet (SENSIPAR) 60 MG tablet Take 60 mg by mouth daily.    Yes [provider]  furosemide (LASIX) 80 MG tablet Take 80 mg by mouth See admin instructions. Take 1 tablet (80mg ) by mouth daily every Monday, Wednesday, Friday and Sunday   Yes [provider]  hydrALAZINE (APRESOLINE) 25 MG tablet Take 25 mg by mouth 3 (three) times daily.   Yes [provider]  ketoconazole (NIZORAL) 200 MG tablet Take 400 mg by mouth daily.   Yes [provider]  losartan (COZAAR) 100 MG tablet Take 100 mg by mouth daily.   Yes [provider]  metoprolol succinate (TOPROL-XL) 50 MG 24 hr tablet Take 1 tablet (50 mg total) by mouth daily. Take  with or immediately following a meal. 05/10/18  Yes Gouru, Aruna, MD  Multiple Vitamins-Minerals (DIALYVITE 800/ULTRA D) TABS Take 1 tablet by mouth daily. 05/22/17  Yes [provider]  omeprazole (PRILOSEC) 20 MG capsule Take 20 mg by mouth daily.   Yes [provider]  predniSONE (DELTASONE) 20 MG tablet Take 20 mg by mouth daily as needed.   Yes [provider]  Skin Protectants, Misc. (EUCERIN) cream Apply 1 application topically as needed for dry skin.   Yes [provider]  pregabalin (LYRICA) 25 MG capsule Take 1 capsule (25 mg total) by mouth daily. Patient not taking: Reported on 05/07/2019 11/16/18 11/16/19  Epifanio Lesches, MD    Allergies Patient has no known allergies.  Family History  Problem Relation Age of Onset  . Diabetes Father   . CAD Father   . Hypertension Mother        brother, sister   . Kidney disease Mother     Social History Social History   Tobacco Use  . Smoking status: Former Smoker    Packs/day: 0.50    Types: Cigarettes    Last attempt to quit: 12/04/2010    Years since quitting: 8.4  . Smokeless tobacco: Never Used  . Tobacco comment: used to smoke 2-3 ppd, now smokes 3 cigarettes daily   Substance Use Topics  . Alcohol use: No  . Drug use: No    Review of Systems  Constitutional: No fever/chills. Eyes: No visual changes. ENT: No sore throat. Cardiovascular: Negative for chest pain. negative for pleuritic pain. Positive for palpitations. negative for leg pain. Respiratory: Negative shortness of breath. Gastrointestinal: Negative for abdominal pain. no nausea, no vomiting.  No diarrhea.  No constipation. Genitourinary: Negative for dysuria. Musculoskeletal: Negative for back pain.  Skin: Negative for rash, lesion, wound. Neurological: Negative for headaches, focal weakness or numbness. ____________________________________________   PHYSICAL EXAM:  VITAL SIGNS: ED Triage Vitals  Enc Vitals  Group     BP 05/07/19 1139 (!) 142/86     Pulse Rate 05/07/19 1139 (!) 108     Resp 05/07/19 1139 19     Temp 05/07/19 1139 98.7 F (37.1 C)     Temp Source 05/07/19 1139 Oral     SpO2 05/07/19 1136 99 %     Weight 05/07/19 1138 147 lb 11.3 oz (67 kg)     Height 05/07/19 1138 5\' 6"  (1.676 m)     Head Circumference --      Peak Flow --      Pain Score 05/07/19 1137 0     Pain Loc --      Pain Edu? --      Excl. in [place]? --     Constitutional: Alert and oriented. Well appearing and in no acute distress. Normal mental status. Eyes: Conjunctivae are normal. PERRL. Head: Atraumatic. Nose: No congestion/rhinnorhea. Mouth/Throat: Mucous membranes are moist.  Oropharynx non-erythematous. Tongue normal in size and color. Neck: No stridor. No carotid bruit appreciated on exam. Hematological/Lymphatic/Immunilogical: No cervical lymphadenopathy. Cardiovascular: Normal rate, regular  rhythm. Grossly normal heart sounds.  Good peripheral circulation. Respiratory: Normal respiratory effort.  No retractions. Lungs CTAB. Gastrointestinal: Soft and nontender. No distention. No abdominal bruits. No CVA tenderness. Genitourinary: Exam deferred. Musculoskeletal: No lower extremity tenderness. No edema of extremities. Neurologic:  Normal speech and language. No gross focal neurologic deficits are appreciated. Skin:  Skin is warm, dry and intact. No rash noted. Psychiatric: Mood and affect are normal. Speech and behavior are normal.  ____________________________________________   LABS (all labs ordered are listed, but only abnormal results are displayed)  Labs Reviewed  COMPREHENSIVE METABOLIC PANEL - Abnormal; Notable for the following components:      Result Value   BUN 21 (*)    Creatinine, Ser 4.25 (*)    Calcium 8.7 (*)    Total Bilirubin 1.4 (*)    GFR calc non Af Amer 14 (*)    GFR calc Af Amer 17 (*)    All other components within normal limits  CBC WITH DIFFERENTIAL/PLATELET -  Abnormal; Notable for the following components:   RBC 3.36 (*)    Hemoglobin 10.1 (*)    HCT 31.0 (*)    RDW 16.1 (*)    Lymphs Abs 0.5 (*)    Eosinophils Absolute 0.7 (*)    All other components within normal limits  SARS CORONAVIRUS 2 ([place])  TROPONIN I  PROTIME-INR  TSH  URINALYSIS, COMPLETE (UACMP) WITH MICROSCOPIC  URINE DRUG SCREEN, QUALITATIVE ([facility] ONLY)   ____________________________________________  EKG  ED ECG REPORT I, Kaimana Neuzil, FNP-BC personally viewed and interpreted this ECG.   Date: 05/07/2019  EKG Time: 11:46 AM  Rate: 110  Rhythm: atrial fibrillation, rate 110  Axis: Normal  Intervals:none  ST&T Change: No ST elevation  ____________________________________________  RADIOLOGY  ED MD interpretation: Small bilateral pleural effusions but otherwise no focal abnormality noted.  Official radiology report(s): Dg Chest 2 View  Result Date: 05/07/2019 CLINICAL DATA:  Atrial fibrillation EXAM: CHEST - 2 VIEW COMPARISON:  10/14/2018 FINDINGS: Cardiac shadow is stable. Aortic calcifications are again seen. Right jugular dialysis catheter is noted in satisfactory position. Small bilateral pleural effusions are noted. No acute bony abnormality is noted. IMPRESSION: Small bilateral pleural effusions. No other focal abnormality is noted. Electronically Signed   By: Inez Catalina M.D.   On: 05/07/2019 12:52    ____________________________________________   PROCEDURES  Procedure(s) performed: None  Procedures  Critical Care performed: No  ____________________________________________   INITIAL IMPRESSION / ASSESSMENT AND PLAN / ED COURSE  As part of my medical decision making, I reviewed the following data within the electronic MEDICAL RECORD NUMBER Notes from prior ED visits  58 year old male presenting to the emergency department after dialysis due to feeling palpitations.  EKG does show an uncontrolled atrial  fibrillation.  Patient believes that this started today and does not believe he is ever experienced anything like this in the past.  Cardiac work-up will be completed and he will be monitored closely.  Heart rate remained between 110 and 135.  Loading dose of diltiazem given with fairly good response.  His rate decreased to 100.  A drip was subsequently started.  Patient has remained pain-free and has denied shortness of breath during this ER visit.  Heart rate is now in the 80s to 90s.  He will be admitted for further work-up and transition.  He states he has never had a cardiologist in this area.  He denies any history of cardiac issues.  ____________________________________________   FINAL CLINICAL IMPRESSION(S) / ED DIAGNOSES  Final diagnoses:  New onset atrial fibrillation [facility])     ED Discharge Orders    None       Note:  This document was prepared using Dragon voice recognition software and may include unintentional dictation errors.    Victorino Dike, FNP 05/07/19 1704    Duffy Bruce, MD 05/08/19 2135

## 2019-05-07 NOTE — Consult Note (Signed)
Cardiology Consultation:   Patient ID: Matthew Mathis MRN: [id number]; DOB: [birth year]  Admit date: 05/07/2019 Date of Consult: 05/07/2019  Primary Care Provider: Alexis Goodell, MD Primary Cardiologist: [facility] Primary Electrophysiologist:  None    Patient Profile:   Matthew Mathis is a 58 y.o. male with a hx of renovascular hypertension, HTN, hyperthyroidism with thyroid nodule (awaiting bx results), ESRD (awaiting renal transplant), on dialysis x8 years, 2019 SBO with removal of PD (right sided HD catheter), and previous history of tobacco abuse who is being seen today for the evaluation of new onset Afib at the request of Dr. Brett Albino.  History of Present Illness:   Matthew Mathis is a 58 yo male without significant cardiac history or previous history of arrhythmia. Of note, he has a history of renovascular hypertension and is followed by nephrology for ESRD with HD while on the renal transplant list (Dialysis Tues, Thurs, Saturday). He has a right sided temporary HD catheter following PD removal d/t SBO in 2019. He is currently also followed by endocrinology for a 2.4cm TI-RADS 4 L thyroid nodule with renal transplant reportedly put on hold until bx results returned for his thyroid nodule.   Review of EMR notes he was previously seen by [facility] in 2011 after a referral from nephrology d/t c/o chest pain during dialysis treatment. Per review of this previous consultation, associated sx included occasional dizziness and headache. Symptoms reportedly lasted 10-20 minutes and were worse before dialysis. No reported CP with exertion. Plan at that time was to start ASA 81mg  and continue on Bystolic 5mg  daily.   He has been followed by [facility] since that time with multiple stress tests and echos since 2013. 2013 stress test was without signs of ischemia, EF 65%. 2016 repeat stress showed an unchanged EF and RV dilation. 2016 / 2017 TTE showed EF 55%, aortic sclerosis, and normal right  ventricular systolic function. 2019 TTE with EF greater than 55%, aortic sclerosis, degenerative mitral valve disease, and mitral annular calcification.  It was noted, however, to be a technically difficult study due to chest wall and lung interference. 03/04/2019 TTE showed mild LVH, EF 55-60%, mitral annular calcification, aortic sclerosis, moderate MR, mild LAE. 03/01/2019 NM myocardial perfusion study showed a small in size, mild in severity, fixed defect involving the apical segment. It was thought consistent with artifact but subtle scare could not be ruled out at that time. Global systolic function was normal with calculated EF 56%. He has no known history of arrhythmia with 03/2019 [facility] EKG showing normal sinus rhythm, left ventricular hypertrophy, prolonged QT, and no significant change since 11/23/2018 EKG.  PTA medications leading up to admission included metoprolol succinate 50mg  daily, amlodipine 5mg  daily, lasix 80mg  M/W/F/Sunday, losartan 100mg  daily, hydralazine 25mg  TID. During today's evaluation, patient reported only taking metoprolol for BP, however. He also denied any recent h/o stroke with most recent CT and MRI head (per [facility]where) negative for acute hemorrhage or ischemic infarct.   Today, 05/07/2019, he reported to [facility] ED with c/o racing HR during his hemodialysis (during which time he received Epogen on review of notes). He denied associated SOB, chest pain, palpitations, feelings of presyncope, and syncope. No reported [name] or feelings of volume overload. In the ED, rates were elevated to 139 with BP 142/86 and SpO2 99% ORA. Initial EKG showed Afib with RVR and ventricular rate 115.  Labs showed Na 138, K 3.8, Cr 4.25, Ca 8.7, AST 16, ALT 28, troponin negative, Hgb 10.1, RBC 3.36,  TSH 0.487. COVID-19 negative (with subsequent nose bleed d/t swab). CXR showed small bilateral pleural effusions. He was started on IV diltiazem and converted back to SR with heart rates in the 70s at time of  consultation.  He was seen by neurology with recommendation per review of neuro documentation for TEE to exclude LA thrombus / embolus risk and to stop diltiazem and start Toprol 100mg  daily for goal BP <140/90.  Cardiology consulted for new onset atrial fibrillation with RVR though as above, he converted to SR with IV diltiazem.  Past Medical History:  Diagnosis Date   Chronic renal failure    hx    Gout    hx   HTN (hypertension)     Past Surgical History:  Procedure Laterality Date   CAPD INSERTION Right 05/24/2017   Procedure: LAPAROSCOPIC INSERTION CONTINUOUS AMBULATORY PERITONEAL DIALYSIS  (CAPD) CATHETER;  Surgeon: Katha Cabal, MD;  Location: [facility] ORS;  Service: Vascular;  Laterality: Right;   DIALYSIS/PERMA CATHETER INSERTION N/A 05/07/2018   Procedure: DIALYSIS/PERMA CATHETER INSERTION;  Surgeon: Algernon Huxley, MD;  Location: [place] CV LAB;  Service: Cardiovascular;  Laterality: N/A;   DIALYSIS/PERMA CATHETER REMOVAL N/A 06/27/2017   Procedure: Dialysis/Perma Catheter Removal;  Surgeon: Katha Cabal, MD;  Location: [place] [facility];  Service: Cardiovascular;  Laterality: N/A;   DIALYSIS/PERMA CATHETER REMOVAL N/A 07/18/2018   Procedure: DIALYSIS/PERMA CATHETER REMOVAL;  Surgeon: Katha Cabal, MD;  Location: [place] CV LAB;  Service: Cardiovascular;  Laterality: N/A;   INSERTION OF DIALYSIS CATHETER Right 05/24/2017   Procedure: INSERTION OF DIALYSIS CATHETER ( TUNNELED CATH );  Surgeon: Katha Cabal, MD;  Location: [facility] ORS;  Service: Vascular;  Laterality: Right;   PERITONEAL CATHETER INSERTION     REMOVAL OF A DIALYSIS CATHETER Left 05/24/2017   Procedure: REMOVAL OF A DIALYSIS CATHETER ( PD CATH );  Surgeon: Katha Cabal, MD;  Location: [facility] ORS;  Service: Vascular;  Laterality: Left;   RENAL BIOPSY     non specific      Home Medications:  Prior to Admission medications   Medication Sig Start Date End Date Taking?  Authorizing Provider  allopurinol (ZYLOPRIM) 100 MG tablet Take 100 mg by mouth every evening.  07/25/16  Yes [provider]  amLODipine (NORVASC) 5 MG tablet Take 5 mg by mouth daily.   Yes [provider]  calcium acetate (PHOSLO) 667 MG capsule Take 667-1,334 mg by mouth See admin instructions. Take 3 capsules (1334mg ) by mouth three times a day with meals and take 1 capsule (667mg ) by mouth with snacks - total 8 capsules daily   Yes [provider]  cinacalcet (SENSIPAR) 60 MG tablet Take 60 mg by mouth daily.    Yes [provider]  furosemide (LASIX) 80 MG tablet Take 80 mg by mouth See admin instructions. Take 1 tablet (80mg ) by mouth daily every Monday, Wednesday, Friday and Sunday   Yes [provider]  hydrALAZINE (APRESOLINE) 25 MG tablet Take 25 mg by mouth 3 (three) times daily.   Yes [provider]  ketoconazole (NIZORAL) 200 MG tablet Take 400 mg by mouth daily.   Yes [provider]  losartan (COZAAR) 100 MG tablet Take 100 mg by mouth daily.   Yes [provider]  metoprolol succinate (TOPROL-XL) 50 MG 24 hr tablet Take 1 tablet (50 mg total) by mouth daily. Take with or immediately following a meal. 05/10/18  Yes Gouru, Illene Silver, MD  Multiple Vitamins-Minerals (  DIALYVITE 800/ULTRA D) TABS Take 1 tablet by mouth daily. 05/22/17  Yes [provider]  omeprazole (PRILOSEC) 20 MG capsule Take 20 mg by mouth daily.   Yes [provider]  predniSONE (DELTASONE) 20 MG tablet Take 20 mg by mouth daily as needed.   Yes [provider]  Skin Protectants, Misc. (EUCERIN) cream Apply 1 application topically as needed for dry skin.   Yes [provider]  pregabalin (LYRICA) 25 MG capsule Take 1 capsule (25 mg total) by mouth daily. Patient not taking: Reported on 05/07/2019 11/16/18 11/16/19  Epifanio Lesches, MD    Inpatient Medications: Scheduled Meds:  allopurinol  100 mg Oral QPM    calcium acetate  009-3,818 mg Oral See admin instructions   cinacalcet  60 mg Oral Daily   Dialyvite 800/Ultra D  1 tablet Oral Daily   ketoconazole  400 mg Oral Daily   metoprolol succinate  100 mg Oral Daily   pantoprazole  40 mg Oral Daily   Continuous Infusions:  diltiazem (CARDIZEM) infusion 5 mg/hr (05/07/19 1441)   PRN Meds: acetaminophen, metoprolol tartrate, ondansetron (ZOFRAN) IV  Allergies:   No Known Allergies  Social History:   Social History   Socioeconomic History   Marital status: Married    Spouse name: Not on file   Number of children: Not on file   Years of education: Not on file   Highest education level: Not on file  Occupational History   Not on file  Social Needs   Financial resource strain: Not on file   Food insecurity:    Worry: Not on file    Inability: Not on file   Transportation needs:    Medical: Not on file    Non-medical: Not on file  Tobacco Use   Smoking status: Former Smoker    Packs/day: 0.50    Types: Cigarettes    Last attempt to quit: 12/04/2010    Years since quitting: 8.4   Smokeless tobacco: Never Used   Tobacco comment: used to smoke 2-3 ppd, now smokes 3 cigarettes daily   Substance and Sexual Activity   Alcohol use: No   Drug use: No   Sexual activity: Not on file  Lifestyle   Physical activity:    Days per week: Not on file    Minutes per session: Not on file   Stress: Not on file  Relationships   Social connections:    Talks on phone: Not on file    Gets together: Not on file    Attends religious service: Not on file    Active member of club or organization: Not on file    Attends meetings of clubs or organizations: Not on file    Relationship status: Not on file   Intimate partner violence:    Fear of current or ex partner: Not on file    Emotionally abused: Not on file    Physically abused: Not on file    Forced sexual activity: Not on file  Other Topics Concern   Not on file    Social History Narrative   Works for a [employer]     Family History:    Family History  Problem Relation Age of Onset   Diabetes Father    CAD Father    Hypertension Mother        brother, sister    Kidney disease Mother      ROS:  Please see the history of present illness.  Review of Systems  Constitutional: Positive for weight loss.  HENT:       Nose bleed s/p COVID-19 swab  Respiratory: Negative for shortness of breath.   Cardiovascular: Negative for chest pain, palpitations and leg swelling.       Rapid "pounding" heart rate when in Afib, starting during HD treatment today  Gastrointestinal: Negative for blood in stool.  Genitourinary: Negative for hematuria.  Neurological: Negative for dizziness.  All other systems reviewed and are negative.   All other ROS reviewed and negative.     Physical Exam/Data:   Vitals:   05/07/19 1430 05/07/19 1445 05/07/19 1500 05/07/19 1515  BP: 138/86     Pulse: 74 93 71 90  Resp: (!) 30 19 (!) 23 13  Temp:      TempSrc:      SpO2: 100% 100% 100% 100%  Weight:      Height:       No intake or output data in the 24 hours ending 05/07/19 1550 Filed Weights   05/07/19 1138  Weight: 67 kg   Body mass index is 23.84 kg/m.  General:  Well nourished, well developed, in no acute distress HEENT: normal Lymph: no adenopathy Neck: no JVD Endocrine:  No thryomegaly Vascular: No carotid bruits; FA pulses 2+ bilaterally without bruits  Cardiac:  normal S1, S2; RRR; 1/6 systolic murmur Lungs:  clear to auscultation bilaterally, slightly reduced breath sounds at bases, no wheezing, rhonchi or rales  Abd: soft, nontender, no hepatomegaly  Ext: no bilateral lower extremity edema Musculoskeletal:  No deformities Skin: warm and dry  Neuro:  no focal abnormalities noted Psych:  Normal affect   EKG:  The EKG was personally reviewed and demonstrates:  Atrial fibrillation with RVR,  Telemetry:  Telemetry was personally reviewed  and demonstrates:  Afib with conversion to SR, current rates in the 70s  Relevant CV Studies: Pending updated TTE  Laboratory Data:  Chemistry Recent Labs  Lab 05/04/19 0628 05/07/19 1401  NA  --  138  K 5.8* 3.8  CL  --  99  CO2  --  27  GLUCOSE  --  93  BUN  --  21*  CREATININE  --  4.25*  CALCIUM  --  8.7*  GFRNONAA  --  14*  GFRAA  --  17*  ANIONGAP  --  12    Recent Labs  Lab 05/07/19 1401  PROT 6.5  ALBUMIN 4.3  AST 16  ALT 28  ALKPHOS 92  BILITOT 1.4*   Hematology Recent Labs  Lab 05/07/19 1401  WBC 6.8  RBC 3.36*  HGB 10.1*  HCT 31.0*  MCV 92.3  MCH 30.1  MCHC 32.6  RDW 16.1*  PLT 198   Cardiac Enzymes Recent Labs  Lab 05/07/19 1401  TROPONINI <0.03   No results for input(s): TROPIPOC in the last 168 hours.  BNPNo results for input(s): BNP, PROBNP in the last 168 hours.  DDimer No results for input(s): DDIMER in the last 168 hours.  Radiology/Studies:  Dg Chest 2 View  Result Date: 05/07/2019 CLINICAL DATA:  Atrial fibrillation EXAM: CHEST - 2 VIEW COMPARISON:  10/14/2018 FINDINGS: Cardiac shadow is stable. Aortic calcifications are again seen. Right jugular dialysis catheter is noted in satisfactory position. Small bilateral pleural effusions are noted. No acute bony abnormality is noted. IMPRESSION: Small bilateral pleural effusions. No other focal abnormality is noted. Electronically Signed   By: Inez Catalina M.D.   On: 05/07/2019 12:52  Assessment and Plan:   Paroxysmal Atrial Fibrillation with RVR -Reports feels chest pounding when in Afib. No known prior history of atrial fibrillation. On PTA metoprolol for fast HR and elevated BP in the past. No chest pain. -CHA2DS2VASc score of at least 1 (HTN) and pending updated echo. 03/2019 A1C 5.0. 02/2019 LDL 80. TSH 6/2 0.487. No past history of heart failure. No known history of stroke. -Given CHA2DS2VASc score of 1 and current nosebleed, will defer decision to start anticoagulation for now  and pending further workup to further risk stratify the patient. -Converted to SR by the time of evaluation and on diltiazem drip. Diltiazem stopped.  - Recommend restart PTA BB at increased dose of Toprol 100mg  daily and titrate as BP allows for optimal rate control. - Further recommendations pending echo.  HTN - Current BP elevated at 141/76.  - As above, history of renovascular HTN on multiple antihypertensives and pending transplant with nephrology following. Renal transplant pending bx results of thyroid nodule. - Continue BB as above with recent increase in dose per neurology. - Following confirmation that patient indeed took the other listed PTA antihypertensives, could restart slowly for more optimal BP support given elevated BP. PTA antihypertensives listed include amlodipine 5mg , lasix 80mg  (M/W/F/Sun), hydralazine 25mg  TID, and losartan 100mg  daily. Patient only recalled BB on today's evaluation. Could consider restart of ARB or amlodipine 5mg  daily (pending EF assessment with echo) if more BP support needed.  ESRD  - Daily BMET. - Per nephrology   Anemia - Likely of chronic disease. On Epogen. Per nephrology.   For questions or updates, please contact [place] Please consult [email or web address] for contact info under     Signed, Arvil Chaco, PA-C  05/07/2019 3:50 PM

## 2019-05-08 ENCOUNTER — Inpatient Hospital Stay (HOSPITAL_COMMUNITY)
Admit: 2019-05-08 | Discharge: 2019-05-08 | Disposition: A | Discharge status: Home / Self Care | Payer: Medicare Other | Attending: Nurse Practitioner | Admitting: Nurse Practitioner

## 2019-05-08 DIAGNOSIS — I4891 Unspecified atrial fibrillation: Secondary | ICD-10-CM

## 2019-05-08 DIAGNOSIS — I10 Essential (primary) hypertension: Secondary | ICD-10-CM

## 2019-05-08 LAB — CBC WITH DIFFERENTIAL/PLATELET
Abs Immature Granulocytes: 0.02 10*3/uL (ref 0.00–0.07)
Basophils Absolute: 0.1 10*3/uL (ref 0.0–0.1)
Basophils Relative: 1 %
Eosinophils Absolute: 1.1 10*3/uL — ABNORMAL HIGH (ref 0.0–0.5)
Eosinophils Relative: 12 %
HCT: 27.6 % — ABNORMAL LOW (ref 39.0–52.0)
Hemoglobin: 9.1 g/dL — ABNORMAL LOW (ref 13.0–17.0)
Immature Granulocytes: 0 %
Lymphocytes Relative: 7 %
Lymphs Abs: 0.6 10*3/uL — ABNORMAL LOW (ref 0.7–4.0)
MCH: 30.2 pg (ref 26.0–34.0)
MCHC: 33 g/dL (ref 30.0–36.0)
MCV: 91.7 fL (ref 80.0–100.0)
Monocytes Absolute: 0.9 10*3/uL (ref 0.1–1.0)
Monocytes Relative: 10 %
Neutro Abs: 6.4 10*3/uL (ref 1.7–7.7)
Neutrophils Relative %: 70 %
Platelets: 197 10*3/uL (ref 150–400)
RBC: 3.01 MIL/uL — ABNORMAL LOW (ref 4.22–5.81)
RDW: 16.2 % — ABNORMAL HIGH (ref 11.5–15.5)
WBC: 9.1 10*3/uL (ref 4.0–10.5)
nRBC: 0 % (ref 0.0–0.2)

## 2019-05-08 LAB — COMPREHENSIVE METABOLIC PANEL
ALT: 23 U/L (ref 0–44)
AST: 12 U/L — ABNORMAL LOW (ref 15–41)
Albumin: 4 g/dL (ref 3.5–5.0)
Alkaline Phosphatase: 77 U/L (ref 38–126)
Anion gap: 11 (ref 5–15)
BUN: 37 mg/dL — ABNORMAL HIGH (ref 6–20)
CO2: 27 mmol/L (ref 22–32)
Calcium: 8.6 mg/dL — ABNORMAL LOW (ref 8.9–10.3)
Chloride: 101 mmol/L (ref 98–111)
Creatinine, Ser: 6.12 mg/dL — ABNORMAL HIGH (ref 0.61–1.24)
GFR calc Af Amer: 11 mL/min — ABNORMAL LOW (ref >60)
GFR calc non Af Amer: 9 mL/min — ABNORMAL LOW (ref >60)
Glucose, Bld: 87 mg/dL (ref 70–99)
Potassium: 4.4 mmol/L (ref 3.5–5.1)
Sodium: 139 mmol/L (ref 135–145)
Total Bilirubin: 0.9 mg/dL (ref 0.3–1.2)
Total Protein: 6 g/dL — ABNORMAL LOW (ref 6.5–8.1)

## 2019-05-08 LAB — URINALYSIS, COMPLETE (UACMP) WITH MICROSCOPIC
Bacteria, UA: NONE SEEN
Bilirubin Urine: NEGATIVE
Glucose, UA: 50 mg/dL — AB
Hgb urine dipstick: NEGATIVE
Ketones, ur: NEGATIVE mg/dL
Leukocytes,Ua: NEGATIVE
Nitrite: NEGATIVE
Protein, ur: 100 mg/dL — AB
Specific Gravity, Urine: 1.005 (ref 1.005–1.030)
Squamous Epithelial / HPF: NONE SEEN (ref 0–5)
pH: 9 — ABNORMAL HIGH (ref 5.0–8.0)

## 2019-05-08 LAB — URINE DRUG SCREEN, QUALITATIVE (ARMC ONLY)
Amphetamines, Ur Screen: NOT DETECTED
Barbiturates, Ur Screen: NOT DETECTED
Benzodiazepine, Ur Scrn: NOT DETECTED
Cannabinoid 50 Ng, Ur ~~LOC~~: NOT DETECTED
Cocaine Metabolite,Ur ~~LOC~~: NOT DETECTED
MDMA (Ecstasy)Ur Screen: NOT DETECTED
Methadone Scn, Ur: NOT DETECTED
Opiate, Ur Screen: NOT DETECTED
Phencyclidine (PCP) Ur S: NOT DETECTED
Tricyclic, Ur Screen: NOT DETECTED

## 2019-05-08 LAB — ECHOCARDIOGRAM COMPLETE
Height: 66 in
Weight: 2329.6 oz

## 2019-05-08 LAB — HEMOGLOBIN A1C
Hgb A1c MFr Bld: 4.6 % — ABNORMAL LOW (ref 4.8–5.6)
Mean Plasma Glucose: 85.32 mg/dL

## 2019-05-08 LAB — MRSA PCR SCREENING: MRSA by PCR: NEGATIVE

## 2019-05-08 MED ORDER — KETOCONAZOLE 200 MG PO TABS
400.0000 mg | ORAL_TABLET | Freq: Every day | ORAL | Status: DC
  Filled 2019-05-08: qty 2

## 2019-05-08 MED ORDER — AMLODIPINE BESYLATE 5 MG PO TABS
5.0000 mg | ORAL_TABLET | Freq: Every day | ORAL | Status: DC
  Administered 2019-05-08 – 2019-05-09 (×2): 5 mg via ORAL
  Filled 2019-05-08 (×2): qty 1

## 2019-05-08 MED ORDER — HEPARIN SODIUM (PORCINE) 5000 UNIT/ML IJ SOLN
5000.0000 [IU] | Freq: Three times a day (TID) | INTRAMUSCULAR | Status: DC
  Administered 2019-05-08 – 2019-05-09 (×4): 5000 [IU] via SUBCUTANEOUS
  Filled 2019-05-08 (×4): qty 1

## 2019-05-08 NOTE — Progress Notes (Signed)
[facility], [place] 05/08/19  Subjective:  Patient known to our practice from outpatient dialysis This time admitted from dialysis yesterday because tachycardia and palpitations Found to have atrial fibrillation with rapid ventricular response.  Terminated with IV diltiazem This morning, symptomatically improved Metoprolol dose has been adjusted by cardiologist   Objective:  Vital signs in last 24 hours:  Temp:  [98.1 F (36.7 C)-98.9 F (37.2 C)] 98.1 F (36.7 C) (06/03 0738) Pulse Rate:  [67-103] 67 (06/03 0738) Resp:  [13-24] 16 (06/03 0738) BP: (117-152)/(68-88) 142/83 (06/03 0738) SpO2:  [96 %-100 %] 99 % (06/03 0738) Weight:  [66 kg] 66 kg (06/03 0433)  Weight change:  Filed Weights   05/07/19 1138 05/08/19 0433  Weight: 67 kg 66 kg    Intake/Output:    Intake/Output Summary (Last 24 hours) at 05/08/2019 1515 Last data filed at 05/08/2019 1459 Gross per 24 hour  Intake 504.23 ml  Output 100 ml  Net 404.23 ml     Physical Exam: General:  No acute distress, ambulatory in the room  HEENT  moist oral mucous membranes  Neck  supple, no masses  Pulm/lungs  clear to auscultation  CVS/Heart  regular rate and rhythm  Abdomen:   Soft, nontender  Extremities:  No peripheral edema  Neurologic:  Alert and oriented  Skin:  No acute rashes  Access:  Right IJ PermCath       Basic Metabolic Panel:  Recent Labs  Lab 05/04/19 0628 05/07/19 1401 05/08/19 0351  NA  --  138 139  K 5.8* 3.8 4.4  CL  --  99 101  CO2  --  27 27  GLUCOSE  --  93 87  BUN  --  21* 37*  CREATININE  --  4.25* 6.12*  CALCIUM  --  8.7* 8.6*     CBC: Recent Labs  Lab 05/07/19 1401 05/08/19 0351  WBC 6.8 9.1  NEUTROABS 4.8 6.4  HGB 10.1* 9.1*  HCT 31.0* 27.6*  MCV 92.3 91.7  PLT 198 197      Lab Results  Component Value Date   HEPBSAG Negative 05/07/2018   HEPBSAB Reactive 05/07/2018      Microbiology:  Recent Results (from the past 240  hour(s))  SARS Coronavirus 2 (CEPHEID - Performed in [place] hospital lab), Hosp Order     Status: None   Collection Time: 05/07/19  3:16 PM  Result Value Ref Range Status   SARS Coronavirus 2 NEGATIVE NEGATIVE Final    Comment: (NOTE) If result is NEGATIVE SARS-CoV-2 target nucleic acids are NOT DETECTED. The SARS-CoV-2 RNA is generally detectable in upper and lower  respiratory specimens during the acute phase of infection. The lowest  concentration of SARS-CoV-2 viral copies this assay can detect is 250  copies / mL. A negative result does not preclude SARS-CoV-2 infection  and should not be used as the sole basis for treatment or other  patient management decisions.  A negative result may occur with  improper specimen collection / handling, submission of specimen other  than nasopharyngeal swab, presence of viral mutation(s) within the  areas targeted by this assay, and inadequate number of viral copies  (<250 copies / mL). A negative result must be combined with clinical  observations, patient history, and epidemiological information. If result is POSITIVE SARS-CoV-2 target nucleic acids are DETECTED. The SARS-CoV-2 RNA is generally detectable in upper and lower  respiratory specimens dur ing the acute phase of infection.  Positive  results are indicative of active infection with SARS-CoV-2.  Clinical  correlation with patient history and other diagnostic information is  necessary to determine patient infection status.  Positive results do  not rule out bacterial infection or co-infection with other viruses. If result is PRESUMPTIVE POSTIVE SARS-CoV-2 nucleic acids MAY BE PRESENT.   A presumptive positive result was obtained on the submitted specimen  and confirmed on repeat testing.  While 2019 novel coronavirus  (SARS-CoV-2) nucleic acids may be present in the submitted sample  additional confirmatory testing may be necessary for epidemiological  and / or clinical  management purposes  to differentiate between  SARS-CoV-2 and other Sarbecovirus currently known to infect humans.  If clinically indicated additional testing with an alternate test  methodology [phone]) is advised. The SARS-CoV-2 RNA is generally  detectable in upper and lower respiratory sp ecimens during the acute  phase of infection. The expected result is Negative. Fact Sheet for Patients:  StrictlyIdeas.no Fact Sheet for Healthcare Providers: BankingDealers.co.za This test is not yet approved or cleared by the [place] FDA and has been authorized for detection and/or diagnosis of SARS-CoV-2 by FDA under an Emergency Use Authorization (EUA).  This EUA will remain in effect (meaning this test can be used) for the duration of the COVID-19 declaration under Section 564(b)(1) of the Act, 21 U.S.C. section 360bbb-3(b)(1), unless the authorization is terminated or revoked sooner. Performed at [facility], [address] [place], [place] [address]   MRSA PCR Screening     Status: None   Collection Time: 05/07/19 11:17 PM  Result Value Ref Range Status   MRSA by PCR NEGATIVE NEGATIVE Final    Comment:        The GeneXpert MRSA Assay (FDA approved for NASAL specimens only), is one component of a comprehensive MRSA colonization surveillance program. It is not intended to diagnose MRSA infection nor to guide or monitor treatment for MRSA infections. Performed at [facility], [address] [place], [place] [address]     Coagulation Studies: Recent Labs    05/07/19 1401  LABPROT 13.3  INR 1.0    Urinalysis: Recent Labs    05/08/19 0002  COLORURINE STRAW*  LABSPEC 1.005  PHURINE 9.0*  GLUCOSEU 50*  HGBUR NEGATIVE  BILIRUBINUR NEGATIVE  KETONESUR NEGATIVE  PROTEINUR 100*  NITRITE NEGATIVE  LEUKOCYTESUR NEGATIVE      Imaging: Dg Chest 2 View  Result Date: 05/07/2019 CLINICAL DATA:   Atrial fibrillation EXAM: CHEST - 2 VIEW COMPARISON:  10/14/2018 FINDINGS: Cardiac shadow is stable. Aortic calcifications are again seen. Right jugular dialysis catheter is noted in satisfactory position. Small bilateral pleural effusions are noted. No acute bony abnormality is noted. IMPRESSION: Small bilateral pleural effusions. No other focal abnormality is noted. Electronically Signed   By: Inez Catalina M.D.   On: 05/07/2019 12:52     Medications:    . allopurinol  100 mg Oral QPM  . amLODipine  5 mg Oral Daily  . calcium acetate  1,334 mg Oral TID WC  . cinacalcet  60 mg Oral Q supper  . heparin injection (subcutaneous)  5,000 Units Subcutaneous Q8H  . ketoconazole  400 mg Oral Daily  . metoprolol succinate  100 mg Oral Daily  . multivitamin  1 tablet Oral Daily  . pantoprazole  40 mg Oral Daily   acetaminophen, calcium acetate, metoprolol tartrate, ondansetron (ZOFRAN) IV  Assessment/ Plan:  58 y.o. male with end-stage renal disease, hypertension, hypothyroidism, previously on PD, history  of small bowel obstruction  [place]/TTS/210 min/66.5 kg/rt IJ PC  1.  End-stage renal disease 2.  Atrial fibrillation with RVR 3.  Anemia of chronic kidney disease 4.  Secondary hyperparathyroidism 5.  Hypertension with CKD  Lab Results  Component Value Date   HGB 9.1 (L) 05/08/2019   Lab Results  Component Value Date   CALCIUM 8.6 (L) 05/08/2019   PHOS 5.5 (H) 10/19/2018     Blood pressure trends and volume status are acceptable.  No acute indication for dialysis at present.  If still in the hospital, will dialyze tomorrow.  Continue ESA therapy for anemia.  Continue home dose of calcium acetate and cinacalcet    LOS: [place] 6/3/20203:15 PM  [place], [place]  Note: This note was prepared with Dragon dictation. Any transcription errors are unintentional

## 2019-05-08 NOTE — Progress Notes (Addendum)
Progress Note  Patient Name: Matthew Mathis Date of Encounter: 05/08/2019  Primary Cardiologist: End, [facility]  Subjective   Feels well no complaints, No palpitations, Tele reviewed , NSR Echo pending  Inpatient Medications    Scheduled Meds: . allopurinol  100 mg Oral QPM  . calcium acetate  1,334 mg Oral TID WC  . cinacalcet  60 mg Oral Q supper  . heparin injection (subcutaneous)  5,000 Units Subcutaneous Q8H  . ketoconazole  400 mg Oral Daily  . metoprolol succinate  100 mg Oral Daily  . multivitamin  1 tablet Oral Daily  . pantoprazole  40 mg Oral Daily   Continuous Infusions:  PRN Meds: acetaminophen, calcium acetate, metoprolol tartrate, ondansetron (ZOFRAN) IV   Vital Signs    Vitals:   05/07/19 2104 05/07/19 2308 05/08/19 0433 05/08/19 0738  BP: 125/72 (!) 152/81 (!) 146/88 (!) 142/83  Pulse:  78 71 67  Resp: 18 18 20 16   Temp:  98.9 F (37.2 C) 98.2 F (36.8 C) 98.1 F (36.7 C)  TempSrc:  Oral Oral Oral  SpO2:  97% 96% 99%  Weight:   66 kg   Height:        Intake/Output Summary (Last 24 hours) at 05/08/2019 0955 Last data filed at 05/08/2019 0003 Gross per 24 hour  Intake 264.23 ml  Output 100 ml  Net 164.23 ml   Last 3 Weights 05/08/2019 05/07/2019 12/14/2018  Weight (lbs) 145 lb 9.6 oz 147 lb 11.3 oz 154 lb  Weight (kg) 66.044 kg 67 kg 69.854 kg      Telemetry    NSR - Personally Reviewed  ECG     - Personally Reviewed  Physical Exam   GEN: No acute distress.   Neck: No JVD Cardiac: RRR, no murmurs, rubs, or gallops.  Respiratory: Clear to auscultation bilaterally. GI: Soft, nontender, non-distended  MS: No edema; No deformity. Neuro:  Nonfocal  Psych: Normal affect   Labs    Chemistry Recent Labs  Lab 05/04/19 0628 05/07/19 1401 05/08/19 0351  NA  --  138 139  K 5.8* 3.8 4.4  CL  --  99 101  CO2  --  27 27  GLUCOSE  --  93 87  BUN  --  21* 37*  CREATININE  --  4.25* 6.12*  CALCIUM  --  8.7* 8.6*  PROT  --  6.5 6.0*   ALBUMIN  --  4.3 4.0  AST  --  16 12*  ALT  --  28 23  ALKPHOS  --  92 77  BILITOT  --  1.4* 0.9  GFRNONAA  --  14* 9*  GFRAA  --  17* 11*  ANIONGAP  --  12 11     Hematology Recent Labs  Lab 05/07/19 1401 05/08/19 0351  WBC 6.8 9.1  RBC 3.36* 3.01*  HGB 10.1* 9.1*  HCT 31.0* 27.6*  MCV 92.3 91.7  MCH 30.1 30.2  MCHC 32.6 33.0  RDW 16.1* 16.2*  PLT 198 197    Cardiac Enzymes Recent Labs  Lab 05/07/19 1401  TROPONINI <0.03   No results for input(s): TROPIPOC in the last 168 hours.   BNPNo results for input(s): BNP, PROBNP in the last 168 hours.   DDimer No results for input(s): DDIMER in the last 168 hours.   Radiology    Dg Chest 2 View  Result Date: 05/07/2019 CLINICAL DATA:  Atrial fibrillation EXAM: CHEST - 2 VIEW COMPARISON:  10/14/2018 FINDINGS: Cardiac shadow is  stable. Aortic calcifications are again seen. Right jugular dialysis catheter is noted in satisfactory position. Small bilateral pleural effusions are noted. No acute bony abnormality is noted. IMPRESSION: Small bilateral pleural effusions. No other focal abnormality is noted. Electronically Signed   By: Inez Catalina M.D.   On: 05/07/2019 12:52    Cardiac Studies   Echo pending  Patient Profile     [name] is a 58 year old man with history of hypertension, hypothyroidism, and end-stage renal disease on hemodialysis (previously on peritoneal dialysis), whom we have been asked to see due to atrial fibrillation with rapid ventricular response.   Assessment & Plan    new atrial fibrillation with RVR  terminated with IV diltiazem.   Continue metoprolol succinate 10  CHADSVASC score is 1 (hypertension).   Echocardiogram pending  HTN Add norvasc 5 mg daily  ESRD Management of the ESRD per internal medicine and nephrology.    Total encounter time more than 25 minutes  Greater than 50% was spent in counseling and coordination of care with the patient   For questions or updates, please  contact [facility] Please consult [email or web address] for contact info under        Signed, Ida Rogue, MD  05/08/2019, 9:55 AM

## 2019-05-08 NOTE — Progress Notes (Signed)
[facility] [place] NAME: Matthew Mathis    MR#:  [id number]  DATE OF BIRTH:  [birth year]  SUBJECTIVE:  CHIEF COMPLAINT:   Chief Complaint  Patient presents with  . new onset afib   No new complaint this morning.  No chest pain.  Having breakfast.  REVIEW OF SYSTEMS:  Review of Systems  Constitutional: Negative for chills and fever.  HENT: Negative for hearing loss and tinnitus.   Eyes: Negative for blurred vision.  Respiratory: Negative for cough and shortness of breath.   Cardiovascular: Negative for chest pain and palpitations.  Gastrointestinal: Negative for abdominal pain, heartburn, nausea and vomiting.  Genitourinary: Negative for dysuria and urgency.  Musculoskeletal: Negative for myalgias and neck pain.  Skin: Negative for itching and rash.  Neurological: Negative for dizziness and headaches.  Psychiatric/Behavioral: Negative for depression and hallucinations.    DRUG ALLERGIES:  No Known Allergies VITALS:  Blood pressure (!) 142/83, pulse 67, temperature 98.1 F (36.7 C), temperature source Oral, resp. rate 16, height 5\' 6"  (1.676 m), weight 66 kg, SpO2 99 %. PHYSICAL EXAMINATION:  Physical Exam   GENERAL:  58 y.o.-year-old patient lying in the bed with no acute distress.  EYES: Pupils equal, round, reactive to light and accommodation. No scleral icterus. Extraocular muscles intact.  HEENT: Head atraumatic, normocephalic. Oropharynx and nasopharynx clear.  NECK:  Supple, no jugular venous distention. No thyroid enlargement, no tenderness.  LUNGS: Normal breath sounds bilaterally, no wheezing, rales,rhonchi or crepitation. No use of accessory muscles of respiration.  CARDIOVASCULAR: S1, S2 normal. No murmurs, rubs, or gallops. Irregular heart rythm ABDOMEN: Soft, nontender, nondistended. Bowel sounds present. No organomegaly or mass.  EXTREMITIES: No pedal edema, cyanosis, or clubbing.  NEUROLOGIC: Patient awake and  alert and oriented.  Moving all extremities with no focal deficit. PSYCHIATRIC: The patient is alert and oriented x 3.  SKIN: No obvious rash, lesion, or ulcer.   LABORATORY PANEL:  Male CBC Recent Labs  Lab 05/08/19 0351  WBC 9.1  HGB 9.1*  HCT 27.6*  PLT 197   ------------------------------------------------------------------------------------------------------------------ Chemistries  Recent Labs  Lab 05/08/19 0351  NA 139  K 4.4  CL 101  CO2 27  GLUCOSE 87  BUN 37*  CREATININE 6.12*  CALCIUM 8.6*  AST 12*  ALT 23  ALKPHOS 77  BILITOT 0.9   RADIOLOGY:  Dg Chest 2 View  Result Date: 05/07/2019 CLINICAL DATA:  Atrial fibrillation EXAM: CHEST - 2 VIEW COMPARISON:  10/14/2018 FINDINGS: Cardiac shadow is stable. Aortic calcifications are again seen. Right jugular dialysis catheter is noted in satisfactory position. Small bilateral pleural effusions are noted. No acute bony abnormality is noted. IMPRESSION: Small bilateral pleural effusions. No other focal abnormality is noted. Electronically Signed   By: Inez Catalina M.D.   On: 05/07/2019 12:52   ASSESSMENT AND PLAN:    58 y.o. male with a known history of hypertension, secondary hyperparathyroidism, anemia and chronic kidney disease,ESRD on HD Tuesday, Thursday, Saturday, presenting from dialysis with complaints of irregular heart rate.  1. AFib+RVR, - Stable no evidence of hypotensive, MI, angina, acute heart failure Heart rate better controlled this morning.  Already weaned off Cardizem drip.  Continue metoprolol. CHADS2 = 1 Follow-up on 2D echocardiogram Acute coronary syndrome ruled out  2. HTN -  Fairly controlled.  Norvasc added to metoprolol for better blood pressure control.    3. ESRD - On HD Tuesday, Thursday and Saturday. Nephrology to see patient.  For hemodialysis tomorrow  4. Anemia of CKD:Hgb stable at10.1.Receiving Epogenwith  treatment.  DVT prophylaxis; heparin   All the records are  reviewed and case discussed with Care Management/Social Worker. Management plans discussed with the patient, family and they are in agreement.  CODE STATUS: Full Code  TOTAL TIME TAKING CARE OF THIS PATIENT: 28 minutes.   More than 50% of the time was spent in counseling/coordination of care: YES  POSSIBLE D/C IN 1 DAY, DEPENDING ON CLINICAL CONDITION.   Azaria Stegman M.D on 05/08/2019 at 11:52 AM  Between 7am to 6pm - Pager - [phone]  After 6pm go to [email or web address] - Proofreader  [facility]  Office  [phone]  CC: Primary care physician; Alexis Goodell, MD  Note: This dictation was prepared with Dragon dictation along with smaller phrase technology. Any transcriptional errors that result from this process are unintentional.

## 2019-05-08 NOTE — Progress Notes (Signed)
*  PRELIMINARY RESULTS* Echocardiogram 2D Echocardiogram has been performed.  Sherrie Sport 05/08/2019, 12:20 PM

## 2019-05-08 NOTE — Plan of Care (Signed)
  Problem: Clinical Measurements: Goal: Will remain free from infection Outcome: Progressing Goal: Respiratory complications will improve Outcome: Progressing Note:  On room air   Problem: Activity: Goal: Risk for activity intolerance will decrease Outcome: Progressing Note:  Independent in room, tolerating well   Problem: Nutrition: Goal: Adequate nutrition will be maintained Outcome: Progressing   Problem: Coping: Goal: Level of anxiety will decrease Outcome: Progressing   Problem: Elimination: Goal: Will not experience complications related to urinary retention Outcome: Progressing   Problem: Pain Managment: Goal: General experience of comfort will improve Outcome: Progressing Note:  No complaints of pain this shift   Problem: Safety: Goal: Ability to remain free from injury will improve Outcome: Progressing   Problem: Skin Integrity: Goal: Risk for impaired skin integrity will decrease Outcome: Progressing   Problem: Cardiac: Goal: Ability to achieve and maintain adequate cardiopulmonary perfusion will improve Outcome: Progressing Note:  Now back in sinus rhythm    Problem: Education: Goal: Knowledge of General Education information will improve Description Including pain rating scale, medication(s)/side effects and non-pharmacologic comfort measures Outcome: Completed/Met

## 2019-05-09 LAB — PHOSPHORUS: Phosphorus: 5.2 mg/dL — ABNORMAL HIGH (ref 2.5–4.6)

## 2019-05-09 LAB — CBC
HCT: 27.8 % — ABNORMAL LOW (ref 39.0–52.0)
Hemoglobin: 9.1 g/dL — ABNORMAL LOW (ref 13.0–17.0)
MCH: 30 pg (ref 26.0–34.0)
MCHC: 32.7 g/dL (ref 30.0–36.0)
MCV: 91.7 fL (ref 80.0–100.0)
Platelets: 178 10*3/uL (ref 150–400)
RBC: 3.03 MIL/uL — ABNORMAL LOW (ref 4.22–5.81)
RDW: 15.9 % — ABNORMAL HIGH (ref 11.5–15.5)
WBC: 6.8 10*3/uL (ref 4.0–10.5)
nRBC: 0 % (ref 0.0–0.2)

## 2019-05-09 LAB — BASIC METABOLIC PANEL
Anion gap: 14 (ref 5–15)
BUN: 59 mg/dL — ABNORMAL HIGH (ref 6–20)
CO2: 24 mmol/L (ref 22–32)
Calcium: 8.8 mg/dL — ABNORMAL LOW (ref 8.9–10.3)
Chloride: 100 mmol/L (ref 98–111)
Creatinine, Ser: 8.46 mg/dL — ABNORMAL HIGH (ref 0.61–1.24)
GFR calc Af Amer: 7 mL/min — ABNORMAL LOW (ref >60)
GFR calc non Af Amer: 6 mL/min — ABNORMAL LOW (ref >60)
Glucose, Bld: 91 mg/dL (ref 70–99)
Potassium: 5.2 mmol/L — ABNORMAL HIGH (ref 3.5–5.1)
Sodium: 138 mmol/L (ref 135–145)

## 2019-05-09 LAB — MAGNESIUM: Magnesium: 2.1 mg/dL (ref 1.7–2.4)

## 2019-05-09 MED ORDER — HEPARIN SODIUM (PORCINE) 1000 UNIT/ML DIALYSIS
20.0000 [IU]/kg | INTRAMUSCULAR | Status: DC | PRN
  Filled 2019-05-09: qty 2

## 2019-05-09 MED ORDER — CHLORHEXIDINE GLUCONATE CLOTH 2 % EX PADS
6.0000 | MEDICATED_PAD | Freq: Every day | CUTANEOUS | Status: DC
  Administered 2019-05-09: 6 via TOPICAL

## 2019-05-09 MED ORDER — ASPIRIN EC 81 MG PO TBEC: 81.0000 mg | DELAYED_RELEASE_TABLET | Freq: Every day | ORAL | 0 refills | Status: AC

## 2019-05-09 MED ORDER — METOPROLOL SUCCINATE ER 100 MG PO TB24: 100.0000 mg | ORAL_TABLET | Freq: Every day | ORAL | 0 refills | Status: DC

## 2019-05-09 MED ORDER — KETOCONAZOLE 200 MG PO TABS: 400.0000 mg | ORAL_TABLET | Freq: Every day | ORAL | Status: DC

## 2019-05-09 NOTE — Plan of Care (Signed)
  Problem: Elimination: Goal: Will not experience complications related to bowel motility Outcome: Progressing Goal: Will not experience complications related to urinary retention Outcome: Progressing   Problem: Pain Managment: Goal: General experience of comfort will improve Outcome: Progressing Note:  No complaints of pain this shift   Problem: Safety: Goal: Ability to remain free from injury will improve Outcome: Progressing   Problem: Skin Integrity: Goal: Risk for impaired skin integrity will decrease Outcome: Progressing   Problem: Cardiac: Goal: Ability to achieve and maintain adequate cardiopulmonary perfusion will improve Outcome: Progressing Note:  Now in sinus rhythm    Problem: Clinical Measurements: Goal: Respiratory complications will improve Outcome: Completed/Met Note:  Room air   Problem: Activity: Goal: Risk for activity intolerance will decrease Outcome: Completed/Met Note:  Independent in room    Problem: Nutrition: Goal: Adequate nutrition will be maintained Outcome: Completed/Met   Problem: Coping: Goal: Level of anxiety will decrease Outcome: Completed/Met

## 2019-05-09 NOTE — Progress Notes (Signed)
Established Hemodialysis patient, known at [place] 7:05. Patient will resume same chair time at discharge.

## 2019-05-09 NOTE — Discharge Instructions (Signed)
Atrial Fibrillation Atrial fibrillation is a type of irregular or rapid heartbeat (arrhythmia). In atrial fibrillation, the top part of the heart (atria) quivers in a chaotic pattern. This makes the heart unable to pump blood normally. Having atrial fibrillation can increase your risk for other health problems, such as:  Blood can pool in the atria and form clots. If a clot travels to the brain, it can cause a stroke.  The heart muscle may weaken from the irregular blood flow. This can cause heart failure. Atrial fibrillation may start suddenly and stop on its own, or it may become a long-lasting problem. What are the causes? This condition is caused by some heart-related conditions or procedures, including:  High blood pressure. This is the most common cause.  Heart failure.  Heart valve conditions.  Inflammation of the sac that surrounds the heart (pericarditis).  Heart surgery.  Coronary artery disease.  Certain heart rhythm disorders, such as Wolf-Parkinson-White syndrome. Other causes include:  Pneumonia.  Obstructive sleep apnea.  Lung cancer.  Thyroid problems, especially if the thyroid is overactive (hyperthyroidism).  Excessive alcohol or drug use. Sometimes, the cause of this condition is not known. What increases the risk? This condition is more likely to develop in:  Older people.  People who smoke.  People who have diabetes mellitus.  People who are overweight (obese).  Athletes who exercise vigorously.  People who have a family history. What are the signs or symptoms? Symptoms of this condition include:  A feeling that your heart is beating rapidly or irregularly.  A feeling of discomfort or pain in your chest.  Shortness of breath.  Sudden light-headedness or weakness.  Getting tired easily during exercise. In some cases, there are no symptoms. How is this diagnosed? Your health care provider may be able to detect atrial fibrillation when  taking your pulse. If detected, this condition may be diagnosed with:  Electrocardiogram (ECG).  Ambulatory cardiac monitor. This device records your heartbeats for 24 hours or more.  Transthoracic echocardiogram (TTE) to evaluate how blood flows through your heart.  Transesophageal echocardiogram (TEE) to view more detailed images of your heart.  A stress test.  Imaging tests, such as a CT scan or chest X-ray.  Blood tests. How is this treated? This condition may be treated with:  Medicines to slow down the heart rate or bring the heart's rhythm back to normal.  Medicines to prevent blood clots from forming.  Electrical cardioversion. This delivers a low-energy shock to the heart to reset its rhythm.  Ablation. This procedure destroys the part of the heart tissue that sends abnormal signals.  Left atrial appendage occlusion/excision. This seals off a common place in the atria where blood clots can form (left atrial appendage). The goal of treatment is to prevent blood clots from forming and to keep your heart beating at a normal rate and rhythm. Treatment depends on underlying medical conditions and how you feel when you are experiencing fibrillation. Follow these instructions at home: Medicines  Take over-the counter and prescription medicines only as told by your health care provider.  If your health care provider prescribed a blood-thinning medicine (anticoagulant), take it exactly as told. Taking too much blood-thinning medicine can cause bleeding. Taking too little can enable a blood clot to form and travel to the brain, causing a stroke. Lifestyle      Do not use any products that contain nicotine or tobacco, such as cigarettes and e-cigarettes. If you need help quitting, ask your health   care provider.  Do not drink beverages that contain caffeine, such as coffee, soda, and tea.  Follow diet instructions as told by your health care provider.  Exercise regularly as  told by your health care provider.  Do not drink alcohol. General instructions  If you have obstructive sleep apnea, manage your condition as told by your health care provider.  Maintain a healthy weight. Do not use diet pills unless your health care provider approves. Diet pills may make heart problems worse.  Keep all follow-up visits as told by your health care provider. This is important. Contact a health care provider if you:  Notice a change in the rate, rhythm, or strength of your heartbeat.  Are taking an anticoagulant and you notice increased bruising.  Tire more easily when you exercise or exert yourself.  Have a sudden change in weight. Get help right away if you have:   Chest pain, abdominal pain, sweating, or weakness.  Difficulty breathing.  Blood in your vomit, stool (feces), or urine.  Any symptoms of a stroke. "BE FAST" is an easy way to remember the main warning signs of a stroke: ? B - Balance. Signs are dizziness, sudden trouble walking, or loss of balance. ? E - Eyes. Signs are trouble seeing or a sudden change in vision. ? F - Face. Signs are sudden weakness or numbness of the face, or the face or eyelid drooping on one side. ? A - Arms. Signs are weakness or numbness in an arm. This happens suddenly and usually on one side of the body. ? S - Speech. Signs are sudden trouble speaking, slurred speech, or trouble understanding what people say. ? T - Time. Time to call emergency services. Write down what time symptoms started.  Other signs of a stroke, such as: ? A sudden, severe headache with no known cause. ? Nausea or vomiting. ? Seizure. These symptoms may represent a serious problem that is an emergency. Do not wait to see if the symptoms will go away. Get medical help right away. Call your local emergency services (911 in the [place] Do not drive yourself to the hospital. Summary  Atrial fibrillation is a type of irregular or rapid heartbeat  (arrhythmia).  Symptoms include a feeling that your heart is beating fast or irregularly. In some cases, you may not have symptoms.  The condition is treated with medicines to slow down the heart rate or bring the heart's rhythm back to normal. You may also need blood-thinning medicines to prevent blood clots.  Get help right away if you have symptoms or signs of a stroke. This information is not intended to replace advice given to you by your health care provider. Make sure you discuss any questions you have with your health care provider. Document Released: 11/21/2005 Document Revised: 01/12/2018 Document Reviewed: 01/12/2018 Elsevier Interactive Patient Education  2019 [employer]  

## 2019-05-09 NOTE — TOC Transition Note (Signed)
Transition of Care [facility]) - CM/SW Discharge Note   Patient Details  Name: Matthew Mathis MRN: [id number] Date of Birth: Dec 31, 1960  Transition of Care [facility] CM/SW Contact:  Katrina Stack, RN Phone Number: 05/09/2019, 2:56 PM     Clinical Narrative:    Patientyfor discharge home today. This CM has made two attempts today to assess and patient has not been available.  Risk score is  24% and highest points deal with the number of number of active orders, 3 admission in the last year, .    Independent in all adls, denies issues accessing medical care, obtaining medications or with transportation.  Current with  PCP. Chronic HD and Elvera Bicker has been aware of admission.  Patient to discharge home today.  No discharge needs identified by members of the care team          Patient Goals and CMS Choice        Discharge Placement                       Discharge Plan and Services                                     Social Determinants of Health (SDOH) Interventions     Readmission Risk Interventions No flowsheet data found.

## 2019-05-09 NOTE — Progress Notes (Signed)
[facility], [place] 05/09/19  Subjective:  Patient known to our practice from outpatient dialysis This time admitted from dialysis yesterday because tachycardia and palpitations Found to have atrial fibrillation with rapid ventricular response.  Terminated with IV diltiazem This morning, symptomatically improved Metoprolol dose has been adjusted by cardiologist Patient seen during dialysis Tolerating well   Objective:  Vital signs in last 24 hours:  Temp:  [97.6 F (36.4 C)-98.6 F (37 C)] 97.6 F (36.4 C) (06/04 0829) Pulse Rate:  [63-72] 63 (06/04 0829) Resp:  [16-19] 19 (06/04 0829) BP: (136-154)/(85-92) 154/92 (06/04 0829) SpO2:  [95 %-96 %] 95 % (06/04 0829) Weight:  [66 kg] 66 kg (06/04 0429)  Weight change: -0.956 kg Filed Weights   05/07/19 1138 05/08/19 0433 05/09/19 0429  Weight: 67 kg 66 kg 66 kg    Intake/Output:    Intake/Output Summary (Last 24 hours) at 05/09/2019 1715 Last data filed at 05/09/2019 0900 Gross per 24 hour  Intake 240 ml  Output 175 ml  Net 65 ml     Physical Exam: General:  No acute distress, ambulatory in the room  HEENT  moist oral mucous membranes  Neck  supple, no masses  Pulm/lungs  clear to auscultation  CVS/Heart  regular rate and rhythm  Abdomen:   Soft, nontender  Extremities:  No peripheral edema  Neurologic:  Alert and oriented  Skin:  No acute rashes  Access:  Right IJ PermCath       Basic Metabolic Panel:  Recent Labs  Lab 05/04/19 0628 05/07/19 1401 05/08/19 0351 05/09/19 0618  NA  --  138 139 138  K 5.8* 3.8 4.4 5.2*  CL  --  99 101 100  CO2  --  27 27 24   GLUCOSE  --  93 87 91  BUN  --  21* 37* 59*  CREATININE  --  4.25* 6.12* 8.46*  CALCIUM  --  8.7* 8.6* 8.8*  MG  --   --   --  2.1  PHOS  --   --   --  5.2*     CBC: Recent Labs  Lab 05/07/19 1401 05/08/19 0351 05/09/19 0618  WBC 6.8 9.1 6.8  NEUTROABS 4.8 6.4  --   HGB 10.1* 9.1* 9.1*  HCT 31.0* 27.6* 27.8*   MCV 92.3 91.7 91.7  PLT 198 197 178      Lab Results  Component Value Date   HEPBSAG Negative 05/07/2018   HEPBSAB Reactive 05/07/2018      Microbiology:  Recent Results (from the past 240 hour(s))  SARS Coronavirus 2 (CEPHEID - Performed in [place] hospital lab), Hosp Order     Status: None   Collection Time: 05/07/19  3:16 PM  Result Value Ref Range Status   SARS Coronavirus 2 NEGATIVE NEGATIVE Final    Comment: (NOTE) If result is NEGATIVE SARS-CoV-2 target nucleic acids are NOT DETECTED. The SARS-CoV-2 RNA is generally detectable in upper and lower  respiratory specimens during the acute phase of infection. The lowest  concentration of SARS-CoV-2 viral copies this assay can detect is 250  copies / mL. A negative result does not preclude SARS-CoV-2 infection  and should not be used as the sole basis for treatment or other  patient management decisions.  A negative result may occur with  improper specimen collection / handling, submission of specimen other  than nasopharyngeal swab, presence of viral mutation(s) within the  areas targeted by this assay, and inadequate number of  viral copies  (<250 copies / mL). A negative result must be combined with clinical  observations, patient history, and epidemiological information. If result is POSITIVE SARS-CoV-2 target nucleic acids are DETECTED. The SARS-CoV-2 RNA is generally detectable in upper and lower  respiratory specimens dur ing the acute phase of infection.  Positive  results are indicative of active infection with SARS-CoV-2.  Clinical  correlation with patient history and other diagnostic information is  necessary to determine patient infection status.  Positive results do  not rule out bacterial infection or co-infection with other viruses. If result is PRESUMPTIVE POSTIVE SARS-CoV-2 nucleic acids MAY BE PRESENT.   A presumptive positive result was obtained on the submitted specimen  and confirmed on repeat  testing.  While 2019 novel coronavirus  (SARS-CoV-2) nucleic acids may be present in the submitted sample  additional confirmatory testing may be necessary for epidemiological  and / or clinical management purposes  to differentiate between  SARS-CoV-2 and other Sarbecovirus currently known to infect humans.  If clinically indicated additional testing with an alternate test  methodology [phone]) is advised. The SARS-CoV-2 RNA is generally  detectable in upper and lower respiratory sp ecimens during the acute  phase of infection. The expected result is Negative. Fact Sheet for Patients:  StrictlyIdeas.no Fact Sheet for Healthcare Providers: BankingDealers.co.za This test is not yet approved or cleared by the [place] FDA and has been authorized for detection and/or diagnosis of SARS-CoV-2 by FDA under an Emergency Use Authorization (EUA).  This EUA will remain in effect (meaning this test can be used) for the duration of the COVID-19 declaration under Section 564(b)(1) of the Act, 21 U.S.C. section 360bbb-3(b)(1), unless the authorization is terminated or revoked sooner. Performed at [facility], [address] [place], [place] [address]   MRSA PCR Screening     Status: None   Collection Time: 05/07/19 11:17 PM  Result Value Ref Range Status   MRSA by PCR NEGATIVE NEGATIVE Final    Comment:        The GeneXpert MRSA Assay (FDA approved for NASAL specimens only), is one component of a comprehensive MRSA colonization surveillance program. It is not intended to diagnose MRSA infection nor to guide or monitor treatment for MRSA infections. Performed at [facility], [address] [place], [place] [address]     Coagulation Studies: Recent Labs    05/07/19 1401  LABPROT 13.3  INR 1.0    Urinalysis: Recent Labs    05/08/19 0002  COLORURINE STRAW*  LABSPEC 1.005  PHURINE 9.0*  GLUCOSEU 50*   HGBUR NEGATIVE  BILIRUBINUR NEGATIVE  KETONESUR NEGATIVE  PROTEINUR 100*  NITRITE NEGATIVE  LEUKOCYTESUR NEGATIVE      Imaging: No results found.   Medications:    . allopurinol  100 mg Oral QPM  . amLODipine  5 mg Oral Daily  . calcium acetate  1,334 mg Oral TID WC  . Chlorhexidine Gluconate Cloth  6 each Topical Q0600  . cinacalcet  60 mg Oral Q supper  . heparin injection (subcutaneous)  5,000 Units Subcutaneous Q8H  . metoprolol succinate  100 mg Oral Daily  . multivitamin  1 tablet Oral Daily  . pantoprazole  40 mg Oral Daily   acetaminophen, calcium acetate, heparin, metoprolol tartrate, ondansetron (ZOFRAN) IV  Assessment/ Plan:  58 y.o. male with end-stage renal disease, hypertension, hypothyroidism, previously on PD, history of small bowel obstruction  [facility] [place]/TTS/210 min/66.5 kg/rt IJ PC  1.  End-stage renal disease 2.  Atrial fibrillation with RVR 3.  Anemia of chronic kidney disease 4.  Secondary hyperparathyroidism 5.  Hypertension with CKD  Lab Results  Component Value Date   HGB 9.1 (L) 05/09/2019   Lab Results  Component Value Date   CALCIUM 8.8 (L) 05/09/2019   PHOS 5.2 (H) 05/09/2019      Continue ESA therapy for anemia.  Continue home dose of calcium acetate and cinacalcet Metoprolol (TOPROL) to 100 mg daily   LOS: [address] 6/4/20205:15 PM  [place], [place]  Note: This note was prepared with Dragon dictation. Any transcription errors are unintentional

## 2019-05-09 NOTE — Discharge Summary (Signed)
[facility] NAME: Matthew Mathis    MR#:  [id number]  DATE OF BIRTH:  [birth year]  DATE OF ADMISSION:  05/07/2019   ADMITTING PHYSICIAN: Sela Hua, MD  DATE OF DISCHARGE: No discharge date for patient encounter.  PRIMARY CARE PHYSICIAN: Alexis Goodell, MD   ADMISSION DIAGNOSIS:  New onset atrial fibrillation ([place]) [I48.91] DISCHARGE DIAGNOSIS:  Active Problems:   ESRD (end stage renal disease) ([place])   Atrial fibrillation with rapid ventricular response ([place])  SECONDARY DIAGNOSIS:   Past Medical History:  Diagnosis Date   Chronic renal failure    hx    Gout    hx   HTN (hypertension)    HOSPITAL COURSE:  Chief complaint; new onset atrial fibrillation  History of presenting complaint; Matthew Mathis  is a 58 y.o. male with a known history of hypertension, secondary hyperparathyroidism, anemia and chronic kidney disease,ESRD on HD Tuesday, Thursday, Saturday, presented from dialysis with complaints of irregular heart rate. Patient was found to be in atrial fibrillation with rapid ventricular response which is new.  Admitted to medical service for further evaluation and management.  Please refer to the H&P for further details  Hospital course; 1.AFib+RVR, -newly diagnosed.  Type of A. fib unspecified at this time. Heart rate remains controlled.  Patient initially required Cardizem drip which was weaned off.  Metoprolol was increased from 50 to 100 mg p.o. daily.  Seen by cardiologist.  Appreciate input.  2D echocardiogram done with normal ejection fraction.  Glycosylated hemoglobin level of 4.6.  Acute coronary syndrome ruled out. CHADS2 =1.cardiologist does not recommend anticoagulation at this time.  Patient started on aspirin 81 mg daily.  Appointment made to follow-up with cardiologist post discharge from the hospital   2.HTN-  Fairly controlled.  Resume home blood pressure meds on discharge.  Follow-up with  primary care physician for monitoring  3. ESRD - On HDTuesday, Thursday and Saturday. Seen by nephrologist.  Plans for discharge after hemodialysis today   4.Anemia of CKD:Hgb stable at 9.1.Receiving Epogenwithtreatment  DISCHARGE CONDITIONS:  Clinically and hemodynamically stable for discharge after hemodialysis today CONSULTS OBTAINED:  Treatment Team:  Nelva Bush, MD Murlean Iba, MD DRUG ALLERGIES:  No Known Allergies DISCHARGE MEDICATIONS:   Allergies as of 05/09/2019   No Known Allergies     Medication List    TAKE these medications   allopurinol 100 MG tablet Commonly known as:  ZYLOPRIM Take 100 mg by mouth every evening.   amLODipine 5 MG tablet Commonly known as:  NORVASC Take 5 mg by mouth daily.   aspirin EC 81 MG tablet Take 1 tablet (81 mg total) by mouth daily.   calcium acetate 667 MG capsule Commonly known as:  PHOSLO Take 667-1,334 mg by mouth See admin instructions. Take 3 capsules (1334mg ) by mouth three times a day with meals and take 1 capsule (667mg ) by mouth with snacks - total 8 capsules daily   cinacalcet 60 MG tablet Commonly known as:  SENSIPAR Take 60 mg by mouth daily.   Dialyvite 800/Ultra D Tabs Take 1 tablet by mouth daily.   eucerin cream Apply 1 application topically as needed for dry skin.   furosemide 80 MG tablet Commonly known as:  LASIX Take 80 mg by mouth See admin instructions. Take 1 tablet (80mg ) by mouth daily every Monday, Wednesday, Friday and Sunday   hydrALAZINE 25 MG tablet Commonly known as:  APRESOLINE Take 25 mg by mouth 3 (  three) times daily.   ketoconazole 200 MG tablet Commonly known as:  NIZORAL Take 400 mg by mouth daily.   losartan 100 MG tablet Commonly known as:  COZAAR Take 100 mg by mouth daily.   metoprolol succinate 100 MG 24 hr tablet Commonly known as:  TOPROL-XL Take 1 tablet (100 mg total) by mouth daily. Take with or immediately following a meal. Start taking on:   May 10, 2019 What changed:    medication strength  how much to take   omeprazole 20 MG capsule Commonly known as:  PRILOSEC Take 20 mg by mouth daily.   predniSONE 20 MG tablet Commonly known as:  DELTASONE Take 20 mg by mouth daily as needed.   pregabalin 25 MG capsule Commonly known as:  Lyrica Take 1 capsule (25 mg total) by mouth daily.        DISCHARGE INSTRUCTIONS:   DIET:  Renal diet DISCHARGE CONDITION:  Stable ACTIVITY:  Activity as tolerated OXYGEN:  Home Oxygen: No.  Oxygen Delivery: room air DISCHARGE LOCATION:  home   If you experience worsening of your admission symptoms, develop shortness of breath, life threatening emergency, suicidal or homicidal thoughts you must seek medical attention immediately by calling 911 or calling your MD immediately  if symptoms less severe.  You Must read complete instructions/literature along with all the possible adverse reactions/side effects for all the Medicines you take and that have been prescribed to you. Take any new Medicines after you have completely understood and accpet all the possible adverse reactions/side effects.   Please note  You were cared for by a hospitalist during your hospital stay. If you have any questions about your discharge medications or the care you received while you were in the hospital after you are discharged, you can call the unit and asked to speak with the hospitalist on call if the hospitalist that took care of you is not available. Once you are discharged, your primary care physician will handle any further medical issues. Please note that NO REFILLS for any discharge medications will be authorized once you are discharged, as it is imperative that you return to your primary care physician (or establish a relationship with a primary care physician if you do not have one) for your aftercare needs so that they can reassess your need for medications and monitor your lab values.    On the  day of Discharge:  VITAL SIGNS:  Blood pressure (!) 154/92, pulse 63, temperature 97.6 F (36.4 C), temperature source Oral, resp. rate 19, height 5\' 6"  (1.676 m), weight 66 kg, SpO2 95 %. PHYSICAL EXAMINATION:  GENERAL:  58 y.o.-year-old patient lying in the bed with no acute distress.  EYES: Pupils equal, round, reactive to light and accommodation. No scleral icterus. Extraocular muscles intact.  HEENT: Head atraumatic, normocephalic. Oropharynx and nasopharynx clear.  NECK:  Supple, no jugular venous distention. No thyroid enlargement, no tenderness.  LUNGS: Normal breath sounds bilaterally, no wheezing, rales,rhonchi or crepitation. No use of accessory muscles of respiration.  CARDIOVASCULAR: Irregularly irregular .  ABDOMEN: Soft, non-tender, non-distended. Bowel sounds present. No organomegaly or mass.  EXTREMITIES: No pedal edema, cyanosis, or clubbing.  NEUROLOGIC: Cranial nerves II through XII are intact. Muscle strength 5/5 in all extremities. Sensation intact. Gait not checked.  PSYCHIATRIC: The patient is alert and oriented x 3.  SKIN: No obvious rash, lesion, or ulcer.  DATA REVIEW:   CBC Recent Labs  Lab 05/09/19 0618  WBC 6.8  HGB 9.1*  HCT 27.8*  PLT 178    Chemistries  Recent Labs  Lab 05/08/19 0351 05/09/19 0618  NA 139 138  K 4.4 5.2*  CL 101 100  CO2 27 24  GLUCOSE 87 91  BUN 37* 59*  CREATININE 6.12* 8.46*  CALCIUM 8.6* 8.8*  MG  --  2.1  AST 12*  --   ALT 23  --   ALKPHOS 77  --   BILITOT 0.9  --      Microbiology Results  Results for orders placed or performed during the hospital encounter of 05/07/19  SARS Coronavirus 2 (CEPHEID - Performed in [place] hospital lab), Hosp Order     Status: None   Collection Time: 05/07/19  3:16 PM  Result Value Ref Range Status   SARS Coronavirus 2 NEGATIVE NEGATIVE Final    Comment: (NOTE) If result is NEGATIVE SARS-CoV-2 target nucleic acids are NOT DETECTED. The SARS-CoV-2 RNA is generally  detectable in upper and lower  respiratory specimens during the acute phase of infection. The lowest  concentration of SARS-CoV-2 viral copies this assay can detect is 250  copies / mL. A negative result does not preclude SARS-CoV-2 infection  and should not be used as the sole basis for treatment or other  patient management decisions.  A negative result may occur with  improper specimen collection / handling, submission of specimen other  than nasopharyngeal swab, presence of viral mutation(s) within the  areas targeted by this assay, and inadequate number of viral copies  (<250 copies / mL). A negative result must be combined with clinical  observations, patient history, and epidemiological information. If result is POSITIVE SARS-CoV-2 target nucleic acids are DETECTED. The SARS-CoV-2 RNA is generally detectable in upper and lower  respiratory specimens dur ing the acute phase of infection.  Positive  results are indicative of active infection with SARS-CoV-2.  Clinical  correlation with patient history and other diagnostic information is  necessary to determine patient infection status.  Positive results do  not rule out bacterial infection or co-infection with other viruses. If result is PRESUMPTIVE POSTIVE SARS-CoV-2 nucleic acids MAY BE PRESENT.   A presumptive positive result was obtained on the submitted specimen  and confirmed on repeat testing.  While 2019 novel coronavirus  (SARS-CoV-2) nucleic acids may be present in the submitted sample  additional confirmatory testing may be necessary for epidemiological  and / or clinical management purposes  to differentiate between  SARS-CoV-2 and other Sarbecovirus currently known to infect humans.  If clinically indicated additional testing with an alternate test  methodology [phone]) is advised. The SARS-CoV-2 RNA is generally  detectable in upper and lower respiratory sp ecimens during the acute  phase of infection. The  expected result is Negative. Fact Sheet for Patients:  StrictlyIdeas.no Fact Sheet for Healthcare Providers: BankingDealers.co.za This test is not yet approved or cleared by the [place] FDA and has been authorized for detection and/or diagnosis of SARS-CoV-2 by FDA under an Emergency Use Authorization (EUA).  This EUA will remain in effect (meaning this test can be used) for the duration of the COVID-19 declaration under Section 564(b)(1) of the Act, 21 U.S.C. section 360bbb-3(b)(1), unless the authorization is terminated or revoked sooner. Performed at [facility], [address] [place], [place] [address]   MRSA PCR Screening     Status: None   Collection Time: 05/07/19 11:17 PM  Result Value Ref Range Status   MRSA by PCR NEGATIVE NEGATIVE Final  Comment:        The GeneXpert MRSA Assay (FDA approved for NASAL specimens only), is one component of a comprehensive MRSA colonization surveillance program. It is not intended to diagnose MRSA infection nor to guide or monitor treatment for MRSA infections. Performed at [facility], [address], [place], [place] [address]     RADIOLOGY:  No results found.   Management plans discussed with the patient, family and they are in agreement.  CODE STATUS: Full Code   TOTAL TIME TAKING CARE OF THIS PATIENT: 39 minutes.    Matthew Mathis M.D on 05/09/2019 at 1:05 PM  Between 7am to 6pm - Pager - [phone]  After 6pm go to [email or web address] - Proofreader  [facility]  Office  [phone]  CC: Primary care physician; Alexis Goodell, MD   Note: This dictation was prepared with Dragon dictation along with smaller phrase technology. Any transcriptional errors that result from this process are unintentional.

## 2019-05-13 ENCOUNTER — Ambulatory Visit: Admit: 2019-05-13 | Discharge: 2019-05-14 | Payer: MEDICARE

## 2019-05-13 DIAGNOSIS — E041 Nontoxic single thyroid nodule: Principal | ICD-10-CM

## 2019-05-14 ENCOUNTER — Telehealth: Payer: Self-pay | Admitting: Cardiovascular Disease

## 2019-05-14 NOTE — Telephone Encounter (Signed)

## 2019-05-18 ENCOUNTER — Encounter: Admit: 2019-05-18 | Discharge: 2019-05-18 | Payer: MEDICARE | Attending: Nephrology | Primary: Nephrology

## 2019-05-18 DIAGNOSIS — Z01818 Encounter for other preprocedural examination: Principal | ICD-10-CM

## 2019-05-20 NOTE — Progress Notes (Signed)
Virtual Visit via Video Note   This visit type was conducted due to national recommendations for restrictions regarding the COVID-19 Pandemic (e.g. social distancing) in an effort to limit this patient's exposure and mitigate transmission in our community.  Due to his co-morbid illnesses, this patient is at least at moderate risk for complications without adequate follow up.  This format is felt to be most appropriate for this patient at this time.  All issues noted in this document were discussed and addressed.  A limited physical exam was performed with this format.  Please refer to the patient's chart for his consent to telehealth for [facility].   I connected with  Matthew Mathis on 05/21/19 by a video enabled telemedicine application and verified that I am speaking with the correct person using two identifiers. I discussed the limitations of evaluation and management by telemedicine. The patient expressed understanding and agreed to proceed.   Evaluation Performed:  Follow-up visit  Date:  05/21/2019   ID:  Matthew Mathis, DOB 21-Aug-1961, MRN [id number]  Patient Location:  [place] [address]   Provider location:   [facility], Central office  PCP:  Alexis Goodell, MD  Cardiologist:  Patsy Baltimore   Chief Complaint: Hospital follow-up A. fib    History of Present Illness:    Matthew Mathis is a 58 y.o. male who presents via audio/video conferencing for a telehealth visit today.   The patient does not symptoms concerning for COVID-19 infection (fever, chills, cough, or new SHORTNESS OF BREATH).   Patient has a past medical history of End-stage renal disease, on HD on Tu/thur/sat Hypertension, and hypothyroidism,  admitted with new atrial fibrillation June 2020, which terminated with IV diltiazem. He presents today for follow-up in the [facility] office for his atrial fibrillation  While in atrial fibrillation was asymptomatic.   takes  metoprolol at home, 50 mg daily.  Increased up to 100 daily   CHADSVASC score is 1 (hypertension).    Since he has been home denies any significant tachycardia or palpitations concerning for atrial fibrillation Denies having shortness of breath, leg edema Feels his dry weight is adequate Takes Lasix on nondialysis days Denies hypotension at the end of dialysis  End-stage renal disease on hemodialysis managed by Dr. Holley Raring per the patient  Recent results obtained from hospitalization discussed with him including echocardiogram as detailed below  Prior CV studies:   The following studies were reviewed today:  Echocardiogram May 08, 2019   1. The left ventricle has low normal systolic function, with an ejection fraction of 50-55%. The cavity size was normal. There is mildly increased left ventricular wall thickness. Left ventricular diastolic parameters were normal.  2. The right ventricle has normal systolic function. The cavity was normal. There is no increase in right ventricular wall thickness.  3. Left atrial size was mildly dilated.  4. The mitral valve is grossly normal. Mitral valve regurgitation is moderate by color flow Doppler. In select views, regurgitation is more mild to moderate. The MR jet is eccentric posteriorly directed.   Past Medical History:  Diagnosis Date  . Chronic renal failure    hx   . Gout    hx  . HTN (hypertension)    Past Surgical History:  Procedure Laterality Date  . CAPD INSERTION Right 05/24/2017   Procedure: LAPAROSCOPIC INSERTION CONTINUOUS AMBULATORY PERITONEAL DIALYSIS  (CAPD) CATHETER;  Surgeon: Katha Cabal, MD;  Location: [facility] ORS;  Service:  Vascular;  Laterality: Right;  . DIALYSIS/PERMA CATHETER INSERTION N/A 05/07/2018   Procedure: DIALYSIS/PERMA CATHETER INSERTION;  Surgeon: Algernon Huxley, MD;  Location: [place] CV [facility];  Service: Cardiovascular;  Laterality: N/A;  . DIALYSIS/PERMA CATHETER REMOVAL N/A 06/27/2017   Procedure:  Dialysis/Perma Catheter Removal;  Surgeon: Katha Cabal, MD;  Location: [place] [facility];  Service: Cardiovascular;  Laterality: N/A;  . DIALYSIS/PERMA CATHETER REMOVAL N/A 07/18/2018   Procedure: DIALYSIS/PERMA CATHETER REMOVAL;  Surgeon: Katha Cabal, MD;  Location: [place] [facility];  Service: Cardiovascular;  Laterality: N/A;  . INSERTION OF DIALYSIS CATHETER Right 05/24/2017   Procedure: INSERTION OF DIALYSIS CATHETER ( TUNNELED CATH );  Surgeon: Katha Cabal, MD;  Location: [facility] ORS;  Service: Vascular;  Laterality: Right;  . PERITONEAL CATHETER INSERTION    . REMOVAL OF A DIALYSIS CATHETER Left 05/24/2017   Procedure: REMOVAL OF A DIALYSIS CATHETER ( PD CATH );  Surgeon: Katha Cabal, MD;  Location: [facility] ORS;  Service: Vascular;  Laterality: Left;  . RENAL BIOPSY     non specific      No outpatient medications have been marked as taking for the 05/21/19 encounter (Telemedicine) with Minna Merritts, MD.     Allergies:   Patient has no known allergies.   Social History   Tobacco Use  . Smoking status: Former Smoker    Packs/day: 0.50    Types: Cigarettes    Quit date: 12/04/2010    Years since quitting: 8.4  . Smokeless tobacco: Never Used  . Tobacco comment: used to smoke 2-3 ppd, now smokes 3 cigarettes daily   Substance Use Topics  . Alcohol use: No  . Drug use: No     Current Outpatient Medications on File Prior to Visit  Medication Sig Dispense Refill  . allopurinol (ZYLOPRIM) 100 MG tablet Take 100 mg by mouth every evening.     [name] amLODipine (NORVASC) 5 MG tablet Take 5 mg by mouth daily.    [name] aspirin EC 81 MG tablet Take 1 tablet (81 mg total) by mouth daily. 30 tablet 0  . calcium acetate (PHOSLO) 667 MG capsule Take 667-1,334 mg by mouth See admin instructions. Take 3 capsules (1334mg ) by mouth three times a day with meals and take 1 capsule (667mg ) by mouth with snacks - total 8 capsules daily    . cinacalcet (SENSIPAR) 60 MG tablet  Take 60 mg by mouth daily.     . furosemide (LASIX) 80 MG tablet Take 80 mg by mouth See admin instructions. Take 1 tablet (80mg ) by mouth daily every Monday, Wednesday, Friday and Sunday    . hydrALAZINE (APRESOLINE) 25 MG tablet Take 25 mg by mouth 3 (three) times daily.    [name] ketoconazole (NIZORAL) 200 MG tablet Take 400 mg by mouth daily.    [name] losartan (COZAAR) 100 MG tablet Take 100 mg by mouth daily.    . metoprolol succinate (TOPROL-XL) 100 MG 24 hr tablet Take 1 tablet (100 mg total) by mouth daily. Take with or immediately following a meal. 30 tablet 0  . Multiple Vitamins-Minerals (DIALYVITE 800/ULTRA D) TABS Take 1 tablet by mouth daily.  3  . omeprazole (PRILOSEC) 20 MG capsule Take 20 mg by mouth daily.    . predniSONE (DELTASONE) 20 MG tablet Take 20 mg by mouth daily as needed.    . pregabalin (LYRICA) 25 MG capsule Take 1 capsule (25 mg total) by mouth daily. (Patient not taking: Reported on 05/07/2019)  90 capsule 2  . Skin Protectants, Misc. (EUCERIN) cream Apply 1 application topically as needed for dry skin.     No current facility-administered medications on file prior to visit.      Family Hx: The patient's family history includes CAD in his father; Diabetes in his father; Hypertension in his mother; Kidney disease in his mother.  ROS:   Please see the history of present illness.    Review of Systems  Constitutional: Negative.   HENT: Negative.   Respiratory: Negative.   Cardiovascular: Negative.   Gastrointestinal: Negative.   Musculoskeletal: Negative.   Neurological: Negative.   Psychiatric/Behavioral: Negative.   All other systems reviewed and are negative.    Labs/Other Tests and Data Reviewed:    Recent Labs: 05/07/2019: TSH 0.487 05/08/2019: ALT 23 05/09/2019: BUN 59; Creatinine, Ser 8.46; Hemoglobin 9.1; Magnesium 2.1; Platelets 178; Potassium 5.2; Sodium 138   Recent Lipid Panel No results found for: CHOL, TRIG, HDL, CHOLHDL, LDLCALC, LDLDIRECT  Wt  Readings from Last 3 Encounters:  05/09/19 145 lb 9.6 oz (66 kg)  12/14/18 154 lb (69.9 kg)  11/16/18 158 lb 11.7 oz (72 kg)     Exam:    Vital Signs: Vital signs may also be detailed in the HPI There were no vitals taken for this visit.  Wt Readings from Last 3 Encounters:  05/09/19 145 lb 9.6 oz (66 kg)  12/14/18 154 lb (69.9 kg)  11/16/18 158 lb 11.7 oz (72 kg)   Temp Readings from Last 3 Encounters:  05/09/19 97.6 F (36.4 C) (Oral)  11/16/18 98.4 F (36.9 C) (Oral)  10/20/18 97.6 F (36.4 C) (Oral)   BP Readings from Last 3 Encounters:  05/09/19 (!) 154/92  12/14/18 (!) 160/89  11/16/18 137/84   Pulse Readings from Last 3 Encounters:  05/09/19 63  12/14/18 67  11/16/18 75    132/70, pulse 60 resp 16  Well nourished, well developed male in no acute distress. Constitutional:  oriented to person, place, and time. No distress.    ASSESSMENT & PLAN:    Atrial fibrillation with rapid ventricular response (HCC) - Low chads VASC, not on anticoagulation Will continue to monitor closely, rhythm control with metoprolol Currently reports he feels at his baseline  HYPERTENSION, BENIGN - Blood pressure is well controlled on today's visit. No changes made to the medications.  ESRD (end stage renal disease) ([place]) - Managed by Dr. Holley Raring Hemodialysis Tuesday Thursday Saturday Also on Lasix on in between days He is moderating his fluid intake   COVID-19 Education: The signs and symptoms of COVID-19 were discussed with the patient and how to seek care for testing (follow up with PCP or arrange E-visit).  The importance of social distancing was discussed today.  Patient Risk:   After full review of this patients clinical status, I feel that they are at least moderate risk at this time.  Time:   Today, I have spent 25 minutes with the patient with telehealth technology discussing the cardiac and medical problems/diagnoses detailed above   10 min spent reviewing the  chart prior to patient visit today   Medication Adjustments/Labs and Tests Ordered: Current medicines are reviewed at length with the patient today.  Concerns regarding medicines are outlined above.   Tests Ordered: No tests ordered   Medication Changes: No changes made   Disposition: Follow-up in 12 months   Signed, Ida Rogue, MD  05/21/2019 11:47 AM    [place] [phone]  [address], [place], [place] [address]

## 2019-05-21 ENCOUNTER — Telehealth (INDEPENDENT_AMBULATORY_CARE_PROVIDER_SITE_OTHER): Payer: Medicare Other | Admitting: Cardiovascular Disease

## 2019-05-21 ENCOUNTER — Other Ambulatory Visit: Payer: Self-pay

## 2019-05-21 DIAGNOSIS — N186 End stage renal disease: Secondary | ICD-10-CM | POA: Diagnosis not present

## 2019-05-21 DIAGNOSIS — I4891 Unspecified atrial fibrillation: Secondary | ICD-10-CM | POA: Diagnosis not present

## 2019-05-21 DIAGNOSIS — I10 Essential (primary) hypertension: Secondary | ICD-10-CM

## 2019-05-21 NOTE — Patient Instructions (Addendum)

## 2019-05-22 ENCOUNTER — Encounter: Admit: 2019-05-22 | Discharge: 2019-05-22 | Payer: MEDICARE | Attending: Surgery | Primary: Surgery

## 2019-05-22 DIAGNOSIS — Z7682 Awaiting organ transplant status: Principal | ICD-10-CM

## 2019-05-23 ENCOUNTER — Encounter
Admit: 2019-05-23 | Discharge: 2019-05-27 | Disposition: A | Discharge status: Home / Self Care | Payer: MEDICARE | Attending: Anesthesiology | Admitting: Student in an Organized Health Care Education/Training Program

## 2019-05-23 ENCOUNTER — Encounter: Admit: 2019-05-23 | Discharge: 2019-05-23 | Payer: MEDICARE | Attending: Nephrology | Primary: Nephrology

## 2019-05-23 ENCOUNTER — Ambulatory Visit
Admit: 2019-05-23 | Discharge: 2019-05-27 | Disposition: A | Discharge status: Home / Self Care | Payer: MEDICARE | Admitting: Student in an Organized Health Care Education/Training Program

## 2019-05-23 DIAGNOSIS — Z940 Kidney transplant status: Principal | ICD-10-CM

## 2019-05-24 DIAGNOSIS — Z940 Kidney transplant status: Principal | ICD-10-CM

## 2019-05-27 MED ORDER — POLYETHYLENE GLYCOL 3350 17 GRAM ORAL POWDER PACKET
Freq: Every day | ORAL | 0 refills | 0 days | Status: CP | PRN
Start: 2019-05-27 — End: 2019-06-17
  Filled 2019-05-27: qty 30, 30d supply, fill #0

## 2019-05-27 MED ORDER — TRAMADOL 50 MG TABLET
ORAL_TABLET | Freq: Two times a day (BID) | ORAL | 0 refills | 0.00000 days | Status: CP | PRN
Start: 2019-05-27 — End: 2019-06-17
  Filled 2019-05-27: qty 28, 7d supply, fill #0

## 2019-05-27 MED ORDER — MYFORTIC 180 MG TABLET,DELAYED RELEASE
ORAL_TABLET | Freq: Two times a day (BID) | ORAL | 11 refills | 0 days | Status: CP
Start: 2019-05-27 — End: 2019-05-27
  Filled 2019-05-27: qty 180, 30d supply, fill #0

## 2019-05-27 MED ORDER — DOCUSATE SODIUM 100 MG CAPSULE
ORAL_CAPSULE | Freq: Two times a day (BID) | ORAL | 0 refills | 0 days | Status: CP | PRN
Start: 2019-05-27 — End: 2019-07-02
  Filled 2019-05-27: qty 60, 30d supply, fill #0

## 2019-05-27 MED ORDER — PROGRAF 1 MG CAPSULE
ORAL_CAPSULE | Freq: Two times a day (BID) | ORAL | 11 refills | 0.00000 days | Status: CP
Start: 2019-05-27 — End: 2019-05-29
  Filled 2019-05-27: qty 240, 30d supply, fill #0

## 2019-05-27 MED ORDER — MG-PLUS-PROTEIN 133 MG TABLET
ORAL_TABLET | Freq: Two times a day (BID) | ORAL | 11 refills | 0 days | Status: CP
Start: 2019-05-27 — End: 2019-06-03
  Filled 2019-05-27: qty 100, 50d supply, fill #0

## 2019-05-27 MED ORDER — ACETAMINOPHEN 500 MG TABLET
ORAL_TABLET | Freq: Four times a day (QID) | ORAL | 0 refills | 0 days | Status: CP | PRN
Start: 2019-05-27 — End: ?
  Filled 2019-05-27: qty 100, 13d supply, fill #0

## 2019-05-27 MED ORDER — GABAPENTIN 100 MG CAPSULE
ORAL_CAPSULE | Freq: Every evening | ORAL | 0 refills | 0 days | Status: CP
Start: 2019-05-27 — End: 2019-06-17
  Filled 2019-05-27: qty 11, 11d supply, fill #0

## 2019-05-27 MED ORDER — METOPROLOL TARTRATE 25 MG TABLET
ORAL_TABLET | Freq: Two times a day (BID) | ORAL | 11 refills | 30.00000 days | Status: CP
Start: 2019-05-27 — End: 2019-08-06
  Filled 2019-05-27: qty 60, 30d supply, fill #0

## 2019-05-27 MED ORDER — VALGANCICLOVIR 450 MG TABLET
ORAL_TABLET | Freq: Every day | ORAL | 2 refills | 30 days | Status: CP
Start: 2019-05-27 — End: 2019-08-25
  Filled 2019-05-27: qty 30, 30d supply, fill #0

## 2019-05-27 MED ORDER — LINEZOLID 600 MG TABLET
ORAL_TABLET | Freq: Two times a day (BID) | ORAL | 0 refills | 0.00000 days | Status: CP
Start: 2019-05-27 — End: 2019-06-17
  Filled 2019-05-27: qty 22, 11d supply, fill #0

## 2019-05-27 MED ORDER — AMLODIPINE 10 MG TABLET
ORAL_TABLET | Freq: Every day | ORAL | 11 refills | 0.00000 days | Status: CP
Start: 2019-05-27 — End: 2019-06-03

## 2019-05-27 MED ORDER — PROGRAF 1 MG CAPSULE: 4 mg | capsule | Freq: Two times a day (BID) | 11 refills | 0 days | Status: AC

## 2019-05-27 MED ORDER — SULFAMETHOXAZOLE 400 MG-TRIMETHOPRIM 80 MG TABLET
ORAL_TABLET | ORAL | 5 refills | 28.00000 days | Status: CP
Start: 2019-05-27 — End: 2019-07-04
  Filled 2019-05-27: qty 12, 28d supply, fill #0

## 2019-05-27 MED ORDER — MYFORTIC 180 MG TABLET,DELAYED RELEASE: 540 mg | tablet | Freq: Two times a day (BID) | 11 refills | 30 days | Status: AC

## 2019-05-27 MED ORDER — GABAPENTIN 100 MG PO CAPS
100.00 | ORAL_CAPSULE | ORAL | Status: DC
Start: 2019-05-27 — End: 2019-05-27

## 2019-05-27 MED ORDER — HYDRALAZINE HCL 20 MG/ML IJ SOLN
10.00 | INTRAMUSCULAR | Status: DC
Start: ? — End: 2019-05-27

## 2019-05-27 MED ORDER — INSULIN REGULAR HUMAN 100 UNIT/ML IJ SOLN
0.00 | INTRAMUSCULAR | Status: DC
Start: 2019-05-27 — End: 2019-05-27

## 2019-05-27 MED ORDER — HEPARIN SODIUM (PORCINE) 5000 UNIT/ML IJ SOLN
5000.00 | INTRAMUSCULAR | Status: DC
Start: 2019-05-27 — End: 2019-05-27

## 2019-05-27 MED ORDER — MYCOPHENOLATE MOFETIL 250 MG PO CAPS
750.00 | ORAL_CAPSULE | ORAL | Status: DC
Start: 2019-05-27 — End: 2019-05-27

## 2019-05-27 MED ORDER — NYSTATIN 100000 UNIT/ML MT SUSP
10.00 | OROMUCOSAL | Status: DC
Start: 2019-05-27 — End: 2019-05-27

## 2019-05-27 MED ORDER — VALGANCICLOVIR HCL 450 MG PO TABS
450.00 | ORAL_TABLET | ORAL | Status: DC
Start: 2019-05-28 — End: 2019-05-27

## 2019-05-27 MED ORDER — METOPROLOL TARTRATE 25 MG PO TABS
25.00 | ORAL_TABLET | ORAL | Status: DC
Start: 2019-05-27 — End: 2019-05-27

## 2019-05-27 MED ORDER — GENERIC EXTERNAL MEDICATION
8.00 | Status: DC
Start: 2019-05-27 — End: 2019-05-27

## 2019-05-27 MED ORDER — POLYETHYLENE GLYCOL 3350 17 G PO PACK
17.00 | PACK | ORAL | Status: DC
Start: ? — End: 2019-05-27

## 2019-05-27 MED ORDER — DEXTROSE 50 % IV SOLN
12.50 | INTRAVENOUS | Status: DC
Start: ? — End: 2019-05-27

## 2019-05-27 MED ORDER — SULFAMETHOXAZOLE-TRIMETHOPRIM 400-80 MG PO TABS
1.00 | ORAL_TABLET | ORAL | Status: DC
Start: 2019-05-29 — End: 2019-05-27

## 2019-05-27 MED ORDER — ACETAMINOPHEN 500 MG PO TABS
1000.00 | ORAL_TABLET | ORAL | Status: DC
Start: 2019-05-27 — End: 2019-05-27

## 2019-05-27 MED ORDER — LABETALOL HCL 5 MG/ML IV SOLN
5.00 | INTRAVENOUS | Status: DC
Start: ? — End: 2019-05-27

## 2019-05-27 MED ORDER — DOCUSATE SODIUM 100 MG PO CAPS
100.00 | ORAL_CAPSULE | ORAL | Status: DC
Start: 2019-05-27 — End: 2019-05-27

## 2019-05-27 MED ORDER — TACROLIMUS 1 MG PO CAPS
4.00 | ORAL_CAPSULE | ORAL | Status: DC
Start: 2019-05-27 — End: 2019-05-27

## 2019-05-27 MED ORDER — AMLODIPINE BESYLATE 10 MG PO TABS
10.00 | ORAL_TABLET | ORAL | Status: DC
Start: 2019-05-28 — End: 2019-05-27

## 2019-05-27 MED ORDER — FAMOTIDINE 20 MG PO TABS
20.00 | ORAL_TABLET | ORAL | Status: DC
Start: 2019-05-28 — End: 2019-05-27

## 2019-05-27 MED ORDER — TRAMADOL HCL 50 MG PO TABS
50.00 | ORAL_TABLET | ORAL | Status: DC
Start: ? — End: 2019-05-27

## 2019-05-27 MED ORDER — ONDANSETRON HCL 4 MG/2ML IJ SOLN
4.00 | INTRAMUSCULAR | Status: DC
Start: ? — End: 2019-05-27

## 2019-05-27 MED FILL — SULFAMETHOXAZOLE 400 MG-TRIMETHOPRIM 80 MG TABLET: 28 days supply | Qty: 12 | Fill #0 | Status: AC

## 2019-05-27 MED FILL — ACETAMINOPHEN 500 MG TABLET: 13 days supply | Qty: 100 | Fill #0 | Status: AC

## 2019-05-27 MED FILL — PROGRAF 1 MG CAPSULE: 30 days supply | Qty: 240 | Fill #0 | Status: AC

## 2019-05-27 MED FILL — DOK 100 MG CAPSULE: 30 days supply | Qty: 60 | Fill #0 | Status: AC

## 2019-05-27 MED FILL — MYFORTIC 180 MG TABLET,DELAYED RELEASE: 30 days supply | Qty: 180 | Fill #0 | Status: AC

## 2019-05-27 MED FILL — VALGANCICLOVIR 450 MG TABLET: 30 days supply | Qty: 30 | Fill #0 | Status: AC

## 2019-05-27 MED FILL — LINEZOLID 600 MG TABLET: 11 days supply | Qty: 22 | Fill #0 | Status: AC

## 2019-05-27 MED FILL — METOPROLOL TARTRATE 25 MG TABLET: 30 days supply | Qty: 60 | Fill #0 | Status: AC

## 2019-05-27 MED FILL — GABAPENTIN 100 MG CAPSULE: 11 days supply | Qty: 11 | Fill #0 | Status: AC

## 2019-05-27 MED FILL — TRAMADOL 50 MG TABLET: 7 days supply | Qty: 28 | Fill #0 | Status: AC

## 2019-05-27 MED FILL — MG-PLUS-PROTEIN 133 MG TABLET: 50 days supply | Qty: 100 | Fill #0 | Status: AC

## 2019-05-27 MED FILL — POLYETHYLENE GLYCOL 3350 17 GRAM ORAL POWDER PACKET: 30 days supply | Qty: 30 | Fill #0 | Status: AC

## 2019-05-28 NOTE — Unmapped (Addendum)
Returned Nurse, adult. 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 Patient states he has not emptied JP drain since being home. Patient denies redness, tenderness or swelling around the insertion site. Instructed patient to pack gauze around incision site, empty the drain and record the amount then squeeze the bulb with his hands and close the opening. Patient states he is in the car and heading to the ER. Informed patient that it is not necessary to come to the hospital at this time and that he is exposing himself to more infection by having to sit in the ER.  Patient verbalized understanding. Patients states he will pack gauze around his insertion site and empty the drain when he gets home. Made patient aware that I will contact his primary coordinator who will contact him in the morning to further assess.

## 2019-05-28 NOTE — Unmapped (Signed)
[facility] Pharmacist Intervention    Type of intervention: patient enrollment    Medication: all medications    Problem: received notification that patient will fill medications at [facility] post- discharge    Intervention: patient enrolled in specialty calls and onboarding/ welcome call set up for this week or next pending discharge    Follow up needed: see above    Approximate time spent: 10 minutes    Thad Ranger   [facility] Specialty Pharmacist

## 2019-05-28 NOTE — Unmapped (Signed)
Received message from pt son and from on-call coordinator:     Pt had bloody drainage around JP drain last night, was advised to pack guaze around insertion site and monitor.     Called pt this morning using [employer] [phone]) and spoke with his wife. She states she changed the dressing and the drainage was dry and no additional drainage noted. Pt wife had no other questions at this time, pt was still sleeping. TNC will call back this afternoon to follow up and see how their day was going. Pt wife was amenable.

## 2019-05-29 ENCOUNTER — Ambulatory Visit: Admit: 2019-05-29 | Discharge: 2019-05-30 | Payer: MEDICARE

## 2019-05-29 DIAGNOSIS — Z01818 Encounter for other preprocedural examination: Secondary | ICD-10-CM

## 2019-05-29 DIAGNOSIS — Z940 Kidney transplant status: Principal | ICD-10-CM

## 2019-05-29 DIAGNOSIS — Z7682 Awaiting organ transplant status: Principal | ICD-10-CM

## 2019-05-29 DIAGNOSIS — N186 End stage renal disease: Secondary | ICD-10-CM

## 2019-05-29 DIAGNOSIS — D899 Disorder involving the immune mechanism, unspecified: Secondary | ICD-10-CM

## 2019-05-29 LAB — RENAL FUNCTION PANEL
ALBUMIN: 4.2 g/dL (ref 3.5–5.0)
ANION GAP: 10 mmol/L (ref 7–15)
BLOOD UREA NITROGEN: 21 mg/dL (ref 7–21)
BUN / CREAT RATIO: 25
CHLORIDE: 104 mmol/L (ref 98–107)
CO2: 22 mmol/L (ref 22.0–30.0)
CREATININE: 0.84 mg/dL (ref 0.70–1.30)
EGFR CKD-EPI AA MALE: >90 mL/min/{1.73_m2} (ref >=60)
EGFR CKD-EPI NON-AA MALE: >90 mL/min/{1.73_m2} (ref >=60)
GLUCOSE RANDOM: 146 mg/dL (ref 70–179)
PHOSPHORUS: 2.2 mg/dL — ABNORMAL LOW (ref 2.9–4.7)
POTASSIUM: 5.5 mmol/L — ABNORMAL HIGH (ref 3.5–5.0)
SODIUM: 136 mmol/L (ref 135–145)

## 2019-05-29 LAB — CBC W/ AUTO DIFF
BASOPHILS ABSOLUTE COUNT: 0 10*9/L (ref 0.0–0.1)
BASOPHILS RELATIVE PERCENT: 0 %
EOSINOPHILS ABSOLUTE COUNT: 0.1 10*9/L (ref 0.0–0.4)
EOSINOPHILS RELATIVE PERCENT: 1.2 %
HEMOGLOBIN: 10.1 g/dL — ABNORMAL LOW (ref 13.5–17.5)
LARGE UNSTAINED CELLS: 0 % (ref 0–4)
LYMPHOCYTES ABSOLUTE COUNT: 0.1 10*9/L — ABNORMAL LOW (ref 1.5–5.0)
LYMPHOCYTES RELATIVE PERCENT: 0.8 %
MEAN CORPUSCULAR HEMOGLOBIN CONC: 32.7 g/dL (ref 31.0–37.0)
MEAN CORPUSCULAR HEMOGLOBIN: 31.2 pg (ref 26.0–34.0)
MEAN CORPUSCULAR VOLUME: 95.4 fL (ref 80.0–100.0)
MEAN PLATELET VOLUME: 8.2 fL (ref 7.0–10.0)
MONOCYTES ABSOLUTE COUNT: 0.1 10*9/L — ABNORMAL LOW (ref 0.2–0.8)
MONOCYTES RELATIVE PERCENT: 1.6 %
NEUTROPHILS ABSOLUTE COUNT: 8.5 10*9/L — ABNORMAL HIGH (ref 2.0–7.5)
NEUTROPHILS RELATIVE PERCENT: 96 %
RED BLOOD CELL COUNT: 3.23 10*12/L — ABNORMAL LOW (ref 4.50–5.90)
RED CELL DISTRIBUTION WIDTH: 17.9 % — ABNORMAL HIGH (ref 12.0–15.0)
WBC ADJUSTED: 8.9 10*9/L (ref 4.5–11.0)

## 2019-05-29 LAB — HLA ANTIBODY SCR

## 2019-05-29 LAB — MAGNESIUM: Magnesium:MCnc:Pt:Ser/Plas:Qn:: 1.4 — ABNORMAL LOW

## 2019-05-29 LAB — EGFR CKD-EPI AA MALE: Lab: >90

## 2019-05-29 LAB — TACROLIMUS BLOOD: Lab: 10.8

## 2019-05-29 LAB — MONOCYTES ABSOLUTE COUNT: Lab: 0.1 — ABNORMAL LOW

## 2019-05-29 MED ORDER — PROGRAF 1 MG CAPSULE
ORAL_CAPSULE | Freq: Two times a day (BID) | ORAL | 11 refills | 0.00000 days | Status: CP
Start: 2019-05-29 — End: 2019-06-14

## 2019-05-29 MED ORDER — CINACALCET 60 MG TABLET
ORAL_TABLET | Freq: Every day | ORAL | 11 refills | 0 days | Status: CP
Start: 2019-05-29 — End: 2019-06-03

## 2019-05-29 NOTE — Unmapped (Signed)
Called pt using [employer] [phone]). Reviewed tac level with L. Mincemoyer CPP, decreasing Prograf to 4mg  BID. Pt taking 5mg  BID since discharge. Pt made aware, verablized understanding. Updated prescription sent to [facility].

## 2019-05-29 NOTE — Unmapped (Signed)
Transplant Coordinator, Post Discharge Follow-up    Call to patient today to check on status since discharge using [place] [phone]). Pt added medications to his pill box, will start Sensipar 60mg  daily, [facility] [place] [facility] elevated Calcium. Wt decreased, will monitor. Pt had BM, advised to hold Colace if stools become loose. Reviewed upcoming surgery appt on 6/29 at 8am, he will get labs and then come up to [facility]. Reviewed when and who to call if pt has any issues with medications or anything.   Do you have all of your medications: yes  Are you following your orange card: yes  BP: 80/50 before labs this am, 80/50 after took medication this am, BP came up to 127/73 by lunch time, stopping Amlodipine, pt will also hold Metoprolol if SBP<100, DBP<55, pt to call if BP> 160/100 or < 100/60   Do you have a BP cuff, scale and thermometer? yes  Fevers: no  Chills/Sweats: no  Swelling: no  Nausea/Vomiting: no  Diarrhea/Constipation: no   Appetite: appetite is down due to fluid intake, encouraged pt to eat small meals more often  Fluid intake: 2,038ml water   Urine output: 2,05ml  Foley: removed in hospital  UTI: no  Incision: C/D/I, staples remain intact   JP Drain: leaking around insertion site, light orange in color, output 6/23 79ml, today 40ml  Pain: 3-4/10 when moving, took Tramadol x1 with moderate effect, advised pt he could also take Tylenol 1000mg  up to 3 x per day  Any issues getting labs drawn: no, going to [facility]

## 2019-05-30 LAB — HLA ANTIBODY SCREEN, FLOW CYTOMETRY, CLASS I AND II: HLA C1 AB SCR: NEGATIVE

## 2019-05-30 LAB — HLA CL1 ANTIBODY COMM: Lab: 0

## 2019-05-30 NOTE — Unmapped (Signed)
[facility] Special[facility] Medication Referral: No PA required    Medication (Brand/Generic): PROGRAF 1MG (WRITTEN DAW1)    Initial Benefits Investigation Claim completed with resulted information below:  No PA required  Patient ABLE to fill at [facility]  Insurance Company:  MEDICARE PART B  Anticipated Copay: $0    As Co-pay is under $25 defined limit, per policy there will be no further investigation of need for financial assistance at this time unless patient requests. This referral has been communicated to the provider and handed off to the [facility] team for further processing and filling of prescribed medication.   ______________________________________________________________________  Please utilize this referral for viewing purposes as it will serve as the central location for all relevant documentation and updates.

## 2019-05-31 ENCOUNTER — Ambulatory Visit: Admit: 2019-05-31 | Discharge: 2019-06-01 | Payer: MEDICARE

## 2019-05-31 DIAGNOSIS — Z940 Kidney transplant status: Secondary | ICD-10-CM

## 2019-05-31 DIAGNOSIS — E0590 Thyrotoxicosis, unspecified without thyrotoxic crisis or storm: Principal | ICD-10-CM

## 2019-05-31 DIAGNOSIS — D899 Disorder involving the immune mechanism, unspecified: Secondary | ICD-10-CM

## 2019-05-31 LAB — CBC W/ AUTO DIFF
BASOPHILS ABSOLUTE COUNT: 0 10*9/L (ref 0.0–0.1)
BASOPHILS RELATIVE PERCENT: 0 %
EOSINOPHILS ABSOLUTE COUNT: 0 10*9/L (ref 0.0–0.4)
HEMATOCRIT: 35.6 % — ABNORMAL LOW (ref 41.0–53.0)
HEMOGLOBIN: 11.3 g/dL — ABNORMAL LOW (ref 13.5–17.5)
LARGE UNSTAINED CELLS: 0 % (ref 0–4)
LYMPHOCYTES ABSOLUTE COUNT: 0 10*9/L — ABNORMAL LOW (ref 1.5–5.0)
LYMPHOCYTES RELATIVE PERCENT: 0.4 %
MEAN CORPUSCULAR HEMOGLOBIN CONC: 31.7 g/dL (ref 31.0–37.0)
MEAN CORPUSCULAR HEMOGLOBIN: 30.6 pg (ref 26.0–34.0)
MEAN CORPUSCULAR VOLUME: 96.5 fL (ref 80.0–100.0)
MONOCYTES ABSOLUTE COUNT: 0.1 10*9/L — ABNORMAL LOW (ref 0.2–0.8)
MONOCYTES RELATIVE PERCENT: 1 %
NEUTROPHILS ABSOLUTE COUNT: 9.5 10*9/L — ABNORMAL HIGH (ref 2.0–7.5)
NEUTROPHILS RELATIVE PERCENT: 98 %
PLATELET COUNT: 293 10*9/L (ref 150–440)
RED BLOOD CELL COUNT: 3.69 10*12/L — ABNORMAL LOW (ref 4.50–5.90)
RED CELL DISTRIBUTION WIDTH: 18.9 % — ABNORMAL HIGH (ref 12.0–15.0)
WBC ADJUSTED: 9.7 10*9/L (ref 4.5–11.0)

## 2019-05-31 LAB — RENAL FUNCTION PANEL
ALBUMIN: 4.7 g/dL (ref 3.5–5.0)
ANION GAP: 10 mmol/L (ref 7–15)
BLOOD UREA NITROGEN: 21 mg/dL (ref 7–21)
BUN / CREAT RATIO: 25
CHLORIDE: 103 mmol/L (ref 98–107)
CO2: 21 mmol/L — ABNORMAL LOW (ref 22.0–30.0)
CREATININE: 0.83 mg/dL (ref 0.70–1.30)
EGFR CKD-EPI AA MALE: >90 mL/min/{1.73_m2} (ref >=60)
EGFR CKD-EPI NON-AA MALE: >90 mL/min/{1.73_m2} (ref >=60)
PHOSPHORUS: 2.3 mg/dL — ABNORMAL LOW (ref 2.9–4.7)
POTASSIUM: 6.2 mmol/L (ref 3.5–5.0)
SODIUM: 134 mmol/L — ABNORMAL LOW (ref 135–145)

## 2019-05-31 LAB — POTASSIUM: Potassium:SCnc:Pt:Ser/Plas:Qn:: 6.2

## 2019-05-31 LAB — MAGNESIUM: Magnesium:MCnc:Pt:Ser/Plas:Qn:: 1.3 — ABNORMAL LOW

## 2019-05-31 LAB — THYROID STIMULATING HORMONE: Thyrotropin:ACnc:Pt:Ser/Plas:Qn:: 0.704

## 2019-05-31 LAB — WBC ADJUSTED: Lab: 9.7

## 2019-05-31 MED ORDER — TACROLIMUS 1 MG CAPSULE
ORAL_CAPSULE | Freq: Two times a day (BID) | ORAL | 11 refills | 0 days | Status: CP
Start: 2019-05-31 — End: 2019-06-03

## 2019-05-31 MED ORDER — SODIUM POLYSTYRENE SULFONATE ORAL POWDER
Freq: Once | ORAL | 1 refills | 0.00000 days | Status: CP
Start: 2019-05-31 — End: 2019-06-03

## 2019-05-31 NOTE — Unmapped (Signed)
Received page, called back and reached son, he had questions regarding Sensipar, made him aware that appeal letter was filed and waiting for response from insurance company. Received call from Dr. Gwynneth Munson, K+ 6.2 and Ca 13, he would like pt to take Kayexalate 15g x 1 dose. [facility] [place] [place]. When speaking with pt son, pt is denying any pain and is not taking Vit D. Per Dr. Gwynneth Munson, ok to monitor Ca for now. Pt son will also review his diet and see if there is anything that he is consuming that could increase his Ca or K+, pt has been drinking a Calcium enriched milk, he will stop this drink. He will also increase fluid intake. Pt made aware using [employer] ([place] # [phone]) and verbalized understanding.

## 2019-05-31 NOTE — Unmapped (Signed)
[facility] Pharm[facility] Pharmacist Intervention    Type of intervention: insurance restrictions part b    Medication: prograf and myfortic    Problem: part b prograf and myfortic filled at [facility]    Intervention: since part b meds cannot be transferred, requesting new rxs today    Follow up needed: see above    Approximate time spent: 10 minutes    Thad Ranger   [facility] Specialty Pharmacist      [facility] Pharmacist Intervention    Type of intervention: patient enrollment    Medication: valganciclovir    Problem: requesting triage to transfer and profile valganciclovir for test claim on day insurance will cover    Intervention: see above    Follow up needed: see above    Approximate time spent: 10 minutes    Thad Ranger   [facility] Specialty Pharmacist    [facility] Pharmacist Intervention    Type of intervention: insurance restrictions    Medication: prograf    Problem: part b brand prograf per protocol requires generic    Intervention: requesting generic rx from clinic    Follow up needed: see above    Approximate time spent: 10 minutes    Thad Ranger   [facility] Specialty Pharmacist

## 2019-05-31 NOTE — Unmapped (Signed)
[facility]   Patient Onboarding/Medication Counseling    Louis Dennis is a 58 y.o. male with kidney transplant who I am counseling today on continuation of therapy.  I am speaking to the patient. Spoke with Mr. Joswick son today about his medications.    Verified patient's date of birth / HIPAA.    Specialty medication(s) to be sent: Transplant: none      Non-specialty medications/supplies to be sent: ASA      Medications not needed at this time: Pt declined needing any other medications at this time.             Valcyte (valganciclovir)    Medication & Administration     Dosage:   ? Take one tablet daily.    Administration:   ? Take with food  ? Swallow the pills whole, do not break, crush, or chew    Adherence/Missed dose instructions:  ? Take a missed dose as soon as you think about it with food  ? If it is close to your next dose, skip the missed dose and go back to your normal time.  ? Do not take 2 doses at the same time or extra doses.  ? Report any missed doses to coordinator    Goals of Therapy     ? To prevent or treat CMV infection in setting of solid organ transplant    Side Effects & Monitoring Parameters     ? Common side effects  ? Headache  ? Diarrhea or constipation  ? Appetite or sleep disturbances  ? Back, muscle, joint, or belly pain  ? Weight loss  ? Dizziness  ? Muscle spasm  ? Upset stomach or vomiting    ? The following side effects should be reported to the provider:  ? Allergic reaction  (rash, hives, swelling, blistered or peeling skin, shortness of breath)  ? Infection (fever, chills, sore throat, ear/sinus pain, cough, sputum change, urinary pain, mouth sores, non-healing wounds)  ? Bleeding (cough ground vomit, blood in urine, black/red/tarry stools, unexplained bruising or bleeding)  ? Electrolyte problems (mood changes, confusion, weakness, abnormal heartbeat, seizures)  ? Kidney problems (urine changes, weight gain)  ? Yellowing skin or eyes  ? Swelling in arms, legs, stomach  ? Severe dizziness or passing out  ? Eye issues (eyesight changes, pain, or irritation)  ? Night sweats    ? Monitoring parameters  ? Have eye exam as directed by doctor  ? CMV counts  ? CBC  ? Renal function  ? Pregnancy test prior to initiation    Contraindications, Warnings, & Precautions     ? BBW: severe leukopenia, neutropenia, anemia, thrombocytopenia, pancytopenia, and bone marrow failure, including aplastic anemia have been reported  ? BBW: may cause temporary or permanent inhibition of spermatogenesis and suppression of fertilty; has the potential to cause birth defects and cancers in humans  ? Male patients should have pregnancy test prior to initiation and use birth control for at least 30 days after discontinuation  ? Male patients should use a barrier contraceptive while on therapy and for 90 days after discontinuation  ? Acute renal failure  ? Not indicated for use in liver transplant recipients  ? Breastfeeding is not recommended    Drug/Food Interactions     ? Medication list reviewed in Epic. The patient was instructed to inform the care team before taking any new medications or supplements. No drug interactions identified.   ? Check with your doctor before  getting any vaccinations (live or inactivated)    Storage, Handling Precautions, & Disposal     ? Store at room temperature  ? Keep away from children and pets      Prograf (tacrolimus)    Medication & Administration     Dosage: Take 4 capsules two times a day     Administration:   ? May take with or without food  ? Take 12 hours apart    Adherence/Missed dose instructions:  ? Take a missed dose as soon as you think about it.  ? If it is close to the time for your next dose, skip the missed dose and go back to your normal time.  ? Do not take 2 doses at the same time or extra doses.    Goals of Therapy     ? To prevent organ rejection    Side Effects & Monitoring Parameters     ? Common side effects  ? Dizziness  ? Fatigue ? Headache  ? Stuffy nose or sore throat  ? Nausea, vomiting, stomach pain, diarrhea, constipation  ? Heartburn  ? Back or joint pain  ? Increased risk of infection    ? The following side effects should be reported to the provider:  ? Allergic reaction  ? Kidney issues (change in quantity or urine passed, blood in urine, or weight gain)  ? High blood pressure (dizziness, change in eyesight, headache)  ? Electrolyte issues (change in mood, confusion, muscle pain, or weakness)  ? Abnormal breathing  ? Shakiness  ? Unexplained bleeding or bruising (gums bleeding, blood in urine, nosebleeds, any abnormal bleeding)  ? Signs of infection  ? Skin changes (sores, paleness, new or changed bumps or moles)    ? Monitoring Parameters  ? Renal function  ? Liver function  ? Glucose levels  ? Blood pressure  ? Tacrolimus trough levels  ? Cardiac monitoring (for QT prolongation)      Contraindications, Warnings, & Precautions     ? Black Box Warning: Infections - immunosuppressant agents increase the risk of infection that may lead to hospitalization or death  ? Black Box Warning: Malignancy - immunosuppressant agents may be associated with the development of malignancies that may lead to hospitalization or death  ? Limit or avoid sun and ultraviolet light exposure, use appropriate sun protection  ? Myocardial hypertrophy -avoid use in patients with congenital long QT syndrome  ? Diabetes mellitus - the risk for new-onset diabetes and insulin-dependent post-transplant diabetes mellitus is increased with tacrolimus use after transplantation  ? GI perforation  ? Hyperkalemia  ? Hypertension  ? Nephrotoxicity  ? Neurotoxicity  ? This is a narrow therapeutic index drug. Do not switch manufacturers without first talking to the provider.    Drug/Food Interactions     ? Medication list reviewed in Epic. The patient was instructed to inform the care team before taking any new medications or supplements. No drug interactions identified. ? Avoid alcohol  ? Avoid grapefruit or grapefruit juice  ? Avoid live vaccines    Storage, Handling Precautions, & Disposal     ? Store at room temperature  ? Keep away from children and pets      Myfortic (mycophenolic acid)    Medication & Administration     Dosage:   ? Take 3 tablets two times a day.    Administration:   ? Take with or without food, although taking with food helps minimize GI side effects.  ? Swallow  the pills whole, do not chew or crush    Adherence/Missed dose instructions:  ? Take a missed dose as soon as you think about it.  ? If it is less than 2 hours until your next dose, skip the missed dose and go back to your normal time.  ? Do not take 2 doses at the same time or extra doses.    Goals of Therapy     ? To prevent organ rejection    Side Effects & Monitoring Parameters     ? Common side effects  ? Back or joint pain  ? Constipation  ? Headache/dizziness  ? Not hungry  ? Stomach pain, diarrhea, constipation, gas, upset stomach, vomiting, nausea  ? Feeling tired or weak  ? Shakiness  ? Trouble sleeping  ? Increased risk of infection    ? The following side effects should be reported to the provider:  ? Allergic reaction  ? High blood sugar (confusion, feeling sleepy, more thirst, more hungry, passing urine more often, flushing, fast breathing, or breath that smells like fruit)  ? Electrolyte issues (mood changes, confusion, muscle pain or weakness, a heartbeat that does not feel normal, seizures, not hungry, or very bad upset stomach or throwing up)  ? High or low blood pressure (bad headache or dizziness, passing out, or change in eyesight)  ? Kidney issues (unable to pass urine, change in how much urine is passed, blood in the urine, or a big weight gain)  ? Skin (oozing, heat, swelling, redness, or pain), UTI and other infections   ? Chest pain or pressure  ? Abnormal heartbeat  ? Unexplained bleeding or bruising  ? Abnormal burning, numbness, or tingling  ? Muscle cramps,  ? Yellowing of skin or eyes    ? Monitoring parameters  ? Pregnancy test initially prior to treatment and 8-10 days later then as needed)  ? CBC weekly for first month then twice monthly for next 2 months, then monthly)  ? Monitor Renal and liver functions  ? Signs of organ rejection    Contraindications, Warnings, & Precautions     ? *This is a REMS drug and an FDA-approved patient medication guide will be printed with each dispensation  ? Black Box Warning: Infections   ? Black Box Warning: Lymphoproliferative disorders - risk of development of lymphoma and skin malignancy is increased  ? Black Box Warning: Use during pregnancy is associated with increased risks of first trimester pregnancy loss and congenital malformations.   ? Black Box Warning: Females of reproductive potential should use contraception during treatment and for 6 weeks after therapy is discontinued  ? CNS depression  ? New or reactivated viral infections  ? Neutropenia  ? Male patients and/or their male partners should use effective contraception during treatment of the male patient and for at least 3 months after last dose.  ? Breastfeeding is not recommended during therapy and for 6 weeks after last dose    Drug/Food Interactions     ? Medication list reviewed in Epic. The patient was instructed to inform the care team before taking any new medications or supplements. No drug interactions identified.   ? Do not take Echinacea while on this medication  ? Check with your doctor before getting any vaccinations (live or inactivated)    Storage, Handling Precautions, & Disposal     ? Store at room temperature  ? Keep away from children and pets  ? This drug is considered hazardous and should  be handled as little as possible.  Wash hands before and after touching pills. If someone else helps with medication administration, they should wear gloves.      Current Medications (including OTC/herbals), Comorbidities and Allergies     Current Outpatient Medications Medication Sig Dispense Refill   ??? acetaminophen (TYLENOL) 500 MG tablet Take 1-2 tablets (500-1,000 mg total) by mouth every six (6) hours as needed for pain. 100 tablet 0   ??? allopurinol (ZYLOPRIM) 100 MG tablet Take 100 mg by mouth daily.     ??? amLODIPine (NORVASC) 10 MG tablet Take 1 tablet (10 mg total) by mouth daily. 30 tablet 11   ??? aspirin (ECOTRIN) 81 MG tablet Take 81 mg by mouth daily.     ??? cinacalcet (SENSIPAR) 60 MG tablet Take 1 tablet (60 mg total) by mouth daily. 30 tablet 11   ??? docusate sodium (COLACE) 100 MG capsule Take 1 capsule (100 mg total) by mouth two (2) times a day as needed for constipation. 60 capsule 0   ??? gabapentin (NEURONTIN) 100 MG capsule Take 1 capsule (100 mg total) by mouth nightly for 11 days. 11 capsule 0   ??? linezolid (ZYVOX) 600 mg tablet Take 1 tablet (600 mg total) by mouth Two (2) times a day for 11 days. 22 tablet 0   ??? magnesium oxide-Mg AA chelate (MAGNESIUM, AMINO ACID CHELATE,) 133 mg Tab Take 1 tablet by mouth Two (2) times a day. HOLD until directed to start by your coordinator. 100 tablet 11   ??? metoprolol tartrate (LOPRESSOR) 25 MG tablet Take 1 tablet (25 mg total) by mouth Two (2) times a day. 60 tablet 11   ??? MYFORTIC 180 mg EC tablet Take 3 tablets (540 mg total) by mouth Two (2) times a day. 180 tablet 11   ??? polyethylene glycol (MIRALAX) 17 gram packet Mix 1 packet (17 g) in 4 to 8 ounces of liquid and take by mouth daily as needed. 30 each 0   ??? PROGRAF 1 mg capsule Take 4 capsules (4 mg total) by mouth two (2) times a day. 240 capsule 11   ??? sulfamethoxazole-trimethoprim (BACTRIM) 400-80 mg per tablet Take 1 tablet (80 mg of trimethoprim total) by mouth Every Monday, Wednesday, and Friday. 12 tablet 5   ??? traMADoL (ULTRAM) 50 mg tablet Take 1-2 tablets (50-100 mg total) by mouth two (2) times a day as needed for pain 40 tablet 0   ??? valGANciclovir (VALCYTE) 450 mg tablet Take 1 tablet (450 mg total) by mouth daily. 30 tablet 2     No current facility-administered medications for this visit.        No Known Allergies    Patient Active Problem List   Diagnosis   ??? Anemia in chronic kidney disease   ??? ESRD (end stage renal disease) (CMS-HCC)   ??? Gout   ??? Hypertensive renal disease with renal failure   ??? Secondary hyperparathyroidism, renal (CMS-HCC)   ??? End stage renal disease (CMS-HCC)   ??? Hypertension   ??? Abnormal thyroid function test   ??? Multinodular goiter   ??? Family history of diabetes mellitus (DM)   ??? Seborrheic dermatitis of scalp   ??? Follicular cyst of skin   ??? Paroxysmal atrial fibrillation (CMS-HCC)   ??? Encephalopathy acute   ??? SBO (small bowel obstruction) (CMS-HCC)   ??? Swelling of arm   ??? Immunocompromised state (CMS-HCC)   ??? Kidney replaced by transplant       Reviewed  and up to date in Epic.    Appropriateness of Therapy     Is medication and dose appropriate based on diagnosis? Yes    Baseline Quality of Life Assessment       How many days over the past month did your kidney transplant keep you from your normal activities? 0    Financial Information     Medication Assistance provided: None Required    Anticipated copay of $0-Myfortic, $0-Prograf, $1.30-Valganciclovir reviewed with patient. Verified delivery address.    Delivery Information     Scheduled delivery date: 06/11/2019    Expected start date: 06/11/2019    Medication will be delivered via UPS to the home address in [place].  This shipment will require a signature.      Explained the services we provide at [facility] and that each month we would call to set up refills.  Stressed importance of returning phone calls so that we could ensure they receive their medications in time each month.  Informed patient that we should be setting up refills 7-10 days prior to when they will run out of medication.  A pharmacist will reach out to perform a clinical assessment periodically.  Informed patient that a welcome packet and a drug information handout will be sent. Patient verbalized understanding of the above information as well as how to contact the pharmacy at [phone] option 4 with any questions/concerns.  The pharmacy is open Monday through Friday 8:30am-4:30pm.  A pharmacist is available 24/7 via pager to answer any clinical questions they may have.    Patient Specific Needs     ? Does the patient have any physical, cognitive, or cultural barriers? No    ? Patient prefers to have medications discussed with  Patient     ? Is the patient able to read and understand education materials at a high school level or above? Yes    ? Patient's primary language is  [place]     ? Is the patient high risk? Yes, patient taking a REMS drug     ? Does the patient require a Care Management Plan? No     ? Does the patient require physician intervention or other additional services (i.e. nutrition, smoking cessation, social work)? No      Tera Helper  [facility] Specialty Pharmacist

## 2019-06-01 LAB — TACROLIMUS BLOOD: Lab: 14.1

## 2019-06-03 ENCOUNTER — Other Ambulatory Visit: Admit: 2019-06-03 | Discharge: 2019-06-03 | Payer: MEDICARE

## 2019-06-03 ENCOUNTER — Ambulatory Visit: Admit: 2019-06-03 | Discharge: 2019-06-03 | Payer: MEDICARE | Attending: Surgery | Primary: Surgery

## 2019-06-03 ENCOUNTER — Institutional Professional Consult (permissible substitution): Admit: 2019-06-03 | Discharge: 2019-06-03 | Payer: MEDICARE

## 2019-06-03 DIAGNOSIS — D899 Disorder involving the immune mechanism, unspecified: Secondary | ICD-10-CM

## 2019-06-03 DIAGNOSIS — Z940 Kidney transplant status: Principal | ICD-10-CM

## 2019-06-03 DIAGNOSIS — Z79899 Other long term (current) drug therapy: Principal | ICD-10-CM

## 2019-06-03 LAB — CALCIUM: Calcium:MCnc:Pt:Ser/Plas:Qn:: 12.7

## 2019-06-03 LAB — URINALYSIS
BILIRUBIN UA: NEGATIVE
KETONES UA: NEGATIVE
LEUKOCYTE ESTERASE UA: NEGATIVE
NITRITE UA: NEGATIVE
PH UA: 5 (ref 5.0–9.0)
PROTEIN UA: NEGATIVE
RBC UA: 2 /HPF (ref <=3)
SPECIFIC GRAVITY UA: 1.008 (ref 1.003–1.030)
SQUAMOUS EPITHELIAL: <1 /HPF (ref 0–5)
UROBILINOGEN UA: 0.2
WBC UA: 1 /HPF (ref <=2)

## 2019-06-03 LAB — CBC W/ AUTO DIFF
BASOPHILS ABSOLUTE COUNT: 0 10*9/L (ref 0.0–0.1)
BASOPHILS RELATIVE PERCENT: 0.1 %
EOSINOPHILS ABSOLUTE COUNT: 0 10*9/L (ref 0.0–0.4)
EOSINOPHILS RELATIVE PERCENT: 0.2 %
HEMATOCRIT: 32.8 % — ABNORMAL LOW (ref 41.0–53.0)
HEMOGLOBIN: 10.4 g/dL — ABNORMAL LOW (ref 13.5–17.5)
LARGE UNSTAINED CELLS: 0 % (ref 0–4)
LYMPHOCYTES ABSOLUTE COUNT: 0.1 10*9/L — ABNORMAL LOW (ref 1.5–5.0)
LYMPHOCYTES RELATIVE PERCENT: 0.9 %
MEAN CORPUSCULAR HEMOGLOBIN CONC: 31.9 g/dL (ref 31.0–37.0)
MEAN CORPUSCULAR HEMOGLOBIN: 31 pg (ref 26.0–34.0)
MEAN PLATELET VOLUME: 9.1 fL (ref 7.0–10.0)
MONOCYTES ABSOLUTE COUNT: 0.2 10*9/L (ref 0.2–0.8)
NEUTROPHILS ABSOLUTE COUNT: 7.2 10*9/L (ref 2.0–7.5)
NEUTROPHILS RELATIVE PERCENT: 95.8 %
PLATELET COUNT: 282 10*9/L (ref 150–440)
RED BLOOD CELL COUNT: 3.37 10*12/L — ABNORMAL LOW (ref 4.50–5.90)
RED CELL DISTRIBUTION WIDTH: 19.4 % — ABNORMAL HIGH (ref 12.0–15.0)
WBC ADJUSTED: 7.5 10*9/L (ref 4.5–11.0)

## 2019-06-03 LAB — CREATININE, URINE: Lab: 38

## 2019-06-03 LAB — RENAL FUNCTION PANEL
ALBUMIN: 4.3 g/dL (ref 3.5–5.0)
ANION GAP: 12 mmol/L (ref 7–15)
BLOOD UREA NITROGEN: 33 mg/dL — ABNORMAL HIGH (ref 7–21)
BUN / CREAT RATIO: 30
CALCIUM: 12.7 mg/dL (ref 8.5–10.2)
CHLORIDE: 100 mmol/L (ref 98–107)
CO2: 20 mmol/L — ABNORMAL LOW (ref 22.0–30.0)
EGFR CKD-EPI AA MALE: 85 mL/min/{1.73_m2} (ref >=60)
EGFR CKD-EPI NON-AA MALE: 74 mL/min/{1.73_m2} (ref >=60)
PHOSPHORUS: 2.9 mg/dL (ref 2.9–4.7)
POTASSIUM: 5.4 mmol/L — ABNORMAL HIGH (ref 3.5–5.0)
SODIUM: 132 mmol/L — ABNORMAL LOW (ref 135–145)

## 2019-06-03 LAB — TACROLIMUS BLOOD: Lab: 14

## 2019-06-03 LAB — MAGNESIUM: Magnesium:MCnc:Pt:Ser/Plas:Qn:: 1.3 — ABNORMAL LOW

## 2019-06-03 LAB — SQUAMOUS EPITHELIAL: Lab: <1

## 2019-06-03 LAB — RED CELL DISTRIBUTION WIDTH: Lab: 19.4 — ABNORMAL HIGH

## 2019-06-03 LAB — PROTEIN / CREATININE RATIO, URINE: CREATININE, URINE: 38 mg/dL

## 2019-06-03 MED ORDER — TACROLIMUS 1 MG CAPSULE
ORAL_CAPSULE | 11 refills | 0 days | Status: CP
Start: 2019-06-03 — End: 2019-06-11

## 2019-06-03 MED ORDER — MG-PLUS-PROTEIN 133 MG TABLET
ORAL_TABLET | Freq: Two times a day (BID) | ORAL | 11 refills | 0 days | Status: CP
Start: 2019-06-03 — End: 2019-06-11

## 2019-06-03 MED ORDER — AMLODIPINE 10 MG TABLET
ORAL_TABLET | Freq: Every day | ORAL | 11 refills | 0 days | Status: CP
Start: 2019-06-03 — End: 2019-06-04

## 2019-06-03 MED ORDER — ASPIRIN 81 MG TABLET,DELAYED RELEASE
ORAL_TABLET | Freq: Every day | ORAL | 11 refills | 30 days | Status: CP
Start: 2019-06-03 — End: 2020-06-02
  Filled 2019-06-10: qty 30, 30d supply, fill #0

## 2019-06-03 MED ORDER — CINACALCET 60 MG TABLET
ORAL_TABLET | Freq: Every day | ORAL | 11 refills | 0.00000 days | Status: CP
Start: 2019-06-03 — End: 2019-06-11

## 2019-06-03 NOTE — Unmapped (Signed)
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 The patient has done well since surgery, with Cr at 1.1 (0.83 prior) but tac level surpratherapeutic.    Perinephric drain out today  We will see him again in 2 wks  Pharm:   Decrease amlodipine   Tac supratherapeutic - pharm to decrease dosage    Subjective  Louis Dennis is a 58 y.o. 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 The patient is doing quite well. He reports some incisional pain, but managed with minimal tylenol. His drain output has steadily decreased and has been < 30 mL for the last several days. He is eating and drinking without n/v. Having regular bowel movements. Making >2L urine per day. Reports intermittent dizziness and low home BP measurements. Pharm to decrease amlodipine dose.    Objective    Vitals:    06/03/19 0833   BP: 122/71   BP Site: L Arm   BP Position: Sitting   BP Cuff Size: Medium   Pulse: 77   Temp: 36.1 ??C   TempSrc: Tympanic   SpO2: 98%   Weight: 60.2 kg (132 lb 11.2 oz)   Height: 167.6 cm (5' 6)      Body mass index is 21.42 kg/m??.     Physical Exam:    General Appearance:    No acute distress  Lungs:                 Clear to auscultation bilaterally  Heart:                            Regular rate and rhythm  Abdomen:                 Soft, nondistended, minimally TTP around incision. R sided surgical incision c/d/i. Perinephric drain out today.  Extremities:               Warm and well perfused    Data Review:  All lab results last 24 hours:    Recent Results (from the past 24 hour(s))   Renal Function Panel    Collection Time: 06/03/19  8:17 AM   Result Value Ref Range    Sodium 132 (L) 135 - 145 mmol/L    Potassium 5.4 (H) 3.5 - 5.0 mmol/L    Chloride 100 98 - 107 mmol/L    CO2 20.0 (L) 22.0 - 30.0 mmol/L    Anion Gap 12 7 - 15 mmol/L    BUN 33 (H) 7 - 21 mg/dL    Creatinine 4.54 0.98 - 1.30 mg/dL    BUN/Creatinine Ratio 30     EGFR CKD-EPI Non-African American, Male 101 >=60 mL/min/1.68m2    EGFR CKD-EPI African American, Male 79 >=60 mL/min/1.91m2    Glucose 133 70 - 179 mg/dL    Calcium 11.9 (HH) 8.5 - 10.2 mg/dL    Phosphorus 2.9 2.9 - 4.7 mg/dL    Albumin 4.3 3.5 - 5.0 g/dL   Magnesium Level    Collection Time: 06/03/19  8:17 AM   Result Value Ref Range    Magnesium 1.3 (L) 1.6 - 2.2 mg/dL   CBC w/ Differential    Collection Time: 06/03/19  8:17 AM   Result Value Ref Range    WBC 7.5 4.5 - 11.0  10*9/L    RBC 3.37 (L) 4.50 - 5.90 10*12/L    HGB 10.4 (L) 13.5 - 17.5 g/dL    HCT 16.1 (L) 09.6 - 53.0 %    MCV 97.2 80.0 - 100.0 fL    MCH 31.0 26.0 - 34.0 pg    MCHC 31.9 31.0 - 37.0 g/dL    RDW 04.5 (H) 40.9 - 15.0 %    MPV 9.1 7.0 - 10.0 fL    Platelet 282 150 - 440 10*9/L    Neutrophils % 95.8 %    Lymphocytes % 0.9 %    Monocytes % 2.9 %    Eosinophils % 0.2 %    Basophils % 0.1 %    Neutrophil Left Shift 1+ (A) Not Present    Absolute Neutrophils 7.2 2.0 - 7.5 10*9/L    Absolute Lymphocytes 0.1 (L) 1.5 - 5.0 10*9/L    Absolute Monocytes 0.2 0.2 - 0.8 10*9/L    Absolute Eosinophils 0.0 0.0 - 0.4 10*9/L    Absolute Basophils 0.0 0.0 - 0.1 10*9/L    Large Unstained Cells 0 0 - 4 %    Macrocytosis Moderate (A) Not Present    Anisocytosis Moderate (A) Not Present    Hypochromasia Slight (A) Not Present   Urinalysis    Collection Time: 06/03/19  8:49 AM   Result Value Ref Range    Color, UA Light Yellow     Clarity, UA Clear     Specific Gravity, UA 1.008 1.003 - 1.030    pH, UA 5.0 5.0 - 9.0    Leukocyte Esterase, UA Negative Negative    Nitrite, UA Negative Negative    Protein, UA Negative Negative    Glucose, UA Negative Negative    Ketones, UA Negative Negative    Urobilinogen, UA 0.2 mg/dL 0.2 mg/dL, 1.0 mg/dL    Bilirubin, UA Negative Negative    Blood, UA Moderate (A) Negative    RBC, UA 2 <=3 /HPF    WBC, UA 1 <=2 /HPF    Squam Epithel, UA <1 0 - 5 /HPF    Bacteria, UA Rare (A) None Seen /HPF    Mucus, UA Rare (A) None Seen /HPF Imaging:  None

## 2019-06-03 NOTE — Unmapped (Signed)
[facility] PHARMACY NOTE  06/03/2019   Louis Dennis  [id number]    Medication changes today:   1. Decrease amlodipine to 5 mg daily  2. Start magnesium plus protein 133 mg BID  3. Decrease tacrolimus to 3 mg qAM and 2 mg qPM     Education/Adherence tools provided today:  1.provided updated medication list  2. provided additional pill box education  3.  provided additional education on immunosuppression and transplant related medications including reviewing indications of medications, dosing and side effects  4.  provided additional education on amlodipine    Follow up items:  1. goal of understanding indications and dosing of immunosuppression medications  2. Monitor FBG and if still >130 in 1 week start metformin  3. Pill box for accuracy  4. Consider starting statin at a later date    Next visit with pharmacy in 1-2 weeks  ____________________________________________________________________    Louis Dennis is a 58 y.o. 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     Other PMH significant for hypertension, gout    Seen by pharmacy today for: medication management and pill box fill and adherence education; last seen by pharmacy first visit     CC:  Patient complains of incisional site pain    There were no vitals filed for this visit.    No Known Allergies    All medications reviewed and updated.     Medication list includes revisions made during today???s encounter    Outpatient Encounter Medications as of 06/03/2019   Medication Sig Dispense Refill   ??? acetaminophen (TYLENOL) 500 MG tablet Take 1-2 tablets (500-1,000 mg total) by mouth every six (6) hours as needed for pain. 100 tablet 0   ??? allopurinol (ZYLOPRIM) 100 MG tablet Take 100 mg by mouth daily.     ??? amLODIPine (NORVASC) 10 MG tablet Take 1 tablet (10 mg total) by mouth daily. 30 tablet 11   ??? aspirin (ECOTRIN) 81 MG tablet Take 81 mg by mouth daily.     ??? cinacalcet (SENSIPAR) 60 MG tablet Take 1 tablet (60 mg total) by mouth daily. 30 tablet 11   ??? docusate sodium (COLACE) 100 MG capsule Take 1 capsule (100 mg total) by mouth two (2) times a day as needed for constipation. 60 capsule 0   ??? gabapentin (NEURONTIN) 100 MG capsule Take 1 capsule (100 mg total) by mouth nightly for 11 days. 11 capsule 0   ??? linezolid (ZYVOX) 600 mg tablet Take 1 tablet (600 mg total) by mouth Two (2) times a day for 11 days. 22 tablet 0   ??? magnesium oxide-Mg AA chelate (MAGNESIUM, AMINO ACID CHELATE,) 133 mg Tab Take 1 tablet by mouth Two (2) times a day. HOLD until directed to start by your coordinator. 100 tablet 11   ??? metoprolol tartrate (LOPRESSOR) 25 MG tablet Take 1 tablet (25 mg total) by mouth Two (2) times a day. 60 tablet 11   ??? MYFORTIC 180 mg EC tablet Take 3 tablets (540 mg total) by mouth Two (2) times a day. 180 tablet 11   ??? polyethylene glycol (MIRALAX) 17 gram packet Mix 1 packet (17 g) in 4 to 8 ounces of liquid and take by mouth daily as needed. 30 each 0   ??? PROGRAF 1 mg capsule Take 4 capsules (4 mg total) by mouth two (2) times a day. 240 capsule 11   ??? [EXPIRED] sodium polystyrene (KAYEXALATE) powder Take by mouth once  for 1 dose. 15 g 1   ??? sulfamethoxazole-trimethoprim (BACTRIM) 400-80 mg per tablet Take 1 tablet (80 mg of trimethoprim total) by mouth Every Monday, Wednesday, and Friday. 12 tablet 5   ??? tacrolimus (PROGRAF) 1 MG capsule Take 4 capsules (4 mg total) by mouth two (2) times a day. 240 capsule 11   ??? traMADoL (ULTRAM) 50 mg tablet Take 1-2 tablets (50-100 mg total) by mouth two (2) times a day as needed for pain 40 tablet 0   ??? valGANciclovir (VALCYTE) 450 mg tablet Take 1 tablet (450 mg total) by mouth daily. 30 tablet 2     No facility-administered encounter medications on file as of 06/03/2019.      Induction agent : alemtuzumab    CURRENT IMMUNOSUPPRESSION: tacrolimus 4 mg PO bid  prograf/Envarsus/cyclosporine goal: 8-10   [id number]  mg PO bid    steroid free     Patient is tolerating immunosuppression well    IMMUNOSUPPRESSION DRUG LEVELS:  Lab Results   Component Value Date    Tacrolimus, Trough 5.3 05/27/2019    Tacrolimus, Trough 4.3 (L) 05/26/2019    Tacrolimus, Trough 6.6 05/25/2019    Tacrolimus, Timed 14.1 05/31/2019    Tacrolimus, Timed 10.8 05/29/2019     No results found for: CYCLO  No results found for: EVEROLIMUS  No results found for: SIROLIMUS    Prograf level is 11 trough    Graft function: worsening in setting of elevated tac levels  DSA: ntd  Biopsies to date: ntd  WBC/ANC:  wnl    Plan: Will decrease  tacrolimus to 3 mg qAM and 2 mg qPM. Continue to monitor.    OI Prophylaxis:   CMV Status: D+/ R+, moderate risk . CMV prophylaxis: valganciclovir 450 mg daily x 3 months per protocol.  No results found for: CMVCP  PCP Prophylaxis: bactrim SS 1 tab MWF x 6 months.  Thrush: completed in hospital   Donor positive BCx s. [place]: dapto inpatient - linezolid 600 mg BID outpt thru 7/3  Patient is  tolerating infectious prophylaxis well    Plan: Continue per protocol. Continue to monitor.    CV Prophylaxis: asa 81 mg   The 10-year ASCVD risk score Denman George DC Jr., et al., 2013) is: 10.4%  Statin therapy: Indicated; currently on no statin  Plan: consider starting statin at a later date. Continue to monitor     BP/history of paroxysmal AF: Goal < 140/90. Clinic vitals reported above  Home BP ranges:  Date AM BP PM BP   23-Jun 156/80 132/70   24-Jun 118/66 120/70   25-Jun 115/73 120/65   26-Jun 129/84 104/62   27-Jun 119/73 109/69   28-Jun 108/69    Current meds include: amlodipine 10 mg daily (had been instructed to hold at last visit but hadn't made change as it was present in pill box), metoprolol 25 mg BID   Plan: out of goal decrease amlodipine to 5 mg daily. Continue to monitor    Anemia of CKD:  H/H:   Lab Results   Component Value Date    HGB 11.3 (L) 05/31/2019     Lab Results   Component Value Date    HCT 35.6 (L) 05/31/2019     Iron panel:  No results found for: IRON, TIBC, FERRITIN  No results found for: LABIRON    Prior ESA use: none post transplant    Plan: within goal. Continue to monitor.     DM:   Lab Results  Component Value Date    A1C 5.0 04/02/2019   . Goal A1c < 7  History of Dm? No  Established with endocrinologist/PCP for BG managment? No  Currently on: no medications  Home BS log:not checking but FBG on labs have been in 130s  Diet:did not address  Exercise:not yet  Fluid intake: ~2L daily  Plan:  Encouraged increased fluid intake and asked him not to eat prior to labs.  If FBG continues to be elevated can consider adding metformin.    Electrolytes: Calcium 12.7, K 5.4, mag 1.3  Meds currently on: Sensipar 60 mg daily started over the weekend  Plan: Start mg plus protein 133 mg BID.  Monitor calcium.  Encouraged low K+ containing diet.  Continue to monitor     GI/BM: pt reports no diarrhea or constipation  Meds currently on: docusate and Miralax PRN (not using)  Plan: Continue to monitor. Continue to monitor    Pain: pt reports moderate incisional site pain and some pain with urination that subsides after stream starts  Meds currently on: APAP PRN, tramadol PRN, gabapentin per protocol  Plan: Continue to monitor    Bone health:   Vitamin D Level: none available. Goal > 30.   Last DEXA results:  none available  Current meds include: Sensipar 60 mg daily  Plan: Vitamin D level  needs to be drawn with next lab schedule. Continue to monitor.     Women's/Men's Health:  Louis Dennis is a 58 y.o. male. Patient reports no men's/women's health issues  Plan: Continue to monitor    Gout - no recent flares  Medications currently on: allopurinol 100 mg daily  Plan: continue to monitor    Adherence: Patient has poor understanding of medications; was not able to independently identify names/doses of immunosuppressants and OI meds.  Patient  does not fill their own pill box on a regular basis at home.  His wife manages his medications and was not present at today's visit. His son, Jonny Ruiz, accompanied him.   Patient brought medication card:yes  Pill [id number] - had amlodipine 10 mg daily in his pill box, which he was told to stop  Plan: Encouraged patient to learn more about medications; provided extensive adherence counseling/intervention    Spent approximately 40 minutes on educating this patient and greater than 50% was spent in direct face to face counseling regarding post transplant medication education. Questions and concerns were address to patient's satisfaction.    Patient was reviewed with Dr. Rush Barer who was agreement with the stated plan:     During this visit, the following was completed:   BG log data assessment  BP log data assessment  Labs ordered and evaluated  complex treatment plan >1 DS   Patient education was completed for 11-24 minutes     All questions/concerns were addressed to the patient's satisfaction.  __________________________________________  Cleone Slim, PHARMD, CPP  [employer] TR[facility]PL[facility] CLINICAL PHARMACIST PRACTITIONER  PAGER [phone]

## 2019-06-03 NOTE — Unmapped (Signed)
Reviewed tac level with Cleone Slim CPP, decreasing Prograf to 3mg  in am and 2mg  in pm. Pt made aware using [employer] [phone]), verbalized understanding. Updated prescription sent to [facility].

## 2019-06-03 NOTE — Unmapped (Signed)
Transplant Coordinator, Clinic Visit   Pt overall feeling well, accompanied to clinic by son Jonny Ruiz. Drain removed.   Assessment  BP: 122/71. HR 77 today, at home 104-123/60-70s, HR 80s  Headache: no  Hand tremors: no  Numbness/tingling: no  Fevers: no  Chills/sweats: no  Shortness of breath: no  Chest pain or pressure: no  Palpitations: no  Nausea/vomiting: no  Diarrhea/constipation: no  UTI symptoms: burning with initiation of stream at night only, UA/UCX sent   Swelling: no  Sleep: no  Pain: 4/10 today, taking tylenol 1000mg  PRN  Incision: C/D/I, staples remain intact, bruising noted at bottom of incision  Drain: 40-38ml per day  Intake/Output: intake 2400 per day, UOP 2400 per day    Immunosuppressant last taken: taking at 9am/9pm    Per Cleone Slim CPP: decrease Amlodipine 5mg  daily and start Magnesium 1 tablet BID, pt aware he will need to make change in pillbox.     I spent a total of 40 minutes with Louis Dennis reviewing medications and symptoms.

## 2019-06-03 NOTE — Unmapped (Signed)
Urine has been collected and sent to lab.  

## 2019-06-03 NOTE — Unmapped (Signed)
Urine was collected and sent to the lab.

## 2019-06-03 NOTE — Unmapped (Signed)
You had your drain removed today. Please keep drain site covered with gauze. We will see you again in 2 weeks.

## 2019-06-04 MED ORDER — CIPROFLOXACIN 250 MG TABLET
ORAL_TABLET | Freq: Two times a day (BID) | ORAL | 0 refills | 0 days | Status: CP
Start: 2019-06-04 — End: 2019-06-05

## 2019-06-04 NOTE — Unmapped (Signed)
[place] [facility] (CAMP) Clinic-- [facility])    Brief chart review conducted to assess eligibility and potential benefit from enrollment in the [place] Assessment of Medications Program (CAMP). Patient does not currently meet eligibility requirements. Enrollment will be deferred at this time.    Lilla Shook, PharmD, BCPS, CPP  Clinical Pharmacist- [place] [facility]ations [facility] (CAMP)  Direct Phone Number: [phone]  CAMP Clinic: [phone] - Fax: [phone]

## 2019-06-04 NOTE — Unmapped (Signed)
Dose decrease on Prograf to 3/2.  Patient aware per coordinator note on 6/29.  Will adjust call accordingly.

## 2019-06-04 NOTE — Unmapped (Signed)
Pt son called, pt BP this afternoon 80/50 and then he retook it and it was 90/65. Pt feeling very dizzy and decreased energy. Reviewed with Dr. Gwynneth Munson, pt will STOP amlodipine. Asked that son and pt remove amlodipine from pill box.     Pt also has Klebsiella in his urine, waiting for sensitivities. Pt will start Ciprofloxacin 250mg  BID x 7 days per Dr. Gwynneth Munson. Prescription sent to [facility] [place], [place].      Pt made aware, verbalized understanding.

## 2019-06-05 ENCOUNTER — Ambulatory Visit: Admit: 2019-06-05 | Discharge: 2019-06-06 | Payer: MEDICARE

## 2019-06-05 DIAGNOSIS — D899 Disorder involving the immune mechanism, unspecified: Secondary | ICD-10-CM

## 2019-06-05 DIAGNOSIS — Z940 Kidney transplant status: Principal | ICD-10-CM

## 2019-06-05 LAB — RENAL FUNCTION PANEL
ALBUMIN: 4.5 g/dL (ref 3.5–5.0)
ANION GAP: 10 mmol/L (ref 7–15)
BLOOD UREA NITROGEN: 32 mg/dL — ABNORMAL HIGH (ref 7–21)
BUN / CREAT RATIO: 31
CALCIUM: 13.1 mg/dL (ref 8.5–10.2)
CHLORIDE: 104 mmol/L (ref 98–107)
CO2: 21 mmol/L — ABNORMAL LOW (ref 22.0–30.0)
EGFR CKD-EPI NON-AA MALE: 79 mL/min/{1.73_m2} (ref >=60)
GLUCOSE RANDOM: 117 mg/dL (ref 70–179)
POTASSIUM: 5.8 mmol/L — ABNORMAL HIGH (ref 3.5–5.0)
SODIUM: 135 mmol/L (ref 135–145)

## 2019-06-05 LAB — CBC W/ AUTO DIFF
BASOPHILS ABSOLUTE COUNT: 0 10*9/L (ref 0.0–0.1)
BASOPHILS RELATIVE PERCENT: 0.2 %
EOSINOPHILS ABSOLUTE COUNT: 0 10*9/L (ref 0.0–0.4)
EOSINOPHILS RELATIVE PERCENT: 0 %
HEMATOCRIT: 30.6 % — ABNORMAL LOW (ref 41.0–53.0)
HEMOGLOBIN: 10.3 g/dL — ABNORMAL LOW (ref 13.5–17.5)
LYMPHOCYTES ABSOLUTE COUNT: 0.1 10*9/L — ABNORMAL LOW (ref 1.5–5.0)
LYMPHOCYTES RELATIVE PERCENT: 0.7 %
MEAN CORPUSCULAR HEMOGLOBIN CONC: 33.7 g/dL (ref 31.0–37.0)
MEAN CORPUSCULAR HEMOGLOBIN: 30.7 pg (ref 26.0–34.0)
MEAN CORPUSCULAR VOLUME: 91.1 fL (ref 80.0–100.0)
MEAN PLATELET VOLUME: 9.8 fL (ref 7.0–10.0)
MONOCYTES RELATIVE PERCENT: 2.2 %
NEUTROPHILS ABSOLUTE COUNT: 8.1 10*9/L — ABNORMAL HIGH (ref 2.0–7.5)
NEUTROPHILS RELATIVE PERCENT: 96.9 %
PLATELET COUNT: 279 10*9/L (ref 150–440)
RED BLOOD CELL COUNT: 3.36 10*12/L — ABNORMAL LOW (ref 4.50–5.90)
RED CELL DISTRIBUTION WIDTH: 16 % — ABNORMAL HIGH (ref 12.0–15.0)

## 2019-06-05 LAB — TACROLIMUS BLOOD: Lab: 7.4

## 2019-06-05 LAB — CHLORIDE: Chloride:SCnc:Pt:Ser/Plas:Qn:: 104

## 2019-06-05 LAB — MAGNESIUM: Magnesium:MCnc:Pt:Ser/Plas:Qn:: 1.3 — ABNORMAL LOW

## 2019-06-05 LAB — RED BLOOD CELL COUNT: Lab: 3.36 — ABNORMAL LOW

## 2019-06-05 MED ORDER — CEPHALEXIN 500 MG CAPSULE
ORAL_CAPSULE | Freq: Three times a day (TID) | ORAL | 0 refills | 0.00000 days | Status: CP
Start: 2019-06-05 — End: 2019-06-17

## 2019-06-05 NOTE — Unmapped (Signed)
Reviewed sensitivities from UCX with Dr. Gwynneth Munson, resistant to Ciprofloxacin which was started yesterday. Starting Keflex 500mg  TID x 7 days, [facility] local [facility]. Pt made aware using [employer] [phone]).

## 2019-06-06 MED ORDER — OMEPRAZOLE 20 MG TABLET,DELAYED RELEASE: 20 mg | tablet | Freq: Every day | 11 refills | 0 days | Status: AC

## 2019-06-06 MED ORDER — OMEPRAZOLE 20 MG TABLET,DELAYED RELEASE
ORAL_TABLET | Freq: Every day | ORAL | 11 refills | 0.00000 days | Status: CP | PRN
Start: 2019-06-06 — End: 2019-06-06

## 2019-06-06 NOTE — Unmapped (Signed)
Pt son Louis Dennis called c/o acid reflux and occasional vomiting after drink large amounts of water. Omeprazole ordered and sent to local [facility]. Pt made aware, verbalized understanding.

## 2019-06-10 ENCOUNTER — Institutional Professional Consult (permissible substitution): Admit: 2019-06-10 | Discharge: 2019-06-10 | Payer: MEDICARE | Attending: Registered" | Primary: Registered"

## 2019-06-10 ENCOUNTER — Ambulatory Visit: Admit: 2019-06-10 | Discharge: 2019-06-10 | Payer: MEDICARE

## 2019-06-10 DIAGNOSIS — Z940 Kidney transplant status: Principal | ICD-10-CM

## 2019-06-10 DIAGNOSIS — D899 Disorder involving the immune mechanism, unspecified: Secondary | ICD-10-CM

## 2019-06-10 LAB — RENAL FUNCTION PANEL
ALBUMIN: 4.1 g/dL (ref 3.5–5.0)
ANION GAP: 8 mmol/L (ref 7–15)
BLOOD UREA NITROGEN: 31 mg/dL — ABNORMAL HIGH (ref 7–21)
BUN / CREAT RATIO: 39
CALCIUM: 13.1 mg/dL (ref 8.5–10.2)
CHLORIDE: 106 mmol/L (ref 98–107)
CREATININE: 0.79 mg/dL (ref 0.70–1.30)
EGFR CKD-EPI AA MALE: >90 mL/min/{1.73_m2} (ref >=60)
EGFR CKD-EPI NON-AA MALE: >90 mL/min/{1.73_m2} (ref >=60)
GLUCOSE RANDOM: 109 mg/dL (ref 70–179)
POTASSIUM: 5.5 mmol/L — ABNORMAL HIGH (ref 3.5–5.0)
SODIUM: 135 mmol/L (ref 135–145)

## 2019-06-10 LAB — CBC W/ AUTO DIFF
BASOPHILS ABSOLUTE COUNT: 0 10*9/L (ref 0.0–0.1)
BASOPHILS RELATIVE PERCENT: 0.6 %
EOSINOPHILS ABSOLUTE COUNT: 0 10*9/L (ref 0.0–0.4)
EOSINOPHILS RELATIVE PERCENT: 0.6 %
HEMATOCRIT: 31.2 % — ABNORMAL LOW (ref 41.0–53.0)
HEMOGLOBIN: 10.1 g/dL — ABNORMAL LOW (ref 13.5–17.5)
LYMPHOCYTES ABSOLUTE COUNT: 0 10*9/L — ABNORMAL LOW (ref 1.5–5.0)
LYMPHOCYTES RELATIVE PERCENT: 0.7 %
MEAN CORPUSCULAR HEMOGLOBIN CONC: 32.4 g/dL (ref 31.0–37.0)
MEAN CORPUSCULAR HEMOGLOBIN: 32.3 pg (ref 26.0–34.0)
MEAN PLATELET VOLUME: 7.3 fL (ref 7.0–10.0)
MONOCYTES ABSOLUTE COUNT: 0.2 10*9/L (ref 0.2–0.8)
NEUTROPHILS ABSOLUTE COUNT: 4.4 10*9/L (ref 2.0–7.5)
NEUTROPHILS RELATIVE PERCENT: 94 %
PLATELET COUNT: 137 10*9/L — ABNORMAL LOW (ref 150–440)
RED BLOOD CELL COUNT: 3.13 10*12/L — ABNORMAL LOW (ref 4.50–5.90)
RED CELL DISTRIBUTION WIDTH: 18.7 % — ABNORMAL HIGH (ref 12.0–15.0)
WBC ADJUSTED: 4.7 10*9/L (ref 4.5–11.0)

## 2019-06-10 LAB — WBC ADJUSTED: Lab: 4.7

## 2019-06-10 LAB — TACROLIMUS BLOOD: Lab: 11.2

## 2019-06-10 LAB — EGFR CKD-EPI NON-AA MALE: Lab: >90

## 2019-06-10 LAB — TACROLIMUS LEVEL: TACROLIMUS BLOOD: 11.2 ng/mL

## 2019-06-10 LAB — MAGNESIUM: Magnesium:MCnc:Pt:Ser/Plas:Qn:: 1.2 — ABNORMAL LOW

## 2019-06-10 MED FILL — ASPIRIN 81 MG TABLET,DELAYED RELEASE: 30 days supply | Qty: 30 | Fill #0 | Status: AC

## 2019-06-10 NOTE — Unmapped (Signed)
Outpatient Nutrition - Post Renal Transplant Follow-up       Pt evaluated and/or assessed via phone/video encounter due to infectious outbreak in order to minimize contact between this Provider and Patient/Caregiver.     Prior to visit chart was reviewed and a phone/video visit is appropriate at this time.    Pt is aware of co-payment and is willing to pay.    Pt is located in [employer] for the entire visit.       Referring MD or Clinic: Loran Senters     Reason for Visit: Post Renal Transplant Evaluation Follow-up     Renal Transplant Date: 05/24/2019    NUTRITION ASSESSMENT     New developments since RD visit on 05/24/19  Following food safety guidelines like cooking meats well done, eggs, cook vegetables, avoiding restaurants   Trying to limit sodium       Nutrition Goals from RD visit on 05/24/2019:     - Patient to consume 75% of 3 meals per day-met  Food/Nutrition Knowledge, Lifestyle Modifications:      - Patient will make appropriate nutritional choices with regards to prescribed diet of post transplant food safety precautions-following     Anthropometrics  Height:  167.6 cm (5' 6)   Weight: 59 kg (130 lb 1.6 oz)    Body mass index is 21 kg/m??.    Weight History:     Wt Readings from Last 3 Encounters:   06/10/19 59 kg (130 lb 1.6 oz)   06/03/19 59.9 kg (132 lb)   06/03/19 60.2 kg (132 lb 11.2 oz)     05/23/19 66.2 kg (145 lb 14.4 oz)   04/26/19 66.7 kg (147 lb)   04/02/19 69 kg (152 lb 3.2 oz)   01/09/19 69.9 kg (154 lb 1.6 oz)   12/26/18 70 kg (154 lb 6.4 oz)   12/12/18 74.4 kg (164 lb)   11/22/18 74.5 kg (164 lb 3.9 oz)   08/24/18 74.4 kg (164 lb 1.9 oz)   06/15/18 70.8 kg (156 lb)   05/25/18 70.8 kg (156 lb)         Weight change: wt loss of 15 lbs during past 3 weeks     Ideal body weight: 64.49 kg  Usual body weight: 145 lbs     Nutrition-Focused Physical Findings:    Nutrition Focused Physical Exam:           unable to complete at this time d/t COVID 19 precautions Malnutrition Assessment using AND/ASPEN Clinical Characteristics:  Unable to complete Malnutrition Assessment at this time due to (comment) (06/10/19 1005)          unable to complete at this time d/t COVID 19 precautions    Relevant Medications, Herbs, Supplements include:   Reviewed nutritionally relevant medications and supplements with patient.   Aware of food drug interactions      Current Outpatient Medications   Medication Sig Dispense Refill   ??? acetaminophen (TYLENOL) 500 MG tablet Take 1-2 tablets (500-1,000 mg total) by mouth every six (6) hours as needed for pain. 100 tablet 0   ??? allopurinol (ZYLOPRIM) 100 MG tablet Take 100 mg by mouth daily.     ??? aspirin (ECOTRIN) 81 MG tablet Take 1 tablet (81 mg total) by mouth daily. 30 tablet 11   ??? cephalexin (KEFLEX) 500 MG capsule Take 1 capsule (500 mg total) by mouth Three (3) times a day for 7 days. 21 capsule 0   ???  cinacalcet (SENSIPAR) 60 MG tablet Take 1 tablet (60 mg total) by mouth daily. 30 tablet 11   ??? docusate sodium (COLACE) 100 MG capsule Take 1 capsule (100 mg total) by mouth two (2) times a day as needed for constipation. (Patient not taking: Reported on 06/03/2019) 60 capsule 0   ??? gabapentin (NEURONTIN) 100 MG capsule Take 1 capsule (100 mg total) by mouth nightly for 11 days. 11 capsule 0   ??? magnesium oxide-Mg AA chelate (MAGNESIUM, AMINO ACID CHELATE,) 133 mg Tab Take 1 tablet by mouth Two (2) times a day. 100 tablet 11   ??? metoprolol tartrate (LOPRESSOR) 25 MG tablet Take 1 tablet (25 mg total) by mouth Two (2) times a day. 60 tablet 11   ??? MYFORTIC 180 mg EC tablet Take 3 tablets (540 mg total) by mouth Two (2) times a day. 180 tablet 11   ??? omeprazole 20 mg tablet Take 1 tablet (20 mg total) by mouth daily as needed. 30 tablet 11   ??? polyethylene glycol (MIRALAX) 17 gram packet Mix 1 packet (17 g) in 4 to 8 ounces of liquid and take by mouth daily as needed. (Patient not taking: Reported on 06/03/2019) 30 each 0   ??? PROGRAF 1 mg capsule Take 4 capsules (4 mg total) by mouth two (2) times a day. 240 capsule 11   ??? sulfamethoxazole-trimethoprim (BACTRIM) 400-80 mg per tablet Take 1 tablet (80 mg of trimethoprim total) by mouth Every Monday, Wednesday, and Friday. 12 tablet 5   ??? tacrolimus (PROGRAF) 1 MG capsule Take 3 capsules (3mg ) by mouth in the morning and 2 capsules (2mg ) in the evening. 150 capsule 11   ??? traMADoL (ULTRAM) 50 mg tablet Take 1-2 tablets (50-100 mg total) by mouth two (2) times a day as needed for pain (Patient not taking: Reported on 06/03/2019) 40 tablet 0   ??? valGANciclovir (VALCYTE) 450 mg tablet Take 1 tablet (450 mg total) by mouth daily. 30 tablet 2     No current facility-administered medications for this visit.        Relevant Labs:   Lab Results   Component Value Date    BUN 31 (H) 06/10/2019    CREATININE 0.79 06/10/2019    GFRAA 5 (L) 01/25/2018    GFRNONAA 4 (L) 01/25/2018    NA 135 06/10/2019    K 5.5 (H) 06/10/2019    CL 106 06/10/2019    CO2 21.0 (L) 06/10/2019    CALCIUM 13.1 (HH) 06/10/2019    PHOS 2.3 (L) 06/10/2019    ALBUMIN 4.1 06/10/2019     No results found for: [facility]  Lab Results   Component Value Date    MG 1.2 (L) 06/10/2019    MG 1.3 (L) 06/05/2019    MG 1.3 (L) 06/03/2019    PHOS 2.3 (L) 06/10/2019    PHOS 2.4 (L) 06/05/2019    PHOS 2.9 06/03/2019     Lab Results   Component Value Date    CHOL 151 03/01/2019    HDL 36 (L) 03/01/2019    LDL 80 03/01/2019    TRIG 213 (H) 03/01/2019     Lab Results   Component Value Date    A1C 5.0 04/02/2019    A1C 5.9 03/15/2016    A1C 5.8 09/12/2014     Lab Results   Component Value Date    PROTEINUA Negative 06/03/2019    GLUCOSEU Negative 06/03/2019     Lab Results   Component Value Date  HGB 10.1 (L) 06/10/2019    HCT 31.2 (L) 06/10/2019     No results found for: IRON, TIBC, FERRITIN      Physical Activity:  Patient's physical activity level is light with some exercise. walking about 1 hr daily    Dietary Restrictions, Intolerances:   No known food allergies or food intolerances.     Gastrointestinal Issues:  GERD treated with medication    Hunger and Satiety:   Denied issues.  Endorses good appetite  Denies c/s/n/v/d at this time    Usual Intake:     Time Intake   Breakfast Egg, rice, soup (bone broth with green onions)  vegetables (broccoli, onion, burdock, cabbage) with water or roasted rice tea   Snack (AM) Skips or croissant x 1/2  peach or apple and graham crackers with cranberry juice or apple juice   Lunch Sandwich (bread, cheese, [place], l/onion) with apple juice or water   Snack (PM) Peach or apple and graham crackers with cranberry juice or apple juice   Dinner Rice, soup (bone broth  and green onions), meat (chicken, beef or pork) or fish with vegetables (broccoli, onion, burdock, cabbage) with roasted rice tea   Snack (HS) skips     Snacks:  croissant x 1/2  peach or apple or  graham crackers  Beverages:  Water or water or roasted rice tea or cranberry juice or apple juice  Alcohol: no  Dining Out:  0/21  Meal Schedule: Yes     Behavioral Risk Factors:  Overeating: Denied issues with overeating.   Emotional Eating: No issues noted.       Estimated Daily Nutrient Needs:   Energy: 1990- 2320 kcals [30-35 kcal/kg using admission body weight, 66.2 kg (05/24/19 1553)]  Protein: 100- 130 gm [1.5-2.0 gm/kg using admission body weight, 66.2 kg (05/24/19 1553)]  Carbohydrate:   [no restriction]  Fluid:   [per MD team]    NUTRITION DIAGNOSIS  his usual food intake is not appropriate for compliance of post-renal transplant nutrition therapy. Dietary recall indicates his food patterns are not adequate to meet estimated nutrition needs at this time. Pt po intake includes cultural foods high in K. Pt would benefit from nutritional changes.     Pt experienced a 10.7%involuntary  wt loss during the past 3 weeks which is significant. Wt loss may be likely d/t fluid, limited initial poor po intake, and/or weighing error. Will need to monitor wts.    Pt would benefit from lower K and higher Mg foods to normalize these labs. Some cultural foods consumed are higher in K and discussed reducing these foods.     Patient assessed to have improving food and nutrition-related knowledge deficit for post-renal transplant. He would benefit from continued nutrition education and counseling.     NUTRITION INTERVENTION    Nutrition Goals:  Meet nutritional needs   Reduce long-term health risk  Maintain current wt   Normalization or K and Mg labs     Nutrition Education:   -Post renal transplant nutrition goals and expectations  -Brief review of food safety guidelines  -Staying hydrated per MD recommendations   -Recommendations for strength and endurance    Materials Provided were:  List of recommendations    Handout explaining prescribed diet  RD contact information  Nutrition following kidney transplant will receive assistance from family reading this information  Lower K F/V    NUTRITION EVALUATION & MONITORING:    Laboratory data: 06/10/2019 K 5.5 (H) Mg 1.2 (L) team  aware and monitoring    Food and nutrient intake: high K cultural foods; asked to reduce these while adjusting medication   Physical activity patterns: good     Expected Compliance is:  Comprehension of plan good  Readiness for change good  Ability to meet goals good      Follow-up: Next MD appointment, or when consulted    Length of visit was: 45 minutes    Greta Doom, MS, RDN, CSG, LDN  [phone] pager      Dr. Nestor Lewandowsky was available if needed for this visit.    I spent 45 minutes on the phone with the patient. I spent an additional 15 minutes on pre- and post-visit activities.     The patient was physically located in [place] or a state in which I am permitted to provide care. The patient and/or parent/guardian understood that s/he may incur co-pays and cost sharing, and agreed to the telemedicine visit. The visit was reasonable and appropriate under the circumstances given the patient's presentation at the time.    The patient and/or parent/guardian has been advised of the potential risks and limitations of this mode of treatment (including, but not limited to, the absence of in-person examination) and has agreed to be treated using telemedicine. The patient's/patient's family's questions regarding telemedicine have been answered.     If the visit was completed in an ambulatory setting, the patient and/or parent/guardian has also been advised to contact their provider???s office for worsening conditions, and seek emergency medical treatment and/or call 911 if the patient deems either necessary.

## 2019-06-10 NOTE — Unmapped (Signed)
Continue to 3 meals daily with appropriate protein and include foods high in magnesium foods and lower in potassium foods  Exercise at least 150 minutes weekly-Keep up exercise  Drink at least 80 ounces daily  Keep following food safety guidelines as reviewed

## 2019-06-12 ENCOUNTER — Ambulatory Visit: Admit: 2019-06-12 | Discharge: 2019-06-13 | Payer: MEDICARE

## 2019-06-12 DIAGNOSIS — D899 Disorder involving the immune mechanism, unspecified: Secondary | ICD-10-CM

## 2019-06-12 DIAGNOSIS — Z940 Kidney transplant status: Principal | ICD-10-CM

## 2019-06-12 LAB — CBC W/ AUTO DIFF
BASOPHILS ABSOLUTE COUNT: 0 10*9/L (ref 0.0–0.1)
BASOPHILS RELATIVE PERCENT: 0.8 %
EOSINOPHILS ABSOLUTE COUNT: 0 10*9/L (ref 0.0–0.4)
EOSINOPHILS RELATIVE PERCENT: 0.6 %
HEMATOCRIT: 28.4 % — ABNORMAL LOW (ref 41.0–53.0)
HEMOGLOBIN: 9.4 g/dL — ABNORMAL LOW (ref 13.5–17.5)
LARGE UNSTAINED CELLS: 0 % (ref 0–4)
LYMPHOCYTES ABSOLUTE COUNT: 0 10*9/L — ABNORMAL LOW (ref 1.5–5.0)
LYMPHOCYTES RELATIVE PERCENT: 0.7 %
MEAN CORPUSCULAR HEMOGLOBIN CONC: 33.2 g/dL (ref 31.0–37.0)
MEAN CORPUSCULAR VOLUME: 99.5 fL (ref 80.0–100.0)
MEAN PLATELET VOLUME: 8.4 fL (ref 7.0–10.0)
MONOCYTES ABSOLUTE COUNT: 0.2 10*9/L (ref 0.2–0.8)
MONOCYTES RELATIVE PERCENT: 4.2 %
NEUTROPHILS ABSOLUTE COUNT: 4.7 10*9/L (ref 2.0–7.5)
NEUTROPHILS RELATIVE PERCENT: 93.2 %
PLATELET COUNT: 114 10*9/L — ABNORMAL LOW (ref 150–440)
RED BLOOD CELL COUNT: 2.86 10*12/L — ABNORMAL LOW (ref 4.50–5.90)
RED CELL DISTRIBUTION WIDTH: 18.3 % — ABNORMAL HIGH (ref 12.0–15.0)
WBC ADJUSTED: 5 10*9/L (ref 4.5–11.0)

## 2019-06-12 LAB — TACROLIMUS BLOOD: Lab: 7.1

## 2019-06-12 LAB — RENAL FUNCTION PANEL
ALBUMIN: 4.2 g/dL (ref 3.5–5.0)
ANION GAP: 7 mmol/L (ref 7–15)
BLOOD UREA NITROGEN: 28 mg/dL — ABNORMAL HIGH (ref 7–21)
BUN / CREAT RATIO: 32
CALCIUM: 13.1 mg/dL (ref 8.5–10.2)
CO2: 23 mmol/L (ref 22.0–30.0)
CREATININE: 0.87 mg/dL (ref 0.70–1.30)
EGFR CKD-EPI AA MALE: >90 mL/min/{1.73_m2} (ref >=60)
GLUCOSE RANDOM: 112 mg/dL (ref 70–179)
PHOSPHORUS: 1.9 mg/dL — ABNORMAL LOW (ref 2.9–4.7)
POTASSIUM: 5.7 mmol/L — ABNORMAL HIGH (ref 3.5–5.0)
SODIUM: 137 mmol/L (ref 135–145)

## 2019-06-12 LAB — MEAN CORPUSCULAR HEMOGLOBIN: Lab: 33

## 2019-06-12 LAB — BLOOD UREA NITROGEN: Urea nitrogen:MCnc:Pt:Ser/Plas:Qn:: 28 — ABNORMAL HIGH

## 2019-06-12 LAB — MAGNESIUM: Magnesium:MCnc:Pt:Ser/Plas:Qn:: 1.2 — ABNORMAL LOW

## 2019-06-12 MED ORDER — TACROLIMUS 1 MG CAPSULE
ORAL_CAPSULE | Freq: Two times a day (BID) | ORAL | 11 refills | 0.00000 days | Status: CP
Start: 2019-06-12 — End: 2019-06-17

## 2019-06-12 MED ORDER — MG-PLUS-PROTEIN 133 MG TABLET
ORAL_TABLET | Freq: Two times a day (BID) | ORAL | 11 refills | 0 days | Status: CP
Start: 2019-06-12 — End: 2019-06-17

## 2019-06-12 MED ORDER — CINACALCET 60 MG TABLET
ORAL_TABLET | Freq: Every day | ORAL | 11 refills | 30.00000 days | Status: CP
Start: 2019-06-12 — End: 2019-06-25
  Filled 2019-06-13: qty 60, 30d supply, fill #0

## 2019-06-12 NOTE — Unmapped (Signed)
Reviewed labs with Dr. Gwynneth Munson, decreasing Prograf 2mg  BID, increasing Magnesium 2 tablets BID, increasing Sensipar to 120mg  daily. Pt made aware, verbalized understanding using [employer] [phone]). Updated prescriptions sent to [facility].

## 2019-06-12 NOTE — Unmapped (Signed)
[facility] Medication Referral: No PA required    Medication (Brand/Generic): Cinacalcet    Initial Benefits Investigation Claim completed with resulted information below:  No PA required  Patient ABLE to fill at [facility]  Insurance Company:  [employer]  Anticipated Copay: $1.30    As Co-pay is under $25 defined limit, per policy there will be no further investigation of need for financial assistance at this time unless patient requests. This referral has been communicated to the provider and handed off to the [name] team for further processing and filling of prescribed medication.   ______________________________________________________________________  Please utilize this referral for viewing purposes as it will serve as the central location for all relevant documentation and updates.

## 2019-06-13 MED FILL — SENSIPAR 60 MG TABLET: 30 days supply | Qty: 60 | Fill #0 | Status: AC

## 2019-06-13 NOTE — Unmapped (Signed)
Patient stated he was aware of dose change on Prograf to 2mg  BID.  Pt acknowledged dose through [employer].

## 2019-06-13 NOTE — Unmapped (Addendum)
[facility]   Patient Onboarding/Medication Counseling    Louis Dennis is a 57 y.o. male with kidney transplant who I am counseling today on continuation of therapy.  I am speaking to the patient.  Spoke with patient using Education officer, community.    Verified patient's date of birth / HIPAA.    Specialty medication(s) to be sent: Transplant: Cinacalcet 60mg       Non-specialty medications/supplies to be sent: NONE      Medications not needed at this time: NONE           Sensipar (cinacalcet)    Medication & Administration     Dosage: Take two tablets daily.    Administration:   ? Take with food or shortly after a meal.  ? Take whole, do not chew, crush, or divide tablets    Adherence/Missed dose instructions:  ? Take a missed dose as soon as you think about it, with food.  ? If it is close to the time for your next dose, skip the missed dose and go back to your normal time.  ? Do not take 2 doses at the same time or extra doses.    Goals of Therapy     ? Normalize serum calcium levels    Side Effects & Monitoring Parameters     ? Common side effects  ? Feeling dizzy, tired, or weak  ? Nausea, vomiting, diarrhea, and stomach pain  ? Headache  ? Back pain, muscle spasm/pain  ? Cough  ? Signs of a common cold    ? The following side effects should be reported to the provider:  ? Allergic reaction  ? Signs of high blood pressure (very bad headaches or dizziness, passing out, or change in eyesight)  ? Low calcium levels (muscle cramps or spasms, numbness/tingling, or seizures)  ? Electrolyte issues (mood changes, confusion, muscle pain or weakness, abnormal heartbeat, lack of appetite, severe nausea/vomiting)  ? Dehydration (dry skin, dry mouth, dry eyes, increased thirst, fast heartbeat, dizziness, fast breathing, or confusion)  ? Heart issues (cough or shortness of breath, swelling in ankles or legs, abnormal heartbeat, weight gain > 5lbs in 24 hours, dizziness/passing out  ? Chest pain  ? Depression  ? Joint pain  ? Bone pain  ? Severe loss of strength and energy  ? Signs of stomach or bowel bleeding (black, tarry or bloddy stools, throwing up blood, throw up that looks like coffee grounds, very bad stomach pain, very upset stomach or throwing up)    ? Monitoring Parameters  ? Monitor serum calcium and phosphorus levels prior to starting and monthly   ? Monitor intact parathyroid hormone levels prior to starting and throughout therapy    Contraindications, Warnings, & Precautions     ? Adynamic bone disease - may develop if intact parathyroid hormone levels are suppressed <100pg/ml  ? Cardiovascular effects - QT prolongation and ventricular arrhythmia secondary to hypocalcemia may occur  ? GI effects - GI bleeding  ? Hypocalcemia    Drug/Food Interactions     ? Medication list reviewed in Epic. The patient was instructed to inform the care team before taking any new medications or supplements. No drug interactions identified.     Storage, Handling Precautions, & Disposal   ? Store at room temperature  ? Keep away from children and pets        Current Medications (including OTC/herbals), Comorbidities and Allergies     Current Outpatient Medications   Medication Sig Dispense  Refill   ??? acetaminophen (TYLENOL) 500 MG tablet Take 1-2 tablets (500-1,000 mg total) by mouth every six (6) hours as needed for pain. 100 tablet 0   ??? allopurinol (ZYLOPRIM) 100 MG tablet Take 100 mg by mouth daily.     ??? aspirin (ECOTRIN) 81 MG tablet Take 1 tablet (81 mg total) by mouth daily. 30 tablet 11   ??? cinacalcet (SENSIPAR) 60 MG tablet Take 2 tablets (120 mg total) by mouth daily. 60 tablet 11   ??? docusate sodium (COLACE) 100 MG capsule Take 1 capsule (100 mg total) by mouth two (2) times a day as needed for constipation. (Patient not taking: Reported on 06/03/2019) 60 capsule 0   ??? gabapentin (NEURONTIN) 100 MG capsule Take 1 capsule (100 mg total) by mouth nightly for 11 days. 11 capsule 0   ??? magnesium oxide-Mg AA chelate (MAGNESIUM, AMINO ACID CHELATE,) 133 mg Tab Take 2 tablets by mouth Two (2) times a day. 120 tablet 11   ??? metoprolol tartrate (LOPRESSOR) 25 MG tablet Take 1 tablet (25 mg total) by mouth Two (2) times a day. 60 tablet 11   ??? MYFORTIC 180 mg EC tablet Take 3 tablets (540 mg total) by mouth Two (2) times a day. 180 tablet 11   ??? omeprazole 20 mg tablet Take 1 tablet (20 mg total) by mouth daily as needed. 30 tablet 11   ??? polyethylene glycol (MIRALAX) 17 gram packet Mix 1 packet (17 g) in 4 to 8 ounces of liquid and take by mouth daily as needed. (Patient not taking: Reported on 06/03/2019) 30 each 0   ??? PROGRAF 1 mg capsule Take 4 capsules (4 mg total) by mouth two (2) times a day. 240 capsule 11   ??? sulfamethoxazole-trimethoprim (BACTRIM) 400-80 mg per tablet Take 1 tablet (80 mg of trimethoprim total) by mouth Every Monday, Wednesday, and Friday. 12 tablet 5   ??? tacrolimus (PROGRAF) 1 MG capsule Take 2 capsules (2 mg total) by mouth two (2) times a day. 120 capsule 11   ??? traMADoL (ULTRAM) 50 mg tablet Take 1-2 tablets (50-100 mg total) by mouth two (2) times a day as needed for pain (Patient not taking: Reported on 06/03/2019) 40 tablet 0   ??? valGANciclovir (VALCYTE) 450 mg tablet Take 1 tablet (450 mg total) by mouth daily. 30 tablet 2     No current facility-administered medications for this visit.        No Known Allergies    Patient Active Problem List   Diagnosis   ??? Anemia in chronic kidney disease   ??? ESRD (end stage renal disease) (CMS-HCC)   ??? Gout   ??? Hypertensive renal disease with renal failure   ??? Secondary hyperparathyroidism, renal (CMS-HCC)   ??? End stage renal disease (CMS-HCC)   ??? Hypertension   ??? Abnormal thyroid function test   ??? Multinodular goiter   ??? Family history of diabetes mellitus (DM)   ??? Seborrheic dermatitis of scalp   ??? Follicular cyst of skin   ??? Paroxysmal atrial fibrillation (CMS-HCC)   ??? Encephalopathy acute   ??? SBO (small bowel obstruction) (CMS-HCC)   ??? Swelling of arm   ??? Immunocompromised state (CMS-HCC)   ??? Kidney replaced by transplant       Reviewed and up to date in Epic.    Appropriateness of Therapy     Is medication and dose appropriate based on diagnosis? Yes    Baseline Quality of Life Assessment  How many days over the past month did your kidney transplant keep you from your normal activities? 0    Financial Information     Medication Assistance provided: None Required    Anticipated copay of $1.30 reviewed with patient. Verified delivery address.    Delivery Information     Scheduled delivery date: 06/14/2019    Expected start date: 06/14/2019    Medication will be delivered via UPS to the home address in [facility].  This shipment will not require a signature.      Explained the services we provide at [facility] and that each month we would call to set up refills.  Stressed importance of returning phone calls so that we could ensure they receive their medications in time each month.  Informed patient that we should be setting up refills 7-10 days prior to when they will run out of medication.  A pharmacist will reach out to perform a clinical assessment periodically.  Informed patient that a welcome packet and a drug information handout will be sent.      Patient verbalized understanding of the above information as well as how to contact the pharmacy at [phone] option 4 with any questions/concerns.  The pharmacy is open Monday through Friday 8:30am-4:30pm.  A pharmacist is available 24/7 via pager to answer any clinical questions they may have.    Patient Specific Needs     ? Does the patient have any physical, cognitive, or cultural barriers? No    ? Patient prefers to have medications discussed with  Patient     ? Is the patient able to read and understand education materials at a high school level or above? Yes    ? Patient's primary language is  [place]     ? Is the patient high risk? Yes, patient taking a REMS drug     ? Does the patient require a Care Management Plan? No     ? Does the patient require physician intervention or other additional services (i.e. nutrition, smoking cessation, social work)? No      Tera Helper  [facility] Specialty Pharmacist

## 2019-06-13 NOTE — Unmapped (Signed)
Pt recently transplanted    ESRD:Hypertensive Nephrosclerosis   HX/ Comorbidities: enlarged thyroid since 2013, multinodular goiter, gout,  SBO 11/2018 had exploratory lap  Diagnostic testing needing follow up: f/u thryoid. Had BX 2020  HM: colonoscopy 2019, polyps, f/u 5 yrs   SW/Psychosocial concerns: none  See FYI's

## 2019-06-14 ENCOUNTER — Ambulatory Visit: Admit: 2019-06-14 | Discharge: 2019-06-15 | Payer: MEDICARE

## 2019-06-14 DIAGNOSIS — Z940 Kidney transplant status: Principal | ICD-10-CM

## 2019-06-14 DIAGNOSIS — D899 Disorder involving the immune mechanism, unspecified: Secondary | ICD-10-CM

## 2019-06-14 LAB — RENAL FUNCTION PANEL
ALBUMIN: 4.3 g/dL (ref 3.5–5.0)
BLOOD UREA NITROGEN: 24 mg/dL — ABNORMAL HIGH (ref 7–21)
BUN / CREAT RATIO: 28
CALCIUM: 13.4 mg/dL (ref 8.5–10.2)
CO2: 23 mmol/L (ref 22.0–30.0)
CREATININE: 0.85 mg/dL (ref 0.70–1.30)
EGFR CKD-EPI AA MALE: >90 mL/min/{1.73_m2} (ref >=60)
EGFR CKD-EPI NON-AA MALE: >90 mL/min/{1.73_m2} (ref >=60)
GLUCOSE RANDOM: 112 mg/dL (ref 70–179)
POTASSIUM: 5.7 mmol/L — ABNORMAL HIGH (ref 3.5–5.0)
SODIUM: 138 mmol/L (ref 135–145)

## 2019-06-14 LAB — TACROLIMUS BLOOD: Lab: 6.1

## 2019-06-14 LAB — CBC W/ AUTO DIFF
BASOPHILS ABSOLUTE COUNT: 0 10*9/L (ref 0.0–0.1)
BASOPHILS RELATIVE PERCENT: 0.6 %
EOSINOPHILS ABSOLUTE COUNT: 0 10*9/L (ref 0.0–0.4)
EOSINOPHILS RELATIVE PERCENT: 0.9 %
HEMOGLOBIN: 9.3 g/dL — ABNORMAL LOW (ref 13.5–17.5)
LARGE UNSTAINED CELLS: 1 % (ref 0–4)
LYMPHOCYTES ABSOLUTE COUNT: 0 10*9/L — ABNORMAL LOW (ref 1.5–5.0)
LYMPHOCYTES RELATIVE PERCENT: 0.9 %
MEAN CORPUSCULAR HEMOGLOBIN CONC: 32.9 g/dL (ref 31.0–37.0)
MEAN CORPUSCULAR HEMOGLOBIN: 32.4 pg (ref 26.0–34.0)
MEAN CORPUSCULAR VOLUME: 98.4 fL (ref 80.0–100.0)
MEAN PLATELET VOLUME: 8.1 fL (ref 7.0–10.0)
MONOCYTES ABSOLUTE COUNT: 0.1 10*9/L — ABNORMAL LOW (ref 0.2–0.8)
MONOCYTES RELATIVE PERCENT: 3.9 %
NEUTROPHILS ABSOLUTE COUNT: 3.4 10*9/L (ref 2.0–7.5)
NEUTROPHILS RELATIVE PERCENT: 93 %
PLATELET COUNT: 164 10*9/L (ref 150–440)
RED BLOOD CELL COUNT: 2.86 10*12/L — ABNORMAL LOW (ref 4.50–5.90)
RED CELL DISTRIBUTION WIDTH: 18.2 % — ABNORMAL HIGH (ref 12.0–15.0)

## 2019-06-14 LAB — NEUTROPHILS RELATIVE PERCENT: Lab: 93

## 2019-06-14 LAB — MAGNESIUM: Magnesium:MCnc:Pt:Ser/Plas:Qn:: 1.4 — ABNORMAL LOW

## 2019-06-14 LAB — EGFR CKD-EPI AA MALE: Lab: >90

## 2019-06-14 MED ORDER — TACROLIMUS 1 MG CAPSULE
ORAL_CAPSULE | 11 refills | 0 days | Status: CP
Start: 2019-06-14 — End: ?

## 2019-06-14 MED ORDER — PROGRAF 1 MG CAPSULE
ORAL_CAPSULE | 11 refills | 0 days | Status: CP
Start: 2019-06-14 — End: 2019-06-17
  Filled 2019-06-20: qty 150, 30d supply, fill #0

## 2019-06-14 NOTE — Unmapped (Signed)
Reviewed tac level with L. Mincemoyer CPP, increasing Prograf to 3mg  in am and 2mg  in pm. Pt made aware using [employer] [phone]). Updated prescription sent to [facility].

## 2019-06-17 ENCOUNTER — Ambulatory Visit: Admit: 2019-06-17 | Discharge: 2019-06-17 | Payer: MEDICARE

## 2019-06-17 ENCOUNTER — Ambulatory Visit: Admit: 2019-06-17 | Discharge: 2019-06-17 | Payer: MEDICARE | Attending: Surgery | Primary: Surgery

## 2019-06-17 ENCOUNTER — Institutional Professional Consult (permissible substitution): Admit: 2019-06-17 | Discharge: 2019-06-17 | Payer: MEDICARE

## 2019-06-17 DIAGNOSIS — Z79899 Other long term (current) drug therapy: Secondary | ICD-10-CM

## 2019-06-17 DIAGNOSIS — D899 Disorder involving the immune mechanism, unspecified: Secondary | ICD-10-CM

## 2019-06-17 DIAGNOSIS — Z940 Kidney transplant status: Principal | ICD-10-CM

## 2019-06-17 LAB — CBC W/ AUTO DIFF
BASOPHILS RELATIVE PERCENT: 0.7 %
EOSINOPHILS ABSOLUTE COUNT: 0.1 10*9/L (ref 0.0–0.4)
EOSINOPHILS RELATIVE PERCENT: 1.2 %
HEMATOCRIT: 28.9 % — ABNORMAL LOW (ref 41.0–53.0)
HEMOGLOBIN: 9.2 g/dL — ABNORMAL LOW (ref 13.5–17.5)
LARGE UNSTAINED CELLS: 0 % (ref 0–4)
LYMPHOCYTES ABSOLUTE COUNT: 0 10*9/L — ABNORMAL LOW (ref 1.5–5.0)
LYMPHOCYTES RELATIVE PERCENT: 0.9 %
MEAN CORPUSCULAR VOLUME: 99 fL (ref 80.0–100.0)
MEAN PLATELET VOLUME: 8.6 fL (ref 7.0–10.0)
MONOCYTES ABSOLUTE COUNT: 0.1 10*9/L — ABNORMAL LOW (ref 0.2–0.8)
MONOCYTES RELATIVE PERCENT: 3.4 %
NEUTROPHILS ABSOLUTE COUNT: 3.8 10*9/L (ref 2.0–7.5)
NEUTROPHILS RELATIVE PERCENT: 93.5 %
PLATELET COUNT: 301 10*9/L (ref 150–440)
RED BLOOD CELL COUNT: 2.92 10*12/L — ABNORMAL LOW (ref 4.50–5.90)
WBC ADJUSTED: 4.1 10*9/L — ABNORMAL LOW (ref 4.5–11.0)

## 2019-06-17 LAB — URINALYSIS
BILIRUBIN UA: NEGATIVE
GLUCOSE UA: NEGATIVE
KETONES UA: NEGATIVE
LEUKOCYTE ESTERASE UA: NEGATIVE
NITRITE UA: NEGATIVE
PH UA: 5 (ref 5.0–9.0)
PROTEIN UA: NEGATIVE
RBC UA: 18 /HPF — ABNORMAL HIGH (ref <=3)
SPECIFIC GRAVITY UA: 1.015 (ref 1.003–1.030)
UROBILINOGEN UA: 0.2
WBC UA: 4 /HPF — ABNORMAL HIGH (ref <=2)

## 2019-06-17 LAB — COMPREHENSIVE METABOLIC PANEL
ALBUMIN: 4.1 g/dL (ref 3.5–5.0)
ALKALINE PHOSPHATASE: 216 U/L — ABNORMAL HIGH (ref 38–126)
ALT (SGPT): 22 U/L (ref <50)
ANION GAP: 8 mmol/L (ref 7–15)
AST (SGOT): 21 U/L (ref 19–55)
BLOOD UREA NITROGEN: 26 mg/dL — ABNORMAL HIGH (ref 7–21)
BUN / CREAT RATIO: 33
CALCIUM: 12.2 mg/dL (ref 8.5–10.2)
CHLORIDE: 102 mmol/L (ref 98–107)
CO2: 23 mmol/L (ref 22.0–30.0)
CREATININE: 0.79 mg/dL (ref 0.70–1.30)
EGFR CKD-EPI AA MALE: >90 mL/min/{1.73_m2} (ref >=60)
EGFR CKD-EPI NON-AA MALE: >90 mL/min/{1.73_m2} (ref >=60)
GLUCOSE RANDOM: 114 mg/dL (ref 70–179)
PROTEIN TOTAL: 6.6 g/dL (ref 6.5–8.3)
SODIUM: 133 mmol/L — ABNORMAL LOW (ref 135–145)

## 2019-06-17 LAB — LYMPHOCYTES RELATIVE PERCENT: Lab: 0.9

## 2019-06-17 LAB — PROTEIN / CREATININE RATIO, URINE
CREATININE, URINE: 70.1 mg/dL
PROTEIN/CREAT RATIO, URINE: 0.09

## 2019-06-17 LAB — PHOSPHORUS: Phosphate:MCnc:Pt:Ser/Plas:Qn:: 2 — ABNORMAL LOW

## 2019-06-17 LAB — TACROLIMUS, TROUGH: Lab: 8.7

## 2019-06-17 LAB — PH UA: Lab: 5

## 2019-06-17 LAB — MAGNESIUM: Magnesium:MCnc:Pt:Ser/Plas:Qn:: 1.4 — ABNORMAL LOW

## 2019-06-17 LAB — CREATININE: Creatinine:MCnc:Pt:Ser/Plas:Qn:: 0.79

## 2019-06-17 LAB — PROTEIN URINE: Protein:MCnc:Pt:Urine:Qn:: 6.3

## 2019-06-17 LAB — SMEAR REVIEW

## 2019-06-17 MED ORDER — MG-PLUS-PROTEIN 133 MG TABLET
ORAL_TABLET | Freq: Two times a day (BID) | ORAL | 5 refills | 33 days | Status: CP
Start: 2019-06-17 — End: 2020-06-16
  Filled 2019-06-20: qty 200, 33d supply, fill #0

## 2019-06-17 MED ORDER — BIOTIN 5 MG CAPSULE
Freq: Every day | ORAL | 0 refills | 0 days
Start: 2019-06-17 — End: ?

## 2019-06-17 NOTE — Unmapped (Signed)
[facility] Refill Coordination Note    Specialty Medication(s) to be Shipped:   Transplant: Myfortic 180mg  and valgancyclovir 450mg   Other medication(s) to be shipped: Metoprolol Tart 25mg  & Sulfa-Trime 400-80mg      Louis Dennis, DOB: June 21, 1961  Phone: [phone] (home)     All above HIPAA information was verified with Message received from CPP Wallace Cullens Mincemoyer)     Completed refill call assessment today to schedule patient's medication shipment from the [facility] [phone]).       Specialty medication(s) and dose(s) confirmed: Regimen is correct and unchanged.   Changes to medications: [name] reports no changes reported at this time.  Changes to insurance: No  Questions for the pharmacist: No    Confirmed patient received Welcome Packet with first shipment. The patient will receive a drug information handout for each medication shipped and additional FDA Medication Guides as required.       DISEASE/MEDICATION-SPECIFIC INFORMATION        N/A    SPECIALTY MEDICATION ADHERENCE     Myfortic 180 mg: 7 days of medicine on hand (As per CPP)  Valganciclovir 450 mg: 7 days of medicine on hand (As per CPP)    SHIPPING     Shipping address confirmed in Epic.     Delivery Scheduled: Yes, Expected medication delivery date: 06/19/2019.     Medication will be delivered via UPS to the home address in [place].    [name]   [facility] Specialty Technician

## 2019-06-17 NOTE — Unmapped (Signed)
Complainsof pain to his tail bone when his bladder is full.. When he empties his bladder the pain goes away.

## 2019-06-17 NOTE — Unmapped (Signed)
[facility] Pharmacist Intervention    Type of intervention: dosage change of transplant drug    Medication: prograf/tac    Problem: new rx sent for change to 3/2    Intervention: per epic note, this decrease was communicated to patient via coordinator on 7/10    Follow up needed: will not adjust refill call date, bc even though decreased patient will have other meds scheduled for the original date set up    Approximate time spent: 10 minutes    Thad Ranger   [facility] Specialty Pharmacist

## 2019-06-17 NOTE — Unmapped (Signed)
Urine was collected and sent to the lab.

## 2019-06-17 NOTE — Unmapped (Signed)
[facility] PHARMACY NOTE  06/17/2019   Louis Dennis  [id number]    Medication changes today:   1. Increase mg plus protein to 399 mg BID   2. Start biotin 5 mg daily    Education/Adherence tools provided today:  1.provided updated medication list  2. provided additional pill box education  3.  provided additional education on immunosuppression and transplant related medications including reviewing indications of medications, dosing and side effects    Follow up items:  1. goal of understanding indications and dosing of immunosuppression medications  2. Pill box for accuracy  3. Consider starting statin at a later date    Next visit with pharmacy in 1 month  ____________________________________________________________________    Louis Dennis is a 58 y.o. male s/p deceased kidney transplant on June 22, 2019 (Kidney) 2/2 HTN.     Other PMH significant for hypertension, gout    Seen by pharmacy today for: medication management and pill box fill and adherence education; last seen by pharmacy 2 weeks ago     CC:  Patient complains of low back pain and hair thinning    There were no vitals filed for this visit.    No Known Allergies    All medications reviewed and updated.     Medication list includes revisions made during today???s encounter    Outpatient Encounter Medications as of 06/17/2019   Medication Sig Dispense Refill   ??? acetaminophen (TYLENOL) 500 MG tablet Take 1-2 tablets (500-1,000 mg total) by mouth every six (6) hours as needed for pain. 100 tablet 0   ??? allopurinol (ZYLOPRIM) 100 MG tablet Take 100 mg by mouth daily.     ??? aspirin (ECOTRIN) 81 MG tablet Take 1 tablet (81 mg total) by mouth daily. 30 tablet 11   ??? [EXPIRED] cephalexin (KEFLEX) 500 MG capsule Take 1 capsule (500 mg total) by mouth Three (3) times a day for 7 days. 21 capsule 0   ??? cinacalcet (SENSIPAR) 60 MG tablet Take 2 tablets (120 mg total) by mouth daily. 60 tablet 11   ??? docusate sodium (COLACE) 100 MG capsule Take 1 capsule (100 mg total) by mouth two (2) times a day as needed for constipation. (Patient not taking: Reported on 06/03/2019) 60 capsule 0   ??? gabapentin (NEURONTIN) 100 MG capsule Take 1 capsule (100 mg total) by mouth nightly for 11 days. 11 capsule 0   ??? [EXPIRED] linezolid (ZYVOX) 600 mg tablet Take 1 tablet (600 mg total) by mouth Two (2) times a day for 11 days. (Patient not taking: Reported on 06/03/2019) 22 tablet 0   ??? magnesium oxide-Mg AA chelate (MAGNESIUM, AMINO ACID CHELATE,) 133 mg Tab Take 2 tablets by mouth Two (2) times a day. 120 tablet 11   ??? metoprolol tartrate (LOPRESSOR) 25 MG tablet Take 1 tablet (25 mg total) by mouth Two (2) times a day. 60 tablet 11   ??? MYFORTIC 180 mg EC tablet Take 3 tablets (540 mg total) by mouth Two (2) times a day. 180 tablet 11   ??? omeprazole 20 mg tablet Take 1 tablet (20 mg total) by mouth daily as needed. 30 tablet 11   ??? polyethylene glycol (MIRALAX) 17 gram packet Mix 1 packet (17 g) in 4 to 8 ounces of liquid and take by mouth daily as needed. (Patient not taking: Reported on 06/03/2019) 30 each 0   ??? PROGRAF 1 mg capsule Take by mouth 3mg  (3 capsules) in the morning and 2mg  (2 capsules)  at night. 150 capsule 11   ??? sulfamethoxazole-trimethoprim (BACTRIM) 400-80 mg per tablet Take 1 tablet (80 mg of trimethoprim total) by mouth Every Monday, Wednesday, and Friday. 12 tablet 5   ??? tacrolimus (PROGRAF) 1 MG capsule Take 2 capsules (2 mg total) by mouth two (2) times a day. 120 capsule 11   ??? traMADoL (ULTRAM) 50 mg tablet Take 1-2 tablets (50-100 mg total) by mouth two (2) times a day as needed for pain (Patient not taking: Reported on 06/03/2019) 40 tablet 0   ??? valGANciclovir (VALCYTE) 450 mg tablet Take 1 tablet (450 mg total) by mouth daily. 30 tablet 2     No facility-administered encounter medications on file as of 06/17/2019.      Induction agent : alemtuzumab    CURRENT IMMUNOSUPPRESSION: tacrolimus 3 mg qAM and 2 mg qPM  prograf/Envarsus/cyclosporine goal: 8-10   myfortic540  mg PO bid    steroid free     Patient is tolerating immunosuppression well    IMMUNOSUPPRESSION DRUG LEVELS:  Lab Results   Component Value Date    Tacrolimus, Trough 5.3 05/27/2019    Tacrolimus, Trough 4.3 (L) 05/26/2019    Tacrolimus, Trough 6.6 05/25/2019    Tacrolimus, Timed 6.1 06/14/2019    Tacrolimus, Timed 7.1 06/12/2019    Tacrolimus, Timed 11.2 06/10/2019     No results found for: CYCLO  No results found for: EVEROLIMUS  No results found for: SIROLIMUS    Prograf level is accurate 12 hour trough    Graft function: stable   DSA: pending  Biopsies to date: ntd  WBC/ANC:  wnl    Plan: Will maintain current immunosuppression. Continue to monitor.    OI Prophylaxis:   CMV Status: D+/ R+, moderate risk . CMV prophylaxis: valganciclovir 450 mg daily x 3 months per protocol.  No results found for: CMVCP  PCP Prophylaxis: bactrim SS 1 tab MWF x 6 months.  K pneumo UTI 6/29: completed 7d of cephalexin  Thrush: completed in hospital   Donor positive BCx s. [place]: dapto inpatient - linezolid 600 mg BID outpt thru 7/3 complete  Patient is  tolerating infectious prophylaxis well    Plan: Continue per protocol. Continue to monitor.    CV Prophylaxis: asa 81 mg   The 10-year ASCVD risk score Denman George DC Jr., et al., 2013) is: 7.6%  Statin therapy: Indicated; currently on no statin  Plan: consider starting statin at a later date. Continue to monitor     BP/history of paroxysmal AF: Goal < 140/90. Clinic vitals reported above  Home BP ranges:110s-120/70s  Current meds include: metoprolol 25 mg BID  Plan: within goal.  If gets lightheaded can consider decreasing MTP to 12.5 mg BID. Continue to monitor    Anemia of CKD:  H/H:   Lab Results   Component Value Date    HGB 9.3 (L) 06/14/2019     Lab Results   Component Value Date    HCT 28.1 (L) 06/14/2019     Iron panel:  No results found for: IRON, TIBC, FERRITIN  No results found for: LABIRON    Prior ESA use: none post transplant    Plan: within goal. Continue to monitor.     DM:   Lab Results   Component Value Date    A1C 5.0 04/02/2019   . Goal A1c < 7  History of Dm? No  Established with endocrinologist/PCP for BG managment? No  Currently on: no medications  Home BS log:not checking but  FBG on labs have been in 110s  Diet: did not address specifics but patient reports to eating well  Exercise:not yet  Fluid intake: ~2L daily  Plan:  Continue to monitor    Electrolytes: K 5.4, calcium 12.2  Meds currently on: Sensipar 120 mg daily, mg plus protein 266 mg BID   Plan: Increase mg plus protein to 399 mg BID.  Monitor calcium.  Encouraged low K+ containing diet.  Continue to monitor     GI/BM: pt reports no diarrhea or constipation  Meds currently on: docusate and Miralax PRN (not using)  Plan: Continue to monitor. Continue to monitor    Pain: pt reports moderate low back pain near tailbone - says it's worst when bladder is full  Meds currently on: APAP PRN, tramadol PRN (not using), omeprazole 20 mg PRN (using every night)  Plan: Remove tramadol from list. Continue to monitor    Bone health:   Vitamin D Level: none available. Goal > 30.   Last DEXA results:  none available  Current meds include: Sensipar 120 mg daily  Plan: Vitamin D level  needs to be drawn with next lab schedule. Continue to monitor.     Women's/Men's Health:  Kayne Yuhas is a 58 y.o. male. Patient reports no men's/women's health issues  Plan: Continue to monitor    Gout - no recent flares  Medications currently on: allopurinol 100 mg daily )  Plan: continue to monitor    Adherence: Patient has poor understanding of medications; was not able to independently identify names/doses of immunosuppressants and OI meds.  Patient  does not fill their own pill box on a regular basis at home.  His wife manages his medications and was not present at today's visit. His son, Jonny Ruiz, accompanied him.   Patient brought medication card:yes  Pill box:was correct   Plan: Encouraged patient to learn more about medications; provided extensive adherence counseling/intervention    Spent approximately 30 minutes on educating this patient and greater than 50% was spent in direct face to face counseling regarding post transplant medication education. Questions and concerns were address to patient's satisfaction.    Patient was reviewed with Dr. Rush Barer who was agreement with the stated plan:     During this visit, the following was completed:   BG log data assessment  BP log data assessment  Labs ordered and evaluated  complex treatment plan >1 DS   Patient education was completed for 11-24 minutes     All questions/concerns were addressed to the patient's satisfaction.  __________________________________________  Cleone Slim, PHARMD, CPP  [facility] [employer] [facility]PL[facility] CLINICAL PHARMACIST PRACTITIONER  PAGER [phone]

## 2019-06-17 NOTE — Unmapped (Signed)
Change in Prograf dosage decrease. Co-pay $0.00.

## 2019-06-17 NOTE — Unmapped (Signed)
TRANSPLANT SURGERY PROGRESS NOTE    Assessment and Plan  Graceson Nichelson is a 58 y.o. 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 He is doing well post-operatively, his energy and appetite are improving. His is voiding frequently (> 2L urine/day) with a stable Cr (0.79). His blood pressures have improved since decreasing his amlodipine (SBP 120s) with infrequent episodes of lightheadedness.     - We will coordinate HD catheter removal  - Continue to monitor for scrotal swelling or pain  - Follow up with Nephrology in 2 weeks (7/30)    Emilio Aspen, MD  [facility], PGY3    Subjective  Keyvon Herter is a 58 y.o. 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    He has been doing well since he was last seen in clinic on 6/29 at which time his drain was removed. At that time his amlodipine had been decreased due to intermittent low blood pressures and dizzy spells. Since that time his blood pressures have improved (SBP 120s) with infrequent episodes of lightheadedness. His pain is improved, taking tylenol intermittently as needed. He is voiding well > 2L per day. His appetite and energy have improved and he is able to walk an hour per day.     He notes his left testicle is slightly higher then his right but denies pain or swelling and states this improves throughout the day. He would like his HD catheter removed now that he is no longer requiring dialysis.     Denies fevers, chills, chest pain or shortness of breath.     Objective    Vitals:    06/17/19 0911   BP: 139/73   Pulse: 69   Temp: 36.6 ??C   TempSrc: Tympanic   SpO2: 100%   Weight: 60.1 kg (132 lb 8 oz)   Height: 167.6 cm (5' 6)      Body mass index is 21.39 kg/m??.      Physical Exam:    General: Well appearing male, well-nourished, resting comfortably in no acute distress  Eyes: Sclera anicteric, EOM intact  ENT: Nares without discharge, moist mucous membranes, trachea midline   Cardiac: Regular rate and rhythm, no appreciable murmurs   Pulmonary: Non labored breathing, stable on room air, lungs clear to auscultation bilaterally, Right HD catheter in place w/ dressing intact  Abdomen: Soft, non-tender, non distended, RLQ incision healing well with staples in place, no erythema or drainage  Extremities: Warm and well perfused, no edema bilaterally   Neuro: Alert and oriented x3      Data Review:  All lab results last 24 hours:    Recent Results (from the past 24 hour(s))   Comprehensive Metabolic Panel    Collection Time: 06/17/19  8:53 AM   Result Value Ref Range    Sodium 133 (L) 135 - 145 mmol/L    Potassium 5.4 (H) 3.5 - 5.0 mmol/L    Chloride 102 98 - 107 mmol/L    Anion Gap 8 7 - 15 mmol/L    CO2 23.0 22.0 - 30.0 mmol/L    BUN 26 (H) 7 - 21 mg/dL    Creatinine 4.09 8.11 - 1.30 mg/dL    BUN/Creatinine Ratio 33     EGFR CKD-EPI Non-African American, Male >90 >=60 mL/min/1.70m2    EGFR CKD-EPI African American, Male >90 >=60 mL/min/1.72m2    Glucose 114 70 -  179 mg/dL    Calcium 16.1 (HH) 8.5 - 10.2 mg/dL    Albumin 4.1 3.5 - 5.0 g/dL    Total Protein 6.6 6.5 - 8.3 g/dL    Total Bilirubin 0.2 0.0 - 1.2 mg/dL    AST 21 19 - 55 U/L    ALT 22 <50 U/L    Alkaline Phosphatase 216 (H) 38 - 126 U/L   Magnesium Level    Collection Time: 06/17/19  8:53 AM   Result Value Ref Range    Magnesium 1.4 (L) 1.6 - 2.2 mg/dL   Phosphorus Level    Collection Time: 06/17/19  8:53 AM   Result Value Ref Range    Phosphorus 2.0 (L) 2.9 - 4.7 mg/dL   CBC w/ Differential    Collection Time: 06/17/19  8:53 AM   Result Value Ref Range    WBC 4.1 (L) 4.5 - 11.0 10*9/L    RBC 2.92 (L) 4.50 - 5.90 10*12/L    HGB 9.2 (L) 13.5 - 17.5 g/dL    HCT 09.6 (L) 04.5 - 53.0 %    MCV 99.0 80.0 - 100.0 fL    MCH 31.4 26.0 - 34.0 pg    MCHC 31.7 31.0 - 37.0 g/dL    RDW 40.9 (H) 81.1 - 15.0 %    MPV 8.6 7.0 - 10.0 fL    Platelet 301 150 - 440 10*9/L    Neutrophils % 93.5 %    Lymphocytes % 0.9 %    Monocytes % 3.4 %    Eosinophils % 1.2 %    Basophils % 0.7 %    Absolute Neutrophils 3.8 2.0 - 7.5 10*9/L    Absolute Lymphocytes 0.0 (L) 1.5 - 5.0 10*9/L    Absolute Monocytes 0.1 (L) 0.2 - 0.8 10*9/L    Absolute Eosinophils 0.1 0.0 - 0.4 10*9/L    Absolute Basophils 0.0 0.0 - 0.1 10*9/L    Large Unstained Cells 0 0 - 4 %    Macrocytosis Marked (A) Not Present    Anisocytosis Moderate (A) Not Present    Hypochromasia Slight (A) Not Present         Imaging: None

## 2019-06-18 LAB — GLUCOSE-6-PHOSPHATE DEHYDROGENASE QUAL: Lab: 9.6

## 2019-06-18 NOTE — Unmapped (Signed)
[facility] Shared [facility] Clinical Assessment & Refill Coordination Note    Kashton Mcartor, : 1961-05-30  Phone: [phone] (home)     All above HIPAA information was verified with patient.     Specialty Medication(s):   Transplant: tacrolimus 1mg      Current Outpatient Medications   Medication Sig Dispense Refill   ??? acetaminophen (TYLENOL) 500 MG tablet Take 1-2 tablets (500-1,000 mg total) by mouth every six (6) hours as needed for pain. 100 tablet 0   ??? allopurinol (ZYLOPRIM) 100 MG tablet Take 100 mg by mouth daily.     ??? aspirin (ECOTRIN) 81 MG tablet Take 1 tablet (81 mg total) by mouth daily. 30 tablet 11   ??? biotin 5 mg cap Take 1 capsule (5,000 mcg total) by mouth daily.  0   ??? cinacalcet (SENSIPAR) 60 MG tablet Take 2 tablets (120 mg total) by mouth daily. 60 tablet 11   ??? docusate sodium (COLACE) 100 MG capsule Take 1 capsule (100 mg total) by mouth two (2) times a day as needed for constipation. (Patient not taking: Reported on 06/03/2019) 60 capsule 0   ??? magnesium oxide-Mg AA chelate (MAGNESIUM, AMINO ACID CHELATE,) 133 mg Tab Take 3 tablets by mouth Two (2) times a day. 200 tablet 5   ??? metoprolol tartrate (LOPRESSOR) 25 MG tablet Take 1 tablet (25 mg total) by mouth Two (2) times a day. 60 tablet 11   ??? MYFORTIC 180 mg EC tablet Take 3 tablets (540 mg total) by mouth Two (2) times a day. 180 tablet 11   ??? omeprazole 20 mg tablet Take 1 tablet (20 mg total) by mouth daily as needed. 30 tablet 11   ??? sulfamethoxazole-trimethoprim (BACTRIM) 400-80 mg per tablet Take 1 tablet (80 mg of trimethoprim total) by mouth Every Monday, Wednesday, and Friday. 12 tablet 5   ??? tacrolimus (PROGRAF) 1 MG capsule Take 3 capsules (3 mg) by mouth in the morning and 2 capsules (2 mg) at night 150 capsule 11   ??? valGANciclovir (VALCYTE) 450 mg tablet Take 1 tablet (450 mg total) by mouth daily. 30 tablet 2     No current facility-administered medications for this visit.         Changes to medications: Mag ox increased to 3 tabs twice daily       No Known Allergies    Changes to allergies: No    SPECIALTY MEDICATION ADHERENCE     Tacrolimus 1 mg: 10 days of medicine on hand         Specialty medication(s) dose(s) confirmed: Patient reports changes to the regimen as follows: decrease to 3 mg in am and 2 mg in pm     Are there any concerns with adherence? No    Adherence counseling provided? Not needed    CLINICAL MANAGEMENT AND INTERVENTION      Clinical Benefit Assessment:    Do you feel the medicine is effective or helping your condition? Yes    Clinical Benefit counseling provided? Not needed    Adverse Effects Assessment:    Are you experiencing any side effects? No    Are you experiencing difficulty administering your medicine? No    Quality of Life Assessment:    How many days over the past month did your transplant  keep you from your normal activities? For example, brushing your teeth or getting up in the morning. 0    Have you discussed this with your provider? Not needed  Therapy Appropriateness:    Is therapy appropriate? Yes, therapy is appropriate and should be continued    DISEASE/MEDICATION-SPECIFIC INFORMATION      N/A    PATIENT SPECIFIC NEEDS     ? Does the patient have any physical, cognitive, or cultural barriers? No    ? Is the patient high risk? No     ? Does the patient require a Care Management Plan? No     ? Does the patient require physician intervention or other additional services (i.e. nutrition, smoking cessation, social work)? No      SHIPPING     Specialty Medication(s) to be Shipped:   Transplant: tacrolimus 1mg     Other medication(s) to be shipped: Mag Ox     Changes to insurance: No    Delivery Scheduled: Yes, Expected medication delivery date: 06/19/19.     Medication will be delivered via UPS to the confirmed home address in [facility].    The patient will receive a drug information handout for each medication shipped and additional FDA Medication Guides as required.  Verified that patient has previously received a Conservation officer, historic buildings.    All of the patient's questions and concerns have been addressed.    Layne Lebon [name]   [facility] Specialty Pharmacist

## 2019-06-18 NOTE — Unmapped (Signed)
Rescheduled order for 7/17 delivery via UPS per RMP

## 2019-06-19 LAB — HLA DS POST TRANSPLANT
ANTI-DONOR DRW #1 MFI: 152 MFI
ANTI-DONOR DRW #2 MFI: 196 MFI
ANTI-DONOR HLA-A #1 MFI: 0 MFI
ANTI-DONOR HLA-A #2 MFI: 0 MFI
ANTI-DONOR HLA-B #2 MFI: 0 MFI
ANTI-DONOR HLA-C #1 MFI: 0 MFI
ANTI-DONOR HLA-C #2 MFI: 0 MFI
ANTI-DONOR HLA-DQB #1 MFI: 148 MFI
ANTI-DONOR HLA-DQB #2 MFI: 181 MFI
ANTI-DONOR HLA-DR #1 MFI: 62 MFI
ANTI-DONOR HLA-DR #2 MFI: 132 MFI

## 2019-06-19 LAB — HLA CL2 AB COMMENT: Lab: 0

## 2019-06-19 LAB — HLA CLASS 1 ANTIBODY RESULT: Lab: POSITIVE

## 2019-06-19 LAB — FSAB CLASS 1 ANTIBODY SPECIFICITY

## 2019-06-19 LAB — DONOR HLA-C ANTIGEN #1

## 2019-06-19 NOTE — Unmapped (Signed)
Per Dr. Rush Barer, ok to have RIJ HD Catheter removed. Pt scheduled for 06/24/2019 at 7:30 am. Pt will also get labs that morning after procedure. Pt made aware, verbalized understanding.

## 2019-06-20 ENCOUNTER — Ambulatory Visit: Admit: 2019-06-20 | Discharge: 2019-06-21 | Payer: MEDICARE

## 2019-06-20 DIAGNOSIS — D899 Disorder involving the immune mechanism, unspecified: Secondary | ICD-10-CM

## 2019-06-20 DIAGNOSIS — Z940 Kidney transplant status: Principal | ICD-10-CM

## 2019-06-20 LAB — CBC W/ AUTO DIFF
BASOPHILS ABSOLUTE COUNT: 0 10*9/L (ref 0.0–0.1)
BASOPHILS RELATIVE PERCENT: 0.5 %
EOSINOPHILS ABSOLUTE COUNT: 0.1 10*9/L (ref 0.0–0.4)
HEMATOCRIT: 30.2 % — ABNORMAL LOW (ref 41.0–53.0)
HEMOGLOBIN: 10 g/dL — ABNORMAL LOW (ref 13.5–17.5)
LARGE UNSTAINED CELLS: 1 % (ref 0–4)
LYMPHOCYTES RELATIVE PERCENT: 1.2 %
MEAN CORPUSCULAR HEMOGLOBIN CONC: 33.2 g/dL (ref 31.0–37.0)
MEAN CORPUSCULAR HEMOGLOBIN: 33 pg (ref 26.0–34.0)
MEAN CORPUSCULAR VOLUME: 99.5 fL (ref 80.0–100.0)
MEAN PLATELET VOLUME: 7.1 fL (ref 7.0–10.0)
MONOCYTES RELATIVE PERCENT: 3.2 %
NEUTROPHILS ABSOLUTE COUNT: 3.7 10*9/L (ref 2.0–7.5)
NEUTROPHILS RELATIVE PERCENT: 92.3 %
RED BLOOD CELL COUNT: 3.04 10*12/L — ABNORMAL LOW (ref 4.50–5.90)
RED CELL DISTRIBUTION WIDTH: 18.8 % — ABNORMAL HIGH (ref 12.0–15.0)
WBC ADJUSTED: 4 10*9/L — ABNORMAL LOW (ref 4.5–11.0)

## 2019-06-20 LAB — MONOCYTES RELATIVE PERCENT: Lab: 3.2

## 2019-06-20 LAB — GLUCOSE RANDOM: Glucose:MCnc:Pt:Ser/Plas:Qn:: 110

## 2019-06-20 LAB — RENAL FUNCTION PANEL
ALBUMIN: 4.4 g/dL (ref 3.5–5.0)
ANION GAP: 9 mmol/L (ref 7–15)
BUN / CREAT RATIO: 31
CALCIUM: 12.9 mg/dL (ref 8.5–10.2)
CHLORIDE: 105 mmol/L (ref 98–107)
CREATININE: 0.78 mg/dL (ref 0.70–1.30)
EGFR CKD-EPI AA MALE: >90 mL/min/{1.73_m2} (ref >=60)
EGFR CKD-EPI NON-AA MALE: >90 mL/min/{1.73_m2} (ref >=60)
GLUCOSE RANDOM: 110 mg/dL (ref 70–179)
PHOSPHORUS: 2 mg/dL — ABNORMAL LOW (ref 2.9–4.7)
POTASSIUM: 5.4 mmol/L — ABNORMAL HIGH (ref 3.5–5.0)
SODIUM: 137 mmol/L (ref 135–145)

## 2019-06-20 LAB — CMV DNA, QUANTITATIVE, PCR: CMV QUANT: <50 [IU]/mL — ABNORMAL HIGH (ref <0)

## 2019-06-20 LAB — TACROLIMUS BLOOD: Lab: 7.9

## 2019-06-20 LAB — CMV VIRAL LD: Lab: DETECTED — AB

## 2019-06-20 LAB — MAGNESIUM: Magnesium:MCnc:Pt:Ser/Plas:Qn:: 1.4 — ABNORMAL LOW

## 2019-06-20 MED FILL — MG-PLUS-PROTEIN 133 MG TABLET: 33 days supply | Qty: 200 | Fill #0 | Status: AC

## 2019-06-20 MED FILL — METOPROLOL TARTRATE 25 MG TABLET: ORAL | 30 days supply | Qty: 60 | Fill #0

## 2019-06-20 MED FILL — SULFAMETHOXAZOLE 400 MG-TRIMETHOPRIM 80 MG TABLET: ORAL | 28 days supply | Qty: 12 | Fill #0

## 2019-06-20 MED FILL — MYFORTIC 180 MG TABLET,DELAYED RELEASE: ORAL | 30 days supply | Qty: 180 | Fill #0

## 2019-06-20 MED FILL — TACROLIMUS 1 MG CAPSULE: 30 days supply | Qty: 150 | Fill #0 | Status: AC

## 2019-06-20 MED FILL — VALGANCICLOVIR 450 MG TABLET: 30 days supply | Qty: 30 | Fill #0 | Status: AC

## 2019-06-20 MED FILL — VALGANCICLOVIR 450 MG TABLET: ORAL | 30 days supply | Qty: 30 | Fill #0

## 2019-06-20 MED FILL — SULFAMETHOXAZOLE 400 MG-TRIMETHOPRIM 80 MG TABLET: 28 days supply | Qty: 12 | Fill #0 | Status: AC

## 2019-06-20 MED FILL — MYFORTIC 180 MG TABLET,DELAYED RELEASE: 30 days supply | Qty: 180 | Fill #0 | Status: AC

## 2019-06-20 MED FILL — METOPROLOL TARTRATE 25 MG TABLET: 30 days supply | Qty: 60 | Fill #0 | Status: AC

## 2019-06-20 NOTE — Unmapped (Signed)
Error entry

## 2019-06-24 ENCOUNTER — Ambulatory Visit: Admit: 2019-06-24 | Discharge: 2019-06-24 | Payer: MEDICARE

## 2019-06-24 DIAGNOSIS — D899 Disorder involving the immune mechanism, unspecified: Secondary | ICD-10-CM

## 2019-06-24 DIAGNOSIS — Z940 Kidney transplant status: Principal | ICD-10-CM

## 2019-06-24 LAB — CBC W/ AUTO DIFF
BASOPHILS ABSOLUTE COUNT: 0 10*9/L (ref 0.0–0.1)
EOSINOPHILS ABSOLUTE COUNT: 0.1 10*9/L (ref 0.0–0.4)
EOSINOPHILS RELATIVE PERCENT: 2.2 %
HEMATOCRIT: 32.9 % — ABNORMAL LOW (ref 41.0–53.0)
HEMOGLOBIN: 10.8 g/dL — ABNORMAL LOW (ref 13.5–17.5)
LARGE UNSTAINED CELLS: 1 % (ref 0–4)
LYMPHOCYTES ABSOLUTE COUNT: 0.1 10*9/L — ABNORMAL LOW (ref 1.5–5.0)
LYMPHOCYTES RELATIVE PERCENT: 1.3 %
MEAN CORPUSCULAR HEMOGLOBIN CONC: 32.8 g/dL (ref 31.0–37.0)
MEAN CORPUSCULAR HEMOGLOBIN: 33 pg (ref 26.0–34.0)
MEAN PLATELET VOLUME: 7.5 fL (ref 7.0–10.0)
MONOCYTES ABSOLUTE COUNT: 0.2 10*9/L (ref 0.2–0.8)
MONOCYTES RELATIVE PERCENT: 4 %
NEUTROPHILS ABSOLUTE COUNT: 5.1 10*9/L (ref 2.0–7.5)
NEUTROPHILS RELATIVE PERCENT: 91.5 %
PLATELET COUNT: 344 10*9/L (ref 150–440)
RED BLOOD CELL COUNT: 3.28 10*12/L — ABNORMAL LOW (ref 4.50–5.90)
RED CELL DISTRIBUTION WIDTH: 18.6 % — ABNORMAL HIGH (ref 12.0–15.0)

## 2019-06-24 LAB — RENAL FUNCTION PANEL
ALBUMIN: 4.2 g/dL (ref 3.5–5.0)
ANION GAP: 11 mmol/L (ref 7–15)
BLOOD UREA NITROGEN: 25 mg/dL — ABNORMAL HIGH (ref 7–21)
BUN / CREAT RATIO: 33
CALCIUM: 12.9 mg/dL (ref 8.5–10.2)
CHLORIDE: 99 mmol/L (ref 98–107)
CO2: 24 mmol/L (ref 22.0–30.0)
CREATININE: 0.75 mg/dL (ref 0.70–1.30)
EGFR CKD-EPI AA MALE: >90 mL/min/{1.73_m2} (ref >=60)
EGFR CKD-EPI NON-AA MALE: >90 mL/min/{1.73_m2} (ref >=60)
GLUCOSE RANDOM: 118 mg/dL (ref 70–179)
PHOSPHORUS: 2.3 mg/dL — ABNORMAL LOW (ref 2.9–4.7)
SODIUM: 134 mmol/L — ABNORMAL LOW (ref 135–145)

## 2019-06-24 LAB — MAGNESIUM: Magnesium:MCnc:Pt:Ser/Plas:Qn:: 1.3 — ABNORMAL LOW

## 2019-06-24 LAB — POTASSIUM: Potassium:SCnc:Pt:Ser/Plas:Qn:: 5.7 — ABNORMAL HIGH

## 2019-06-24 LAB — TACROLIMUS BLOOD: Lab: 9.3

## 2019-06-24 LAB — RED CELL DISTRIBUTION WIDTH: Lab: 18.6 — ABNORMAL HIGH

## 2019-06-24 NOTE — Unmapped (Signed)
VIR Post-Procedure Note    Procedure Name: Central Venous Catheter (Tunneled HD Catheter) Removal    Indication: No longer needed    Pre-Op Diagnosis: ESRD, Kidney transplant  Post-Op Diagnosis: Same as pre-operative diagnosis    VIR Providers (Attending Physician): Ammie Dalton, MD    Time out: Prior to the procedure, a time out was performed with all team members present. During the time out, the patient, procedure and procedure site when applicable were verbally verified by the team members and Dr. Ammie Dalton.    Description of procedure: Successful removal of tunneled HD Catheter from the internal jugular vein with no complication.    Plan: Wound care as instructed.    Sedation: None    Estimated Blood Loss: less than 5 mL  Complications: None    See detailed procedure note with images in PACS [facility]).    The patient tolerated the procedure well without incident or complication.    Interventional Radiologist: Ammie Dalton, MD  Date/Time: 06/24/2019 8:11 AM

## 2019-06-24 NOTE — Unmapped (Signed)
[facility] INTERVENTIONAL RADIOLOGY - Pre Procedure H/P     CC: No chief complaint on file.    Diagnosis: ESRD, kidney transplant     Assessment Plan and HPI: Louis Dennis is a 58 y.o. male who will undergo tunneled HD catheter removal in Interventional Radiology.    Allergies: No Known Allergies    Recent Labs:   WBC   Date Value Ref Range Status   06/20/2019 4.0 (L) 4.5 - 11.0 10*9/L Final   07/10/2013 9.4 4.5 - 11.0 10*9/L Final     HGB   Date Value Ref Range Status   06/20/2019 10.0 (L) 13.5 - 17.5 g/dL Final   16/09/9603 54.0 (L) 13.5 - 17.5 g/dL Final     Hgb, blood gas   Date Value Ref Range Status   01/18/2012 12.0 (L) 13.5 - 17.5 G/DL Final     HCT   Date Value Ref Range Status   06/20/2019 30.2 (L) 41.0 - 53.0 % Final   07/10/2013 38.5 (L) 41.0 - 53.0 % Final     Platelet   Date Value Ref Range Status   06/20/2019 346 150 - 440 10*9/L Final   07/10/2013 226 150 - 440 10*9/L Final     INR   Date Value Ref Range Status   05/25/2019 1.02  Final   07/10/2013 1.0  Final     Total Bilirubin   Date Value Ref Range Status   06/17/2019 0.2 0.0 - 1.2 mg/dL Final   98/10/9146 0.5 0.0 - 1.2 mg/dL Final     Creatinine   Date Value Ref Range Status   06/20/2019 0.78 0.70 - 1.30 mg/dL Final   82/95/6213 08.65 (H) 0.70 - 1.30 mg/dL Final     AST   Date Value Ref Range Status   06/17/2019 21 19 - 55 U/L Final   07/10/2013 22 19 - 55 U/L Final       Medications:   Current Facility-Administered Medications   Medication Dose Route Frequency Provider Last Rate Last Dose   ??? lidocaine (PF) (XYLOCAINE-MPF) 10 mg/mL (1 %) injection              Current Outpatient Medications   Medication Sig Dispense Refill   ??? acetaminophen (TYLENOL) 500 MG tablet Take 1-2 tablets (500-1,000 mg total) by mouth every six (6) hours as needed for pain. 100 tablet 0   ??? allopurinol (ZYLOPRIM) 100 MG tablet Take 100 mg by mouth daily.     ??? aspirin (ECOTRIN) 81 MG tablet Take 1 tablet (81 mg total) by mouth daily. 30 tablet 11   ??? biotin 5 mg cap Take 1 capsule (5,000 mcg total) by mouth daily.  0   ??? cinacalcet (SENSIPAR) 60 MG tablet Take 2 tablets (120 mg total) by mouth daily. 60 tablet 11   ??? docusate sodium (COLACE) 100 MG capsule Take 1 capsule (100 mg total) by mouth two (2) times a day as needed for constipation. (Patient not taking: Reported on 06/03/2019) 60 capsule 0   ??? magnesium oxide-Mg AA chelate (MAGNESIUM, AMINO ACID CHELATE,) 133 mg Tab Take 3 tablets by mouth Two (2) times a day. 200 tablet 5   ??? metoprolol tartrate (LOPRESSOR) 25 MG tablet Take 1 tablet (25 mg total) by mouth Two (2) times a day. 60 tablet 11   ??? MYFORTIC 180 mg EC tablet Take 3 tablets (540 mg total) by mouth Two (2) times a day. 180 tablet 11   ??? omeprazole 20 mg tablet Take  1 tablet (20 mg total) by mouth daily as needed. 30 tablet 11   ??? sulfamethoxazole-trimethoprim (BACTRIM) 400-80 mg per tablet Take 1 tablet (80 mg of trimethoprim total) by mouth Every Monday, Wednesday, and Friday. 12 tablet 5   ??? tacrolimus (PROGRAF) 1 MG capsule Take 3 capsules (3 mg) by mouth in the morning and 2 capsules (2 mg) at night 150 capsule 11   ??? valGANciclovir (VALCYTE) 450 mg tablet Take 1 tablet (450 mg total) by mouth daily. 30 tablet 2       PMH:   Past Medical History:   Diagnosis Date   ??? Abnormal thyroid function test    ??? End stage renal disease (CMS-HCC)     now on peritoneal dialysis   ??? Gout     reports was in Right hand, foot and left elbow   ??? Hypertension     takes meds intermitttently based on readings   ??? Nephrolithiasis 2014       ASA Grade: ASA 2 - Patient with mild systemic disease with no functional limitations    PE:    There were no vitals filed for this visit.    General: NAD, AAO x 3 male in NAD.  Airway assessment: Class 2 - Can visualize soft palate and fauces, tip of uvula is obscured  Lungs: Respirations non-labored    CONSENT: This procedure has been fully reviewed with the patient/patient???s authorized representative. The risks, benefits and alternatives have been explained, and the patient/patient???s authorized representative has consented to the procedure.     The patient will accept blood products in an emergent situation.       Interventional Radiologist: Ammie Dalton, MD  Date/Time: 06/24/2019 8:10 AM

## 2019-06-25 LAB — CALCIUM: Calcium:MCnc:Pt:Ser/Plas:Qn:: 12.5

## 2019-06-25 LAB — CMV DNA, QUANTITATIVE, PCR: CMV QUANT: <50 [IU]/mL — ABNORMAL HIGH (ref <0)

## 2019-06-25 LAB — CMV QUANT LOG10: Lab: 0

## 2019-06-25 MED ORDER — CINACALCET 60 MG TABLET
ORAL_TABLET | Freq: Every day | ORAL | 11 refills | 30 days | Status: CP
Start: 2019-06-25 — End: 2020-06-24
  Filled 2019-07-08: qty 90, 30d supply, fill #0

## 2019-06-25 NOTE — Unmapped (Signed)
Reviewed Calcium with L. Mincemoyer CPP, increasing Sensipar to 180mg  daily. Pt made aware using [employer] Tedra Coupe [phone]), verbalized understanding. Updated prescription sent to [facility].

## 2019-06-25 NOTE — Unmapped (Signed)
Lab order entered.

## 2019-06-25 NOTE — Unmapped (Signed)
Lab called with critical on Ca of 12.5, sent to [facility]

## 2019-06-27 ENCOUNTER — Ambulatory Visit: Admit: 2019-06-27 | Discharge: 2019-06-28 | Payer: MEDICARE

## 2019-06-27 DIAGNOSIS — Z940 Kidney transplant status: Principal | ICD-10-CM

## 2019-06-27 DIAGNOSIS — D899 Disorder involving the immune mechanism, unspecified: Secondary | ICD-10-CM

## 2019-06-27 LAB — RENAL FUNCTION PANEL
ALBUMIN: 4.5 g/dL (ref 3.5–5.0)
ANION GAP: 10 mmol/L (ref 7–15)
BLOOD UREA NITROGEN: 27 mg/dL — ABNORMAL HIGH (ref 7–21)
BUN / CREAT RATIO: 28
CALCIUM: 12.6 mg/dL (ref 8.5–10.2)
CHLORIDE: 104 mmol/L (ref 98–107)
EGFR CKD-EPI AA MALE: >90 mL/min/{1.73_m2} (ref >=60)
EGFR CKD-EPI NON-AA MALE: 85 mL/min/{1.73_m2} (ref >=60)
GLUCOSE RANDOM: 113 mg/dL (ref 70–179)
PHOSPHORUS: 2.2 mg/dL — ABNORMAL LOW (ref 2.9–4.7)
POTASSIUM: 5.3 mmol/L — ABNORMAL HIGH (ref 3.5–5.0)
SODIUM: 137 mmol/L (ref 135–145)

## 2019-06-27 LAB — CBC W/ AUTO DIFF
BASOPHILS ABSOLUTE COUNT: 0 10*9/L (ref 0.0–0.1)
BASOPHILS RELATIVE PERCENT: 0.6 %
EOSINOPHILS ABSOLUTE COUNT: 0.1 10*9/L (ref 0.0–0.4)
EOSINOPHILS RELATIVE PERCENT: 3.1 %
HEMATOCRIT: 33.2 % — ABNORMAL LOW (ref 41.0–53.0)
HEMOGLOBIN: 10.9 g/dL — ABNORMAL LOW (ref 13.5–17.5)
LARGE UNSTAINED CELLS: 1 % (ref 0–4)
LYMPHOCYTES ABSOLUTE COUNT: 0.1 10*9/L — ABNORMAL LOW (ref 1.5–5.0)
LYMPHOCYTES RELATIVE PERCENT: 1.2 %
MEAN CORPUSCULAR HEMOGLOBIN CONC: 32.9 g/dL (ref 31.0–37.0)
MEAN CORPUSCULAR HEMOGLOBIN: 32.7 pg (ref 26.0–34.0)
MEAN CORPUSCULAR VOLUME: 99.4 fL (ref 80.0–100.0)
MEAN PLATELET VOLUME: 7.3 fL (ref 7.0–10.0)
MONOCYTES ABSOLUTE COUNT: 0.1 10*9/L — ABNORMAL LOW (ref 0.2–0.8)
NEUTROPHILS ABSOLUTE COUNT: 4 10*9/L (ref 2.0–7.5)
NEUTROPHILS RELATIVE PERCENT: 91.5 %
PLATELET COUNT: 299 10*9/L (ref 150–440)
RED BLOOD CELL COUNT: 3.34 10*12/L — ABNORMAL LOW (ref 4.50–5.90)
RED CELL DISTRIBUTION WIDTH: 18.3 % — ABNORMAL HIGH (ref 12.0–15.0)
WBC ADJUSTED: 4.4 10*9/L — ABNORMAL LOW (ref 4.5–11.0)

## 2019-06-27 LAB — MAGNESIUM: Magnesium:MCnc:Pt:Ser/Plas:Qn:: 1.4 — ABNORMAL LOW

## 2019-06-27 LAB — TACROLIMUS BLOOD: Lab: 8.3

## 2019-06-27 LAB — HEMOGLOBIN: Hemoglobin:MCnc:Pt:Bld:Qn:: 10.9 — ABNORMAL LOW

## 2019-06-27 LAB — CALCIUM: Calcium:MCnc:Pt:Ser/Plas:Qn:: 12.6

## 2019-06-28 LAB — CMV DNA, QUANTITATIVE, PCR: CMV VIRAL LD: NOT DETECTED

## 2019-06-28 LAB — CMV QUANT LOG10: Lab: 0

## 2019-07-01 ENCOUNTER — Ambulatory Visit: Admit: 2019-07-01 | Discharge: 2019-07-01 | Payer: MEDICARE

## 2019-07-01 ENCOUNTER — Ambulatory Visit
Admit: 2019-07-01 | Discharge: 2019-07-01 | Payer: MEDICARE | Attending: Infectious Disease | Primary: Infectious Disease

## 2019-07-01 DIAGNOSIS — D899 Disorder involving the immune mechanism, unspecified: Secondary | ICD-10-CM

## 2019-07-01 DIAGNOSIS — Z940 Kidney transplant status: Principal | ICD-10-CM

## 2019-07-01 DIAGNOSIS — N3000 Acute cystitis without hematuria: Principal | ICD-10-CM

## 2019-07-01 LAB — CBC W/ AUTO DIFF
BASOPHILS ABSOLUTE COUNT: 0 10*9/L (ref 0.0–0.1)
BASOPHILS RELATIVE PERCENT: 1.2 %
EOSINOPHILS ABSOLUTE COUNT: 0.1 10*9/L (ref 0.0–0.4)
EOSINOPHILS RELATIVE PERCENT: 3.4 %
HEMATOCRIT: 34.6 % — ABNORMAL LOW (ref 41.0–53.0)
HEMOGLOBIN: 10.9 g/dL — ABNORMAL LOW (ref 13.5–17.5)
LARGE UNSTAINED CELLS: 1 % (ref 0–4)
LYMPHOCYTES ABSOLUTE COUNT: 0.1 10*9/L — ABNORMAL LOW (ref 1.5–5.0)
LYMPHOCYTES RELATIVE PERCENT: 1.7 %
MEAN CORPUSCULAR HEMOGLOBIN CONC: 31.4 g/dL (ref 31.0–37.0)
MEAN CORPUSCULAR VOLUME: 96.8 fL (ref 80.0–100.0)
MEAN PLATELET VOLUME: 7.9 fL (ref 7.0–10.0)
MONOCYTES RELATIVE PERCENT: 4.6 %
NEUTROPHILS ABSOLUTE COUNT: 3.1 10*9/L (ref 2.0–7.5)
NEUTROPHILS RELATIVE PERCENT: 88.4 %
PLATELET COUNT: 305 10*9/L (ref 150–440)
RED BLOOD CELL COUNT: 3.57 10*12/L — ABNORMAL LOW (ref 4.50–5.90)
RED CELL DISTRIBUTION WIDTH: 18 % — ABNORMAL HIGH (ref 12.0–15.0)
WBC ADJUSTED: 3.5 10*9/L — ABNORMAL LOW (ref 4.5–11.0)

## 2019-07-01 LAB — RENAL FUNCTION PANEL
ALBUMIN: 4.2 g/dL (ref 3.5–5.0)
ANION GAP: 12 mmol/L (ref 7–15)
BLOOD UREA NITROGEN: 32 mg/dL — ABNORMAL HIGH (ref 7–21)
BUN / CREAT RATIO: 37
CALCIUM: 12 mg/dL — ABNORMAL HIGH (ref 8.5–10.2)
CO2: 24 mmol/L (ref 22.0–30.0)
CREATININE: 0.87 mg/dL (ref 0.70–1.30)
EGFR CKD-EPI AA MALE: >90 mL/min/{1.73_m2} (ref >=60)
EGFR CKD-EPI NON-AA MALE: >90 mL/min/{1.73_m2} (ref >=60)
GLUCOSE RANDOM: 116 mg/dL (ref 70–179)
PHOSPHORUS: 2.5 mg/dL — ABNORMAL LOW (ref 2.9–4.7)
POTASSIUM: 5.6 mmol/L — ABNORMAL HIGH (ref 3.5–5.0)
SODIUM: 136 mmol/L (ref 135–145)

## 2019-07-01 LAB — MAGNESIUM
MAGNESIUM: 1.3 mg/dL — ABNORMAL LOW (ref 1.6–2.2)
Magnesium:MCnc:Pt:Ser/Plas:Qn:: 1.3 — ABNORMAL LOW

## 2019-07-01 LAB — ALBUMIN: Albumin:MCnc:Pt:Ser/Plas:Qn:: 4.2

## 2019-07-01 LAB — TACROLIMUS BLOOD: Lab: 8.9

## 2019-07-01 LAB — BASOPHILS RELATIVE PERCENT: Lab: 1.2

## 2019-07-01 NOTE — Unmapped (Signed)
[facility] Follow-Up Note    Assessment/Plan:   Louis Dennis is a 58 y.o. 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  Pertinent Exposure History  Donor Infections: CONS in blood  ??  Born in [place]- Strongyloides Ab negative  Previous Sushi Consumption- no longer  ??  Pertinent Co-morbidities  HTN  Aortic Sclerosis  ??  Drug Intolerances  None  ??  Infection History  K. pneumo UTI post-transplant (R cipro, tmp/smx.)  ??  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 pneumo UTI (06/03/19) with cephalexin. His dysuria has resolved since then.  He has completed antibiotics and feels well. He had some pain over his staples, which is improving.  He   Denies fevers.  He is walking inside.  Denies SOB.   Denies diarrhea/n/v    medications  Current Outpatient Medications   Medication Sig Dispense Refill   ??? acetaminophen (TYLENOL) 500 MG tablet Take 1-2 tablets (500-1,000 mg total) by mouth every six (6) hours as needed for pain. 100 tablet 0   ??? allopurinol (ZYLOPRIM) 100 MG tablet Take 100 mg by mouth daily.     ??? aspirin (ECOTRIN) 81 MG tablet Take 1 tablet (81 mg total) by mouth daily. 30 tablet 11   ??? biotin 5 mg cap Take 1 capsule (5,000 mcg total) by mouth daily.  0   ??? cinacalcet (SENSIPAR) 60 MG tablet Take 3 tablets (180 mg total) by mouth daily. 90 tablet 11 ??? docusate sodium (COLACE) 100 MG capsule Take 1 capsule (100 mg total) by mouth two (2) times a day as needed for constipation. (Patient not taking: Reported on 06/03/2019) 60 capsule 0   ??? magnesium oxide-Mg AA chelate (MAGNESIUM, AMINO ACID CHELATE,) 133 mg Tab Take 3 tablets by mouth Two (2) times a day. 200 tablet 5   ??? metoprolol tartrate (LOPRESSOR) 25 MG tablet Take 1 tablet (25 mg total) by mouth Two (2) times a day. 60 tablet 11   ??? MYFORTIC 180 mg EC tablet Take 3 tablets (540 mg total) by mouth Two (2) times a day. 180 tablet 11   ??? omeprazole 20 mg tablet Take 1 tablet (20 mg total) by mouth daily as needed. 30 tablet 11   ??? sulfamethoxazole-trimethoprim (BACTRIM) 400-80 mg per tablet Take 1 tablet (80 mg of trimethoprim total) by mouth Every Monday, Wednesday, and Friday. 12 tablet 5   ??? tacrolimus (PROGRAF) 1 MG capsule Take 3 capsules (3 mg) by mouth in the morning and 2 capsules (2 mg) at night 150 capsule 11   ??? valGANciclovir (VALCYTE) 450 mg tablet Take 1 tablet (450 mg total) by mouth daily. 30 tablet 2     No current facility-administered medications for this visit.  Objective:  Physical Exam:  BP 125/70  - Pulse 66  - Temp 36.6 ??C (97.9 ??F) (Tympanic)  - Ht 162.6 cm (5' 4)  - Wt 61.5 kg (135 lb 8 oz)  - BMI 23.26 kg/m??   heent no thrush, no oral ulcers  Conjunctivae clear  Neck supple  Spine non-tender  No CVA tenderness  Chest with line removed from R chest with hematoma, but otherwise c/d/i  Lungs cta  Heart s1,s2, no murmurs  abd soft, nt/nd, wound with staples c/d/i  Ext no joint effusions  No edema  No rashes  Neuro: AOx3, no abnormal movements noted    LABS:  Lab Results   Component Value Date    WBC 3.5 (L) 07/01/2019    WBC 4.4 (L) 06/27/2019    WBC 5.6 06/24/2019    WBC 4.0 (L) 06/20/2019    WBC 4.1 (L) 06/17/2019    WBC 9.4 07/10/2013    WBC 11.1 (H) 12/14/2011     Lab Results   Component Value Date    Creatinine 0.97 06/27/2019    Creatinine 0.75 06/24/2019    Creatinine 0.78 06/20/2019    Creatinine 0.79 06/17/2019    Creatinine 0.85 06/14/2019    Creatinine 10.11 (H) 07/10/2013    Creatinine 10.82 (H) 12/14/2011     Lab Results   Component Value Date    Sed Rate 60 (H) 04/11/2016    CRP 23.9 (H) 04/11/2016       CMV 7/23 ND    Urine 06/03/19  Comprehensive 10,000 to 50,000 CFU/mL Klebsiella pneumoniaeAbnormal     Susceptibility Testing By Consultation Only   Processed at Physicians Request      Specimen Source: Clean Catch      Resulting Agency: [facility]   Susceptibility     Klebsiella pneumoniae     KIRBY BAUER     Ampicillin Resistant     Ampicillin + Sulbactam Susceptible     Cefazolin Susceptible     Cephalexin Susceptible1     Ciprofloxacin Resistant     Gentamicin Susceptible     Levofloxacin Intermediate     Nitrofurantoin Resistant     Tobramycin Susceptible     Trimethoprim + Sulfamethoxazole Resistant         Urine 06/17/19 NG

## 2019-07-02 ENCOUNTER — Institutional Professional Consult (permissible substitution): Admit: 2019-07-02 | Discharge: 2019-07-03 | Payer: MEDICARE

## 2019-07-02 NOTE — Unmapped (Signed)
[facility] HOSPITALS TRANSPLANT TELEPHONE PHARMACY NOTE  07/02/2019   Louis Dennis  [id number]    Considerations for 7/30 visit  1. If needs parathyroidectomy workup  2. G6PD WNL so could transition from Bactrim to dapson due to K+  3. Evaluate testicular pain    Medication changes  1. Change omeprazole to PRN    Education/Adherence tools provided today:  1.  provided additional education on immunosuppression and transplant related medications including reviewing indications of medications, dosing and side effects    Follow up items:  1. goal of understanding indications and dosing of immunosuppression medications  2. Consider starting statin at a later date  3. Monitor FBG    Next visit with pharmacy in 1 month  ____________________________________________________________________    Louis Dennis is a 58 y.o. 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     Other PMH significant for hypertension, gout    Called by pharmacy today for: medication management and pill box fill and adherence education; last seen by pharmacy 2 weeks ago. Today's visit was conducted via telephone due to the COVID-19 pandemic    CC:  Patient complains of tailbone pain that has been ongoing since transplant as well as some L testicular pain    There were no vitals filed for this visit.    No Known Allergies    All medications reviewed and updated.     Medication list includes revisions made during today???s encounter    Outpatient Encounter Medications as of 07/02/2019   Medication Sig Dispense Refill   ??? acetaminophen (TYLENOL) 500 MG tablet Take 1-2 tablets (500-1,000 mg total) by mouth every six (6) hours as needed for pain. 100 tablet 0   ??? allopurinol (ZYLOPRIM) 100 MG tablet Take 100 mg by mouth daily.     ??? aspirin (ECOTRIN) 81 MG tablet Take 1 tablet (81 mg total) by mouth daily. 30 tablet 11   ??? biotin 5 mg cap Take 1 capsule (5,000 mcg total) by mouth daily.  0   ??? cinacalcet (SENSIPAR) 60 MG tablet Take 3 tablets (180 mg total) by mouth daily. 90 tablet 11   ??? docusate sodium (COLACE) 100 MG capsule Take 1 capsule (100 mg total) by mouth two (2) times a day as needed for constipation. (Patient not taking: Reported on 06/03/2019) 60 capsule 0   ??? magnesium oxide-Mg AA chelate (MAGNESIUM, AMINO ACID CHELATE,) 133 mg Tab Take 3 tablets by mouth Two (2) times a day. 200 tablet 5   ??? metoprolol tartrate (LOPRESSOR) 25 MG tablet Take 1 tablet (25 mg total) by mouth Two (2) times a day. 60 tablet 11   ??? MYFORTIC 180 mg EC tablet Take 3 tablets (540 mg total) by mouth Two (2) times a day. 180 tablet 11   ??? omeprazole 20 mg tablet Take 1 tablet (20 mg total) by mouth daily as needed. 30 tablet 11   ??? sulfamethoxazole-trimethoprim (BACTRIM) 400-80 mg per tablet Take 1 tablet (80 mg of trimethoprim total) by mouth Every Monday, Wednesday, and Friday. 12 tablet 5   ??? tacrolimus (PROGRAF) 1 MG capsule Take 3 capsules (3 mg) by mouth in the morning and 2 capsules (2 mg) at night 150 capsule 11   ??? valGANciclovir (VALCYTE) 450 mg tablet Take 1 tablet (450 mg total) by mouth daily. 30 tablet 2     No facility-administered encounter medications on file as of 07/02/2019.      Induction agent : alemtuzumab    CURRENT  IMMUNOSUPPRESSION: tacrolimus 3 mg qAM and 2 mg qPM  prograf/Envarsus/cyclosporine goal: 8-10   myfortic540  mg PO bid    steroid free     Patient is tolerating immunosuppression well other than some hair thinning    IMMUNOSUPPRESSION DRUG LEVELS:  Lab Results   Component Value Date    Tacrolimus, Trough 8.7 06/17/2019    Tacrolimus, Trough 5.3 05/27/2019    Tacrolimus, Trough 4.3 (L) 05/26/2019    Tacrolimus, Timed 8.9 07/01/2019    Tacrolimus, Timed 8.3 06/27/2019    Tacrolimus, Timed 9.3 06/24/2019     No results found for: CYCLO  No results found for: EVEROLIMUS  No results found for: SIROLIMUS    Did not have Prograf level drawn today    Graft function: stable   DSA: ntd  Biopsies to date: ntd  WBC/ANC:  wnl    Plan: Will maintain current immunosuppression. Continue to monitor.    OI Prophylaxis:   CMV Status: D+/ R+, moderate risk . CMV prophylaxis: valganciclovir 450 mg daily x 3 months per protocol.  Lab Results   Component Value Date    CMV Quant <50 (H) 06/24/2019    CMV Quant <50 (H) 06/17/2019     PCP Prophylaxis: bactrim SS 1 tab MWF x 6 months.  K pneumo UTI 6/29: completed 7d of cephalexin  Thrush: completed in hospital   Donor positive BCx s. [place]: dapto inpatient - linezolid 600 mg BID outpt thru 7/3 complete  Patient is  tolerating infectious prophylaxis well    Plan: Continue per protocol. Continue to monitor.    CV Prophylaxis: asa 81 mg   The 10-year ASCVD risk score Denman George DC Jr., et al., 2013) is: 8.7%  Statin therapy: Indicated; currently on no statin  Plan: consider starting statin at a later date. Continue to monitor     BP/history of paroxysmal AF: Goal < 140/90. Clinic vitals reported above  Home BP ranges:110s-125/65-70  Current meds include: metoprolol 25 mg BID  Plan: within goal. Continue to monitor    Anemia of CKD:  H/H:   Lab Results   Component Value Date    HGB 10.9 (L) 07/01/2019     Lab Results   Component Value Date    HCT 34.6 (L) 07/01/2019     Iron panel:  No results found for: IRON, TIBC, FERRITIN  No results found for: LABIRON    Prior ESA use: none post transplant    Plan: within goal. Continue to monitor.     DM:   Lab Results   Component Value Date    A1C 5.0 04/02/2019   . Goal A1c < 7  History of Dm? No  Established with endocrinologist/PCP for BG managment? No  Currently on: no medications  Home BS log:not checking but FBG on labs have been in 110s  Diet: did not address specifics but patient reports to eating well  Exercise:not yet  Fluid intake: ~2.5L daily  Plan:  Monitor FBG on labs. Continue to monitor    Electrolytes: K 5.6, calcium 12.0  Meds currently on: Sensipar 180 mg daily, mg plus protein 266 mg BID   Plan:  Encouraged low K+ containing diet.  Continue to monitor     GI/BM: pt reports no diarrhea or constipation, no heartburn with PPI  Meds currently on: docusate and Miralax PRN (not using), omeprazole 20 mg daily  Plan: Change PPI to PRN. Continue to monitor    Pain: pt reports moderate low back pain near tailbone  Meds currently on: APAP PRN, Bengay to back  Plan: Continue to monitor    Bone health:   Vitamin D Level: none available. Goal > 30.   Last DEXA results:  none available  Current meds include: Sensipar 120 mg daily  Plan: Vitamin D level  needs to be drawn with next lab schedule. Continue to monitor.     Women's/Men's Health:  Louis Dennis is a 58 y.o. male. Patient reports no men's/women's health issues  Plan: Continue to monitor    Gout - no recent flares  Medications currently on: allopurinol 100 mg daily  Plan: continue to monitor    Adherence: Patient has average understanding of medications; was able to independently identify names/doses of immunosuppressants and OI meds.  Patient  does not fill their own pill box on a regular basis at home.  His wife manages his medications and was not present at today's visit. His son, Louis Dennis, accompanied him.   Patient brought medication card:yes  Pill box:N/A phone visit   Plan: Encouraged patient to learn more about medications; provided extensive adherence counseling/intervention    Spent approximately 30 minutes on educating this patient and greater than 50% was spent in direct face to face counseling regarding post transplant medication education. Questions and concerns were address to patient's satisfaction.    Patient was reviewed with Dr. Gwynneth Munson who was agreement with the stated plan:     During this visit, the following was completed:   BG log data assessment  BP log data assessment  Labs ordered and evaluated  complex treatment plan >1 DS   Patient education was completed for 11-24 minutes     All questions/concerns were addressed to the patient's satisfaction.  __________________________________________  Cleone Slim, PHARMD, CPP  [facility] CLINICAL PHARMACIST PRACTITIONER  PAGER [phone]

## 2019-07-03 LAB — CMV DNA, QUANTITATIVE, PCR: CMV VIRAL LD: DETECTED — AB

## 2019-07-03 LAB — CMV COMMENT: Lab: 0

## 2019-07-04 ENCOUNTER — Ambulatory Visit: Admit: 2019-07-04 | Discharge: 2019-07-04 | Payer: MEDICARE | Attending: Nephrology | Primary: Nephrology

## 2019-07-04 ENCOUNTER — Ambulatory Visit: Admit: 2019-07-04 | Discharge: 2019-07-04 | Payer: MEDICARE

## 2019-07-04 DIAGNOSIS — Z940 Kidney transplant status: Secondary | ICD-10-CM

## 2019-07-04 DIAGNOSIS — M5440 Lumbago with sciatica, unspecified side: Secondary | ICD-10-CM

## 2019-07-04 DIAGNOSIS — Z79899 Other long term (current) drug therapy: Secondary | ICD-10-CM

## 2019-07-04 LAB — COMPREHENSIVE METABOLIC PANEL
ALBUMIN: 4.4 g/dL (ref 3.5–5.0)
ALKALINE PHOSPHATASE: 221 U/L — ABNORMAL HIGH (ref 38–126)
ALT (SGPT): 26 U/L (ref <50)
ANION GAP: 16 mmol/L — ABNORMAL HIGH (ref 7–15)
AST (SGOT): 22 U/L (ref 19–55)
BILIRUBIN TOTAL: 0.3 mg/dL (ref 0.0–1.2)
BLOOD UREA NITROGEN: 31 mg/dL — ABNORMAL HIGH (ref 7–21)
BUN / CREAT RATIO: 36
CALCIUM: 12.1 mg/dL (ref 8.5–10.2)
CHLORIDE: 99 mmol/L (ref 98–107)
CO2: 22 mmol/L (ref 22.0–30.0)
CREATININE: 0.87 mg/dL (ref 0.70–1.30)
EGFR CKD-EPI AA MALE: >90 mL/min/{1.73_m2} (ref >=60)
EGFR CKD-EPI NON-AA MALE: >90 mL/min/{1.73_m2} (ref >=60)
GLUCOSE RANDOM: 115 mg/dL (ref 70–179)
POTASSIUM: 5.3 mmol/L — ABNORMAL HIGH (ref 3.5–5.0)
PROTEIN TOTAL: 7.1 g/dL (ref 6.5–8.3)

## 2019-07-04 LAB — MAGNESIUM: Magnesium:MCnc:Pt:Ser/Plas:Qn:: 1.3 — ABNORMAL LOW

## 2019-07-04 LAB — URINALYSIS
BACTERIA: NONE SEEN /HPF
BILIRUBIN UA: NEGATIVE
GLUCOSE UA: NEGATIVE
KETONES UA: NEGATIVE
LEUKOCYTE ESTERASE UA: NEGATIVE
NITRITE UA: NEGATIVE
PH UA: 6 (ref 5.0–9.0)
PROTEIN UA: NEGATIVE
RBC UA: 1 /HPF (ref <=3)
SPECIFIC GRAVITY UA: 1.013 (ref 1.003–1.030)
SQUAMOUS EPITHELIAL: <1 /HPF (ref 0–5)
UROBILINOGEN UA: 0.2
WBC UA: 2 /HPF (ref <=2)

## 2019-07-04 LAB — CBC W/ AUTO DIFF
BASOPHILS ABSOLUTE COUNT: 0 10*9/L (ref 0.0–0.1)
BASOPHILS RELATIVE PERCENT: 1 %
EOSINOPHILS RELATIVE PERCENT: 1.8 %
HEMATOCRIT: 37.1 % — ABNORMAL LOW (ref 41.0–53.0)
HEMOGLOBIN: 12 g/dL — ABNORMAL LOW (ref 13.5–17.5)
LARGE UNSTAINED CELLS: 1 % (ref 0–4)
LYMPHOCYTES ABSOLUTE COUNT: 0.1 10*9/L — ABNORMAL LOW (ref 1.5–5.0)
LYMPHOCYTES RELATIVE PERCENT: 1 %
MEAN CORPUSCULAR HEMOGLOBIN CONC: 32.4 g/dL (ref 31.0–37.0)
MEAN CORPUSCULAR VOLUME: 99.2 fL (ref 80.0–100.0)
MEAN PLATELET VOLUME: 7.4 fL (ref 7.0–10.0)
MONOCYTES ABSOLUTE COUNT: 0.2 10*9/L (ref 0.2–0.8)
MONOCYTES RELATIVE PERCENT: 5.3 %
NEUTROPHILS ABSOLUTE COUNT: 4.1 10*9/L (ref 2.0–7.5)
NEUTROPHILS RELATIVE PERCENT: 90.3 %
PLATELET COUNT: 316 10*9/L (ref 150–440)
RED BLOOD CELL COUNT: 3.74 10*12/L — ABNORMAL LOW (ref 4.50–5.90)
RED CELL DISTRIBUTION WIDTH: 17.8 % — ABNORMAL HIGH (ref 12.0–15.0)
WBC ADJUSTED: 4.5 10*9/L (ref 4.5–11.0)

## 2019-07-04 LAB — PROTEIN/CREAT RATIO, URINE: Protein/Creatinine:MRto:Pt:Urine:Qn:: 0

## 2019-07-04 LAB — HYPOCHROMIA

## 2019-07-04 LAB — PHOSPHORUS: Phosphate:MCnc:Pt:Ser/Plas:Qn:: 2.1 — ABNORMAL LOW

## 2019-07-04 LAB — EGFR CKD-EPI AA MALE: Lab: >90

## 2019-07-04 LAB — TACROLIMUS, TROUGH: Lab: 8.6

## 2019-07-04 LAB — VITAMIN D, TOTAL (25OH): Lab: 37.7

## 2019-07-04 LAB — NITRITE UA: Lab: NEGATIVE

## 2019-07-04 MED ORDER — DAPSONE 100 MG TABLET
ORAL_TABLET | Freq: Every day | ORAL | 4 refills | 30 days | Status: CP
Start: 2019-07-04 — End: 2019-10-02
  Filled 2019-07-04: qty 30, 30d supply, fill #0

## 2019-07-04 MED FILL — DAPSONE 100 MG TABLET: 30 days supply | Qty: 30 | Fill #0 | Status: AC

## 2019-07-04 NOTE — Unmapped (Signed)
[id number] scope was used for procedure.

## 2019-07-04 NOTE — Unmapped (Signed)
[facility]:  Taking Care of Yourself After Cystoscopy Procedures    *Drink plenty of water for a day or two following your procedure.  Try to have about 8 ounces (one cup) at a time, and do this 6 times or more per day.  (If you have fluid restrictions, please ask the nurse or doctor for advice).    *AVOID alcoholic, carbonated and caffeinated drinks for a day or two, as they may cause uncomfortable symptoms.    *For the first 8 hours after the procedure, your urine may be pink or red in color.  Small clots or a few drops of blood can be a normal side effect of the instruments.  Large amounts of bleeding or difficulty urinating are not normal.  Call your doctor if this happens.    *You may experience some mild discomfort of a burning sensation with urination after having this procedure.  If it does not improve, or if other symptoms appear (fever, chills, or difficulty emptying), call your doctor.    *You may return to normal daily activities such as work, school, driving, exercising and housework.    *If your doctor gave you a prescription, take it as ordered.    *If you need a return appointment, the secretary will make it for you when you check out. To contact the Urology Clinic during business hours, call [phone].    *[facility] Operator can be reached at [phone] if you need to get in contact with your doctor.  After the hours, the operator can page the doctor on call for urgent concerns:    Urology Patients should ask for the Urology resident “on call”.    You can get more immediate assistance at the Emergency Room or Urgent Care if necessary.

## 2019-07-04 NOTE — Unmapped (Signed)
Procedures  58yM s/p renal transplant    Cystoscopy, ureteral stent removal  Timeout was performed immediately prior to the procedure.    The patient was prepped and draped in the usual sterile fashion.  Flexible cystoscopy was performed.  The indwelling transplant ureteral stent was visualized, grasped, and removed intact.  The patient tolerated the procedure well and was not given one dose of antibiotic prophylaxis afterwards.    Plan: return prn

## 2019-07-04 NOTE — Unmapped (Addendum)
We are going to send more blood tests to look into other possible reasons for you high calcium level. We may need to ask you to see an endocrine doctor.    Please keep an eye on your lower back pain. It is worsens or persistent, let us know and you may need to see a spine doctor. In the mean time, work on using lumbar support and follow the given back exercises.     Because you potassium level remains slightly high so we are going to change the Bactrim medicine to DAPSONE which does the same thing but does not raise your potassium.    Patient Education        ??: ??  Low Back Pain: Exercises  ?? ??  ??? ?? ?? ??? ?? ???? ??? ? ?? ????. ? ??? ??? ?????. ??? ???? ???? ??? ??? ??????.  ?? ??? ?????? ? ??? ???? ??? ?? ? ?? ??? ?? ?? ?? ????.  ?? ?? ??  ????   1. ?? ?? ???? ????? ?? ?????.  2. ????? ??? ?? ? ??? ??????. ??? ??? ? ??? ?????, ? ??? ???? ???? ?? ????? ?????? ???. ?? ?????? ??? ???? ???? ??? ???.  3. ??? 15-30?? ??? ? ?? ???.  4. 2-4? ?????.    ? & ?? ??(? ?) ??   1. ??: ? ??? ??? ????. ?? ?? ?? ????, ?? ??? ??? ?? ??? ???? ??? ????.  2. ??? ???? ??? ?? ?????.  3. ? ??? ?? ???.  4. ?? ??? ???? ???? ? ?? ?? ? ?? ?????. ??? ??? ?? ?? ??? ???? ? ????. ??? ??? ??? ?????.  5. ? 6?? ??? ????? ??? ??? ?? ? ??? ????.  6. ?? ?? ? 8-12? ?????.  7. ??? ??? ?? ?? ?? ??? 10-30?? ??? ? ????.  8. ??? ? ???? ???? ???? ??, ??? ??? ??? ??? ?? ??? ?? ?? ?? ??? ? ????.    ??-?? ??   1. ?? ?? ??? ?? ??? ??? ?? ???? ??? ???? ???.  2. ?? ??? ???? ?????, ?? ? ?? ?? ???? ??? ?? ??? ?????(?? ?? ? ??? ?? ? ??? ???? ???. ? ?? ? ??? ??? ???? ??? ?????).  3. ??? ?? ??? ?? ??? ?????. ?? 15-30?? ?????.  4. ?? ?? ??? ??? ??? ? ??? ?????.  5. ?? ? ??? ?? ???? ?????. ?? ?? ? 2-4? ?????.  6. ???? ??? ????, ?? ? ??? ??? ?? ??? ??? ???? ??????.    ??   1. ??? ?? ?? ??? ? ??? 90? ??? ?????. ????? ??? ???? ??? ????? ? 12??(30cm) ?? ??? ???.  2. ??? ?? ?? ??????. ? ??? ?? ? ?? ??? ??, ?? ???? ?? ??? ? ??? (??? ???) ? ?? ????.  3. ??? ? ??? ?? ?? ???? ???? ??????.  4. ??? ?? ????? ????, ??? ??? ??? ??? ???.  5. ? ??? 1-2?? ??? ?? ?? ??? ?? ???? ????.  6. 8-12? ?????.    ?? ???? ??   1. ?? ?? ?? ???? ?????.  2. ?? ????(brace)??? ??????? ?? ??? ?? ??? ???? ????? ? ??? ?? ?? ??? ???? ?? ?????. ?? ??? ???, ???? ??? ?? ???? ? ?? ???? ???.  3. ???? ????? ? 6?? ??? ?????.  4. 8-12? ?????.    ???? ?? ??? ???   1. ?? ?? ?? ??? ?? ??? ??? ???? ????? ??? ???? ???. ???? ?  90? ??? ???? ??? ???.  2. ?? ?? ????? ??? ??? ??? ???? ?? ??? ???? ???? ???, ??, ???? ?? ???? ??? ???.  3. ??? ????? ??? ? 6?? ??? ??? ?, ??? ?? ??? ???? ?? ?? ?? 10?? ??? ????.  4. 8-12? ?????.    ? ????? ?? ??? ????   1. ? ??? ??? ?? ?? ?? ?? ??? ?? ? ??? ???? ???.  2. ?? ? ??? ?(???)? ?? ???? ??? ?? ???? ???. ?? ??? ??? ???? ?? ???? ???.  3. ???? ??? ?? 15-30?? ?????. ?? ?????, ??? ???? ??? ??? ???? ????. ?? ????? ??? ??, ?? ? ????? ?? ? ??? ?????.  4. ?? ? ??? ?? ???? ?????.  5. ?? ?? ? 2-4? ?????.    ??? ?? ????   1. ?? ??? ???? ?? ? ??? ?? ?? ? ??? ??? ??? ????. ?? ?? ??? ? ?? ????. ?? ? ??? ??? ?? ??? ???.  2. ?? ??? ??? ??? ???? ?? ??? ??? ???? ??? ??? ???.  3. ???? ??? ?? 15-30?? ?????. ?? ? ??? ?? ???? ?????.  4. ?? ? 2-4? ?????.    ?? ?? ??   1. ?? ??? ??? ??? 10-12??(25-30cm) ?? ??? ???.  2. ?? ?? ?? ?? ??? ?? ????.  3. ??? ?? ?? ???? ???? ?? ???? ??? ??? ??? ?????.  4. ? 6?? ??? ??? ? ?? ?? ?? ?? ?????.  5. 8-12? ?????.    ??? ???? ?? ??? ??? ??? ???? ???. ?? ?? ??? ??? ???? ??, ??? ??? ?? ???? ??????. ?? ??? ?? ?? ?? ?? ?? ??? ??? ???? ?? ?? ?????.  ??? ??? ??? ??? ? ????  ??   [email or web address]  ?? [id number] ?? ??? ??? ??? ?? ?? ??: ??.  ?? ??: 2020? 3? 2? ????????????? ??: 12.5  ?? 2006-2020 [employer].   ?? ??? ???? ?? ?? ??? ????. ???: ??? ?? ???. ?? ???? ? ?? ??? ?? ??? ??? ???? ?? ?? ??? ????? ??????. [employer]? ??? ? ??? ???? ? ?? ?? ????? ???? ????.

## 2019-07-04 NOTE — Unmapped (Signed)
Transplant Nephrology Clinic Visit    History of Present Illness  [name] is a 58 y.o. 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    Transplant History:  - Received DBD,deceased donor KT on 29-May-2019, KDPI 30%  - Serologies: CMV D+/R+, EBV D-/R-, Toxo D-/R-  - Induction: alumtuzumab  - 06/03/19: Klebsiella pneumoniae UTI, completed course of cephalexin  - Post-transplant hypercalcemia not controlled with sensipar    Current Immunosuppression Regimen:   - Myfortic 540mg  BID  - Tacrolimus 3mg  qAM and 2mg  qPM      Subjective/Interval: Since his discharge, he has overall done well. He had lost some weight around the time of transplantation and is working on eating well and gaining that back some weight. His incision is healing well. He has even starting to work a little but only 1 hour a day.    His primary issue is lower back pain that radiates down to his tail bone. He describes the radiation as pins and needles sensation. He son confirms that when he is sitting, he is spending a lot of time on the couch. He denies any laterality or other red flag symptoms. He has been taking tylenol as needed which sufficiently controls the pain. On pre-transplant imaging, he has known multilevel degenerative changes.    Medication list reviews and he reports taking his sensipar correctly with meals. All his previous dialysis related medications (I.e. Tums and calcitriol) have been discontinued.     Review of Systems  Otherwise as per HPI, all other systems reviewed and are negative.    Medications  Current Outpatient Medications   Medication Sig Dispense Refill   ??? acetaminophen (TYLENOL) 500 MG tablet Take 1-2 tablets (500-1,000 mg total) by mouth every six (6) hours as needed for pain. 100 tablet 0   ??? allopurinol (ZYLOPRIM) 100 MG tablet Take 100 mg by mouth daily.     ??? aspirin (ECOTRIN) 81 MG tablet Take 1 tablet (81 mg total) by mouth daily. 30 tablet 11   ??? biotin 5 mg cap Take 1 capsule (5,000 mcg total) by mouth daily.  0   ??? cinacalcet (SENSIPAR) 60 MG tablet Take 3 tablets (180 mg total) by mouth daily. 90 tablet 11   ??? dapsone 100 MG tablet Take 1 tablet (100 mg total) by mouth daily. 30 tablet 4   ??? magnesium oxide-Mg AA chelate (MAGNESIUM, AMINO ACID CHELATE,) 133 mg Tab Take 3 tablets by mouth Two (2) times a day. 200 tablet 5   ??? metoprolol tartrate (LOPRESSOR) 25 MG tablet Take 1 tablet (25 mg total) by mouth Two (2) times a day. 60 tablet 11   ??? MYFORTIC 180 mg EC tablet Take 3 tablets (540 mg total) by mouth Two (2) times a day. 180 tablet 11   ??? omeprazole 20 mg tablet Take 1 tablet (20 mg total) by mouth daily as needed. 30 tablet 11   ??? tacrolimus (PROGRAF) 1 MG capsule Take 3 capsules (3 mg) by mouth in the morning and 2 capsules (2 mg) at night 150 capsule 11   ??? valGANciclovir (VALCYTE) 450 mg tablet Take 1 tablet (450 mg total) by mouth daily. 30 tablet 2     No current facility-administered medications for this visit.            Physical Exam  BP 122/83 (BP Site: L Arm, BP Position: Sitting,  BP Cuff Size: Medium)  - Pulse 72  - Temp 36.3 ??C (Temporal)  - Wt 61.7 kg (136 lb)  - BMI 23.34 kg/m??   General: Well appearing male, no acute distress  HEENT: mucous membranes moist, EOMI, sclera anicteric  Neck: Neck supple, no cervical lymphadenopathy appreciated. No overt palpable thyroid nodules. No tenderness with palaption.  CV: normal rate, normal rhythm, no murmur, no gallops, no rubs appreciated  Lungs: clear to auscultation bilaterally, normal work of breathing.  Abdomen: soft, non tender, RLQ surgical site c/d/i with surgical staples in place.  Extremities: no edema  Musculoskeletal: no visible deformity, normal range of motion.  Pulses: intact distally throughout  Neurologic: awake, alert, and oriented x3    Laboratory Data and Imaging reviewed in EPIC    Assessment: [name] is a 58 y.o. 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     Recommendations/Plan:   Allograft Function: DBD, deceased donor transplant (26-May-2019) with good graft function, baseline creatinine 0.9, UPC (06/17/19) 0.09. No DSA.   - Planning for ureter stent removal today  - Staples removed today in clinic  - Recheck UPC    Immunosuppression Management [High Risk Medical Decision Making For Drug Therapy Requiring Intensive Monitoring For Toxicity]:   - Tacrolimus trough goal 8 - 10, recently 8.9. Continue current dose 3mg  qAM and 2mg  qPM  - Continue Myfortic 540mg  BID    Blood Pressure Management: BP controlled.  - Continue metoprolol tartrate 25mg  BID    Infectious Prophylaxis and Monitoring:   - Given stable mild hyperkalemia, will transition from Bactrim to dapsone 100mg  PO daily for PCPppx. G6PD  (06/17/19) 9.6.  - Continue Valcyte 450mg  daily. CMV detectable but quant not measurable.    Hypercalcemia: Persistent elevated 12 - 12.5 since transplantation despite progressive up-titration of Sensipar. Reports compliance and no offending medications identified. PTH (06/24/19) 242.6.   - Continue Sensipar 180mg  daily  - Will being additional work up including SPEP/IFE and serum free light chains. Also consider evaluation for lymphoproliferative disorder if remains elevated.  - L Thyroid nodule FNA (05/13/19) with benign thyroid epithelium and colloid  - Routine cancer screening up to date prior to transplant    Lower Back Pain with midline radiculopathy: Developed over past few weeks since discharge. Activity level gradually improving but overall remains sedentary. Known underlying degenerative changes.  - Will continue to monitor, discussed red flag return precautions  - Referral for Spine Center evaluation  - Can continue Tylenol as needed for pain control  - Provided lower back exercises in [place]    Hypomagnesemia: Stable around 1.4, likely due to tarolimus.  - Continue maintenance oral supplementation    Lipid Management: LDL (03/01/19) 80, HDL 36, TChol 151  - Will continue to monitor     Health Maintenance:   - Colonoscopy due 2029  - Vaccination: PCV-23 completed 07/06/2015. Due for PCV-13 after month 12. Will need seasonal flu vaccination this fall.     Counseling:  I counseled the patient on the need to avoid sun exposure and the use of sunblock while outdoors given the relatively higher risk of skin malignancy in an immunosuppressed state and the need for adherence to immunosuppression medication.  Patient verbalized understanding.     Follow-Up:  Return to clinic in 4 weeks  Patient will continue to follow-up with his primary care provider for non-transplant related issues and medication refills. We have  ordered transplant specific labs per the center's guidelines to monitor and assess for toxicities from immunosuppressant drug therapy    Patient seen and evaluated with Dr. Gwynneth Munson who is in agreement with the plan.  07/04/2019  10:27 AM

## 2019-07-04 NOTE — Unmapped (Signed)
Transplant Coordinator, Clinic Visit   Pt overall feeling well, has increased his activity, walking more outside. Pt previously having pain in sacrum, using bengay and heating pad with positive effect. Pt states when he has pain in his sacrum, he noticed pins/needle pain in scrotum, no pain in scrotum currently. L testicle higher than R testicle. Pt has appt today to have stent removed at main hospital.   Assessment  BP: 122/83, at home 110-120/63-77, HR 73-75  Headache: after taking meds in morning, lasts for 20 min  Hand tremors: no  Numbness/tingling: no  Fevers: no  Chills/sweats: no  Shortness of breath: no  Chest pain or pressure: no  Palpitations: no  Nausea/vomiting: no  Diarrhea/constipation: no  UTI symptoms: no  Swelling: no  Intake/Output: in and out     Immunosuppressant last taken: 9pm last night    PER DR. KLEMAN: SWITCHING BACTRIM TO DAPSONE 100MG  DAILY, PRESCRIPTION SENT TO  OUT PT PHARMACY, PT WILL PICK UP AFTER STENT REMOVAL, STAPLES REMOVED, PT REFERRED TO [facility] FOR SACRAL PAIN    I spent a total of 10 minutes with [name] reviewing medications and symptoms.

## 2019-07-05 LAB — CMV QUANT LOG10: Lab: 0

## 2019-07-05 LAB — LAMBDA FREE, SER: Lab: 0.86

## 2019-07-05 LAB — CMV DNA, QUANTITATIVE, PCR

## 2019-07-06 LAB — SERUM PROTEIN ELECTROPHORESIS AND IMMUNOFIXATION
ALBUMIN (SPE): 4.7 g/dL (ref 3.5–5.0)
ALPHA-2 GLOBULIN: 0.8 g/dL (ref 0.5–1.1)
BETA-1 GLOBULIN: 0.4 g/dL (ref 0.3–0.6)
BETA-2 GLOBULIN: 0.3 g/dL (ref 0.2–0.6)
PROTEIN TOTAL: 7 g/dL (ref 6.5–8.3)

## 2019-07-06 LAB — BETA-2 GLOBULIN: Lab: 0.3

## 2019-07-07 LAB — EBV VIRAL LOAD RESULT: Lab: NOT DETECTED

## 2019-07-08 ENCOUNTER — Ambulatory Visit: Admit: 2019-07-08 | Discharge: 2019-07-09 | Payer: MEDICARE

## 2019-07-08 DIAGNOSIS — Z940 Kidney transplant status: Principal | ICD-10-CM

## 2019-07-08 DIAGNOSIS — D899 Disorder involving the immune mechanism, unspecified: Secondary | ICD-10-CM

## 2019-07-08 LAB — CBC W/ AUTO DIFF
BASOPHILS ABSOLUTE COUNT: 0.1 10*9/L (ref 0.0–0.1)
BASOPHILS RELATIVE PERCENT: 1.2 %
EOSINOPHILS RELATIVE PERCENT: 2.8 %
HEMOGLOBIN: 12 g/dL — ABNORMAL LOW (ref 13.5–17.5)
LARGE UNSTAINED CELLS: 1 % (ref 0–4)
LYMPHOCYTES ABSOLUTE COUNT: 0 10*9/L — ABNORMAL LOW (ref 1.5–5.0)
LYMPHOCYTES RELATIVE PERCENT: 0.8 %
MEAN CORPUSCULAR HEMOGLOBIN CONC: 33.1 g/dL (ref 31.0–37.0)
MEAN CORPUSCULAR HEMOGLOBIN: 32.9 pg (ref 26.0–34.0)
MEAN PLATELET VOLUME: 7.2 fL (ref 7.0–10.0)
MONOCYTES ABSOLUTE COUNT: 0.2 10*9/L (ref 0.2–0.8)
MONOCYTES RELATIVE PERCENT: 5.2 %
NEUTROPHILS ABSOLUTE COUNT: 4 10*9/L (ref 2.0–7.5)
NEUTROPHILS RELATIVE PERCENT: 89 %
PLATELET COUNT: 250 10*9/L (ref 150–440)
RED BLOOD CELL COUNT: 3.65 10*12/L — ABNORMAL LOW (ref 4.50–5.90)
RED CELL DISTRIBUTION WIDTH: 17.4 % — ABNORMAL HIGH (ref 12.0–15.0)
WBC ADJUSTED: 4.4 10*9/L — ABNORMAL LOW (ref 4.5–11.0)

## 2019-07-08 LAB — RENAL FUNCTION PANEL
ALBUMIN: 4.6 g/dL (ref 3.5–5.0)
ANION GAP: 13 mmol/L (ref 7–15)
BLOOD UREA NITROGEN: 30 mg/dL — ABNORMAL HIGH (ref 7–21)
BUN / CREAT RATIO: 32
CALCIUM: 12.1 mg/dL (ref 8.5–10.2)
CHLORIDE: 104 mmol/L (ref 98–107)
CO2: 21 mmol/L — ABNORMAL LOW (ref 22.0–30.0)
EGFR CKD-EPI AA MALE: >90 mL/min/{1.73_m2} (ref >=60)
EGFR CKD-EPI NON-AA MALE: 87 mL/min/{1.73_m2} (ref >=60)
GLUCOSE RANDOM: 141 mg/dL (ref 70–179)
PHOSPHORUS: 2.1 mg/dL — ABNORMAL LOW (ref 2.9–4.7)
POTASSIUM: 5.1 mmol/L — ABNORMAL HIGH (ref 3.5–5.0)
SODIUM: 138 mmol/L (ref 135–145)

## 2019-07-08 LAB — EOSINOPHILS ABSOLUTE COUNT: Lab: 0.1

## 2019-07-08 LAB — MAGNESIUM: Magnesium:MCnc:Pt:Ser/Plas:Qn:: 1.4 — ABNORMAL LOW

## 2019-07-08 LAB — TACROLIMUS BLOOD: Lab: 9.4

## 2019-07-08 LAB — CALCIUM: Calcium:MCnc:Pt:Ser/Plas:Qn:: 12.1

## 2019-07-08 MED FILL — ASPIRIN 81 MG TABLET,DELAYED RELEASE: ORAL | 30 days supply | Qty: 30 | Fill #1

## 2019-07-08 MED FILL — ASPIRIN 81 MG TABLET,DELAYED RELEASE: 30 days supply | Qty: 30 | Fill #1 | Status: AC

## 2019-07-08 MED FILL — SENSIPAR 60 MG TABLET: 30 days supply | Qty: 90 | Fill #0 | Status: AC

## 2019-07-08 NOTE — Unmapped (Signed)
[facility] Clinical Assessment & Refill Coordination Note    Louis Dennis, Louis Dennis: Sep 27, 1961  Phone: [phone] (home)     All above HIPAA information was verified with patient.     Specialty Medication(s):   Transplant: Cinacalcet 60mg      Current Outpatient Medications   Medication Sig Dispense Refill   ??? acetaminophen (TYLENOL) 500 MG tablet Take 1-2 tablets (500-1,000 mg total) by mouth every six (6) hours as needed for pain. 100 tablet 0   ??? allopurinol (ZYLOPRIM) 100 MG tablet Take 100 mg by mouth daily.     ??? aspirin (ECOTRIN) 81 MG tablet Take 1 tablet (81 mg total) by mouth daily. 30 tablet 11   ??? biotin 5 mg cap Take 1 capsule (5,000 mcg total) by mouth daily.  0   ??? cinacalcet (SENSIPAR) 60 MG tablet Take 3 tablets (180 mg total) by mouth daily. 90 tablet 11   ??? dapsone 100 MG tablet Take 1 tablet (100 mg total) by mouth daily. 30 tablet 4   ??? magnesium oxide-Mg AA chelate (MAGNESIUM, AMINO ACID CHELATE,) 133 mg Tab Take 3 tablets by mouth Two (2) times a day. 200 tablet 5   ??? metoprolol tartrate (LOPRESSOR) 25 MG tablet Take 1 tablet (25 mg total) by mouth Two (2) times a day. 60 tablet 11   ??? MYFORTIC 180 mg EC tablet Take 3 tablets (540 mg total) by mouth Two (2) times a day. 180 tablet 11   ??? omeprazole 20 mg tablet Take 1 tablet (20 mg total) by mouth daily as needed. 30 tablet 11   ??? tacrolimus (PROGRAF) 1 MG capsule Take 3 capsules (3 mg) by mouth in the morning and 2 capsules (2 mg) at night 150 capsule 11   ??? valGANciclovir (VALCYTE) 450 mg tablet Take 1 tablet (450 mg total) by mouth daily. 30 tablet 2     No current facility-administered medications for this visit.         Changes to medications: Louis Dennis reports no changes at this time.    No Known Allergies    Changes to allergies: No    SPECIALTY MEDICATION ADHERENCE     Cinacalcet 60  mg: 12 days of medicine on hand       Medication Adherence    Patient reported X missed doses in the last month: 0  Specialty Medication: Cinacalcet 60mg   Patient is on additional specialty medications: No          Specialty medication(s) dose(s) confirmed: Regimen is correct and unchanged.     Are there any concerns with adherence? No    Adherence counseling provided? Not needed    CLINICAL MANAGEMENT AND INTERVENTION      Clinical Benefit Assessment:    Do you feel the medicine is effective or helping your condition? Yes    Clinical Benefit counseling provided? Not needed    Adverse Effects Assessment:    Are you experiencing any side effects? No    Are you experiencing difficulty administering your medicine? No    Quality of Life Assessment:    How many days over the past month did your kidney transplant  keep you from your normal activities? For example, brushing your teeth or getting up in the morning. 0    Have you discussed this with your provider? Not needed    Therapy Appropriateness:    Is therapy appropriate? Yes, therapy is appropriate and should be continued    DISEASE/MEDICATION-SPECIFIC INFORMATION  N/A    PATIENT SPECIFIC NEEDS     ? Does the patient have any physical, cognitive, or cultural barriers? No    ? Is the patient high risk? Yes, patient taking a REMS drug     ? Does the patient require a Care Management Plan? No     ? Does the patient require physician intervention or other additional services (i.e. nutrition, smoking cessation, social work)? No      SHIPPING     Specialty Medication(s) to be Shipped:   Transplant: Cinacalcet 60mg     Other medication(s) to be shipped: ASA     Changes to insurance: No    Delivery Scheduled: Yes, Expected medication delivery date: 07/09/2019.     Medication will be delivered via UPS to the confirmed home address in [facility].    The patient will receive a drug information handout for each medication shipped and additional FDA Medication Guides as required.  Verified that patient has previously received a Conservation officer, historic buildings.    All of the patient's questions and concerns have been addressed.    Tera Helper   [facility] Specialty Pharmacist

## 2019-07-09 LAB — VITAMIN D 1,25-DIHYDROXY: 1,25-Dihydroxyvitamin D:MCnc:Pt:Ser/Plas:Qn:: 43

## 2019-07-09 NOTE — Unmapped (Signed)
[facility] Clinical Assessment & Refill Coordination Note    Louis Dennis, Louis Dennis: 1961/03/31  Phone: [phone] (home)     All above HIPAA information was verified with patient.     Specialty Medication(s):   Transplant: Myfortic 180mg , tacrolimus 1mg , valgancyclovir 450mg  and Cinacalcet 60mg      Current Outpatient Medications   Medication Sig Dispense Refill   ??? acetaminophen (TYLENOL) 500 MG tablet Take 1-2 tablets (500-1,000 mg total) by mouth every six (6) hours as needed for pain. 100 tablet 0   ??? allopurinol (ZYLOPRIM) 100 MG tablet Take 100 mg by mouth daily.     ??? aspirin (ECOTRIN) 81 MG tablet Take 1 tablet (81 mg total) by mouth daily. 30 tablet 11   ??? biotin 5 mg cap Take 1 capsule (5,000 mcg total) by mouth daily.  0   ??? cinacalcet (SENSIPAR) 60 MG tablet Take 3 tablets (180 mg total) by mouth daily. 90 tablet 11   ??? dapsone 100 MG tablet Take 1 tablet (100 mg total) by mouth daily. 30 tablet 4   ??? magnesium oxide-Mg AA chelate (MAGNESIUM, AMINO ACID CHELATE,) 133 mg Tab Take 3 tablets by mouth Two (2) times a day. 200 tablet 5   ??? metoprolol tartrate (LOPRESSOR) 25 MG tablet Take 1 tablet (25 mg total) by mouth Two (2) times a day. 60 tablet 11   ??? MYFORTIC 180 mg EC tablet Take 3 tablets (540 mg total) by mouth Two (2) times a day. 180 tablet 11   ??? omeprazole 20 mg tablet Take 1 tablet (20 mg total) by mouth daily as needed. 30 tablet 11   ??? tacrolimus (PROGRAF) 1 MG capsule Take 3 capsules (3 mg) by mouth in the morning and 2 capsules (2 mg) at night 150 capsule 11   ??? valGANciclovir (VALCYTE) 450 mg tablet Take 1 tablet (450 mg total) by mouth daily. 30 tablet 2     No current facility-administered medications for this visit.         Changes to medications: Louis Dennis reports no changes at this time.    No Known Allergies    Changes to allergies: No    SPECIALTY MEDICATION ADHERENCE     Myfortic 180 mg: 14 days of medicine on hand   Tacrolimus 1 mg: 14 days of medicine on hand   Valganciclovir 450 mg: 14 days of medicine on hand   Cinacalcet 60 mg: 30 days of medicine on hand       Medication Adherence    Patient reported X missed doses in the last month: 0  Specialty Medication: Myfortic 180mg   Patient is on additional specialty medications: Yes  Additional Specialty Medications: Tacrolimus 1mg   Patient Reported Additional Medication X Missed Doses in the Last Month: 0  Patient is on more than two specialty medications: Yes  Specialty Medication: Valganciclovir 450mg   Patient Reported Additional Medication X Missed Doses in the Last Month: 0  Specialty Medication: Cinacalcet 60mg   Patient Reported Additional Medication X Missed Doses in the Last Month: 0          Specialty medication(s) dose(s) confirmed: Regimen is correct and unchanged.     Are there any concerns with adherence? No    Adherence counseling provided? Not needed    CLINICAL MANAGEMENT AND INTERVENTION      Clinical Benefit Assessment:    Do you feel the medicine is effective or helping your condition? Yes    Clinical Benefit counseling provided? Not needed  Adverse Effects Assessment:    Are you experiencing any side effects? No    Are you experiencing difficulty administering your medicine? No    Quality of Life Assessment:    How many days over the past month did your kidney transplant  keep you from your normal activities? For example, brushing your teeth or getting up in the morning. 0    Have you discussed this with your provider? Not needed    Therapy Appropriateness:    Is therapy appropriate? Yes, therapy is appropriate and should be continued    DISEASE/MEDICATION-SPECIFIC INFORMATION      N/A    PATIENT SPECIFIC NEEDS     ? Does the patient have any physical, cognitive, or cultural barriers? No    ? Is the patient high risk? Yes, patient taking a REMS drug     ? Does the patient require a Care Management Plan? No     ? Does the patient require physician intervention or other additional services (i.e. nutrition, smoking cessation, social work)? No      SHIPPING     Specialty Medication(s) to be Shipped:   Transplant: Patient does not need any medicine at this time.  Asked to be called back in a week.    Other medication(s) to be shipped: none     Changes to insurance: No    Delivery Scheduled: Patient declined refill at this time due to has 2 weeks of medicine on hand..     Medication will be delivered via UPS to the confirmed home address in [facility].    The patient will receive a drug information handout for each medication shipped and additional FDA Medication Guides as required.  Verified that patient has previously received a Conservation officer, historic buildings.    All of the patient's questions and concerns have been addressed.    Tera Helper   [facility] Specialty Pharmacist

## 2019-07-10 LAB — CMV DNA, QUANTITATIVE, PCR

## 2019-07-10 LAB — CMV QUANT LOG10: Lab: 0

## 2019-07-11 ENCOUNTER — Ambulatory Visit: Admit: 2019-07-11 | Discharge: 2019-07-12 | Payer: MEDICARE

## 2019-07-11 DIAGNOSIS — Z940 Kidney transplant status: Principal | ICD-10-CM

## 2019-07-11 DIAGNOSIS — D899 Disorder involving the immune mechanism, unspecified: Secondary | ICD-10-CM

## 2019-07-11 LAB — RENAL FUNCTION PANEL
ALBUMIN: 4.6 g/dL (ref 3.5–5.0)
ANION GAP: 11 mmol/L (ref 7–15)
BUN / CREAT RATIO: 32
CALCIUM: 12 mg/dL — ABNORMAL HIGH (ref 8.5–10.2)
CHLORIDE: 103 mmol/L (ref 98–107)
CO2: 24 mmol/L (ref 22.0–30.0)
CREATININE: 0.69 mg/dL — ABNORMAL LOW (ref 0.70–1.30)
EGFR CKD-EPI AA MALE: >90 mL/min/{1.73_m2} (ref >=60)
EGFR CKD-EPI NON-AA MALE: >90 mL/min/{1.73_m2} (ref >=60)
GLUCOSE RANDOM: 118 mg/dL (ref 70–179)
PHOSPHORUS: 2.3 mg/dL — ABNORMAL LOW (ref 2.9–4.7)
POTASSIUM: 5.1 mmol/L — ABNORMAL HIGH (ref 3.5–5.0)
SODIUM: 138 mmol/L (ref 135–145)

## 2019-07-11 LAB — CBC W/ AUTO DIFF
BASOPHILS ABSOLUTE COUNT: 0 10*9/L (ref 0.0–0.1)
BASOPHILS RELATIVE PERCENT: 0.9 %
EOSINOPHILS ABSOLUTE COUNT: 0.2 10*9/L (ref 0.0–0.4)
EOSINOPHILS RELATIVE PERCENT: 4.6 %
HEMATOCRIT: 37 % — ABNORMAL LOW (ref 41.0–53.0)
HEMOGLOBIN: 11.8 g/dL — ABNORMAL LOW (ref 13.5–17.5)
LARGE UNSTAINED CELLS: 1 % (ref 0–4)
LYMPHOCYTES ABSOLUTE COUNT: 0 10*9/L — ABNORMAL LOW (ref 1.5–5.0)
LYMPHOCYTES RELATIVE PERCENT: 1.1 %
MEAN CORPUSCULAR HEMOGLOBIN CONC: 31.8 g/dL (ref 31.0–37.0)
MEAN PLATELET VOLUME: 7 fL (ref 7.0–10.0)
MONOCYTES ABSOLUTE COUNT: 0.2 10*9/L (ref 0.2–0.8)
MONOCYTES RELATIVE PERCENT: 4.2 %
NEUTROPHILS ABSOLUTE COUNT: 3.5 10*9/L (ref 2.0–7.5)
NEUTROPHILS RELATIVE PERCENT: 88.1 %
PLATELET COUNT: 245 10*9/L (ref 150–440)
RED CELL DISTRIBUTION WIDTH: 17 % — ABNORMAL HIGH (ref 12.0–15.0)
WBC ADJUSTED: 3.9 10*9/L — ABNORMAL LOW (ref 4.5–11.0)

## 2019-07-11 LAB — PLATELET COUNT: Platelets:NCnc:Pt:Bld:Qn:Automated count: 245

## 2019-07-11 LAB — EGFR CKD-EPI AA MALE: Lab: >90

## 2019-07-11 LAB — TACROLIMUS BLOOD: Lab: 9.6

## 2019-07-11 LAB — MAGNESIUM: Magnesium:MCnc:Pt:Ser/Plas:Qn:: 1.4 — ABNORMAL LOW

## 2019-07-13 LAB — CMV QUANT LOG10: Lab: 0

## 2019-07-13 LAB — CMV DNA, QUANTITATIVE, PCR: CMV VIRAL LD: NOT DETECTED

## 2019-07-15 ENCOUNTER — Ambulatory Visit: Admit: 2019-07-15 | Discharge: 2019-07-16 | Payer: MEDICARE

## 2019-07-15 DIAGNOSIS — Z940 Kidney transplant status: Principal | ICD-10-CM

## 2019-07-15 DIAGNOSIS — D899 Disorder involving the immune mechanism, unspecified: Secondary | ICD-10-CM

## 2019-07-15 LAB — RENAL FUNCTION PANEL
ALBUMIN: 4.7 g/dL (ref 3.5–5.0)
ANION GAP: 11 mmol/L (ref 7–15)
BLOOD UREA NITROGEN: 22 mg/dL — ABNORMAL HIGH (ref 7–21)
BUN / CREAT RATIO: 28
CALCIUM: 11.9 mg/dL — ABNORMAL HIGH (ref 8.5–10.2)
CHLORIDE: 102 mmol/L (ref 98–107)
CO2: 23 mmol/L (ref 22.0–30.0)
CREATININE: 0.78 mg/dL (ref 0.70–1.30)
EGFR CKD-EPI AA MALE: >90 mL/min/{1.73_m2} (ref >=60)
EGFR CKD-EPI NON-AA MALE: >90 mL/min/{1.73_m2} (ref >=60)
GLUCOSE RANDOM: 122 mg/dL (ref 70–179)
PHOSPHORUS: 2.3 mg/dL — ABNORMAL LOW (ref 2.9–4.7)

## 2019-07-15 LAB — ALBUMIN: Albumin:MCnc:Pt:Ser/Plas:Qn:: 4.7

## 2019-07-15 LAB — CBC W/ AUTO DIFF
BASOPHILS ABSOLUTE COUNT: 0 10*9/L (ref 0.0–0.1)
BASOPHILS RELATIVE PERCENT: 0.7 %
EOSINOPHILS ABSOLUTE COUNT: 0.2 10*9/L (ref 0.0–0.4)
EOSINOPHILS RELATIVE PERCENT: 4.9 %
HEMOGLOBIN: 11.9 g/dL — ABNORMAL LOW (ref 13.5–17.5)
LARGE UNSTAINED CELLS: 1 % (ref 0–4)
LYMPHOCYTES RELATIVE PERCENT: 1.9 %
MEAN CORPUSCULAR HEMOGLOBIN CONC: 31.3 g/dL (ref 31.0–37.0)
MEAN CORPUSCULAR HEMOGLOBIN: 31.4 pg (ref 26.0–34.0)
MEAN CORPUSCULAR VOLUME: 100.1 fL — ABNORMAL HIGH (ref 80.0–100.0)
MONOCYTES ABSOLUTE COUNT: 0.3 10*9/L (ref 0.2–0.8)
MONOCYTES RELATIVE PERCENT: 5.9 %
NEUTROPHILS ABSOLUTE COUNT: 3.7 10*9/L (ref 2.0–7.5)
NEUTROPHILS RELATIVE PERCENT: 85.3 %
PLATELET COUNT: 251 10*9/L (ref 150–440)
RED BLOOD CELL COUNT: 3.78 10*12/L — ABNORMAL LOW (ref 4.50–5.90)
RED CELL DISTRIBUTION WIDTH: 17.2 % — ABNORMAL HIGH (ref 12.0–15.0)
WBC ADJUSTED: 4.3 10*9/L — ABNORMAL LOW (ref 4.5–11.0)

## 2019-07-15 LAB — TACROLIMUS BLOOD: Lab: 8

## 2019-07-15 LAB — PLATELET COUNT: Platelets:NCnc:Pt:Bld:Qn:Automated count: 251

## 2019-07-15 LAB — MAGNESIUM: Magnesium:MCnc:Pt:Ser/Plas:Qn:: 1.4 — ABNORMAL LOW

## 2019-07-16 LAB — CMV DNA, QUANTITATIVE, PCR

## 2019-07-16 LAB — CMV VIRAL LD: Lab: NOT DETECTED

## 2019-07-17 NOTE — Unmapped (Signed)
left message using interpreter [phone], called to schedule4 week follow up from 7/30 appt with Dr Gwynneth Munson. can be scheduled from Recall tab

## 2019-07-17 NOTE — Unmapped (Signed)
[facility] Refill Coordination Note    Specialty Medication(s) to be Shipped:   Transplant: Myfortic 180mg , tacrolimus 1mg  and valgancyclovir 450mg     Other medication(s) to be shipped:   Dapsone 100mg   Metoprolol 25mg   Mag 133mg      Louis Dennis, DOB: 15-Apr-1961  Phone: [phone] (home)       All above HIPAA information was verified with patient.     Completed refill call assessment today to schedule patient's medication shipment from the [facility] [phone]).       Specialty medication(s) and dose(s) confirmed: Regimen is correct and unchanged.   Changes to medications: [name] reports no changes at this time.  Changes to insurance: No  Questions for the pharmacist: No    Confirmed patient received Welcome Packet with first shipment. The patient will receive a drug information handout for each medication shipped and additional FDA Medication Guides as required.       DISEASE/MEDICATION-SPECIFIC INFORMATION        N/A    SPECIALTY MEDICATION ADHERENCE        Myfortic 180 mg: 10 days of medicine on hand   Tacrolimus 1 mg: 10 days of medicine on hand   Valganciclovir 450 mg: 10 days of medicine on hand           SHIPPING     Shipping address confirmed in Epic.     Delivery Scheduled: Yes, Expected medication delivery date: 07/23/19.     Medication will be delivered via Next Day Courier to the home address in [employer] [place].    Louis Dennis   [facility] Specialty Technician

## 2019-07-17 NOTE — Unmapped (Signed)
Ambulatory referral to Endocrinology ordered per Dr. Gwynneth Munson. Updated pt on all results from testing for hypercalcemia and need for referral using [facility] in [place] [phone]).

## 2019-07-18 ENCOUNTER — Ambulatory Visit: Admit: 2019-07-18 | Discharge: 2019-07-19 | Payer: MEDICARE

## 2019-07-18 DIAGNOSIS — D899 Disorder involving the immune mechanism, unspecified: Secondary | ICD-10-CM

## 2019-07-18 DIAGNOSIS — Z940 Kidney transplant status: Principal | ICD-10-CM

## 2019-07-18 LAB — CBC W/ AUTO DIFF
BASOPHILS ABSOLUTE COUNT: 0 10*9/L (ref 0.0–0.1)
BASOPHILS RELATIVE PERCENT: 0.6 %
EOSINOPHILS RELATIVE PERCENT: 4.9 %
HEMATOCRIT: 35.1 % — ABNORMAL LOW (ref 41.0–53.0)
HEMOGLOBIN: 11.4 g/dL — ABNORMAL LOW (ref 13.5–17.5)
LARGE UNSTAINED CELLS: 1 % (ref 0–4)
LYMPHOCYTES ABSOLUTE COUNT: 0.1 10*9/L — ABNORMAL LOW (ref 1.5–5.0)
LYMPHOCYTES RELATIVE PERCENT: 2.1 %
MEAN CORPUSCULAR HEMOGLOBIN CONC: 32.3 g/dL (ref 31.0–37.0)
MEAN CORPUSCULAR HEMOGLOBIN: 32.1 pg (ref 26.0–34.0)
MEAN CORPUSCULAR VOLUME: 99.2 fL (ref 80.0–100.0)
MEAN PLATELET VOLUME: 7 fL (ref 7.0–10.0)
MONOCYTES ABSOLUTE COUNT: 0.2 10*9/L (ref 0.2–0.8)
MONOCYTES RELATIVE PERCENT: 5.5 %
NEUTROPHILS RELATIVE PERCENT: 85.9 %
PLATELET COUNT: 241 10*9/L (ref 150–440)
RED BLOOD CELL COUNT: 3.54 10*12/L — ABNORMAL LOW (ref 4.50–5.90)
RED CELL DISTRIBUTION WIDTH: 16.8 % — ABNORMAL HIGH (ref 12.0–15.0)
WBC ADJUSTED: 4.1 10*9/L — ABNORMAL LOW (ref 4.5–11.0)

## 2019-07-18 LAB — RENAL FUNCTION PANEL
ALBUMIN: 4.4 g/dL (ref 3.5–5.0)
ANION GAP: 9 mmol/L (ref 7–15)
BLOOD UREA NITROGEN: 24 mg/dL — ABNORMAL HIGH (ref 7–21)
BUN / CREAT RATIO: 31
CALCIUM: 11.4 mg/dL — ABNORMAL HIGH (ref 8.5–10.2)
CHLORIDE: 105 mmol/L (ref 98–107)
CREATININE: 0.77 mg/dL (ref 0.70–1.30)
EGFR CKD-EPI AA MALE: >90 mL/min/{1.73_m2} (ref >=60)
EGFR CKD-EPI NON-AA MALE: >90 mL/min/{1.73_m2} (ref >=60)
PHOSPHORUS: 2.3 mg/dL — ABNORMAL LOW (ref 2.9–4.7)
POTASSIUM: 5.2 mmol/L — ABNORMAL HIGH (ref 3.5–5.0)
SODIUM: 137 mmol/L (ref 135–145)

## 2019-07-18 LAB — TACROLIMUS BLOOD: Lab: 7.4

## 2019-07-18 LAB — MAGNESIUM: Magnesium:MCnc:Pt:Ser/Plas:Qn:: 1.3 — ABNORMAL LOW

## 2019-07-18 LAB — NEUTROPHILS ABSOLUTE COUNT: Lab: 3.5

## 2019-07-18 LAB — ALBUMIN: Albumin:MCnc:Pt:Ser/Plas:Qn:: 4.4

## 2019-07-22 ENCOUNTER — Ambulatory Visit: Admit: 2019-07-22 | Discharge: 2019-07-23 | Payer: MEDICARE

## 2019-07-22 DIAGNOSIS — Z940 Kidney transplant status: Principal | ICD-10-CM

## 2019-07-22 DIAGNOSIS — D899 Disorder involving the immune mechanism, unspecified: Secondary | ICD-10-CM

## 2019-07-22 LAB — CBC W/ AUTO DIFF
BASOPHILS ABSOLUTE COUNT: 0 10*9/L (ref 0.0–0.1)
BASOPHILS RELATIVE PERCENT: 0.9 %
EOSINOPHILS ABSOLUTE COUNT: 0.3 10*9/L (ref 0.0–0.4)
EOSINOPHILS RELATIVE PERCENT: 6 %
HEMOGLOBIN: 11.7 g/dL — ABNORMAL LOW (ref 13.5–17.5)
LARGE UNSTAINED CELLS: 1 % (ref 0–4)
LYMPHOCYTES RELATIVE PERCENT: 2.5 %
MEAN CORPUSCULAR HEMOGLOBIN CONC: 31.7 g/dL (ref 31.0–37.0)
MEAN CORPUSCULAR VOLUME: 100.3 fL — ABNORMAL HIGH (ref 80.0–100.0)
MEAN PLATELET VOLUME: 7.2 fL (ref 7.0–10.0)
MONOCYTES ABSOLUTE COUNT: 0.2 10*9/L (ref 0.2–0.8)
MONOCYTES RELATIVE PERCENT: 5.2 %
NEUTROPHILS ABSOLUTE COUNT: 3.4 10*9/L (ref 2.0–7.5)
NEUTROPHILS RELATIVE PERCENT: 84.3 %
PLATELET COUNT: 249 10*9/L (ref 150–440)
RED BLOOD CELL COUNT: 3.68 10*12/L — ABNORMAL LOW (ref 4.50–5.90)
RED CELL DISTRIBUTION WIDTH: 16.5 % — ABNORMAL HIGH (ref 12.0–15.0)
WBC ADJUSTED: 4.1 10*9/L — ABNORMAL LOW (ref 4.5–11.0)

## 2019-07-22 LAB — RENAL FUNCTION PANEL
ALBUMIN: 4.7 g/dL (ref 3.5–5.0)
ANION GAP: 7 mmol/L (ref 7–15)
BLOOD UREA NITROGEN: 25 mg/dL — ABNORMAL HIGH (ref 7–21)
BUN / CREAT RATIO: 36
CALCIUM: 11.4 mg/dL — ABNORMAL HIGH (ref 8.5–10.2)
CHLORIDE: 104 mmol/L (ref 98–107)
CO2: 23 mmol/L (ref 22.0–30.0)
CREATININE: 0.7 mg/dL (ref 0.70–1.30)
EGFR CKD-EPI AA MALE: >90 mL/min/{1.73_m2} (ref >=60)
EGFR CKD-EPI NON-AA MALE: >90 mL/min/{1.73_m2} (ref >=60)
GLUCOSE RANDOM: 116 mg/dL (ref 70–179)
PHOSPHORUS: 2.1 mg/dL — ABNORMAL LOW (ref 2.9–4.7)
POTASSIUM: 4.9 mmol/L (ref 3.5–5.0)

## 2019-07-22 LAB — TACROLIMUS BLOOD: Lab: 10.6

## 2019-07-22 LAB — EOSINOPHILS RELATIVE PERCENT: Lab: 6

## 2019-07-22 LAB — MAGNESIUM: Magnesium:MCnc:Pt:Ser/Plas:Qn:: 1.4 — ABNORMAL LOW

## 2019-07-22 LAB — POTASSIUM: Potassium:SCnc:Pt:Ser/Plas:Qn:: 4.9

## 2019-07-22 MED FILL — MG-PLUS-PROTEIN 133 MG TABLET: ORAL | 33 days supply | Qty: 200 | Fill #1

## 2019-07-22 MED FILL — TACROLIMUS 1 MG CAPSULE, IMMEDIATE-RELEASE: 30 days supply | Qty: 150 | Fill #1

## 2019-07-22 MED FILL — MYFORTIC 180 MG TABLET,DELAYED RELEASE: ORAL | 30 days supply | Qty: 180 | Fill #1

## 2019-07-22 MED FILL — MYFORTIC 180 MG TABLET,DELAYED RELEASE: 30 days supply | Qty: 180 | Fill #1 | Status: AC

## 2019-07-22 MED FILL — TACROLIMUS 1 MG CAPSULE: 30 days supply | Qty: 150 | Fill #1 | Status: AC

## 2019-07-22 MED FILL — METOPROLOL TARTRATE 25 MG TABLET: 30 days supply | Qty: 60 | Fill #1 | Status: AC

## 2019-07-22 MED FILL — VALGANCICLOVIR 450 MG TABLET: 30 days supply | Qty: 30 | Fill #1 | Status: AC

## 2019-07-22 MED FILL — METOPROLOL TARTRATE 25 MG TABLET: ORAL | 30 days supply | Qty: 60 | Fill #1

## 2019-07-22 MED FILL — MG-PLUS-PROTEIN 133 MG TABLET: 33 days supply | Qty: 200 | Fill #1 | Status: AC

## 2019-07-22 MED FILL — VALGANCICLOVIR 450 MG TABLET: ORAL | 30 days supply | Qty: 30 | Fill #1

## 2019-07-23 LAB — CMV DNA, QUANTITATIVE, PCR: CMV VIRAL LD: DETECTED — AB

## 2019-07-23 LAB — CMV QUANT: Lab: <50 — ABNORMAL HIGH

## 2019-07-25 ENCOUNTER — Ambulatory Visit: Admit: 2019-07-25 | Discharge: 2019-07-26 | Payer: MEDICARE

## 2019-07-25 DIAGNOSIS — D899 Disorder involving the immune mechanism, unspecified: Secondary | ICD-10-CM

## 2019-07-25 DIAGNOSIS — Z940 Kidney transplant status: Principal | ICD-10-CM

## 2019-07-25 LAB — RENAL FUNCTION PANEL
ALBUMIN: 4.8 g/dL (ref 3.5–5.0)
ANION GAP: 9 mmol/L (ref 7–15)
BLOOD UREA NITROGEN: 26 mg/dL — ABNORMAL HIGH (ref 7–21)
BUN / CREAT RATIO: 36
CALCIUM: 11.4 mg/dL — ABNORMAL HIGH (ref 8.5–10.2)
CO2: 22 mmol/L (ref 22.0–30.0)
CREATININE: 0.73 mg/dL (ref 0.70–1.30)
EGFR CKD-EPI AA MALE: >90 mL/min/{1.73_m2} (ref >=60)
EGFR CKD-EPI NON-AA MALE: >90 mL/min/{1.73_m2} (ref >=60)
GLUCOSE RANDOM: 114 mg/dL (ref 70–179)
PHOSPHORUS: 2.2 mg/dL — ABNORMAL LOW (ref 2.9–4.7)
SODIUM: 137 mmol/L (ref 135–145)

## 2019-07-25 LAB — CBC W/ AUTO DIFF
BASOPHILS ABSOLUTE COUNT: 0 10*9/L (ref 0.0–0.1)
BASOPHILS RELATIVE PERCENT: 0.8 %
EOSINOPHILS ABSOLUTE COUNT: 0.3 10*9/L (ref 0.0–0.4)
EOSINOPHILS RELATIVE PERCENT: 7.6 %
HEMATOCRIT: 35.3 % — ABNORMAL LOW (ref 41.0–53.0)
HEMOGLOBIN: 11.6 g/dL — ABNORMAL LOW (ref 13.5–17.5)
LARGE UNSTAINED CELLS: 1 % (ref 0–4)
LYMPHOCYTES ABSOLUTE COUNT: 0.1 10*9/L — ABNORMAL LOW (ref 1.5–5.0)
MEAN CORPUSCULAR HEMOGLOBIN CONC: 32.7 g/dL (ref 31.0–37.0)
MEAN CORPUSCULAR HEMOGLOBIN: 32.8 pg (ref 26.0–34.0)
MEAN CORPUSCULAR VOLUME: 100.4 fL — ABNORMAL HIGH (ref 80.0–100.0)
MEAN PLATELET VOLUME: 7.5 fL (ref 7.0–10.0)
MONOCYTES ABSOLUTE COUNT: 0.2 10*9/L (ref 0.2–0.8)
MONOCYTES RELATIVE PERCENT: 4.3 %
NEUTROPHILS ABSOLUTE COUNT: 3.2 10*9/L (ref 2.0–7.5)
NEUTROPHILS RELATIVE PERCENT: 83.9 %
RED BLOOD CELL COUNT: 3.52 10*12/L — ABNORMAL LOW (ref 4.50–5.90)
RED CELL DISTRIBUTION WIDTH: 16.4 % — ABNORMAL HIGH (ref 12.0–15.0)
WBC ADJUSTED: 3.8 10*9/L — ABNORMAL LOW (ref 4.5–11.0)

## 2019-07-25 LAB — TACROLIMUS BLOOD: Lab: 6.8

## 2019-07-25 LAB — ANISOCYTOSIS

## 2019-07-25 LAB — MAGNESIUM: Magnesium:MCnc:Pt:Ser/Plas:Qn:: 1.5 — ABNORMAL LOW

## 2019-07-25 LAB — ANION GAP: Anion gap 3:SCnc:Pt:Ser/Plas:Qn:: 9

## 2019-07-26 LAB — CMV DNA, QUANTITATIVE, PCR: CMV VIRAL LD: NOT DETECTED

## 2019-07-26 LAB — CMV VIRAL LD: Lab: NOT DETECTED

## 2019-07-29 ENCOUNTER — Ambulatory Visit: Admit: 2019-07-29 | Discharge: 2019-07-30 | Payer: MEDICARE

## 2019-07-29 DIAGNOSIS — Z940 Kidney transplant status: Principal | ICD-10-CM

## 2019-07-29 DIAGNOSIS — D899 Disorder involving the immune mechanism, unspecified: Secondary | ICD-10-CM

## 2019-07-29 LAB — CBC W/ AUTO DIFF
BASOPHILS ABSOLUTE COUNT: 0 10*9/L (ref 0.0–0.1)
BASOPHILS RELATIVE PERCENT: 1.1 %
EOSINOPHILS ABSOLUTE COUNT: 0.3 10*9/L (ref 0.0–0.4)
EOSINOPHILS RELATIVE PERCENT: 6.9 %
HEMATOCRIT: 38.3 % — ABNORMAL LOW (ref 41.0–53.0)
LARGE UNSTAINED CELLS: 2 % (ref 0–4)
LYMPHOCYTES ABSOLUTE COUNT: 0.1 10*9/L — ABNORMAL LOW (ref 1.5–5.0)
LYMPHOCYTES RELATIVE PERCENT: 2 %
MEAN CORPUSCULAR HEMOGLOBIN CONC: 32.1 g/dL (ref 31.0–37.0)
MEAN CORPUSCULAR HEMOGLOBIN: 32.4 pg (ref 26.0–34.0)
MEAN CORPUSCULAR VOLUME: 100.7 fL — ABNORMAL HIGH (ref 80.0–100.0)
MONOCYTES ABSOLUTE COUNT: 0.2 10*9/L (ref 0.2–0.8)
MONOCYTES RELATIVE PERCENT: 5.5 %
NEUTROPHILS ABSOLUTE COUNT: 3.1 10*9/L (ref 2.0–7.5)
PLATELET COUNT: 235 10*9/L (ref 150–440)
RED CELL DISTRIBUTION WIDTH: 16.3 % — ABNORMAL HIGH (ref 12.0–15.0)
WBC ADJUSTED: 3.7 10*9/L — ABNORMAL LOW (ref 4.5–11.0)

## 2019-07-29 LAB — RENAL FUNCTION PANEL
ANION GAP: 8 mmol/L (ref 7–15)
BLOOD UREA NITROGEN: 22 mg/dL — ABNORMAL HIGH (ref 7–21)
BUN / CREAT RATIO: 29
CALCIUM: 11.8 mg/dL — ABNORMAL HIGH (ref 8.5–10.2)
CHLORIDE: 104 mmol/L (ref 98–107)
CO2: 23 mmol/L (ref 22.0–30.0)
EGFR CKD-EPI AA MALE: >90 mL/min/{1.73_m2} (ref >=60)
EGFR CKD-EPI NON-AA MALE: >90 mL/min/{1.73_m2} (ref >=60)
GLUCOSE RANDOM: 115 mg/dL (ref 70–179)
PHOSPHORUS: 2.5 mg/dL — ABNORMAL LOW (ref 2.9–4.7)
POTASSIUM: 5.1 mmol/L — ABNORMAL HIGH (ref 3.5–5.0)
SODIUM: 135 mmol/L (ref 135–145)

## 2019-07-29 LAB — MAGNESIUM
MAGNESIUM: 1.6 mg/dL (ref 1.6–2.2)
Magnesium:MCnc:Pt:Ser/Plas:Qn:: 1.6

## 2019-07-29 LAB — MEAN CORPUSCULAR HEMOGLOBIN: Lab: 32.4

## 2019-07-29 LAB — CO2: Carbon dioxide:SCnc:Pt:Ser/Plas:Qn:: 23

## 2019-07-29 LAB — TACROLIMUS BLOOD: Lab: 7.6

## 2019-07-29 MED ORDER — ALLOPURINOL 100 MG TABLET
ORAL_TABLET | Freq: Every day | ORAL | 11 refills | 30 days | Status: CP
Start: 2019-07-29 — End: 2020-07-28
  Filled 2019-07-29: qty 30, 30d supply, fill #0

## 2019-07-29 MED FILL — DAPSONE 100 MG TABLET: 30 days supply | Qty: 30 | Fill #0 | Status: AC

## 2019-07-29 MED FILL — DAPSONE 100 MG TABLET: ORAL | 30 days supply | Qty: 30 | Fill #0

## 2019-07-29 MED FILL — ASPIRIN 81 MG TABLET,DELAYED RELEASE: ORAL | 30 days supply | Qty: 30 | Fill #2

## 2019-07-29 MED FILL — ASPIRIN 81 MG TABLET,DELAYED RELEASE: 30 days supply | Qty: 30 | Fill #2 | Status: AC

## 2019-07-29 MED FILL — ALLOPURINOL 100 MG TABLET: 30 days supply | Qty: 30 | Fill #0 | Status: AC

## 2019-07-29 NOTE — Unmapped (Signed)
[facility] Refill Coordination Note    Specialty Medication(s) to be Shipped:   Transplant: Sensipar 60mg     Other medication(s) to be shipped: ASA, Dapsone, Allopurinol     Louis Dennis, DOB: 1961/04/06  Phone: [phone] (home)       All above HIPAA information was verified with patient.     Completed refill call assessment today to schedule patient's medication shipment from the [facility] [phone]).       Specialty medication(s) and dose(s) confirmed: Regimen is correct and unchanged.   Changes to medications: [name] reports no changes at this time.  Changes to insurance: No  Questions for the pharmacist: No    Confirmed patient received Welcome Packet with first shipment. The patient will receive a drug information handout for each medication shipped and additional FDA Medication Guides as required.       DISEASE/MEDICATION-SPECIFIC INFORMATION        N/A    SPECIALTY MEDICATION ADHERENCE                Sensipar 60 mg: 11 days of medicine on hand        SHIPPING     Shipping address confirmed in Epic.     Delivery Scheduled: Yes, Expected medication delivery date: Dapsone, Allopurinol, ASA- 07/30/2019, Sensipar 60mg -08/02/2019.     Medication will be delivered via UPS to the home address in [employer] [facility].    Tera Helper   [facility] Specialty Pharmacist

## 2019-08-01 ENCOUNTER — Ambulatory Visit: Admit: 2019-08-01 | Discharge: 2019-08-02 | Payer: MEDICARE

## 2019-08-01 DIAGNOSIS — Z940 Kidney transplant status: Principal | ICD-10-CM

## 2019-08-01 DIAGNOSIS — D899 Disorder involving the immune mechanism, unspecified: Secondary | ICD-10-CM

## 2019-08-01 LAB — RENAL FUNCTION PANEL
ALBUMIN: 4.8 g/dL (ref 3.5–5.0)
ANION GAP: 7 mmol/L (ref 7–15)
BLOOD UREA NITROGEN: 27 mg/dL — ABNORMAL HIGH (ref 7–21)
BUN / CREAT RATIO: 36
CALCIUM: 11.1 mg/dL — ABNORMAL HIGH (ref 8.5–10.2)
CHLORIDE: 106 mmol/L (ref 98–107)
EGFR CKD-EPI AA MALE: >90 mL/min/{1.73_m2} (ref >=60)
EGFR CKD-EPI NON-AA MALE: >90 mL/min/{1.73_m2} (ref >=60)
GLUCOSE RANDOM: 109 mg/dL (ref 70–179)
PHOSPHORUS: 2.4 mg/dL — ABNORMAL LOW (ref 2.9–4.7)
POTASSIUM: 5 mmol/L (ref 3.5–5.0)
SODIUM: 135 mmol/L (ref 135–145)

## 2019-08-01 LAB — CBC W/ AUTO DIFF
BASOPHILS ABSOLUTE COUNT: 0 10*9/L (ref 0.0–0.1)
BASOPHILS RELATIVE PERCENT: 0.7 %
EOSINOPHILS ABSOLUTE COUNT: 0.2 10*9/L (ref 0.0–0.4)
EOSINOPHILS RELATIVE PERCENT: 7.3 %
HEMATOCRIT: 36.6 % — ABNORMAL LOW (ref 41.0–53.0)
LYMPHOCYTES ABSOLUTE COUNT: 0.1 10*9/L — ABNORMAL LOW (ref 1.5–5.0)
LYMPHOCYTES RELATIVE PERCENT: 2.1 %
MEAN CORPUSCULAR HEMOGLOBIN CONC: 31.6 g/dL (ref 31.0–37.0)
MEAN CORPUSCULAR HEMOGLOBIN: 32.1 pg (ref 26.0–34.0)
MEAN PLATELET VOLUME: 7.6 fL (ref 7.0–10.0)
MONOCYTES ABSOLUTE COUNT: 0.2 10*9/L (ref 0.2–0.8)
MONOCYTES RELATIVE PERCENT: 5.1 %
NEUTROPHILS ABSOLUTE COUNT: 2.7 10*9/L (ref 2.0–7.5)
NEUTROPHILS RELATIVE PERCENT: 83.5 %
PLATELET COUNT: 212 10*9/L (ref 150–440)
RED BLOOD CELL COUNT: 3.6 10*12/L — ABNORMAL LOW (ref 4.50–5.90)
RED CELL DISTRIBUTION WIDTH: 15.9 % — ABNORMAL HIGH (ref 12.0–15.0)
WBC ADJUSTED: 3.3 10*9/L — ABNORMAL LOW (ref 4.5–11.0)

## 2019-08-01 LAB — CMV DNA, QUANTITATIVE, PCR

## 2019-08-01 LAB — CO2: Carbon dioxide:SCnc:Pt:Ser/Plas:Qn:: 22

## 2019-08-01 LAB — TACROLIMUS BLOOD: Lab: 6.7

## 2019-08-01 LAB — MEAN PLATELET VOLUME: Lab: 7.6

## 2019-08-01 LAB — MAGNESIUM: Magnesium:MCnc:Pt:Ser/Plas:Qn:: 1.6

## 2019-08-01 LAB — CMV QUANT LOG10: Lab: 0

## 2019-08-01 MED FILL — SENSIPAR 60 MG TABLET: ORAL | 28 days supply | Qty: 85 | Fill #1

## 2019-08-01 MED FILL — SENSIPAR 60 MG TABLET: 28 days supply | Qty: 85 | Fill #1 | Status: AC

## 2019-08-05 ENCOUNTER — Ambulatory Visit: Admit: 2019-08-05 | Discharge: 2019-08-06 | Payer: MEDICARE

## 2019-08-05 DIAGNOSIS — Z940 Kidney transplant status: Principal | ICD-10-CM

## 2019-08-05 DIAGNOSIS — D899 Disorder involving the immune mechanism, unspecified: Secondary | ICD-10-CM

## 2019-08-05 LAB — CBC W/ AUTO DIFF
BASOPHILS ABSOLUTE COUNT: 0 10*9/L (ref 0.0–0.1)
EOSINOPHILS ABSOLUTE COUNT: 0.3 10*9/L (ref 0.0–0.4)
EOSINOPHILS RELATIVE PERCENT: 6.9 %
HEMATOCRIT: 36.4 % — ABNORMAL LOW (ref 41.0–53.0)
HEMOGLOBIN: 11.5 g/dL — ABNORMAL LOW (ref 13.5–17.5)
LARGE UNSTAINED CELLS: 2 % (ref 0–4)
LYMPHOCYTES ABSOLUTE COUNT: 0.1 10*9/L — ABNORMAL LOW (ref 1.5–5.0)
LYMPHOCYTES RELATIVE PERCENT: 2.7 %
MEAN CORPUSCULAR HEMOGLOBIN CONC: 31.6 g/dL (ref 31.0–37.0)
MEAN CORPUSCULAR VOLUME: 102 fL — ABNORMAL HIGH (ref 80.0–100.0)
MEAN PLATELET VOLUME: 7.4 fL (ref 7.0–10.0)
MONOCYTES ABSOLUTE COUNT: 0.2 10*9/L (ref 0.2–0.8)
MONOCYTES RELATIVE PERCENT: 4.4 %
NEUTROPHILS ABSOLUTE COUNT: 3.2 10*9/L (ref 2.0–7.5)
NEUTROPHILS RELATIVE PERCENT: 83.5 %
PLATELET COUNT: 219 10*9/L (ref 150–440)
RED BLOOD CELL COUNT: 3.57 10*12/L — ABNORMAL LOW (ref 4.50–5.90)
RED CELL DISTRIBUTION WIDTH: 15.8 % — ABNORMAL HIGH (ref 12.0–15.0)
WBC ADJUSTED: 3.8 10*9/L — ABNORMAL LOW (ref 4.5–11.0)

## 2019-08-05 LAB — RENAL FUNCTION PANEL
ANION GAP: 7 mmol/L (ref 7–15)
BLOOD UREA NITROGEN: 25 mg/dL — ABNORMAL HIGH (ref 7–21)
BUN / CREAT RATIO: 34
CALCIUM: 11.1 mg/dL — ABNORMAL HIGH (ref 8.5–10.2)
CHLORIDE: 108 mmol/L — ABNORMAL HIGH (ref 98–107)
CO2: 22 mmol/L (ref 22.0–30.0)
CREATININE: 0.73 mg/dL (ref 0.70–1.30)
EGFR CKD-EPI AA MALE: >90 mL/min/{1.73_m2} (ref >=60)
EGFR CKD-EPI NON-AA MALE: >90 mL/min/{1.73_m2} (ref >=60)
GLUCOSE RANDOM: 110 mg/dL (ref 70–179)
POTASSIUM: 4.7 mmol/L (ref 3.5–5.0)
SODIUM: 137 mmol/L (ref 135–145)

## 2019-08-05 LAB — TACROLIMUS BLOOD: Lab: 7.5

## 2019-08-05 LAB — MONOCYTES ABSOLUTE COUNT: Monocytes:NCnc:Pt:Bld:Qn:Automated count: 0.2

## 2019-08-05 LAB — ALBUMIN: Albumin:MCnc:Pt:Ser/Plas:Qn:: 4.8

## 2019-08-05 LAB — MAGNESIUM: Magnesium:MCnc:Pt:Ser/Plas:Qn:: 1.6

## 2019-08-05 NOTE — Unmapped (Signed)
Lab orders entered

## 2019-08-06 ENCOUNTER — Ambulatory Visit: Admit: 2019-08-06 | Discharge: 2019-08-06 | Payer: MEDICARE

## 2019-08-06 DIAGNOSIS — L659 Nonscarring hair loss, unspecified: Secondary | ICD-10-CM

## 2019-08-06 DIAGNOSIS — Z23 Encounter for immunization: Secondary | ICD-10-CM

## 2019-08-06 DIAGNOSIS — I120 Hypertensive chronic kidney disease with stage 5 chronic kidney disease or end stage renal disease: Secondary | ICD-10-CM

## 2019-08-06 DIAGNOSIS — M109 Gout, unspecified: Secondary | ICD-10-CM

## 2019-08-06 DIAGNOSIS — N186 End stage renal disease: Secondary | ICD-10-CM

## 2019-08-06 DIAGNOSIS — Z4822 Encounter for aftercare following kidney transplant: Secondary | ICD-10-CM

## 2019-08-06 DIAGNOSIS — Z79899 Other long term (current) drug therapy: Secondary | ICD-10-CM

## 2019-08-06 DIAGNOSIS — Z7982 Long term (current) use of aspirin: Secondary | ICD-10-CM

## 2019-08-06 DIAGNOSIS — D631 Anemia in chronic kidney disease: Secondary | ICD-10-CM

## 2019-08-06 DIAGNOSIS — Z940 Kidney transplant status: Secondary | ICD-10-CM

## 2019-08-06 DIAGNOSIS — D899 Disorder involving the immune mechanism, unspecified: Secondary | ICD-10-CM

## 2019-08-06 LAB — URINALYSIS
BACTERIA: NONE SEEN /HPF
BILIRUBIN UA: NEGATIVE
BLOOD UA: NEGATIVE
GLUCOSE UA: NEGATIVE
KETONES UA: NEGATIVE
LEUKOCYTE ESTERASE UA: NEGATIVE
NITRITE UA: NEGATIVE
RBC UA: <1 /HPF (ref <=3)
SPECIFIC GRAVITY UA: 1.014 (ref 1.003–1.030)
SQUAMOUS EPITHELIAL: <1 /HPF (ref 0–5)
UROBILINOGEN UA: 0.2
WBC UA: 1 /HPF (ref <=2)

## 2019-08-06 LAB — CMV DNA, QUANTITATIVE, PCR: CMV VIRAL LD: NOT DETECTED

## 2019-08-06 LAB — COLOR

## 2019-08-06 LAB — PROTEIN / CREATININE RATIO, URINE

## 2019-08-06 LAB — CMV QUANT LOG10: Lab: 0

## 2019-08-06 LAB — PROTEIN/CREAT RATIO, URINE: Protein/Creatinine:MRto:Pt:Urine:Qn:: 0

## 2019-08-06 MED ORDER — METOPROLOL TARTRATE 25 MG TABLET
ORAL_TABLET | Freq: Two times a day (BID) | ORAL | 11 refills | 30 days
Start: 2019-08-06 — End: 2019-09-09

## 2019-08-06 NOTE — Unmapped (Signed)
Call from transplant clinic about scheduling 4 week follow-up w/Dr. Gwynneth Munson.

## 2019-08-06 NOTE — Unmapped (Signed)
Transplant Nephrology Clinic Visit    History of Present Illness  [name] is a 58 y.o. 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  -Adequate perfusion of the transplant kidney. Resistive indices within the mid main renal artery and at the anastomosis are slightly elevated. Attention on follow-up.  ??    Current Immunosuppression Regimen:   - Myfortic 540mg  BID  - Tacrolimus 3mg  qAM and 2mg  qPM      Subjective/Interval:   Since last visit patient had his stent removed and referred to Endocrinology for hypercalcemia.  No ED admissions since last visit.  Patient had labs drawn yesterday. He states his lower back pain is now resolved.  Patient states his blood pressure at home has been running 105-110/60-70's and he is having lightheadedness with position changes. He denies chest pain, shortness of breath, dysuria, hematuria, n/v/d.     Last dose of Prograf: 9:00pm     Review of Systems  Otherwise as per HPI, all other systems reviewed and are negative.    Medications  Current Outpatient Medications   Medication Sig Dispense Refill   ??? acetaminophen (TYLENOL) 500 MG tablet Take 1-2 tablets (500-1,000 mg total) by mouth every six (6) hours as needed for pain. 100 tablet 0   ??? allopurinoL (ZYLOPRIM) 100 MG tablet Take 1 tablet (100 mg total) by mouth daily. 30 tablet 11   ??? aspirin (ECOTRIN) 81 MG tablet Take 1 tablet (81 mg total) by mouth daily. 30 tablet 11   ??? biotin 5 mg cap Take 1 capsule (5,000 mcg total) by mouth daily.  0   ??? cinacalcet (SENSIPAR) 60 MG tablet Take 3 tablets (180 mg total) by mouth daily. 90 tablet 11   ??? dapsone 100 MG tablet Take 1 tablet (100 mg total) by mouth daily. 30 tablet 4   ??? magnesium oxide-Mg AA chelate (MAGNESIUM, AMINO ACID CHELATE,) 133 mg Tab Take 3 tablets by mouth Two (2) times a day. 200 tablet 5   ??? metoprolol tartrate (LOPRESSOR) 25 MG tablet Take 1 tablet (25 mg total) by mouth Two (2) times a day. 60 tablet 11   ??? MYFORTIC 180 mg EC tablet Take 3 tablets (540 mg total) by mouth Two (2) times a day. 180 tablet 11   ??? tacrolimus (PROGRAF) 1 MG capsule Take 3 capsules (3 mg) by mouth in the morning and 2 capsules (2 mg) at night 150 capsule 11   ??? valGANciclovir (VALCYTE) 450 mg tablet Take 1 tablet (450 mg total) by mouth daily. 30 tablet 2     No current facility-administered medications for this visit.  Physical Exam  BP 101/66  - Pulse 72  - Temp 36.4 ??C (97.5 ??F) (Tympanic)  - Ht 160 cm (5' 3)  - Wt 64 kg (141 lb)  - BMI 24.98 kg/m??   General: Well appearing male, no acute distress  HEENT: wearing mask, EOMI, sclera anicteric  Neck: Neck supple, no cervical lymphadenopathy appreciated. No overt palpable thyroid nodules. No tenderness with palaption.  CV: normal rate, normal rhythm, no murmur, no gallops, no rubs appreciated  Lungs: clear to auscultation bilaterally, normal work of breathing.  Abdomen: soft, non tender, RLQ surgical site healed.  Extremities: no edema  Musculoskeletal: no visible deformity, normal range of motion.  Pulses: intact distally throughout  Neurologic: awake, alert, and oriented x3    Laboratory Data and Imaging reviewed in EPIC    Assessment: [name] is a 58 y.o. 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     Recommendations/Plan:   Allograft Function: renal function stable at 0.73 with a baseline of approximately 0.7-0.9 since transplant. Will continue to monitor.     Immunosuppression Management [High Risk Medical Decision Making For Drug Therapy Requiring Intensive Monitoring For Toxicity]:  Tacrolimus trough 7.5 on 08/05/19.  Targeting tacrolimus trough level of approximately 8-10. Continue current dose 3mg  AM and 2mg  PM ng/mL Continue Myfortic 540 mg BID    Blood Pressure Management: BP 101/66 at this visit. Due to symptomatic low BP at home, will decrease metoprolol to 12.5mg  BID.     Infectious Prophylaxis and Monitoring:   Transitioned from Bactrim to Dapsone on 07/04/19 due to stable mild hyperkalemia.  No decoy cells on 06/17/19, today's pending.  CMV VL not detected on 07/29/19, yesterday's pending.   The patient continues on Valcyte and Dapsone prophylaxis.     Hypercalcemia: Persistent elevated 12 - 12.5 since transplantation despite progressive up-titration of Sensipar. Reports compliance and no offending medications identified. PTH (06/24/19) 242.6.   Calcium level yesterday 11.1  - Continue Sensipar 180mg  daily  - Referred to Endocrinology 07/17/19  - L Thyroid nodule FNA (05/13/19) with benign thyroid epithelium and colloid  - Routine cancer screening up to date prior to transplant    Lower Back Pain with midline radiculopathy: Known underlying degenerative changes. Resolved per Patient.     Hypomagnesemia: Stable around 1.6 (08/05/19), likely due to tarolimus. Continue maintenance oral supplementation    Lipid Management: Lipid panel drawn 03/01/19 showed LDL 80, HDL 36, TChol 151  Will continue to monitor     Health Maintenance:   - Colonoscopy due 2029  - Vaccination: PCV-23 completed 07/06/2015. Due for PCV-13 after month 12.  -Flu shot given today    Counseling:  I counseled the patient on the need to avoid sun exposure and the use of sunblock while outdoors given the relatively higher risk of skin malignancy in an immunosuppressed state and the need for adherence to immunosuppression medication.  Patient verbalized understanding.     Follow-Up:  Return to clinic in 4 weeks at [facility]  Continue twice a week labs.  Patient will continue to follow-up with his primary care provider for non-transplant related issues and medication refills. We have ordered transplant specific labs per the center's guidelines to monitor and assess for toxicities from immunosuppressant drug therapy    Patient seen and evaluated with Dr. Gwynneth Munson who is in agreement with the plan.

## 2019-08-06 NOTE — Unmapped (Signed)
Transplant Coordinator, Clinic Visit   Pt doing well today, accompanied by wife Louis Dennis. No complaints today, medication and symptoms reviewed. Previous back pain and testicular pain resolved. Pt walking more at home.   Assessment  BP: 101/66 today, BP at home 105/68-117/70s, pt feeling lightheaded at times  Headache: occasional, slight back of head, when stands quickly  Hand tremors: no  Numbness/tingling: no  Fevers: no  Chills/sweats: no  Shortness of breath: no  Chest pain or pressure: no  Palpitations: no  Nausea/vomiting: no  Diarrhea/constipation: no  UTI symptoms: no  Swelling: no  Pain: denies  Appetite: good  Intake: 2400-2700  Output: 2400-2700  Any new medications? no    Immunosuppressant last taken: 9pm    PER H. PERRY NP: DECREASING METOPROLOL TO 12.5MG  BID AND CONTINUE LABS 2 X PER WEEK, RECEIVED FLU VACCINE TODAY.     I spent a total of 10 minutes with [name] reviewing medications and symptoms.

## 2019-08-06 NOTE — Unmapped (Signed)
[facility]IT[facility] TRANSPLANT CLINIC PHARMACY NOTE  08/06/2019   Louis Dennis  [id number]    Medication changes today:  1. Decrease metoprolol to 12.5 mg BID    Education/Adherence tools provided today:  1.  provided additional education on immunosuppression and transplant related medications including reviewing indications of medications, dosing and side effects    Follow up items:  1. goal of understanding indications and dosing of immunosuppression medications  2. Consider starting statin at a later date  3. Monitor FBG    Next visit with pharmacy in 1 month  ____________________________________________________________________    Louis Dennis is a 58 y.o. male s/p deceased kidney transplant on 06-23-2019 (Kidney) 2/2 HTN.     Other PMH significant for hypertension, gout    Seen by pharmacy today for: medication management and pill box fill and adherence education; last seen by pharmacy 2 months ago.     CC:  Patient complains of  orthostasis    There were no vitals filed for this visit.    No Known Allergies    All medications reviewed and updated.     Medication list includes revisions made during today???s encounter    Outpatient Encounter Medications as of 08/06/2019   Medication Sig Dispense Refill   ??? acetaminophen (TYLENOL) 500 MG tablet Take 1-2 tablets (500-1,000 mg total) by mouth every six (6) hours as needed for pain. 100 tablet 0   ??? allopurinoL (ZYLOPRIM) 100 MG tablet Take 1 tablet (100 mg total) by mouth daily. 30 tablet 11   ??? aspirin (ECOTRIN) 81 MG tablet Take 1 tablet (81 mg total) by mouth daily. 30 tablet 11   ??? biotin 5 mg cap Take 1 capsule (5,000 mcg total) by mouth daily.  0   ??? cinacalcet (SENSIPAR) 60 MG tablet Take 3 tablets (180 mg total) by mouth daily. 90 tablet 11   ??? dapsone 100 MG tablet Take 1 tablet (100 mg total) by mouth daily. 30 tablet 4   ??? magnesium oxide-Mg AA chelate (MAGNESIUM, AMINO ACID CHELATE,) 133 mg Tab Take 3 tablets by mouth Two (2) times a day. 200 tablet 5   ??? metoprolol tartrate (LOPRESSOR) 25 MG tablet Take 1 tablet (25 mg total) by mouth Two (2) times a day. 60 tablet 11   ??? MYFORTIC 180 mg EC tablet Take 3 tablets (540 mg total) by mouth Two (2) times a day. 180 tablet 11   ??? omeprazole 20 mg tablet Take 1 tablet (20 mg total) by mouth daily as needed. 30 tablet 11   ??? tacrolimus (PROGRAF) 1 MG capsule Take 3 capsules (3 mg) by mouth in the morning and 2 capsules (2 mg) at night 150 capsule 11   ??? valGANciclovir (VALCYTE) 450 mg tablet Take 1 tablet (450 mg total) by mouth daily. 30 tablet 2     No facility-administered encounter medications on file as of 08/06/2019.      Induction agent : alemtuzumab    CURRENT IMMUNOSUPPRESSION: tacrolimus 3 mg qAM and 2 mg qPM  prograf/Envarsus/cyclosporine goal: 8-10   myfortic540  mg PO bid    steroid free     Patient is tolerating immunosuppression well other than some hair thinning    IMMUNOSUPPRESSION DRUG LEVELS:  Lab Results   Component Value Date    Tacrolimus, Trough 8.6 07/04/2019    Tacrolimus, Trough 8.7 06/17/2019    Tacrolimus, Trough 5.3 05/27/2019    Tacrolimus, Timed 7.5 08/05/2019    Tacrolimus, Timed 6.7 08/01/2019  Tacrolimus, Timed 7.6 07/29/2019     No results found for: CYCLO  No results found for: EVEROLIMUS  No results found for: SIROLIMUS    Did not have Prograf level drawn today    Graft function: stable   DSA: ntd  Biopsies to date: ntd  WBC/ANC:  wnl    Plan: Will maintain current immunosuppression. Continue to monitor.    OI Prophylaxis:   CMV Status: D+/ R+, moderate risk . CMV prophylaxis: valganciclovir 450 mg daily x 3 months per protocol.  Lab Results   Component Value Date    CMV Quant <50 (H) 07/22/2019    CMV Quant <50 (H) 07/01/2019    CMV Quant <50 (H) 06/24/2019    CMV Quant <50 (H) 06/17/2019     PCP Prophylaxis: bactrim SS 1 tab MWF -> dapsone 2/2 hyperkalemia x 6 months.  K pneumo UTI 6/29: completed 7d of cephalexin  Thrush: completed in hospital   Donor positive BCx s. [place]ominis: dapto inpatient - linezolid 600 mg BID outpt thru 7/3 complete  Patient is  tolerating infectious prophylaxis well    Plan: Continue per protocol. Continue to monitor.    CV Prophylaxis: asa 81 mg   The 10-year ASCVD risk score Denman George DC Jr., et al., 2013) is: 8.3%  Statin therapy: Indicated; currently on no statin  Plan: consider starting statin at a later date. Continue to monitor     BP/history of paroxysmal AF: Goal < 140/90. Clinic vitals reported above. CHADS2-VASc score = 0  Home BP ranges:100-110s/60s  Current meds include: metoprolol 25 mg BID  Plan: out of goal. Decrease metoprolol to 12.5 mg BID.  Continue to monitor    Anemia of CKD:  H/H:   Lab Results   Component Value Date    HGB 11.5 (L) 08/05/2019     Lab Results   Component Value Date    HCT 36.4 (L) 08/05/2019     Iron panel:  No results found for: IRON, TIBC, FERRITIN  No results found for: LABIRON    Prior ESA use: none post transplant    Plan: within goal. Continue to monitor.     DM:   Lab Results   Component Value Date    A1C 5.0 04/02/2019   . Goal A1c < 7  History of Dm? No  Established with endocrinologist/PCP for BG managment? No  Currently on: no medications  Home BS log:not checking but FBG on labs have been in 110s  Diet: did not address specifics but patient reports to eating well  Exercise:not yet  Fluid intake: ~2.5L daily  Plan:  Continue to monitor    Electrolytes: calcium 11.1  Meds currently on: Sensipar 180 mg daily, mg plus protein 399 mg BID   Plan:  Waiting to see Endo. Continue to monitor     GI/BM: pt reports no diarrhea or constipation, no heartburn with PPI  Meds currently on: none  Plan: Continue to monitor    Pain: pt reports no pain  Meds currently on: APAP PRN, [place] to back  Plan: Continue to monitor    Bone health:   Vitamin D Level: 37.7 in July 2020. Goal > 30.   Last DEXA results:  none available  Current meds include: Sensipar 180 mg daily  Plan: Vitamin D level  within goal. Continue to monitor.     Women's/Men's Health:  Louis Dennis is a 58 y.o. male. Patient reports no men's/women's health issues  Plan: Continue to monitor  Gout - no recent flares  Medications currently on: allopurinol 100 mg daily  Plan: continue to monitor    Hair thinning  Meds currently on: biotin 5 mg daily  Plan: Continue to monitor    Adherence: Patient has good understanding of medications; was able to independently identify names/doses of immunosuppressants and OI meds.  Patient  does not fill their own pill box on a regular basis at home.  His wife manages his medications   Patient brought medication card:yes  Pill box:was correct   Plan: Encouraged patient to learn more about medications; provided extensive adherence counseling/intervention    Spent approximately 20 minutes on educating this patient and greater than 50% was spent in direct face to face counseling regarding post transplant medication education. Questions and concerns were address to patient's satisfaction.    Patient was reviewed with Dr. Don Broach, AGNP who was agreement with the stated plan:     During this visit, the following was completed:   BP log data assessment  Labs ordered and evaluated  complex treatment plan >1 DS   Patient education was completed for 11-24 minutes     All questions/concerns were addressed to the patient's satisfaction.  __________________________________________  Cleone Slim, PHARMD, CPP  [employer] [employer] TRANSPLANT CLINICAL PHARMACIST PRACTITIONER  PAGER [phone]

## 2019-08-06 NOTE — Unmapped (Signed)
Urine was collected and sent to the lab.

## 2019-08-06 NOTE — Unmapped (Signed)
Flu vaccine given as ordered by Provider. Tolerated well.

## 2019-08-08 ENCOUNTER — Ambulatory Visit: Admit: 2019-08-08 | Discharge: 2019-08-09 | Payer: MEDICARE

## 2019-08-08 DIAGNOSIS — Z940 Kidney transplant status: Secondary | ICD-10-CM

## 2019-08-08 DIAGNOSIS — D899 Disorder involving the immune mechanism, unspecified: Secondary | ICD-10-CM

## 2019-08-08 LAB — POTASSIUM: Potassium:SCnc:Pt:Ser/Plas:Qn:: 5.4 — ABNORMAL HIGH

## 2019-08-08 LAB — RENAL FUNCTION PANEL
ALBUMIN: 4.8 g/dL (ref 3.5–5.0)
ANION GAP: 7 mmol/L (ref 7–15)
BLOOD UREA NITROGEN: 27 mg/dL — ABNORMAL HIGH (ref 7–21)
BUN / CREAT RATIO: 34
CALCIUM: 10.8 mg/dL — ABNORMAL HIGH (ref 8.5–10.2)
CHLORIDE: 108 mmol/L — ABNORMAL HIGH (ref 98–107)
CREATININE: 0.79 mg/dL (ref 0.70–1.30)
EGFR CKD-EPI AA MALE: >90 mL/min/{1.73_m2} (ref >=60)
GLUCOSE RANDOM: 114 mg/dL (ref 70–179)
PHOSPHORUS: 2.2 mg/dL — ABNORMAL LOW (ref 2.9–4.7)
POTASSIUM: 5.4 mmol/L — ABNORMAL HIGH (ref 3.5–5.0)
SODIUM: 136 mmol/L (ref 135–145)

## 2019-08-08 LAB — CBC W/ AUTO DIFF
BASOPHILS ABSOLUTE COUNT: 0 10*9/L (ref 0.0–0.1)
BASOPHILS RELATIVE PERCENT: 0.8 %
EOSINOPHILS ABSOLUTE COUNT: 0.3 10*9/L (ref 0.0–0.4)
EOSINOPHILS RELATIVE PERCENT: 6.5 %
HEMATOCRIT: 35.4 % — ABNORMAL LOW (ref 41.0–53.0)
HEMOGLOBIN: 11 g/dL — ABNORMAL LOW (ref 13.5–17.5)
LARGE UNSTAINED CELLS: 1 % (ref 0–4)
LYMPHOCYTES ABSOLUTE COUNT: 0.1 10*9/L — ABNORMAL LOW (ref 1.5–5.0)
MEAN CORPUSCULAR HEMOGLOBIN CONC: 31 g/dL (ref 31.0–37.0)
MEAN CORPUSCULAR HEMOGLOBIN: 32.1 pg (ref 26.0–34.0)
MEAN CORPUSCULAR VOLUME: 103.8 fL — ABNORMAL HIGH (ref 80.0–100.0)
MEAN PLATELET VOLUME: 7.3 fL (ref 7.0–10.0)
MONOCYTES ABSOLUTE COUNT: 0.2 10*9/L (ref 0.2–0.8)
MONOCYTES RELATIVE PERCENT: 4.1 %
NEUTROPHILS ABSOLUTE COUNT: 3.6 10*9/L (ref 2.0–7.5)
NEUTROPHILS RELATIVE PERCENT: 85.2 %
PLATELET COUNT: 223 10*9/L (ref 150–440)
RED CELL DISTRIBUTION WIDTH: 16.1 % — ABNORMAL HIGH (ref 12.0–15.0)

## 2019-08-08 LAB — TACROLIMUS BLOOD: Lab: 7.6

## 2019-08-08 LAB — ANISOCYTOSIS

## 2019-08-08 LAB — MAGNESIUM: Magnesium:MCnc:Pt:Ser/Plas:Qn:: 1.4 — ABNORMAL LOW

## 2019-08-09 NOTE — Unmapped (Signed)
[facility] Endocrinology regarding referral submitted 07/17/2019. They are backed up with referrals and appt, pt was put on a list to call to schedule appt.

## 2019-08-10 LAB — CMV DNA, QUANTITATIVE, PCR: CMV VIRAL LD: NOT DETECTED

## 2019-08-10 LAB — CMV COMMENT: Lab: 0

## 2019-08-15 ENCOUNTER — Ambulatory Visit: Admit: 2019-08-15 | Discharge: 2019-08-16 | Payer: MEDICARE

## 2019-08-15 DIAGNOSIS — D899 Disorder involving the immune mechanism, unspecified: Secondary | ICD-10-CM

## 2019-08-15 DIAGNOSIS — Z940 Kidney transplant status: Secondary | ICD-10-CM

## 2019-08-15 LAB — CBC W/ AUTO DIFF
BASOPHILS ABSOLUTE COUNT: 0 10*9/L (ref 0.0–0.1)
EOSINOPHILS ABSOLUTE COUNT: 0.2 10*9/L (ref 0.0–0.4)
EOSINOPHILS RELATIVE PERCENT: 7.6 %
HEMATOCRIT: 35.7 % — ABNORMAL LOW (ref 41.0–53.0)
LARGE UNSTAINED CELLS: 1 % (ref 0–4)
LYMPHOCYTES ABSOLUTE COUNT: 0.1 10*9/L — ABNORMAL LOW (ref 1.5–5.0)
LYMPHOCYTES RELATIVE PERCENT: 3.7 %
MEAN CORPUSCULAR HEMOGLOBIN CONC: 31.5 g/dL (ref 31.0–37.0)
MEAN CORPUSCULAR HEMOGLOBIN: 32.4 pg (ref 26.0–34.0)
MEAN CORPUSCULAR VOLUME: 102.7 fL — ABNORMAL HIGH (ref 80.0–100.0)
MEAN PLATELET VOLUME: 7.6 fL (ref 7.0–10.0)
MONOCYTES ABSOLUTE COUNT: 0.2 10*9/L (ref 0.2–0.8)
MONOCYTES RELATIVE PERCENT: 6.8 %
NEUTROPHILS ABSOLUTE COUNT: 2.5 10*9/L (ref 2.0–7.5)
NEUTROPHILS RELATIVE PERCENT: 79.8 %
PLATELET COUNT: 275 10*9/L (ref 150–440)
RED BLOOD CELL COUNT: 3.47 10*12/L — ABNORMAL LOW (ref 4.50–5.90)
RED CELL DISTRIBUTION WIDTH: 16 % — ABNORMAL HIGH (ref 12.0–15.0)

## 2019-08-15 LAB — HEMATOCRIT: Hematocrit:VFr:Pt:Bld:Qn:: 35.7 — ABNORMAL LOW

## 2019-08-15 LAB — TACROLIMUS BLOOD: Lab: 8.1

## 2019-08-15 LAB — RENAL FUNCTION PANEL
ALBUMIN: 4.9 g/dL (ref 3.5–5.0)
ANION GAP: 9 mmol/L (ref 7–15)
BLOOD UREA NITROGEN: 22 mg/dL — ABNORMAL HIGH (ref 7–21)
BUN / CREAT RATIO: 30
CALCIUM: 11.1 mg/dL — ABNORMAL HIGH (ref 8.5–10.2)
CREATININE: 0.74 mg/dL (ref 0.70–1.30)
EGFR CKD-EPI AA MALE: >90 mL/min/{1.73_m2} (ref >=60)
EGFR CKD-EPI NON-AA MALE: >90 mL/min/{1.73_m2} (ref >=60)
GLUCOSE RANDOM: 117 mg/dL (ref 70–179)
PHOSPHORUS: 2.4 mg/dL — ABNORMAL LOW (ref 2.9–4.7)
POTASSIUM: 5.4 mmol/L — ABNORMAL HIGH (ref 3.5–5.0)
SODIUM: 141 mmol/L (ref 135–145)

## 2019-08-15 LAB — MAGNESIUM: Magnesium:MCnc:Pt:Ser/Plas:Qn:: 1.5 — ABNORMAL LOW

## 2019-08-15 LAB — POTASSIUM: Potassium:SCnc:Pt:Ser/Plas:Qn:: 5.4 — ABNORMAL HIGH

## 2019-08-15 NOTE — Unmapped (Addendum)
[facility] Refill Coordination Note    Specialty Medication(s) to be Shipped:   Transplant: Myfortic 180mg , tacrolimus 1mg  and Valcyte 450mg     Other medication(s) to be shipped: allopurinol, dapsone and magnesium     Louis Dennis, DOB: [birth year]  Phone: [phone] (home)       All above HIPAA information was verified with patient.     Completed refill call assessment today to schedule patient's medication shipment from the [facility] [phone]).       Specialty medication(s) and dose(s) confirmed: Regimen is correct and unchanged.   Changes to medications: Louis Dennis reports no changes at this time.  Changes to insurance: No  Questions for the pharmacist: No    Confirmed patient received Welcome Packet with first shipment. The patient will receive a drug information handout for each medication shipped and additional FDA Medication Guides as required.       DISEASE/MEDICATION-SPECIFIC INFORMATION        N/A    SPECIALTY MEDICATION ADHERENCE     Medication Adherence    Patient reported X missed doses in the last month: 0  Specialty Medication: Tacrolimus 1 mg  Patient is on additional specialty medications: Yes  Additional Specialty Medications: Myfortic 180 mg  Patient Reported Additional Medication X Missed Doses in the Last Month: 0  Patient is on more than two specialty medications: Yes  Specialty Medication: Valcyte  Patient Reported Additional Medication X Missed Doses in the Last Month: 0  Any gaps in refill history greater than 2 weeks in the last 3 months: no  Demonstrates understanding of importance of adherence: yes  Informant: patient  Reliability of informant: reliable  Confirmed plan for next specialty medication refill: delivery by pharmacy  Refills needed for supportive medications: not needed                Myfortic 180 mg. 10 days on hand  Tacrolimus 1 mg. 10 days on hand  Valcyte 450 mg. 10 days on hand      SHIPPING     Shipping address confirmed in Epic. Delivery Scheduled: Yes, Expected medication delivery date: 091620.  However, Rx request for refills was sent to the provider as there are none remaining.     Medication will be delivered via Next Day Courier to the home address in Epic WAM.    Louis Dennis   [facility] Specialty Technician

## 2019-08-16 DIAGNOSIS — Z940 Kidney transplant status: Secondary | ICD-10-CM

## 2019-08-16 LAB — CMV DNA, QUANTITATIVE, PCR: CMV VIRAL LD: NOT DETECTED

## 2019-08-16 LAB — CMV VIRAL LD: Lab: NOT DETECTED

## 2019-08-16 NOTE — Unmapped (Signed)
Louis Dennis 's valganciclovir shipment will be delayed as a result of refill request denied. Refill not appropriate (Valcyte due to end 08/24/2019, only taking for 3 months post transplant) per [facility].    I have reached out to the patient and communicated the delivery change. We will not reschedule the medication due to no longer needed and have removed this/these medication(s) from the work request.

## 2019-08-19 ENCOUNTER — Ambulatory Visit: Admit: 2019-08-19 | Discharge: 2019-08-20 | Payer: MEDICARE

## 2019-08-19 DIAGNOSIS — D899 Disorder involving the immune mechanism, unspecified: Secondary | ICD-10-CM

## 2019-08-19 DIAGNOSIS — Z940 Kidney transplant status: Secondary | ICD-10-CM

## 2019-08-19 LAB — CBC W/ AUTO DIFF
BASOPHILS RELATIVE PERCENT: 0.6 %
EOSINOPHILS ABSOLUTE COUNT: 0.2 10*9/L (ref 0.0–0.4)
EOSINOPHILS RELATIVE PERCENT: 6.6 %
HEMATOCRIT: 33.7 % — ABNORMAL LOW (ref 41.0–53.0)
HEMOGLOBIN: 11 g/dL — ABNORMAL LOW (ref 13.5–17.5)
LYMPHOCYTES ABSOLUTE COUNT: 0.1 10*9/L — ABNORMAL LOW (ref 1.5–5.0)
LYMPHOCYTES RELATIVE PERCENT: 3.7 %
MEAN CORPUSCULAR HEMOGLOBIN CONC: 32.5 g/dL (ref 31.0–37.0)
MEAN CORPUSCULAR HEMOGLOBIN: 33 pg (ref 26.0–34.0)
MEAN CORPUSCULAR VOLUME: 101.3 fL — ABNORMAL HIGH (ref 80.0–100.0)
MEAN PLATELET VOLUME: 7.2 fL (ref 7.0–10.0)
MONOCYTES ABSOLUTE COUNT: 0.2 10*9/L (ref 0.2–0.8)
MONOCYTES RELATIVE PERCENT: 4.3 %
NEUTROPHILS ABSOLUTE COUNT: 2.9 10*9/L (ref 2.0–7.5)
NEUTROPHILS RELATIVE PERCENT: 83.9 %
PLATELET COUNT: 276 10*9/L (ref 150–440)
RED CELL DISTRIBUTION WIDTH: 15.8 % — ABNORMAL HIGH (ref 12.0–15.0)
WBC ADJUSTED: 3.5 10*9/L — ABNORMAL LOW (ref 4.5–11.0)

## 2019-08-19 LAB — RENAL FUNCTION PANEL
ALBUMIN: 4.8 g/dL (ref 3.5–5.0)
ANION GAP: 11 mmol/L (ref 7–15)
BLOOD UREA NITROGEN: 21 mg/dL (ref 7–21)
CALCIUM: 10.7 mg/dL — ABNORMAL HIGH (ref 8.5–10.2)
CHLORIDE: 106 mmol/L (ref 98–107)
CO2: 24 mmol/L (ref 22.0–30.0)
CREATININE: 0.76 mg/dL (ref 0.70–1.30)
EGFR CKD-EPI AA MALE: >90 mL/min/{1.73_m2} (ref >=60)
EGFR CKD-EPI NON-AA MALE: >90 mL/min/{1.73_m2} (ref >=60)
GLUCOSE RANDOM: 109 mg/dL (ref 70–179)
PHOSPHORUS: 2.5 mg/dL — ABNORMAL LOW (ref 2.9–4.7)
POTASSIUM: 5.5 mmol/L — ABNORMAL HIGH (ref 3.5–5.0)
SODIUM: 141 mmol/L (ref 135–145)

## 2019-08-19 LAB — POTASSIUM: Potassium:SCnc:Pt:Ser/Plas:Qn:: 5.5 — ABNORMAL HIGH

## 2019-08-19 LAB — MAGNESIUM: Magnesium:MCnc:Pt:Ser/Plas:Qn:: 1.4 — ABNORMAL LOW

## 2019-08-19 LAB — MEAN CORPUSCULAR VOLUME: Lab: 101.3 — ABNORMAL HIGH

## 2019-08-19 LAB — TACROLIMUS BLOOD: Lab: 7.9

## 2019-08-20 MED FILL — TACROLIMUS 1 MG CAPSULE: 30 days supply | Qty: 150 | Fill #2 | Status: AC

## 2019-08-20 MED FILL — MYFORTIC 180 MG TABLET,DELAYED RELEASE: 30 days supply | Qty: 180 | Fill #2 | Status: AC

## 2019-08-20 MED FILL — MG-PLUS-PROTEIN 133 MG TABLET: ORAL | 33 days supply | Qty: 200 | Fill #2

## 2019-08-20 MED FILL — MG-PLUS-PROTEIN 133 MG TABLET: 33 days supply | Qty: 200 | Fill #2 | Status: AC

## 2019-08-20 MED FILL — TACROLIMUS 1 MG CAPSULE, IMMEDIATE-RELEASE: 30 days supply | Qty: 150 | Fill #2

## 2019-08-20 MED FILL — MYFORTIC 180 MG TABLET,DELAYED RELEASE: ORAL | 30 days supply | Qty: 180 | Fill #2

## 2019-08-21 LAB — CMV DNA, QUANTITATIVE, PCR

## 2019-08-21 LAB — CMV QUANT LOG10: Lab: 0

## 2019-08-22 ENCOUNTER — Ambulatory Visit: Admit: 2019-08-22 | Discharge: 2019-08-23 | Payer: MEDICARE

## 2019-08-22 DIAGNOSIS — D899 Disorder involving the immune mechanism, unspecified: Secondary | ICD-10-CM

## 2019-08-22 DIAGNOSIS — Z940 Kidney transplant status: Secondary | ICD-10-CM

## 2019-08-22 LAB — CBC W/ AUTO DIFF
BASOPHILS ABSOLUTE COUNT: 0 10*9/L (ref 0.0–0.1)
BASOPHILS RELATIVE PERCENT: 0.7 %
EOSINOPHILS ABSOLUTE COUNT: 0.3 10*9/L (ref 0.0–0.4)
LARGE UNSTAINED CELLS: 1 % (ref 0–4)
LYMPHOCYTES ABSOLUTE COUNT: 0.2 10*9/L — ABNORMAL LOW (ref 1.5–5.0)
LYMPHOCYTES RELATIVE PERCENT: 3.3 %
MEAN CORPUSCULAR HEMOGLOBIN CONC: 32.1 g/dL (ref 31.0–37.0)
MEAN CORPUSCULAR VOLUME: 102.6 fL — ABNORMAL HIGH (ref 80.0–100.0)
MEAN PLATELET VOLUME: 7.5 fL (ref 7.0–10.0)
MONOCYTES ABSOLUTE COUNT: 0.2 10*9/L (ref 0.2–0.8)
MONOCYTES RELATIVE PERCENT: 5.3 %
NEUTROPHILS ABSOLUTE COUNT: 3.8 10*9/L (ref 2.0–7.5)
NEUTROPHILS RELATIVE PERCENT: 83.9 %
PLATELET COUNT: 266 10*9/L (ref 150–440)
RED BLOOD CELL COUNT: 3.27 10*12/L — ABNORMAL LOW (ref 4.50–5.90)
RED CELL DISTRIBUTION WIDTH: 16.2 % — ABNORMAL HIGH (ref 12.0–15.0)
WBC ADJUSTED: 4.5 10*9/L (ref 4.5–11.0)

## 2019-08-22 LAB — RENAL FUNCTION PANEL
ALBUMIN: 4.8 g/dL (ref 3.5–5.0)
ANION GAP: 10 mmol/L (ref 7–15)
BLOOD UREA NITROGEN: 27 mg/dL — ABNORMAL HIGH (ref 7–21)
CALCIUM: 10.8 mg/dL — ABNORMAL HIGH (ref 8.5–10.2)
CHLORIDE: 106 mmol/L (ref 98–107)
CO2: 25 mmol/L (ref 22.0–30.0)
CREATININE: 0.77 mg/dL (ref 0.70–1.30)
EGFR CKD-EPI AA MALE: >90 mL/min/{1.73_m2} (ref >=60)
EGFR CKD-EPI NON-AA MALE: >90 mL/min/{1.73_m2} (ref >=60)
GLUCOSE RANDOM: 118 mg/dL (ref 70–179)
PHOSPHORUS: 2.3 mg/dL — ABNORMAL LOW (ref 2.9–4.7)
POTASSIUM: 5.2 mmol/L — ABNORMAL HIGH (ref 3.5–5.0)
SODIUM: 141 mmol/L (ref 135–145)

## 2019-08-22 LAB — TACROLIMUS BLOOD: Lab: 8.6

## 2019-08-22 LAB — MAGNESIUM: Magnesium:MCnc:Pt:Ser/Plas:Qn:: 1.4 — ABNORMAL LOW

## 2019-08-22 LAB — WBC ADJUSTED: Leukocytes:NCnc:Pt:Bld:Qn:: 4.5

## 2019-08-22 LAB — ANION GAP: Anion gap 3:SCnc:Pt:Ser/Plas:Qn:: 10

## 2019-08-22 MED FILL — DAPSONE 100 MG TABLET: ORAL | 30 days supply | Qty: 30 | Fill #1

## 2019-08-22 MED FILL — DAPSONE 100 MG TABLET: 30 days supply | Qty: 30 | Fill #1 | Status: AC

## 2019-08-22 MED FILL — ALLOPURINOL 100 MG TABLET: ORAL | 30 days supply | Qty: 30 | Fill #1

## 2019-08-22 MED FILL — ALLOPURINOL 100 MG TABLET: 30 days supply | Qty: 30 | Fill #1 | Status: AC

## 2019-08-23 NOTE — Unmapped (Signed)
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    Diagnoses and all orders for this visit:    Renovascular hypertension  BP at goal (120/70 in clinic today). Nephrology transplant team is managing medication. Continue metoprolol 12.5 mg BID, as directed. Reviewed low sodium diet and encouraged regular exercise. Advised patient to continue to monitor and log at-home BP readings.    End stage renal disease (CMS-HCC)  S/p renal transplant. Continue to follow closely with transplant team. Specialist is following hypercalcemia.     Multinodular goiter  PTH (06/24/19) 242.6. Patient is scheduled for follow up with endocrinology in 10/2019.     Chronic pain of both knees  Associated to inactivity post-op. Muscular atrophy noted.   Recommend neoprene knee sleeve and muscle rub (BioFreeze/Tiger Balm/Capsacin) for relief. Advised patient to avoid diclofenac (Voltaren) and Aspercreme.    HPI:      Louis Dennis  is here for   Chief Complaint   Patient presents with   ??? renovascular hypertension     kidney transplant in June 2020     Hypertension: Patient presents for follow-up of hypertension. Blood pressure goal < 140/90.  Hypertension has customarily been at goal.  Home blood pressure readings: range 100-124/65-80. Salt intake and diet: salt not added to cooking and salt shaker not on table. Associated signs and symptoms: palpitations that go away after a few deep breaths, and lightheadedness with positional change. Patient denies: blurred vision, chest pain, dyspnea, headache, neck aches, orthopnea, paroxysmal nocturnal dyspnea, peripheral edema, pulsating in the ears and tiredness/fatigue. Medication compliance: taking as prescribed. He is not doing regular exercise. He was advised to avoid activity outdoors for 6 months post-op, but states he tries to be active in his home.     Patient had kidney transplant in 05/2019. He has continued following with transplant team every month and is completing labs bi-weekly. Transplant team is currently managing medications.     Per chart review, labs showed persistent hypercalcemia since transplantation despite progressive up-titration of Sensipar. PTH (06/24/19) 242.6. Patient was referred to endocrinology and has office visit scheduled for 10/2019.    Patient reports knee pain. He is taking Tylenol prn for relief.      [facility] Components:     Goals     ??? Exercise at least 3x per week (30 min per time)      Pt walks about 4-5 days per week            Medication adherence and barriers to the treatment plan have been addressed. Opportunities to optimize healthy behaviors have been discussed. Patient / caregiver voiced understanding.      Past Medical/Surgical History:     Past Medical History:   Diagnosis Date   ??? Abnormal thyroid function test    ??? End stage renal disease (CMS-HCC)     now on peritoneal dialysis   ??? Gout     reports was in Right hand, foot and left elbow   ??? Hypertension     takes meds intermitttently based on readings   ??? Nephrolithiasis 2014     Past Surgical History:   Procedure Laterality Date   ??? OTHER SURGICAL HISTORY Right 08/2017    Pt had catheter moved from left side to right side.   ??? pd catheter  2012   ??? PR COLONOSCOPY W/BIOPSY SINGLE/MULTIPLE N/A 06/15/2018    Procedure: COLONOSCOPY, FLEXIBLE, PROXIMAL TO SPLENIC FLEXURE; WITH BIOPSY, SINGLE OR MULTIPLE;  Surgeon: Charm Rings, MD;  Location: [facility];  Service: Gastroenterology   ??? PR COLSC FLX W/RMVL OF TUMOR POLYP LESION SNARE TQ N/A 06/15/2018    Procedure: COLONOSCOPY FLEX; W/REMOV TUMOR/LES BY SNARE;  Surgeon: Charm Rings, MD;  Location: [facility];  Service: Gastroenterology   ??? PR EXPLORATORY OF ABDOMEN N/A 11/22/2018    Procedure: EXPLORATORY LAPAROTOMY, EXPLORATORY CELIOTOMY WITH OR WITHOUT BIOPSY(S);  Surgeon: Lawrence Marseilles Day Thurnell Lose, MD;  Location: MAIN OR [facility];  Service: Trauma   ??? PR FREEING BOWEL ADHESION,ENTEROLYSIS N/A 11/22/2018    Procedure: Enterolysis (Separt Proc);  Surgeon: Lawrence Marseilles Day Thurnell Lose, MD;  Location: MAIN OR [facility];  Service: Trauma   ??? PR REMOVE PERITONEAL FOREIGN BODY N/A 11/22/2018    Procedure: Removal Of Peritoneal Of Foreign Body From Peritoneal Cavity;  Surgeon: Lawrence Marseilles Day Thurnell Lose, MD;  Location: MAIN OR [facility];  Service: Trauma   ??? PR TRANSPLANT,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    Procedure: [facility] STD PREP CAD DONR RENAL ALLOGFT PRIOR TO TRNSPLNT, INCL DISSEC/REM PERINEPH FAT, DIAPH/RTPER ATTAC;  Surgeon: Doyce Loose, MD;  Location: MAIN OR [facility];  Service: Transplant   ??? PR TRANSPLANTATION OF KIDNEY N/A 05/24/2019    Procedure: RENAL ALLOTRANSPLANTATION, IMPLANTATION OF GRAFT; WITHOUT RECIPIENT NEPHRECTOMY;  Surgeon: Doyce Loose, MD;  Location: MAIN OR [facility];  Service: Transplant       Family History:     Family History   Problem Relation Age of Onset   ??? Hypertension Mother    ??? Kidney disease Mother    ??? Diabetes Mother    ??? Hypertension Father    ??? Kidney disease Sister         s/p transplant   ??? Hypertension Brother    ??? Glaucoma Neg Hx    ??? Amblyopia Neg Hx    ??? Blindness Neg Hx    ??? Retinal detachment Neg Hx    ??? Strabismus Neg Hx    ??? Macular degeneration Neg Hx    ??? Basal cell carcinoma Neg Hx    ??? Melanoma Neg Hx    ??? Squamous cell carcinoma Neg Hx        Social History:     Social History     Socioeconomic History   ??? Marital status: Married     Spouse name: None   ??? Number of children: 2   ??? Years of education: 34   ??? Highest education level: None   Occupational History   ??? None   Social Needs   ??? Financial resource strain: Not hard at all   ??? Food insecurity     Worry: Sometimes true     Inability: Sometimes true   ??? Transportation needs     Medical: No     Non-medical: No   Tobacco Use   ??? Smoking status: Former Smoker     Packs/day: 1.00     Years: 30.00     Pack years: 30.00     Types: Cigarettes     Quit date: 07/10/2009     Years since quitting: 10.1   ??? Smokeless tobacco: Never Used   Substance and Sexual Activity   ??? Alcohol use: No     Alcohol/week: 0.0 standard drinks   ??? Drug use: No   ??? Sexual activity: None   Lifestyle   ??? Physical activity     Days per week: 7 days     Minutes per  session: 90 min   ??? Stress: Only a little   Relationships   ??? Social Wellsite geologist on phone: More than three times a week     Gets together: More than three times a week     Attends religious service: More than 4 times per year     Active member of club or organization: Yes     Attends meetings of clubs or organizations: More than 4 times per year     Relationship status: Married   Other Topics Concern   ??? Do you use sunscreen? Yes   ??? Tanning bed use? No   ??? Are you easily burned? No   ??? Excessive sun exposure? No   ??? Blistering sunburns? No   Social History Narrative   ??? None       Allergies:     Patient has no known allergies.    Current Medications:     Current Outpatient Medications   Medication Sig Dispense Refill   ??? acetaminophen (TYLENOL) 500 MG tablet Take 1-2 tablets (500-1,000 mg total) by mouth every six (6) hours as needed for pain. 100 tablet 0   ??? allopurinoL (ZYLOPRIM) 100 MG tablet Take 1 tablet (100 mg total) by mouth daily. 30 tablet 11   ??? aspirin (ECOTRIN) 81 MG tablet Take 1 tablet (81 mg total) by mouth daily. 30 tablet 11   ??? biotin 5 mg cap Take 1 capsule (5,000 mcg total) by mouth daily.  0   ??? cinacalcet (SENSIPAR) 60 MG tablet Take 3 tablets (180 mg total) by mouth daily. 90 tablet 11   ??? dapsone 100 MG tablet Take 1 tablet (100 mg total) by mouth daily. 30 tablet 4   ??? magnesium oxide-Mg AA chelate (MAGNESIUM, AMINO ACID CHELATE,) 133 mg Tab Take 3 tablets by mouth Two (2) times a day. 200 tablet 5   ??? metoprolol tartrate (LOPRESSOR) 25 MG tablet Take 0.5 tablets (12.5 mg total) by mouth Two (2) times a day. 30 tablet 11   ??? MYFORTIC 180 mg EC tablet Take 3 tablets (540 mg total) by mouth Two (2) times a day. 180 tablet 11 ??? tacrolimus (PROGRAF) 1 MG capsule Take 3 capsules (3 mg) by mouth in the morning and 2 capsules (2 mg) at night 150 capsule 11     No current facility-administered medications for this visit.        Health Maintenance:     Health Maintenance Summary w/Most Recent Date       Status Date      DTaP/Tdap/Td Vaccines Overdue 06/20/1979     Zoster Vaccines Overdue 06/19/2010     Lipid Screening Next Due 02/29/2024      Done 03/01/2019 LIPID PANEL     Done 03/01/2019 [facility]: [facility] TOTAL CHOLESTEROL AND HDL COMPLETE     Done 01/25/2018 LIPID PANEL     Done 03/15/2016 LIPID PANEL     Done 07/31/2015 LIPID PANEL     Patient has more history with this topic...    Colonoscopy Next Due 06/15/2028      Done 06/15/2018 COLONOSCOPY     Done 06/15/2018 Surg:PR COLONOSCOPY W/BIOPSY SINGLE/MULTIPLE     Done 06/15/2018 Surg:PR COLSC FLX W/RMVL OF TUMOR POLYP LESION SNARE TQ     Done 02/26/2013 COLONOSCOPY (HISTORICAL RESULT)    Hepatitis C Screen This plan is no longer active.      Done 05/23/2019 HEPATITIS C ANTIBODY Hepatitis C Ab  Done 04/02/2019 HEPATITIS C ANTIBODY Hepatitis C Ab           Done 03/01/2019 HEPATITIS C ANTIBODY Hepatitis C Ab           Done 11/26/2018 HEPATITIS C ANTIBODY Hepatitis C Ab           Done 01/25/2018 HEPATITIS C ANTIBODY Hepatitis C Ab           Patient has more history with this topic...    Influenza Vaccine This plan is no longer active.      Done 08/06/2019 Imm Admin: Influenza Vaccine Quad (IIV4 PF) 16mo+ injectable     Done 09/04/2018 Imm Admin: Influenza Virus Vaccine, unspecified formulation     Done 09/11/2017 Imm Admin: Influenza Vaccine Quad (IIV4 PF) 62mo+ injectable     Done 09/04/2017 Imm Admin: INFLUENZA INJ MDCK PF, Quad (Flucelvax)(4y and up)     Done 09/06/2016 Imm Admin: Influenza Vaccine Quad (IIV4 PF) 53mo+ injectable     Patient has more history with this topic...          Immunizations:     Immunization History   Administered Date(s) Administered   ??? INFLUENZA INJ MDCK PF, Quad (Flucelvax)(4y and up Egg Free) 09/04/2017   ??? Influenza Vaccine Quad (IIV4 PF) 60mo+ injectable 08/16/2015, 09/06/2016, 09/11/2017, 08/06/2019   ??? Influenza Virus Vaccine, unspecified formulation 09/04/2018   ??? PNEUMOCOCCAL POLYSACCHARIDE 23 07/06/2015       I have reviewed and (if needed) updated the patient's problem list, medications, allergies, past medical and surgical history, social and family history.    ROS:      ROS  Comprehensive 10 point ROS negative unless otherwise stated in the HPI.       Vital Signs:     Wt Readings from Last 3 Encounters:   08/27/19 64 kg (141 lb)   08/06/19 64 kg (141 lb)   08/06/19 64 kg (141 lb 1.6 oz)     Temp Readings from Last 3 Encounters:   08/27/19 36.9 ??C (98.5 ??F) (Temporal)   08/06/19 36.4 ??C (97.5 ??F) (Tympanic)   08/06/19 36.4 ??C (97.5 ??F) (Tympanic)     BP Readings from Last 3 Encounters:   08/27/19 120/70   08/06/19 101/66   08/06/19 101/66     Pulse Readings from Last 3 Encounters:   08/27/19 76   08/06/19 72   08/06/19 72     Estimated body mass index is 24.98 kg/m?? as calculated from the following:    Height as of 08/06/19: 160 cm (5' 3).    Weight as of this encounter: 64 kg (141 lb).  Facility age limit for growth percentiles is 20 years.    Objective:      General: Alert and oriented x3. Well-appearing. No acute distress.   HEENT:  Normocephalic.  Atraumatic. Conjunctiva and sclera normal. OP MMM without lesions.   Neck:  Supple. No thyroid enlargement. No adenopathy.   Heart:  Regular rate and rhythm . Normal S1, S2.  No murmurs, rubs or gallops.   Lungs:  No respiratory distress.  Lungs clear to auscultation. No wheezes, rhonchi, or rales.   GI/GU:  Soft, +BS, nondistended, non-TTP. No palpable masses or organomegaly.   Extremities:  No edema. Peripheral pulses normal.   Skin:  Warm, dry. No rash or lesions present.   Neuro:  Non-focal. No obvious weakness.   Psych:  Affect normal, eye contact good, speech clear and coherent.       HPI obtained and  examination performed by Brooke Bonito, Nurse Practitioner Student. I was present during the visit, participating in the key components of the service and supervising the time spent by the NP student in work on the day of the clinic visit.    Noralyn Pick, FNP    I attest that I, Bayard Hugger, personally documented this note while acting as scribe for Noralyn Pick, FNP.      Bayard Hugger, Scribe.  08/27/2019     The documentation recorded by the scribe accurately reflects the service I personally performed and the decisions made by me.    Noralyn Pick, FNP

## 2019-08-24 LAB — CMV DNA, QUANTITATIVE, PCR: CMV VIRAL LD: NOT DETECTED

## 2019-08-24 LAB — CMV VIRAL LD: Lab: NOT DETECTED

## 2019-08-26 ENCOUNTER — Ambulatory Visit: Admit: 2019-08-26 | Discharge: 2019-08-27 | Payer: MEDICARE

## 2019-08-26 DIAGNOSIS — D899 Disorder involving the immune mechanism, unspecified: Secondary | ICD-10-CM

## 2019-08-26 DIAGNOSIS — Z940 Kidney transplant status: Secondary | ICD-10-CM

## 2019-08-26 LAB — TACROLIMUS BLOOD: Lab: 7

## 2019-08-26 LAB — RENAL FUNCTION PANEL
ALBUMIN: 4.9 g/dL (ref 3.5–5.0)
ANION GAP: 10 mmol/L (ref 7–15)
BLOOD UREA NITROGEN: 22 mg/dL — ABNORMAL HIGH (ref 7–21)
CALCIUM: 11.3 mg/dL — ABNORMAL HIGH (ref 8.5–10.2)
CHLORIDE: 107 mmol/L (ref 98–107)
CO2: 25 mmol/L (ref 22.0–30.0)
CREATININE: 0.73 mg/dL (ref 0.70–1.30)
EGFR CKD-EPI AA MALE: >90 mL/min/{1.73_m2} (ref >=60)
EGFR CKD-EPI NON-AA MALE: >90 mL/min/{1.73_m2} (ref >=60)
GLUCOSE RANDOM: 113 mg/dL — ABNORMAL HIGH (ref 70–99)
PHOSPHORUS: 2.2 mg/dL — ABNORMAL LOW (ref 2.9–4.7)
POTASSIUM: 4.8 mmol/L (ref 3.5–5.0)
SODIUM: 142 mmol/L (ref 135–145)

## 2019-08-26 LAB — CBC W/ AUTO DIFF
BASOPHILS ABSOLUTE COUNT: 0 10*9/L (ref 0.0–0.1)
BASOPHILS RELATIVE PERCENT: 0.9 %
EOSINOPHILS ABSOLUTE COUNT: 0.3 10*9/L (ref 0.0–0.4)
EOSINOPHILS RELATIVE PERCENT: 7 %
HEMATOCRIT: 33 % — ABNORMAL LOW (ref 41.0–53.0)
HEMOGLOBIN: 10.7 g/dL — ABNORMAL LOW (ref 13.5–17.5)
LYMPHOCYTES ABSOLUTE COUNT: 0.1 10*9/L — ABNORMAL LOW (ref 1.5–5.0)
LYMPHOCYTES RELATIVE PERCENT: 3.5 %
MEAN CORPUSCULAR HEMOGLOBIN CONC: 32.3 g/dL (ref 31.0–37.0)
MEAN CORPUSCULAR VOLUME: 102.7 fL — ABNORMAL HIGH (ref 80.0–100.0)
MEAN PLATELET VOLUME: 7.7 fL (ref 7.0–10.0)
MONOCYTES ABSOLUTE COUNT: 0.3 10*9/L (ref 0.2–0.8)
MONOCYTES RELATIVE PERCENT: 6.5 %
NEUTROPHILS ABSOLUTE COUNT: 3.2 10*9/L (ref 2.0–7.5)
NEUTROPHILS RELATIVE PERCENT: 80.4 %
PLATELET COUNT: 309 10*9/L (ref 150–440)
RED BLOOD CELL COUNT: 3.21 10*12/L — ABNORMAL LOW (ref 4.50–5.90)
RED CELL DISTRIBUTION WIDTH: 15.7 % — ABNORMAL HIGH (ref 12.0–15.0)

## 2019-08-26 LAB — RED CELL DISTRIBUTION WIDTH: Lab: 15.7 — ABNORMAL HIGH

## 2019-08-26 LAB — PHOSPHORUS: Phosphate:MCnc:Pt:Ser/Plas:Qn:: 2.2 — ABNORMAL LOW

## 2019-08-26 LAB — MAGNESIUM: Magnesium:MCnc:Pt:Ser/Plas:Qn:: 1.4 — ABNORMAL LOW

## 2019-08-27 ENCOUNTER — Ambulatory Visit: Admit: 2019-08-27 | Discharge: 2019-08-28 | Payer: MEDICARE | Attending: Family | Primary: Family

## 2019-08-27 DIAGNOSIS — I150 Renovascular hypertension: Secondary | ICD-10-CM

## 2019-08-27 DIAGNOSIS — N186 End stage renal disease: Secondary | ICD-10-CM

## 2019-08-27 DIAGNOSIS — M25562 Pain in left knee: Secondary | ICD-10-CM

## 2019-08-27 DIAGNOSIS — G8929 Other chronic pain: Secondary | ICD-10-CM

## 2019-08-27 DIAGNOSIS — M25561 Pain in right knee: Secondary | ICD-10-CM

## 2019-08-27 DIAGNOSIS — E042 Nontoxic multinodular goiter: Secondary | ICD-10-CM

## 2019-08-27 NOTE — Unmapped (Signed)
Biofreeze   Tiger Balm   Capsacin     AVOID diclofenac (Voltaren) and Aspercreme    Can also try neoprene sleeve over the knee to help support it and relieve pain.

## 2019-08-29 ENCOUNTER — Ambulatory Visit: Admit: 2019-08-29 | Discharge: 2019-08-30 | Payer: MEDICARE

## 2019-08-29 DIAGNOSIS — Z940 Kidney transplant status: Secondary | ICD-10-CM

## 2019-08-29 DIAGNOSIS — D899 Disorder involving the immune mechanism, unspecified: Secondary | ICD-10-CM

## 2019-08-29 LAB — CBC W/ AUTO DIFF
BASOPHILS ABSOLUTE COUNT: 0 10*9/L (ref 0.0–0.1)
BASOPHILS RELATIVE PERCENT: 0.7 %
EOSINOPHILS ABSOLUTE COUNT: 0.3 10*9/L (ref 0.0–0.4)
HEMATOCRIT: 32.8 % — ABNORMAL LOW (ref 41.0–53.0)
HEMOGLOBIN: 10.5 g/dL — ABNORMAL LOW (ref 13.5–17.5)
LARGE UNSTAINED CELLS: 1 % (ref 0–4)
LYMPHOCYTES ABSOLUTE COUNT: 0.2 10*9/L — ABNORMAL LOW (ref 1.5–5.0)
LYMPHOCYTES RELATIVE PERCENT: 3.9 %
MEAN CORPUSCULAR HEMOGLOBIN CONC: 32 g/dL (ref 31.0–37.0)
MEAN CORPUSCULAR HEMOGLOBIN: 33.2 pg (ref 26.0–34.0)
MEAN CORPUSCULAR VOLUME: 103.7 fL — ABNORMAL HIGH (ref 80.0–100.0)
MEAN PLATELET VOLUME: 7.5 fL (ref 7.0–10.0)
MONOCYTES ABSOLUTE COUNT: 0.3 10*9/L (ref 0.2–0.8)
MONOCYTES RELATIVE PERCENT: 6.5 %
NEUTROPHILS ABSOLUTE COUNT: 3.7 10*9/L (ref 2.0–7.5)
NEUTROPHILS RELATIVE PERCENT: 82 %
PLATELET COUNT: 278 10*9/L (ref 150–440)
RED CELL DISTRIBUTION WIDTH: 15.9 % — ABNORMAL HIGH (ref 12.0–15.0)
WBC ADJUSTED: 4.5 10*9/L (ref 4.5–11.0)

## 2019-08-29 LAB — RENAL FUNCTION PANEL
ALBUMIN: 4.6 g/dL (ref 3.5–5.0)
ANION GAP: 14 mmol/L (ref 7–15)
BLOOD UREA NITROGEN: 19 mg/dL (ref 7–21)
BUN / CREAT RATIO: 29
CALCIUM: 11 mg/dL — ABNORMAL HIGH (ref 8.5–10.2)
CHLORIDE: 104 mmol/L (ref 98–107)
CREATININE: 0.65 mg/dL — ABNORMAL LOW (ref 0.70–1.30)
EGFR CKD-EPI AA MALE: >90 mL/min/{1.73_m2} (ref >=60)
EGFR CKD-EPI NON-AA MALE: >90 mL/min/{1.73_m2} (ref >=60)
GLUCOSE RANDOM: 130 mg/dL (ref 70–179)
PHOSPHORUS: 2.4 mg/dL — ABNORMAL LOW (ref 2.9–4.7)
POTASSIUM: 4.8 mmol/L (ref 3.5–5.0)
SODIUM: 141 mmol/L (ref 135–145)

## 2019-08-29 LAB — CMV VIRAL LD: Lab: NOT DETECTED

## 2019-08-29 LAB — CMV DNA, QUANTITATIVE, PCR

## 2019-08-29 LAB — WBC ADJUSTED: Leukocytes:NCnc:Pt:Bld:Qn:: 4.5

## 2019-08-29 LAB — ANION GAP: Anion gap 3:SCnc:Pt:Ser/Plas:Qn:: 14

## 2019-08-29 LAB — TACROLIMUS BLOOD: Lab: 9.6

## 2019-08-29 LAB — MAGNESIUM: Magnesium:MCnc:Pt:Ser/Plas:Qn:: 1.4 — ABNORMAL LOW

## 2019-08-30 LAB — CMV VIRAL LD: Lab: DETECTED — AB

## 2019-08-30 LAB — CMV DNA, QUANTITATIVE, PCR

## 2019-09-02 ENCOUNTER — Ambulatory Visit: Admit: 2019-09-02 | Discharge: 2019-09-03 | Payer: MEDICARE

## 2019-09-02 DIAGNOSIS — Z940 Kidney transplant status: Secondary | ICD-10-CM

## 2019-09-02 DIAGNOSIS — Z79899 Other long term (current) drug therapy: Secondary | ICD-10-CM

## 2019-09-02 DIAGNOSIS — D899 Disorder involving the immune mechanism, unspecified: Secondary | ICD-10-CM

## 2019-09-02 LAB — ANION GAP: Anion gap 3:SCnc:Pt:Ser/Plas:Qn:: 12

## 2019-09-02 LAB — RENAL FUNCTION PANEL
ALBUMIN: 4.7 g/dL (ref 3.5–5.0)
ANION GAP: 12 mmol/L (ref 7–15)
BLOOD UREA NITROGEN: 21 mg/dL (ref 7–21)
BUN / CREAT RATIO: 28
CALCIUM: 11.1 mg/dL — ABNORMAL HIGH (ref 8.5–10.2)
CHLORIDE: 104 mmol/L (ref 98–107)
CO2: 26 mmol/L (ref 22.0–30.0)
CREATININE: 0.75 mg/dL (ref 0.70–1.30)
EGFR CKD-EPI NON-AA MALE: >90 mL/min/{1.73_m2} (ref >=60)
GLUCOSE RANDOM: 123 mg/dL (ref 70–179)
PHOSPHORUS: 2.5 mg/dL — ABNORMAL LOW (ref 2.9–4.7)
POTASSIUM: 5.2 mmol/L — ABNORMAL HIGH (ref 3.5–5.0)
SODIUM: 142 mmol/L (ref 135–145)

## 2019-09-02 LAB — CBC W/ AUTO DIFF
BASOPHILS ABSOLUTE COUNT: 0 10*9/L (ref 0.0–0.1)
EOSINOPHILS ABSOLUTE COUNT: 0.2 10*9/L (ref 0.0–0.4)
EOSINOPHILS RELATIVE PERCENT: 5.4 %
HEMATOCRIT: 32.6 % — ABNORMAL LOW (ref 41.0–53.0)
HEMOGLOBIN: 10.7 g/dL — ABNORMAL LOW (ref 13.5–17.5)
LYMPHOCYTES ABSOLUTE COUNT: 0.2 10*9/L — ABNORMAL LOW (ref 1.5–5.0)
LYMPHOCYTES RELATIVE PERCENT: 4.7 %
MEAN CORPUSCULAR HEMOGLOBIN CONC: 32.7 g/dL (ref 31.0–37.0)
MEAN CORPUSCULAR HEMOGLOBIN: 33.8 pg (ref 26.0–34.0)
MEAN CORPUSCULAR VOLUME: 103.5 fL — ABNORMAL HIGH (ref 80.0–100.0)
MONOCYTES ABSOLUTE COUNT: 0.4 10*9/L (ref 0.2–0.8)
MONOCYTES RELATIVE PERCENT: 10.4 %
NEUTROPHILS ABSOLUTE COUNT: 2.6 10*9/L (ref 2.0–7.5)
NEUTROPHILS RELATIVE PERCENT: 76.2 %
PLATELET COUNT: 276 10*9/L (ref 150–440)
RED BLOOD CELL COUNT: 3.15 10*12/L — ABNORMAL LOW (ref 4.50–5.90)
RED CELL DISTRIBUTION WIDTH: 15.7 % — ABNORMAL HIGH (ref 12.0–15.0)
WBC ADJUSTED: 3.4 10*9/L — ABNORMAL LOW (ref 4.5–11.0)

## 2019-09-02 LAB — HEPATIC FUNCTION PANEL
ALBUMIN: 4.5 g/dL (ref 3.5–5.0)
ALKALINE PHOSPHATASE: 187 U/L — ABNORMAL HIGH (ref 38–126)
BILIRUBIN DIRECT: 0.3 mg/dL (ref 0.00–0.40)
BILIRUBIN TOTAL: 1 mg/dL (ref 0.0–1.2)
PROTEIN TOTAL: 6.8 g/dL (ref 6.5–8.3)

## 2019-09-02 LAB — LIPID PANEL
CHOLESTEROL/HDL RATIO SCREEN: 4
CHOLESTEROL: 125 mg/dL (ref 100–199)
LDL CHOLESTEROL CALCULATED: 58 mg/dL
NON-HDL CHOLESTEROL: 94 mg/dL
TRIGLYCERIDES: 182 mg/dL — ABNORMAL HIGH (ref 1–149)

## 2019-09-02 LAB — LARGE UNSTAINED CELLS: Lab: 2

## 2019-09-02 LAB — LDL CHOLESTEROL CALCULATED: Cholesterol.in LDL:MCnc:Pt:Ser/Plas:Qn:Calculated: 58

## 2019-09-02 LAB — MAGNESIUM: Magnesium:MCnc:Pt:Ser/Plas:Qn:: 1.5 — ABNORMAL LOW

## 2019-09-02 LAB — HEMOGLOBIN A1C: Hemoglobin A1c/Hemoglobin.total:MFr:Pt:Bld:Qn:: <4 — ABNORMAL LOW

## 2019-09-02 LAB — PROTEIN TOTAL: Protein:MCnc:Pt:Ser/Plas:Qn:: 6.8

## 2019-09-02 LAB — TACROLIMUS BLOOD: Lab: 7.6

## 2019-09-03 ENCOUNTER — Ambulatory Visit: Admit: 2019-09-03 | Discharge: 2019-09-03 | Payer: MEDICARE

## 2019-09-03 DIAGNOSIS — N186 End stage renal disease: Secondary | ICD-10-CM

## 2019-09-03 DIAGNOSIS — I480 Paroxysmal atrial fibrillation: Secondary | ICD-10-CM

## 2019-09-03 DIAGNOSIS — I120 Hypertensive chronic kidney disease with stage 5 chronic kidney disease or end stage renal disease: Secondary | ICD-10-CM

## 2019-09-03 DIAGNOSIS — Z79899 Other long term (current) drug therapy: Secondary | ICD-10-CM

## 2019-09-03 DIAGNOSIS — Z940 Kidney transplant status: Secondary | ICD-10-CM

## 2019-09-03 DIAGNOSIS — Z992 Dependence on renal dialysis: Secondary | ICD-10-CM

## 2019-09-03 DIAGNOSIS — D899 Disorder involving the immune mechanism, unspecified: Secondary | ICD-10-CM

## 2019-09-03 DIAGNOSIS — E041 Nontoxic single thyroid nodule: Secondary | ICD-10-CM

## 2019-09-03 DIAGNOSIS — Z7982 Long term (current) use of aspirin: Secondary | ICD-10-CM

## 2019-09-03 DIAGNOSIS — M5416 Radiculopathy, lumbar region: Secondary | ICD-10-CM

## 2019-09-03 LAB — URINALYSIS
BACTERIA: NONE SEEN /HPF
BILIRUBIN UA: NEGATIVE
BLOOD UA: NEGATIVE
GLUCOSE UA: NEGATIVE
KETONES UA: NEGATIVE
LEUKOCYTE ESTERASE UA: NEGATIVE
NITRITE UA: NEGATIVE
PROTEIN UA: NEGATIVE
RBC UA: <1 /HPF (ref <=3)
SQUAMOUS EPITHELIAL: <1 /HPF (ref 0–5)
UROBILINOGEN UA: 0.2
WBC UA: <1 /HPF (ref <=2)

## 2019-09-03 LAB — PROTEIN / CREATININE RATIO, URINE

## 2019-09-03 LAB — PROTEIN/CREAT RATIO, URINE: Protein/Creatinine:MRto:Pt:Urine:Qn:: 0

## 2019-09-03 LAB — COLOR

## 2019-09-03 MED ORDER — TACROLIMUS ER 1 MG TABLET,EXTENDED RELEASE 24 HR
ORAL_TABLET | Freq: Every day | ORAL | 11 refills | 30 days | Status: CP
Start: 2019-09-03 — End: 2020-09-02
  Filled 2019-09-05: qty 90, 30d supply, fill #0

## 2019-09-03 NOTE — Unmapped (Signed)
EKG performed and results given to the provider.

## 2019-09-03 NOTE — Unmapped (Signed)
Transplant Nephrology Clinic Visit    History of Present Illness  Louis Dennis is a 58 y.o. 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  -Adequate perfusion of the transplant kidney. Resistive indices within the mid main renal artery and at the anastomosis are slightly elevated. Attention on follow-up.  ??    Current Immunosuppression Regimen:   - Myfortic 540mg  BID  - Tacrolimus 3mg  qAM and 2mg  qPM      Subjective/Interval:   Since last visit patient stated that 15 days ago he started having some ringing in the ears that is more noticeable in the late afternoon/evening but is occurring daily.  He states he does not have any vertigo with head movement or changes in his hearing. Patient also states he is having what feels like heart palpitations/racing heart when laying down at night.  It lasts about 30 seconds and stops with deep breathing.  He checks his pulse during this time and he states it is always in the 70-80's.  He states this occurs about 2-3 times a week and about 1-2 hours after taking his tacrolimus.  Patient's BP at home have been running 100-128/60's with a heart rate of 70-80's. He continues to have some lightheadedness with position changes but not as frequent.     He was seen by his PCP on 08/27/19 and discussed his chronic knee pain.  Patient informed to try a neoprene knee sleeve and BioFreeze.  Patient advised to avoid Voltaren gel. Patient has an appointment with Endocrinology on 10/23/19.  He denies chest pain, shortness of breath, fever, chills, dysuria, hematuria, n/v/d, edema, and tremors.     Last dose of Prograf: 9:00pm     Review of Systems  Otherwise as per HPI, all other systems reviewed and are negative.    Medications  Current Outpatient Medications   Medication Sig Dispense Refill   ??? allopurinoL (ZYLOPRIM) 100 MG tablet Take 1 tablet (100 mg total) by mouth daily. 30 tablet 11   ??? aspirin (ECOTRIN) 81 MG tablet Take 1 tablet (81 mg total) by mouth daily. 30 tablet 11   ??? biotin 5 mg cap Take 1 capsule (5,000 mcg total) by mouth daily.  0   ??? cinacalcet (SENSIPAR) 60 MG tablet Take 3 tablets (180 mg total) by mouth daily. 90 tablet 11   ??? dapsone 100 MG tablet Take 1 tablet (100 mg total) by mouth daily. 30 tablet 4   ??? magnesium oxide-Mg AA chelate (MAGNESIUM, AMINO ACID CHELATE,) 133 mg Tab Take 3 tablets by mouth Two (2) times a day. 200 tablet 5   ??? metoprolol tartrate (LOPRESSOR) 25 MG tablet Take 0.5  tablets (12.5 mg total) by mouth Two (2) times a day. 30 tablet 11   ??? MYFORTIC 180 mg EC tablet Take 3 tablets (540 mg total) by mouth Two (2) times a day. 180 tablet 11   ??? tacrolimus (PROGRAF) 1 MG capsule Take 3 capsules (3 mg) by mouth in the morning and 2 capsules (2 mg) at night 150 capsule 11   ??? acetaminophen (TYLENOL) 500 MG tablet Take 1-2 tablets (500-1,000 mg total) by mouth every six (6) hours as needed for pain. 100 tablet 0   ??? tacrolimus (ENVARSUS XR) 1 mg Tb24 extended release tablet Take 3 mg by mouth daily. 90 tablet 11     No current facility-administered medications for this visit.            Physical Exam  BP 124/74  - Pulse 77  - Temp 36.7 ??C (98.1 ??F) (Tympanic)  - Ht 162.6 cm (5' 4)  - Wt 64.7 kg (142 lb 11.2 oz)  - BMI 24.49 kg/m??   General: Well appearing male, no acute distress  HEENT: wearing mask, EOMI, sclera anicteric; bilateral ears filled with ear wax and unable to see ear drum  Neck: Neck supple, no cervical lymphadenopathy appreciated. No overt palpable thyroid nodules. No tenderness with palaption.  CV: normal rate, normal rhythm, no murmur, no gallops, no rubs appreciated  Lungs: clear to auscultation bilaterally, normal work of breathing.  Abdomen: soft, non tender, RLQ surgical site healed.  Extremities: no edema  Musculoskeletal: no visible deformity, normal range of motion.  Pulses: intact distally throughout  Neurologic: awake, alert, and oriented x3    Laboratory Data and Imaging reviewed in EPIC    Assessment: [name] is a 58 y.o. 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     Recommendations/Plan:   Allograft Function: renal function stable at 0.75 with a baseline of approximately 0.7-0.9 since transplant. Will continue to monitor.     Immunosuppression Management [High Risk Medical Decision Making For Drug Therapy Requiring Intensive Monitoring For Toxicity]:  Tacrolimus trough 7.6.  Targeting tacrolimus trough level of approximately 8-10. Will switch patient to Envarsus due to ringing in ears and heart palpitations.  Continue Myfortic 540 mg BID    Blood Pressure Management: BP 124/74 at this visit. Continue Metoprolol 12.5 mg BID.     Infectious Prophylaxis and Monitoring:   Transitioned from Bactrim to Dapsone on 07/04/19 due to stable mild hyperkalemia.  1 decoy cell present on 08/06/19.  No decoys present today.  CMV VL <50 on 08/29/19, yesterday's pending.   The patient continues on Valcyte and Dapsone prophylaxis.     Hypercalcemia: Persistent elevated 12 - 12.5 since transplantation despite progressive up-titration of Sensipar. Reports compliance and no offending medications identified. PTH (06/24/19) 242.6.   Calcium level yesterday 11.1  - Continue Sensipar 180mg  daily  - Referred to Endocrinology - Appt 10/23/19  - L Thyroid nodule FNA (05/13/19) with benign thyroid epithelium and colloid  - Routine cancer screening up to date prior to transplant  - Will get a parathyroid ultrasound before endocrinology appt    Lower Back Pain with midline radiculopathy: Known underlying degenerative changes. Resolved per Patient.     Hypomagnesemia: Stable at 1.5, likely due to tarolimus. Continue maintenance oral supplementation    Lipid Management: Lipid panel drawn 03/01/19 showed LDL 80, HDL 36, TChol 151  Will continue to monitor  Atrial Fibrillation: EKG today showed NSR.  Patient saw his cardiologist on 05/21/19 (care everywhere).  Echo on 05/08/19 showed EF 50-55%.  Atrial fibrillation diagnosed 05/2019. At this time we believe the heart palpitations he is having are related to his Tacrolimus.     Ringing in Ears: Patient to use over the counter Debrox to remove ear wax. Differentials include increased ear wax production and side effect from tacrolimus. Switching patient to Envarsus as well.     Health Maintenance:   - Colonoscopy due 2029  - Vaccination: PCV-23 completed 07/06/2015. Due for PCV-13 after month 12.  -Flu shot: 08/06/19    Counseling:  I counseled the patient on the need to avoid sun exposure and the use of sunblock while outdoors given the relatively higher risk of skin malignancy in an immunosuppressed state and the need for adherence to immunosuppression medication.  Patient verbalized understanding.     Follow-Up:  Return to clinic in 4 weeks at [facility] with Dr. Nestor Lewandowsky  Will recheck ears at next visit.   Continue twice a week labs.  Patient will continue to follow-up with his primary care provider for non-transplant related issues and medication refills. We have ordered transplant specific labs per the center's guidelines to monitor and assess for toxicities from immunosuppressant drug therapy    Patient seen and evaluated with Dr. Nestor Lewandowsky who is in agreement with the plan.      Zacharias Ridling L. Marina Goodell, MSN, APRN, AGNP-C - Kidney Transplant Nurse Practitioner  [facility]

## 2019-09-03 NOTE — Unmapped (Signed)
Per Dr. Nestor Lewandowsky, pt needs Parathyroid [place]. Order in [place].

## 2019-09-03 NOTE — Unmapped (Signed)
Urine was collected and sent to the lab.

## 2019-09-03 NOTE — Unmapped (Signed)
Transplant Coordinator, Clinic Visit   Pt c/o ringing/noise in B ears, that started 5 days ago. Pt states nothing makes it better or worse.   Assessment  BP: 124/74, BP at home 103/65-128/70, occasional lightheadedness   Headache: no  Hand tremors: no  Numbness/tingling: no  Fevers: no  Chills/sweats: no  Shortness of breath: no  Chest pain or pressure: no  Palpitations: pt states he has 30 second bouts of what feels like racing heart rate, states HR 75-80 bpm, takes several deep breaths and improves  Nausea/vomiting: no  Diarrhea/constipation: no  UTI symptoms: no  Swelling: no  Intake: 2255ml-2500ml  Output: adequate  Any new medications? no    Immunosuppressant last taken: 9pm    Immunization status: received flu vaccine at last visit    Per Dr. Nestor Lewandowsky: Pt needs Parathyroid [place] and switching from Prograf to Envarsus 3mg  daily.     I spent a total of 10 minutes with Louis Dennis reviewing medications and symptoms.

## 2019-09-04 MED FILL — SENSIPAR 60 MG TABLET: ORAL | 30 days supply | Qty: 90 | Fill #2

## 2019-09-04 MED FILL — ASPIRIN 81 MG TABLET,DELAYED RELEASE: 30 days supply | Qty: 30 | Fill #3 | Status: AC

## 2019-09-04 MED FILL — ASPIRIN 81 MG TABLET,DELAYED RELEASE: ORAL | 30 days supply | Qty: 30 | Fill #3

## 2019-09-04 MED FILL — SENSIPAR 60 MG TABLET: 30 days supply | Qty: 90 | Fill #2 | Status: AC

## 2019-09-04 NOTE — Unmapped (Signed)
Called pt using [employer] [phone]) to review med change from Prograf to Envarsus. Pt will start Envarsus 3mg  daily, delivery of medication will be on Friday, pt plans to start it on Saturday. Reviewed need to take Envarsus on empty stomach and wait 1 hr after taking to eat or drink. Also made pt aware Dr. Nestor Lewandowsky would like a Parathyroid [place]. Order in epic. Message sent to scheduler to schedule. Pt verbalized understanding. All questions answered.

## 2019-09-04 NOTE — Unmapped (Signed)
[facility]   Patient Onboarding/Medication Counseling    Louis Dennis is a 58 y.o. male with kidney transplant who I am counseling today on initiation of therapy.  I am speaking to the patient.with a [place] interpreter.    Verified patient's date of birth / HIPAA.    Specialty medication(s) to be sent: Transplant: Envarsus 1mg       Non-specialty medications/supplies to be sent: n/a      Medications not needed at this time: n/a         Envarsus (tacrolimus XR)    Medication & Administration     Dosage: take 3 tablets (3mg  total) by mouth daily     Administration:   ? Take in the morning on empty stomach ??? 1 hour before or 2 hours after breakfast  ? Take with drink of water  ? Do not crush, break, or chew tablets    Adherence/Missed dose instructions:  ? Take a missed dose as soon as you think about it.  ? If it has been more than 15 hours since missed dose, skip the missed dose and go back to your regular time  ? Report all missed doses to transplant coordinator    Goals of Therapy     ? To prevent organ rejection    Side Effects & Monitoring Parameters     ? Common side effects  ? Fatigue  ? Headache  ? Stuffy nose or sore throat  ? Nausea, vomiting, stomach pain, diarrhea, constipation  ? Heartburn  ? Back or joint pain  ? Increased risk of infection    ? The following side effects should be reported to the provider:  ? Allergic reaction  ? Kidney issues (change in quantity or urine passed, blood in urine, or weight gain)  ? High blood pressure (dizziness, change in eyesight, headache)  ? Electrolyte issues (change in mood, confusion, muscle pain, or weakness)  ? Abnormal breathing  ? Shakiness  ? Unexplained bleeding or bruising (gums bleeding, blood in urine, nosebleeds, any abnormal bleeding)  ? Signs of infection    ? Monitoring Parameters  ? Renal function  ? Liver function  ? Glucose levels  ? Blood pressure  ? Tacrolimus trough levels      Contraindications, Warnings, & Precautions     ? Black Box Warning: Infections - immunosuppressant agents increase the risk of infection that may lead to hospitalization or death  ? Black Box Warning: Malignancy - immunosuppressant agents may be associated with the development of malignancies that may lead to hospitalization or death.  Limit or avoid sun and ultraviolet light exposure, use appropriate sun protection  ? Myocardial hypertrophy -avoid use in patients with congenital long QT syndrome  ? Diabetes mellitus - the risk for new-onset diabetes and insulin-dependent post-transplant diabetes mellitus is increased with tacrolimus use after transplantation  ? GI perforation  ? Hyperkalemia  ? Hypertension  ? Nephrotoxicity  ? Neurotoxicity    Drug/Food Interactions     ? Medication list reviewed in Epic. no interactions noted that clinic is not already monitoring.   ? Due to amount and severity of drug interactions, report ALL medications starts, discontinuations, and changes to transplant coordinator prior to making the change  ? Avoid alcohol  ? Avoid grapefruit or grapefruit juice  ? Avoid live vaccines    Storage, Handling Precautions, & Disposal     ? Store at room temperature  ? Keep away from children and pets  Current Medications (including OTC/herbals), Comorbidities and Allergies     Current Outpatient Medications   Medication Sig Dispense Refill   ??? acetaminophen (TYLENOL) 500 MG tablet Take 1-2 tablets (500-1,000 mg total) by mouth every six (6) hours as needed for pain. 100 tablet 0   ??? allopurinoL (ZYLOPRIM) 100 MG tablet Take 1 tablet (100 mg total) by mouth daily. 30 tablet 11   ??? aspirin (ECOTRIN) 81 MG tablet Take 1 tablet (81 mg total) by mouth daily. 30 tablet 11   ??? biotin 5 mg cap Take 1 capsule (5,000 mcg total) by mouth daily.  0   ??? cinacalcet (SENSIPAR) 60 MG tablet Take 3 tablets (180 mg total) by mouth daily. 90 tablet 11   ??? dapsone 100 MG tablet Take 1 tablet (100 mg total) by mouth daily. 30 tablet 4   ??? magnesium oxide-Mg AA chelate (MAGNESIUM, AMINO ACID CHELATE,) 133 mg Tab Take 3 tablets by mouth Two (2) times a day. 200 tablet 5   ??? metoprolol tartrate (LOPRESSOR) 25 MG tablet Take 0.5 tablets (12.5 mg total) by mouth Two (2) times a day. 30 tablet 11   ??? MYFORTIC 180 mg EC tablet Take 3 tablets (540 mg total) by mouth Two (2) times a day. 180 tablet 11   ??? tacrolimus (ENVARSUS XR) 1 mg Tb24 extended release tablet Take 3 mg by mouth daily. 90 tablet 11   ??? tacrolimus (PROGRAF) 1 MG capsule Take 3 capsules (3 mg) by mouth in the morning and 2 capsules (2 mg) at night 150 capsule 11     No current facility-administered medications for this visit.        No Known Allergies    Patient Active Problem List   Diagnosis   ??? Anemia in chronic kidney disease   ??? ESRD (end stage renal disease) (CMS-HCC)   ??? Gout   ??? Hypertensive renal disease with renal failure   ??? Secondary hyperparathyroidism, renal (CMS-HCC)   ??? End stage renal disease (CMS-HCC)   ??? Hypertension   ??? Abnormal thyroid function test   ??? Multinodular goiter   ??? Family history of diabetes mellitus (DM)   ??? Seborrheic dermatitis of scalp   ??? Follicular cyst of skin   ??? Paroxysmal atrial fibrillation (CMS-HCC)   ??? Encephalopathy acute   ??? SBO (small bowel obstruction) (CMS-HCC)   ??? Swelling of arm   ??? Immunocompromised state (CMS-HCC)   ??? Kidney replaced by transplant       Reviewed and up to date in Epic.    Appropriateness of Therapy     Is medication and dose appropriate based on diagnosis? Yes    Baseline Quality of Life Assessment      How many days over the past month did your transplant keep you from your normal activities? 0    Financial Information     Medication Assistance provided: None Required    Anticipated copay of $0 on part b reviewed with patient. Verified delivery address.    Delivery Information     Scheduled delivery date: 09/06/2019    Expected start date: pt knows to contact coordinator once envarsus is in hand to discuss when to start and labwork. I am messaging clinic today as well.    Medication will be delivered via Next Day Courier to the home address in [place].  This shipment will require a signature.      Explained the services we provide at [facility] and  that each month we would call to set up refills.  Stressed importance of returning phone calls so that we could ensure they receive their medications in time each month.  Informed patient that we should be setting up refills 7-10 days prior to when they will run out of medication.  A pharmacist will reach out to perform a clinical assessment periodically.  Informed patient that a welcome packet and a drug information handout will be sent.      Patient verbalized understanding of the above information as well as how to contact the pharmacy at [phone] option 4 with any questions/concerns.  The pharmacy is open Monday through Friday 8:30am-4:30pm.  A pharmacist is available 24/7 via pager to answer any clinical questions they may have.    Patient Specific Needs     ? Does the patient have any physical, cognitive, or cultural barriers? No    ? Patient prefers to have medications discussed with  Patient     ? Is the patient able to read and understand education materials at a high school level or above? Yes    ? Patient's primary language is  [place]     ? Is the patient high risk? Yes, patient taking a REMS drug     ? Does the patient require a Care Management Plan? No     ? Does the patient require physician intervention or other additional services (i.e. nutrition, smoking cessation, social work)? No      Thad Ranger  [facility] Specialty Pharmacist

## 2019-09-04 NOTE — Unmapped (Signed)
[facility] Specialty Medication Onboarding    Specialty Medication: ENVARSUS 1MG   Prior Authorization: Not Required   Financial Assistance: No - copay  <$25  Final Copay/Day Supply: $0 / 30 DAYS     Insurance Restrictions: None     Notes to Pharmacist: DOSE AND FORMULATION CHANGE    The triage team has completed the benefits investigation and has determined that the patient is able to fill this medication at [facility]. Please contact the patient to complete the onboarding or follow up with the prescribing physician as needed.

## 2019-09-04 NOTE — Unmapped (Signed)
[facility] Clinical Assessment & Refill Coordination Note    Louis Dennis, [place]: November 28, 1961  Phone: [phone] (home)     All above HIPAA information was verified with patient. via [place] interpreter    Specialty Medication(s):   Transplant: Envarsus 1mg , Myfortic 180mg , tacrolimus 1mg , Sensipar 60mg  and valgancyclovir 450mg      Current Outpatient Medications   Medication Sig Dispense Refill   ??? acetaminophen (TYLENOL) 500 MG tablet Take 1-2 tablets (500-1,000 mg total) by mouth every six (6) hours as needed for pain. 100 tablet 0   ??? allopurinoL (ZYLOPRIM) 100 MG tablet Take 1 tablet (100 mg total) by mouth daily. 30 tablet 11   ??? aspirin (ECOTRIN) 81 MG tablet Take 1 tablet (81 mg total) by mouth daily. 30 tablet 11   ??? biotin 5 mg cap Take 1 capsule (5,000 mcg total) by mouth daily.  0   ??? cinacalcet (SENSIPAR) 60 MG tablet Take 3 tablets (180 mg total) by mouth daily. 90 tablet 11   ??? dapsone 100 MG tablet Take 1 tablet (100 mg total) by mouth daily. 30 tablet 4   ??? magnesium oxide-Mg AA chelate (MAGNESIUM, AMINO ACID CHELATE,) 133 mg Tab Take 3 tablets by mouth Two (2) times a day. 200 tablet 5   ??? metoprolol tartrate (LOPRESSOR) 25 MG tablet Take 0.5 tablets (12.5 mg total) by mouth Two (2) times a day. 30 tablet 11   ??? MYFORTIC 180 mg EC tablet Take 3 tablets (540 mg total) by mouth Two (2) times a day. 180 tablet 11   ??? tacrolimus (ENVARSUS XR) 1 mg Tb24 extended release tablet Take 3 tablets (3 mg total) by mouth daily. 90 tablet 11   ??? tacrolimus (PROGRAF) 1 MG capsule Take 3 capsules (3 mg) by mouth in the morning and 2 capsules (2 mg) at night 150 capsule 11     No current facility-administered medications for this visit.         Changes to medications: see tac/envarsus change below    No Known Allergies    Changes to allergies: No    SPECIALTY MEDICATION ADHERENCE     envarsus 1mg  : 0 days of medicine on hand - will start on this fill  Tacrolimus 1mg   : 3 weeks of medicine on hand   Myfortic 180mg   : 3 weeks of medicine on hand   valganciclovir 450mg   : 3 weeks of medicine on hand   sensipar 60mg   : 7 days of medicine on hand - pt states this was already set up to fill today with another team member    Medication Adherence    Patient reported X missed doses in the last month: 0  Specialty Medication: tacrolimus 1mg   Patient is on additional specialty medications: Yes  Additional Specialty Medications: Myfortic 180mg   Patient Reported Additional Medication X Missed Doses in the Last Month: 0  Patient is on more than two specialty medications: Yes  Specialty Medication: valganciclovir 450mg   Patient Reported Additional Medication X Missed Doses in the Last Month: 0  Specialty Medication: sensipar 60mg   Patient Reported Additional Medication X Missed Doses in the Last Month: 0          Specialty medication(s) dose(s) confirmed: Patient reports changes to the regimen as follows: starting envarsus (see today's onboarding), d/c tac     Are there any concerns with adherence? No    Adherence counseling provided? Not needed    CLINICAL MANAGEMENT AND INTERVENTION  Clinical Benefit Assessment:    Do you feel the medicine is effective or helping your condition? Yes    Clinical Benefit counseling provided? Not needed    Adverse Effects Assessment:    Are you experiencing any side effects? No    Are you experiencing difficulty administering your medicine? No    Quality of Life Assessment:    How many days over the past month did your transplant  keep you from your normal activities? For example, brushing your teeth or getting up in the morning. 0    Have you discussed this with your provider? Not needed    Therapy Appropriateness:    Is therapy appropriate? Yes, therapy is appropriate and should be continued    DISEASE/MEDICATION-SPECIFIC INFORMATION      N/A    PATIENT SPECIFIC NEEDS     ? Does the patient have any physical, cognitive, or cultural barriers? No    ? Is the patient high risk? Yes, patient taking a REMS drug     ? Does the patient require a Care Management Plan? No     ? Does the patient require physician intervention or other additional services (i.e. nutrition, smoking cessation, social work)? No      SHIPPING     Specialty Medication(s) to be Shipped:   none    Other medication(s) to be shipped: n/a-  envarsus was set up on onboarding note today  Pt states sensipar was set up by another team member to fill today  Pt wants call back next week on all other meds     Changes to insurance: No    Delivery Scheduled: see onboarding note for envarsus shipping, pt wants call back next week on other meds     Medication will be delivered via n/a to the confirmed n/a address in [facility].    The patient will receive a drug information handout for each medication shipped and additional FDA Medication Guides as required.  Verified that patient has previously received a Conservation officer, historic buildings.    All of the patient's questions and concerns have been addressed.    Thad Ranger   [facility] Specialty Pharmacist

## 2019-09-05 LAB — CMV DNA, QUANTITATIVE, PCR: CMV VIRAL LD: DETECTED — AB

## 2019-09-05 LAB — CMV QUANT LOG10: Lab: 0

## 2019-09-05 MED FILL — ENVARSUS XR 1 MG TABLET,EXTENDED RELEASE: 30 days supply | Qty: 90 | Fill #0 | Status: AC

## 2019-09-09 ENCOUNTER — Ambulatory Visit: Admit: 2019-09-09 | Discharge: 2019-09-10 | Payer: MEDICARE

## 2019-09-09 DIAGNOSIS — D899 Disorder involving the immune mechanism, unspecified: Secondary | ICD-10-CM

## 2019-09-09 DIAGNOSIS — Z940 Kidney transplant status: Secondary | ICD-10-CM

## 2019-09-09 DIAGNOSIS — Z79899 Other long term (current) drug therapy: Secondary | ICD-10-CM

## 2019-09-09 LAB — CBC W/ AUTO DIFF
BASOPHILS ABSOLUTE COUNT: 0 10*9/L (ref 0.0–0.1)
BASOPHILS RELATIVE PERCENT: 0.8 %
EOSINOPHILS ABSOLUTE COUNT: 0.3 10*9/L (ref 0.0–0.4)
EOSINOPHILS RELATIVE PERCENT: 5.3 %
HEMATOCRIT: 33.4 % — ABNORMAL LOW (ref 41.0–53.0)
LARGE UNSTAINED CELLS: 3 % (ref 0–4)
LYMPHOCYTES ABSOLUTE COUNT: 0.2 10*9/L — ABNORMAL LOW (ref 1.5–5.0)
LYMPHOCYTES RELATIVE PERCENT: 3.5 %
MEAN CORPUSCULAR HEMOGLOBIN CONC: 32.2 g/dL (ref 31.0–37.0)
MEAN CORPUSCULAR HEMOGLOBIN: 33.1 pg (ref 26.0–34.0)
MEAN CORPUSCULAR VOLUME: 102.7 fL — ABNORMAL HIGH (ref 80.0–100.0)
MEAN PLATELET VOLUME: 7.2 fL (ref 7.0–10.0)
MONOCYTES ABSOLUTE COUNT: 0.5 10*9/L (ref 0.2–0.8)
NEUTROPHILS ABSOLUTE COUNT: 3.8 10*9/L (ref 2.0–7.5)
NEUTROPHILS RELATIVE PERCENT: 78.2 %
PLATELET COUNT: 284 10*9/L (ref 150–440)
RED BLOOD CELL COUNT: 3.25 10*12/L — ABNORMAL LOW (ref 4.50–5.90)

## 2019-09-09 LAB — RENAL FUNCTION PANEL
ALBUMIN: 4.9 g/dL (ref 3.5–5.0)
ANION GAP: 12 mmol/L (ref 7–15)
BLOOD UREA NITROGEN: 24 mg/dL — ABNORMAL HIGH (ref 7–21)
BUN / CREAT RATIO: 31
CALCIUM: 10.4 mg/dL — ABNORMAL HIGH (ref 8.5–10.2)
CO2: 24 mmol/L (ref 22.0–30.0)
CREATININE: 0.78 mg/dL (ref 0.70–1.30)
EGFR CKD-EPI AA MALE: >90 mL/min/{1.73_m2} (ref >=60)
EGFR CKD-EPI NON-AA MALE: >90 mL/min/{1.73_m2} (ref >=60)
PHOSPHORUS: 2.6 mg/dL — ABNORMAL LOW (ref 2.9–4.7)
POTASSIUM: 5 mmol/L (ref 3.5–5.0)
SODIUM: 141 mmol/L (ref 135–145)

## 2019-09-09 LAB — TACROLIMUS BLOOD: Lab: 6.3

## 2019-09-09 LAB — MAGNESIUM: Magnesium:MCnc:Pt:Ser/Plas:Qn:: 1.5 — ABNORMAL LOW

## 2019-09-09 LAB — MEAN CORPUSCULAR VOLUME: Lab: 102.7 — ABNORMAL HIGH

## 2019-09-09 LAB — BUN / CREAT RATIO: Urea nitrogen/Creatinine:MRto:Pt:Ser/Plas:Qn:: 31

## 2019-09-09 MED ORDER — METOPROLOL TARTRATE 25 MG TABLET
ORAL_TABLET | Freq: Two times a day (BID) | ORAL | 11 refills | 30 days | Status: CP
Start: 2019-09-09 — End: 2020-09-08
  Filled 2019-09-09: qty 30, 30d supply, fill #0

## 2019-09-09 MED FILL — METOPROLOL TARTRATE 25 MG TABLET: 30 days supply | Qty: 30 | Fill #0 | Status: AC

## 2019-09-11 LAB — CMV COMMENT: Lab: 0

## 2019-09-11 LAB — CMV DNA, QUANTITATIVE, PCR: CMV QUANT: <50 [IU]/mL — ABNORMAL HIGH (ref <0)

## 2019-09-11 NOTE — Unmapped (Signed)
[facility] Refill Coordination Note    Specialty Medication(s) to be Shipped:   Transplant: Myfortic 180mg  and Dapsone 100mg     Other medication(s) to be shipped: Allopurinol 100mg  and magnesium 133mg      [name], DOB: 1961/09/19  Phone: [phone] (home)       All above HIPAA information was verified with patient.     Completed refill call assessment today to schedule patient's medication shipment from the [facility] [phone]).       Specialty medication(s) and dose(s) confirmed: Regimen is correct and unchanged.   Changes to medications: Rowdy reports no changes at this time.  Changes to insurance: No  Questions for the pharmacist: No    Confirmed patient received Welcome Packet with first shipment. The patient will receive a drug information handout for each medication shipped and additional FDA Medication Guides as required.       DISEASE/MEDICATION-SPECIFIC INFORMATION        N/A    SPECIALTY MEDICATION ADHERENCE     Medication Adherence    Patient reported X missed doses in the last month: 1  Specialty Medication: Myfortic 180mg   Patient is on additional specialty medications: Yes  Additional Specialty Medications: Dapsone 100mg   Patient Reported Additional Medication X Missed Doses in the Last Month: 1  Patient is on more than two specialty medications: No          Myfortic 180 mg: 10 days of medicine on hand   Dapsone 100 mg: 14 days of medicine on hand     SHIPPING     Shipping address confirmed in Epic.     Delivery Scheduled: Yes, Expected medication delivery date: 09/20/2019.     Medication will be delivered via Next Day Courier to the home address in Epic [place].    [name]   [facility] Specialty Technician

## 2019-09-12 ENCOUNTER — Ambulatory Visit: Admit: 2019-09-12 | Discharge: 2019-09-25 | Payer: MEDICARE

## 2019-09-12 DIAGNOSIS — Z940 Kidney transplant status: Secondary | ICD-10-CM

## 2019-09-12 DIAGNOSIS — Z79899 Other long term (current) drug therapy: Secondary | ICD-10-CM

## 2019-09-13 DIAGNOSIS — Z940 Kidney transplant status: Secondary | ICD-10-CM

## 2019-09-13 DIAGNOSIS — Z79899 Other long term (current) drug therapy: Secondary | ICD-10-CM

## 2019-09-16 ENCOUNTER — Ambulatory Visit: Admit: 2019-09-16 | Discharge: 2019-09-17 | Payer: MEDICARE

## 2019-09-16 LAB — CBC W/ AUTO DIFF
BASOPHILS ABSOLUTE COUNT: 0.1 10*9/L (ref 0.0–0.1)
BASOPHILS RELATIVE PERCENT: 0.8 %
EOSINOPHILS ABSOLUTE COUNT: 0.5 10*9/L — ABNORMAL HIGH (ref 0.0–0.4)
EOSINOPHILS RELATIVE PERCENT: 7.1 %
HEMATOCRIT: 34.1 % — ABNORMAL LOW (ref 41.0–53.0)
HEMOGLOBIN: 10.9 g/dL — ABNORMAL LOW (ref 13.5–17.5)
LARGE UNSTAINED CELLS: 2 % (ref 0–4)
LYMPHOCYTES ABSOLUTE COUNT: 0.2 10*9/L — ABNORMAL LOW (ref 1.5–5.0)
LYMPHOCYTES RELATIVE PERCENT: 3.6 %
MEAN CORPUSCULAR HEMOGLOBIN CONC: 32 g/dL (ref 31.0–37.0)
MEAN CORPUSCULAR HEMOGLOBIN: 32.7 pg (ref 26.0–34.0)
MEAN CORPUSCULAR VOLUME: 102.3 fL — ABNORMAL HIGH (ref 80.0–100.0)
MEAN PLATELET VOLUME: 7.3 fL (ref 7.0–10.0)
MONOCYTES ABSOLUTE COUNT: 0.6 10*9/L (ref 0.2–0.8)
NEUTROPHILS ABSOLUTE COUNT: 5 10*9/L (ref 2.0–7.5)
PLATELET COUNT: 273 10*9/L (ref 150–440)
RED BLOOD CELL COUNT: 3.33 10*12/L — ABNORMAL LOW (ref 4.50–5.90)
WBC ADJUSTED: 6.4 10*9/L (ref 4.5–11.0)

## 2019-09-16 LAB — BASIC METABOLIC PANEL
ANION GAP: 14 mmol/L (ref 7–15)
BLOOD UREA NITROGEN: 20 mg/dL (ref 7–21)
BUN / CREAT RATIO: 24
CALCIUM: 10.3 mg/dL — ABNORMAL HIGH (ref 8.5–10.2)
CHLORIDE: 100 mmol/L (ref 98–107)
CO2: 24 mmol/L (ref 22.0–30.0)
EGFR CKD-EPI NON-AA MALE: >90 mL/min/{1.73_m2} (ref >=60)
GLUCOSE RANDOM: 119 mg/dL (ref 70–179)
SODIUM: 138 mmol/L (ref 135–145)

## 2019-09-16 LAB — MEAN PLATELET VOLUME: Lab: 7.3

## 2019-09-16 LAB — PHOSPHORUS: Phosphate:MCnc:Pt:Ser/Plas:Qn:: 2.5 — ABNORMAL LOW

## 2019-09-16 LAB — POTASSIUM: Potassium:SCnc:Pt:Ser/Plas:Qn:: 5.1 — ABNORMAL HIGH

## 2019-09-16 LAB — MAGNESIUM: Magnesium:MCnc:Pt:Ser/Plas:Qn:: 1.5 — ABNORMAL LOW

## 2019-09-17 LAB — HLA DS POST TRANSPLANT
ANTI-DONOR DRW #1 MFI: 79 MFI
ANTI-DONOR DRW #2 MFI: 84 MFI
ANTI-DONOR HLA-A #1 MFI: 0 MFI
ANTI-DONOR HLA-A #2 MFI: 0 MFI
ANTI-DONOR HLA-B #1 MFI: 0 MFI
ANTI-DONOR HLA-B #2 MFI: 0 MFI
ANTI-DONOR HLA-C #1 MFI: 0 MFI
ANTI-DONOR HLA-C #2 MFI: 0 MFI
ANTI-DONOR HLA-DQB #1 MFI: 47 MFI
ANTI-DONOR HLA-DQB #2 MFI: 112 MFI
ANTI-DONOR HLA-DR #1 MFI: 43 MFI
ANTI-DONOR HLA-DR #2 MFI: 68 MFI

## 2019-09-17 LAB — FSAB CLASS 1 ANTIBODY SPECIFICITY

## 2019-09-17 LAB — HLA CL2 AB COMMENT: Lab: 0

## 2019-09-17 LAB — CMV QUANT LOG10: Lab: 1.85 — ABNORMAL HIGH

## 2019-09-17 LAB — CMV DNA, QUANTITATIVE, PCR: CMV QUANT LOG10: 1.85 {Log_IU}/mL — ABNORMAL HIGH (ref <0.00)

## 2019-09-17 LAB — DSA PT DONOR ID

## 2019-09-17 LAB — HLA CLASS 1 ANTIBODY

## 2019-09-19 MED FILL — ALLOPURINOL 100 MG TABLET: 30 days supply | Qty: 30 | Fill #2 | Status: AC

## 2019-09-19 MED FILL — DAPSONE 100 MG TABLET: ORAL | 30 days supply | Qty: 30 | Fill #2

## 2019-09-19 MED FILL — MYFORTIC 180 MG TABLET,DELAYED RELEASE: ORAL | 30 days supply | Qty: 180 | Fill #3

## 2019-09-19 MED FILL — MG-PLUS-PROTEIN 133 MG TABLET: 33 days supply | Qty: 200 | Fill #3 | Status: AC

## 2019-09-19 MED FILL — MYFORTIC 180 MG TABLET,DELAYED RELEASE: 30 days supply | Qty: 180 | Fill #3 | Status: AC

## 2019-09-19 MED FILL — ALLOPURINOL 100 MG TABLET: ORAL | 30 days supply | Qty: 30 | Fill #2

## 2019-09-19 MED FILL — DAPSONE 100 MG TABLET: 30 days supply | Qty: 30 | Fill #2 | Status: AC

## 2019-09-19 MED FILL — MG-PLUS-PROTEIN 133 MG TABLET: ORAL | 33 days supply | Qty: 200 | Fill #3

## 2019-09-23 ENCOUNTER — Ambulatory Visit: Admit: 2019-09-23 | Discharge: 2019-09-24 | Payer: MEDICARE

## 2019-09-23 LAB — BASIC METABOLIC PANEL
ANION GAP: 11 mmol/L (ref 7–15)
BUN / CREAT RATIO: 27
CALCIUM: 10.1 mg/dL (ref 8.5–10.2)
CHLORIDE: 105 mmol/L (ref 98–107)
CO2: 26 mmol/L (ref 22.0–30.0)
CREATININE: 0.77 mg/dL (ref 0.70–1.30)
EGFR CKD-EPI AA MALE: >90 mL/min/{1.73_m2} (ref >=60)
EGFR CKD-EPI NON-AA MALE: >90 mL/min/{1.73_m2} (ref >=60)
GLUCOSE RANDOM: 113 mg/dL (ref 70–179)
POTASSIUM: 4.5 mmol/L (ref 3.5–5.0)
SODIUM: 142 mmol/L (ref 135–145)

## 2019-09-23 LAB — CBC W/ AUTO DIFF
BASOPHILS ABSOLUTE COUNT: 0 10*9/L (ref 0.0–0.1)
BASOPHILS RELATIVE PERCENT: 0.6 %
EOSINOPHILS ABSOLUTE COUNT: 0.4 10*9/L (ref 0.0–0.4)
HEMATOCRIT: 33.5 % — ABNORMAL LOW (ref 41.0–53.0)
HEMOGLOBIN: 10.7 g/dL — ABNORMAL LOW (ref 13.5–17.5)
LARGE UNSTAINED CELLS: 2 % (ref 0–4)
LYMPHOCYTES ABSOLUTE COUNT: 0.3 10*9/L — ABNORMAL LOW (ref 1.5–5.0)
LYMPHOCYTES RELATIVE PERCENT: 5.1 %
MEAN CORPUSCULAR HEMOGLOBIN CONC: 31.9 g/dL (ref 31.0–37.0)
MEAN CORPUSCULAR VOLUME: 101.7 fL — ABNORMAL HIGH (ref 80.0–100.0)
MEAN PLATELET VOLUME: 7.7 fL (ref 7.0–10.0)
MONOCYTES ABSOLUTE COUNT: 0.5 10*9/L (ref 0.2–0.8)
MONOCYTES RELATIVE PERCENT: 8.8 %
NEUTROPHILS ABSOLUTE COUNT: 4.2 10*9/L (ref 2.0–7.5)
NEUTROPHILS RELATIVE PERCENT: 75.3 %
PLATELET COUNT: 282 10*9/L (ref 150–440)
RED BLOOD CELL COUNT: 3.29 10*12/L — ABNORMAL LOW (ref 4.50–5.90)
RED CELL DISTRIBUTION WIDTH: 15.5 % — ABNORMAL HIGH (ref 12.0–15.0)
WBC ADJUSTED: 5.6 10*9/L (ref 4.5–11.0)

## 2019-09-23 LAB — PHOSPHORUS: Phosphate:MCnc:Pt:Ser/Plas:Qn:: 2.5 — ABNORMAL LOW

## 2019-09-23 LAB — NEUTROPHILS RELATIVE PERCENT: Neutrophils/100 leukocytes:NFr:Pt:Bld:Qn:Automated count: 75.3

## 2019-09-23 LAB — MAGNESIUM
MAGNESIUM: 1.5 mg/dL — ABNORMAL LOW (ref 1.6–2.2)
Magnesium:MCnc:Pt:Ser/Plas:Qn:: 1.5 — ABNORMAL LOW

## 2019-09-23 LAB — CREATININE: Creatinine:MCnc:Pt:Ser/Plas:Qn:: 0.77

## 2019-09-25 NOTE — Unmapped (Addendum)
[facility] Refill Coordination Note    Specialty Medication(s) to be Shipped:   Transplant: Envarsus 1mg , Sensipar 60mg  and valgancyclovir 450mg     Other medication(s) to be shipped: Metoprolol tart 25mg  and aspirin 81mg      Louis Dennis, DOB: 06-24-1961  Phone: [phone] (home)       All above HIPAA information was verified with patient.     Completed refill call assessment today to schedule patient's medication shipment from the [facility] [phone]).       Specialty medication(s) and dose(s) confirmed: Regimen is correct and unchanged.   Changes to medications: Bronc reports no changes at this time.  Changes to insurance: No  Questions for the pharmacist: No    Confirmed patient received Welcome Packet with first shipment. The patient will receive a drug information handout for each medication shipped and additional FDA Medication Guides as required.       DISEASE/MEDICATION-SPECIFIC INFORMATION        N/A    SPECIALTY MEDICATION ADHERENCE     Medication Adherence    Patient reported X missed doses in the last month: 0  Specialty Medication: Envarsus 1mg   Patient is on additional specialty medications: Yes  Additional Specialty Medications: Sensipar 60mg   Patient Reported Additional Medication X Missed Doses in the Last Month: 0  Patient is on more than two specialty medications: No          Envarsus 1 mg: 10 days of medicine on hand   Sensipar 60 mg: 10 days of medicine on hand     SHIPPING     Shipping address confirmed in Epic.     Delivery Scheduled: Yes, Expected medication delivery date: 10/04/2019.     Medication will be delivered via Next Day Courier to the home address in Epic [place].    [name]   [facility] Specialty Technician

## 2019-09-26 LAB — CMV DNA, QUANTITATIVE, PCR

## 2019-09-26 LAB — CMV VIRAL LD: Lab: 0

## 2019-09-30 ENCOUNTER — Ambulatory Visit: Admit: 2019-09-30 | Discharge: 2019-10-01 | Payer: MEDICARE

## 2019-09-30 DIAGNOSIS — Z940 Kidney transplant status: Principal | ICD-10-CM

## 2019-09-30 DIAGNOSIS — E215 Disorder of parathyroid gland, unspecified: Principal | ICD-10-CM

## 2019-09-30 DIAGNOSIS — Z79899 Other long term (current) drug therapy: Principal | ICD-10-CM

## 2019-09-30 LAB — BASIC METABOLIC PANEL
ANION GAP: 11 mmol/L (ref 7–15)
BLOOD UREA NITROGEN: 20 mg/dL (ref 7–21)
BUN / CREAT RATIO: 28
CALCIUM: 10.2 mg/dL (ref 8.5–10.2)
CHLORIDE: 105 mmol/L (ref 98–107)
CO2: 25 mmol/L (ref 22.0–30.0)
CREATININE: 0.72 mg/dL (ref 0.70–1.30)
EGFR CKD-EPI AA MALE: >90 mL/min/{1.73_m2} (ref >=60)
GLUCOSE RANDOM: 124 mg/dL (ref 70–179)
POTASSIUM: 4.8 mmol/L (ref 3.5–5.0)
SODIUM: 141 mmol/L (ref 135–145)

## 2019-09-30 LAB — CBC W/ AUTO DIFF
BASOPHILS ABSOLUTE COUNT: 0 10*9/L (ref 0.0–0.1)
BASOPHILS RELATIVE PERCENT: 0.7 %
EOSINOPHILS ABSOLUTE COUNT: 0.5 10*9/L — ABNORMAL HIGH (ref 0.0–0.4)
EOSINOPHILS RELATIVE PERCENT: 8.4 %
HEMOGLOBIN: 10.5 g/dL — ABNORMAL LOW (ref 13.5–17.5)
LARGE UNSTAINED CELLS: 4 % (ref 0–4)
LYMPHOCYTES ABSOLUTE COUNT: 0.2 10*9/L — ABNORMAL LOW (ref 1.5–5.0)
LYMPHOCYTES RELATIVE PERCENT: 3 %
MEAN CORPUSCULAR HEMOGLOBIN: 32.5 pg (ref 26.0–34.0)
MEAN CORPUSCULAR VOLUME: 102 fL — ABNORMAL HIGH (ref 80.0–100.0)
MEAN PLATELET VOLUME: 7.3 fL (ref 7.0–10.0)
MONOCYTES ABSOLUTE COUNT: 0.5 10*9/L (ref 0.2–0.8)
MONOCYTES RELATIVE PERCENT: 8.2 %
NEUTROPHILS ABSOLUTE COUNT: 4.7 10*9/L (ref 2.0–7.5)
NEUTROPHILS RELATIVE PERCENT: 76 %
PLATELET COUNT: 253 10*9/L (ref 150–440)
RED BLOOD CELL COUNT: 3.24 10*12/L — ABNORMAL LOW (ref 4.50–5.90)
RED CELL DISTRIBUTION WIDTH: 16.1 % — ABNORMAL HIGH (ref 12.0–15.0)
WBC ADJUSTED: 6.2 10*9/L (ref 4.5–11.0)

## 2019-09-30 LAB — PHOSPHORUS: Phosphate:MCnc:Pt:Ser/Plas:Qn:: 2.5 — ABNORMAL LOW

## 2019-09-30 LAB — MONOCYTES RELATIVE PERCENT: Monocytes/100 leukocytes:NFr:Pt:Bld:Qn:Automated count: 8.2

## 2019-09-30 LAB — CALCIUM: Calcium:MCnc:Pt:Ser/Plas:Qn:: 10.2

## 2019-09-30 LAB — MAGNESIUM
MAGNESIUM: 1.6 mg/dL (ref 1.6–2.2)
Magnesium:MCnc:Pt:Ser/Plas:Qn:: 1.6

## 2019-09-30 NOTE — Unmapped (Signed)
Reviewed parathyroid scan with Dr. Nestor Lewandowsky, pt referred to surgery oncology for parathyroid adenomas. Called pt using [employer] [phone]), made pt aware of referral and surg onc will call to set up appt. Pt verbalized understanding.

## 2019-10-01 LAB — CMV DNA, QUANTITATIVE, PCR

## 2019-10-01 LAB — CMV QUANT: Lab: 412 — ABNORMAL HIGH

## 2019-10-02 ENCOUNTER — Ambulatory Visit: Admit: 2019-10-02 | Discharge: 2019-10-03 | Payer: MEDICARE

## 2019-10-02 ENCOUNTER — Ambulatory Visit: Admit: 2019-10-02 | Discharge: 2019-10-03 | Payer: MEDICARE | Attending: Nephrology | Primary: Nephrology

## 2019-10-02 DIAGNOSIS — Z940 Kidney transplant status: Principal | ICD-10-CM

## 2019-10-02 DIAGNOSIS — Z79899 Other long term (current) drug therapy: Principal | ICD-10-CM

## 2019-10-02 DIAGNOSIS — D899 Disorder involving the immune mechanism, unspecified: Principal | ICD-10-CM

## 2019-10-02 LAB — URINALYSIS
BILIRUBIN UA: NEGATIVE
BLOOD UA: NEGATIVE
KETONES UA: NEGATIVE
LEUKOCYTE ESTERASE UA: NEGATIVE
NITRITE UA: NEGATIVE
PH UA: 6 (ref 5.0–9.0)
PROTEIN UA: NEGATIVE
RBC UA: <1 /HPF (ref <=3)
SPECIFIC GRAVITY UA: 1.014 (ref 1.003–1.030)
SQUAMOUS EPITHELIAL: <1 /HPF (ref 0–5)
UROBILINOGEN UA: 0.2
WBC UA: <1 /HPF (ref <=2)

## 2019-10-02 LAB — BLOOD UA: Hemoglobin:PrThr:Pt:Urine:Ord:Test strip: NEGATIVE

## 2019-10-02 LAB — PROTEIN/CREAT RATIO, URINE: Protein/Creatinine:MRto:Pt:Urine:Qn:: 0.079

## 2019-10-02 MED ORDER — VALGANCICLOVIR 450 MG TABLET
ORAL_TABLET | Freq: Two times a day (BID) | ORAL | 11 refills | 30.00000 days | Status: CP
Start: 2019-10-02 — End: 2019-10-02
  Filled 2019-10-03: qty 120, 30d supply, fill #0

## 2019-10-02 MED ORDER — VALGANCICLOVIR 450 MG TABLET: 900 mg | tablet | Freq: Every day | 11 refills | 30 days | Status: AC

## 2019-10-02 NOTE — Unmapped (Signed)
Valganciclovir dose change from last fill on 07/22/19  Copay = $0

## 2019-10-02 NOTE — Unmapped (Signed)
Transplant Nephrology Clinic Visit    History of Present Illness  [name] is a 58 y.o. 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  -Adequate perfusion of the transplant kidney. Resistive indices within the mid main renal artery and at the anastomosis are slightly elevated. Attention on follow-up.  ??    Current Immunosuppression Regimen:   - Myfortic 540mg  BID  - Envarsus 3mg  QD      Subjective/Interval:   Since last visit, patient switched from Prograf to Envarsus and is doing well.  He had a parathyroid scan due to hypercalcemia and parathyroid adenomas were found.  Patient has been referred to surgical oncology for treatment/removal. Patient states he used Debrox since we last saw him and the ringing in his ears has decreased tremendously.  He states it really helped and now he only gets some ringing in the ears at night.  When this occurs he follows a youtube video regarding pressure points that seems to help.  He states the vertigo has also stopped.  He informed me that he no longer is having heart palpitations or racing heart since his ears were cleaned.     Patient has an appointment with Endocrinology on 10/23/19.  He denies headaches, dizziness, lightheadedness, chest pain, shortness of breath, fever, chills, abdominal pain, dysuria, hematuria, n/v/d, edema, and tremors.         Review of Systems  Otherwise as per HPI, all other systems reviewed and are negative.    Medications  Current Outpatient Medications   Medication Sig Dispense Refill   ??? acetaminophen (TYLENOL) 500 MG tablet Take 1-2 tablets (500-1,000 mg total) by mouth every six (6) hours as needed for pain. 100 tablet 0   ??? allopurinoL (ZYLOPRIM) 100 MG tablet Take 1 tablet (100 mg total) by mouth daily. 30 tablet 11   ??? aspirin (ECOTRIN) 81 MG tablet Take 1 tablet (81 mg total) by mouth daily. 30 tablet 11   ??? biotin 5 mg cap Take 1 capsule (5,000 mcg total) by mouth daily.  0   ??? cinacalcet (SENSIPAR) 60 MG tablet Take 3 tablets (180 mg total) by mouth daily. 90 tablet 11   ??? dapsone 100 MG tablet Take 1 tablet (100 mg total) by mouth daily. 30 tablet 4   ??? magnesium oxide-Mg AA chelate (MAGNESIUM, AMINO ACID CHELATE,) 133 mg Tab Take 3 tablets by mouth Two (2) times a day. 200 tablet 5   ??? metoprolol tartrate (LOPRESSOR) 25 MG tablet Take one-half tablet (12.5 mg total) by mouth Two (2) times a day. 30 tablet 11   ??? MYFORTIC 180 mg EC tablet Take 3 tablets (540 mg  total) by mouth Two (2) times a day. 180 tablet 11   ??? tacrolimus (ENVARSUS XR) 1 mg Tb24 extended release tablet Take 3 tablets (3 mg total) by mouth daily. 90 tablet 11 ??? valGANciclovir (VALCYTE) 450 mg tablet Take 2 tablets (900 mg total) by mouth Two (2) times a day. 120 tablet 1     No current facility-administered medications for this visit.        Physical Exam  BP 122/70 (BP Site: L Arm, BP Position: Sitting, BP Cuff Size: Medium)  - Pulse 72  - Temp 36.4 ??C (97.5 ??F)  - Resp 12  - Wt 66.4 kg (146 lb 6.2 oz)  - BMI 25.13 kg/m??   General: Well appearing male, no acute distress  HEENT: wearing mask, EOMI, sclera anicteric; bilateral ear drums visible without erythema or bulging; minimal ear wax present;  Neck: Neck supple, no cervical lymphadenopathy appreciated. No overt palpable thyroid nodules. No tenderness with palaption.  CV: normal rate, normal rhythm, no murmur, no gallops, no rubs appreciated  Lungs: clear to auscultation bilaterally, normal work of breathing.  Abdomen: soft, non tender, RLQ surgical site healed.  Extremities: no edema  Musculoskeletal: no visible deformity, normal range of motion.  Pulses: intact distally throughout  Neurologic: awake, alert, and oriented x3    Laboratory Data and Imaging reviewed in EPIC    Assessment: [name] is a 58 y.o. 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     Recommendations/Plan:   Allograft Function: renal function stable at 0.72 with a baseline of approximately 0.7-0.9 since transplant. Will continue to monitor.     Immunosuppression Management [High Risk Medical Decision Making For Drug Therapy Requiring Intensive Monitoring For Toxicity]:  Tacrolimus trough not drawn.  Targeting tacrolimus trough level of approximately 8-10. Patient on Envarsus 3mg  every day.  Continue Myfortic 540 mg BID    Blood Pressure Management: BP 122/70 at this visit. Continue Metoprolol 12.5 mg BID.     Infectious Prophylaxis and Monitoring: Transitioned from Bactrim to Dapsone on 07/04/19 due to stable mild hyperkalemia.  No decoys present 09/03/19.  CMV VL 412 on 09/30/19, Monday's pending.   The patient continues on Dapsone prophylaxis.   Restart Valcyte 900mg  every day and continue to check levels    Hypercalcemia: Persistent elevated 12 - 12.5 since transplantation despite progressive up-titration of Sensipar. Reports compliance and no offending medications identified. PTH (06/24/19) 242.6.   Calcium level on 09/30/19 was 10.2  - Continue Sensipar 180mg  daily  - Referred to Endocrinology - Appt 10/23/19  - L Thyroid nodule FNA (05/13/19) with benign thyroid epithelium and colloid  - Routine cancer screening up to date prior to transplant  - Parathyroid US showed adenomas.  Patient referred to Surgical Oncology    Lower Back Pain with midline radiculopathy: Known underlying degenerative changes. Resolved per Patient.     Hypomagnesemia: Stable at 1.6, likely due to tarolimus. Continue maintenance oral supplementation    Lipid Management: Lipid panel drawn 03/01/19 showed LDL 80, HDL 36, TChol 151.  Will continue to monitor     Atrial Fibrillation: Last EKG showed NSR.  Patient saw his cardiologist on 05/21/19 (care everywhere).  Echo on 05/08/19 showed EF 50-55%.  Atrial fibrillation diagnosed 05/2019. Heart palpitations have stopped per patient.     Ringing in Ears: Patient used over the counter Debrox to remove ear wax. Ringing has now decreased substantially.  Health Maintenance:   - Colonoscopy due 2029  - Vaccination: PCV-23 completed 07/06/2015.   -Flu shot: 08/06/19  - Prevnar 13 to be given at 1 year post    Counseling:  I counseled the patient on the need to avoid sun exposure and the use of sunblock while outdoors given the relatively higher risk of skin malignancy in an immunosuppressed state and the need for adherence to immunosuppression medication.  Patient verbalized understanding.     Follow-Up:  Return to clinic in 4 weeks Patient will continue to follow-up with his primary care provider for non-transplant related issues and medication refills. We have ordered transplant specific labs per the center's guidelines to monitor and assess for toxicities from immunosuppressant drug therapy

## 2019-10-03 MED FILL — SENSIPAR 60 MG TABLET: 30 days supply | Qty: 90 | Fill #3 | Status: AC

## 2019-10-03 MED FILL — METOPROLOL TARTRATE 25 MG TABLET: ORAL | 30 days supply | Qty: 30 | Fill #1

## 2019-10-03 MED FILL — METOPROLOL TARTRATE 25 MG TABLET: 30 days supply | Qty: 30 | Fill #1 | Status: AC

## 2019-10-03 MED FILL — ENVARSUS XR 1 MG TABLET,EXTENDED RELEASE: 30 days supply | Qty: 90 | Fill #1 | Status: AC

## 2019-10-03 MED FILL — VALGANCICLOVIR 450 MG TABLET: 30 days supply | Qty: 120 | Fill #0 | Status: AC

## 2019-10-03 MED FILL — ASPIRIN 81 MG TABLET,DELAYED RELEASE: ORAL | 30 days supply | Qty: 30 | Fill #4

## 2019-10-03 MED FILL — SENSIPAR 60 MG TABLET: ORAL | 30 days supply | Qty: 90 | Fill #3

## 2019-10-03 MED FILL — ASPIRIN 81 MG TABLET,DELAYED RELEASE: 30 days supply | Qty: 30 | Fill #4 | Status: AC

## 2019-10-03 MED FILL — ENVARSUS XR 1 MG TABLET,EXTENDED RELEASE: ORAL | 30 days supply | Qty: 90 | Fill #1

## 2019-10-04 MED ORDER — OMEPRAZOLE 20 MG CAPSULE,DELAYED RELEASE
ORAL_CAPSULE | ORAL | 2 refills | 30.00000 days | Status: CP
Start: 2019-10-04 — End: 2020-10-03
  Filled 2019-10-17: qty 15, 30d supply, fill #0

## 2019-10-04 NOTE — Unmapped (Signed)
[facility] PHARMACY NOTE  10/02/19   Louis Dennis  [id number]    Medication changes today:  1. Start Valcyte 900 mg BID for at least 2 weeks    Education/Adherence tools provided today:  1.  provided additional education on immunosuppression and transplant related medications including reviewing indications of medications, dosing and side effects    Follow up items:  1. goal of understanding indications and dosing of immunosuppression medications  2. Consider starting statin at a later date  3. CMV level in 3 weeks    Next visit with pharmacy in 1 month  ____________________________________________________________________    Louis Dennis is a 58 y.o. male s/p deceased kidney transplant on 06/12/2019 (Kidney) 2/2 HTN.     Other PMH significant for hypertension, gout    Seen by pharmacy today for: medication management and pill box fill and adherence education; last seen by pharmacy 2 months ago.     CC:  Patient complains of tinnitus but reports it has improved to some extent since last visit.    Vitals:    10/02/19 1046   BP: 122/70   Pulse: 72   Resp: 12   Temp: 36.4 ??C (97.6 ??F)       No Known Allergies    All medications reviewed and updated.     Medication list includes revisions made during today???s encounter    Outpatient Encounter Medications as of 10/02/2019   Medication Sig Dispense Refill   ??? acetaminophen (TYLENOL) 500 MG tablet Take 1-2 tablets (500-1,000 mg total) by mouth every six (6) hours as needed for pain. 100 tablet 0   ??? allopurinoL (ZYLOPRIM) 100 MG tablet Take 1 tablet (100 mg total) by mouth daily. 30 tablet 11   ??? aspirin (ECOTRIN) 81 MG tablet Take 1 tablet (81 mg total) by mouth daily. 30 tablet 11   ??? biotin 5 mg cap Take 1 capsule (5,000 mcg total) by mouth daily.  0   ??? cinacalcet (SENSIPAR) 60 MG tablet Take 3 tablets (180 mg total) by mouth daily. 90 tablet 11   ??? dapsone 100 MG tablet Take 1 tablet (100 mg total) by mouth daily. 30 tablet 4   ??? magnesium oxide-Mg AA chelate (MAGNESIUM, AMINO ACID CHELATE,) 133 mg Tab Take 3 tablets by mouth Two (2) times a day. 200 tablet 5   ??? metoprolol tartrate (LOPRESSOR) 25 MG tablet Take one-half tablet (12.5 mg total) by mouth Two (2) times a day. 30 tablet 11   ??? MYFORTIC 180 mg EC tablet Take 3 tablets (540 mg total) by mouth Two (2) times a day. 180 tablet 11   ??? tacrolimus (ENVARSUS XR) 1 mg Tb24 extended release tablet Take 3 tablets (3 mg total) by mouth daily. 90 tablet 11   ??? valGANciclovir (VALCYTE) 450 mg tablet Take 2 tablets (900 mg total) by mouth Two (2) times a day. 120 tablet 1     No facility-administered encounter medications on file as of 10/02/2019.      Induction agent : alemtuzumab    CURRENT IMMUNOSUPPRESSION: Envarsus 3 mg daily prograf/Envarsus/cyclosporine goal: 6-8   myfortic540  mg PO bid    steroid free     Patient is tolerating immunosuppression well     IMMUNOSUPPRESSION DRUG LEVELS:  Lab Results   Component Value Date    Tacrolimus, Trough 8.6 07/04/2019    Tacrolimus, Trough 8.7 06/17/2019    Tacrolimus, Trough 5.3 05/27/2019    Tacrolimus, Timed 6.3 09/09/2019  Tacrolimus, Timed 7.6 09/02/2019    Tacrolimus, Timed 9.6 08/29/2019     No results found for: CYCLO  No results found for: EVEROLIMUS  No results found for: SIROLIMUS    Tac level from today was not processed    Graft function: stable   DSA: ntd  Biopsies to date: ntd  WBC/ANC:  wnl    Plan: Will maintain current immunosuppression. Continue to monitor.    OI Prophylaxis:   CMV Status: D+/ R+, moderate risk . CMV prophylaxis: valganciclovir 450 mg daily x 3 months complete  Lab Results   Component Value Date    CMV Quant 412 (H) 09/30/2019    CMV Quant 238 (H) 09/23/2019    CMV Quant 71 (H) 09/16/2019    CMV Quant <50 (H) 09/09/2019    CMV Quant <50 (H) 09/02/2019    CMV Quant <50 (H) 08/29/2019     PCP Prophylaxis: bactrim SS 1 tab MWF -> dapsone 2/2 hyperkalemia x 6 months.  K pneumo UTI 6/29: completed 7d of cephalexin  Thrush: completed in hospital   Donor positive BCx s. [place]: dapto inpatient - linezolid 600 mg BID outpt thru 7/3 complete  Patient is  tolerating infectious prophylaxis well    Plan: Restart Valcyte 900 mg BID for at least 2 weeks and can stop once VL is undetectable. Continue to monitor.    CV Prophylaxis: asa 81 mg   The 10-year ASCVD risk score Denman George DC Jr., et al., 2013) is: 7.8%  Statin therapy: Indicated; currently on no statin  Plan: consider starting statin at a later date. Continue to monitor     BP/history of paroxysmal AF: Goal < 140/90. Clinic vitals reported above. CHADS2-VASc score = 0  Home BP ranges:100-110s/60s  Current meds include: metoprolol 12.5 mg BID  Plan: out of goal. Decrease metoprolol to 12.5 mg BID.  Continue to monitor    Anemia of CKD:  H/H:   Lab Results   Component Value Date    HGB 10.5 (L) 09/30/2019     Lab Results   Component Value Date    HCT 33.1 (L) 09/30/2019     Iron panel:  No results found for: IRON, TIBC, FERRITIN  No results found for: LABIRON    Prior ESA use: none post transplant    Plan: within goal. Continue to monitor.     DM:   Lab Results   Component Value Date    A1C <4.0 (L) 09/02/2019   . Goal A1c < 7  History of Dm? No  Established with endocrinologist/PCP for BG managment? No  Currently on: no medications  Home BS log:not checking but FBG on labs have been in 110s  Diet: did not address specifics but patient reports to eating well  Exercise:not yet  Fluid intake: ~2.5L daily  Plan:  Continue to monitor    Electrolytes: calcium 10.2  Meds currently on: Sensipar 180 mg daily, mg plus protein 399 mg BID   Plan:  Continue to monitor     GI/BM: pt reports no diarrhea or constipation, no heartburn with PPI  Meds currently on: none  Plan: Continue to monitor    Pain: pt reports no pain  Meds currently on: APAP PRN, [place] to back  Plan: Continue to monitor    Bone health:   Vitamin D Level: 37.7 in July 2020. Goal > 30.   Last DEXA results:  none available  Current meds include: Sensipar 180 mg daily  Plan: Vitamin D level  within goal.  Vitamin D level at next visit.  Awaiting surg onc referral for parathyroidectomy Continue to monitor.     Women's/Men's Health:  Louis Dennis is a 58 y.o. male. Patient reports no men's/women's health issues  Plan: Continue to monitor    Gout - no recent flares  Medications currently on: allopurinol 100 mg daily  Plan: continue to monitor    Hair thinning  Meds currently on: biotin 5 mg daily  Plan: Continue to monitor    Adherence: Patient has good understanding of medications; was able to independently identify names/doses of immunosuppressants and OI meds.  Patient  does not fill their own pill box on a regular basis at home.  His wife manages his medications   Patient brought medication card:yes  Pill box:was correct   Plan: Encouraged patient to learn more about medications; provided extensive adherence counseling/intervention    Spent approximately 20 minutes on educating this patient and greater than 50% was spent in direct face to face counseling regarding post transplant medication education. Questions and concerns were address to patient's satisfaction.    Patient was reviewed with Dr. Lacie Scotts, AGNP who was agreement with the stated plan:     During this visit, the following was completed:   BP log data assessment  Labs ordered and evaluated  complex treatment plan >1 DS   Patient education was completed for 11-24 minutes     All questions/concerns were addressed to the patient's satisfaction.  __________________________________________  Cleone Slim, PHARMD, CPP  [employer] TRANSPLANT CLINICAL PHARMACIST PRACTITIONER  PAGER [phone]

## 2019-10-07 ENCOUNTER — Ambulatory Visit: Admit: 2019-10-07 | Discharge: 2019-10-08 | Payer: MEDICARE

## 2019-10-07 LAB — CBC W/ AUTO DIFF
BASOPHILS ABSOLUTE COUNT: 0 10*9/L (ref 0.0–0.1)
BASOPHILS RELATIVE PERCENT: 0.6 %
EOSINOPHILS ABSOLUTE COUNT: 0.6 10*9/L — ABNORMAL HIGH (ref 0.0–0.4)
EOSINOPHILS RELATIVE PERCENT: 10.4 %
LARGE UNSTAINED CELLS: 3 % (ref 0–4)
LYMPHOCYTES ABSOLUTE COUNT: 0.3 10*9/L — ABNORMAL LOW (ref 1.5–5.0)
LYMPHOCYTES RELATIVE PERCENT: 5.1 %
MEAN CORPUSCULAR HEMOGLOBIN CONC: 32.1 g/dL (ref 31.0–37.0)
MEAN CORPUSCULAR HEMOGLOBIN: 32.5 pg (ref 26.0–34.0)
MEAN CORPUSCULAR VOLUME: 101.3 fL — ABNORMAL HIGH (ref 80.0–100.0)
MEAN PLATELET VOLUME: 7.2 fL (ref 7.0–10.0)
MONOCYTES ABSOLUTE COUNT: 0.4 10*9/L (ref 0.2–0.8)
MONOCYTES RELATIVE PERCENT: 6.2 %
NEUTROPHILS RELATIVE PERCENT: 75 %
PLATELET COUNT: 286 10*9/L (ref 150–440)
RED BLOOD CELL COUNT: 3.25 10*12/L — ABNORMAL LOW (ref 4.50–5.90)
RED CELL DISTRIBUTION WIDTH: 16.3 % — ABNORMAL HIGH (ref 12.0–15.0)
WBC ADJUSTED: 5.8 10*9/L (ref 4.5–11.0)

## 2019-10-07 LAB — BASIC METABOLIC PANEL
BLOOD UREA NITROGEN: 20 mg/dL (ref 7–21)
BUN / CREAT RATIO: 26
CALCIUM: 10.2 mg/dL (ref 8.5–10.2)
CHLORIDE: 106 mmol/L (ref 98–107)
CO2: 24 mmol/L (ref 22.0–30.0)
CREATININE: 0.76 mg/dL (ref 0.70–1.30)
EGFR CKD-EPI AA MALE: >90 mL/min/{1.73_m2} (ref >=60)
EGFR CKD-EPI NON-AA MALE: >90 mL/min/{1.73_m2} (ref >=60)
POTASSIUM: 5.1 mmol/L — ABNORMAL HIGH (ref 3.5–5.0)
SODIUM: 142 mmol/L (ref 135–145)

## 2019-10-07 LAB — RED BLOOD CELL COUNT: Lab: 3.25 — ABNORMAL LOW

## 2019-10-07 LAB — TACROLIMUS, TROUGH: Lab: 6.7

## 2019-10-07 LAB — MAGNESIUM: Magnesium:MCnc:Pt:Ser/Plas:Qn:: 1.6

## 2019-10-07 LAB — PHOSPHORUS: Phosphate:MCnc:Pt:Ser/Plas:Qn:: 2.4 — ABNORMAL LOW

## 2019-10-07 LAB — CO2: Carbon dioxide:SCnc:Pt:Ser/Plas:Qn:: 24

## 2019-10-08 LAB — CMV DNA, QUANTITATIVE, PCR: CMV QUANT LOG10: 2.12 {Log_IU}/mL — ABNORMAL HIGH (ref <0.00)

## 2019-10-08 LAB — CMV VIRAL LD: Lab: 0

## 2019-10-09 NOTE — Unmapped (Signed)
[facility] Refill Coordination Note    Specialty Medication(s) to be Shipped:   Transplant: Myfortic 180mg  and Dapsone 100mg     Other medication(s) to be shipped: Allopurinol 100mg , magnesium, and omeprazole 20mg      Louis Dennis, DOB: [birth year]  Phone: [phone] (home)       All above HIPAA information was verified with patient.     Completed refill call assessment today to schedule patient's medication shipment from the [facility] [phone]).       Specialty medication(s) and dose(s) confirmed: Regimen is correct and unchanged.   Changes to medications: Tegh reports no changes at this time.  Changes to insurance: No  Questions for the pharmacist: No    Confirmed patient received Welcome Packet with first shipment. The patient will receive a drug information handout for each medication shipped and additional FDA Medication Guides as required.       DISEASE/MEDICATION-SPECIFIC INFORMATION        N/A    SPECIALTY MEDICATION ADHERENCE     Medication Adherence    Patient reported X missed doses in the last month: 0  Specialty Medication: Dapsone 100mg   Patient is on additional specialty medications: Yes  Additional Specialty Medications: Myfortic 180mg   Patient Reported Additional Medication X Missed Doses in the Last Month: 0  Patient is on more than two specialty medications: No          Dapsone 100 mg: 9 days of medicine on hand   Myfortic 180 mg: 9 days of medicine on hand     SHIPPING     Shipping address confirmed in Epic.     Delivery Scheduled: Yes, Expected medication delivery date: 10/18/2019.     Medication will be delivered via Next Day Courier to the prescription address in Epic [place].    [name]   [facility] Specialty Technician

## 2019-10-14 ENCOUNTER — Ambulatory Visit: Admit: 2019-10-14 | Discharge: 2019-10-15 | Payer: MEDICARE

## 2019-10-14 LAB — BASIC METABOLIC PANEL
ANION GAP: 10 mmol/L (ref 7–15)
BLOOD UREA NITROGEN: 21 mg/dL (ref 7–21)
BUN / CREAT RATIO: 28
CALCIUM: 10 mg/dL (ref 8.5–10.2)
CHLORIDE: 105 mmol/L (ref 98–107)
CO2: 24 mmol/L (ref 22.0–30.0)
CREATININE: 0.74 mg/dL (ref 0.70–1.30)
EGFR CKD-EPI NON-AA MALE: >90 mL/min/{1.73_m2} (ref >=60)
GLUCOSE RANDOM: 123 mg/dL (ref 70–179)
POTASSIUM: 4.6 mmol/L (ref 3.5–5.0)
SODIUM: 139 mmol/L (ref 135–145)

## 2019-10-14 LAB — CBC W/ AUTO DIFF
BASOPHILS ABSOLUTE COUNT: 0 10*9/L (ref 0.0–0.1)
BASOPHILS RELATIVE PERCENT: 0.7 %
EOSINOPHILS ABSOLUTE COUNT: 0.5 10*9/L — ABNORMAL HIGH (ref 0.0–0.4)
EOSINOPHILS RELATIVE PERCENT: 9.9 %
HEMATOCRIT: 33.9 % — ABNORMAL LOW (ref 41.0–53.0)
LYMPHOCYTES ABSOLUTE COUNT: 0.3 10*9/L — ABNORMAL LOW (ref 1.5–5.0)
LYMPHOCYTES RELATIVE PERCENT: 4.8 %
MEAN CORPUSCULAR HEMOGLOBIN CONC: 31.6 g/dL (ref 31.0–37.0)
MEAN CORPUSCULAR HEMOGLOBIN: 32.4 pg (ref 26.0–34.0)
MEAN CORPUSCULAR VOLUME: 102.5 fL — ABNORMAL HIGH (ref 80.0–100.0)
MEAN PLATELET VOLUME: 7.6 fL (ref 7.0–10.0)
MONOCYTES ABSOLUTE COUNT: 0.1 10*9/L — ABNORMAL LOW (ref 0.2–0.8)
MONOCYTES RELATIVE PERCENT: 0.9 %
NEUTROPHILS ABSOLUTE COUNT: 4.5 10*9/L (ref 2.0–7.5)
NEUTROPHILS RELATIVE PERCENT: 82.1 %
PLATELET COUNT: 272 10*9/L (ref 150–440)
RED BLOOD CELL COUNT: 3.31 10*12/L — ABNORMAL LOW (ref 4.50–5.90)
RED CELL DISTRIBUTION WIDTH: 17 % — ABNORMAL HIGH (ref 12.0–15.0)
WBC ADJUSTED: 5.5 10*9/L (ref 4.5–11.0)

## 2019-10-14 LAB — PHOSPHORUS: Phosphate:MCnc:Pt:Ser/Plas:Qn:: 2.7 — ABNORMAL LOW

## 2019-10-14 LAB — BASOPHILS ABSOLUTE COUNT: Basophils:NCnc:Pt:Bld:Qn:Automated count: 0

## 2019-10-14 LAB — MAGNESIUM: Magnesium:MCnc:Pt:Ser/Plas:Qn:: 1.5 — ABNORMAL LOW

## 2019-10-14 LAB — TACROLIMUS LEVEL, TROUGH: TACROLIMUS, TROUGH: 7.3 ng/mL (ref 5.0–15.0)

## 2019-10-14 LAB — TACROLIMUS, TROUGH: Lab: 7.3

## 2019-10-14 LAB — POTASSIUM: Potassium:SCnc:Pt:Ser/Plas:Qn:: 4.6

## 2019-10-15 LAB — CMV DNA, QUANTITATIVE, PCR: CMV QUANT: 53 [IU]/mL — ABNORMAL HIGH (ref <0)

## 2019-10-15 LAB — CMV QUANT LOG10: Lab: 1.72 — ABNORMAL HIGH

## 2019-10-15 NOTE — Unmapped (Signed)
After reviewing patient most recent labs from 11-09 with Pharm D Wallace Cullens Mincemoyer, no changes or interventions at this time.   patient at tac goal 6-8   CMV is pending at this time

## 2019-10-17 MED FILL — MYFORTIC 180 MG TABLET,DELAYED RELEASE: ORAL | 30 days supply | Qty: 180 | Fill #4

## 2019-10-17 MED FILL — ALLOPURINOL 100 MG TABLET: 30 days supply | Qty: 30 | Fill #3 | Status: AC

## 2019-10-17 MED FILL — ALLOPURINOL 100 MG TABLET: ORAL | 30 days supply | Qty: 30 | Fill #3

## 2019-10-17 MED FILL — MYFORTIC 180 MG TABLET,DELAYED RELEASE: 30 days supply | Qty: 180 | Fill #4 | Status: AC

## 2019-10-17 MED FILL — OMEPRAZOLE 20 MG CAPSULE,DELAYED RELEASE: 30 days supply | Qty: 15 | Fill #0 | Status: AC

## 2019-10-17 MED FILL — DAPSONE 100 MG TABLET: 30 days supply | Qty: 30 | Fill #3 | Status: AC

## 2019-10-17 MED FILL — DAPSONE 100 MG TABLET: ORAL | 30 days supply | Qty: 30 | Fill #3

## 2019-10-17 MED FILL — MG-PLUS-PROTEIN 133 MG TABLET: ORAL | 33 days supply | Qty: 200 | Fill #4

## 2019-10-17 MED FILL — MG-PLUS-PROTEIN 133 MG TABLET: 33 days supply | Qty: 200 | Fill #4 | Status: AC

## 2019-10-21 ENCOUNTER — Ambulatory Visit: Admit: 2019-10-21 | Discharge: 2019-10-22 | Payer: MEDICARE

## 2019-10-21 LAB — CBC W/ AUTO DIFF
BASOPHILS ABSOLUTE COUNT: 0 10*9/L (ref 0.0–0.1)
EOSINOPHILS ABSOLUTE COUNT: 0.4 10*9/L (ref 0.0–0.4)
HEMATOCRIT: 31.5 % — ABNORMAL LOW (ref 41.0–53.0)
HEMOGLOBIN: 10.2 g/dL — ABNORMAL LOW (ref 13.5–17.5)
LYMPHOCYTES ABSOLUTE COUNT: 0.2 10*9/L — ABNORMAL LOW (ref 1.5–5.0)
LYMPHOCYTES RELATIVE PERCENT: 6.3 %
MEAN CORPUSCULAR HEMOGLOBIN CONC: 32.4 g/dL (ref 31.0–37.0)
MEAN CORPUSCULAR HEMOGLOBIN: 33.8 pg (ref 26.0–34.0)
MEAN CORPUSCULAR VOLUME: 104.2 fL — ABNORMAL HIGH (ref 80.0–100.0)
MEAN PLATELET VOLUME: 7 fL (ref 7.0–10.0)
MONOCYTES ABSOLUTE COUNT: 0 10*9/L — ABNORMAL LOW (ref 0.2–0.8)
MONOCYTES RELATIVE PERCENT: 1.2 %
NEUTROPHILS ABSOLUTE COUNT: 2.9 10*9/L (ref 2.0–7.5)
NEUTROPHILS RELATIVE PERCENT: 80.8 %
PLATELET COUNT: 246 10*9/L (ref 150–440)
RED BLOOD CELL COUNT: 3.02 10*12/L — ABNORMAL LOW (ref 4.50–5.90)
WBC ADJUSTED: 3.6 10*9/L — ABNORMAL LOW (ref 4.5–11.0)

## 2019-10-21 LAB — BASIC METABOLIC PANEL
ANION GAP: 9 mmol/L (ref 7–15)
BUN / CREAT RATIO: 23
CHLORIDE: 105 mmol/L (ref 98–107)
CO2: 25 mmol/L (ref 22.0–30.0)
CREATININE: 0.79 mg/dL (ref 0.70–1.30)
EGFR CKD-EPI AA MALE: >90 mL/min/{1.73_m2} (ref >=60)
EGFR CKD-EPI NON-AA MALE: >90 mL/min/{1.73_m2} (ref >=60)
GLUCOSE RANDOM: 118 mg/dL (ref 70–179)
POTASSIUM: 5.2 mmol/L — ABNORMAL HIGH (ref 3.5–5.0)
SODIUM: 139 mmol/L (ref 135–145)

## 2019-10-21 LAB — MAGNESIUM: Magnesium:MCnc:Pt:Ser/Plas:Qn:: 1.6

## 2019-10-21 LAB — PHOSPHORUS
PHOSPHORUS: 2.8 mg/dL — ABNORMAL LOW (ref 2.9–4.7)
Phosphate:MCnc:Pt:Ser/Plas:Qn:: 2.8 — ABNORMAL LOW

## 2019-10-21 LAB — CALCIUM: Calcium:MCnc:Pt:Ser/Plas:Qn:: 10.6 — ABNORMAL HIGH

## 2019-10-21 LAB — SMEAR REVIEW

## 2019-10-21 LAB — MACROCYTES

## 2019-10-21 LAB — TACROLIMUS, TROUGH: Lab: 7.8

## 2019-10-22 ENCOUNTER — Ambulatory Visit: Admit: 2019-10-22 | Discharge: 2019-10-23 | Payer: MEDICARE

## 2019-10-22 DIAGNOSIS — N2581 Secondary hyperparathyroidism of renal origin: Principal | ICD-10-CM

## 2019-10-22 DIAGNOSIS — E215 Disorder of parathyroid gland, unspecified: Principal | ICD-10-CM

## 2019-10-22 DIAGNOSIS — Z940 Kidney transplant status: Principal | ICD-10-CM

## 2019-10-22 DIAGNOSIS — N186 End stage renal disease: Principal | ICD-10-CM

## 2019-10-22 DIAGNOSIS — Z1159 Encounter for screening for other viral diseases: Principal | ICD-10-CM

## 2019-10-22 DIAGNOSIS — Z79899 Other long term (current) drug therapy: Principal | ICD-10-CM

## 2019-10-22 DIAGNOSIS — Z114 Encounter for screening for human immunodeficiency virus [HIV]: Principal | ICD-10-CM

## 2019-10-22 LAB — PARATHYROID HORMONE INTACT: Parathyrin.intact:MCnc:Pt:Ser/Plas:Qn:: 144.7 — ABNORMAL HIGH

## 2019-10-22 LAB — ALBUMIN
ALBUMIN: 4.3 g/dL (ref 3.5–5.0)
Albumin:MCnc:Pt:Ser/Plas:Qn:: 4.3

## 2019-10-22 LAB — PTH: CALCIUM: 10.4 mg/dL — ABNORMAL HIGH (ref 8.5–10.2)

## 2019-10-22 NOTE — Unmapped (Signed)
REFERRING PHYSICIAN:  Leeroy Bock, MD  [address]  [place],  [place] [address]    CONSULTING PHYSICIANS:  Patient Care Team:  Keri Rosita Fire, FNP as PCP - General (Family Medicine)  Keri Rosita Fire, FNP as PCP - Rande Brunt  Mosetta Pigeon, MD as Attending Provider  Thomasene Mohair, MSW as Social Worker  Mosetta Pigeon, MD as Referring Physician (Nephrology)  Lindi Adie, RN as Transplant Coordinator (Transplant Nephrology)  Leeroy Bock, MD as Transplant Nephrologist (Nephrology)  Johny Chess, MD (Surgical Oncology)    PRIMARY CARE PROVIDER:  Noralyn Pick, FNP      REFERRING PHYSICIAN:  Leeroy Bock, MD  [address]  [place],  [place] [address]    CONSULTING PHYSICIANS:  Patient Care Team:  Keri Rosita Fire, FNP as PCP - General (Family Medicine)  Keri Rosita Fire, FNP as PCP - Rande Brunt  Mosetta Pigeon, MD as Attending Provider  Thomasene Mohair, MSW as Social Worker  Mosetta Pigeon, MD as Referring Physician (Nephrology)  Lindi Adie, RN as Transplant Coordinator (Transplant Nephrology)  Leeroy Bock, MD as Transplant Nephrologist (Nephrology)  Johny Chess, MD (Surgical Oncology)    PRIMARY CARE PROVIDER:  Noralyn Pick, FNP      History and Physical    Assessment/Plan:     Impression:  Secondary hyperparathyroidism    Plan:  The natural course and history was reviewed with the patient.  We discussed our recommendation for surgery.  Risks and benefits of a parathyroidectomy with possible autotransplantation were reviewed at length.  Handouts were provided.  He wishes to proceed.   We will tentatively plan for surgery on 11/11/2019.  Consent obtained and orders placed into Epic.  He understands that he will require preoperative COVID 19 testing.       History of Present Illness:     Chief Complaint:  Hyperparathyroidism Louis Dennis is a 58 y.o. yo male who is seen in consultation for secondary hyperparathyroidism.  The patient speaks very good [place].  However, it was clear that he did not have a good understanding of what was being communicated, so his visit was performed with a telephonic interpreter in [place].  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  The etiology of his kidney failure is unknown, though he reports that his mother and sister both experienced kidney failure in their 10s.  Prior to transplant, he underwent peritoneal dialysis followed by hemodialysis. He has been taking Sensipar with good control of his calcium, though he notes if he misses a dose, his calcium does elevated. He has not noticed any symptoms.  He reports no difficulty with recovery from his transplant. He has a previously noted left thyroid nodule for which he underwent FNA on 05/13/2019 which showed a non-neoplastic thyroid lesion.  He is asymptomatic from this standpoint.  No specific complaints today.     There is no history of radiation exposure and no family history of any endocrine tumors.      Allergies    No Known Allergies      Medications        Current Outpatient Medications:   ???  allopurinoL (ZYLOPRIM) 100 MG tablet, Take 1 tablet (100 mg total) by mouth daily., Disp: 30 tablet, Rfl: 11  ???  aspirin (ECOTRIN) 81 MG tablet, Take 1 tablet (81 mg total) by mouth daily., Disp: 30 tablet, Rfl: 11  ???  biotin 5 mg cap, Take 1 capsule (5,000  mcg total) by mouth daily., Disp:  , Rfl: 0  ???  cinacalcet (SENSIPAR) 60 MG tablet, Take 3 tablets (180 mg total) by mouth daily., Disp: 90 tablet, Rfl: 11  ???  dapsone 100 MG tablet, Take 1 tablet (100 mg total) by mouth daily., Disp: 30 tablet, Rfl: 4  ???  magnesium oxide-Mg AA chelate (MAGNESIUM, AMINO ACID CHELATE,) 133 mg Tab, Take 3 tablets by mouth Two (2) times a day., Disp: 200 tablet, Rfl: 5 ???  metoprolol tartrate (LOPRESSOR) 25 MG tablet, Take one-half tablet (12.5 mg total) by mouth Two (2) times a day., Disp: 30 tablet, Rfl: 11  ???  MYFORTIC 180 mg EC tablet, Take 3 tablets (540 mg total) by mouth Two (2) times a day., Disp: 180 tablet, Rfl: 11  ???  omeprazole (PRILOSEC) 20 MG capsule, Take 1 capsule (20 mg total) by mouth every other day., Disp: 15 capsule, Rfl: 2  ???  tacrolimus (ENVARSUS XR) 1 mg Tb24 extended release tablet, Take 3 tablets (3 mg total) by mouth daily., Disp: 90 tablet, Rfl: 11  ???  valGANciclovir (VALCYTE) 450 mg tablet, Take 2 tablets (900 mg total) by mouth Two (2) times a day., Disp: 120 tablet, Rfl: 1  ???  acetaminophen (TYLENOL) 500 MG tablet, Take 1-2 tablets (500-1,000 mg total) by mouth every six (6) hours as needed for pain. (Patient not taking: Reported on 10/22/2019), Disp: 100 tablet, Rfl: 0      Past Medical History    Past Medical History:   Diagnosis Date   ??? Abnormal thyroid function test    ??? End stage renal disease (CMS-HCC)     now on peritoneal dialysis   ??? Gout     reports was in Right hand, foot and left elbow   ??? Hypertension     takes meds intermitttently based on readings   ??? Nephrolithiasis 2014         Past Surgical History    Past Surgical History:   Procedure Laterality Date   ??? OTHER SURGICAL HISTORY Right 08/2017    Pt had catheter moved from left side to right side.   ??? pd catheter  2012   ??? PR COLONOSCOPY W/BIOPSY SINGLE/MULTIPLE N/A 06/15/2018    Procedure: COLONOSCOPY, FLEXIBLE, PROXIMAL TO SPLENIC FLEXURE; WITH BIOPSY, SINGLE OR MULTIPLE;  Surgeon: Charm Rings, MD;  Location: [facility];  Service: Gastroenterology   ??? PR COLSC FLX W/RMVL OF TUMOR POLYP LESION SNARE TQ N/A 06/15/2018    Procedure: COLONOSCOPY FLEX; W/REMOV TUMOR/LES BY SNARE;  Surgeon: Charm Rings, MD;  Location: [facility];  Service: Gastroenterology   ??? PR EXPLORATORY OF ABDOMEN N/A 11/22/2018 Procedure: EXPLORATORY LAPAROTOMY, EXPLORATORY CELIOTOMY WITH OR WITHOUT BIOPSY(S);  Surgeon: Lawrence Marseilles Day Thurnell Lose, MD;  Location: MAIN OR [facility];  Service: Trauma   ??? PR FREEING BOWEL ADHESION,ENTEROLYSIS N/A 11/22/2018    Procedure: Enterolysis (Separt Proc);  Surgeon: Lawrence Marseilles Day Thurnell Lose, MD;  Location: MAIN OR [facility];  Service: Trauma   ??? PR REMOVE PERITONEAL FOREIGN BODY N/A 11/22/2018    Procedure: Removal Of Peritoneal Of Foreign Body From Peritoneal Cavity;  Surgeon: Lawrence Marseilles Day Thurnell Lose, MD;  Location: MAIN OR [facility];  Service: Trauma   ??? PR TRANSPLANT,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    Procedure: 21 [facility] STD PREP CAD DONR RENAL ALLOGFT PRIOR TO TRNSPLNT, INCL DISSEC/REM PERINEPH FAT, DIAPH/RTPER ATTAC;  Surgeon: Doyce Loose, MD;  Location: MAIN OR [facility];  Service: Transplant   ???  PR TRANSPLANTATION OF KIDNEY N/A 05/24/2019    Procedure: RENAL ALLOTRANSPLANTATION, IMPLANTATION OF GRAFT; WITHOUT RECIPIENT NEPHRECTOMY;  Surgeon: Doyce Loose, MD;  Location: MAIN OR [facility];  Service: Transplant         Family History    Family History   Problem Relation Age of Onset   ??? Hypertension Mother    ??? Kidney disease Mother    ??? Diabetes Mother    ??? Hypertension Father    ??? Kidney disease Sister         s/p transplant   ??? Hypertension Brother    ??? Glaucoma Neg Hx    ??? Amblyopia Neg Hx    ??? Blindness Neg Hx    ??? Retinal detachment Neg Hx    ??? Strabismus Neg Hx    ??? Macular degeneration Neg Hx    ??? Basal cell carcinoma Neg Hx    ??? Melanoma Neg Hx    ??? Squamous cell carcinoma Neg Hx        Social History:      Tobacco use:   Social History     Tobacco Use   Smoking Status Former Smoker   ??? Packs/day: 1.00   ??? Years: 30.00   ??? Pack years: 30.00   ??? Types: Cigarettes   ??? Quit date: 07/10/2009   ??? Years since quitting: 10.2   Smokeless Tobacco Never Used     Alcohol use:   Social History     Substance and Sexual Activity   Alcohol Use No   ??? Alcohol/week: 0.0 standard drinks     Drug use: Social History     Substance and Sexual Activity   Drug Use No           Review of Systems    A 12 system review of systems was negative except as noted in HPI    Objective      Vital Signs    Vitals:    10/22/19 1422   BP: 136/71   Pulse: 68   Temp: 36.6 ??C (97.9 ??F)   SpO2: 100%         Physical Exam    General Appearance:  No acute distress, well appearing and well nourished. Hair and skin normal in texture and character for age.  Voice normal in character.   Eyes:  Conjuctiva and lids appear normal. Pupils equal and round,   sclera anicteric.   Throat: Mouth and oropharynx clear.  Grossly normal dentition   Neck: Supple with no palpable masses.   Pulmonary:    Normal respiratory effort.  Lungs clear to auscultation  bilaterally.   Cardiovascular:  Regular rate and rhythm, no murmur noted.     Abdomen:   Soft, non-tender, without masses.    Extremities: Grossly normal neuromuscular function. No pedal edema.   Vascular: Normal pulses, DP and PT and radial.   Neurologic: Muscle strength within normal limits.  DTR's symmetric in all 4 extremities.   Psychiatric: Judgement and insight appropriate.  Oriented to person,          place,  and time. Normal affect.           Test Results    Calcium   Date Value Ref Range Status   10/21/2019 10.6 (H) 8.5 - 10.2 mg/dL Final   16/09/9603 54.0 8.5 - 10.2 mg/dL Final   98/10/9146 82.9 8.5 - 10.2 mg/dL Final   56/21/3086 57.8 8.5 - 10.2 mg/dL Final   46/96/2952 84.1 8.5 -  10.2 mg/dL Final   45/40/9811 91.4 (H) 8.5 - 10.2 mg/dL Final   78/29/5621 30.8 (H) 8.5 - 10.2 mg/dL Final   65/78/4696 29.5 (H) 8.5 - 10.2 mg/dL Final   28/41/3244 01.0 (H) 8.5 - 10.2 mg/dL Final   27/25/3664 40.3 (H) 8.5 - 10.2 mg/dL Final   47/42/5956 38.7 (H) 8.5 - 10.2 mg/dL Final   56/43/3295 18.8 (H) 8.5 - 10.2 mg/dL Final   41/66/0630 16.0 (H) 8.5 - 10.2 mg/dL Final   10/93/2355 73.2 (H) 8.5 - 10.2 mg/dL Final   20/25/4270 62.3 (H) 8.5 - 10.2 mg/dL Final   76/28/3151 76.1 (H) 8.5 - 10.2 mg/dL Final 60/73/7106 26.9 (H) 8.5 - 10.2 mg/dL Final   48/54/6270 35.0 (H) 8.5 - 10.2 mg/dL Final   09/38/1829 93.7 (H) 8.5 - 10.2 mg/dL Final   16/96/7893 81.0 (H) 8.5 - 10.2 mg/dL Final   17/51/0258 52.7 (H) 8.5 - 10.2 mg/dL Final   78/24/2353 61.4 (H) 8.5 - 10.2 mg/dL Final   43/15/4008 67.6 (HH) 8.5 - 10.2 mg/dL Final   19/50/9326 71.2 (HH) 8.5 - 10.2 mg/dL Final   45/80/9983 38.2 (H) 8.5 - 10.2 mg/dL Final   50/53/9767 34.1 (HH) 8.5 - 10.2 mg/dL Final   93/79/0240 97.3 (HH) 8.5 - 10.2 mg/dL Final   53/29/9242 68.3 (HH) 8.5 - 10.2 mg/dL Final   41/96/2229 79.8 (HH) 8.5 - 10.2 mg/dL Final   92/10/9416 40.8 (HH) 8.5 - 10.2 mg/dL Final   14/48/1856 31.4 (HH) 8.5 - 10.2 mg/dL Final   97/01/6377 58.8 (HH) 8.5 - 10.2 mg/dL Final   50/27/7412 87.8 (HH) 8.5 - 10.2 mg/dL Final   67/67/2094 70.9 (HH) 8.5 - 10.2 mg/dL Final   62/83/6629 47.6 (HH) 8.5 - 10.2 mg/dL Final   54/65/0354 65.6 (HH) 8.5 - 10.2 mg/dL Final   81/27/5170 01.7 (H) 8.5 - 10.2 mg/dL Final   49/44/9675 91.6 (H) 8.5 - 10.2 mg/dL Final   38/46/6599 35.7 (H) 8.5 - 10.2 mg/dL Final   01/77/9390 9.2 8.5 - 10.2 mg/dL Final   30/08/2329 8.5 8.5 - 10.2 mg/dL Final   07/62/2633 9.2 8.5 - 10.2 mg/dL Final   35/45/6256 8.9 8.5 - 10.2 mg/dL Final   38/93/7342 9.9 8.5 - 10.2 mg/dL Final   87/68/1157 9.2 8.5 - 10.2 mg/dL Final   26/20/3559 8.4 (L) 8.5 - 10.2 mg/dL Final   74/16/3845 8.5 8.5 - 10.2 mg/dL Final   36/46/8032 8.1 (L) 8.5 - 10.2 mg/dL Final   11/27/8249 8.4 (L) 8.5 - 10.2 mg/dL Final   03/70/4888 8.7 8.5 - 10.2 mg/dL Final   91/69/4503 9.2 8.5 - 10.2 mg/dL Final   88/82/8003 9.1 8.5 - 10.2 mg/dL Final   49/17/9150 9.0 8.5 - 10.2 mg/dL Final   56/97/9480 9.4 8.5 - 10.2 mg/dL Final   16/55/3748 9.0 8.5 - 10.2 mg/dL Final   27/06/8674 7.6 (L) 8.5 - 10.2 mg/dL Final   44/92/0100 9.0 8.5 - 10.2 MG/DL Final     PTH   Date Value Ref Range Status   06/24/2019 242.6 (H) 12.0 - 72.0 pg/mL Final         Previous Imaging: thyroid ultrasound, Sestamibi scan Outside records:  All available lab, imaging results and notes were reviewed.    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  Please see attached images by Dr. Truitt Merle

## 2019-10-22 NOTE — Unmapped (Signed)
REFERRING PHYSICIAN:  Leeroy Bock, MD  [address]  [place],  [place] [address]    CONSULTING PHYSICIANS:  Patient Care Team:  Keri Rosita Fire, FNP as PCP - General (Family Medicine)  Keri Rosita Fire, FNP as PCP - Rande Brunt  Mosetta Pigeon, MD as Attending Provider  Thomasene Mohair, MSW as Social Worker  Mosetta Pigeon, MD as Referring Physician (Nephrology)  Lindi Adie, RN as Transplant Coordinator (Transplant Nephrology)  Leeroy Bock, MD as Transplant Nephrologist (Nephrology)  Johny Chess, MD (Surgical Oncology)    PRIMARY CARE PROVIDER:  Noralyn Pick, FNP      REFERRING PHYSICIAN:  Leeroy Bock, MD  [address]  [place],  [place] [address]    CONSULTING PHYSICIANS:  Patient Care Team:  Keri Rosita Fire, FNP as PCP - General (Family Medicine)  Keri Rosita Fire, FNP as PCP - Rande Brunt  Mosetta Pigeon, MD as Attending Provider  Thomasene Mohair, MSW as Social Worker  Mosetta Pigeon, MD as Referring Physician (Nephrology)  Lindi Adie, RN as Transplant Coordinator (Transplant Nephrology)  Leeroy Bock, MD as Transplant Nephrologist (Nephrology)  Johny Chess, MD (Surgical Oncology)    PRIMARY CARE PROVIDER:  Noralyn Pick, FNP      History and Physical    Assessment/Plan:     Impression:  Secondary hyperparathyroidism    Plan:  The natural course and history was reviewed with the patient.  We discussed our recommendation for surgery.  Risks and benefits of a parathyroidectomy with possible autotransplantation were reviewed at length.  Handouts were provided.  He wishes to proceed.   We will tentatively plan for surgery on 11/11/2019.  Consent obtained and orders placed into Epic.  He understands that he will require preoperative COVID 19 testing.       History of Present Illness:     Chief Complaint:  Hyperparathyroidism Kieth Hartis is a  58 y.o. yo male who is seen in consultation for secondary hyperparathyroidism.  The patient speaks very good [place].  However, it was clear that he did not have a good understanding of what was being communicated, so his visit was performed with a telephonic interpreter in [place].  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  The etiology of his kidney failure is unknown, though he reports that his mother and sister both experienced kidney failure in their 10s.  Prior to transplant, he underwent peritoneal dialysis followed by hemodialysis. He has been taking Sensipar with good control of his calcium, though he notes if he misses a dose, his calcium does elevated. He has not noticed any symptoms.  He reports no difficulty with recovery from his transplant. He has a previously noted left thyroid nodule for which he underwent FNA on 05/13/2019 which showed a non-neoplastic thyroid lesion.  He is asymptomatic from this standpoint.  No specific complaints today.     There is no history of radiation exposure and no family history of any endocrine tumors.      Allergies    No Known Allergies      Medications        Current Outpatient Medications:   ???  allopurinoL (ZYLOPRIM) 100 MG tablet, Take 1 tablet (100 mg total) by mouth daily., Disp: 30 tablet, Rfl: 11  ???  aspirin (ECOTRIN) 81 MG tablet, Take 1 tablet (81 mg total) by mouth daily., Disp: 30 tablet, Rfl: 11  ???  biotin 5 mg cap, Take 1 capsule (5,000  mcg total) by mouth daily., Disp:  , Rfl: 0  ???  cinacalcet (SENSIPAR) 60 MG tablet, Take 3 tablets (180 mg total) by mouth daily., Disp: 90 tablet, Rfl: 11  ???  dapsone 100 MG tablet, Take 1 tablet (100 mg total) by mouth daily., Disp: 30 tablet, Rfl: 4  ???  magnesium oxide-Mg AA chelate (MAGNESIUM, AMINO ACID CHELATE,) 133 mg Tab, Take 3 tablets by mouth Two (2) times a day., Disp: 200 tablet, Rfl: 5 ???  metoprolol tartrate (LOPRESSOR) 25 MG tablet, Take one-half tablet (12.5 mg total) by mouth Two (2) times a day., Disp: 30 tablet, Rfl: 11  ???  MYFORTIC 180 mg EC tablet, Take 3 tablets (540 mg total) by mouth Two (2) times a day., Disp: 180 tablet, Rfl: 11  ???  omeprazole (PRILOSEC) 20 MG capsule, Take 1 capsule (20 mg total) by mouth every other day., Disp: 15 capsule, Rfl: 2  ???  tacrolimus (ENVARSUS XR) 1 mg Tb24 extended release tablet, Take 3 tablets (3 mg total) by mouth daily., Disp: 90 tablet, Rfl: 11  ???  valGANciclovir (VALCYTE) 450 mg tablet, Take 2 tablets (900 mg total) by mouth Two (2) times a day., Disp: 120 tablet, Rfl: 1  ???  acetaminophen (TYLENOL) 500 MG tablet, Take 1-2 tablets (500-1,000 mg total) by mouth every six (6) hours as needed for pain. (Patient not taking: Reported on 10/22/2019), Disp: 100 tablet, Rfl: 0      Past Medical History    Past Medical History:   Diagnosis Date   ??? Abnormal thyroid function test    ??? End stage renal disease (CMS-HCC)     now on peritoneal dialysis   ??? Gout     reports was in Right hand, foot and left elbow   ??? Hypertension     takes meds intermitttently based on readings   ??? Nephrolithiasis 2014         Past Surgical History    Past Surgical History:   Procedure Laterality Date   ??? OTHER SURGICAL HISTORY Right 08/2017    Pt had catheter moved from left side to right side.   ??? pd catheter  2012   ??? PR COLONOSCOPY W/BIOPSY SINGLE/MULTIPLE N/A 06/15/2018    Procedure: COLONOSCOPY, FLEXIBLE, PROXIMAL TO SPLENIC FLEXURE; WITH BIOPSY, SINGLE OR MULTIPLE;  Surgeon: Charm Rings, MD;  Location: [facility];  Service: Gastroenterology   ??? PR COLSC FLX W/RMVL OF TUMOR POLYP LESION SNARE TQ N/A 06/15/2018    Procedure: COLONOSCOPY FLEX; W/REMOV TUMOR/LES BY SNARE;  Surgeon: Charm Rings, MD;  Location: [facility];  Service: Gastroenterology   ??? PR EXPLORATORY OF ABDOMEN N/A 11/22/2018 Procedure: EXPLORATORY LAPAROTOMY, EXPLORATORY CELIOTOMY WITH OR WITHOUT BIOPSY(S);  Surgeon: Lawrence Marseilles Day Thurnell Lose, MD;  Location: MAIN OR [facility];  Service: Trauma   ??? PR FREEING BOWEL ADHESION,ENTEROLYSIS N/A 11/22/2018    Procedure: Enterolysis (Separt Proc);  Surgeon: Lawrence Marseilles Day Thurnell Lose, MD;  Location: MAIN OR [facility];  Service: Trauma   ??? PR REMOVE PERITONEAL FOREIGN BODY N/A 11/22/2018    Procedure: Removal Of Peritoneal Of Foreign Body From Peritoneal Cavity;  Surgeon: Lawrence Marseilles Day Thurnell Lose, MD;  Location: MAIN OR [facility];  Service: Trauma   ??? PR TRANSPLANT,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    Procedure: [facility] STD PREP CAD DONR RENAL ALLOGFT PRIOR TO TRNSPLNT, INCL DISSEC/REM PERINEPH FAT, DIAPH/RTPER ATTAC;  Surgeon: Doyce Loose, MD;  Location: MAIN OR [facility];  Service: Transplant   ???  PR TRANSPLANTATION OF KIDNEY N/A 05/24/2019    Procedure: RENAL ALLOTRANSPLANTATION, IMPLANTATION OF GRAFT; WITHOUT RECIPIENT NEPHRECTOMY;  Surgeon: Doyce Loose, MD;  Location: MAIN OR [facility];  Service: Transplant         Family History    Family History   Problem Relation Age of Onset   ??? Hypertension Mother    ??? Kidney disease Mother    ??? Diabetes Mother    ??? Hypertension Father    ??? Kidney disease Sister         s/p transplant   ??? Hypertension Brother    ??? Glaucoma Neg Hx    ??? Amblyopia Neg Hx    ??? Blindness Neg Hx    ??? Retinal detachment Neg Hx    ??? Strabismus Neg Hx    ??? Macular degeneration Neg Hx    ??? Basal cell carcinoma Neg Hx    ??? Melanoma Neg Hx    ??? Squamous cell carcinoma Neg Hx        Social History:      Tobacco use:   Social History     Tobacco Use   Smoking Status Former Smoker   ??? Packs/day: 1.00   ??? Years: 30.00   ??? Pack years: 30.00   ??? Types: Cigarettes   ??? Quit date: 07/10/2009   ??? Years since quitting: 10.2   Smokeless Tobacco Never Used     Alcohol use:   Social History     Substance and Sexual Activity   Alcohol Use No   ??? Alcohol/week: 0.0 standard drinks     Drug use: Social History     Substance and Sexual Activity   Drug Use No           Review of Systems    A 12 system review of systems was negative except as noted in HPI    Objective      Vital Signs    Vitals:    10/22/19 1422   BP: 136/71   Pulse: 68   Temp: 36.6 ??C (97.9 ??F)   SpO2: 100%         Physical Exam    General Appearance:  No acute distress, well appearing and well nourished. Hair and skin normal in texture and character for age.  Voice normal in character.   Eyes:  Conjuctiva and lids appear normal. Pupils equal and round,   sclera anicteric.   Throat: Mouth and oropharynx clear.  Grossly normal dentition   Neck: Supple with no palpable masses.   Pulmonary:    Normal respiratory effort.  Lungs clear to auscultation  bilaterally.   Cardiovascular:  Regular rate and rhythm, no murmur noted.     Abdomen:   Soft, non-tender, without masses.    Extremities: Grossly normal neuromuscular function. No pedal edema.   Vascular: Normal pulses, DP and PT and radial.   Neurologic: Muscle strength within normal limits.  DTR's symmetric in all 4 extremities.   Psychiatric: Judgement and insight appropriate.  Oriented to person,          place,  and time. Normal affect.           Test Results    Calcium   Date Value Ref Range Status   10/21/2019 10.6 (H) 8.5 - 10.2 mg/dL Final   16/09/9603 54.0 8.5 - 10.2 mg/dL Final   98/10/9146 82.9 8.5 - 10.2 mg/dL Final   56/21/3086 57.8 8.5 - 10.2 mg/dL Final   46/96/2952 84.1 8.5 -  10.2 mg/dL Final   45/40/9811 91.4 (H) 8.5 - 10.2 mg/dL Final   78/29/5621 30.8 (H) 8.5 - 10.2 mg/dL Final   65/78/4696 29.5 (H) 8.5 - 10.2 mg/dL Final   28/41/3244 01.0 (H) 8.5 - 10.2 mg/dL Final   27/25/3664 40.3 (H) 8.5 - 10.2 mg/dL Final   47/42/5956 38.7 (H) 8.5 - 10.2 mg/dL Final   56/43/3295 18.8 (H) 8.5 - 10.2 mg/dL Final   41/66/0630 16.0 (H) 8.5 - 10.2 mg/dL Final   10/93/2355 73.2 (H) 8.5 - 10.2 mg/dL Final   20/25/4270 62.3 (H) 8.5 - 10.2 mg/dL Final   76/28/3151 76.1 (H) 8.5 - 10.2 mg/dL Final 60/73/7106 26.9 (H) 8.5 - 10.2 mg/dL Final   48/54/6270 35.0 (H) 8.5 - 10.2 mg/dL Final   09/38/1829 93.7 (H) 8.5 - 10.2 mg/dL Final   16/96/7893 81.0 (H) 8.5 - 10.2 mg/dL Final   17/51/0258 52.7 (H) 8.5 - 10.2 mg/dL Final   78/24/2353 61.4 (H) 8.5 - 10.2 mg/dL Final   43/15/4008 67.6 (HH) 8.5 - 10.2 mg/dL Final   19/50/9326 71.2 (HH) 8.5 - 10.2 mg/dL Final   45/80/9983 38.2 (H) 8.5 - 10.2 mg/dL Final   50/53/9767 34.1 (HH) 8.5 - 10.2 mg/dL Final   93/79/0240 97.3 (HH) 8.5 - 10.2 mg/dL Final   53/29/9242 68.3 (HH) 8.5 - 10.2 mg/dL Final   41/96/2229 79.8 (HH) 8.5 - 10.2 mg/dL Final   92/10/9416 40.8 (HH) 8.5 - 10.2 mg/dL Final   14/48/1856 31.4 (HH) 8.5 - 10.2 mg/dL Final   97/01/6377 58.8 (HH) 8.5 - 10.2 mg/dL Final   50/27/7412 87.8 (HH) 8.5 - 10.2 mg/dL Final   67/67/2094 70.9 (HH) 8.5 - 10.2 mg/dL Final   62/83/6629 47.6 (HH) 8.5 - 10.2 mg/dL Final   54/65/0354 65.6 (HH) 8.5 - 10.2 mg/dL Final   81/27/5170 01.7 (H) 8.5 - 10.2 mg/dL Final   49/44/9675 91.6 (H) 8.5 - 10.2 mg/dL Final   38/46/6599 35.7 (H) 8.5 - 10.2 mg/dL Final   01/77/9390 9.2 8.5 - 10.2 mg/dL Final   30/08/2329 8.5 8.5 - 10.2 mg/dL Final   07/62/2633 9.2 8.5 - 10.2 mg/dL Final   35/45/6256 8.9 8.5 - 10.2 mg/dL Final   38/93/7342 9.9 8.5 - 10.2 mg/dL Final   87/68/1157 9.2 8.5 - 10.2 mg/dL Final   26/20/3559 8.4 (L) 8.5 - 10.2 mg/dL Final   74/16/3845 8.5 8.5 - 10.2 mg/dL Final   36/46/8032 8.1 (L) 8.5 - 10.2 mg/dL Final   11/27/8249 8.4 (L) 8.5 - 10.2 mg/dL Final   03/70/4888 8.7 8.5 - 10.2 mg/dL Final   91/69/4503 9.2 8.5 - 10.2 mg/dL Final   88/82/8003 9.1 8.5 - 10.2 mg/dL Final   49/17/9150 9.0 8.5 - 10.2 mg/dL Final   56/97/9480 9.4 8.5 - 10.2 mg/dL Final   16/55/3748 9.0 8.5 - 10.2 mg/dL Final   27/06/8674 7.6 (L) 8.5 - 10.2 mg/dL Final   44/92/0100 9.0 8.5 - 10.2 MG/DL Final     PTH   Date Value Ref Range Status   06/24/2019 242.6 (H) 12.0 - 72.0 pg/mL Final         Previous Imaging: thyroid ultrasound, Sestamibi scan Outside records:  All available lab, imaging results and notes were reviewed.    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  Please see attached images by Dr. Truitt Merle

## 2019-10-22 NOTE — Unmapped (Signed)
Louis Dennis is seen in conjunction with Ms. Harrell, ANP.  Please see her note for other details.  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    A brief ultrasound exam was performed in clinic to look for abnormal parathyroids.  The thyroid is noted to be normal in size and echotexture.  3 parathyroid glands are tentatively identified, the right inferior, and both left-sided glands.  The right superior gland could not be convincingly identified.  The left superior lesion identified as parathyroid may actually be a thyroid nodule.    Impression: Secondary hyperparathyroidism due to chronic renal failure, now causing hypercalcemia post transplant.    Plan: Total parathyroidectomy with autotransplantation versus subtotal parathyroidectomy.  Today I discussed risks benefits and alternatives with the patient with the assistance of a remote [place] translator and gave him printed information regarding the risks of surgery. He understands all of this and desires to proceed as outlined.

## 2019-10-23 ENCOUNTER — Telehealth: Admit: 2019-10-23 | Discharge: 2019-10-24 | Payer: MEDICARE

## 2019-10-23 LAB — VITAMIN D, TOTAL (25OH): Lab: 24.6

## 2019-10-23 LAB — HEPATITIS B SURFACE ANTIGEN: Hepatitis B virus surface Ag:PrThr:Pt:Ser:Ord:: NONREACTIVE

## 2019-10-23 LAB — HIV ANTIGEN/ANTIBODY COMBO
HIV 1+2 Ab+HIV1 p24 Ag:PrThr:Pt:Ser/Plas:Ord:IA: NONREACTIVE
HIV ANTIGEN/ANTIBODY COMBO: NONREACTIVE

## 2019-10-23 LAB — HEPATITIS C ANTIBODY: Hepatitis C virus Ab:PrThr:Pt:Ser:Ord:: NONREACTIVE

## 2019-10-23 LAB — SYPHILIS RPR SCREEN: Reagin Ab:PrThr:Pt:Ser:Ord:RPR: NONREACTIVE

## 2019-10-23 LAB — HEPATITIS B SURFACE ANTIBODY: Hepatitis B virus surface Ab:PrThr:Pt:Ser:Ord:: REACTIVE — AB

## 2019-10-23 NOTE — Unmapped (Signed)
It was nice to speak with you today.    Please have a repeat thyroid ultrasound completed in June, 2021.    Follow up with me in June or July, 2021 after you ultrasound ins completed.    Please let me know if there are any questions or concerns.    Sincerely,  Alben Spittle, MD

## 2019-10-23 NOTE — Unmapped (Signed)
ENDOCRINE/DIABETES PATIENT VISIT BY PHONE/VIDEOCALL (TELEVISIT PHONE)    I spent 11 minutes on the real time audio visit with the patient. I spent an additional 5 minutes on pre- and post-visit activities.     The patient was not located and I was located within 250 yards of a hospital based location during the audio and video visit. The patient was physically located in [place] or a state in which I am permitted to provide care. The patient and/or parent/guardian understood that s/he may incur co-pays and cost sharing, and agreed to the telemedicine visit. The visit was reasonable and appropriate under the circumstances given the patient's presentation at the time.    The patient and/or parent/guardian has been advised of the potential risks and limitations of this mode of treatment (including, but not limited to, the absence of in-person examination) and has agreed to be treated using telemedicine. The patient's/patient's family's questions regarding telemedicine have been answered.    If the visit was completed in an ambulatory setting, the patient and/or parent/guardian has also been advised to contact their provider???s office for worsening conditions, and seek emergency medical treatment and/or call 911 if the patient deems either necessary.          Consent was obtained for this visit.    Total time: 16 minutes     Subjective:     History of Present Illness:  58 y.o. male called for follow up care for thyroid nodule.    Interim history notable for doing well with no complaints. He has underwent a renal transplant. He has secondary hyperparathyroidism which was recently diagnosed and is now being followed by transplant and Dr. Selena Batten with surgical oncology.    Relevant medications and doses, for conditions being followed by me:  None    Past Medical History:  Past Medical History:   Diagnosis Date   ??? Abnormal thyroid function test    ??? End stage renal disease (CMS-HCC)     now on peritoneal dialysis ??? Gout     reports was in Right hand, foot and left elbow   ??? Hypertension     takes meds intermitttently based on readings   ??? Nephrolithiasis 2014       Current Outpatient Medications   Medication Sig Dispense Refill   ??? acetaminophen (TYLENOL) 500 MG tablet Take 1-2 tablets (500-1,000 mg total) by mouth every six (6) hours as needed for pain. (Patient not taking: Reported on 10/22/2019) 100 tablet 0   ??? allopurinoL (ZYLOPRIM) 100 MG tablet Take 1 tablet (100 mg total) by mouth daily. 30 tablet 11   ??? aspirin (ECOTRIN) 81 MG tablet Take 1 tablet (81 mg total) by mouth daily. 30 tablet 11   ??? biotin 5 mg cap Take 1 capsule (5,000 mcg total) by mouth daily.  0   ??? cinacalcet (SENSIPAR) 60 MG tablet Take 3 tablets (180 mg total) by mouth daily. 90 tablet 11   ??? dapsone 100 MG tablet Take 1 tablet (100 mg total) by mouth daily. 30 tablet 4   ??? magnesium oxide-Mg AA chelate (MAGNESIUM, AMINO ACID CHELATE,) 133 mg Tab Take 3 tablets by mouth Two (2) times a day. 200 tablet 5   ??? metoprolol tartrate (LOPRESSOR) 25 MG tablet Take one-half tablet (12.5 mg total) by mouth Two (2) times a day. 30 tablet 11   ??? MYFORTIC 180 mg EC tablet Take 3 tablets (540 mg total) by mouth Two (2) times a day. 180 tablet 11   ???  omeprazole (PRILOSEC) 20 MG capsule Take 1 capsule (20 mg total) by mouth every other day. 15 capsule 2   ??? tacrolimus (ENVARSUS XR) 1 mg Tb24 extended release tablet Take 3 tablets (3 mg total) by mouth daily. 90 tablet 11   ??? valGANciclovir (VALCYTE) 450 mg tablet Take 2 tablets (900 mg total) by mouth Two (2) times a day. 120 tablet 1     No current facility-administered medications for this visit.        Allergies:  No Known Allergies    Family History:  Family History   Problem Relation Age of Onset   ??? Hypertension Mother    ??? Kidney disease Mother    ??? Diabetes Mother    ??? Hypertension Father    ??? Kidney disease Sister         s/p transplant   ??? Hypertension Brother    ??? Glaucoma Neg Hx ??? Amblyopia Neg Hx    ??? Blindness Neg Hx    ??? Retinal detachment Neg Hx    ??? Strabismus Neg Hx    ??? Macular degeneration Neg Hx    ??? Basal cell carcinoma Neg Hx    ??? Melanoma Neg Hx    ??? Squamous cell carcinoma Neg Hx         Social History:  Social History     Tobacco Use   ??? Smoking status: Former Smoker     Packs/day: 1.00     Years: 30.00     Pack years: 30.00     Types: Cigarettes     Quit date: 07/10/2009     Years since quitting: 10.2   ??? Smokeless tobacco: Never Used   Substance Use Topics   ??? Alcohol use: No     Alcohol/week: 0.0 standard drinks   ??? Drug use: No       Review of Systems:  Negative except as per HPI.      Objective:       Lab Review:  Lab Results   Component Value Date    A1C <4.0 (L) 09/02/2019       Lab Results   Component Value Date    CREATININE 0.79 10/21/2019       Lab Results   Component Value Date    TSH 0.704 05/31/2019       Results for Louis Dennis, Louis Dennis (MRN [id number]) as of 10/23/2019 09:34   Ref. Range 01/13/2014 10:02 09/12/2014 09:53 04/19/2019 08:52 05/31/2019 09:01   TSH Latest Ref Range: 0.600 - 3.300 uIU/mL 0.71 0.13 (L) 0.704 0.704   T3, Total Latest Ref Range: 1.0 - 1.7 ng/mL 1.4 1.2     Free T4 Latest Ref Range: 0.71 - 1.40 ng/dL 8.11 9.14 7.82 (H)      Fine Needle Aspiration: [id number]  Order: [id number]  Collected:  05/13/2019 10:56   Status:  Final result   Visible to patient:  Yes (MyChart)  Component    Final Diagnosis   A. Thyroid, left, fine needle aspiration  - Negative for malignancy, benign thyroid epithelium and colloid, consistent with non-neoplastic thyroid lesion               Radiology Review:  None    Other Medical Data:  None    Assessment/Plan:  1. Thyroid nodule. FNA benign. Repeat ultrasound 1 year from biopsy.  - Thyroid ultrasound due around 05/2020.    2. Hyperparathyroidism. Post renal transplant, likely tertiary hyperparathyroidism. Managed by transplant team and Dr. Selena Batten. 3. Previous  abnormal thyroid function tests. Last TSH normal. No further workup needed unless new symptoms arise.      Follow up: June 2021 after ultrasound.    Alben Spittle, MD  [facility] Department of Endocrinology

## 2019-10-24 LAB — CMV VIRAL LD: Lab: 0

## 2019-10-24 LAB — CMV DNA, QUANTITATIVE, PCR

## 2019-10-24 NOTE — Unmapped (Signed)
[facility] Refill Coordination Note    Specialty Medication(s) to be Shipped:   Transplant: Envarsus 1mg , Sensipar 60mg  and valgancyclovir 450mg     Other medication(s) to be shipped: aspirin 81mg  and metoprolol tart 25mg      [name], DOB: [birth year]  Phone: [phone] (home)       All above HIPAA information was verified with patient.     Completed refill call assessment today to schedule patient's medication shipment from the [facility] [phone]).       Specialty medication(s) and dose(s) confirmed: Regimen is correct and unchanged.   Changes to medications: Michall reports no changes at this time.  Changes to insurance: No  Questions for the pharmacist: No    Confirmed patient received Welcome Packet with first shipment. The patient will receive a drug information handout for each medication shipped and additional FDA Medication Guides as required.       DISEASE/MEDICATION-SPECIFIC INFORMATION        N/A    SPECIALTY MEDICATION ADHERENCE     Medication Adherence    Patient reported X missed doses in the last month: 0  Specialty Medication: Envarsus 1mg   Patient is on additional specialty medications: Yes  Additional Specialty Medications: Sensipar 60mg   Patient Reported Additional Medication X Missed Doses in the Last Month: 0  Patient is on more than two specialty medications: Yes  Specialty Medication: valGANciclovir 450 mg tablet (VALCYTE)  Patient Reported Additional Medication X Missed Doses in the Last Month: 0          Envarsus 1 mg: 14 days of medicine on hand   Sensipar 60 mg: 14 days of medicine on hand   Valganciclovir 450 mg: 14 days of medicine on hand     SHIPPING     Shipping address confirmed in Epic.     Delivery Scheduled: Yes, Expected medication delivery date: 11/05/2019.     Medication will be delivered via Next Day Courier to the prescription address in Epic WAM.    [name]   [facility] Specialty Technician

## 2019-10-25 LAB — HTLV I/II ANTIBODIES: Lab: NEGATIVE

## 2019-10-28 ENCOUNTER — Ambulatory Visit: Admit: 2019-10-28 | Discharge: 2019-10-29 | Payer: MEDICARE

## 2019-10-28 LAB — HEMATOCRIT: Hematocrit:VFr:Pt:Bld:Qn:: 33.4 — ABNORMAL LOW

## 2019-10-28 LAB — CALCIUM: Calcium:MCnc:Pt:Ser/Plas:Qn:: 10.4 — ABNORMAL HIGH

## 2019-10-28 LAB — CBC W/ AUTO DIFF
BASOPHILS ABSOLUTE COUNT: 0 10*9/L (ref 0.0–0.1)
BASOPHILS RELATIVE PERCENT: 1.3 %
EOSINOPHILS ABSOLUTE COUNT: 0.2 10*9/L (ref 0.0–0.4)
EOSINOPHILS RELATIVE PERCENT: 7 %
HEMATOCRIT: 33.4 % — ABNORMAL LOW (ref 41.0–53.0)
LARGE UNSTAINED CELLS: 2 % (ref 0–4)
LYMPHOCYTES ABSOLUTE COUNT: 0.3 10*9/L — ABNORMAL LOW (ref 1.5–5.0)
LYMPHOCYTES RELATIVE PERCENT: 13.3 %
MEAN CORPUSCULAR HEMOGLOBIN CONC: 31.8 g/dL (ref 31.0–37.0)
MEAN CORPUSCULAR HEMOGLOBIN: 33.5 pg (ref 26.0–34.0)
MEAN CORPUSCULAR VOLUME: 105.2 fL — ABNORMAL HIGH (ref 80.0–100.0)
MEAN PLATELET VOLUME: 7.4 fL (ref 7.0–10.0)
MONOCYTES ABSOLUTE COUNT: 0 10*9/L — ABNORMAL LOW (ref 0.2–0.8)
MONOCYTES RELATIVE PERCENT: 1.4 %
NEUTROPHILS ABSOLUTE COUNT: 1.6 10*9/L — ABNORMAL LOW (ref 2.0–7.5)
NEUTROPHILS RELATIVE PERCENT: 75 %
PLATELET COUNT: 245 10*9/L (ref 150–440)
RED BLOOD CELL COUNT: 3.18 10*12/L — ABNORMAL LOW (ref 4.50–5.90)
WBC ADJUSTED: 2.1 10*9/L — ABNORMAL LOW (ref 4.5–11.0)

## 2019-10-28 LAB — BASIC METABOLIC PANEL
ANION GAP: 8 mmol/L (ref 7–15)
BLOOD UREA NITROGEN: 16 mg/dL (ref 7–21)
BUN / CREAT RATIO: 22
CALCIUM: 10.4 mg/dL — ABNORMAL HIGH (ref 8.5–10.2)
CO2: 27 mmol/L (ref 22.0–30.0)
CREATININE: 0.74 mg/dL (ref 0.70–1.30)
EGFR CKD-EPI NON-AA MALE: >90 mL/min/{1.73_m2} (ref >=60)
GLUCOSE RANDOM: 121 mg/dL (ref 70–179)
POTASSIUM: 4.9 mmol/L (ref 3.5–5.0)

## 2019-10-28 LAB — TACROLIMUS, TROUGH: Lab: 8.5

## 2019-10-28 LAB — MAGNESIUM: Magnesium:MCnc:Pt:Ser/Plas:Qn:: 1.4 — ABNORMAL LOW

## 2019-10-28 LAB — PHOSPHORUS: Phosphate:MCnc:Pt:Ser/Plas:Qn:: 2.5 — ABNORMAL LOW

## 2019-10-30 LAB — CMV DNA, QUANTITATIVE, PCR: CMV VIRAL LD: NOT DETECTED

## 2019-10-30 LAB — CMV QUANT LOG10: Lab: 0

## 2019-11-04 ENCOUNTER — Ambulatory Visit: Admit: 2019-11-04 | Discharge: 2019-11-05 | Payer: MEDICARE

## 2019-11-04 LAB — CBC W/ AUTO DIFF
BASOPHILS ABSOLUTE COUNT: 0 10*9/L (ref 0.0–0.1)
BASOPHILS RELATIVE PERCENT: 4.4 %
EOSINOPHILS RELATIVE PERCENT: 8.5 %
HEMATOCRIT: 35.1 % — ABNORMAL LOW (ref 41.0–53.0)
HEMOGLOBIN: 11.2 g/dL — ABNORMAL LOW (ref 13.5–17.5)
LARGE UNSTAINED CELLS: 4 % (ref 0–4)
LYMPHOCYTES RELATIVE PERCENT: 23.1 %
MEAN CORPUSCULAR HEMOGLOBIN CONC: 32 g/dL (ref 31.0–37.0)
MEAN CORPUSCULAR HEMOGLOBIN: 33.6 pg (ref 26.0–34.0)
MEAN CORPUSCULAR VOLUME: 105 fL — ABNORMAL HIGH (ref 80.0–100.0)
MEAN PLATELET VOLUME: 7.4 fL (ref 7.0–10.0)
MONOCYTES ABSOLUTE COUNT: 0 10*9/L — ABNORMAL LOW (ref 0.2–0.8)
MONOCYTES RELATIVE PERCENT: 0.8 %
NEUTROPHILS ABSOLUTE COUNT: 0.5 10*9/L — ABNORMAL LOW (ref 2.0–7.5)
NEUTROPHILS RELATIVE PERCENT: 59.4 %
PLATELET COUNT: 258 10*9/L (ref 150–440)
RED BLOOD CELL COUNT: 3.34 10*12/L — ABNORMAL LOW (ref 4.50–5.90)
RED CELL DISTRIBUTION WIDTH: 16.1 % — ABNORMAL HIGH (ref 12.0–15.0)
WBC ADJUSTED: 0.9 10*9/L — ABNORMAL LOW (ref 4.5–11.0)

## 2019-11-04 LAB — BASIC METABOLIC PANEL
ANION GAP: 8 mmol/L (ref 7–15)
BLOOD UREA NITROGEN: 19 mg/dL (ref 7–21)
BUN / CREAT RATIO: 23
CHLORIDE: 107 mmol/L (ref 98–107)
CO2: 25 mmol/L (ref 22.0–30.0)
CREATININE: 0.81 mg/dL (ref 0.70–1.30)
EGFR CKD-EPI AA MALE: >90 mL/min/{1.73_m2} (ref >=60)
EGFR CKD-EPI NON-AA MALE: >90 mL/min/{1.73_m2} (ref >=60)
GLUCOSE RANDOM: 118 mg/dL (ref 70–179)
POTASSIUM: 5.2 mmol/L — ABNORMAL HIGH (ref 3.5–5.0)
SODIUM: 140 mmol/L (ref 135–145)

## 2019-11-04 LAB — PHOSPHORUS: Phosphate:MCnc:Pt:Ser/Plas:Qn:: 2.6 — ABNORMAL LOW

## 2019-11-04 LAB — CMV DNA, QUANTITATIVE, PCR: CMV QUANT LOG10: 2.17 {Log_IU}/mL — ABNORMAL HIGH (ref <0.00)

## 2019-11-04 LAB — TACROLIMUS, TROUGH: Lab: 5.8

## 2019-11-04 LAB — CMV VIRAL LD: Lab: 0

## 2019-11-04 LAB — SMEAR REVIEW

## 2019-11-04 LAB — MAGNESIUM: Magnesium:MCnc:Pt:Ser/Plas:Qn:: 1.5 — ABNORMAL LOW

## 2019-11-04 LAB — BASOPHILS RELATIVE PERCENT: Basophils/100 leukocytes:NFr:Pt:Bld:Qn:Automated count: 4.4

## 2019-11-04 LAB — CREATININE: Creatinine:MCnc:Pt:Ser/Plas:Qn:: 0.81

## 2019-11-04 MED FILL — ASPIRIN 81 MG TABLET,DELAYED RELEASE: ORAL | 30 days supply | Qty: 30 | Fill #5

## 2019-11-04 MED FILL — SENSIPAR 60 MG TABLET: ORAL | 30 days supply | Qty: 90 | Fill #4

## 2019-11-04 MED FILL — METOPROLOL TARTRATE 25 MG TABLET: ORAL | 30 days supply | Qty: 30 | Fill #2

## 2019-11-04 MED FILL — ENVARSUS XR 1 MG TABLET,EXTENDED RELEASE: ORAL | 30 days supply | Qty: 90 | Fill #2

## 2019-11-04 NOTE — Unmapped (Signed)
Reviewed WBC 0.9 with Dr. Nestor Lewandowsky, decreasing Myfortic to 360mg  BID. Pt made aware using [employer] [phone]), also reviewed earlier request to STOP Valcyte. Pt verbalized understanding.

## 2019-11-04 NOTE — Unmapped (Signed)
Reviewed most recent CMV results with L. Mincemoyer, Pharm D, pt will stop Valcyte. Pt made aware, verbalized understanding. Also reviewed upcoming appointment with Dr. Nestor Lewandowsky on 12/4. Pt made TNC aware he will have parathyroidectomy on 11/11/2019.

## 2019-11-07 DIAGNOSIS — Z940 Kidney transplant status: Principal | ICD-10-CM

## 2019-11-07 NOTE — Unmapped (Addendum)
12/7 update: new [facility] for dosage change myfortic. Per 12/4 office visit notes pt was aware of this change to 360mg  bid. New rx already added to exiting work order - this new dose is what patient will receive as set up per below note Louis Dennis        [facility] Refill Coordination Note    Specialty Medication(s) to be Shipped:   Transplant: Myfortic 180mg  and Dapsone 100mg    **sent refill request for dapsone 100mg **    Other medication(s) to be shipped: allopurinol 100mg , mag 133mg , and omperazole 20mg      Louis Dennis, DOB: [birth year]  Phone: [phone] (home)       All above HIPAA information was verified with patient.     Completed refill call assessment today to schedule patient's medication shipment from the [facility] [phone]).       Specialty medication(s) and dose(s) confirmed: Regimen is correct and unchanged.   Changes to medications: Angeldejesus reports no changes at this time.  Changes to insurance: No  Questions for the pharmacist: No    Confirmed patient received Welcome Packet with first shipment. The patient will receive a drug information handout for each medication shipped and additional FDA Medication Guides as required.       DISEASE/MEDICATION-SPECIFIC INFORMATION        N/A    SPECIALTY MEDICATION ADHERENCE     Medication Adherence    Patient reported X missed doses in the last month: 0  Specialty Medication: Myfortic 180mg   Patient is on additional specialty medications: Yes  Additional Specialty Medications: Dapsone 100mg   Patient Reported Additional Medication X Missed Doses in the Last Month: 0  Patient is on more than two specialty medications: No          Myfortic 180 mg: 14 days of medicine on hand   Dapsone 100 mg: 14 days of medicine on hand     SHIPPING     Shipping address confirmed in Epic.     Delivery Scheduled: Yes, Expected medication delivery date: 11/15/2019. Medication will be delivered via Next Day Courier to the prescription address in Epic WAM.    [name]   [facility] Specialty Technician

## 2019-11-08 ENCOUNTER — Ambulatory Visit: Admit: 2019-11-08 | Discharge: 2019-11-08 | Payer: MEDICARE

## 2019-11-08 ENCOUNTER — Ambulatory Visit: Admit: 2019-11-08 | Discharge: 2019-11-08 | Payer: MEDICARE | Attending: Nephrology | Primary: Nephrology

## 2019-11-08 DIAGNOSIS — H9313 Tinnitus, bilateral: Principal | ICD-10-CM

## 2019-11-08 DIAGNOSIS — Z940 Kidney transplant status: Principal | ICD-10-CM

## 2019-11-08 DIAGNOSIS — Z79899 Other long term (current) drug therapy: Principal | ICD-10-CM

## 2019-11-08 LAB — CBC W/ AUTO DIFF
BASOPHILS ABSOLUTE COUNT: 0.1 10*9/L (ref 0.0–0.1)
EOSINOPHILS ABSOLUTE COUNT: 0.1 10*9/L (ref 0.0–0.4)
EOSINOPHILS RELATIVE PERCENT: 9.7 %
HEMATOCRIT: 37.8 % — ABNORMAL LOW (ref 41.0–53.0)
LARGE UNSTAINED CELLS: 2 % (ref 0–4)
LYMPHOCYTES ABSOLUTE COUNT: 0.2 10*9/L — ABNORMAL LOW (ref 1.5–5.0)
LYMPHOCYTES RELATIVE PERCENT: 19.3 %
MEAN CORPUSCULAR HEMOGLOBIN CONC: 30.1 g/dL — ABNORMAL LOW (ref 31.0–37.0)
MEAN CORPUSCULAR HEMOGLOBIN: 33.5 pg (ref 26.0–34.0)
MEAN CORPUSCULAR VOLUME: 111.4 fL — ABNORMAL HIGH (ref 80.0–100.0)
MEAN PLATELET VOLUME: 8.3 fL (ref 7.0–10.0)
MONOCYTES ABSOLUTE COUNT: 0 10*9/L — ABNORMAL LOW (ref 0.2–0.8)
MONOCYTES RELATIVE PERCENT: 2.3 %
NEUTROPHILS ABSOLUTE COUNT: 0.7 10*9/L — ABNORMAL LOW (ref 2.0–7.5)
NEUTROPHILS RELATIVE PERCENT: 62 %
PLATELET COUNT: 265 10*9/L (ref 150–440)
RED BLOOD CELL COUNT: 3.39 10*12/L — ABNORMAL LOW (ref 4.50–5.90)
RED CELL DISTRIBUTION WIDTH: 16.2 % — ABNORMAL HIGH (ref 12.0–15.0)
WBC ADJUSTED: 1.1 10*9/L — ABNORMAL LOW (ref 4.5–11.0)

## 2019-11-08 LAB — PROTEIN / CREATININE RATIO, URINE: PROTEIN URINE: <4 mg/dL

## 2019-11-08 LAB — PHOSPHORUS: Phosphate:MCnc:Pt:Ser/Plas:Qn:: 2.6 — ABNORMAL LOW

## 2019-11-08 LAB — MAGNESIUM: Magnesium:MCnc:Pt:Ser/Plas:Qn:: 1.5 — ABNORMAL LOW

## 2019-11-08 LAB — BASIC METABOLIC PANEL
ANION GAP: 11 mmol/L (ref 7–15)
BUN / CREAT RATIO: 28
CALCIUM: 9.8 mg/dL (ref 8.5–10.2)
CHLORIDE: 104 mmol/L (ref 98–107)
CO2: 22 mmol/L (ref 22.0–30.0)
CREATININE: 0.8 mg/dL (ref 0.70–1.30)
EGFR CKD-EPI AA MALE: >90 mL/min/{1.73_m2} (ref >=60)
EGFR CKD-EPI NON-AA MALE: >90 mL/min/{1.73_m2} (ref >=60)
GLUCOSE RANDOM: 112 mg/dL (ref 70–179)
SODIUM: 137 mmol/L (ref 135–145)

## 2019-11-08 LAB — URINALYSIS
BACTERIA: NONE SEEN /HPF
BILIRUBIN UA: NEGATIVE
BLOOD UA: NEGATIVE
GLUCOSE UA: NEGATIVE
KETONES UA: NEGATIVE
LEUKOCYTE ESTERASE UA: NEGATIVE
PROTEIN UA: NEGATIVE
RBC UA: <1 /HPF (ref <=3)
SPECIFIC GRAVITY UA: 1.01 (ref 1.003–1.030)
SQUAMOUS EPITHELIAL: <1 /HPF (ref 0–5)
UROBILINOGEN UA: 0.2
WBC UA: <1 /HPF (ref <=2)

## 2019-11-08 LAB — SLIDE REVIEW

## 2019-11-08 LAB — TACROLIMUS, TROUGH: Lab: 8.2

## 2019-11-08 LAB — PROTEIN/CREAT RATIO, URINE: Protein/Creatinine:MRto:Pt:Urine:Qn:: 0

## 2019-11-08 LAB — EGFR CKD-EPI NON-AA MALE: Lab: >90

## 2019-11-08 LAB — RBC UA: Erythrocytes:Naric:Pt:Urine sed:Qn:Microscopy.light.HPF: <1

## 2019-11-08 LAB — SMEAR REVIEW

## 2019-11-08 LAB — MEAN PLATELET VOLUME: Platelet mean volume:EntVol:Pt:Bld:Qn:Automated count: 8.3

## 2019-11-08 MED ORDER — MYFORTIC 180 MG TABLET,DELAYED RELEASE
ORAL_TABLET | Freq: Two times a day (BID) | ORAL | 11 refills | 30 days | Status: CP
Start: 2019-11-08 — End: 2020-11-07
  Filled 2019-11-14: qty 120, 30d supply, fill #0

## 2019-11-08 MED ORDER — METOPROLOL SUCCINATE ER 25 MG TABLET,EXTENDED RELEASE 24 HR
ORAL_TABLET | Freq: Every day | ORAL | 11 refills | 30.00000 days | Status: CP
Start: 2019-11-08 — End: 2020-11-07

## 2019-11-08 MED ORDER — METOPROLOL SUCCINATE ER 25 MG TABLET,EXTENDED RELEASE 24 HR: 13 mg | tablet | Freq: Every day | 11 refills | 30 days | Status: AC

## 2019-11-08 NOTE — Unmapped (Signed)
COVID Pre-Procedure Intake Form     Assessment     Louis Dennis is a 58 y.o. male presenting to [facility] for COVID testing.     Plan     If no testing performed, pt counseled on routine care for respiratory illness.  If testing performed, COVID sent.  Patient directed to Home given findings during today's visit.    Subjective     Louis Dennis is a 58 y.o. male who [facility] with complaints of the following:    Exposure History: In the last 21 days?     Have you traveled outside of [place]? No               Have you been in close contact with someone confirmed by a test to have COVID? (Close contact is within 6 feet for at least 10 minutes) No       Have you worked in a health care facility? No     Lived or worked facility like a nursing home, group home, or assisted living?    No         Are you scheduled to have surgery or a procedure in the next 3 days? Yes               Are you scheduled to receive cancer chemotherapy within the next 7 days?    No     Have you ever been tested before for COVID-19 with a swab of your nose? Yes: When: unknown, Where: unknown   Are you a healthcare worker being tested so to return to work No     Right now,  do you have any of the following that developed over the past 7 days (as stated by patient on intake form):    Subjective fever (felt feverish) No   Chills (especially repeated shaking chills) No   Severe fatigue (felt very tired) No   Muscle aches No   Runny nose No   Sore throat No   Loss of taste or smell No   Cough (new onset or worsening of chronic cough) No   Shortness of breath No   Nausea or vomiting No   Headache No   Abdominal Pain No   Diarrhea (3 or more loose stools in last 24 hours) No Scribe's Attestation: Levell July. Elenie Coven, FNP, obtained and performed the history, physical exam and medical decision making elements that were entered into the chart.  Signed by Victorino Sparrow serving as Scribe, on 11/08/2019 11:27 AM      The documentation recorded by the scribe accurately reflects the service I personally performed and the decisions made by me. Vedia Coffer, FNP  November 09, 2019 11:04 AM

## 2019-11-08 NOTE — Unmapped (Signed)
AOBP:Left arm  Medium cuff   Average:125/71 Pulse:61  1st reading:134/72 Pulse:60  2nd reading:124/71Pulse:61  3rd reading:118/69 Pulse:63

## 2019-11-08 NOTE — Unmapped (Signed)
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 Pt having Parathyroidectomy on Monday 11/11/2019.   Assessment  BP: 125/71, at home 114-117/60-70s, pt states he does feel lightheaded at times.  Headache: no  Hand tremors: no  Numbness/tingling: no  Fevers: no  Chills/sweats: no  Shortness of breath: no  Chest pain or pressure: no  Palpitations: no  Nausea/vomiting: no  Diarrhea/constipation: no  UTI symptoms: no  Swelling: no    Good appetite; reports adequate hydration.     Any new medications? no  Immunosuppressant last taken: 9am    I spent a total of 10 minutes with [name] reviewing medications and symptoms.

## 2019-11-08 NOTE — Unmapped (Signed)
Transplant Nephrology Clinic Visit    History of Present Illness  [name] is a 58 y.o. 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  -Adequate perfusion of the transplant kidney. Resistive indices within the mid main renal artery and at the anastomosis are slightly elevated. Attention on follow-up.  ??  Current Immunosuppression Regimen:   - Myfortic 360mg  BID  - Envarsus 3mg  QD    Subjective/Interval:   Since patient's last visit in the transplant clinic - patient has been doing well in terms of transplant, taking transplant medications regularly, no episodes of rejection and no side effects of medications. Since last visit patient seen by Surgical Oncology regarding his abnormal parathyroid glands vs possible thyroid nodules on US imaging.  He is scheduled for a total parathyroidectomy with autotransplantation vs subtotal parathyroidectomy on 11/11/2019.  Patient seen by Endocrinology on 10/23/2019 due to abnormal thyroid function tests.  No changes made.  Patient to follow up in 1 year with ultrasound.     He presents today with continued complaints of ringing/white noise in the ears.  He has used Debrox in the past which helped but now it isn't getting any better or worse. BP at home 114-117/60-70's. Patient having lightheadedness with movements. He denies headaches, chest pain, shortness of breath. He continues to have some right shoulder pain, but this gets better if he stretches and does some exercises.  He denies abdominal pain, n/v/d, edema, tremors, dysuria, and hematuria.     Takes his Envarsus at 7:00 am every morning.     Review of Systems  Otherwise as per HPI, all other systems reviewed and are negative.    Medications  Current Outpatient Medications   Medication Sig Dispense Refill   ??? acetaminophen (TYLENOL) 500 MG tablet Take 1-2 tablets (500-1,000 mg total) by mouth every six (6) hours as needed for pain. 100 tablet 0   ??? allopurinoL (ZYLOPRIM) 100 MG tablet Take 1 tablet (100 mg total) by mouth daily. 30 tablet 11   ??? aspirin (ECOTRIN) 81 MG tablet Take 1 tablet (81 mg total) by mouth daily. 30 tablet 11   ??? biotin 5 mg cap Take 1 capsule (5,000 mcg total) by mouth daily.  0   ??? cinacalcet (SENSIPAR) 60 MG tablet Take 3 tablets (180 mg total) by mouth daily. 90 tablet 11   ??? dapsone 100 MG tablet Take 1 tablet (100 mg total) by mouth daily. 30 tablet 4   ??? magnesium oxide-Mg AA chelate (MAGNESIUM, AMINO ACID CHELATE,) 133 mg Tab Take 3 tablets by mouth Two (2) times a day. 200 tablet 5 ??? metoprolol tartrate (LOPRESSOR) 25 MG tablet Take one-half  tablet (12.5 mg total) by mouth Two (2) times a day. 30 tablet 11   ??? MYFORTIC 180 mg EC tablet Take 3 tablets (540 mg total) by mouth Two (2) times a day. 180 tablet 11   ??? omeprazole (PRILOSEC) 20 MG capsule Take 1 capsule (20 mg total) by mouth every other day. 15 capsule 2   ??? tacrolimus (ENVARSUS XR) 1 mg Tb24 extended release tablet Take 3 tablets (3 mg total) by mouth daily. 90 tablet 11     No current facility-administered medications for this visit.        Physical Exam  BP 125/71 (BP Site: L Arm, BP Position: Sitting, BP Cuff Size: Medium)  - Pulse 61  - Temp 36.5 ??C (97.7 ??F) (Temporal)  - Ht 162.6 cm (5' 4)  - Wt 66.7 kg (147 lb)  - BMI 25.23 kg/m??   General: Well appearing male, no acute distress  HEENT: wearing mask, EOMI, sclera anicteric; bilateral ear drums visible without erythema or bulging; ear wax present bilaterally with R>L  Neck: Neck supple, no cervical lymphadenopathy appreciated. No overt palpable thyroid nodules. No tenderness with palaption.  CV: normal rate, normal rhythm, no murmur, no gallops, no rubs appreciated  Lungs: clear to auscultation bilaterally, normal work of breathing.  Abdomen: soft, non tender, RLQ surgical site healed. Bowel sounds present  Extremities: no edema  Musculoskeletal: no visible deformity, normal range of motion.  Pulses: intact distally throughout  Neurologic: awake, alert, and oriented x3    Laboratory Data and Imaging reviewed in EPIC    Assessment: [name] is a 58 y.o. 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     Recommendations/Plan:   Allograft Function: renal function stable at 0.80 with a baseline of approximately 0.7-0.9 since transplant. Will continue to monitor. Immunosuppression Management [High Risk Medical Decision Making For Drug Therapy Requiring Intensive Monitoring For Toxicity]:  Tacrolimus trough today is pending, but will be erroneous due to patient has already taken his medications this morning. Targeting tacrolimus trough level of approximately 8-10. Patient on Envarsus 3mg  every day. On decreased Myfortic 360 mg BID due to leukopenia.  WBC 1.1 today, which is up from 0.9 on 11/04/2019    Blood Pressure Management: BP 125/71 at this visit. Switch patient to Toprol-XL 25 mg at night    Infectious Prophylaxis and Monitoring:   Transitioned from Bactrim to Dapsone on 07/04/19 due to stable mild hyperkalemia.  No decoys present 09/03/19. Last CMV VL detected at 149 (11/04/19) with today's pending. Holding Valcyte.  The patient continues on Dapsone prophylaxis. Ends 11/22/20    Hypercalcemia: Persistent elevated 12 - 12.5 since transplantation despite progressive up-titration of Sensipar. Reports compliance and no offending medications identified. PTH (06/24/19) 242.6.  Ca 9.8 today.  - Continue Sensipar 180mg  daily  - L Thyroid nodule FNA (05/13/19) with benign thyroid epithelium and colloid  - Routine cancer screening up to date prior to transplant  - Parathyroid US showed adenomas. - Has been seen by Endocrinology and Surgical Oncology. Is scheduled to undergo parathyroidectomy on 11/11/19.  - Parathyroid surgery scheduled for 11/11/2019    Lower Back Pain with midline radiculopathy: Known underlying degenerative changes. Resolved per Patient.     Hypomagnesemia: Stable at 1.5, likely due to tarolimus. Continue maintenance oral supplementation    Lipid Management: Lipid panel drawn 03/01/19 showed LDL 80, HDL 36, TChol 151. Will continue to monitor  Atrial Fibrillation: Last EKG showed NSR.  Patient saw his cardiologist on 05/21/19 (care everywhere).  Echo on 05/08/19 showed EF 50-55%.  Atrial fibrillation diagnosed 05/2019. Heart palpitations have stopped per patient. Ringing in Ears: Although the ringing has decreased it is still present and becoming worse in the right ear.  It is becoming bothersome.  He is having lightheadedness with movement, so his BP meds were adjusted, but could also be a side effect from this.  Patient referred to ENT.    Health Maintenance:   - Colonoscopy due 2029    Immunizations:  - Vaccination: PCV-23 completed 07/06/2015.   -Flu shot: 08/06/19  - Prevnar 13 to be given at 1 year post    Counseling:  I counseled the patient on the need to avoid sun exposure and the use of sunblock while outdoors given the relatively higher risk of skin malignancy in an immunosuppressed state and the need for adherence to immunosuppression medication.  Patient verbalized understanding.     Follow-Up:  Return to clinic in 4 weeks   Patient will continue to follow-up with his primary care provider for non-transplant related issues and medication refills. We have ordered transplant specific labs per the center's guidelines to monitor and assess for toxicities from immunosuppressant drug therapy

## 2019-11-10 NOTE — Unmapped (Signed)
[facility] Specialty Medication Onboarding    Specialty Medication:  MYFORTIC 180MG  TABLETS  Prior Authorization: Not Required   Financial Assistance: No - copay  <$25  Final Copay/Day Supply: $0 / 30 DAYS    Insurance Restrictions: Yes - max 1 month supply     Notes to Pharmacist: DOSE CHANGE, SCHEDULED TO SHIP 12/10    The triage team has completed the benefits investigation and has determined that the patient is able to fill this medication at [facility]. Please contact the patient to complete the onboarding or follow up with the prescribing physician as needed.

## 2019-11-11 ENCOUNTER — Encounter
Admit: 2019-11-11 | Discharge: 2019-11-13 | Disposition: A | Discharge status: Home / Self Care | Payer: MEDICARE | Attending: Anesthesiology

## 2019-11-11 ENCOUNTER — Ambulatory Visit: Admit: 2019-11-11 | Discharge: 2019-11-13 | Disposition: A | Discharge status: Home / Self Care | Payer: MEDICARE

## 2019-11-11 LAB — CMV DNA, QUANTITATIVE, PCR: CMV QUANT LOG10: 2.32 {Log_IU}/mL — ABNORMAL HIGH (ref <0.00)

## 2019-11-11 LAB — CMV QUANT: Lab: 208 — ABNORMAL HIGH

## 2019-11-11 LAB — PARATHYROID HORMONE INTACT: Parathyrin.intact:MCnc:Pt:Ser/Plas:Qn:: 9.2 — ABNORMAL LOW

## 2019-11-11 LAB — BASIC METABOLIC PANEL
ANION GAP: 9 mmol/L (ref 7–15)
BLOOD UREA NITROGEN: 18 mg/dL (ref 7–21)
CALCIUM: 10.5 mg/dL — ABNORMAL HIGH (ref 8.5–10.2)
CHLORIDE: 103 mmol/L (ref 98–107)
CREATININE: 0.76 mg/dL (ref 0.70–1.30)
EGFR CKD-EPI AA MALE: >90 mL/min/{1.73_m2} (ref >=60)
EGFR CKD-EPI NON-AA MALE: >90 mL/min/{1.73_m2} (ref >=60)
POTASSIUM: 5.8 mmol/L — ABNORMAL HIGH (ref 3.5–5.0)
SODIUM: 137 mmol/L (ref 135–145)

## 2019-11-11 LAB — POTASSIUM: Potassium:SCnc:Pt:Ser/Plas:Qn:: 5.8 — ABNORMAL HIGH

## 2019-11-11 LAB — INTRAOPERATIVE PTH
Parathyrin.intact:MCnc:Pt:Ser/Plas:Qn:: 23.1
Parathyrin.intact:MCnc:Pt:Ser/Plas:Qn:: 89.1 — ABNORMAL HIGH

## 2019-11-11 LAB — PTH: PARATHYROID HORMONE INTACT: 9.2 pg/mL — ABNORMAL LOW (ref 12.0–72.0)

## 2019-11-11 MED ADMIN — fentaNYL (PF) (SUBLIMAZE) injection: INTRAVENOUS | @ 17:00:00 | Stop: 2019-11-11

## 2019-11-11 MED ADMIN — phenylephrine HCl in 0.9% NaCl 0.8 mg/10 mL (80 mcg/mL) injection Syrg: INTRAVENOUS | @ 19:00:00 | Stop: 2019-11-11

## 2019-11-11 MED ADMIN — acetaminophen (TYLENOL) tablet 1,000 mg: 1000 mg | ORAL | @ 16:00:00 | Stop: 2019-11-11

## 2019-11-11 MED ADMIN — oxyCODONE (ROXICODONE) immediate release tablet 5 mg: 5 mg | ORAL | @ 21:00:00 | Stop: 2019-11-11

## 2019-11-11 MED ADMIN — remifentaniL (ULTIVA) injection: INTRAVENOUS | @ 18:00:00 | Stop: 2019-11-11

## 2019-11-11 MED ADMIN — succinylcholine (ANECTINE) injection: INTRAVENOUS | @ 17:00:00 | Stop: 2019-11-11

## 2019-11-11 MED ADMIN — ondansetron (ZOFRAN) injection: INTRAVENOUS | @ 19:00:00 | Stop: 2019-11-11

## 2019-11-11 MED ADMIN — midazolam (VERSED) injection: INTRAVENOUS | @ 17:00:00 | Stop: 2019-11-11

## 2019-11-11 MED ADMIN — celecoxib (CeleBREX) capsule 200 mg: 200 mg | ORAL | @ 16:00:00 | Stop: 2019-11-11

## 2019-11-11 MED ADMIN — lidocaine (XYLOCAINE) 20 mg/mL (2 %) injection: INTRAVENOUS | @ 17:00:00 | Stop: 2019-11-11

## 2019-11-11 MED ADMIN — glycopyrrolate (ROBINUL) injection: INTRAVENOUS | @ 18:00:00 | Stop: 2019-11-11

## 2019-11-11 MED ADMIN — atropine 0.4 mg/mL injection: INTRAVENOUS | @ 18:00:00 | Stop: 2019-11-11

## 2019-11-11 MED ADMIN — phenylephrine HCl in 0.9% NaCl 0.8 mg/10 mL (80 mcg/mL) injection Syrg: INTRAVENOUS | @ 18:00:00 | Stop: 2019-11-11

## 2019-11-11 MED ADMIN — bupivacaine (PF) (MARCAINE) 0.5 % (5 mg/mL) 10 mL, EPINEPHrine (ADRENALIN) 1 mg: @ 17:00:00 | Stop: 2019-11-11

## 2019-11-11 MED ADMIN — phenylephrine HCl in 0.9% NaCl 0.8 mg/10 mL (80 mcg/mL) injection Syrg: INTRAVENOUS | @ 17:00:00 | Stop: 2019-11-11

## 2019-11-11 MED ADMIN — fentaNYL (PF) (SUBLIMAZE) injection 25 mcg: 25 ug | INTRAVENOUS | @ 20:00:00 | Stop: 2019-11-11

## 2019-11-11 MED ADMIN — propofoL (DIPRIVAN) injection: INTRAVENOUS | @ 17:00:00 | Stop: 2019-11-11

## 2019-11-11 MED ADMIN — lactated Ringers infusion: INTRAVENOUS | @ 18:00:00 | Stop: 2019-11-11

## 2019-11-11 MED ADMIN — ePHEDrine (PF) 25 mg/5 mL (5 mg/mL) in 0.9% sodium chloride syringe Syrg: INTRAVENOUS | @ 17:00:00 | Stop: 2019-11-11

## 2019-11-11 MED ADMIN — fentaNYL (PF) (SUBLIMAZE) injection: INTRAVENOUS | @ 20:00:00 | Stop: 2019-11-11

## 2019-11-11 MED ADMIN — phenylephrine 20 mg in sodium chloride 0.9% 250 mL (80 mcg/mL) infusion PMB: INTRAVENOUS | @ 18:00:00 | Stop: 2019-11-11

## 2019-11-11 MED ADMIN — lactated Ringers infusion: 10 mL/h | INTRAVENOUS | @ 17:00:00 | Stop: 2019-11-11

## 2019-11-11 MED ADMIN — fentaNYL (PF) (SUBLIMAZE) injection: INTRAVENOUS | @ 18:00:00 | Stop: 2019-11-11

## 2019-11-11 NOTE — Unmapped (Signed)
Operative Note    Date of Surgery: 11/11/2019  Admit Date: 11/11/2019  Performing Service: Surgical Oncology  Surgeon(s) and Role:     * Johny Chess, MD - Primary     * Kathie Dike, MD - Resident - Assisting      Pre-op Diagnosis: Secondary hyperparathyroidism    Post-op Diagnosis: same    RS 22 PARATHYROIDECTOMY OR EXPLORATION OF PARATHYROID(S)    Anesthesia: General    Indications: Louis Dennis with persistent hyperparathyroidism s/p kidney transplant    Findings: All four glands firm and enlarged, 3 1/2 gland resection with L inferior gland transected and marked    Procedure: The patient was taken to the OR and placed in the supine position. Timeouts were performed verifying antibiotics and SCDs, then general anesthesia was induced and the patient was prepped and draped in the usual sterile fashion.    A transverse curvilinear incision was created along the anterior neck after local anesthesia was applied to the underlying tissue.  Subcutaneous tissues and platysma were divided with cautery.  The platysma was undermined superiorly and inferiorly using cautery.  The strap muscles were divided in the midline and retracted laterally.    We started with dissection of the right parathyroid glands.  A right superior parathyroid gland was identified underlying the middle thyroid artery and vein.  These vessels were preserved and the parathyroid gland was dissected out.  The gland was noted to be hard with considerable scarring around it. The vessels connecting to the gland were controlled with cautery and clips.  We then identified a right inferior parathyroid gland.  The recurrent laryngeal nerve was identified just deep to the gland and its function was confirmed using intraoperative nerve monitoring. We then turned our attention to the left sided parathyroid glands.  We identified a left superior parathyroid gland in its usual posterior position.  It was dissected out using cautery and clips to control its blood supply.  We then identified a left inferior parathyroid gland which was again dissected out.  We elected to preserve one half of the left inferior parathyroid gland.  Care was taken to transect the gland distal to its blood supply.  Bipolar cautery was used to achieve hemostasis along the cut surface of the gland.    We then proceeded with the resection of the right inferior parathyroid gland.  We avoided the use of cautery deep to the gland and intraoperative nerve monitoring confirmed the function of the right recurrent laryngeal nerve throughout the dissection.  After removing the right inferior gland we then turned our attention back to the left side and identified and confirmed the function of the left recurrent laryngeal nerve.    The strap muscles were reapproximated in the midline using 2-0 Vicryl interrupted stitches.  The platysma was closed using interrupted 3-0 Vicryl stitches.  The skin was closed using barbed suture.    Counts were correct. The patient was extubated and taken to the PACU in stable condition.    Estimated Blood Loss: 20 mL    Complications: None    Specimens: None    Surgeon Notes: I was present and scrubbed for the entire procedure        Kathie Dike, MD    Date: 11/11/2019  Time: 3:04 PM

## 2019-11-12 LAB — BASIC METABOLIC PANEL
ANION GAP: 7 mmol/L (ref 7–15)
BLOOD UREA NITROGEN: 27 mg/dL — ABNORMAL HIGH (ref 7–21)
BLOOD UREA NITROGEN: 23 mg/dL — ABNORMAL HIGH (ref 7–21)
BUN / CREAT RATIO: 19
CALCIUM: 7.1 mg/dL — ABNORMAL LOW (ref 8.5–10.2)
CHLORIDE: 95 mmol/L — ABNORMAL LOW (ref 98–107)
CHLORIDE: 96 mmol/L — ABNORMAL LOW (ref 98–107)
CO2: 26 mmol/L (ref 22.0–30.0)
CO2: 24 mmol/L (ref 22.0–30.0)
CREATININE: 1.39 mg/dL — ABNORMAL HIGH (ref 0.70–1.30)
CREATININE: 1.25 mg/dL (ref 0.70–1.30)
EGFR CKD-EPI AA MALE: 72 mL/min/{1.73_m2} (ref >=60)
EGFR CKD-EPI NON-AA MALE: 55 mL/min/{1.73_m2} — ABNORMAL LOW (ref >=60)
EGFR CKD-EPI NON-AA MALE: 63 mL/min/{1.73_m2} (ref >=60)
GLUCOSE RANDOM: 163 mg/dL (ref 70–179)
POTASSIUM: 4.5 mmol/L (ref 3.5–5.0)
SODIUM: 128 mmol/L — ABNORMAL LOW (ref 135–145)
SODIUM: 128 mmol/L — ABNORMAL LOW (ref 135–145)

## 2019-11-12 LAB — PTH: CALCIUM: 8 mg/dL — ABNORMAL LOW (ref 8.5–10.2)

## 2019-11-12 LAB — PARATHYROID HORMONE INTACT: Parathyrin.intact:MCnc:Pt:Ser/Plas:Qn:: 6.7 — ABNORMAL LOW

## 2019-11-12 LAB — CALCIUM
Calcium:MCnc:Pt:Ser/Plas:Qn:: 7.1 — ABNORMAL LOW
Calcium:MCnc:Pt:Ser/Plas:Qn:: 7.4 — ABNORMAL LOW

## 2019-11-12 LAB — TACROLIMUS, TROUGH: Lab: 4.6 — ABNORMAL LOW

## 2019-11-12 LAB — GLUCOSE RANDOM: Glucose:MCnc:Pt:Ser/Plas:Qn:: 161

## 2019-11-12 LAB — MAGNESIUM
MAGNESIUM: 1.3 mg/dL — ABNORMAL LOW (ref 1.6–2.2)
Magnesium:MCnc:Pt:Ser/Plas:Qn:: 1.3 — ABNORMAL LOW

## 2019-11-12 LAB — BUN / CREAT RATIO: Urea nitrogen/Creatinine:MRto:Pt:Ser/Plas:Qn:: 19

## 2019-11-12 MED ADMIN — calcium carbonate (TUMS) chewable tablet 600 mg elem calcium: 600 mg | ORAL | @ 19:00:00 | Stop: 2019-11-13

## 2019-11-12 MED ADMIN — sodium chloride 0.9% (NS) bolus 1,000 mL: 1000 mL | INTRAVENOUS | @ 19:00:00 | Stop: 2019-11-12

## 2019-11-12 MED ADMIN — oxyCODONE (ROXICODONE) immediate release tablet 5 mg: 5 mg | ORAL | @ 07:00:00 | Stop: 2019-11-12

## 2019-11-12 MED ADMIN — oxyCODONE (ROXICODONE) immediate release tablet 5 mg: 5 mg | ORAL | @ 11:00:00 | Stop: 2019-11-12

## 2019-11-12 MED ADMIN — tacrolimus (ENVARSUS XR) extended release tablet 3 mg: 3 mg | ORAL | @ 13:00:00 | Stop: 2019-11-13

## 2019-11-12 MED ADMIN — calcium carbonate (TUMS) chewable tablet 600 mg elem calcium: 600 mg | ORAL | @ 14:00:00 | Stop: 2019-11-13

## 2019-11-12 MED ADMIN — acetaminophen (TYLENOL) tablet 1,000 mg: 1000 mg | ORAL | @ 11:00:00 | Stop: 2019-11-12

## 2019-11-12 MED ADMIN — pantoprazole (PROTONIX) EC tablet 20 mg: 20 mg | ORAL | @ 15:00:00 | Stop: 2019-11-13

## 2019-11-12 MED ADMIN — oxyCODONE (ROXICODONE) immediate release tablet 5 mg: 5 mg | ORAL | @ 03:00:00 | Stop: 2019-11-12

## 2019-11-12 MED ADMIN — allopurinoL (ZYLOPRIM) tablet 100 mg: 100 mg | ORAL | @ 15:00:00 | Stop: 2019-11-13

## 2019-11-12 NOTE — Unmapped (Addendum)
Louis Dennis is a 59M with tertiary hyperparathyroidism who was admitted on 11/11/2019 taken to the OR on 11/11/2019 for a subtotal parathyroidectomy with preservation of L inferior gland. He tolerated the procedure well, was extubated in the OR, and was taken to the PACU where he received routine postoperative care before being transferred to the floor. He did well postoperatively. His diet was advanced and at the time of discharge he was tolerating a regular diet. The patient had his pain controlled with P.O. pain medication, and ambulate well with minimal assistance. He was able to void spontaneously and had good bowel function. Ca + PTH levels were checked post-operatively and showed low PTH (6.4) and calcium (6.8). His home cinecalcet was discontinued. On hospital day #1, he reported perioral tingling, dizziness, and hand numbness. This improved with addition of tums and calcitriol. His symptoms of hypocalemia improved by POD2 and he was ready to go home. He is being discharged home on POD 1 in stable condition with planned outpatient follow up next week.

## 2019-11-12 NOTE — Unmapped (Signed)
Patient on Fall and VTE precautions.  Bed remains in lowest position, locked, side rails up x 2 and non skid socks on patient at all times when ambulating.  SCDs on overnight, however patient asked me to take them off this morning.      Patients states effective pain control with current pain regimen, please see MAR.      VSS and afebrile and call bell kept within reach.  Incision clean dry and intact with no signs of infection or swelling noted.      Patient has c/o of signs and symptoms of low calcium.  PO calcium started today and calcium will continue to be checked and monitored.      NS bolus given please see MAR for dehydration.      Multi interdisciplinary team approach involved in patients care.    Problem: Wound  Goal: Optimal Wound Healing  Outcome: Ongoing - Unchanged     Problem: Skin Injury Risk Increased  Goal: Skin Health and Integrity  Outcome: Ongoing - Unchanged     Problem: Adult Inpatient Plan of Care  Goal: Plan of Care Review  Outcome: Ongoing - Unchanged  Goal: Patient-Specific Goal (Individualization)  Outcome: Ongoing - Unchanged  Goal: Absence of Hospital-Acquired Illness or Injury  Outcome: Ongoing - Unchanged  Goal: Optimal Comfort and Wellbeing  Outcome: Ongoing - Unchanged  Goal: Readiness for Transition of Care  Outcome: Ongoing - Unchanged  Goal: Rounds/Family Conference  Outcome: Ongoing - Unchanged

## 2019-11-12 NOTE — Unmapped (Signed)
Pain has been well controlled with PRNs. Pt oriented to room. Falls precautions in place.   Problem: Wound  Goal: Optimal Wound Healing  Outcome: Progressing     Problem: Skin Injury Risk Increased  Goal: Skin Health and Integrity  Outcome: Progressing     Problem: Adult Inpatient Plan of Care  Goal: Plan of Care Review  Outcome: Progressing  Goal: Patient-Specific Goal (Individualization)  Outcome: Progressing  Goal: Absence of Hospital-Acquired Illness or Injury  Outcome: Progressing  Goal: Optimal Comfort and Wellbeing  Outcome: Progressing  Goal: Readiness for Transition of Care  Outcome: Progressing  Goal: Rounds/Family Conference  Outcome: Progressing

## 2019-11-12 NOTE — Unmapped (Signed)
Discharge Summary    Admit date: 11/11/2019    Discharge date and time: 11/13/2019    Discharge to:  Home    Discharge Service: [facility])    Discharge Attending Physician: Johny Chess, MD    Discharge  Diagnoses: Secondary hypoparathyroidism    Secondary Diagnosis: s/p Kidney Transplant    OR Procedures:    RS 22 PARATHYROIDECTOMY OR EXPLORATION OF PARATHYROID(S)  Date: 11/11/2019  -------------------    Discharge Day Services: The patient was seen and examined by the surgical team on the day of discharge. Vital signs and laboratory values were stable and within normal limits. Surgical wounds were examined. Discharge plan was discussed with patient, instructions for home care given, and all questions answered.    Subjective   No acute events overnight. Pain Controlled. No fever or chills. Symptoms of hypocalcemia have resolved.     Objective   Patient Vitals for the past 8 hrs:   BP Temp Temp src Pulse Resp SpO2   11/13/19 0706 121/60 35.9 ??C Oral 88 16 96 %   11/13/19 0350 92/51 35.8 ??C Oral 96 16 96 %   11/13/19 0000 97/50 35.7 ??C Oral 77 16 96 %     No intake/output data recorded.    General Appearance:   No acute distress  Neck:    Incision c/d/i with dressing in place  Lungs:                Clear to auscultation bilaterally  Heart:                           Regular rate and rhythm  Abdomen:                Soft, non-tender, non-distended  Extremities:              Warm and well perfused Hospital Course:  Louis Dennis is a 23M with tertiary hyperparathyroidism who was admitted on 11/11/2019 taken to the OR on 11/11/2019 for a subtotal parathyroidectomy with preservation of L inferior gland. He tolerated the procedure well, was extubated in the OR, and was taken to the PACU where he received routine postoperative care before being transferred to the floor. He did well postoperatively. His diet was advanced and at the time of discharge he was tolerating a regular diet. The patient had his pain controlled with P.O. pain medication, and ambulate well with minimal assistance. He was able to void spontaneously and had good bowel function. Ca + PTH levels were checked post-operatively and showed low PTH (6.4) and calcium (6.8). His home cinecalcet was discontinued. On hospital day #1, he reported perioral tingling, dizziness, and hand numbness. This improved with addition of tums and calcitriol. His symptoms of hypocalemia improved by POD2 and he was ready to go home. He is being discharged home on POD 1 in stable condition with planned outpatient follow up next week.         Condition at Discharge: Improved  Discharge Medications:      Medication List      START taking these medications    ??? calcitrioL 0.5 MCG capsule; Commonly known as: ROCALTROL; Take 1 capsule   (0.5 mcg total) by mouth Two (2) times a day.  ??? calcium carbonate 200 mg calcium (500 mg) chewable tablet; Commonly   known as: TUMS; Chew 3 tablets (600 mg elem calcium total) Three (3) times   a day.  ???  traMADoL 50 mg tablet; Commonly known as: ULTRAM; Take 1 tablet (50 mg   total) by mouth every six (6) hours as needed for up to 5 days.     CONTINUE taking these medications    ??? allopurinoL 100 MG tablet; Commonly known as: ZYLOPRIM; Take 1 tablet   (100 mg total) by mouth daily.  ??? aspirin 81 MG tablet; Commonly known as: ECOTRIN; Take 1 tablet (81 mg   total) by mouth daily. ??? biotin 5 mg Cap; Take 1 capsule (5,000 mcg total) by mouth daily.  ??? ENVARSUS XR 1 mg Tb24 extended release tablet; Generic drug: tacrolimus;   Take 3 tablets (3 mg total) by mouth daily.  ??? magnesium (amino acid chelate) 133 mg Tab; Generic drug: magnesium   oxide-Mg AA chelate; Take 3 tablets by mouth Two (2) times a day.  ??? metoprolol succinate 25 MG 24 hr tablet; Commonly known as: TOPROL-XL;   Take one-half tablet (12.5 mg total) by mouth nightly with night   medications.  ??? MYFORTIC 180 MG EC tablet; Generic drug: mycophenolate; Take 2 tablets   (360 mg total) by mouth Two (2) times a day.  ??? omeprazole 20 MG capsule; Commonly known as: PriLOSEC; Take 1 capsule   (20 mg total) by mouth every other day.     STOP taking these medications    ??? SENSIPAR 60 MG tablet; Generic drug: cinacalcet       Pending Test Results: Surgical Path    Discharge Instructions:    Other Instructions     Discharge instructions      You will be sent home with a clear plastic bandage over a small Telfa bandage.  This should stay on for about a week.  You may bathe or shower with the bandage on.  After a week you may remove the outer plastic dressing and the Telfa.  Underneath will be some dressing tapes.  Just leave those on for another week or until they start to come off by themselves.  You may continue to shower or bathe with these in place.  Just pat the wound dry gently. You may do any activities (within reason) that you feel like doing.  No restrictions.  If it's painful or uncomfortable, then stop.    Take pain medicine such as acetaminophen (Tylenol) or ibuprofen for pain management You should receive a follow-up appointment about two weeks after surgery.  If you did not receive a follow-up appointment or if you need to reschedule please call [phone] or toll free [phone].  If you use a computer we recommend that you sign up for My [facility] Chart at [email or web address]  This allows you to review many of your lab results and manage many of your healthcare needs including appointments.  It also makes communication with your doctors very easy and efficient.    The best way to reach Dr. Selena Batten is by email at: [email or web address]  For more urgent attention he can be reached at the following numbers:    Office: [phone].  Business hours.  The person at this number will be able to reach Dr. Selena Batten but will be unable to help with things such as appointments or medical advice.   Appointments:  [phone] or toll-free [phone]  Cell:   [phone] (Emergencies only please)             Labs and Other Follow-ups after Discharge:  Follow Up instructions and Outpatient Referrals     Discharge instructions  Future Appointments:  Appointments which have been scheduled for you    Nov 19, 2019  3:15 PM  (Arrive by 3:00 PM)  Danelle Earthly POST OP with Johny Chess, MD  [facility] SURGICAL ONCOLOGY [facility] REGION) [address]  [place] [address]  [phone]      Dec 27, 2019  9:40 AM  (Arrive by 9:25 AM)  OFFICE VISIT with Loran Senters, FNP  [facility] Primary Care at [facility] REGION) [address]  [place] [address]  [phone]      Jan 03, 2020  9:00 AM  ADD ON AUDIO CC with SPCAUD AUDIOLOGIST CC  [facility] AUDIOLOGY [place] [address] [facility] REGION) [address]  [place] [address]  [phone]      Jan 03, 2020 10:00 AM  (Arrive by 9:45 AM)  NEW ENT with Joretta Bachelor, MD  [place] [facility] [address] [facility] REGION) [address] [place] [address]  [phone]

## 2019-11-12 NOTE — Unmapped (Signed)
Tacrolimus Therapeutic Monitoring Pharmacy Note    Zyheir Daft is a 58 y.o. male continuing tacrolimus.     Indication: Kidney transplant     Date of Transplant: 05/24/19      Prior Dosing Information: Home regimen Envarsus XR 3 mg every morning     Goals:  Therapeutic Drug Levels  Tacrolimus trough goal: 8-10 ng/mL    Additional Clinical Monitoring/Outcomes  ?? Monitor renal function (SCr and urine output) and liver function (LFTs)  ?? Monitor for signs/symptoms of adverse events (e.g., hyperglycemia, hyperkalemia, hypomagnesemia, hypertension, headache, tremor)    Results:   Tacrolimus level: Not applicable    Pharmacokinetic Considerations and Significant Drug Interactions:  ? Concurrent hepatotoxic medications: allopurinol  ? Concurrent CYP3A4 substrates/inhibitors: pantoprazole  ? Concurrent nephrotoxic medications: None identified    Assessment/Plan:  Recommendedation(s)  ? Continue current regimen of Envarsus XL 3 mg every morning    Follow-up  ? Next level has been ordered on 11/12/19 at 0400.   ? A pharmacist will continue to monitor and recommend levels as appropriate    Please page service pharmacist with questions/clarifications.    Dario Ave, PharmD

## 2019-11-12 NOTE — Unmapped (Signed)
Social Work  Psychosocial Assessment    Patient Name: Louis Dennis   Medical Record Number: [id number]   Date of Birth: 1961/09/10  Sex: Male     Referral  Referred by: Care Manager  Reason for Referral: Complex Discharge Planning  No Psychosocial Interventions Necessary: No Psychosocial Interventions Necessary    Extended Emergency Contact Information  Primary Emergency Contact: [facility]  Address: [address]           [place], [place] [address] [place]  Home Phone: [phone]  Mobile Phone: [phone]  Relation: Spouse  Secondary Emergency Contact: [employer], [name]  Mobile Phone: [phone]  Relation: Son    Legal Next of Kin / Guardian / POA / Advance Directives    HCDM (patient stated preference): [name], [name] - [phone]    HCDM [facility] policy, no known patient preference): [name], [name] - Spouse - [phone]    Advance Directive (Medical Treatment)  Does patient have an advance directive covering medical treatment?: Patient does not have advance directive covering medical treatment.    Health Care Decision Maker [HCDM] (Medical & Mental Health Treatment)  Healthcare Decision Maker: Patient does not wish to appoint a Health Care Decision Maker at this time  Information offered on HCDM, Medical & Mental Health advance directives:: Patient given information.    Discharge Planning  Discharge Planning Information:   Type of Residence   Mailing Address:  [address]  [place] [address]    Medical Information   Past Medical History:   Diagnosis Date   ??? Abnormal thyroid function test    ??? End stage renal disease (CMS-HCC)     now on peritoneal dialysis   ??? Gout     reports was in Right hand, foot and left elbow   ??? Hypertension     takes meds intermitttently based on readings   ??? Nephrolithiasis 2014       Past Surgical History:   Procedure Laterality Date   ??? OTHER SURGICAL HISTORY Right 08/2017    Pt had catheter moved from left side to right side. ??? pd catheter  2012   ??? PR COLONOSCOPY W/BIOPSY SINGLE/MULTIPLE N/A 06/15/2018    Procedure: COLONOSCOPY, FLEXIBLE, PROXIMAL TO SPLENIC FLEXURE; WITH BIOPSY, SINGLE OR MULTIPLE;  Surgeon: Charm Rings, MD;  [facility];  Service: Gastroenterology   ??? PR COLSC FLX W/RMVL OF TUMOR POLYP LESION SNARE TQ N/A 06/15/2018    Procedure: COLONOSCOPY FLEX; W/REMOV TUMOR/LES BY SNARE;  Surgeon: Charm Rings, MD;  Location: [facility];  Service: Gastroenterology   ??? PR EXPLORATORY OF ABDOMEN N/A 11/22/2018    Procedure: EXPLORATORY LAPAROTOMY, EXPLORATORY CELIOTOMY WITH OR WITHOUT BIOPSY(S);  Surgeon: Lawrence Marseilles Day Thurnell Lose, MD;  Location: [facility] OR [facility];  Service: Trauma   ??? PR EXPLORE PARATHYROID GLANDS N/A 11/11/2019    Procedure: RS 22 PARATHYROIDECTOMY OR EXPLORATION OF PARATHYROID(S);  Surgeon: Johny Chess, MD;  Location: MAIN OR [facility];  Service: Surgical Oncology   ??? PR FREEING BOWEL ADHESION,ENTEROLYSIS N/A 11/22/2018    Procedure: Enterolysis (Separt Proc);  Surgeon: Lawrence Marseilles Day Thurnell Lose, MD;  Location: MAIN OR [facility];  Service: Trauma   ??? PR REMOVE PERITONEAL FOREIGN BODY N/A 11/22/2018    Procedure: Removal Of Peritoneal Of Foreign Body From Peritoneal Cavity;  Surgeon: Lawrence Marseilles Day Thurnell Lose, MD;  Location: MAIN OR [facility];  Service: Trauma   ??? PR TRANSPLANT,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  Procedure: BACKBNCH STD PREP CAD DONR RENAL ALLOGFT PRIOR TO TRNSPLNT, INCL DISSEC/REM PERINEPH FAT, DIAPH/RTPER ATTAC;  Surgeon: Doyce Loose, MD;  Location: MAIN OR [facility];  Service: Transplant   ??? PR TRANSPLANTATION OF KIDNEY N/A 05/24/2019    Procedure: RENAL ALLOTRANSPLANTATION, IMPLANTATION OF GRAFT; WITHOUT RECIPIENT NEPHRECTOMY;  Surgeon: Doyce Loose, MD;  Location: MAIN OR [facility];  Service: Transplant       Family History   Problem Relation Age of Onset   ??? Hypertension Mother    ??? Kidney disease Mother    ??? Diabetes Mother ??? Hypertension Father    ??? Kidney disease Sister         s/p transplant   ??? Hypertension Brother    ??? Glaucoma Neg Hx    ??? Amblyopia Neg Hx    ??? Blindness Neg Hx    ??? Retinal detachment Neg Hx    ??? Strabismus Neg Hx    ??? Macular degeneration Neg Hx    ??? Basal cell carcinoma Neg Hx    ??? Melanoma Neg Hx    ??? Squamous cell carcinoma Neg Hx        Financial Information   Primary Insurance: Payor: MEDICARE / Plan: MEDICARE PART A AND PART B / Product Type: *No Product type* /    Secondary Insurance: Secondary Insurance  MEDICAID [place]   Prescription Coverage: Nurse, learning disability (listed above)   Preferred Pharmacy: [id number] - [place], [place] - [address]  [place] [facility]  [place] [facility]    Barriers to taking medication: No    Transition Home   Transportation at time of discharge: Family/Friend's Private Vehicle    Anticipated changes related to Illness: none   Services in place prior to admission: N/A   Services anticipated for DC: N/A   Hemodialysis Prior to Admission: No    Readmission  Risk of Unplanned Readmission Score: UNPLANNED READMISSION SCORE: 8%  Readmitted Within the Last 30 Days?   Readmission Factors include: unable to assess    Social Determinants of Health  Social Determinants of Health were addressed in provider documentation.  Please refer to patient history., hx of financial concerns    Social History  Support Systems/Concerns: Family Members    Medical and Psychiatric History  Psychosocial Stressors: Denies      Psychological Issues/Information: No issues  Chemical Dependency: None  Legal: No legal issues      Ability to [employer]: No issues accessing community services      Assessment completed via record review; patient anticipated to be discharged with no needs. CM continues to be available for any discharge needs.

## 2019-11-12 NOTE — Unmapped (Signed)
Pain has been stable and well-managed w/ sched & prn medication. Pt has been free of falls and follows safety precautions appropriately. Plans to monitor Ca levels.     Problem: Adult Inpatient Plan of Care  Goal: Plan of Care Review  Outcome: Ongoing - Unchanged  Goal: Patient-Specific Goal (Individualization)  Outcome: Ongoing - Unchanged  Goal: Absence of Hospital-Acquired Illness or Injury  Outcome: Ongoing - Unchanged  Goal: Optimal Comfort and Wellbeing  Outcome: Ongoing - Unchanged  Goal: Readiness for Transition of Care  Outcome: Ongoing - Unchanged     Problem: Fall Injury Risk  Goal: Absence of Fall and Fall-Related Injury  Outcome: Ongoing - Unchanged     Problem: Pain Acute  Goal: Optimal Pain Control  Outcome: Ongoing - Unchanged

## 2019-11-12 NOTE — Unmapped (Signed)
Surg Onc Progress Note     Assessment/Plan  58 yo male with hx of ESRD s/p kidney transplant and secondary hyperparathyroidism who is s/p subtotal parathyroidectomy on 11/11/2019.     -Start tums 600 mg TID and calcitriol 0.5 mcg BID  -Recheck calcium at 12:00 pm   -Monitor for s/s of hypocalcemia.     Louis Dennis T. Loura Pardon, MD  [facility]  General Surgery, [place]  [id number]    Subjective: Reports feeling well this AM. No perioral tingling/numbness. No numbness/tingling in fingers. Calcium 8 this AM with PTH of 6.7. No issues with breathing, swallowing, or eating.     Objective:   Vitals:    11/12/19 0808   BP: 152/74   Pulse:    Resp:    Temp:    SpO2: 98%     Gen: NAD  Resp: on room air  HEENT: Incisional dressing is c/d/i. No hematoma.     Lab Results   Component Value Date    WBC 1.1 (L) 11/08/2019    HGB 11.4 (L) 11/08/2019    HCT 37.8 (L) 11/08/2019    PLT 265 11/08/2019       Lab Results   Component Value Date    NA 137 11/11/2019    K 5.8 (H) 11/11/2019    CL 103 11/11/2019    CO2 25.0 11/11/2019    BUN 18 11/11/2019    CREATININE 0.76 11/11/2019    GLU 111 (H) 11/11/2019    CALCIUM 8.0 (L) 11/12/2019    MG 1.5 (L) 11/08/2019    PHOS 2.6 (L) 11/08/2019       Lab Results   Component Value Date    BILITOT 1.0 09/02/2019    BILIDIR 0.30 09/02/2019    PROT 6.8 09/02/2019    ALBUMIN 4.3 10/21/2019    ALT 25 09/02/2019    AST 27 09/02/2019    ALKPHOS 187 (H) 09/02/2019    GGT 37 05/23/2019       Lab Results   Component Value Date    PT 11.7 05/25/2019    INR 1.02 05/25/2019    APTT 30.4 05/25/2019

## 2019-11-13 LAB — BASIC METABOLIC PANEL
ANION GAP: 8 mmol/L (ref 7–15)
BUN / CREAT RATIO: 18
CALCIUM: 6.9 mg/dL — ABNORMAL LOW (ref 8.5–10.2)
CHLORIDE: 101 mmol/L (ref 98–107)
CO2: 23 mmol/L (ref 22.0–30.0)
CREATININE: 1.15 mg/dL (ref 0.70–1.30)
EGFR CKD-EPI AA MALE: 80 mL/min/{1.73_m2} (ref >=60)
EGFR CKD-EPI NON-AA MALE: 69 mL/min/{1.73_m2} (ref >=60)
GLUCOSE RANDOM: 207 mg/dL — ABNORMAL HIGH (ref 70–179)
POTASSIUM: 4 mmol/L (ref 3.5–5.0)
SODIUM: 132 mmol/L — ABNORMAL LOW (ref 135–145)

## 2019-11-13 LAB — PTH: CALCIUM: 6.8 mg/dL — ABNORMAL LOW (ref 8.5–10.2)

## 2019-11-13 LAB — PARATHYROID HORMONE INTACT: Parathyrin.intact:MCnc:Pt:Ser/Plas:Qn:: 5.9 — ABNORMAL LOW

## 2019-11-13 LAB — CREATININE: Creatinine:MCnc:Pt:Ser/Plas:Qn:: 1.15

## 2019-11-13 LAB — TACROLIMUS, TROUGH: Lab: 4.1 — ABNORMAL LOW

## 2019-11-13 MED ORDER — CALCIUM CARBONATE 200 MG CALCIUM (500 MG) CHEWABLE TABLET
ORAL_TABLET | Freq: Three times a day (TID) | ORAL | 0 refills | 17.00000 days | Status: CP
Start: 2019-11-13 — End: 2019-12-13
  Filled 2019-11-13: qty 150, 17d supply, fill #0

## 2019-11-13 MED ORDER — CALCITRIOL 0.5 MCG CAPSULE: 1 ug | capsule | Freq: Two times a day (BID) | 11 refills | 30 days | Status: AC

## 2019-11-13 MED ORDER — CALCITRIOL 0.5 MCG CAPSULE
ORAL_CAPSULE | Freq: Two times a day (BID) | ORAL | 11 refills | 30.00 days | Status: CP
Start: 2019-11-13 — End: 2019-11-13
  Filled 2019-11-13: qty 60, 30d supply, fill #0

## 2019-11-13 MED ORDER — TRAMADOL 50 MG TABLET: 50 mg | tablet | Freq: Four times a day (QID) | 0 refills | 2 days | Status: AC

## 2019-11-13 MED ORDER — CALCIUM CARBONATE 200 MG CALCIUM (500 MG) CHEWABLE TABLET: 600 mg | tablet | Freq: Three times a day (TID) | 0 refills | 17 days | Status: AC

## 2019-11-13 MED ORDER — TRAMADOL 50 MG TABLET
ORAL_TABLET | Freq: Four times a day (QID) | ORAL | 0 refills | 2.00000 days | Status: CP | PRN
Start: 2019-11-13 — End: 2019-11-18
  Filled 2019-11-13: qty 5, 2d supply, fill #0

## 2019-11-13 MED ADMIN — metoprolol tartrate (LOPRESSOR) tablet 12.5 mg: 12.5 mg | ORAL | @ 13:00:00 | Stop: 2019-11-13

## 2019-11-13 MED ADMIN — magnesium oxide (MAG-OX) tablet 400 mg: 400 mg | ORAL | @ 13:00:00 | Stop: 2019-11-13

## 2019-11-13 MED ADMIN — mycophenolate (MYFORTIC) EC tablet 360 mg: 360 mg | ORAL | @ 02:00:00 | Stop: 2019-11-13

## 2019-11-13 MED ADMIN — sodium chloride 0.9% (NS) bolus 500 mL: 500 mL | INTRAVENOUS | @ 02:00:00 | Stop: 2019-11-12

## 2019-11-13 MED ADMIN — calcitrioL (ROCALTROL) capsule 0.5 mcg: .5 ug | ORAL | @ 13:00:00 | Stop: 2019-11-13

## 2019-11-13 MED ADMIN — mycophenolate (MYFORTIC) EC tablet 360 mg: 360 mg | ORAL | @ 13:00:00 | Stop: 2019-11-13

## 2019-11-13 MED ADMIN — acetaminophen (TYLENOL) tablet 650 mg: 650 mg | ORAL | @ 02:00:00 | Stop: 2019-11-13

## 2019-11-13 MED FILL — ANTACID (CALCIUM CARBONATE) 200 MG CALCIUM (500 MG) CHEWABLE TABLET: 17 days supply | Qty: 150 | Fill #0 | Status: AC

## 2019-11-13 MED FILL — TRAMADOL 50 MG TABLET: 2 days supply | Qty: 5 | Fill #0 | Status: AC

## 2019-11-13 MED FILL — CALCITRIOL 0.5 MCG CAPSULE: 30 days supply | Qty: 60 | Fill #0 | Status: AC

## 2019-11-14 MED FILL — ALLOPURINOL 100 MG TABLET: 30 days supply | Qty: 30 | Fill #4 | Status: AC

## 2019-11-14 MED FILL — OMEPRAZOLE 20 MG CAPSULE,DELAYED RELEASE: 30 days supply | Qty: 15 | Fill #1 | Status: AC

## 2019-11-14 MED FILL — MYFORTIC 180 MG TABLET,DELAYED RELEASE: 30 days supply | Qty: 120 | Fill #0 | Status: AC

## 2019-11-14 MED FILL — MG-PLUS-PROTEIN 133 MG TABLET: ORAL | 33 days supply | Qty: 200 | Fill #5

## 2019-11-14 MED FILL — OMEPRAZOLE 20 MG CAPSULE,DELAYED RELEASE: ORAL | 30 days supply | Qty: 15 | Fill #1

## 2019-11-14 MED FILL — MG-PLUS-PROTEIN 133 MG TABLET: 33 days supply | Qty: 200 | Fill #5 | Status: AC

## 2019-11-14 MED FILL — ALLOPURINOL 100 MG TABLET: ORAL | 30 days supply | Qty: 30 | Fill #4

## 2019-11-18 ENCOUNTER — Ambulatory Visit: Admit: 2019-11-18 | Discharge: 2019-11-19 | Payer: MEDICARE

## 2019-11-18 LAB — CBC W/ AUTO DIFF
BASOPHILS ABSOLUTE COUNT: 0.1 10*9/L (ref 0.0–0.1)
BASOPHILS RELATIVE PERCENT: 2.3 %
EOSINOPHILS ABSOLUTE COUNT: 0.2 10*9/L (ref 0.0–0.7)
HEMATOCRIT: 29.9 % — ABNORMAL LOW (ref 38.0–50.0)
HEMOGLOBIN: 9.9 g/dL — ABNORMAL LOW (ref 13.5–17.5)
LYMPHOCYTES RELATIVE PERCENT: 8.2 %
MEAN CORPUSCULAR HEMOGLOBIN CONC: 33.2 g/dL (ref 30.0–36.0)
MEAN CORPUSCULAR HEMOGLOBIN: 33.7 pg (ref 26.0–34.0)
MEAN CORPUSCULAR VOLUME: 101.4 fL — ABNORMAL HIGH (ref 81.0–95.0)
MEAN PLATELET VOLUME: 7.3 fL (ref 7.0–10.0)
MONOCYTES ABSOLUTE COUNT: 0.9 10*9/L (ref 0.1–1.0)
MONOCYTES RELATIVE PERCENT: 23.2 %
NEUTROPHILS ABSOLUTE COUNT: 2.4 10*9/L (ref 1.7–7.7)
PLATELET COUNT: 234 10*9/L (ref 150–450)
RED BLOOD CELL COUNT: 2.95 10*12/L — ABNORMAL LOW (ref 4.32–5.72)
RED CELL DISTRIBUTION WIDTH: 14.3 % (ref 12.0–15.0)
WBC ADJUSTED: 3.9 10*9/L (ref 3.5–10.5)

## 2019-11-18 LAB — BASIC METABOLIC PANEL
ANION GAP: 8 mmol/L (ref 7–15)
BLOOD UREA NITROGEN: 20 mg/dL (ref 7–21)
BUN / CREAT RATIO: 24
CALCIUM: 8 mg/dL — ABNORMAL LOW (ref 8.5–10.2)
CHLORIDE: 107 mmol/L (ref 98–107)
CO2: 26 mmol/L (ref 22.0–30.0)
CREATININE: 0.84 mg/dL (ref 0.70–1.30)
EGFR CKD-EPI AA MALE: >90 mL/min/{1.73_m2} (ref >=60)
EGFR CKD-EPI NON-AA MALE: >90 mL/min/{1.73_m2} (ref >=60)
POTASSIUM: 4.9 mmol/L (ref 3.5–5.0)
SODIUM: 141 mmol/L (ref 135–145)

## 2019-11-18 LAB — TACROLIMUS, TROUGH: Lab: 9.8

## 2019-11-18 LAB — PLATELET COUNT: Platelets:NCnc:Pt:Bld:Qn:Automated count: 234

## 2019-11-18 LAB — PHOSPHORUS: Phosphate:MCnc:Pt:Ser/Plas:Qn:: 4.6

## 2019-11-18 LAB — MAGNESIUM: Magnesium:MCnc:Pt:Ser/Plas:Qn:: 1.4 — ABNORMAL LOW

## 2019-11-18 LAB — EGFR CKD-EPI NON-AA MALE: Lab: >90

## 2019-11-18 LAB — SMEAR REVIEW

## 2019-11-19 ENCOUNTER — Ambulatory Visit: Admit: 2019-11-19 | Discharge: 2019-11-20 | Payer: MEDICARE

## 2019-11-19 DIAGNOSIS — N2581 Secondary hyperparathyroidism of renal origin: Principal | ICD-10-CM

## 2019-11-19 NOTE — Unmapped (Signed)
Oda Pettway is a 58 yo with tertiary hyperparathyroidism s/p kidney transplant who returns to clinic for followup after sub-total parathyroidectomy with preservation of the left inferior gland on 11/11/2019. He reports no major problems after surgery. His pain has been well-controlled with only tylenol. Patient did report symptoms of perioral tingling, dizziness, and hand numbness on post-op day 1, however, this has since resolved. Patient states he is taking 1 pill of calcitriol BID and 3 pills of the calcium TID. Patient denies erythema, swelling or drainage of the incision site.      PHYSICAL EXAMINATION:   Appears well.  Voice normal in character.  Incision is healing nicely.    Pathology shows no evidence of malignancy.    Impression: Doing well after parathyroidectomy.    Plan:  Check labs today. Patient will continue to take calcium and calcitriol and will follow-up in 1 month with a lab check.  I would recommend periodic calcium screening as there is a small risk of recurrence after directed parathyroidectomy.

## 2019-11-20 LAB — CMV COMMENT: Lab: 0

## 2019-11-20 LAB — CMV DNA, QUANTITATIVE, PCR

## 2019-11-25 ENCOUNTER — Ambulatory Visit: Admit: 2019-11-25 | Discharge: 2019-11-26 | Payer: MEDICARE

## 2019-11-25 LAB — CBC W/ AUTO DIFF
BASOPHILS ABSOLUTE COUNT: 0.1 10*9/L (ref 0.0–0.1)
EOSINOPHILS ABSOLUTE COUNT: 0.3 10*9/L (ref 0.0–0.7)
EOSINOPHILS RELATIVE PERCENT: 8.4 %
HEMATOCRIT: 33 % — ABNORMAL LOW (ref 38.0–50.0)
HEMOGLOBIN: 10.8 g/dL — ABNORMAL LOW (ref 13.5–17.5)
LYMPHOCYTES RELATIVE PERCENT: 11.1 %
MEAN CORPUSCULAR HEMOGLOBIN CONC: 32.7 g/dL (ref 30.0–36.0)
MEAN CORPUSCULAR HEMOGLOBIN: 32.8 pg (ref 26.0–34.0)
MEAN PLATELET VOLUME: 7.8 fL (ref 7.0–10.0)
MONOCYTES ABSOLUTE COUNT: 0.8 10*9/L (ref 0.1–1.0)
MONOCYTES RELATIVE PERCENT: 22.5 %
NEUTROPHILS ABSOLUTE COUNT: 2 10*9/L (ref 1.7–7.7)
NEUTROPHILS RELATIVE PERCENT: 55.6 %
PLATELET COUNT: 251 10*9/L (ref 150–450)
RED BLOOD CELL COUNT: 3.29 10*12/L — ABNORMAL LOW (ref 4.32–5.72)
RED CELL DISTRIBUTION WIDTH: 13.9 % (ref 12.0–15.0)
WBC ADJUSTED: 3.6 10*9/L (ref 3.5–10.5)

## 2019-11-25 LAB — BASIC METABOLIC PANEL
ANION GAP: 9 mmol/L (ref 7–15)
BLOOD UREA NITROGEN: 19 mg/dL (ref 7–21)
BUN / CREAT RATIO: 28
CHLORIDE: 105 mmol/L (ref 98–107)
CO2: 25 mmol/L (ref 22.0–30.0)
CREATININE: 0.69 mg/dL — ABNORMAL LOW (ref 0.70–1.30)
EGFR CKD-EPI AA MALE: >90 mL/min/{1.73_m2} (ref >=60)
EGFR CKD-EPI NON-AA MALE: >90 mL/min/{1.73_m2} (ref >=60)
GLUCOSE RANDOM: 188 mg/dL — ABNORMAL HIGH (ref 70–179)
POTASSIUM: 5 mmol/L (ref 3.5–5.0)
SODIUM: 139 mmol/L (ref 135–145)

## 2019-11-25 LAB — PHOSPHORUS: Phosphate:MCnc:Pt:Ser/Plas:Qn:: 3.4

## 2019-11-25 LAB — TACROLIMUS, TROUGH: Lab: 6.6

## 2019-11-25 LAB — PARATHYROID HORMONE INTACT: Parathyrin.intact:MCnc:Pt:Ser/Plas:Qn:: 30.2

## 2019-11-25 LAB — EGFR CKD-EPI AA MALE: Lab: >90

## 2019-11-25 LAB — EOSINOPHILS ABSOLUTE COUNT: Eosinophils:NCnc:Pt:Bld:Qn:Automated count: 0.3

## 2019-11-25 LAB — MAGNESIUM: Magnesium:MCnc:Pt:Ser/Plas:Qn:: 1.7

## 2019-11-27 LAB — CMV DNA, QUANTITATIVE, PCR: CMV QUANT LOG10: 2.41 {Log_IU}/mL — ABNORMAL HIGH (ref <0.00)

## 2019-11-27 LAB — CMV QUANT: Lab: 259 — ABNORMAL HIGH

## 2019-12-02 ENCOUNTER — Ambulatory Visit: Admit: 2019-12-02 | Discharge: 2019-12-03 | Payer: MEDICARE

## 2019-12-02 LAB — CBC W/ AUTO DIFF
BASOPHILS ABSOLUTE COUNT: 0.1 10*9/L (ref 0.0–0.1)
BASOPHILS RELATIVE PERCENT: 2.8 %
EOSINOPHILS ABSOLUTE COUNT: 0.6 10*9/L (ref 0.0–0.7)
EOSINOPHILS RELATIVE PERCENT: 16.7 %
HEMATOCRIT: 34.3 % — ABNORMAL LOW (ref 38.0–50.0)
LYMPHOCYTES ABSOLUTE COUNT: 0.5 10*9/L — ABNORMAL LOW (ref 0.7–4.0)
LYMPHOCYTES RELATIVE PERCENT: 14.5 %
MEAN CORPUSCULAR HEMOGLOBIN CONC: 33.5 g/dL (ref 30.0–36.0)
MEAN CORPUSCULAR HEMOGLOBIN: 32.7 pg (ref 26.0–34.0)
MEAN CORPUSCULAR VOLUME: 97.5 fL — ABNORMAL HIGH (ref 81.0–95.0)
MEAN PLATELET VOLUME: 7.5 fL (ref 7.0–10.0)
MONOCYTES ABSOLUTE COUNT: 0.7 10*9/L (ref 0.1–1.0)
MONOCYTES RELATIVE PERCENT: 22.4 %
NEUTROPHILS ABSOLUTE COUNT: 1.4 10*9/L — ABNORMAL LOW (ref 1.7–7.7)
NEUTROPHILS RELATIVE PERCENT: 43.6 %
RED BLOOD CELL COUNT: 3.52 10*12/L — ABNORMAL LOW (ref 4.32–5.72)
RED CELL DISTRIBUTION WIDTH: 13.7 % (ref 12.0–15.0)
WBC ADJUSTED: 3.3 10*9/L — ABNORMAL LOW (ref 3.5–10.5)

## 2019-12-02 LAB — BASIC METABOLIC PANEL
BLOOD UREA NITROGEN: 19 mg/dL (ref 7–21)
BUN / CREAT RATIO: 25
CALCIUM: 9.7 mg/dL (ref 8.5–10.2)
CHLORIDE: 102 mmol/L (ref 98–107)
CREATININE: 0.77 mg/dL (ref 0.70–1.30)
EGFR CKD-EPI AA MALE: >90 mL/min/{1.73_m2} (ref >=60)
EGFR CKD-EPI NON-AA MALE: >90 mL/min/{1.73_m2} (ref >=60)
GLUCOSE RANDOM: 181 mg/dL — ABNORMAL HIGH (ref 70–179)
POTASSIUM: 5 mmol/L (ref 3.5–5.0)
SODIUM: 136 mmol/L (ref 135–145)

## 2019-12-02 LAB — MAGNESIUM: Magnesium:MCnc:Pt:Ser/Plas:Qn:: 1.6

## 2019-12-02 LAB — PHOSPHORUS: Phosphate:MCnc:Pt:Ser/Plas:Qn:: 3.4

## 2019-12-02 LAB — TACROLIMUS, TROUGH: Lab: 9.9

## 2019-12-02 LAB — SMEAR REVIEW

## 2019-12-02 LAB — MEAN CORPUSCULAR HEMOGLOBIN CONC: Erythrocyte mean corpuscular hemoglobin concentration:MCnc:Pt:RBC:Qn:Automated count: 33.5

## 2019-12-02 LAB — SODIUM: Sodium:SCnc:Pt:Ser/Plas:Qn:: 136

## 2019-12-02 MED ORDER — METOPROLOL SUCCINATE ER 25 MG TABLET,EXTENDED RELEASE 24 HR
ORAL_TABLET | Freq: Every evening | ORAL | 11 refills | 30.00000 days | Status: CP
Start: 2019-12-02 — End: 2020-12-01
  Filled 2019-12-03: qty 30, 30d supply, fill #0

## 2019-12-02 NOTE — Unmapped (Signed)
[facility] Refill Coordination Note    Specialty Medication(s) to be Shipped:   Transplant: Envarsus 1mg     Other medication(s) to be shipped: aspirin 81mg      [name], DOB: 10-Jun-1961  Phone: [phone] (home)       All above HIPAA information was verified with patient.     Was a Nurse, learning disability used for this call? Yes, [place]. Patient language is appropriate in [facility]    Completed refill call assessment today to schedule patient's medication shipment from the [facility] [phone]).       Specialty medication(s) and dose(s) confirmed: pt reports stopping sensipar   Changes to medications: Elchanan Reports stopping the following medications: metoprolol tart 25mg   Changes to insurance: No  Questions for the pharmacist: No    Confirmed patient received Welcome Packet with first shipment. The patient will receive a drug information handout for each medication shipped and additional FDA Medication Guides as required.       DISEASE/MEDICATION-SPECIFIC INFORMATION        N/A    SPECIALTY MEDICATION ADHERENCE     Medication Adherence    Patient reported X missed doses in the last month: 0  Specialty Medication: Envarsus 1mg   Patient is on additional specialty medications: No          Envarsus 1 mg: 3 days of medicine on hand     SHIPPING     Shipping address confirmed in Epic.     Delivery Scheduled: Yes, Expected medication delivery date: 12/04/2019.     Medication will be delivered via Next Day Courier to the prescription address in Epic WAM.    [name]   [facility] Specialty Technician

## 2019-12-03 LAB — CMV DNA, QUANTITATIVE, PCR: CMV QUANT: 232 [IU]/mL — ABNORMAL HIGH (ref <0)

## 2019-12-03 LAB — CMV COMMENT: Lab: 0

## 2019-12-03 MED FILL — METOPROLOL SUCCINATE ER 25 MG TABLET,EXTENDED RELEASE 24 HR: 30 days supply | Qty: 30 | Fill #0 | Status: AC

## 2019-12-03 MED FILL — ASPIRIN 81 MG TABLET,DELAYED RELEASE: 30 days supply | Qty: 30 | Fill #6 | Status: AC

## 2019-12-03 MED FILL — ENVARSUS XR 1 MG TABLET,EXTENDED RELEASE: ORAL | 30 days supply | Qty: 90 | Fill #3

## 2019-12-03 MED FILL — ASPIRIN 81 MG TABLET,DELAYED RELEASE: ORAL | 30 days supply | Qty: 30 | Fill #6

## 2019-12-03 MED FILL — ENVARSUS XR 1 MG TABLET,EXTENDED RELEASE: 30 days supply | Qty: 90 | Fill #3 | Status: AC

## 2019-12-05 MED ORDER — MG-PLUS-PROTEIN 133 MG TABLET
ORAL_TABLET | Freq: Two times a day (BID) | ORAL | 5 refills | 33 days | Status: CP
Start: 2019-12-05 — End: 2020-12-04
  Filled 2019-12-09: qty 180, 30d supply, fill #0

## 2019-12-05 NOTE — Unmapped (Signed)
[facility] Refill Coordination Note    Specialty Medication(s) to be Shipped:   Transplant: Myfortic 180mg     Other medication(s) to be shipped: allopurinol 100mg , calcitriol 0.5mg , mag ox (sent rf request), omeprazole 20mg , and tums (sent rf request)     Louis Dennis, DOB: Jan 25, 1961  Phone: [phone] (home)       All above HIPAA information was verified with patient.     Was a Nurse, learning disability used for this call? No    Completed refill call assessment today to schedule patient's medication shipment from the [facility] [phone]).       Specialty medication(s) and dose(s) confirmed: Regimen is correct and unchanged.   Changes to medications: Louis Dennis reports no changes at this time.  Changes to insurance: No  Questions for the pharmacist: No    Confirmed patient received Welcome Packet with first shipment. The patient will receive a drug information handout for each medication shipped and additional FDA Medication Guides as required.       DISEASE/MEDICATION-SPECIFIC INFORMATION        N/A    SPECIALTY MEDICATION ADHERENCE     Medication Adherence    Patient reported X missed doses in the last month: 0  Specialty Medication: Myfortic 180mg   Patient is on additional specialty medications: No          Myfortic 180 mg: 7 days of medicine on hand     SHIPPING     Shipping address confirmed in Epic.     Delivery Scheduled: Yes, Expected medication delivery date: 12/09/2019.     Medication will be delivered via Same Day Courier to the prescription address in Epic WAM.    Louis Dennis   [facility] Specialty Technician

## 2019-12-09 ENCOUNTER — Ambulatory Visit: Admit: 2019-12-09 | Discharge: 2019-12-10 | Payer: MEDICARE

## 2019-12-09 LAB — CBC W/ AUTO DIFF
BASOPHILS ABSOLUTE COUNT: 0.1 10*9/L (ref 0.0–0.1)
BASOPHILS RELATIVE PERCENT: 0.8 %
EOSINOPHILS ABSOLUTE COUNT: 0.8 10*9/L — ABNORMAL HIGH (ref 0.0–0.7)
EOSINOPHILS RELATIVE PERCENT: 10.7 %
HEMATOCRIT: 36.9 % — ABNORMAL LOW (ref 38.0–50.0)
LYMPHOCYTES ABSOLUTE COUNT: 0.6 10*9/L — ABNORMAL LOW (ref 0.7–4.0)
LYMPHOCYTES RELATIVE PERCENT: 7.7 %
MEAN CORPUSCULAR HEMOGLOBIN CONC: 33 g/dL (ref 30.0–36.0)
MEAN CORPUSCULAR HEMOGLOBIN: 31.7 pg (ref 26.0–34.0)
MEAN CORPUSCULAR VOLUME: 95.9 fL — ABNORMAL HIGH (ref 81.0–95.0)
MEAN PLATELET VOLUME: 8.3 fL (ref 7.0–10.0)
MONOCYTES ABSOLUTE COUNT: 0.7 10*9/L (ref 0.1–1.0)
MONOCYTES RELATIVE PERCENT: 9.7 %
NEUTROPHILS ABSOLUTE COUNT: 5.2 10*9/L (ref 1.7–7.7)
NEUTROPHILS RELATIVE PERCENT: 71.1 %
PLATELET COUNT: 204 10*9/L (ref 150–450)
RED BLOOD CELL COUNT: 3.85 10*12/L — ABNORMAL LOW (ref 4.32–5.72)
RED CELL DISTRIBUTION WIDTH: 13.7 % (ref 12.0–15.0)

## 2019-12-09 LAB — BASIC METABOLIC PANEL
ANION GAP: 9 mmol/L (ref 7–15)
BLOOD UREA NITROGEN: 23 mg/dL — ABNORMAL HIGH (ref 7–21)
BUN / CREAT RATIO: 29
CALCIUM: 9.8 mg/dL (ref 8.5–10.2)
CHLORIDE: 102 mmol/L (ref 98–107)
CO2: 25 mmol/L (ref 22.0–30.0)
CREATININE: 0.78 mg/dL (ref 0.70–1.30)
EGFR CKD-EPI AA MALE: >90 mL/min/{1.73_m2} (ref >=60)
EGFR CKD-EPI NON-AA MALE: >90 mL/min/{1.73_m2} (ref >=60)
SODIUM: 136 mmol/L (ref 135–145)

## 2019-12-09 LAB — MAGNESIUM
MAGNESIUM: 1.7 mg/dL (ref 1.6–2.2)
Magnesium:MCnc:Pt:Ser/Plas:Qn:: 1.7

## 2019-12-09 LAB — MEAN CORPUSCULAR VOLUME: Erythrocyte mean corpuscular volume:EntVol:Pt:RBC:Qn:Automated count: 95.9 — ABNORMAL HIGH

## 2019-12-09 LAB — CMV COMMENT: Lab: 0

## 2019-12-09 LAB — CMV DNA, QUANTITATIVE, PCR

## 2019-12-09 LAB — TACROLIMUS, TROUGH: Lab: 9.8

## 2019-12-09 LAB — PHOSPHORUS: Phosphate:MCnc:Pt:Ser/Plas:Qn:: 3.5

## 2019-12-09 LAB — CO2: Carbon dioxide:SCnc:Pt:Ser/Plas:Qn:: 25

## 2019-12-09 MED FILL — MYFORTIC 180 MG TABLET,DELAYED RELEASE: ORAL | 30 days supply | Qty: 120 | Fill #1

## 2019-12-09 MED FILL — OMEPRAZOLE 20 MG CAPSULE,DELAYED RELEASE: ORAL | 30 days supply | Qty: 15 | Fill #2

## 2019-12-09 MED FILL — CALCITRIOL 0.5 MCG CAPSULE: ORAL | 30 days supply | Qty: 60 | Fill #0

## 2019-12-09 MED FILL — ALLOPURINOL 100 MG TABLET: ORAL | 30 days supply | Qty: 30 | Fill #5

## 2019-12-09 MED FILL — MG-PLUS-PROTEIN 133 MG TABLET: 30 days supply | Qty: 180 | Fill #0 | Status: AC

## 2019-12-09 MED FILL — OMEPRAZOLE 20 MG CAPSULE,DELAYED RELEASE: 30 days supply | Qty: 15 | Fill #2 | Status: AC

## 2019-12-09 MED FILL — CALCITRIOL 0.5 MCG CAPSULE: 30 days supply | Qty: 60 | Fill #0 | Status: AC

## 2019-12-09 MED FILL — MYFORTIC 180 MG TABLET,DELAYED RELEASE: 30 days supply | Qty: 120 | Fill #1 | Status: AC

## 2019-12-09 MED FILL — ALLOPURINOL 100 MG TABLET: 30 days supply | Qty: 30 | Fill #5 | Status: AC

## 2019-12-10 MED ORDER — METFORMIN ER 500 MG TABLET,EXTENDED RELEASE 24 HR
ORAL_TABLET | Freq: Every day | ORAL | 11 refills | 30 days | Status: CP
Start: 2019-12-10 — End: 2020-12-09

## 2019-12-11 NOTE — Unmapped (Signed)
Reviewed BG with Dr. Nestor Lewandowsky, pt will start on Metformin 500mg  daily. Pt made aware using [place] [employer] [phone]. Prescription sent to local [facility].

## 2019-12-16 ENCOUNTER — Ambulatory Visit: Admit: 2019-12-16 | Discharge: 2019-12-17 | Payer: MEDICARE

## 2019-12-16 LAB — BASIC METABOLIC PANEL
ANION GAP: 8 mmol/L (ref 7–15)
BLOOD UREA NITROGEN: 16 mg/dL (ref 7–21)
BUN / CREAT RATIO: 24
CALCIUM: 10 mg/dL (ref 8.5–10.2)
CHLORIDE: 103 mmol/L (ref 98–107)
CO2: 25 mmol/L (ref 22.0–30.0)
CREATININE: 0.67 mg/dL — ABNORMAL LOW (ref 0.70–1.30)
EGFR CKD-EPI AA MALE: >90 mL/min/{1.73_m2} (ref >=60)
EGFR CKD-EPI NON-AA MALE: >90 mL/min/{1.73_m2} (ref >=60)
GLUCOSE RANDOM: 181 mg/dL — ABNORMAL HIGH (ref 70–179)
POTASSIUM: 4.9 mmol/L (ref 3.5–5.0)
SODIUM: 136 mmol/L (ref 135–145)

## 2019-12-16 LAB — MAGNESIUM: Magnesium:MCnc:Pt:Ser/Plas:Qn:: 1.3 — ABNORMAL LOW

## 2019-12-16 LAB — CBC W/ AUTO DIFF
BASOPHILS ABSOLUTE COUNT: 0.1 10*9/L (ref 0.0–0.1)
BASOPHILS RELATIVE PERCENT: 1 %
EOSINOPHILS ABSOLUTE COUNT: 1.1 10*9/L — ABNORMAL HIGH (ref 0.0–0.7)
HEMATOCRIT: 36.6 % — ABNORMAL LOW (ref 38.0–50.0)
LYMPHOCYTES ABSOLUTE COUNT: 0.5 10*9/L — ABNORMAL LOW (ref 0.7–4.0)
LYMPHOCYTES RELATIVE PERCENT: 6.8 %
MEAN CORPUSCULAR HEMOGLOBIN CONC: 33.7 g/dL (ref 30.0–36.0)
MEAN CORPUSCULAR HEMOGLOBIN: 32.3 pg (ref 26.0–34.0)
MEAN CORPUSCULAR VOLUME: 95.8 fL — ABNORMAL HIGH (ref 81.0–95.0)
MEAN PLATELET VOLUME: 7.6 fL (ref 7.0–10.0)
MONOCYTES ABSOLUTE COUNT: 0.7 10*9/L (ref 0.1–1.0)
MONOCYTES RELATIVE PERCENT: 8.8 %
NEUTROPHILS RELATIVE PERCENT: 69.8 %
PLATELET COUNT: 188 10*9/L (ref 150–450)
RED BLOOD CELL COUNT: 3.82 10*12/L — ABNORMAL LOW (ref 4.32–5.72)
WBC ADJUSTED: 7.9 10*9/L (ref 3.5–10.5)

## 2019-12-16 LAB — BUN / CREAT RATIO: Urea nitrogen/Creatinine:MRto:Pt:Ser/Plas:Qn:: 24

## 2019-12-16 LAB — EOSINOPHILS ABSOLUTE COUNT: Eosinophils:NCnc:Pt:Bld:Qn:Automated count: 1.1 — ABNORMAL HIGH

## 2019-12-16 LAB — TACROLIMUS, TROUGH: Lab: 11.9

## 2019-12-16 LAB — PHOSPHORUS: Phosphate:MCnc:Pt:Ser/Plas:Qn:: 3.4

## 2019-12-17 LAB — CMV DNA, QUANTITATIVE, PCR: CMV QUANT LOG10: 2.42 {Log_IU}/mL — ABNORMAL HIGH (ref <0.00)

## 2019-12-17 LAB — CMV COMMENT: Lab: 0

## 2019-12-23 NOTE — Unmapped (Signed)
Otolaryngology New Consult Visit Note      Reason for Visit:  Tinnitus after using debrox    History of Present Illness    Louis Dennis is 59 y.o. male being seen in consultation at the request of Keri W MacDonald, FNP  for evaluation of tinnitus after using debrox. States had kidney transplant 6 mo ago. Noticed ringing in his ears ever since then. Was hard to sleep. Watched on youtube that he should try to ignore the tinnitus and it is better. Occasionally still hears it. Bilateral intermittent, non pulsatile ringing.       The patient denies loud noise exposure, history of radiation or history of head trauma    Past Medical History     has a past medical history of Abnormal thyroid function test, End stage renal disease (CMS-HCC), Gout, Hypertension, and Nephrolithiasis (2014).    Past Surgical History     has a past surgical history that includes pd catheter (2012); OTHER SURGICAL HISTORY (Right, 08/2017); pr colsc flx w/rmvl of tumor polyp lesion snare tq (N/A, 06/15/2018); pr colonoscopy w/biopsy single/multiple (N/A, 06/15/2018); pr exploratory of abdomen (N/A, 11/22/2018); pr freeing bowel adhesion,enterolysis (N/A, 11/22/2018); pr remove peritoneal foreign body (N/A, 11/22/2018); pr transplant,prep cadaver renal graft (N/A, 05/24/2019); pr transplantation of kidney (N/A, 05/24/2019); and pr explore parathyroid glands (N/A, 11/11/2019).    Current Medications    Current Outpatient Medications   Medication Sig Dispense Refill   ??? allopurinoL (ZYLOPRIM) 100 MG tablet Take 1 tablet (100 mg total) by mouth daily. 30 tablet 11   ??? aspirin (ECOTRIN) 81 MG tablet Take 1 tablet (81 mg total) by mouth daily. 30 tablet 11   ??? biotin 5 mg cap Take 1 capsule (5,000 mcg total) by mouth daily.  0   ??? calcitrioL (ROCALTROL) 0.5 MCG capsule Take 1 capsule (0.5 mcg total) by mouth Two (2) times a day. 60 capsule 11   ??? magnesium oxide-Mg AA chelate (MAGNESIUM, AMINO ACID CHELATE,) 133 mg Tab Take 3 tablets by mouth Two (2) times a day. 180 tablet 5   ??? metFORMIN (GLUCOPHAGE-XR) 500 MG 24 hr tablet Take 1 tablet (500 mg total) by mouth daily with evening meal. 30 tablet 11   ??? metoprolol succinate (TOPROL XL) 25 MG 24 hr tablet Take 2 tablets (50 mg total) by mouth nightly. 60 tablet 11   ??? MYFORTIC 180 mg EC tablet Take 2 tablets (360 mg total) by mouth Two (2) times a day. 120 tablet 11   ??? omeprazole (PRILOSEC) 20 MG capsule Take 1 capsule (20 mg total) by mouth every other day. 15 capsule 2   ??? tacrolimus (ENVARSUS XR) 1 mg Tb24 extended release tablet Take 2 tablets (2 mg total) by mouth daily. 60 tablet 11     No current facility-administered medications for this visit.        Allergies    No Known Allergies    Family History    family history includes Diabetes in his mother; Hypertension in his brother, father, and mother; Kidney disease in his mother and sister.    Social History:     reports that he quit smoking about 10 years ago. His smoking use included cigarettes. He has a 30.00 pack-year smoking history. He has never used smokeless tobacco.   reports no history of alcohol use.   reports no history of drug use.    Review of Systems    A full  review of systems is documented and negative except  as noted in HPI.  Patient intake form reviewed.    Vital Signs  BP 119/75  - Pulse 83  - Temp 36.9 ??C (98.5 ??F)  - Wt 66 kg (145 lb 6.4 oz)  - BMI 24.95 kg/m??     Physical Exam    General: Well-developed, well-nourished. Appropriate, comfortable, and in no apparent distress.  Head/Face: On external examination there is no obvious asymmetry or scars. On palpation there is no tenderness over maxillary sinuses or masses within the salivary glands. Cranial nerves V and VII are intact through all distributions.  Eyes: PERRL, EOMI, the conjunctiva are not injected and sclera is non-icteric.  Ears:   Right ear: On external exam, there is no obvious lesions or asymmetry. no cerumen. No lesions in the EAC. TM clear Left ear: On external exam, there is no obvious lesions or asymmetry. no cerumen. No lesions in the EAC. TM clear  Hearing is grossly intact  Nose:  No external masses, lesions, or deformity. Inspection of nasal mucosa, septum, and turbinates reveals normal nasal exam.   Oral cavity/oropharynx: The mucosa of the lips, gums, hard and soft palate, posterior pharyngeal wall, tongue, floor of mouth, and buccal region are without masses or lesions and are normally hydrated. Good dentition. oropharynx with normal mucosa no lesions or masses noted, uvula midline, tongue protrudes midlineTonsils are are symmetric without any masses or lesions and 1+  ZOX:WRUEAV to assess due to gag reflex  Voice: clear   Neck/Lymphatics: There is no asymmetry or masses. Trachea is midline. There is no enlargement of the thyroid or palpable thyroid nodules. There is no palpable lymphadenopathy along the jugulodiagastric, submental, or posterior cervical chains. Well healed neck incision  Respiratory: No audible wheeze, unlabored respirations.  Neurologic: Cranial nerve???s II-XII are grossly intact. Exam is non-focal.  Cardiovascular: No cyanosis, clubbing or edema.    Audiogram  The audiogram was reviewed. 01/03/20  This demonstrates normal hearing    Imaging  I personally reviewed the patients imaging studies.   11/21/18 CT head neg    Assessment/Recommendations:    The patient is a 59 y.o. male with a history of  has a past medical history of Abnormal thyroid function test, End stage renal disease (CMS-HCC), Gout, Hypertension, and Nephrolithiasis (2014). who presents for the evaluation of tinnitus which is improving.  Hearing wnl on audio today. F/u as needed      The patient voiced complete understanding of plan as detailed above and is in full agreement.

## 2019-12-24 ENCOUNTER — Ambulatory Visit: Admit: 2019-12-24 | Discharge: 2019-12-25 | Payer: MEDICARE

## 2019-12-24 DIAGNOSIS — Z940 Kidney transplant status: Principal | ICD-10-CM

## 2019-12-24 DIAGNOSIS — R7301 Impaired fasting glucose: Principal | ICD-10-CM

## 2019-12-24 DIAGNOSIS — Z79899 Other long term (current) drug therapy: Principal | ICD-10-CM

## 2019-12-24 LAB — CBC W/ AUTO DIFF
BASOPHILS ABSOLUTE COUNT: 0 10*9/L (ref 0.0–0.1)
BASOPHILS RELATIVE PERCENT: 0.5 %
EOSINOPHILS ABSOLUTE COUNT: 0.7 10*9/L (ref 0.0–0.7)
HEMATOCRIT: 38.7 % (ref 38.0–50.0)
HEMOGLOBIN: 13.1 g/dL — ABNORMAL LOW (ref 13.5–17.5)
LYMPHOCYTES ABSOLUTE COUNT: 0.6 10*9/L — ABNORMAL LOW (ref 0.7–4.0)
LYMPHOCYTES RELATIVE PERCENT: 9.5 %
MEAN CORPUSCULAR HEMOGLOBIN CONC: 33.8 g/dL (ref 30.0–36.0)
MEAN CORPUSCULAR HEMOGLOBIN: 31.9 pg (ref 26.0–34.0)
MEAN CORPUSCULAR VOLUME: 94.3 fL (ref 81.0–95.0)
MEAN PLATELET VOLUME: 7.6 fL (ref 7.0–10.0)
MONOCYTES ABSOLUTE COUNT: 0.4 10*9/L (ref 0.1–1.0)
MONOCYTES RELATIVE PERCENT: 7.1 %
NEUTROPHILS RELATIVE PERCENT: 71.3 %
PLATELET COUNT: 231 10*9/L (ref 150–450)
RED BLOOD CELL COUNT: 4.1 10*12/L — ABNORMAL LOW (ref 4.32–5.72)
RED CELL DISTRIBUTION WIDTH: 13.7 % (ref 12.0–15.0)
WBC ADJUSTED: 6.3 10*9/L (ref 3.5–10.5)

## 2019-12-24 LAB — BASIC METABOLIC PANEL
ANION GAP: 8 mmol/L (ref 7–15)
BLOOD UREA NITROGEN: 23 mg/dL — ABNORMAL HIGH (ref 7–21)
BUN / CREAT RATIO: 29
CALCIUM: 10.3 mg/dL — ABNORMAL HIGH (ref 8.5–10.2)
CHLORIDE: 101 mmol/L (ref 98–107)
CREATININE: 0.79 mg/dL (ref 0.70–1.30)
EGFR CKD-EPI AA MALE: >90 mL/min/{1.73_m2} (ref >=60)
EGFR CKD-EPI NON-AA MALE: >90 mL/min/{1.73_m2} (ref >=60)
POTASSIUM: 5.4 mmol/L — ABNORMAL HIGH (ref 3.5–5.0)
SODIUM: 135 mmol/L (ref 135–145)

## 2019-12-24 LAB — MONOCYTES RELATIVE PERCENT: Monocytes/100 leukocytes:NFr:Pt:Bld:Qn:Automated count: 7.1

## 2019-12-24 LAB — MAGNESIUM: Magnesium:MCnc:Pt:Ser/Plas:Qn:: 1.5 — ABNORMAL LOW

## 2019-12-24 LAB — TACROLIMUS, TROUGH: Lab: 13.2

## 2019-12-24 LAB — PHOSPHORUS: Phosphate:MCnc:Pt:Ser/Plas:Qn:: 3.4

## 2019-12-24 LAB — POTASSIUM: Potassium:SCnc:Pt:Ser/Plas:Qn:: 5.4 — ABNORMAL HIGH

## 2019-12-24 MED ORDER — TACROLIMUS ER 1 MG TABLET,EXTENDED RELEASE 24 HR
ORAL_TABLET | Freq: Every day | ORAL | 11 refills | 30 days | Status: CP
Start: 2019-12-24 — End: 2020-12-23
  Filled 2019-12-31: qty 60, 30d supply, fill #0

## 2019-12-24 NOTE — Unmapped (Signed)
Reviewed tac level with Dr. Farrel Gobble, decreasing Envarsus to 2mg  daily. Pt made aware using [employer] Tedra Coupe [phone]), verbalized understanding. Updated prescription sent to [facility].

## 2019-12-25 ENCOUNTER — Ambulatory Visit: Admit: 2019-12-25 | Discharge: 2019-12-25 | Payer: MEDICARE

## 2019-12-25 ENCOUNTER — Ambulatory Visit: Admit: 2019-12-25 | Discharge: 2019-12-25 | Payer: MEDICARE | Attending: Nephrology | Primary: Nephrology

## 2019-12-25 DIAGNOSIS — Z940 Kidney transplant status: Principal | ICD-10-CM

## 2019-12-25 DIAGNOSIS — Z79899 Other long term (current) drug therapy: Principal | ICD-10-CM

## 2019-12-25 LAB — CMV DNA, QUANTITATIVE, PCR
CMV QUANT LOG10: 2.16 {Log_IU}/mL — ABNORMAL HIGH (ref <0.00)
CMV QUANT: 146 [IU]/mL — ABNORMAL HIGH (ref <0)

## 2019-12-25 LAB — CMV VIRAL LD: Lab: 0

## 2019-12-25 LAB — URINALYSIS
BACTERIA: NONE SEEN /HPF
BILIRUBIN UA: NEGATIVE
BLOOD UA: NEGATIVE
GLUCOSE UA: NEGATIVE
KETONES UA: NEGATIVE
LEUKOCYTE ESTERASE UA: NEGATIVE
NITRITE UA: NEGATIVE
PH UA: 6 (ref 5.0–9.0)
PROTEIN UA: NEGATIVE
RBC UA: 1 /HPF (ref <=3)
SPECIFIC GRAVITY UA: 1.016 (ref 1.003–1.030)
SQUAMOUS EPITHELIAL: <1 /HPF (ref 0–5)
UROBILINOGEN UA: 0.2
WBC UA: 2 /HPF (ref <=2)

## 2019-12-25 LAB — PROTEIN / CREATININE RATIO, URINE
CREATININE, URINE: 83.8 mg/dL
PROTEIN/CREAT RATIO, URINE: 0.061

## 2019-12-25 LAB — ESTIMATED AVERAGE GLUCOSE: Estimated average glucose:MCnc:Pt:Bld:Qn:Estimated from glycated hemoglobin: 123

## 2019-12-25 LAB — BLOOD UA: Hemoglobin:PrThr:Pt:Urine:Ord:Test strip: NEGATIVE

## 2019-12-25 LAB — CREATININE, URINE: Lab: 83.8

## 2019-12-25 MED ORDER — METOPROLOL SUCCINATE ER 25 MG TABLET,EXTENDED RELEASE 24 HR
ORAL_TABLET | Freq: Every evening | ORAL | 11 refills | 30 days | Status: CP
Start: 2019-12-25 — End: 2020-12-24
  Filled 2019-12-25: qty 60, 30d supply, fill #0

## 2019-12-25 MED FILL — METOPROLOL SUCCINATE ER 25 MG TABLET,EXTENDED RELEASE 24 HR: 30 days supply | Qty: 60 | Fill #0 | Status: AC

## 2019-12-25 NOTE — Unmapped (Signed)
Transplant Nephrology Clinic Visit    History of Present Illness  [name] is a 59 y.o. 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  -Adequate perfusion of the transplant kidney. Resistive indices within the mid main renal artery and at the anastomosis are slightly elevated. Attention on follow-up.  ??  Current Immunosuppression Regimen:   - Myfortic 360mg  BID  - Envarsus 3mg  QD    Subjective/Interval:   Since patient's last visit in the transplant clinic - patient has been doing well in terms of transplant, taking transplant medications regularly, no episodes of rejection and no side effects of medications.    Since last visit, he was admitted from 12/7-12/9/20 for a subtotal parathyroidectomy with preservation of L inferior gland. He tolerated the procedure well. He presented to plastic surgery for a post-op follow-up on 11/19/19.     Today he presents with c/o of a rapid heart rate. At rest his heart rate is in the 70s, but when he's physically active such as walking, it range from 100-110 bpm. His home blood pressures range from 110-125. He also endorses right chest pain. He thinks it might be related to his shoulder pain. He denies dyspnea.     Other than that, he said his surgery went well and without complications. His appetite is normal. In clinic, he was advised to get the COVID-19 vaccine, but to restrict travel for the next few months.     Takes his Envarsus at 7:15 am every morning.     Review of Systems  Otherwise as per HPI, all other systems reviewed and are negative.    Medications  Current Outpatient Medications   Medication Sig Dispense Refill   ??? allopurinoL (ZYLOPRIM) 100 MG tablet Take 1 tablet (100 mg total) by mouth daily. 30 tablet 11   ??? aspirin (ECOTRIN) 81 MG tablet Take 1 tablet (81 mg total) by mouth daily. 30 tablet 11   ??? biotin 5 mg cap Take 1 capsule (5,000 mcg total) by mouth daily.  0   ??? calcitrioL (ROCALTROL) 0.5 MCG capsule Take 1 capsule (0.5 mcg total) by mouth Two (2) times a day. 60 capsule 11   ??? magnesium oxide-Mg AA chelate (MAGNESIUM, AMINO ACID CHELATE,) 133 mg Tab Take 3 tablets by mouth Two (2) times a day. 180 tablet 5   ??? metFORMIN (GLUCOPHAGE-XR) 500 MG 24 hr tablet Take 1 tablet (500 mg total) by mouth daily with evening meal. 30 tablet 11   ??? metoprolol succinate (TOPROL XL) 25 MG 24 hr tablet Take 2 tablets (50 mg total) by mouth nightly. 60 tablet 11   ??? MYFORTIC 180 mg EC tablet  Take 2 tablets (360 mg total) by mouth Two (2) times a day. 120 tablet 11   ??? omeprazole (PRILOSEC) 20 MG capsule Take 1 capsule (20 mg total) by mouth every other day. 15 capsule 2   ??? tacrolimus (ENVARSUS XR) 1 mg Tb24 extended release tablet Take 2 tablets (2 mg total) by mouth daily. 60 tablet 11     No current facility-administered medications for this visit.        Physical Exam  BP 111/68 (BP Site: L Arm, BP Position: Sitting, BP Cuff Size: Medium)  - Pulse 85  - Temp 36.7 ??C (98 ??F) (Temporal)  - Ht 162.6 cm (5' 4)  - Wt 65.9 kg (145 lb 3.2 oz)  - BMI 24.92 kg/m??   General: Well appearing male, no acute distress  HEENT: wearing mask, EOMI, sclera anicteric;   Neck: wearing mask  CV: normal rate, normal rhythm, no murmur, no gallops, no rubs appreciated  Lungs: clear to auscultation bilaterally, normal work of breathing.  Abdomen: soft, non tender.  Extremities: no edema  Musculoskeletal: no visible deformity, normal range of motion.  Pulses: intact distally throughout  Neurologic: awake, alert, and oriented x3    Laboratory Data and Imaging reviewed in EPIC    Assessment: [name] is a 59 y.o. 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     Recommendations/Plan:   Allograft Function: renal function stable at 0.80 with a baseline of approximately 0.7-0.9 since transplant. Will continue to monitor.     Immunosuppression Management [High Risk Medical Decision Making For Drug Therapy Requiring Intensive Monitoring For Toxicity]:  Tacrolimus trough was high yesterday so the dose was decreased to 2 mg. Targeting tacrolimus trough level of approximately 7-9. On decreased Myfortic 360 mg BID due to leukopenia.  WBC 6.3 12/24/19, which is up from 0.9 on 11/04/2019    Blood Pressure Management: BP 116/68 at this visit. Increased to Toprol-XL 50 mg at night. Should control heart rate. If not patient will be referred to cardiology. EKG done again in clinic today.     Infectious Prophylaxis and Monitoring:   Transitioned from Bactrim to Dapsone on 07/04/19 due to stable mild hyperkalemia.  No decoys present 09/03/19. Last CMV VL detected at 146 (12/24/19). Holding Valcyte.  The patient continues on Dapsone prophylaxis. Ends 11/22/20    Hypercalcemia: Persistent elevated but its better now. Ca 10.3 12/24/19.  - Parathyroid surgery - 11/11/2019    Lower Back Pain with midline radiculopathy: Known underlying degenerative changes. Resolved per Patient.     Atrial Fibrillation: Last EKG showed NSR.  Patient saw his cardiologist on 05/21/19 (care everywhere).  Echo on 05/08/19 showed EF 50-55%.      Health Maintenance:   - Colonoscopy due 2029    Immunizations:  - Vaccination: PCV-23 completed 07/06/2015.   -Flu shot: 08/06/19  - Prevnar 13 to be given at 1 year post    Counseling:  I counseled the patient on the need to avoid sun exposure and the use of sunblock while outdoors given the relatively higher risk of skin malignancy in an immunosuppressed state and the need for adherence to immunosuppression medication.  Patient verbalized understanding.     Follow-Up:  Return to clinic in 2 months  Patient will continue to follow-up with his primary care provider for non-transplant related issues and medication refills. We have ordered transplant specific labs per the center's guidelines  to monitor and assess for toxicities from immunosuppressant drug therapy    Scribe's Attestation: Leeroy Bock, MD obtained and performed the history, physical exam and medical decision making elements that were entered into the chart.  Signed by Sharion Balloon, Scribe, on December 25, 2019 at 10:47 AM.    Documentation assistance provided by the Scribe. I was present during the time the encounter was recorded. The information recorded by the Scribe was done at my direction and has been reviewed and validated by me.

## 2019-12-25 NOTE — Unmapped (Signed)
[facility] Refill Coordination Note    Specialty Medication(s) to be Shipped:   Transplant: Envarsus 1mg     Other medication(s) to be shipped: metoprolol succ 25mg , aspirin, and tums (sent rf request)     [name], DOB: Mar 15, 1961  Phone: [phone] (home)       All above HIPAA information was verified with patient.     Was a Nurse, learning disability used for this call? Yes, [place]. Patient language is appropriate in [facility]    Completed refill call assessment today to schedule patient's medication shipment from the [facility] [phone]).       Specialty medication(s) and dose(s) confirmed: Patient reports changes to the regimen as follows: Envarsus - 2mg  daily **new rx on profile**  Changes to medications: Chaston reports no changes at this time.  Changes to insurance: No  Questions for the pharmacist: No    Confirmed patient received Welcome Packet with first shipment. The patient will receive a drug information handout for each medication shipped and additional FDA Medication Guides as required.       DISEASE/MEDICATION-SPECIFIC INFORMATION        N/A    SPECIALTY MEDICATION ADHERENCE     Medication Adherence    Patient reported X missed doses in the last month: 0  Specialty Medication: Envarsus 1mg   Patient is on additional specialty medications: No          Envarsus 1 mg: 20 days of medicine on hand      SHIPPING     Shipping address confirmed in Epic.     Delivery Scheduled: Yes, Expected medication delivery date: 01/01/2020.     Medication will be delivered via UPS to the prescription address in Epic WAM.    Lorelei Pont [facility] Specialty Technician

## 2019-12-25 NOTE — Unmapped (Signed)
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 Pt doing well at this time, has healed completely from parathyroidectomy and is starting to work out.          12/25/19 0912   BP: 111/68   Pulse: 85   Temp: 36.7 ??C (98 ??F)   Weight: 65.9 kg (145 lb 3.2 oz)   Height: 162.6 cm (5' 4)   PainSc:   2   PainLoc: Generalized       Assessment  BP: at home 110-120s/60-70s  BG: started on Metformin 500mg  daily  Headache: no  Hand tremors: no  Numbness/tingling: no  Fevers: no  Chills/sweats: no  Shortness of breath: no  Chest pain or pressure: no  Palpitations: no  Nausea/vomiting: no  Diarrhea/constipation: no  UTI symptoms: no  Swelling: no    Good appetite; reports adequate hydration.     Any new medications? No   Immunosuppressant last taken: 9am yesterday    Per Dr. Nestor Lewandowsky: increasing Metoprolol to 50mg  daily, if continues to have elevated HR he is to call [facility], will refer to Cardiology.     I spent a total of 10 minutes with Louis Dennis reviewing medications and symptoms.

## 2019-12-25 NOTE — Unmapped (Signed)
Change in Envarsus dosage decrease. Co-pay $0.00.

## 2019-12-26 NOTE — Unmapped (Signed)
AOBP:Left arm  medium cuff   Average:111/68 Pulse:85  1st reading:114/71 Pulse:85  2nd reading:114/67 Pulse:86  3rd reading:104/66 Pulse:84

## 2019-12-27 NOTE — Unmapped (Signed)
Assessment and Plan:     Louis Dennis was seen today for hypertension.    Diagnoses and all orders for this visit:    Essential hypertension  BP borderline low (96/60 in clinic today). Home BP readings at goal.  Continue metoprolol 50 mg HS. Reviewed low sodium diet and encouraged regular exercise. Advised to continue to monitor and log at-home BP readings.    Elevated blood sugar  Post sub-total parathyroidectomy.   Glucose 194 today (non-fasting).   Advised to continue metformin 500 mg daily and follow up with specialist.   -     POCT glucose; Future    Kidney replaced by transplant  Patient following closely with nephrology. Continue treatment plan as directed by specialist.    Need for tetanus, diphtheria, and acellular pertussis (Tdap) vaccine  Tdap vaccine administered in clinic today.  -     Tdap vaccine greater than or equal to 7yo IM    HPI:      Louis Dennis  is here for   Chief Complaint   Patient presents with   ??? Hypertension     f/u     Hypertension: Patient presents for follow-up of hypertension. Blood pressure goal < 140/90.  Hypertension has customarily been at goal.  Home blood pressure readings: 119/72 this AM. Salt intake and diet: salt not added to cooking and salt shaker not on table. Associated signs and symptoms: none. Patient denies: blurred vision, chest pain, dyspnea, headache, neck aches, orthopnea, palpitations, paroxysmal nocturnal dyspnea, peripheral edema, pulsating in the ears and tiredness/fatigue. Medication compliance: taking as prescribed. He is not doing regular exercise.   Nephrology recently increased metoprolol to 50 mg HS, due to elevated HR.      Patient underwent sub-total parathyroidectomy with preservation of the left inferior gland on 11/11/19. He states he has been doing well post-op.   He was started on metformin 500 mg daily post-op due to elevated glucose.     ESRD: Patient has continued following closely with nephrology. Specialist recently increased follow up interval to every 2 months. He completes labs weekly - every Monday.      PCMH Components:     Goals     ??? Exercise at least 3x per week (30 min per time)      Pt walks about 4-5 days per week            Medication adherence and barriers to the treatment plan have been addressed. Opportunities to optimize healthy behaviors have been discussed. Patient / caregiver voiced understanding.      Past Medical/Surgical History:     Past Medical History:   Diagnosis Date   ??? Abnormal thyroid function test    ??? End stage renal disease (CMS-HCC)     now on peritoneal dialysis   ??? Gout     reports was in Right hand, foot and left elbow   ??? Hypertension     takes meds intermitttently based on readings   ??? Nephrolithiasis 2014     Past Surgical History:   Procedure Laterality Date   ??? OTHER SURGICAL HISTORY Right 08/2017    Pt had catheter moved from left side to right side.   ??? pd catheter  2012   ??? PR COLONOSCOPY W/BIOPSY SINGLE/MULTIPLE N/A 06/15/2018    Procedure: COLONOSCOPY, FLEXIBLE, PROXIMAL TO SPLENIC FLEXURE; WITH BIOPSY, SINGLE OR MULTIPLE;  Surgeon: Charm Rings, MD;  [facility];  Service: Gastroenterology   ??? PR COLSC FLX W/RMVL  OF TUMOR POLYP LESION SNARE TQ N/A 06/15/2018    Procedure: COLONOSCOPY FLEX; W/REMOV TUMOR/LES BY SNARE;  Surgeon: Charm Rings, MD;  Location: [facility];  Service: Gastroenterology   ??? PR EXPLORATORY OF ABDOMEN N/A 11/22/2018    Procedure: EXPLORATORY LAPAROTOMY, EXPLORATORY CELIOTOMY WITH OR WITHOUT BIOPSY(S);  Surgeon: Lawrence Marseilles Day Thurnell Lose, MD;  Location: MAIN OR [facility];  Service: Trauma   ??? PR EXPLORE PARATHYROID GLANDS N/A 11/11/2019    Procedure: RS 22 PARATHYROIDECTOMY OR EXPLORATION OF PARATHYROID(S);  Surgeon: Johny Chess, MD;  Location: MAIN OR [facility];  Service: Surgical Oncology   ??? PR FREEING BOWEL ADHESION,ENTEROLYSIS N/A 11/22/2018    Procedure: Enterolysis (Separt Proc);  Surgeon: Lawrence Marseilles Day Thurnell Lose, MD; Location: MAIN OR [facility];  Service: Trauma   ??? PR REMOVE PERITONEAL FOREIGN BODY N/A 11/22/2018    Procedure: Removal Of Peritoneal Of Foreign Body From Peritoneal Cavity;  Surgeon: Lawrence Marseilles Day Thurnell Lose, MD;  Location: MAIN OR [facility];  Service: Trauma   ??? PR TRANSPLANT,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    Procedure: [facility] STD PREP CAD DONR RENAL ALLOGFT PRIOR TO TRNSPLNT, INCL DISSEC/REM PERINEPH FAT, DIAPH/RTPER ATTAC;  Surgeon: Doyce Loose, MD;  Location: MAIN OR [facility];  Service: Transplant   ??? PR TRANSPLANTATION OF KIDNEY N/A 05/24/2019    Procedure: RENAL ALLOTRANSPLANTATION, IMPLANTATION OF GRAFT; WITHOUT RECIPIENT NEPHRECTOMY;  Surgeon: Doyce Loose, MD;  Location: MAIN OR [facility];  Service: Transplant       Family History:     Family History   Problem Relation Age of Onset   ??? Hypertension Mother    ??? Kidney disease Mother    ??? Diabetes Mother    ??? Hypertension Father    ??? Kidney disease Sister         s/p transplant   ??? Hypertension Brother    ??? Glaucoma Neg Hx    ??? Amblyopia Neg Hx    ??? Blindness Neg Hx    ??? Retinal detachment Neg Hx    ??? Strabismus Neg Hx    ??? Macular degeneration Neg Hx    ??? Basal cell carcinoma Neg Hx    ??? Melanoma Neg Hx    ??? Squamous cell carcinoma Neg Hx        Social History:     Social History     Socioeconomic History   ??? Marital status: Married     Spouse name: None   ??? Number of children: 2   ??? Years of education: 8   ??? Highest education level: None   Occupational History   ??? None   Social Needs   ??? Financial resource strain: Not hard at all   ??? Food insecurity     Worry: Sometimes true     Inability: Sometimes true   ??? Transportation needs     Medical: No     Non-medical: No   Tobacco Use   ??? Smoking status: Former Smoker     Packs/day: 1.00     Years: 30.00     Pack years: 30.00     Types: Cigarettes     Quit date: 07/10/2009     Years since quitting: 10.4   ??? Smokeless tobacco: Never Used   Substance and Sexual Activity   ??? Alcohol use: No Alcohol/week: 0.0 standard drinks   ??? Drug use: No   ??? Sexual activity: None   Lifestyle   ??? Physical activity     Days per  week: 7 days     Minutes per session: 90 min   ??? Stress: Only a little   Relationships   ??? Social Wellsite geologist on phone: More than three times a week     Gets together: More than three times a week     Attends religious service: More than 4 times per year     Active member of club or organization: Yes     Attends meetings of clubs or organizations: More than 4 times per year     Relationship status: Married   Other Topics Concern   ??? Do you use sunscreen? Yes   ??? Tanning bed use? No   ??? Are you easily burned? No   ??? Excessive sun exposure? No   ??? Blistering sunburns? No   Social History Narrative   ??? None       Allergies:     Patient has no known allergies.    Current Medications:     Current Outpatient Medications   Medication Sig Dispense Refill   ??? allopurinoL (ZYLOPRIM) 100 MG tablet Take 1 tablet (100 mg total) by mouth daily. 30 tablet 11   ??? aspirin (ECOTRIN) 81 MG tablet Take 1 tablet (81 mg total) by mouth daily. 30 tablet 11   ??? biotin 5 mg cap Take 1 capsule (5,000 mcg total) by mouth daily.  0   ??? calcitrioL (ROCALTROL) 0.5 MCG capsule Take 1 capsule (0.5 mcg total) by mouth Two (2) times a day. 60 capsule 11   ??? calcium carbonate (TUMS) 200 mg calcium (500 mg) chewable tablet Chew 3 tablets (600 mg elem calcium total) Three (3) times a day. 150 tablet 0   ??? magnesium oxide-Mg AA chelate (MAGNESIUM, AMINO ACID CHELATE,) 133 mg Tab Take 3 tablets by mouth Two (2) times a day. 180 tablet 5   ??? metFORMIN (GLUCOPHAGE-XR) 500 MG 24 hr tablet Take 1 tablet (500 mg total) by mouth daily with evening meal. 30 tablet 11   ??? metoprolol succinate (TOPROL XL) 25 MG 24 hr tablet Take 2 tablets (50 mg total) by mouth nightly. 60 tablet 11   ??? MYFORTIC 180 mg EC tablet Take 2 tablets (360 mg total) by mouth Two (2) times a day. 120 tablet 11   ??? omeprazole (PRILOSEC) 20 MG capsule Take 1 capsule (20 mg total) by mouth every other day. 15 capsule 2   ??? tacrolimus (ENVARSUS XR) 1 mg Tb24 extended release tablet Take 2 tablets (2 mg total) by mouth daily. 60 tablet 11     No current facility-administered medications for this visit.        Health Maintenance:     Health Maintenance Summary w/Most Recent Date       Status Date      Zoster Vaccines Overdue 06/19/2010     Lipid Screening Next Due 09/01/2024      Done 09/02/2019 LIPID PANEL     Done 09/02/2019 SmartData: [facility] TOTAL CHOLESTEROL AND HDL COMPLETE     Done 03/01/2019 LIPID PANEL     Done 01/25/2018 LIPID PANEL     Done 03/15/2016 LIPID PANEL     Patient has more history with this topic...    Colonoscopy Next Due 06/15/2028      Done 06/15/2018 Surg:PR COLONOSCOPY W/BIOPSY SINGLE/MULTIPLE     Done 06/15/2018 Surg:PR COLSC FLX W/RMVL OF TUMOR POLYP LESION SNARE TQ     Done 06/15/2018 COLONOSCOPY     Done 02/26/2013  COLONOSCOPY (HISTORICAL RESULT)    DTaP/Tdap/Td Vaccines Next Due 12/30/2029      Done 12/31/2019 Imm Admin: TdaP    Influenza Vaccine This plan is no longer active.      Done 08/06/2019 Imm Admin: Influenza Vaccine Quad (IIV4 PF) 32mo+ injectable     Done 10/02/2018 Imm Admin: Influenza Virus Vaccine, unspecified formulation     Done 09/04/2018 Imm Admin: Influenza Virus Vaccine, unspecified formulation     Done 09/18/2017 Imm Admin: Influenza Virus Vaccine, unspecified formulation     Done 09/11/2017 Imm Admin: Influenza Vaccine Quad (IIV4 PF) 57mo+ injectable     Patient has more history with this topic...    Hepatitis C Screen This plan is no longer active.      Done 10/21/2019 HEPATITIS C ANTIBODY Hepatitis C Ab           Done 05/23/2019 HEPATITIS C ANTIBODY Hepatitis C Ab           Done 04/02/2019 HEPATITIS C ANTIBODY Hepatitis C Ab           Done 03/01/2019 HEPATITIS C ANTIBODY Hepatitis C Ab           Done 11/26/2018 HEPATITIS C ANTIBODY Hepatitis C Ab           Patient has more history with this topic...          Immunizations:     Immunization History   Administered Date(s) Administered   ??? INFLUENZA INJ MDCK PF, Quad (Flucelvax)(4y and up Egg Free) 09/04/2017   ??? INFLUENZA TIV (TRI) PF (IM) 08/17/2011, 08/08/2012, 08/05/2013, 08/28/2014, 08/11/2015, 08/29/2015   ??? Influenza Vaccine Quad (IIV4 PF) 44mo+ injectable 08/16/2015, 09/06/2016, 09/11/2017, 08/06/2019   ??? Influenza Virus Vaccine, unspecified formulation 08/30/2016, 09/18/2017, 09/04/2018, 10/02/2018   ??? PNEUMOCOCCAL POLYSACCHARIDE 23 07/06/2015   ??? PPD Test 09/08/2011, 09/07/2012, 09/05/2013, 09/09/2014, 09/07/2015, 08/10/2016, 09/11/2017, 09/05/2018, 01/03/2019   ??? Pneumococcal Conjugate 13-Valent 07/24/2010, 07/15/2015   ??? TdaP 12/31/2019       I have reviewed and (if needed) updated the patient's problem list, medications, allergies, past medical and surgical history, social and family history.    ROS:      ROS  Comprehensive 10 point ROS negative unless otherwise stated in the HPI.       Vital Signs:     Wt Readings from Last 3 Encounters:   12/31/19 66.2 kg (146 lb)   12/25/19 65.9 kg (145 lb 3.2 oz)   11/19/19 66.7 kg (147 lb)     Temp Readings from Last 3 Encounters:   12/31/19 36.7 ??C (98.1 ??F) (Oral)   12/25/19 36.7 ??C (98 ??F) (Temporal)   11/19/19 36.7 ??C (98.1 ??F) (Temporal)     BP Readings from Last 3 Encounters:   12/31/19 96/60   12/25/19 111/68   11/19/19 99/57     Pulse Readings from Last 3 Encounters:   12/31/19 79   12/25/19 85   11/19/19 88     Estimated body mass index is 25.05 kg/m?? as calculated from the following:    Height as of this encounter: 162.6 cm (5' 4.02).    Weight as of this encounter: 66.2 kg (146 lb).  Facility age limit for growth percentiles is 20 years.    Objective:      General: Alert and oriented x3. Well-appearing. No acute distress.   HEENT:  Normocephalic.  Atraumatic. Conjunctiva and sclera normal. OP MMM without lesions.   Neck:  Supple. No thyroid enlargement. No adenopathy.  Heart:  Regular rate and rhythm . Normal S1, S2.  No murmurs, rubs or gallops.   Lungs:  No respiratory distress.  Lungs clear to auscultation. No wheezes, rhonchi, or rales.   GI/GU:  Soft, +BS, nondistended, non-TTP. No palpable masses or organomegaly.   Extremities:  No edema. Peripheral pulses normal.   Skin:  Warm, dry. No rash or lesions present. Small, well-healed, scar to base of anterior neck.   Neuro:  Non-focal. No obvious weakness.   Psych:  Affect normal, eye contact good, speech clear and coherent.      I attest that I, Bayard Hugger, personally documented this note while acting as scribe for Noralyn Pick, FNP.      Bayard Hugger, Scribe.  12/31/2019     The documentation recorded by the scribe accurately reflects the service I personally performed and the decisions made by me.    Noralyn Pick, FNP

## 2019-12-30 ENCOUNTER — Ambulatory Visit: Admit: 2019-12-30 | Discharge: 2019-12-31 | Payer: MEDICARE

## 2019-12-30 LAB — CBC W/ AUTO DIFF
BASOPHILS ABSOLUTE COUNT: 0.1 10*9/L (ref 0.0–0.1)
BASOPHILS RELATIVE PERCENT: 0.9 %
EOSINOPHILS ABSOLUTE COUNT: 0.7 10*9/L (ref 0.0–0.7)
EOSINOPHILS RELATIVE PERCENT: 11.9 %
HEMATOCRIT: 40.3 % (ref 38.0–50.0)
HEMOGLOBIN: 13.8 g/dL (ref 13.5–17.5)
LYMPHOCYTES ABSOLUTE COUNT: 0.7 10*9/L (ref 0.7–4.0)
MEAN CORPUSCULAR HEMOGLOBIN CONC: 34.2 g/dL (ref 30.0–36.0)
MEAN CORPUSCULAR HEMOGLOBIN: 32 pg (ref 26.0–34.0)
MEAN CORPUSCULAR VOLUME: 93.4 fL (ref 81.0–95.0)
MEAN PLATELET VOLUME: 8.2 fL (ref 7.0–10.0)
MONOCYTES ABSOLUTE COUNT: 0.6 10*9/L (ref 0.1–1.0)
MONOCYTES RELATIVE PERCENT: 10 %
NEUTROPHILS ABSOLUTE COUNT: 3.9 10*9/L (ref 1.7–7.7)
PLATELET COUNT: 275 10*9/L (ref 150–450)
RED BLOOD CELL COUNT: 4.31 10*12/L — ABNORMAL LOW (ref 4.32–5.72)
RED CELL DISTRIBUTION WIDTH: 13.9 % (ref 12.0–15.0)
WBC ADJUSTED: 6 10*9/L (ref 3.5–10.5)

## 2019-12-30 LAB — COMPREHENSIVE METABOLIC PANEL
ALBUMIN: 4.7 g/dL (ref 3.5–5.0)
ALKALINE PHOSPHATASE: 175 U/L — ABNORMAL HIGH (ref 38–126)
ALT (SGPT): 34 U/L (ref <50)
ANION GAP: 11 mmol/L (ref 7–15)
BILIRUBIN TOTAL: 0.6 mg/dL (ref 0.0–1.2)
BLOOD UREA NITROGEN: 21 mg/dL (ref 7–21)
BUN / CREAT RATIO: 30
CALCIUM: 10.7 mg/dL — ABNORMAL HIGH (ref 8.5–10.2)
CHLORIDE: 101 mmol/L (ref 98–107)
CO2: 25 mmol/L (ref 22.0–30.0)
CREATININE: 0.71 mg/dL (ref 0.70–1.30)
EGFR CKD-EPI AA MALE: >90 mL/min/{1.73_m2} (ref >=60)
EGFR CKD-EPI NON-AA MALE: >90 mL/min/{1.73_m2} (ref >=60)
GLUCOSE RANDOM: 183 mg/dL — ABNORMAL HIGH (ref 70–179)
POTASSIUM: 5 mmol/L (ref 3.5–5.0)
PROTEIN TOTAL: 6.8 g/dL (ref 6.5–8.3)
SODIUM: 137 mmol/L (ref 135–145)

## 2019-12-30 LAB — CHLORIDE: Chloride:SCnc:Pt:Ser/Plas:Qn:: 101

## 2019-12-30 LAB — MAGNESIUM: Magnesium:MCnc:Pt:Ser/Plas:Qn:: 1.4 — ABNORMAL LOW

## 2019-12-30 LAB — RED BLOOD CELL COUNT: Lab: 4.31 — ABNORMAL LOW

## 2019-12-30 LAB — PHOSPHORUS: Phosphate:MCnc:Pt:Ser/Plas:Qn:: 3.4

## 2019-12-31 ENCOUNTER — Ambulatory Visit: Admit: 2019-12-31 | Discharge: 2020-01-01 | Payer: MEDICARE | Attending: Family | Primary: Family

## 2019-12-31 DIAGNOSIS — R739 Hyperglycemia, unspecified: Principal | ICD-10-CM

## 2019-12-31 DIAGNOSIS — Z940 Kidney transplant status: Principal | ICD-10-CM

## 2019-12-31 DIAGNOSIS — Z23 Encounter for immunization: Principal | ICD-10-CM

## 2019-12-31 DIAGNOSIS — I10 Essential (primary) hypertension: Principal | ICD-10-CM

## 2019-12-31 LAB — VITAMIN D, TOTAL (25OH): Lab: 29.7

## 2019-12-31 LAB — TACROLIMUS, TROUGH: Lab: 6.2

## 2019-12-31 MED FILL — ASPIRIN 81 MG TABLET,DELAYED RELEASE: ORAL | 30 days supply | Qty: 30 | Fill #7

## 2019-12-31 MED FILL — ASPIRIN 81 MG TABLET,DELAYED RELEASE: 30 days supply | Qty: 30 | Fill #7 | Status: AC

## 2019-12-31 MED FILL — ENVARSUS XR 1 MG TABLET,EXTENDED RELEASE: 30 days supply | Qty: 60 | Fill #0 | Status: AC

## 2020-01-01 LAB — CMV VIRAL LD: Lab: 0

## 2020-01-01 LAB — CMV DNA, QUANTITATIVE, PCR: CMV QUANT LOG10: 2.58 {Log_IU}/mL — ABNORMAL HIGH (ref <0.00)

## 2020-01-01 NOTE — Unmapped (Signed)
[facility] Refill Coordination Note    Specialty Medication(s) to be Shipped:   Transplant: Myfortic 180mg     Other medication(s) to be shipped:   Allopurinol  Omeprazole  Mag 133mg   Calcitriol     Louis Dennis, DOB: 1961/03/03  Phone: [phone] (home)       All above HIPAA information was verified with patient.     Was a Nurse, learning disability used for this call? No    Completed refill call assessment today to schedule patient's medication shipment from the [facility] [phone]).       Specialty medication(s) and dose(s) confirmed: Regimen is correct and unchanged.   Changes to medications: Louis Dennis reports no changes at this time.  Changes to insurance: No  Questions for the pharmacist: No    Confirmed patient received Welcome Packet with first shipment. The patient will receive a drug information handout for each medication shipped and additional FDA Medication Guides as required.       DISEASE/MEDICATION-SPECIFIC INFORMATION        N/A    SPECIALTY MEDICATION ADHERENCE     Medication Adherence    Patient reported X missed doses in the last month: 0            Myfortic 180mg : 10 days worth of medication on hand.              SHIPPING     Shipping address confirmed in Epic.     Delivery Scheduled: Yes, Expected medication delivery date: 01/07/20.     Medication will be delivered via UPS to the prescription address in Epic WAM.    Louis Dennis   [facility] Specialty Technician

## 2020-01-02 LAB — EBV VIRAL LOAD RESULT: Lab: NOT DETECTED

## 2020-01-02 LAB — EBV QUANTITATIVE PCR, BLOOD: EBV VIRAL LOAD RESULT: NOT DETECTED

## 2020-01-03 ENCOUNTER — Institutional Professional Consult (permissible substitution): Admit: 2020-01-03 | Discharge: 2020-01-04 | Payer: MEDICARE

## 2020-01-03 ENCOUNTER — Ambulatory Visit: Admit: 2020-01-03 | Discharge: 2020-01-04 | Payer: MEDICARE

## 2020-01-03 DIAGNOSIS — H9313 Tinnitus, bilateral: Principal | ICD-10-CM

## 2020-01-03 DIAGNOSIS — Z79899 Other long term (current) drug therapy: Principal | ICD-10-CM

## 2020-01-03 DIAGNOSIS — Z940 Kidney transplant status: Principal | ICD-10-CM

## 2020-01-03 MED ORDER — OMEPRAZOLE 20 MG CAPSULE,DELAYED RELEASE
ORAL_CAPSULE | ORAL | 2 refills | 30 days | Status: CP
Start: 2020-01-03 — End: 2021-01-02
  Filled 2020-01-06: qty 15, 30d supply, fill #0

## 2020-01-03 NOTE — Unmapped (Signed)
AUDIOLOGIC EVALUATION    [PAIN 0/10]. Louis Dennis presents to [facility] for an audiologic evaluation. Patient is being seen in conjunction with Dr. Juel Burrow.    HISTORY: Louis Dennis is a 59 y.o. male with a history of tinnitus. 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 He states it was initially bothersome to the point of impacting his sleep but after doing research on tinnitus management strategies and successfully implementing these, it has become non-bothersome. He states the tinnitus is no longer constant and is no longer affecting his sleep. Patient denies otalgia, aural pressure, history of noise exposure, family history of hearing loss, dizziness.     RESULTS: Today's results were obtained using insert headphones with good reliability.    RIGHT ear: Hearing sensitivity within normal limits  Speech Reception Threshold: 15 dB HL  Word Recognition Score: 96% @ 60 dB HL (using Recorded NU-6 words with masking in contralateral ear)    LEFT ear: Hearing sensitivity within normal limits  Speech Reception Threshold: 15 dB HL  Word Recognition Score: 96% @ 60 dB HL (using Recorded NU-6 words with masking in contralateral ear)    Tympanometry was administered today to assess middle ear status.  RIGHT ear: Consistent with normal middle ear function  LEFT ear: Consistent with normal middle ear function    *SEE AUDIOGRAM IMAGE IN MEDIA TAB*    RECOMMENDATIONS:  ??? ENT - patient is seeing Dr. Juel Burrow today  ??? Re-evaluate per MD or sooner with any changes or concerns regarding hearing status      Mikal Plane, B.A.  [facility] Doctoral Student    I was physically present and immediately available to direct and supervise tasks that were related to patient management. The direction and supervision was continuous throughout the time these tasks were performed.    I wore appropriate PPE throughout entire appointment (face mask and eye protection). Patient also wore face mask appropriately for the entire appointment.        Para Skeans, AuD  Clinical Audiologist

## 2020-01-05 LAB — HLA DS POST TRANSPLANT
ANTI-DONOR DRW #1 MFI: 73 MFI
ANTI-DONOR DRW #2 MFI: 87 MFI
ANTI-DONOR HLA-A #1 MFI: 0 MFI
ANTI-DONOR HLA-A #2 MFI: 0 MFI
ANTI-DONOR HLA-B #1 MFI: 0 MFI
ANTI-DONOR HLA-C #1 MFI: 0 MFI
ANTI-DONOR HLA-C #2 MFI: 0 MFI
ANTI-DONOR HLA-DQB #1 MFI: 41 MFI
ANTI-DONOR HLA-DQB #2 MFI: 111 MFI
ANTI-DONOR HLA-DR #2 MFI: 82 MFI

## 2020-01-05 LAB — HLA CL2 AB RESULT: Lab: NEGATIVE

## 2020-01-05 LAB — HLA CLASS 1 ANTIBODY RESULT: Lab: POSITIVE

## 2020-01-05 LAB — DONOR HLA-DQB ANTIGEN #1

## 2020-01-05 LAB — FSAB CLASS 1 ANTIBODY SPECIFICITY

## 2020-01-05 LAB — FSAB CLASS 2 ANTIBODY SPECIFICITY: HLA CL2 AB RESULT: NEGATIVE

## 2020-01-06 ENCOUNTER — Ambulatory Visit: Admit: 2020-01-06 | Discharge: 2020-01-07 | Payer: MEDICARE

## 2020-01-06 DIAGNOSIS — L729 Follicular cyst of the skin and subcutaneous tissue, unspecified: Principal | ICD-10-CM

## 2020-01-06 LAB — CBC W/ AUTO DIFF
BASOPHILS ABSOLUTE COUNT: 0.1 10*9/L (ref 0.0–0.1)
BASOPHILS RELATIVE PERCENT: 0.8 %
EOSINOPHILS RELATIVE PERCENT: 11.7 %
HEMATOCRIT: 41.8 % (ref 38.0–50.0)
HEMOGLOBIN: 14 g/dL (ref 13.5–17.5)
LYMPHOCYTES ABSOLUTE COUNT: 0.8 10*9/L (ref 0.7–4.0)
LYMPHOCYTES RELATIVE PERCENT: 12 %
MEAN CORPUSCULAR HEMOGLOBIN CONC: 33.6 g/dL (ref 30.0–36.0)
MEAN CORPUSCULAR VOLUME: 92.3 fL (ref 81.0–95.0)
MEAN PLATELET VOLUME: 8.3 fL (ref 7.0–10.0)
MONOCYTES RELATIVE PERCENT: 8.8 %
NEUTROPHILS ABSOLUTE COUNT: 4.2 10*9/L (ref 1.7–7.7)
NEUTROPHILS RELATIVE PERCENT: 66.7 %
PLATELET COUNT: 255 10*9/L (ref 150–450)
RED BLOOD CELL COUNT: 4.53 10*12/L (ref 4.32–5.72)
RED CELL DISTRIBUTION WIDTH: 13.3 % (ref 12.0–15.0)
WBC ADJUSTED: 6.3 10*9/L (ref 3.5–10.5)

## 2020-01-06 LAB — BASIC METABOLIC PANEL
ANION GAP: 10 mmol/L (ref 7–15)
BUN / CREAT RATIO: 34
CHLORIDE: 102 mmol/L (ref 98–107)
CO2: 26 mmol/L (ref 22.0–30.0)
CREATININE: 0.76 mg/dL (ref 0.70–1.30)
EGFR CKD-EPI AA MALE: >90 mL/min/{1.73_m2} (ref >=60)
EGFR CKD-EPI NON-AA MALE: >90 mL/min/{1.73_m2} (ref >=60)
GLUCOSE RANDOM: 166 mg/dL (ref 70–179)
POTASSIUM: 5.2 mmol/L — ABNORMAL HIGH (ref 3.5–5.0)
SODIUM: 138 mmol/L (ref 135–145)

## 2020-01-06 LAB — EGFR CKD-EPI AA MALE: Glomerular filtration rate/1.73 sq M.predicted.black:ArVRat:Pt:Ser/Plas/Bld:Qn:Creatinine-based formula (CKD-EPI): >90

## 2020-01-06 LAB — MEAN CORPUSCULAR HEMOGLOBIN: Erythrocyte mean corpuscular hemoglobin:EntMass:Pt:RBC:Qn:Automated count: 31

## 2020-01-06 LAB — PHOSPHORUS
PHOSPHORUS: 3.4 mg/dL (ref 2.9–4.7)
Phosphate:MCnc:Pt:Ser/Plas:Qn:: 3.4

## 2020-01-06 LAB — TACROLIMUS LEVEL, TROUGH: TACROLIMUS, TROUGH: 7.9 ng/mL (ref 5.0–15.0)

## 2020-01-06 LAB — MAGNESIUM: Magnesium:MCnc:Pt:Ser/Plas:Qn:: 1.6

## 2020-01-06 LAB — TACROLIMUS, TROUGH: Lab: 7.9

## 2020-01-06 MED FILL — ALLOPURINOL 100 MG TABLET: 30 days supply | Qty: 30 | Fill #6 | Status: AC

## 2020-01-06 MED FILL — MG-PLUS-PROTEIN 133 MG TABLET: 30 days supply | Qty: 180 | Fill #1 | Status: AC

## 2020-01-06 MED FILL — MYFORTIC 180 MG TABLET,DELAYED RELEASE: 30 days supply | Qty: 120 | Fill #2 | Status: AC

## 2020-01-06 MED FILL — MG-PLUS-PROTEIN 133 MG TABLET: ORAL | 30 days supply | Qty: 180 | Fill #1

## 2020-01-06 MED FILL — OMEPRAZOLE 20 MG CAPSULE,DELAYED RELEASE: 30 days supply | Qty: 15 | Fill #0 | Status: AC

## 2020-01-06 MED FILL — ALLOPURINOL 100 MG TABLET: ORAL | 30 days supply | Qty: 30 | Fill #6

## 2020-01-06 MED FILL — MYFORTIC 180 MG TABLET,DELAYED RELEASE: ORAL | 30 days supply | Qty: 120 | Fill #2

## 2020-01-07 DIAGNOSIS — Z79899 Other long term (current) drug therapy: Principal | ICD-10-CM

## 2020-01-07 DIAGNOSIS — Z940 Kidney transplant status: Principal | ICD-10-CM

## 2020-01-07 LAB — CMV DNA, QUANTITATIVE, PCR: CMV QUANT LOG10: 2.4 {Log_IU}/mL — ABNORMAL HIGH (ref <0.00)

## 2020-01-07 LAB — CMV COMMENT: Lab: 0

## 2020-01-07 NOTE — Unmapped (Signed)
Lab orders entered

## 2020-01-13 ENCOUNTER — Ambulatory Visit: Admit: 2020-01-13 | Discharge: 2020-01-14 | Payer: MEDICARE

## 2020-01-13 LAB — BASIC METABOLIC PANEL
ANION GAP: 10 mmol/L (ref 7–15)
BLOOD UREA NITROGEN: 22 mg/dL — ABNORMAL HIGH (ref 7–21)
BUN / CREAT RATIO: 32
CALCIUM: 10.2 mg/dL (ref 8.5–10.2)
CO2: 27 mmol/L (ref 22.0–30.0)
CREATININE: 0.68 mg/dL — ABNORMAL LOW (ref 0.70–1.30)
EGFR CKD-EPI AA MALE: >90 mL/min/{1.73_m2} (ref >=60)
EGFR CKD-EPI NON-AA MALE: >90 mL/min/{1.73_m2} (ref >=60)
POTASSIUM: 4.5 mmol/L (ref 3.5–5.0)
SODIUM: 140 mmol/L (ref 135–145)

## 2020-01-13 LAB — CBC W/ AUTO DIFF
BASOPHILS ABSOLUTE COUNT: 0.1 10*9/L (ref 0.0–0.1)
BASOPHILS RELATIVE PERCENT: 0.8 %
EOSINOPHILS ABSOLUTE COUNT: 0.7 10*9/L (ref 0.0–0.7)
EOSINOPHILS RELATIVE PERCENT: 11.9 %
HEMATOCRIT: 41.8 % (ref 38.0–50.0)
HEMOGLOBIN: 14 g/dL (ref 13.5–17.5)
LYMPHOCYTES ABSOLUTE COUNT: 0.7 10*9/L (ref 0.7–4.0)
LYMPHOCYTES RELATIVE PERCENT: 11.2 %
MEAN CORPUSCULAR HEMOGLOBIN CONC: 33.6 g/dL (ref 30.0–36.0)
MEAN CORPUSCULAR HEMOGLOBIN: 30.6 pg (ref 26.0–34.0)
MEAN CORPUSCULAR VOLUME: 91.3 fL (ref 81.0–95.0)
MEAN PLATELET VOLUME: 8.4 fL (ref 7.0–10.0)
MONOCYTES ABSOLUTE COUNT: 0.6 10*9/L (ref 0.1–1.0)
MONOCYTES RELATIVE PERCENT: 9.1 %
NEUTROPHILS ABSOLUTE COUNT: 4.2 10*9/L (ref 1.7–7.7)
PLATELET COUNT: 216 10*9/L (ref 150–450)
RED BLOOD CELL COUNT: 4.57 10*12/L (ref 4.32–5.72)
WBC ADJUSTED: 6.2 10*9/L (ref 3.5–10.5)

## 2020-01-13 LAB — MAGNESIUM: Magnesium:MCnc:Pt:Ser/Plas:Qn:: 1.6

## 2020-01-13 LAB — PHOSPHORUS: Phosphate:MCnc:Pt:Ser/Plas:Qn:: 3.1

## 2020-01-13 LAB — RED CELL DISTRIBUTION WIDTH: Lab: 13.6

## 2020-01-13 LAB — TACROLIMUS, TROUGH: Lab: 6.8

## 2020-01-13 LAB — CALCIUM: Calcium:MCnc:Pt:Ser/Plas:Qn:: 10.2

## 2020-01-15 LAB — CMV DNA, QUANTITATIVE, PCR: CMV QUANT: 209 [IU]/mL — ABNORMAL HIGH (ref <0)

## 2020-01-15 LAB — CMV QUANT LOG10: Lab: 2.32 — ABNORMAL HIGH

## 2020-01-16 LAB — BK BLOOD COMMENT: Lab: 0

## 2020-01-16 LAB — BK VIRUS QUANTITATIVE PCR, BLOOD: BK BLOOD RESULT: NOT DETECTED

## 2020-01-20 ENCOUNTER — Ambulatory Visit: Admit: 2020-01-20 | Discharge: 2020-01-21 | Payer: MEDICARE

## 2020-01-20 LAB — BASIC METABOLIC PANEL
ANION GAP: 14 mmol/L (ref 7–15)
BLOOD UREA NITROGEN: 26 mg/dL — ABNORMAL HIGH (ref 7–21)
BUN / CREAT RATIO: 38
CALCIUM: 9.6 mg/dL (ref 8.5–10.2)
CHLORIDE: 104 mmol/L (ref 98–107)
CO2: 22 mmol/L (ref 22.0–30.0)
EGFR CKD-EPI AA MALE: >90 mL/min/{1.73_m2} (ref >=60)
EGFR CKD-EPI NON-AA MALE: >90 mL/min/{1.73_m2} (ref >=60)
GLUCOSE RANDOM: 148 mg/dL (ref 70–179)
POTASSIUM: 4.6 mmol/L (ref 3.5–5.0)
SODIUM: 140 mmol/L (ref 135–145)

## 2020-01-20 LAB — CBC W/ AUTO DIFF
BASOPHILS ABSOLUTE COUNT: 0.1 10*9/L (ref 0.0–0.1)
BASOPHILS RELATIVE PERCENT: 0.8 %
EOSINOPHILS RELATIVE PERCENT: 13.7 %
HEMATOCRIT: 42.2 % (ref 38.0–50.0)
HEMOGLOBIN: 14.2 g/dL (ref 13.5–17.5)
LYMPHOCYTES ABSOLUTE COUNT: 0.8 10*9/L (ref 0.7–4.0)
LYMPHOCYTES RELATIVE PERCENT: 12.1 %
MEAN CORPUSCULAR HEMOGLOBIN CONC: 33.6 g/dL (ref 30.0–36.0)
MEAN CORPUSCULAR HEMOGLOBIN: 30.5 pg (ref 26.0–34.0)
MEAN CORPUSCULAR VOLUME: 91 fL (ref 81.0–95.0)
MEAN PLATELET VOLUME: 8.4 fL (ref 7.0–10.0)
MONOCYTES RELATIVE PERCENT: 7.7 %
NEUTROPHILS ABSOLUTE COUNT: 4.1 10*9/L (ref 1.7–7.7)
PLATELET COUNT: 228 10*9/L (ref 150–450)
RED BLOOD CELL COUNT: 4.64 10*12/L (ref 4.32–5.72)
RED CELL DISTRIBUTION WIDTH: 13.7 % (ref 12.0–15.0)
WBC ADJUSTED: 6.3 10*9/L (ref 3.5–10.5)

## 2020-01-20 LAB — MEAN CORPUSCULAR VOLUME: Erythrocyte mean corpuscular volume:EntVol:Pt:RBC:Qn:Automated count: 91

## 2020-01-20 LAB — TACROLIMUS, TROUGH: Lab: 8.9

## 2020-01-20 LAB — PHOSPHORUS
PHOSPHORUS: 3.2 mg/dL (ref 2.9–4.7)
Phosphate:MCnc:Pt:Ser/Plas:Qn:: 3.2

## 2020-01-20 LAB — MAGNESIUM: Magnesium:MCnc:Pt:Ser/Plas:Qn:: 1.7

## 2020-01-20 LAB — CALCIUM: Calcium:MCnc:Pt:Ser/Plas:Qn:: 9.6

## 2020-01-21 LAB — CMV QUANT: Lab: 177 — ABNORMAL HIGH

## 2020-01-21 LAB — CMV DNA, QUANTITATIVE, PCR: CMV QUANT: 177 [IU]/mL — ABNORMAL HIGH (ref <0)

## 2020-01-23 ENCOUNTER — Ambulatory Visit: Admit: 2020-01-23 | Discharge: 2020-01-24 | Payer: MEDICARE

## 2020-01-23 DIAGNOSIS — H469 Unspecified optic neuritis: Principal | ICD-10-CM

## 2020-01-23 MED FILL — METOPROLOL SUCCINATE ER 25 MG TABLET,EXTENDED RELEASE 24 HR: ORAL | 30 days supply | Qty: 60 | Fill #1

## 2020-01-23 MED FILL — METOPROLOL SUCCINATE ER 25 MG TABLET,EXTENDED RELEASE 24 HR: 30 days supply | Qty: 60 | Fill #1 | Status: AC

## 2020-01-23 NOTE — Unmapped (Signed)
Louis Dennis is a 59 y.o. male  with HTN, A fib, ESRD s/p transplant, who presents for follow up for NAION.     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 Oren Bracket [facility] 20/60 OS   05/2019: Kidney transplant      Past Medical History:  HTN, A fib, ESRD, Kidney transplant     Previous images:  MRI Brain without contrast 04/11/2016   FINDINGS:  The study was terminated early given patient's inability to remain still. Some sequences are compromised by patient motion. There are scattered and confluent  foci of signal abnormality within the periventricular and deep white matter.  These are nonspecific but commonly seen with small vessel ischemic changes. There is no midline shift or mass lesion. There is no evidence of intracranial hemorrhage or acute infarct.  No diffusion weighted signal abnormality is identified. There is bilateral, symmetric proptosis. The optic nerves appear symmetric and grossly normal. A mucus retention cyst is noted in the right maxillary sinus.  IMPRESSION:  -No acute intracranial process.  -No gross abnormalities of the optic nerves. Evaluation for optic neuritis limited by lack of IV contrast.  -Mild small vessel ischemic changes.  ADDENDUM: On attending review, although the STIR coronal images through the orbits are markedly motion degraded, there is suggestion of increased signal within the left optic nerve, compatible with optic neuritis. There may be some atrophy of the left optic nerve as well.     Today's Exam:  BCVA OS has improved to 20/40   Images (January 23, 2020):  OCT RNFL  OD: Average RNFL thickness 103, no thinning   OS: Average RNFL thickness 53, thinning S/T/I diffuse GCC thinning   HVF:   OD: no VF deficits. 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   - Mrx given   - Follow up in 1 year or PRN  - I have spent more than 50% of the provider's face-to-face visit time with a patient is spent in counseling or coordination of care.  - I was present and involved in all parts of the service and the documentation is mine.   - I instructed patient to call [facility] at [phone] for questions/concerns or report immediately to the Emergency department.  - CC referring provider

## 2020-01-27 ENCOUNTER — Ambulatory Visit: Admit: 2020-01-27 | Discharge: 2020-01-28 | Payer: MEDICARE

## 2020-01-27 LAB — CBC W/ AUTO DIFF
BASOPHILS ABSOLUTE COUNT: 0 10*9/L (ref 0.0–0.1)
EOSINOPHILS ABSOLUTE COUNT: 0.9 10*9/L — ABNORMAL HIGH (ref 0.0–0.7)
EOSINOPHILS RELATIVE PERCENT: 13.6 %
HEMATOCRIT: 42.5 % (ref 38.0–50.0)
LYMPHOCYTES ABSOLUTE COUNT: 0.7 10*9/L (ref 0.7–4.0)
LYMPHOCYTES RELATIVE PERCENT: 11.8 %
MEAN CORPUSCULAR HEMOGLOBIN CONC: 33.7 g/dL (ref 30.0–36.0)
MEAN CORPUSCULAR VOLUME: 91.3 fL (ref 81.0–95.0)
MEAN PLATELET VOLUME: 8.5 fL (ref 7.0–10.0)
MONOCYTES ABSOLUTE COUNT: 0.5 10*9/L (ref 0.1–1.0)
MONOCYTES RELATIVE PERCENT: 7.7 %
NEUTROPHILS ABSOLUTE COUNT: 4.2 10*9/L (ref 1.7–7.7)
NEUTROPHILS RELATIVE PERCENT: 66.2 %
PLATELET COUNT: 245 10*9/L (ref 150–450)
RED BLOOD CELL COUNT: 4.66 10*12/L (ref 4.32–5.72)
RED CELL DISTRIBUTION WIDTH: 13.9 % (ref 12.0–15.0)
WBC ADJUSTED: 6.3 10*9/L (ref 3.5–10.5)

## 2020-01-27 LAB — PHOSPHORUS: Phosphate:MCnc:Pt:Ser/Plas:Qn:: 3.1

## 2020-01-27 LAB — BASIC METABOLIC PANEL
ANION GAP: 10 mmol/L (ref 7–15)
BLOOD UREA NITROGEN: 22 mg/dL — ABNORMAL HIGH (ref 7–21)
BUN / CREAT RATIO: 31
CALCIUM: 10.1 mg/dL (ref 8.5–10.2)
CHLORIDE: 104 mmol/L (ref 98–107)
CO2: 26 mmol/L (ref 22.0–30.0)
CREATININE: 0.71 mg/dL (ref 0.70–1.30)
EGFR CKD-EPI NON-AA MALE: >90 mL/min/{1.73_m2} (ref >=60)
GLUCOSE RANDOM: 157 mg/dL (ref 70–179)
POTASSIUM: 4.7 mmol/L (ref 3.5–5.0)
SODIUM: 140 mmol/L (ref 135–145)

## 2020-01-27 LAB — CHLORIDE: Chloride:SCnc:Pt:Ser/Plas:Qn:: 104

## 2020-01-27 LAB — MAGNESIUM: Magnesium:MCnc:Pt:Ser/Plas:Qn:: 1.7

## 2020-01-27 LAB — TACROLIMUS, TROUGH: Lab: 7.6

## 2020-01-27 LAB — HEMOGLOBIN: Hemoglobin:MCnc:Pt:Bld:Qn:: 14.3

## 2020-01-28 LAB — CMV DNA, QUANTITATIVE, PCR: CMV QUANT: 128 [IU]/mL — ABNORMAL HIGH (ref <0)

## 2020-01-28 LAB — CMV QUANT LOG10: Lab: 2.11 — ABNORMAL HIGH

## 2020-01-28 NOTE — Unmapped (Signed)
[facility] Clinical Assessment & Refill Coordination Note    [name], [name]: 06-24-1961  Phone: [phone] (home)     All above HIPAA information was verified with patient.     Was a Nurse, learning disability used for this call? Yes, [name]. Patient language is appropriate in [facility]    Specialty Medication(s):   Transplant: Envarsus 1mg  and Myfortic 180mg      Current Outpatient Medications   Medication Sig Dispense Refill   ??? allopurinoL (ZYLOPRIM) 100 MG tablet Take 1 tablet (100 mg total) by mouth daily. 30 tablet 11   ??? aspirin (ECOTRIN) 81 MG tablet Take 1 tablet (81 mg total) by mouth daily. 30 tablet 11   ??? biotin 5 mg cap Take 1 capsule (5,000 mcg total) by mouth daily.  0   ??? calcitrioL (ROCALTROL) 0.5 MCG capsule Take 1 capsule (0.5 mcg total) by mouth Two (2) times a day. 60 capsule 11   ??? magnesium oxide-Mg AA chelate (MAGNESIUM, AMINO ACID CHELATE,) 133 mg Tab Take 3 tablets by mouth Two (2) times a day. 180 tablet 5   ??? metFORMIN (GLUCOPHAGE-XR) 500 MG 24 hr tablet Take 1 tablet (500 mg total) by mouth daily with evening meal. 30 tablet 11   ??? metoprolol succinate (TOPROL XL) 25 MG 24 hr tablet Take 2 tablets (50 mg total) by mouth nightly. 60 tablet 11   ??? MYFORTIC 180 mg EC tablet Take 2 tablets (360 mg total) by mouth Two (2) times a day. 120 tablet 11   ??? omeprazole (PRILOSEC) 20 MG capsule Take 1 capsule (20 mg total) by mouth every other day. 15 capsule 2   ??? tacrolimus (ENVARSUS XR) 1 mg Tb24 extended release tablet Take 2 tablets (2 mg total) by mouth daily. 60 tablet 11     No current facility-administered medications for this visit.         Changes to medications: Louis Dennis reports no changes at this time.    No Known Allergies    Changes to allergies: No    SPECIALTY MEDICATION ADHERENCE     Myfortic 180 mg: 7 days of medicine on hand   Envarsus Xr 1 mg: 7 days of medicine on hand       Medication Adherence    Patient reported X missed doses in the last month: 0  Specialty Medication: Myfortic 180mg   Patient is on additional specialty medications: Yes  Additional Specialty Medications: Envarsus Xr 1mg   Patient Reported Additional Medication X Missed Doses in the Last Month: 0  Patient is on more than two specialty medications: No          Specialty medication(s) dose(s) confirmed: Regimen is correct and unchanged.     Are there any concerns with adherence? No    Adherence counseling provided? Not needed    CLINICAL MANAGEMENT AND INTERVENTION      Clinical Benefit Assessment:    Do you feel the medicine is effective or helping your condition? Yes    Clinical Benefit counseling provided? Not needed    Adverse Effects Assessment:    Are you experiencing any side effects? No    Are you experiencing difficulty administering your medicine? No    Quality of Life Assessment:    How many days over the past month did your kidney transplant  keep you from your normal activities? For example, brushing your teeth or getting up in the morning. 0    Have you discussed this with your provider? Not needed  Therapy Appropriateness:    Is therapy appropriate? Yes, therapy is appropriate and should be continued    DISEASE/MEDICATION-SPECIFIC INFORMATION      N/A    PATIENT SPECIFIC NEEDS     ? Does the patient have any physical, cognitive, or cultural barriers? No    ? Is the patient high risk? Yes, patient is taking a REMS drug. Medication is dispensed in compliance with REMS program.     ? Does the patient require a Care Management Plan? No     ? Does the patient require physician intervention or other additional services (i.e. nutrition, smoking cessation, social work)? No      SHIPPING     Specialty Medication(s) to be Shipped:   Transplant: Envarsus 1mg  and Myfortic 180mg     Other medication(s) to be shipped: ASA, Omeprazole, Mg, Allopurinol     Changes to insurance: No    Delivery Scheduled: Yes, Expected medication delivery date: 02/04/20.     Medication will be delivered via Same Day Courier to the confirmed prescription address in [facility].    The patient will receive a drug information handout for each medication shipped and additional FDA Medication Guides as required.  Verified that patient has previously received a Conservation officer, historic buildings.    All of the patient's questions and concerns have been addressed.    Tera Helper   [facility] Specialty Pharmacist

## 2020-02-03 ENCOUNTER — Ambulatory Visit: Admit: 2020-02-03 | Discharge: 2020-02-04 | Payer: MEDICARE

## 2020-02-03 DIAGNOSIS — Z940 Kidney transplant status: Principal | ICD-10-CM

## 2020-02-03 DIAGNOSIS — Z79899 Other long term (current) drug therapy: Principal | ICD-10-CM

## 2020-02-03 LAB — CBC W/ AUTO DIFF
BASOPHILS ABSOLUTE COUNT: 0.1 10*9/L (ref 0.0–0.1)
BASOPHILS RELATIVE PERCENT: 1.2 %
EOSINOPHILS ABSOLUTE COUNT: 0.8 10*9/L — ABNORMAL HIGH (ref 0.0–0.7)
EOSINOPHILS RELATIVE PERCENT: 12.6 %
HEMATOCRIT: 42.6 % (ref 38.0–50.0)
HEMOGLOBIN: 14.2 g/dL (ref 13.5–17.5)
LYMPHOCYTES RELATIVE PERCENT: 10.8 %
MEAN CORPUSCULAR HEMOGLOBIN CONC: 33.3 g/dL (ref 30.0–36.0)
MEAN CORPUSCULAR HEMOGLOBIN: 30.1 pg (ref 26.0–34.0)
MEAN PLATELET VOLUME: 8.4 fL (ref 7.0–10.0)
MONOCYTES ABSOLUTE COUNT: 0.5 10*9/L (ref 0.1–1.0)
MONOCYTES RELATIVE PERCENT: 8.3 %
NEUTROPHILS ABSOLUTE COUNT: 4.1 10*9/L (ref 1.7–7.7)
NEUTROPHILS RELATIVE PERCENT: 67.1 %
PLATELET COUNT: 214 10*9/L (ref 150–450)
RED CELL DISTRIBUTION WIDTH: 14.3 % (ref 12.0–15.0)
WBC ADJUSTED: 6.2 10*9/L (ref 3.5–10.5)

## 2020-02-03 LAB — BASIC METABOLIC PANEL
ANION GAP: 15 mmol/L (ref 7–15)
BLOOD UREA NITROGEN: 26 mg/dL — ABNORMAL HIGH (ref 7–21)
BUN / CREAT RATIO: 38
CALCIUM: 9.9 mg/dL (ref 8.5–10.2)
CHLORIDE: 104 mmol/L (ref 98–107)
CREATININE: 0.68 mg/dL — ABNORMAL LOW (ref 0.70–1.30)
EGFR CKD-EPI AA MALE: >90 mL/min/{1.73_m2} (ref >=60)
EGFR CKD-EPI NON-AA MALE: >90 mL/min/{1.73_m2} (ref >=60)
GLUCOSE RANDOM: 152 mg/dL (ref 70–179)
POTASSIUM: 4.5 mmol/L (ref 3.5–5.0)
SODIUM: 141 mmol/L (ref 135–145)

## 2020-02-03 LAB — MEAN CORPUSCULAR HEMOGLOBIN: Erythrocyte mean corpuscular hemoglobin:EntMass:Pt:RBC:Qn:Automated count: 30.1

## 2020-02-03 LAB — TACROLIMUS, TROUGH: Lab: 7.4

## 2020-02-03 LAB — PHOSPHORUS: Phosphate:MCnc:Pt:Ser/Plas:Qn:: 3.1

## 2020-02-03 LAB — CREATININE: Creatinine:MCnc:Pt:Ser/Plas:Qn:: 0.68 — ABNORMAL LOW

## 2020-02-03 LAB — MAGNESIUM: Magnesium:MCnc:Pt:Ser/Plas:Qn:: 1.8

## 2020-02-04 LAB — CMV DNA, QUANTITATIVE, PCR: CMV VIRAL LD: NOT DETECTED

## 2020-02-04 LAB — CMV COMMENT: Lab: 0

## 2020-02-04 MED FILL — ENVARSUS XR 1 MG TABLET,EXTENDED RELEASE: ORAL | 30 days supply | Qty: 60 | Fill #1

## 2020-02-04 MED FILL — OMEPRAZOLE 20 MG CAPSULE,DELAYED RELEASE: 30 days supply | Qty: 15 | Fill #1 | Status: AC

## 2020-02-04 MED FILL — ALLOPURINOL 100 MG TABLET: 30 days supply | Qty: 30 | Fill #7 | Status: AC

## 2020-02-04 MED FILL — ASPIRIN 81 MG TABLET,DELAYED RELEASE: 30 days supply | Qty: 30 | Fill #8 | Status: AC

## 2020-02-04 MED FILL — ENVARSUS XR 1 MG TABLET,EXTENDED RELEASE: 30 days supply | Qty: 60 | Fill #1 | Status: AC

## 2020-02-04 MED FILL — MYFORTIC 180 MG TABLET,DELAYED RELEASE: ORAL | 30 days supply | Qty: 120 | Fill #3

## 2020-02-04 MED FILL — ASPIRIN 81 MG TABLET,DELAYED RELEASE: ORAL | 30 days supply | Qty: 30 | Fill #8

## 2020-02-04 MED FILL — MG-PLUS-PROTEIN 133 MG TABLET: ORAL | 30 days supply | Qty: 180 | Fill #2

## 2020-02-04 MED FILL — MG-PLUS-PROTEIN 133 MG TABLET: 30 days supply | Qty: 180 | Fill #2 | Status: AC

## 2020-02-04 MED FILL — MYFORTIC 180 MG TABLET,DELAYED RELEASE: 30 days supply | Qty: 120 | Fill #3 | Status: AC

## 2020-02-04 MED FILL — ALLOPURINOL 100 MG TABLET: ORAL | 30 days supply | Qty: 30 | Fill #7

## 2020-02-04 NOTE — Unmapped (Signed)
[facility] PHARMACY NOTE  02/05/2020  Louis Dennis  [id number]      Medication changes today:  1. Increase Myfortic to 540 mg BID.  2. Decrease Mag plus protein to 133 mg BID.  3. Increase omeprazole to 20 mg daily.    Education/Adherence tools provided today:  1. provided additional education on immunosuppression and transplant related medications including reviewing indications of medications, dosing and side effects   2. Provided updated medication list    Follow up items:  1. goal of understanding indications and dosing of immunosuppression medications  2. Consider starting statin at a later date  3. Continue to monitor CMV levels  4. Monitor Ca and Mg levels s/p parathyroidectomy (now off calcitriol)  5. Consider starting vitamin D supplement (now off calcitriol)    Next visit with pharmacy in 1-3 months  ____________________________________________________________________    Louis Dennis is a 59 y.o. male s/p deceased kidney transplant on 2019-06-18 (Kidney) 2/2 HTN.     Other PMH significant for hypertension, gout, paroxysmal Afib, s/p subtotal parathyroidectomy 11/2019    Seen by pharmacy today for: medication management and pill box fill and adherence education; last seen by pharmacy 4 months ago.     CC:  Patient has no complaints today    Vitals:    02/05/20 1031   BP: 103/66   Pulse: 92   Temp: 36.8 ??C (98.3 ??F)       No Known Allergies    All medications reviewed and updated.     Medication list includes revisions made during today???s encounter    Outpatient Encounter Medications as of 02/05/2020   Medication Sig Dispense Refill   ??? allopurinoL (ZYLOPRIM) 100 MG tablet Take 1 tablet (100 mg total) by mouth daily. 30 tablet 11   ??? aspirin (ECOTRIN) 81 MG tablet Take 1 tablet (81 mg total) by mouth daily. 30 tablet 11   ??? biotin 5 mg cap Take 1 capsule (5,000 mcg total) by mouth daily.  0   ??? calcitrioL (ROCALTROL) 0.5 MCG capsule Take 1 capsule (0.5 mcg total) by mouth Two (2) times a day. 60 capsule 11   ??? magnesium oxide-Mg AA chelate (MAGNESIUM, AMINO ACID CHELATE,) 133 mg Tab Take 3 tablets by mouth Two (2) times a day. 180 tablet 5   ??? metFORMIN (GLUCOPHAGE-XR) 500 MG 24 hr tablet Take 1 tablet (500 mg total) by mouth daily with evening meal. 30 tablet 11   ??? metoprolol succinate (TOPROL XL) 25 MG 24 hr tablet Take 2 tablets (50 mg total) by mouth nightly. 60 tablet 11   ??? MYFORTIC 180 mg EC tablet Take 2 tablets (360 mg total) by mouth Two (2) times a day. 120 tablet 11   ??? omeprazole (PRILOSEC) 20 MG capsule Take 1 capsule (20 mg total) by mouth every other day. 15 capsule 2   ??? tacrolimus (ENVARSUS XR) 1 mg Tb24 extended release tablet Take 2 tablets (2 mg total) by mouth daily. 60 tablet 11     No facility-administered encounter medications on file as of 02/05/2020.      Immunosuppression:    Induction agent: alemtuzumab    Current immunosuppression:  ?? Envarsus 2 mg daily  ?? tac goal: 7-9   ?? myfortic360  mg PO bid (reduced due to leukopenia)  ?? steroid free     Patient is tolerating immunosuppression well    IMMUNOSUPPRESSION DRUG LEVELS:  Lab Results   Component Value Date    Tacrolimus, Trough 7.4  02/03/2020    Tacrolimus, Trough 7.6 01/27/2020    Tacrolimus, Trough 8.9 01/20/2020     No results found for: CYCLO  No results found for: EVEROLIMUS  No results found for: SIROLIMUS    Envarsus level not drawn today    Graft function: stable   DSA: ntd  Biopsies to date: ntd  WBC/ANC:  wnl    Plan: Will increase Myfortic to 540 mg BID given WBC/ANC stable. Maintain current Envarsus regimen. Continue to monitor.    OI Prophylaxis:   CMV Status: D+/ R+, moderate risk . CMV prophylaxis: valganciclovir 450 mg daily x 3 months complete  ?? CMV treatment x2 weeks in 09/2019  ?? Last CMV level not detected (02/03/20)  Lab Results   Component Value Date    CMV Quant 128 (H) 01/27/2020    CMV Quant 177 (H) 01/20/2020    CMV Quant 209 (H) 01/13/2020    CMV Quant 249 (H) 01/06/2020    CMV Quant 381 (H) 12/30/2019    CMV Quant 146 (H) 12/24/2019     PCP Prophylaxis: bactrim SS 1 tab MWF -> dapsone 2/2 hyperkalemia x 6 months complete  K pneumo UTI 6/29: completed 7d of cephalexin  Thrush: completed in hospital   Donor positive BCx [facility]. [place]: dapto inpatient - linezolid 600 mg BID outpt thru 7/3 complete  Patient is off prophylaxis      Plan: prophylaxis completed. Continue to monitor.    CV Prophylaxis:  Aspirin therapy: asa 81 mg   The 10-year ASCVD risk score Denman George DC Jr., et al., 2013) is: 7.5%  Statin therapy: Indicated; currently on no statin    Plan: consider starting statin at a later date. Continue to monitor.    BP/history of paroxysmal AF:  Goal BP < 140/90. Clinic vitals reported above.  CHADS2-VASc score = 0  Home BP ranges:100-110s/60-70s  Current meds include: metoprolol XL 50 mg daily    Plan: BP within goal and patient denies any dizziness/lightheadedness. Continue current metoprolol dose. Continue to monitor.    Anemia of CKD:  H/H:   Lab Results   Component Value Date    HGB 14.2 02/03/2020     Lab Results   Component Value Date    HCT 42.6 02/03/2020     Iron panel:  No results found for: IRON, TIBC, FERRITIN  No results found for: LABIRON    Prior ESA use: none post transplant    Plan: within goal. Continue to monitor.     DM:   Lab Results   Component Value Date    A1C 5.9 (H) 12/24/2019   Goal A1c < 7  History of Dm? No  Established with endocrinologist/PCP for BG managment? No  Current meds include: metformin 500 mg daily  Home BS log: not checking  Diet: did not address specifics but patient reports to eating well  Exercise: did not assess  Fluid intake: ~2.5L daily    Plan: Continue to monitor.    Electrolytes: wnl  Current meds include: Mg plus protein 399 mg BID, calcium carbonate 400 mg TID    Plan: Continue to monitor.    GI/BM: pt reports no diarrhea or constipation, no heartburn with PPI but some increased gas in stomach  Current meds include: omeprazole 20 mg every other day Plan: Increase omeprazole to 20 mg daily. Continue to monitor.    Pain: pt reports no pain  Current meds include: APAP PRN, Beng[place] to back    Plan: Continue to monitor.  Bone health:   Now s/p subtotal parathyroidectomy in 11/2019  Vitamin D Level: 29.7 (12/30/19). Goal > 30.   Last DEXA results:  none available  Current meds include: none (stopped calcitriol ~3 weeks ago)    Plan: Vitamin D level slightly out of goal. Consider starting vitamin D supplementation at next visit as appropriate. Continue to monitor.    Women's/Men's Health:  Louis Dennis is a 59 y.o. male. Patient reports no men's/women's health issues.    Plan: Continue to monitor.    Gout: no recent flares  Current meds include: allopurinol 100 mg daily    Plan: Continue to monitor.    Hair thinning:  Current meds include: biotin 5 mg daily    Plan: Continue to monitor.    Adherence:  Patient has good understanding of medications; was able to independently identify names/doses of immunosuppressants and OI meds.  Patient does not fill their own pill box on a regular basis at home.  His wife manages his medications.  Patient brought medication card:yes  Pill box:was correct     Plan: Encouraged patient to continue working towards greater independence with medications; provided moderate adherence counseling/intervention    Spent approximately 20 minutes on educating this patient and greater than 50% was spent in direct face to face counseling regarding post transplant medication education. Questions and concerns were address to patient's satisfaction.    Patient was reviewed with Dr. Nestor Lewandowsky who was agreement with the stated plan:     During this visit, the following was completed:   BP log data assessment  Labs ordered and evaluated  complex treatment plan >1 DS   Patient education was completed for 11-24 minutes     All questions/concerns were addressed to the patient's satisfaction.  __________________________________________  Damita Dunnings, PharmD, CPP, [facility]

## 2020-02-05 ENCOUNTER — Ambulatory Visit: Admit: 2020-02-05 | Discharge: 2020-02-05 | Payer: MEDICARE | Attending: Nephrology | Primary: Nephrology

## 2020-02-05 ENCOUNTER — Ambulatory Visit: Admit: 2020-02-05 | Discharge: 2020-02-05 | Payer: MEDICARE

## 2020-02-05 DIAGNOSIS — Z940 Kidney transplant status: Principal | ICD-10-CM

## 2020-02-05 DIAGNOSIS — Z79899 Other long term (current) drug therapy: Principal | ICD-10-CM

## 2020-02-05 LAB — PROTEIN / CREATININE RATIO, URINE: CREATININE, URINE: 118 mg/dL

## 2020-02-05 LAB — URINALYSIS
BACTERIA: NONE SEEN /HPF
BILIRUBIN UA: NEGATIVE
BLOOD UA: NEGATIVE
GLUCOSE UA: NEGATIVE
KETONES UA: NEGATIVE
LEUKOCYTE ESTERASE UA: NEGATIVE
NITRITE UA: NEGATIVE
PH UA: 6 (ref 5.0–9.0)
PROTEIN UA: NEGATIVE
RBC UA: 1 /HPF (ref <=3)
SPECIFIC GRAVITY UA: 1.02 (ref 1.003–1.030)
SQUAMOUS EPITHELIAL: <1 /HPF (ref 0–5)
WBC UA: 1 /HPF (ref <=2)

## 2020-02-05 LAB — LEUKOCYTE ESTERASE UA: Leukocyte esterase:PrThr:Pt:Urine:Ord:Test strip: NEGATIVE

## 2020-02-05 LAB — CREATININE, URINE: Lab: 118

## 2020-02-05 MED ORDER — OMEPRAZOLE 20 MG CAPSULE,DELAYED RELEASE
ORAL_CAPSULE | Freq: Every day | ORAL | 2 refills | 30.00000 days | Status: CP
Start: 2020-02-05 — End: 2021-02-04
  Filled 2020-02-04: qty 15, 30d supply, fill #1

## 2020-02-05 NOTE — Unmapped (Signed)
AOBP:Right arm  small cuff   Average:103/66 Pulse:92  1st reading:104/69 Pulse:92  2nd reading:98/62 Pulse:91  3rd reading:106/68 Pulse:93

## 2020-02-05 NOTE — Unmapped (Signed)
Transplant Nephrology Clinic Visit    History of Present Illness  [name] is a 59 y.o. 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  -Adequate perfusion of the transplant kidney. Resistive indices within the mid main renal artery and at the anastomosis are slightly elevated. Attention on follow-up.  ??  Current Immunosuppression Regimen:   - Myfortic 360mg  BID  - Envarsus 2mg  QD    Subjective/Interval:   Since patient's last visit in the transplant clinic - patient has been doing well in terms of transplant, taking transplant medications regularly, no episodes of rejection and no side effects of medications.    He was evaluated by ENT in the interim, with audiogram demonstrating normal hearing. He also had normal eye exam with Ophthalmology on 01/23/20.    Today, he reports home BG have been elevated. Home BP and HR have decreased since increasing his antihypertensive dose at last visit; average BP 100-110s systolic. Denies any dizziness or lightheadedness. He is able to ambulate without difficulty. Appetite is good, and he is tolerating regular diet without any nausea or vomiting. Denies chest pain or shortness of breath.    Takes his Envarsus at 7:15 am every morning.     Review of Systems  Otherwise as per HPI, all other systems reviewed and are negative.    Medications  Current Outpatient Medications   Medication Sig Dispense Refill   ??? allopurinoL (ZYLOPRIM) 100 MG tablet Take 1 tablet (100 mg total) by mouth daily. 30 tablet 11   ??? aspirin (ECOTRIN) 81 MG tablet Take 1 tablet (81 mg total) by mouth daily. 30 tablet 11   ??? biotin 5 mg cap Take 1 capsule (5,000 mcg total) by mouth daily.  0   ??? calcitrioL (ROCALTROL) 0.5 MCG capsule Take 1 capsule (0.5 mcg total) by mouth Two (2) times a day. 60 capsule 11   ??? magnesium oxide-Mg AA chelate (MAGNESIUM, AMINO ACID CHELATE,) 133 mg Tab Take 3 tablets by mouth Two (2) times a day. 180 tablet 5   ??? metFORMIN (GLUCOPHAGE-XR) 500 MG 24 hr tablet Take 1 tablet (500 mg total) by mouth daily with evening meal. 30 tablet 11   ??? metoprolol succinate (TOPROL XL) 25 MG 24 hr tablet Take 2 tablets (50 mg total) by mouth nightly. 60 tablet 11   ??? MYFORTIC 180 mg EC tablet Take 2 tablets (360 mg total) by mouth Two (2) times a day. 120 tablet 11   ??? omeprazole (PRILOSEC) 20 MG capsule Take 1 capsule (20 mg total) by mouth every other day. 15 capsule 2   ??? tacrolimus (ENVARSUS XR) 1 mg Tb24 extended release tablet Take  2 tablets (2 mg total) by mouth daily. 60 tablet 11     No current facility-administered medications for this visit.        Physical Exam  BP 103/66 (BP Site: R Arm, BP Position: Sitting, BP Cuff Size: Small)  - Pulse 92  - Temp 36.8 ??C (98.3 ??F) (Temporal)  - Ht 162.6 cm (5' 4)  - Wt 65.2 kg (143 lb 12.8 oz)  - BMI 24.68 kg/m??   General: Well appearing male, no acute distress  HEENT: wearing mask, EOMI, sclera anicteric;   Neck: wearing mask  CV: normal rate, normal rhythm, no murmur, no gallops, no rubs appreciated  Lungs: clear to auscultation bilaterally, normal work of breathing.  Abdomen: soft, non tender.  Extremities: no edema  Musculoskeletal: no visible deformity, normal range of motion.  Pulses: intact distally throughout  Neurologic: awake, alert, and oriented x3    Laboratory Data and Imaging reviewed in EPIC    Assessment: [name] is a 59 y.o. 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     Recommendations/Plan:   Allograft Function: renal function stable at 0.68 with a baseline of approximately 0.7-0.9 since transplant. Will continue to monitor.     Immunosuppression Management [High Risk Medical Decision Making For Drug Therapy Requiring Intensive Monitoring For Toxicity]:  Continue Envarsus 2 mg daily. Targeting tacrolimus trough level of approximately 7-9. Will increase Myfortic back up to 540 mg BID (had decreased due to leukopenia, but most recent WBC was improved).    Blood Pressure Management: BP 103/66 at this visit. Continue Toprol-XL 50 mg at night. BP and heart rate have improved since the last dose increase. EKG done in 11/2019 was normal. Will schedule an appointment with Cardiology.     Infectious Prophylaxis and Monitoring:   Transitioned from Bactrim to Dapsone on 07/04/19 due to stable mild hyperkalemia.  No decoys present 09/03/19. Last CMV VL detected at 146 (12/24/19). Holding Valcyte.    Pre-diabetes: Currently on metformin 500 mg daily. Home BG have been intermittently elevated; however, last A1c was 5.9 in 12/2019.Will monitor.    Hypercalcemia: Persistent elevated but its better now.   - s/p parathyroid surgery - 11/11/2019    Lower Back Pain with midline radiculopathy: Known underlying degenerative changes. Resolved per Patient.     Atrial Fibrillation: Last EKG showed NSR.  Patient saw his cardiologist on 05/21/19 (care everywhere).  Echo on 05/08/19 showed EF 50-55%.      Health Maintenance:   - Colonoscopy due 2029    Immunizations:  - Vaccination: PCV-23 completed 07/06/2015.   -Flu shot: 08/06/19  - Prevnar 13 to be given at 1 year post    Counseling:  I counseled the patient on the need to avoid sun exposure and the use of sunblock while outdoors given the relatively higher risk of skin malignancy in an immunosuppressed state and the need for adherence to immunosuppression medication.  Patient verbalized understanding.     Follow-Up:  Return to clinic in 2 months  Patient will continue to follow-up with his primary care provider for non-transplant related issues and medication refills. We have ordered transplant specific labs per the center's guidelines to monitor and assess for toxicities from immunosuppressant drug therapy      Scribe's Attestation: Leeroy Bock, MD obtained and performed the history, physical exam and medical decision making elements that were entered into the chart.  Signed by  Gaye Alken, Scribe, on February 05, 2020 at 11:05 AM.    Documentation assistance provided by the Scribe. I was present during the time the encounter was recorded. The information recorded by the Scribe was done at my direction and has been reviewed and validated by me.

## 2020-02-05 NOTE — Unmapped (Signed)
Transplant Coordinator, Clinic Visit   Pt seen today by transplant nephrology and Pharmacist for follow up.       Assessment  BP: at home BP 103/66  BG: n/a  Headache: denies  Hand tremors: denies  Numbness/tingling: denies  Fevers: denies  Chills/sweats: denies  Shortness of breath: denies  Chest pain or pressure: denies  Palpitations: denies  Nausea/vomiting: denies  Diarrhea/constipation: denies  UTI symptoms: denies  Swelling: deines  Sleep: report adequate sleep  Pain: deines  Good appetite; reports adequate hydration.     Medication changes per MD. Myfortic 540 mg BID and Prilosec daily  Any new medications? Pt. Reports no new medication    Immunosuppressant last taken: Envarus taken at @ 7 am on 3/2   Patient educated on COVID education and protecting himself. Patient has no issues obtaining his medications. And no other needs from his coordinator at this time

## 2020-02-10 ENCOUNTER — Ambulatory Visit: Admit: 2020-02-10 | Discharge: 2020-02-11 | Payer: MEDICARE

## 2020-02-10 DIAGNOSIS — Z940 Kidney transplant status: Principal | ICD-10-CM

## 2020-02-10 LAB — CBC W/ AUTO DIFF
BASOPHILS RELATIVE PERCENT: 0.6 %
EOSINOPHILS ABSOLUTE COUNT: 0.7 10*9/L (ref 0.0–0.7)
EOSINOPHILS RELATIVE PERCENT: 10.9 %
HEMOGLOBIN: 14.1 g/dL (ref 13.5–17.5)
LYMPHOCYTES ABSOLUTE COUNT: 0.7 10*9/L (ref 0.7–4.0)
LYMPHOCYTES RELATIVE PERCENT: 10.6 %
MEAN CORPUSCULAR HEMOGLOBIN CONC: 34.2 g/dL (ref 30.0–36.0)
MEAN CORPUSCULAR HEMOGLOBIN: 30.7 pg (ref 26.0–34.0)
MEAN CORPUSCULAR VOLUME: 89.8 fL (ref 81.0–95.0)
MEAN PLATELET VOLUME: 8.3 fL (ref 7.0–10.0)
MONOCYTES ABSOLUTE COUNT: 0.5 10*9/L (ref 0.1–1.0)
NEUTROPHILS ABSOLUTE COUNT: 4.3 10*9/L (ref 1.7–7.7)
NEUTROPHILS RELATIVE PERCENT: 70.1 %
PLATELET COUNT: 216 10*9/L (ref 150–450)
RED BLOOD CELL COUNT: 4.59 10*12/L (ref 4.32–5.72)
RED CELL DISTRIBUTION WIDTH: 14 % (ref 12.0–15.0)
WBC ADJUSTED: 6.2 10*9/L (ref 3.5–10.5)

## 2020-02-10 LAB — BASIC METABOLIC PANEL
ANION GAP: 13 mmol/L (ref 7–15)
BLOOD UREA NITROGEN: 25 mg/dL — ABNORMAL HIGH (ref 7–21)
BUN / CREAT RATIO: 37
CALCIUM: 10.1 mg/dL (ref 8.5–10.2)
CHLORIDE: 104 mmol/L (ref 98–107)
CO2: 23 mmol/L (ref 22.0–30.0)
CREATININE: 0.67 mg/dL — ABNORMAL LOW (ref 0.70–1.30)
EGFR CKD-EPI NON-AA MALE: >90 mL/min/{1.73_m2} (ref >=60)
GLUCOSE RANDOM: 150 mg/dL (ref 70–179)
POTASSIUM: 4.7 mmol/L (ref 3.5–5.0)
SODIUM: 140 mmol/L (ref 135–145)

## 2020-02-10 LAB — NEUTROPHILS ABSOLUTE COUNT: Neutrophils:NCnc:Pt:Bld:Qn:Automated count: 4.3

## 2020-02-10 LAB — PHOSPHORUS: Phosphate:MCnc:Pt:Ser/Plas:Qn:: 3

## 2020-02-10 LAB — POTASSIUM: Potassium:SCnc:Pt:Ser/Plas:Qn:: 4.7

## 2020-02-10 LAB — TACROLIMUS, TROUGH: Lab: 7.1

## 2020-02-10 LAB — MAGNESIUM: Magnesium:MCnc:Pt:Ser/Plas:Qn:: 1.7

## 2020-02-10 MED ORDER — OMEPRAZOLE 20 MG CAPSULE,DELAYED RELEASE
ORAL_CAPSULE | Freq: Every day | ORAL | 11 refills | 30.00000 days | Status: CP
Start: 2020-02-10 — End: 2021-02-09
  Filled 2020-02-19: qty 30, 30d supply, fill #0

## 2020-02-10 MED ORDER — MYFORTIC 180 MG TABLET,DELAYED RELEASE
ORAL_TABLET | Freq: Two times a day (BID) | ORAL | 11 refills | 30 days | Status: CP
Start: 2020-02-10 — End: 2021-02-09
  Filled 2020-02-27: qty 180, 30d supply, fill #0

## 2020-02-10 NOTE — Unmapped (Signed)
Change in Myfortic dosage increase. Co-pay $0.00. Last filled 02/04/2020 on Part B.

## 2020-02-10 NOTE — Unmapped (Signed)
Medications updated

## 2020-02-11 LAB — CMV DNA, QUANTITATIVE, PCR: CMV VIRAL LD: NOT DETECTED

## 2020-02-11 LAB — CMV COMMENT: Lab: 0

## 2020-02-11 NOTE — Progress Notes (Signed)
Evaluation Performed:  Follow-up visit  Date:  02/12/2020   ID:  Matthew Mathis, DOB 07/14/1961, MRN [id number]  Patient Location:  [address] [place] [address]   Provider location:   [facility], [place] office  PCP:  Verita Lamb, NP  Cardiologist:  Arvid Right [facility]   Chief Complaint  Patient presents with  . other    6 month follow up. Meds reviewed by the pt. verbally. "doing well."      History of Present Illness:    Matthew Mathis is a 59 y.o. 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  Hypertension, and hypothyroidism,  Hospital admission  new atrial fibrillation June 2020,no symptoms  which terminated with IV diltiazem. He presents today for follow-up of his atrial fibrillation  Renal transplant 05/2019 Parathyroid surgery  Sinus congestion at night, Noticed faster heart rate at night likely secondary to congestion Nephrology increased metoprolol up to 50 daily Symptoms improved  Active, jogging 1 hour per day No shortness of breath or chest discomfort on exertion  Had post tranplant "diabetes" Changed diet  Lab work reviewed Glucose 150 HBA1C 5.9   Weight has been slowly trending upwards  EKG personally reviewed by myself on todays visit Shows normal sinus rhythm rate 79 bpm no significant ST or T wave changes  Prior CV studies:   The following studies were reviewed today:  Echocardiogram May 08, 2019   1. The left ventricle has low normal systolic function, with an ejection fraction of 50-55%. The cavity size was normal. There is mildly increased left ventricular wall thickness. Left ventricular diastolic parameters were normal.  2. The right ventricle has normal systolic function. The cavity was normal. There is no increase in right ventricular wall thickness.  3. Left atrial size was mildly dilated.  4. The mitral valve  is grossly normal. Mitral valve regurgitation is moderate by color flow Doppler. In select views, regurgitation is more mild to moderate. The MR jet is eccentric posteriorly directed.   Past Medical History:  Diagnosis Date  . Chronic renal failure    hx   . Gout    hx  . HTN (hypertension)    Past Surgical History:  Procedure Laterality Date  . CAPD INSERTION Right 05/24/2017   Procedure: LAPAROSCOPIC INSERTION CONTINUOUS AMBULATORY PERITONEAL DIALYSIS  (CAPD) CATHETER;  Surgeon: Katha Cabal, MD;  Location: [facility] ORS;  Service: Vascular;  Laterality: Right;  . DIALYSIS/PERMA CATHETER INSERTION N/A 05/07/2018   Procedure: DIALYSIS/PERMA CATHETER INSERTION;  Surgeon: Algernon Huxley, MD;  Location: [place] CV [facility];  Service: Cardiovascular;  Laterality: N/A;  . DIALYSIS/PERMA CATHETER REMOVAL N/A 06/27/2017   Procedure: Dialysis/Perma Catheter Removal;  Surgeon: Katha Cabal, MD;  Location: [place] CV [facility];  Service: Cardiovascular;  Laterality: N/A;  . DIALYSIS/PERMA CATHETER REMOVAL N/A 07/18/2018   Procedure: DIALYSIS/PERMA CATHETER REMOVAL;  Surgeon: Katha Cabal, MD;  Location: [place] CV LAB;  Service: Cardiovascular;  Laterality: N/A;  . INSERTION OF DIALYSIS CATHETER Right 05/24/2017   Procedure: INSERTION OF DIALYSIS CATHETER ( TUNNELED CATH );  Surgeon: Katha Cabal, MD;  Location: [facility] OR[facility];  Service: Vascular;  Laterality: Right;  . KIDNEY TRANSPLANT  05/2019   left kidney AT [place]    . PERITONEAL CATHETER INSERTION    . REMOVAL OF A DIALYSIS CATHETER Left 05/24/2017  Procedure: REMOVAL OF A DIALYSIS CATHETER ( PD CATH );  Surgeon: Katha Cabal, MD;  Location: [facility] OR[facility];  Service: Vascular;  Laterality: Left;  . RENAL BIOPSY     non specific      Current Meds  Medication Sig  . allopurinol (ZYLOPRIM) 100 MG tablet Take 100 mg by mouth every evening.   [name] aspirin EC 81 MG tablet Take 1 tablet (81 mg total) by  mouth daily.  . Biotin 5 MG CAPS Take by mouth daily.   . calcium acetate (PHOSLO) 667 MG capsule Take 667-1,334 mg by mouth See admin instructions. Take 3 capsules (1334mg ) by mouth three times a day with meals and take 1 capsule (667mg ) by mouth with snacks - total 8 capsules daily  . metFORMIN (GLUCOPHAGE) 500 MG tablet Take by mouth 2 (two) times daily with a meal.  . Metoprolol Succinate 25 MG CS24 Take 50 mg by mouth daily.  . Multiple Vitamins-Minerals (DIALYVITE 800/ULTRA D) TABS Take 1 tablet by mouth daily.  . mycophenolate (MYFORTIC) 180 MG EC tablet Take by mouth.  [name] omeprazole (PRILOSEC) 20 MG capsule Take 20 mg by mouth daily.  . Skin Protectants, Misc. (EUCERIN) cream Apply 1 application topically as needed for dry skin.  [name] Specialty Vitamins Products (MAGNESIUM, AMINO ACID CHELATE,) 133 MG tablet Take 1 tablet by mouth 2 (two) times daily.   . Tacrolimus ER (ENVARSUS XR) 1 MG TB24 Take 2 tablets by mouth daily.     Allergies:   Patient has no known allergies.   Social History   Tobacco Use  . Smoking status: Former Smoker    Packs/day: 0.50    Types: Cigarettes    Quit date: 12/04/2010    Years since quitting: 9.1  . Smokeless tobacco: Never Used  . Tobacco comment: used to smoke 2-3 ppd, now smokes 3 cigarettes daily   Substance Use Topics  . Alcohol use: No  . Drug use: No     Current Outpatient Medications on File Prior to Visit  Medication Sig Dispense Refill  . allopurinol (ZYLOPRIM) 100 MG tablet Take 100 mg by mouth every evening.     [name] aspirin EC 81 MG tablet Take 1 tablet (81 mg total) by mouth daily. 30 tablet 0  . Biotin 5 MG CAPS Take by mouth daily.     . calcium acetate (PHOSLO) 667 MG capsule Take 667-1,334 mg by mouth See admin instructions. Take 3 capsules (1334mg ) by mouth three times a day with meals and take 1 capsule (667mg ) by mouth with snacks - total 8 capsules daily    . metFORMIN (GLUCOPHAGE) 500 MG tablet Take by mouth 2 (two) times daily  with a meal.    . Metoprolol Succinate 25 MG CS24 Take 50 mg by mouth daily.    . Multiple Vitamins-Minerals (DIALYVITE 800/ULTRA D) TABS Take 1 tablet by mouth daily.  3  . mycophenolate (MYFORTIC) 180 MG EC tablet Take by mouth.    [name] omeprazole (PRILOSEC) 20 MG capsule Take 20 mg by mouth daily.    . Skin Protectants, Misc. (EUCERIN) cream Apply 1 application topically as needed for dry skin.    [name] Specialty Vitamins Products (MAGNESIUM, AMINO ACID CHELATE,) 133 MG tablet Take 1 tablet by mouth 2 (two) times daily.     . Tacrolimus ER (ENVARSUS XR) 1 MG TB24 Take 2 tablets by mouth daily.     No current facility-administered medications on file prior to visit.     Family  Hx: The patient's family history includes CAD in his father; Diabetes in his father; Hypertension in his mother; Kidney disease in his mother.  ROS:   Please see the history of present illness.    Review of Systems  Constitutional: Negative.   HENT: Negative.   Respiratory: Negative.   Cardiovascular: Negative.   Gastrointestinal: Negative.   Musculoskeletal: Negative.   Neurological: Negative.   Psychiatric/Behavioral: Negative.   All other systems reviewed and are negative.    Labs/Other Tests and Data Reviewed:    Recent Labs: 05/07/2019: TSH 0.487 05/08/2019: ALT 23 05/09/2019: BUN 59; Creatinine, Ser 8.46; Hemoglobin 9.1; Magnesium 2.1; Platelets 178; Potassium 5.2; Sodium 138   Recent Lipid Panel No results found for: CHOL, TRIG, HDL, CHOLHDL, LDLCALC, LDLDIRECT  Wt Readings from Last 3 Encounters:  02/12/20 145 lb (65.8 kg)  05/09/19 145 lb 9.6 oz (66 kg)  12/14/18 154 lb (69.9 kg)     Exam:    Vital Signs: Vital signs may also be detailed in the HPI BP 110/64 (BP Location: Left Arm, Patient Position: Sitting, Cuff Size: Normal)   Pulse 79   Ht 5\' 4"  (1.626 m)   Wt 145 lb (65.8 kg)   SpO2 98%   BMI 24.89 kg/m    Constitutional:  oriented to person, place, and time. No distress.  HENT:    Head: Grossly normal Eyes:  no discharge. No scleral icterus.  Neck: No JVD, no carotid bruits  Cardiovascular: Regular rate and rhythm, no murmurs appreciated Pulmonary/Chest: Clear to auscultation bilaterally, no wheezes or rails Abdominal: Soft.  no distension.  no tenderness.  Musculoskeletal: Normal range of motion Neurological:  normal muscle tone. Coordination normal. No atrophy Skin: Skin warm and dry Psychiatric: normal affect, pleasant   ASSESSMENT & PLAN:    Atrial fibrillation with rapid ventricular response (HCC) - Low chads VASC, not on anticoagulation On metoprolol 50 daily, maintaining normal sinus rhythm  HYPERTENSION, BENIGN - Blood pressure is well controlled on today's visit. No changes made to the medications. No significant orthostasis  ESRD (end stage renal disease) ([place]) - Previously on hemodialysis, underwent kidney transplant June 2020 at [facility] fluid intake   Total encounter time more than 25 minutes  Greater than 50% was spent in counseling and coordination of care with the patient  Disposition: Follow-up in 12 months   Signed, Ida Rogue, MD  02/12/2020 2:19 PM    [place] [facility] [address], [place], [place] [address]

## 2020-02-12 ENCOUNTER — Ambulatory Visit (INDEPENDENT_AMBULATORY_CARE_PROVIDER_SITE_OTHER): Payer: Medicare Other | Admitting: Cardiovascular Disease

## 2020-02-12 ENCOUNTER — Other Ambulatory Visit: Payer: Self-pay

## 2020-02-12 ENCOUNTER — Encounter: Payer: Self-pay | Admitting: Cardiovascular Disease

## 2020-02-12 VITALS — BP 110/64 | HR 79 | Ht 64.0 in | Wt 145.0 lb

## 2020-02-12 DIAGNOSIS — I150 Renovascular hypertension: Secondary | ICD-10-CM | POA: Diagnosis not present

## 2020-02-12 DIAGNOSIS — I10 Essential (primary) hypertension: Secondary | ICD-10-CM

## 2020-02-12 DIAGNOSIS — N186 End stage renal disease: Secondary | ICD-10-CM

## 2020-02-12 DIAGNOSIS — I4891 Unspecified atrial fibrillation: Secondary | ICD-10-CM

## 2020-02-12 NOTE — Patient Instructions (Addendum)
Medication Instructions:  No changes  If you need a refill on your cardiac medications before your next appointment, please call your pharmacy.    Lab work: No new labs needed   If you have labs (blood work) drawn today and your tests are completely normal, you will receive your results only by: . MyChart Message (if you have MyChart) OR . A paper copy in the mail If you have any lab test that is abnormal or we need to change your treatment, we will call you to review the results.   Testing/Procedures: No new testing needed   Follow-Up: At [facility], you and your health needs are our priority.  As part of our continuing mission to provide you with exceptional heart care, we have created designated Provider Care Teams.  These Care Teams include your primary Cardiologist (physician) and Advanced Practice Providers (APPs -  Physician Assistants and Nurse Practitioners) who all work together to provide you with the care you need, when you need it.  . You will need a follow up appointment in 12 months  . Providers on your designated Care Team:   . Christopher Berge, NP . Ryan Dunn, PA-C . Jacquelyn Visser, PA-C  Any Other Special Instructions Will Be Listed Below (If Applicable).  COVID-19 Vaccine Information can be found at: [email or web address] For questions related to vaccine distribution or appointments, please email [email or web address] or call [phone].     

## 2020-02-17 ENCOUNTER — Ambulatory Visit: Admit: 2020-02-17 | Discharge: 2020-02-18 | Payer: MEDICARE

## 2020-02-17 LAB — PHOSPHORUS: Phosphate:MCnc:Pt:Ser/Plas:Qn:: 3.2

## 2020-02-17 LAB — CBC W/ AUTO DIFF
BASOPHILS ABSOLUTE COUNT: 0.1 10*9/L (ref 0.0–0.1)
BASOPHILS RELATIVE PERCENT: 1 %
EOSINOPHILS ABSOLUTE COUNT: 0.8 10*9/L — ABNORMAL HIGH (ref 0.0–0.7)
EOSINOPHILS RELATIVE PERCENT: 11.6 %
HEMATOCRIT: 43.1 % (ref 38.0–50.0)
HEMOGLOBIN: 14.6 g/dL (ref 13.5–17.5)
LYMPHOCYTES ABSOLUTE COUNT: 0.7 10*9/L (ref 0.7–4.0)
LYMPHOCYTES RELATIVE PERCENT: 9.9 %
MEAN CORPUSCULAR HEMOGLOBIN CONC: 33.8 g/dL (ref 30.0–36.0)
MEAN CORPUSCULAR VOLUME: 89.8 fL (ref 81.0–95.0)
MEAN PLATELET VOLUME: 8.5 fL (ref 7.0–10.0)
MONOCYTES ABSOLUTE COUNT: 0.5 10*9/L (ref 0.1–1.0)
MONOCYTES RELATIVE PERCENT: 6.7 %
NEUTROPHILS RELATIVE PERCENT: 70.8 %
NUCLEATED RED BLOOD CELLS: 0 /100{WBCs} (ref <=4)
PLATELET COUNT: 225 10*9/L (ref 150–450)
RED BLOOD CELL COUNT: 4.8 10*12/L (ref 4.32–5.72)
RED CELL DISTRIBUTION WIDTH: 14.2 % (ref 12.0–15.0)
WBC ADJUSTED: 6.8 10*9/L (ref 3.5–10.5)

## 2020-02-17 LAB — CO2: Carbon dioxide:SCnc:Pt:Ser/Plas:Qn:: 25

## 2020-02-17 LAB — TACROLIMUS, TROUGH: Lab: 6.1

## 2020-02-17 LAB — BASIC METABOLIC PANEL
BLOOD UREA NITROGEN: 25 mg/dL — ABNORMAL HIGH (ref 7–21)
BUN / CREAT RATIO: 38
CALCIUM: 10.3 mg/dL — ABNORMAL HIGH (ref 8.5–10.2)
CHLORIDE: 104 mmol/L (ref 98–107)
CO2: 25 mmol/L (ref 22.0–30.0)
CREATININE: 0.65 mg/dL — ABNORMAL LOW (ref 0.70–1.30)
EGFR CKD-EPI AA MALE: >90 mL/min/{1.73_m2} (ref >=60)
EGFR CKD-EPI NON-AA MALE: >90 mL/min/{1.73_m2} (ref >=60)
POTASSIUM: 4.5 mmol/L (ref 3.5–5.0)
SODIUM: 141 mmol/L (ref 135–145)

## 2020-02-17 LAB — MAGNESIUM: Magnesium:MCnc:Pt:Ser/Plas:Qn:: 1.7

## 2020-02-17 LAB — NUCLEATED RED BLOOD CELLS: Lab: 0

## 2020-02-18 LAB — CMV DNA, QUANTITATIVE, PCR

## 2020-02-18 LAB — CMV QUANT: Lab: 60 — ABNORMAL HIGH

## 2020-02-19 MED FILL — METOPROLOL SUCCINATE ER 25 MG TABLET,EXTENDED RELEASE 24 HR: ORAL | 30 days supply | Qty: 60 | Fill #2

## 2020-02-19 MED FILL — METOPROLOL SUCCINATE ER 25 MG TABLET,EXTENDED RELEASE 24 HR: 30 days supply | Qty: 60 | Fill #2 | Status: AC

## 2020-02-19 MED FILL — OMEPRAZOLE 20 MG CAPSULE,DELAYED RELEASE: 30 days supply | Qty: 30 | Fill #0 | Status: AC

## 2020-02-19 NOTE — Unmapped (Signed)
[facility] Refill Coordination Note    Specialty Medication(s) to be Shipped:   Transplant: Envarsus 1mg  and Myfortic 180mg     Other medication(s) to be shipped: ASA, Omeprazole 20mg , Allopurinol 100mg  and metoprolol succ 25mg      Louis Dennis, DOB: 02-05-1961  Phone: [phone] (home)       All above HIPAA information was verified with patient.     Was a Nurse, learning disability used for this call? Yes, [place]. Patient language is appropriate in [facility]    Completed refill call assessment today to schedule patient's medication shipment from the [facility] [phone]).       Specialty medication(s) and dose(s) confirmed: Patient reports changes to the regimen as follows: Myfortic - Take 3 tablets (540 mg total) by mouth Two (2) times a day. **new rx is on profile**   Changes to medications: [place] reports no changes at this time.  Changes to insurance: No  Questions for the pharmacist: No    Confirmed patient received Welcome Packet with first shipment. The patient will receive a drug information handout for each medication shipped and additional FDA Medication Guides as required.       DISEASE/MEDICATION-SPECIFIC INFORMATION        N/A    SPECIALTY MEDICATION ADHERENCE     Medication Adherence    Patient reported X missed doses in the last month: 0  Specialty Medication: Envarsus 1mg   Patient is on additional specialty medications: Yes  Additional Specialty Medications: Myfortic 180mg   Patient Reported Additional Medication X Missed Doses in the Last Month: 0  Patient is on more than two specialty medications: No          Envarsus 1 mg: 14 days of medicine on hand   Myfortic 180 mg: 14 days of medicine on hand     SHIPPING     Shipping address confirmed in Epic.     Delivery Scheduled: Yes, Expected medication delivery date: 02/28/2020.      Metoprolol and omeprazole delivery on 02/20/2020    Medication will be delivered via Next Day Courier to the prescription address in Epic WAM.    [name]   [facility] Specialty Technician

## 2020-02-24 ENCOUNTER — Ambulatory Visit: Admit: 2020-02-24 | Discharge: 2020-02-25 | Payer: MEDICARE

## 2020-02-24 LAB — BASIC METABOLIC PANEL
ANION GAP: 13 mmol/L (ref 7–15)
BLOOD UREA NITROGEN: 26 mg/dL — ABNORMAL HIGH (ref 7–21)
CALCIUM: 10.1 mg/dL (ref 8.5–10.2)
CO2: 25 mmol/L (ref 22.0–30.0)
CREATININE: 0.65 mg/dL — ABNORMAL LOW (ref 0.70–1.30)
EGFR CKD-EPI AA MALE: >90 mL/min/{1.73_m2} (ref >=60)
EGFR CKD-EPI NON-AA MALE: >90 mL/min/{1.73_m2} (ref >=60)
GLUCOSE RANDOM: 152 mg/dL (ref 70–179)
POTASSIUM: 4.8 mmol/L (ref 3.5–5.0)
SODIUM: 142 mmol/L (ref 135–145)

## 2020-02-24 LAB — CBC W/ AUTO DIFF
BASOPHILS ABSOLUTE COUNT: 0.1 10*9/L (ref 0.0–0.1)
BASOPHILS RELATIVE PERCENT: 0.9 %
EOSINOPHILS ABSOLUTE COUNT: 0.8 10*9/L — ABNORMAL HIGH (ref 0.0–0.7)
EOSINOPHILS RELATIVE PERCENT: 13.6 %
HEMOGLOBIN: 14.1 g/dL (ref 13.5–17.5)
LYMPHOCYTES ABSOLUTE COUNT: 0.6 10*9/L — ABNORMAL LOW (ref 0.7–4.0)
MEAN CORPUSCULAR HEMOGLOBIN CONC: 33.8 g/dL (ref 30.0–36.0)
MEAN CORPUSCULAR HEMOGLOBIN: 30.2 pg (ref 26.0–34.0)
MEAN CORPUSCULAR VOLUME: 89.2 fL (ref 81.0–95.0)
MEAN PLATELET VOLUME: 9.2 fL (ref 7.0–10.0)
MONOCYTES ABSOLUTE COUNT: 0.5 10*9/L (ref 0.1–1.0)
MONOCYTES RELATIVE PERCENT: 8 %
NEUTROPHILS RELATIVE PERCENT: 68 %
NUCLEATED RED BLOOD CELLS: 0 /100{WBCs} (ref <=4)
PLATELET COUNT: 208 10*9/L (ref 150–450)
RED BLOOD CELL COUNT: 4.68 10*12/L (ref 4.32–5.72)
RED CELL DISTRIBUTION WIDTH: 14.5 % (ref 12.0–15.0)
WBC ADJUSTED: 6.1 10*9/L (ref 3.5–10.5)

## 2020-02-24 LAB — TACROLIMUS, TROUGH: Lab: 8.1

## 2020-02-24 LAB — MAGNESIUM: Magnesium:MCnc:Pt:Ser/Plas:Qn:: 1.7

## 2020-02-24 LAB — GLUCOSE RANDOM: Glucose:MCnc:Pt:Ser/Plas:Qn:: 152

## 2020-02-24 LAB — PHOSPHORUS: Phosphate:MCnc:Pt:Ser/Plas:Qn:: 3

## 2020-02-24 LAB — MONOCYTES RELATIVE PERCENT: Monocytes/100 leukocytes:NFr:Pt:Bld:Qn:Automated count: 8

## 2020-02-26 LAB — CMV DNA, QUANTITATIVE, PCR
CMV QUANT: <50 [IU]/mL — ABNORMAL HIGH (ref <0)
CMV VIRAL LD: DETECTED — AB

## 2020-02-26 LAB — CMV QUANT: Lab: <50 — ABNORMAL HIGH

## 2020-02-27 MED FILL — ENVARSUS XR 1 MG TABLET,EXTENDED RELEASE: ORAL | 30 days supply | Qty: 60 | Fill #2

## 2020-02-27 MED FILL — ASPIRIN 81 MG TABLET,DELAYED RELEASE: 30 days supply | Qty: 30 | Fill #9 | Status: AC

## 2020-02-27 MED FILL — ASPIRIN 81 MG TABLET,DELAYED RELEASE: ORAL | 30 days supply | Qty: 30 | Fill #9

## 2020-02-27 MED FILL — ENVARSUS XR 1 MG TABLET,EXTENDED RELEASE: 30 days supply | Qty: 60 | Fill #2 | Status: AC

## 2020-02-27 MED FILL — MYFORTIC 180 MG TABLET,DELAYED RELEASE: 30 days supply | Qty: 180 | Fill #0 | Status: AC

## 2020-02-28 MED FILL — ALLOPURINOL 100 MG TABLET: ORAL | 30 days supply | Qty: 30 | Fill #8

## 2020-02-28 MED FILL — ALLOPURINOL 100 MG TABLET: 30 days supply | Qty: 30 | Fill #8 | Status: AC

## 2020-03-02 ENCOUNTER — Ambulatory Visit: Admit: 2020-03-02 | Discharge: 2020-03-03 | Payer: MEDICARE

## 2020-03-02 LAB — BASIC METABOLIC PANEL
ANION GAP: 15 mmol/L (ref 7–15)
BLOOD UREA NITROGEN: 26 mg/dL — ABNORMAL HIGH (ref 7–21)
BUN / CREAT RATIO: 38
CALCIUM: 10.3 mg/dL — ABNORMAL HIGH (ref 8.5–10.2)
CO2: 24 mmol/L (ref 22.0–30.0)
CREATININE: 0.68 mg/dL — ABNORMAL LOW (ref 0.70–1.30)
EGFR CKD-EPI AA MALE: >90 mL/min/{1.73_m2} (ref >=60)
EGFR CKD-EPI NON-AA MALE: >90 mL/min/{1.73_m2} (ref >=60)
GLUCOSE RANDOM: 152 mg/dL (ref 70–179)
POTASSIUM: 4.8 mmol/L (ref 3.5–5.0)
SODIUM: 141 mmol/L (ref 135–145)

## 2020-03-02 LAB — CBC W/ AUTO DIFF
BASOPHILS ABSOLUTE COUNT: 0 10*9/L (ref 0.0–0.1)
BASOPHILS RELATIVE PERCENT: 0.6 %
EOSINOPHILS ABSOLUTE COUNT: 0.8 10*9/L — ABNORMAL HIGH (ref 0.0–0.7)
EOSINOPHILS RELATIVE PERCENT: 12.9 %
HEMOGLOBIN: 14.6 g/dL (ref 13.5–17.5)
LYMPHOCYTES ABSOLUTE COUNT: 0.7 10*9/L (ref 0.7–4.0)
LYMPHOCYTES RELATIVE PERCENT: 11.4 %
MEAN CORPUSCULAR HEMOGLOBIN CONC: 33.4 g/dL (ref 30.0–36.0)
MEAN CORPUSCULAR HEMOGLOBIN: 29.9 pg (ref 26.0–34.0)
MEAN CORPUSCULAR VOLUME: 89.5 fL (ref 81.0–95.0)
MONOCYTES ABSOLUTE COUNT: 0.5 10*9/L (ref 0.1–1.0)
MONOCYTES RELATIVE PERCENT: 8.5 %
NEUTROPHILS ABSOLUTE COUNT: 4 10*9/L (ref 1.7–7.7)
NEUTROPHILS RELATIVE PERCENT: 66.6 %
NUCLEATED RED BLOOD CELLS: 0 /100{WBCs} (ref <=4)
PLATELET COUNT: 204 10*9/L (ref 150–450)
RED BLOOD CELL COUNT: 4.87 10*12/L (ref 4.32–5.72)
RED CELL DISTRIBUTION WIDTH: 14.6 % (ref 12.0–15.0)
WBC ADJUSTED: 6 10*9/L (ref 3.5–10.5)

## 2020-03-02 LAB — TACROLIMUS, TROUGH: Lab: 8.1

## 2020-03-02 LAB — MONOCYTES ABSOLUTE COUNT: Monocytes:NCnc:Pt:Bld:Qn:Automated count: 0.5

## 2020-03-02 LAB — POTASSIUM: Potassium:SCnc:Pt:Ser/Plas:Qn:: 4.8

## 2020-03-02 LAB — PHOSPHORUS: Phosphate:MCnc:Pt:Ser/Plas:Qn:: 3.1

## 2020-03-02 LAB — MAGNESIUM: Magnesium:MCnc:Pt:Ser/Plas:Qn:: 1.8

## 2020-03-04 LAB — CMV VIRAL LD: Lab: NOT DETECTED

## 2020-03-04 LAB — CMV DNA, QUANTITATIVE, PCR

## 2020-03-09 ENCOUNTER — Ambulatory Visit: Admit: 2020-03-09 | Discharge: 2020-03-10 | Payer: MEDICARE

## 2020-03-09 LAB — TACROLIMUS, TROUGH: Lab: 6.5

## 2020-03-09 LAB — BASIC METABOLIC PANEL
ANION GAP: 12 mmol/L (ref 7–15)
BLOOD UREA NITROGEN: 22 mg/dL — ABNORMAL HIGH (ref 7–21)
BUN / CREAT RATIO: 37
CALCIUM: 10.1 mg/dL (ref 8.5–10.2)
CHLORIDE: 104 mmol/L (ref 98–107)
CO2: 25 mmol/L (ref 22.0–30.0)
CREATININE: 0.6 mg/dL — ABNORMAL LOW (ref 0.70–1.30)
EGFR CKD-EPI AA MALE: >90 mL/min/{1.73_m2} (ref >=60)
EGFR CKD-EPI NON-AA MALE: >90 mL/min/{1.73_m2} (ref >=60)
GLUCOSE RANDOM: 141 mg/dL (ref 70–179)
POTASSIUM: 4.5 mmol/L (ref 3.5–5.0)
SODIUM: 141 mmol/L (ref 135–145)

## 2020-03-09 LAB — CBC W/ AUTO DIFF
BASOPHILS ABSOLUTE COUNT: 0.1 10*9/L (ref 0.0–0.1)
EOSINOPHILS ABSOLUTE COUNT: 0.9 10*9/L — ABNORMAL HIGH (ref 0.0–0.7)
HEMATOCRIT: 44 % (ref 38.0–50.0)
LYMPHOCYTES ABSOLUTE COUNT: 0.7 10*9/L (ref 0.7–4.0)
LYMPHOCYTES RELATIVE PERCENT: 9.9 %
MEAN CORPUSCULAR HEMOGLOBIN: 29.3 pg (ref 26.0–34.0)
MEAN CORPUSCULAR VOLUME: 88.9 fL (ref 81.0–95.0)
MEAN PLATELET VOLUME: 9.3 fL (ref 7.0–10.0)
MONOCYTES ABSOLUTE COUNT: 0.5 10*9/L (ref 0.1–1.0)
MONOCYTES RELATIVE PERCENT: 7.6 %
NEUTROPHILS ABSOLUTE COUNT: 4.5 10*9/L (ref 1.7–7.7)
NEUTROPHILS RELATIVE PERCENT: 68.3 %
NUCLEATED RED BLOOD CELLS: 0 /100{WBCs} (ref <=4)
PLATELET COUNT: 206 10*9/L (ref 150–450)
RED BLOOD CELL COUNT: 4.95 10*12/L (ref 4.32–5.72)
RED CELL DISTRIBUTION WIDTH: 14.5 % (ref 12.0–15.0)
WBC ADJUSTED: 6.6 10*9/L (ref 3.5–10.5)

## 2020-03-09 LAB — PHOSPHORUS: Phosphate:MCnc:Pt:Ser/Plas:Qn:: 3.1

## 2020-03-09 LAB — MAGNESIUM: Magnesium:MCnc:Pt:Ser/Plas:Qn:: 1.7

## 2020-03-09 LAB — MONOCYTES ABSOLUTE COUNT: Monocytes:NCnc:Pt:Bld:Qn:Automated count: 0.5

## 2020-03-09 LAB — SODIUM: Sodium:SCnc:Pt:Ser/Plas:Qn:: 141

## 2020-03-10 LAB — CMV DNA, QUANTITATIVE, PCR: CMV VIRAL LD: NOT DETECTED

## 2020-03-10 LAB — CMV QUANT: Lab: 0

## 2020-03-16 ENCOUNTER — Ambulatory Visit: Admit: 2020-03-16 | Discharge: 2020-03-17 | Payer: MEDICARE

## 2020-03-16 LAB — CBC W/ AUTO DIFF
BASOPHILS RELATIVE PERCENT: 0.9 %
EOSINOPHILS ABSOLUTE COUNT: 0.8 10*9/L — ABNORMAL HIGH (ref 0.0–0.7)
EOSINOPHILS RELATIVE PERCENT: 13.7 %
HEMOGLOBIN: 14.1 g/dL (ref 13.5–17.5)
LYMPHOCYTES ABSOLUTE COUNT: 0.7 10*9/L (ref 0.7–4.0)
LYMPHOCYTES RELATIVE PERCENT: 10.6 %
MEAN CORPUSCULAR HEMOGLOBIN CONC: 32.7 g/dL (ref 30.0–36.0)
MEAN CORPUSCULAR HEMOGLOBIN: 29.1 pg (ref 26.0–34.0)
MEAN CORPUSCULAR VOLUME: 89.1 fL (ref 81.0–95.0)
MEAN PLATELET VOLUME: 9.1 fL (ref 7.0–10.0)
MONOCYTES ABSOLUTE COUNT: 0.4 10*9/L (ref 0.1–1.0)
MONOCYTES RELATIVE PERCENT: 7 %
NEUTROPHILS ABSOLUTE COUNT: 4.2 10*9/L (ref 1.7–7.7)
NEUTROPHILS RELATIVE PERCENT: 67.8 %
NUCLEATED RED BLOOD CELLS: 0 /100{WBCs} (ref <=4)
PLATELET COUNT: 209 10*9/L (ref 150–450)
RED BLOOD CELL COUNT: 4.83 10*12/L (ref 4.32–5.72)
RED CELL DISTRIBUTION WIDTH: 14.3 % (ref 12.0–15.0)
WBC ADJUSTED: 6.2 10*9/L (ref 3.5–10.5)

## 2020-03-16 LAB — BASIC METABOLIC PANEL
BLOOD UREA NITROGEN: 22 mg/dL — ABNORMAL HIGH (ref 7–21)
BUN / CREAT RATIO: 36
CALCIUM: 9.9 mg/dL (ref 8.5–10.2)
CHLORIDE: 104 mmol/L (ref 98–107)
CO2: 24 mmol/L (ref 22.0–30.0)
EGFR CKD-EPI AA MALE: >90 mL/min/{1.73_m2} (ref >=60)
EGFR CKD-EPI NON-AA MALE: >90 mL/min/{1.73_m2} (ref >=60)
GLUCOSE RANDOM: 144 mg/dL (ref 70–179)
POTASSIUM: 4.4 mmol/L (ref 3.5–5.0)
SODIUM: 141 mmol/L (ref 135–145)

## 2020-03-16 LAB — TACROLIMUS, TROUGH: Lab: 7.3

## 2020-03-16 LAB — MAGNESIUM: Magnesium:MCnc:Pt:Ser/Plas:Qn:: 1.8

## 2020-03-16 LAB — PHOSPHORUS: Phosphate:MCnc:Pt:Ser/Plas:Qn:: 3

## 2020-03-16 LAB — ANION GAP: Anion gap 3:SCnc:Pt:Ser/Plas:Qn:: 13

## 2020-03-16 LAB — BASOPHILS ABSOLUTE COUNT: Basophils:NCnc:Pt:Bld:Qn:Automated count: 0.1

## 2020-03-17 LAB — CMV DNA, QUANTITATIVE, PCR: CMV VIRAL LD: NOT DETECTED

## 2020-03-17 LAB — CMV QUANT: Lab: 0

## 2020-03-19 MED FILL — METOPROLOL SUCCINATE ER 25 MG TABLET,EXTENDED RELEASE 24 HR: ORAL | 30 days supply | Qty: 60 | Fill #3

## 2020-03-19 MED FILL — METOPROLOL SUCCINATE ER 25 MG TABLET,EXTENDED RELEASE 24 HR: 30 days supply | Qty: 60 | Fill #3 | Status: AC

## 2020-03-19 NOTE — Unmapped (Signed)
[facility] Refill Coordination Note    Specialty Medication(s) to be Shipped:   Transplant: Envarsus 1mg  and Myfortic 180mg     Other medication(s) to be shipped: allopurinol 100mg , aspirin 81mg , omeprazole 20mg  and metoprolol succ 25mg      Louis Dennis, DOB: 24-Aug-1961  Phone: [phone] (home)       All above HIPAA information was verified with patient.     Was a Nurse, learning disability used for this call? Yes, [place]. Patient language is appropriate in [facility]    Completed refill call assessment today to schedule patient's medication shipment from the [facility] [phone]).       Specialty medication(s) and dose(s) confirmed: Regimen is correct and unchanged.   Changes to medications: [name] reports no changes at this time.  Changes to insurance: No  Questions for the pharmacist: No    Confirmed patient received Welcome Packet with first shipment. The patient will receive a drug information handout for each medication shipped and additional FDA Medication Guides as required.       DISEASE/MEDICATION-SPECIFIC INFORMATION        N/A    SPECIALTY MEDICATION ADHERENCE     Medication Adherence    Patient reported X missed doses in the last month: 0  Specialty Medication: Envarsus 1mg   Patient is on additional specialty medications: Yes  Additional Specialty Medications: Myfortic 180mg   Patient Reported Additional Medication X Missed Doses in the Last Month: 0  Patient is on more than two specialty medications: No          Envarsus 1 mg: 7 days of medicine on hand   Myfortic 180 mg: 7 days of medicine on hand     SHIPPING     Shipping address confirmed in Epic.     Delivery Scheduled: Yes, Expected medication delivery date: 03/26/2020.      Metoprolol delivery on 03/20/2020    Medication will be delivered via Next Day Courier to the prescription address in Epic WAM.    [name]   [facility] Specialty Technician

## 2020-03-23 ENCOUNTER — Ambulatory Visit: Admit: 2020-03-23 | Discharge: 2020-03-24 | Payer: MEDICARE

## 2020-03-23 LAB — BASIC METABOLIC PANEL
ANION GAP: 14 mmol/L (ref 7–15)
BLOOD UREA NITROGEN: 21 mg/dL (ref 7–21)
CALCIUM: 10 mg/dL (ref 8.5–10.2)
CHLORIDE: 103 mmol/L (ref 98–107)
CO2: 25 mmol/L (ref 22.0–30.0)
CREATININE: 0.61 mg/dL — ABNORMAL LOW (ref 0.70–1.30)
EGFR CKD-EPI AA MALE: >90 mL/min/{1.73_m2} (ref >=60)
EGFR CKD-EPI NON-AA MALE: >90 mL/min/{1.73_m2} (ref >=60)
GLUCOSE RANDOM: 141 mg/dL (ref 70–179)
POTASSIUM: 4.7 mmol/L (ref 3.5–5.0)
SODIUM: 142 mmol/L (ref 135–145)

## 2020-03-23 LAB — TACROLIMUS, TROUGH: Lab: 4.7 — ABNORMAL LOW

## 2020-03-23 LAB — CBC W/ AUTO DIFF
BASOPHILS ABSOLUTE COUNT: 0.1 10*9/L (ref 0.0–0.1)
BASOPHILS RELATIVE PERCENT: 1 %
EOSINOPHILS ABSOLUTE COUNT: 0.8 10*9/L — ABNORMAL HIGH (ref 0.0–0.7)
EOSINOPHILS RELATIVE PERCENT: 12.4 %
HEMOGLOBIN: 14.3 g/dL (ref 13.5–17.5)
LYMPHOCYTES ABSOLUTE COUNT: 0.6 10*9/L — ABNORMAL LOW (ref 0.7–4.0)
LYMPHOCYTES RELATIVE PERCENT: 9.9 %
MEAN CORPUSCULAR HEMOGLOBIN CONC: 33.9 g/dL (ref 30.0–36.0)
MEAN CORPUSCULAR HEMOGLOBIN: 30 pg (ref 26.0–34.0)
MEAN PLATELET VOLUME: 9.5 fL (ref 7.0–10.0)
MONOCYTES ABSOLUTE COUNT: 0.5 10*9/L (ref 0.1–1.0)
MONOCYTES RELATIVE PERCENT: 7.3 %
NEUTROPHILS ABSOLUTE COUNT: 4.4 10*9/L (ref 1.7–7.7)
NUCLEATED RED BLOOD CELLS: 0 /100{WBCs} (ref <=4)
PLATELET COUNT: 180 10*9/L (ref 150–450)
RED BLOOD CELL COUNT: 4.77 10*12/L (ref 4.32–5.72)
RED CELL DISTRIBUTION WIDTH: 14.8 % (ref 12.0–15.0)
WBC ADJUSTED: 6.3 10*9/L (ref 3.5–10.5)

## 2020-03-23 LAB — MAGNESIUM: Magnesium:MCnc:Pt:Ser/Plas:Qn:: 1.8

## 2020-03-23 LAB — PHOSPHORUS: Phosphate:MCnc:Pt:Ser/Plas:Qn:: 2.8 — ABNORMAL LOW

## 2020-03-23 LAB — WBC ADJUSTED: Leukocytes:NCnc:Pt:Bld:Qn:: 6.3

## 2020-03-23 LAB — POTASSIUM: Potassium:SCnc:Pt:Ser/Plas:Qn:: 4.7

## 2020-03-24 LAB — CMV QUANT: Lab: 0

## 2020-03-24 LAB — CMV DNA, QUANTITATIVE, PCR

## 2020-03-25 DIAGNOSIS — Z79899 Other long term (current) drug therapy: Principal | ICD-10-CM

## 2020-03-25 DIAGNOSIS — Z940 Kidney transplant status: Principal | ICD-10-CM

## 2020-03-25 MED FILL — ALLOPURINOL 100 MG TABLET: 30 days supply | Qty: 30 | Fill #9 | Status: AC

## 2020-03-25 MED FILL — ASPIRIN 81 MG TABLET,DELAYED RELEASE: ORAL | 60 days supply | Qty: 60 | Fill #10

## 2020-03-25 MED FILL — OMEPRAZOLE 20 MG CAPSULE,DELAYED RELEASE: ORAL | 30 days supply | Qty: 30 | Fill #1

## 2020-03-25 MED FILL — MYFORTIC 180 MG TABLET,DELAYED RELEASE: ORAL | 30 days supply | Qty: 180 | Fill #1

## 2020-03-25 MED FILL — ASPIRIN 81 MG TABLET,DELAYED RELEASE: 60 days supply | Qty: 60 | Fill #10 | Status: AC

## 2020-03-25 MED FILL — ENVARSUS XR 1 MG TABLET,EXTENDED RELEASE: 30 days supply | Qty: 60 | Fill #3 | Status: AC

## 2020-03-25 MED FILL — MYFORTIC 180 MG TABLET,DELAYED RELEASE: 30 days supply | Qty: 180 | Fill #1 | Status: AC

## 2020-03-25 MED FILL — ENVARSUS XR 1 MG TABLET,EXTENDED RELEASE: ORAL | 30 days supply | Qty: 60 | Fill #3

## 2020-03-25 MED FILL — ALLOPURINOL 100 MG TABLET: ORAL | 30 days supply | Qty: 30 | Fill #9

## 2020-03-25 MED FILL — OMEPRAZOLE 20 MG CAPSULE,DELAYED RELEASE: 30 days supply | Qty: 30 | Fill #1 | Status: AC

## 2020-03-30 ENCOUNTER — Ambulatory Visit: Admit: 2020-03-30 | Discharge: 2020-03-31 | Payer: MEDICARE

## 2020-03-30 LAB — TACROLIMUS LEVEL, TROUGH: TACROLIMUS, TROUGH: 5.2 ng/mL (ref 5.0–15.0)

## 2020-03-30 LAB — CBC W/ AUTO DIFF
BASOPHILS ABSOLUTE COUNT: 0 10*9/L (ref 0.0–0.1)
EOSINOPHILS ABSOLUTE COUNT: 0.9 10*9/L — ABNORMAL HIGH (ref 0.0–0.7)
EOSINOPHILS RELATIVE PERCENT: 9.6 %
HEMATOCRIT: 44.3 % (ref 38.0–50.0)
HEMOGLOBIN: 14.7 g/dL (ref 13.5–17.5)
LYMPHOCYTES ABSOLUTE COUNT: 0.4 10*9/L — ABNORMAL LOW (ref 0.7–4.0)
LYMPHOCYTES RELATIVE PERCENT: 4.4 %
MEAN CORPUSCULAR HEMOGLOBIN CONC: 33.1 g/dL (ref 30.0–36.0)
MEAN CORPUSCULAR HEMOGLOBIN: 29.2 pg (ref 26.0–34.0)
MEAN CORPUSCULAR VOLUME: 88.4 fL (ref 81.0–95.0)
MEAN PLATELET VOLUME: 9.6 fL (ref 7.0–10.0)
MONOCYTES ABSOLUTE COUNT: 0.6 10*9/L (ref 0.1–1.0)
MONOCYTES RELATIVE PERCENT: 6.5 %
NEUTROPHILS ABSOLUTE COUNT: 7.3 10*9/L (ref 1.7–7.7)
NEUTROPHILS RELATIVE PERCENT: 79.2 %
NUCLEATED RED BLOOD CELLS: 0 /100{WBCs} (ref <=4)
PLATELET COUNT: 195 10*9/L (ref 150–450)
RED CELL DISTRIBUTION WIDTH: 15 % (ref 12.0–15.0)
WBC ADJUSTED: 9.2 10*9/L (ref 3.5–10.5)

## 2020-03-30 LAB — TACROLIMUS, TROUGH: Lab: 5.2

## 2020-03-30 LAB — BASIC METABOLIC PANEL
BLOOD UREA NITROGEN: 19 mg/dL (ref 7–21)
BUN / CREAT RATIO: 31
CALCIUM: 9.8 mg/dL (ref 8.5–10.2)
CO2: 24 mmol/L (ref 22.0–30.0)
CREATININE: 0.62 mg/dL — ABNORMAL LOW (ref 0.70–1.30)
EGFR CKD-EPI AA MALE: >90 mL/min/{1.73_m2} (ref >=60)
EGFR CKD-EPI NON-AA MALE: >90 mL/min/{1.73_m2} (ref >=60)
GLUCOSE RANDOM: 136 mg/dL (ref 70–179)
POTASSIUM: 4.7 mmol/L (ref 3.5–5.0)
SODIUM: 139 mmol/L (ref 135–145)

## 2020-03-30 LAB — EOSINOPHILS RELATIVE PERCENT: Eosinophils/100 leukocytes:NFr:Pt:Bld:Qn:Automated count: 9.6

## 2020-03-30 LAB — PHOSPHORUS: Phosphate:MCnc:Pt:Ser/Plas:Qn:: 2.6 — ABNORMAL LOW

## 2020-03-30 LAB — MAGNESIUM: Magnesium:MCnc:Pt:Ser/Plas:Qn:: 1.8

## 2020-03-30 LAB — BUN / CREAT RATIO: Urea nitrogen/Creatinine:MRto:Pt:Ser/Plas:Qn:: 31

## 2020-03-31 DIAGNOSIS — Z940 Kidney transplant status: Principal | ICD-10-CM

## 2020-03-31 LAB — CMV VIRAL LD: Lab: NOT DETECTED

## 2020-03-31 LAB — CMV DNA, QUANTITATIVE, PCR

## 2020-03-31 MED ORDER — TACROLIMUS XR 1 MG TABLET,EXTENDED RELEASE 24 HR
ORAL_TABLET | Freq: Every day | ORAL | 11 refills | 30.00000 days | Status: CP
Start: 2020-03-31 — End: 2021-03-31
  Filled 2020-04-16: qty 90, 30d supply, fill #0

## 2020-03-31 NOTE — Unmapped (Signed)
Change in Envarsus dosage increase. Co-pay $0.00.

## 2020-03-31 NOTE — Unmapped (Signed)
Reviewed tac level with Dr. Nestor Lewandowsky, increasing Envarsus to 3mg  daily. Pt made aware, verbalized understanding. Updated prescription sent to [facility].

## 2020-04-06 ENCOUNTER — Ambulatory Visit: Admit: 2020-04-06 | Discharge: 2020-04-07 | Payer: MEDICARE

## 2020-04-06 LAB — BASIC METABOLIC PANEL
ANION GAP: 10 mmol/L (ref 7–15)
CALCIUM: 9.7 mg/dL (ref 8.5–10.2)
CHLORIDE: 105 mmol/L (ref 98–107)
CO2: 24 mmol/L (ref 22.0–30.0)
CREATININE: 0.6 mg/dL — ABNORMAL LOW (ref 0.70–1.30)
EGFR CKD-EPI AA MALE: >90 mL/min/{1.73_m2} (ref >=60)
EGFR CKD-EPI NON-AA MALE: >90 mL/min/{1.73_m2} (ref >=60)
GLUCOSE RANDOM: 147 mg/dL (ref 70–179)
POTASSIUM: 4.6 mmol/L (ref 3.5–5.0)
SODIUM: 139 mmol/L (ref 135–145)

## 2020-04-06 LAB — CBC W/ AUTO DIFF
BASOPHILS ABSOLUTE COUNT: 0 10*9/L (ref 0.0–0.1)
BASOPHILS RELATIVE PERCENT: 0.8 %
EOSINOPHILS ABSOLUTE COUNT: 0.8 10*9/L — ABNORMAL HIGH (ref 0.0–0.7)
EOSINOPHILS RELATIVE PERCENT: 12.7 %
HEMOGLOBIN: 14.3 g/dL (ref 13.5–17.5)
LYMPHOCYTES ABSOLUTE COUNT: 0.6 10*9/L — ABNORMAL LOW (ref 0.7–4.0)
LYMPHOCYTES RELATIVE PERCENT: 9.6 %
MEAN CORPUSCULAR HEMOGLOBIN CONC: 33.3 g/dL (ref 30.0–36.0)
MEAN CORPUSCULAR HEMOGLOBIN: 30.1 pg (ref 26.0–34.0)
MEAN PLATELET VOLUME: 8.9 fL (ref 7.0–10.0)
MONOCYTES ABSOLUTE COUNT: 0.4 10*9/L (ref 0.1–1.0)
MONOCYTES RELATIVE PERCENT: 6 %
NEUTROPHILS ABSOLUTE COUNT: 4.4 10*9/L (ref 1.7–7.7)
NEUTROPHILS RELATIVE PERCENT: 70.9 %
NUCLEATED RED BLOOD CELLS: 0 /100{WBCs} (ref <=4)
RED BLOOD CELL COUNT: 4.75 10*12/L (ref 4.32–5.72)
RED CELL DISTRIBUTION WIDTH: 14.9 % (ref 12.0–15.0)
WBC ADJUSTED: 6.2 10*9/L (ref 3.5–10.5)

## 2020-04-06 LAB — MAGNESIUM: Magnesium:MCnc:Pt:Ser/Plas:Qn:: 1.7

## 2020-04-06 LAB — ANION GAP: Anion gap 3:SCnc:Pt:Ser/Plas:Qn:: 10

## 2020-04-06 LAB — PHOSPHORUS
PHOSPHORUS: 3 mg/dL (ref 2.9–4.7)
Phosphate:MCnc:Pt:Ser/Plas:Qn:: 3

## 2020-04-06 LAB — TACROLIMUS, TROUGH: Lab: 10.2

## 2020-04-06 LAB — MONOCYTES RELATIVE PERCENT: Monocytes/100 leukocytes:NFr:Pt:Bld:Qn:Automated count: 6

## 2020-04-07 DIAGNOSIS — Z940 Kidney transplant status: Principal | ICD-10-CM

## 2020-04-07 DIAGNOSIS — Z79899 Other long term (current) drug therapy: Principal | ICD-10-CM

## 2020-04-07 LAB — CMV DNA, QUANTITATIVE, PCR: CMV VIRAL LD: DETECTED — AB

## 2020-04-07 LAB — CMV COMMENT: Lab: 0

## 2020-04-08 NOTE — Unmapped (Signed)
[facility] Refill Coordination Note    Specialty Medication(s) to be Shipped:   Transplant: Envarsus 1mg  and Myfortic 180mg     Other medication(s) to be shipped: metoprolol, omeprazole     Louis Dennis, DOB: Dec 26, 1960  Phone: [phone] (home)       All above HIPAA information was verified with patient.     Was a Nurse, learning disability used for this call? No    Completed refill call assessment today to schedule patient's medication shipment from the [facility] [phone]).       Specialty medication(s) and dose(s) confirmed: Regimen is correct and unchanged.   Changes to medications: Louis Dennis reports no changes at this time.  Changes to insurance: No  Questions for the pharmacist: No    Confirmed patient received Welcome Packet with first shipment. The patient will receive a drug information handout for each medication shipped and additional FDA Medication Guides as required.       DISEASE/MEDICATION-SPECIFIC INFORMATION        N/A    SPECIALTY MEDICATION ADHERENCE     Medication Adherence    Patient reported X missed doses in the last month: 0  Specialty Medication: Envarsus  Patient is on additional specialty medications: Yes  Additional Specialty Medications: Myfortic  Patient Reported Additional Medication X Missed Doses in the Last Month: 0  Patient is on more than two specialty medications: No  Any gaps in refill history greater than 2 weeks in the last 3 months: no  Demonstrates understanding of importance of adherence: yes  Informant: patient                Envarsus 1mg : Patient has 20 days of medication on hand    Myfortic 180mg : Patient has 15 days of medication on hand      SHIPPING     Shipping address confirmed in Epic.     Delivery Scheduled: Yes, Expected medication delivery date: 5/14.     Medication will be delivered via Next Day Courier to the prescription address in Epic WAM.    [name]   [facility] Specialty Technician

## 2020-04-10 ENCOUNTER — Ambulatory Visit: Admit: 2020-04-10 | Discharge: 2020-04-10 | Payer: MEDICARE

## 2020-04-10 ENCOUNTER — Ambulatory Visit: Admit: 2020-04-10 | Discharge: 2020-04-10 | Payer: MEDICARE | Attending: Nephrology | Primary: Nephrology

## 2020-04-10 DIAGNOSIS — Z79899 Other long term (current) drug therapy: Principal | ICD-10-CM

## 2020-04-10 DIAGNOSIS — Z940 Kidney transplant status: Principal | ICD-10-CM

## 2020-04-10 DIAGNOSIS — R739 Hyperglycemia, unspecified: Principal | ICD-10-CM

## 2020-04-10 LAB — URINALYSIS
BILIRUBIN UA: NEGATIVE
LEUKOCYTE ESTERASE UA: NEGATIVE
NITRITE UA: NEGATIVE
PH UA: 6.5 (ref 5.0–9.0)
PROTEIN UA: NEGATIVE
RBC UA: <1 /HPF (ref <3)
SPECIFIC GRAVITY UA: 1.02 (ref 1.005–1.030)
SQUAMOUS EPITHELIAL: <1 /HPF (ref 0–5)
WBC UA: <1 /HPF (ref <2)

## 2020-04-10 LAB — PROTEIN / CREATININE RATIO, URINE
CREATININE, URINE: 70.1 mg/dL
PROTEIN/CREAT RATIO, URINE: 0.108

## 2020-04-10 LAB — PROTEIN/CREAT RATIO, URINE: Protein/Creatinine:MRto:Pt:Urine:Qn:: 0.108

## 2020-04-10 LAB — SPECIFIC GRAVITY UA: Specific gravity:Rden:Pt:Urine:Qn:: 1.02

## 2020-04-10 MED ORDER — MG-PLUS-PROTEIN 133 MG TABLET
ORAL_TABLET | Freq: Two times a day (BID) | ORAL | 11 refills | 30.00000 days | Status: CP
Start: 2020-04-10 — End: 2021-04-10

## 2020-04-10 NOTE — Unmapped (Signed)
[facility] H[facility]PIT[facility] TRANSPLANT CLINIC PHARMACY NOTE  04/10/2020  Louis Dennis  [id number]    Medication changes today:  1. Stop aspirin 81 mg daily  2. Stop TUMS    Education/Adherence tools provided today:  1.  provided additional education on immunosuppression and transplant related medications including reviewing indications of medications, dosing and side effects    Follow up items:  1. goal of understanding indications and dosing of immunosuppression medications   2. A1C to determine if patient still needs metformin    Next visit with pharmacy in PRN.  ____________________________________________________________________    Louis Dennis is a 59 y.o. male s/p deceased kidney transplant on 06/22/2019 (Kidney) 2/2 HTN.     Other PMH significant for hypertension, gout    Seen by pharmacy today for: medication management and pill box fill and adherence education; last seen by pharmacy 02/2020.     CC:  Patient has no complaints today.    Vitals:    04/10/20 0856   BP: 116/75   Pulse: 70   Temp: 36.3 ??C (97.3 ??F)       No Known Allergies    All medications reviewed and updated.     Medication list includes revisions made during today???s encounter    Outpatient Encounter Medications as of 04/10/2020   Medication Sig Dispense Refill   ??? allopurinoL (ZYLOPRIM) 100 MG tablet Take 1 tablet (100 mg total) by mouth daily. 30 tablet 11   ??? biotin 5 mg cap Take 1 capsule (5,000 mcg total) by mouth daily.  0   ??? calcitrioL (ROCALTROL) 0.5 MCG capsule Take 1 capsule (0.5 mcg total) by mouth Two (2) times a day. 60 capsule 11   ??? magnesium oxide-Mg AA chelate (MAGNESIUM, AMINO ACID CHELATE,) 133 mg Tab Take 3 tablets by mouth Two (2) times a day. (Patient taking differently: Take 1 tablet by mouth Two (2) times a day. ) 180 tablet 5   ??? metFORMIN (GLUCOPHAGE-XR) 500 MG 24 hr tablet Take 1 tablet (500 mg total) by mouth daily with evening meal. 30 tablet 11   ??? metoprolol succinate (TOPROL XL) 25 MG 24 hr tablet Take 2 tablets (50 mg total) by mouth nightly. 60 tablet 11   ??? MYFORTIC 180 mg EC tablet Take 3 tablets (540 mg total) by mouth Two (2) times a day. 180 tablet 11   ??? omeprazole (PRILOSEC) 20 MG capsule Take 1 capsule (20 mg total) by mouth daily. 30 capsule 11   ??? tacrolimus (ENVARSUS XR) 1 mg Tb24 extended release tablet Take 3 tablets (3 mg total) by mouth daily. 90 tablet 11   ??? aspirin (ECOTRIN) 81 MG tablet Take 1 tablet (81 mg total) by mouth daily. 30 tablet 11     No facility-administered encounter medications on file as of 04/10/2020.     Induction agent: alemtuzumab    CURRENT IMMUNOSUPPRESSION: Envarsus 3 mg daily prograf/Envarsus/cyclosporine goal: 6-8   myfortic540 mg PO bid    steroid free     Patient is tolerating immunosuppression well     IMMUNOSUPPRESSION DRUG LEVELS:  Lab Results   Component Value Date    Tacrolimus, Trough 10.2 04/06/2020    Tacrolimus, Trough 5.2 03/30/2020    Tacrolimus, Trough 4.7 (L) 03/23/2020     No results found for: CYCLO  No results found for: EVEROLIMUS  No results found for: SIROLIMUS    No FK level today.    Graft function: stable   DSA: ntd  Biopsies to date: ntd  WBC/ANC:  wnl    Plan: Will maintain current immunosuppression. Continue to monitor.    OI Prophylaxis:   CMV Status: D+/ R+, moderate risk . CMV prophylaxis: valganciclovir 450 mg daily x 3 months complete  Lab Results   Component Value Date    CMV Quant <50 (H) 04/06/2020    CMV Quant <50 (H) 02/24/2020    CMV Quant 60 (H) 02/17/2020    CMV Quant 128 (H) 01/27/2020    CMV Quant 177 (H) 01/20/2020    CMV Quant 209 (H) 01/13/2020     PCP Prophylaxis: bactrim SS 1 tab MWF -> dapsone 2/2 hyperkalemia x 6 months complete.  Thrush: completed in hospital   Patient is tolerating infectious prophylaxis well    Plan: Continue per protocol. Continue to monitor.    CV Prophylaxis: asa 81 mg   The 10-year ASCVD risk score Denman George DC Jr., et al., 2013) is: 7.1%  Statin therapy: Indicated; currently on no statin  Plan: Patient no longer needs aspirin therapy (no history of CAD/clotting and CHADSVASC is 0). Stop aspirin. Continue to monitor.     BP/history of paroxysmal AF: Goal < 140/90. Clinic vitals reported above. CHADS2-VASc score = 0  Home BP ranges:100-110s/70s  Current meds include: metoprolol XL 50 daily  Plan: within goal. Continue to monitor    Anemia of CKD:  H/H:   Lab Results   Component Value Date    HGB 14.3 04/06/2020     Lab Results   Component Value Date    HCT 42.9 04/06/2020     Iron panel:  No results found for: IRON, TIBC, FERRITIN  No results found for: LABIRON    Prior ESA use: none post transplant  Plan: within goal. Continue to monitor.     DM:   Lab Results   Component Value Date    A1C 5.9 (H) 12/24/2019   . Goal A1c < 7  History of Dm? No  Established with endocrinologist/PCP for BG managment? No  Currently on: metformin 500 mg XR daily  Home BS log: checks once a day in the morning, blood sugars range 110-120s  Diet: did not address specifics but patient reports to eating well  Exercise: did not discuss  Fluid intake: ~2.5L daily  Plan:  Continue to monitor. Recheck A1C to determine if patient still needs metformin.    Electrolytes: wnl   Meds currently on: mg plus protein 133 mg BID and TUMS 1 tab in the morning  Plan: Stop TUMS. Continue to monitor.     GI/BM: pt reports no diarrhea or constipation  Meds currently on: omeprazole 20 mg daily  Plan: Continue to monitor    Pain: pt reports no pain  Meds currently on: APAP PRN, [place] to back PRN  Plan: Continue to monitor    Bone health:   Vitamin D Level: 37.7 in July 2020. Goal > 30.   Last DEXA results:  none available  Current meds include: calcitriol 0.5 mcg BID   Plan: patient had parathyroidectomy 11/2019. Continue to monitor.     Women's/Men's Health:  Louis Dennis is a 59 y.o. male. Patient reports no men's/women's health issues  Plan: Continue to monitor    Gout - no recent flares  Medications currently on: allopurinol 100 mg daily  Plan: continue to monitor    Hair thinning Meds currently on: biotin 5 mg daily  Plan: Continue to monitor    Adherence: Patient has good understanding of medications; was able to independently  identify names/doses of immunosuppressants and OI meds.  Patient does not fill their own pill box on a regular basis at home.His wife manages his medications   Patient brought medication card:yes  Pill box:did not bring   Plan: Encouraged patient to learn more about medications; provided basic adherence counseling/intervention    Patient was reviewed with Dr. Nestor Lewandowsky who was agreement with the stated plan:     During this visit, the following was completed:   BP log data assessment  Labs ordered and evaluated  complex treatment plan >1 DS   I spent a total of 20 minutes face to face with the patient delivering clinical care and providing education/counseling.     All questions/concerns were addressed to the patient's satisfaction.  __________________________________________  Carole Civil, PHARMD.  PGY2 Solid Organ Transplant Pharmacy Resident    Co-signed by Clarene Essex, CPP  Solid Organ Transplant Clinical Pharmacist  PAGER [phone]

## 2020-04-10 NOTE — Unmapped (Signed)
Transplant Nephrology Clinic Visit    History of Present Illness  [name] is a 59 y.o. 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  -Adequate perfusion of the transplant kidney. Resistive indices within the mid main renal artery and at the anastomosis are slightly elevated. Attention on follow-up.  ??  Current Immunosuppression Regimen:   - Myfortic 540mg  BID  - Envarsus 3mg  QD    Subjective/Interval:     Since patient's last visit in the transplant clinic - patient has been doing well in terms of transplant, taking transplant medications regularly, no episodes of rejection and no side effects of medications.    Today he presents feeling overall well with no complaints. Patient is currently taking biotin for his hair - he notes improvement. In regards to his diabetes, patient had questions about his diet. He has not received his COVID-19 vaccine, but plans on getting it soon.      Denies chest pain or tightness, fevers, chills, tremors, gout or difficulty voiding.     Takes his Envarsus at 7:30 am every morning.     Review of Systems  Otherwise as per HPI, all other systems reviewed and are negative.    Medications  Current Outpatient Medications   Medication Sig Dispense Refill   ??? allopurinoL (ZYLOPRIM) 100 MG tablet Take 1 tablet (100 mg total) by mouth daily. 30 tablet 11   ??? aspirin (ECOTRIN) 81 MG tablet Take 1 tablet (81 mg total) by mouth daily. 30 tablet 11   ??? biotin 5 mg cap Take 1 capsule (5,000 mcg total) by mouth daily.  0   ??? calcitrioL (ROCALTROL) 0.5 MCG capsule Take 1 capsule (0.5 mcg total) by mouth Two (2) times a day. 60 capsule 11   ??? magnesium oxide-Mg AA chelate (MAGNESIUM, AMINO ACID CHELATE,) 133 mg Tab Take 3 tablets by mouth Two (2) times a day. 180 tablet 5   ??? metFORMIN (GLUCOPHAGE-XR) 500 MG 24 hr tablet Take 1 tablet (500 mg total) by mouth daily with evening meal. 30 tablet 11   ??? metoprolol succinate (TOPROL XL) 25 MG 24 hr tablet Take 2 tablets (50 mg total) by mouth nightly. 60 tablet 11   ??? MYFORTIC 180 mg EC tablet Take 3 tablets (540 mg total) by mouth Two (2) times a day. 180 tablet 11   ??? omeprazole (PRILOSEC) 20 MG capsule Take 1 capsule (20 mg total) by mouth daily. 30 capsule 11   ??? tacrolimus (ENVARSUS XR) 1 mg Tb24 extended release tablet Take 3 tablets (3 mg total) by mouth daily. 90 tablet 11     No current facility-administered medications for this visit.  Physical Exam  BP 116/75 (BP Site: R Arm, BP Position: Sitting, BP Cuff Size: Medium)  - Pulse 70  - Temp 36.3 ??C (97.3 ??F) (Temporal)  - Wt 64.8 kg (142 lb 12.8 oz)  - BMI 24.51 kg/m??   General: Well appearing male, no acute distress  HEENT: wearing mask, EOMI, sclera anicteric;   Neck: wearing mask  CV: normal rate, normal rhythm, no murmur, no gallops, no rubs appreciated  Lungs: clear to auscultation bilaterally, normal work of breathing.  Abdomen: soft, non tender.  Extremities: no edema  Musculoskeletal: no visible deformity, normal range of motion.  Pulses: intact distally throughout  Neurologic: awake, alert, and oriented x3    Laboratory Data and Imaging reviewed in EPIC    Assessment: [name] is a 59 y.o. 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     Recommendations/Plan:   Allograft Function: renal function stable at 0.6 with a baseline of approximately 0.7-0.9 since transplant. Will continue to monitor.     Immunosuppression Management [High Risk Medical Decision Making For Drug Therapy Requiring Intensive Monitoring For Toxicity]:  Continue Envarsus 3 mg daily. Targeting tacrolimus trough level of approximately 7-9. Continue Myfortic 540 mg BID.    Blood Pressure Management: BP 116/75 at this visit. Continue Toprol-XL 50 mg at night. BP and heart rate have improved since the last dose increase. EKG done in 11/2019 was normal.     Infectious Prophylaxis and Monitoring:   Transitioned from Bactrim to Dapsone on 07/04/19 due to stable mild hyperkalemia.  No decoys present 09/03/19. Last CMV VL detected at <50 (04/06/20). Holding Valcyte.    Pre-diabetes: Currently on metformin 500 mg daily. Home BG have been intermittently elevated; however, last A1c was 5.9 in 12/2019.Will monitor.    Hypercalcemia: Persistent elevated but its better now.   - s/p parathyroid surgery - 11/11/2019    Lower Back Pain with midline radiculopathy: Known underlying degenerative changes. Resolved per Patient.     Atrial Fibrillation: Last EKG showed NSR.  Patient saw his cardiologist on 05/21/19 (care everywhere).  Echo on 05/08/19 showed EF 50-55%.      Health Maintenance:   - Colonoscopy due 2029  - Blood work once a month    Immunizations:  - Vaccination: PCV-23 completed 07/06/2015.   -Flu shot: 08/06/19  - Prevnar 13 to be given at 1 year post    Counseling:  I counseled the patient on the need to avoid sun exposure and the use of sunblock while outdoors given the relatively higher risk of skin malignancy in an immunosuppressed state and the need for adherence to immunosuppression medication.  Patient verbalized understanding.     Follow-Up:  Return to clinic in 2 months  Patient will continue to follow-up with his primary care provider for non-transplant related issues and medication refills. We have ordered transplant specific labs per the center's guidelines to monitor and assess for toxicities from immunosuppressant drug therapy    Scribe's Attestation: Leeroy Bock, MD obtained and performed the history, physical exam and medical decision making elements that were entered into the chart.  Signed by Sharion Balloon, Scribe, on Apr 10, 2020 at 9:40 AM.    Documentation assistance provided by the Scribe. I was present during the time the encounter was recorded. The information recorded by the Scribe was done at my direction and has been reviewed and validated by me.

## 2020-04-13 ENCOUNTER — Ambulatory Visit: Admit: 2020-04-13 | Discharge: 2020-04-14 | Payer: MEDICARE

## 2020-04-13 LAB — CBC W/ AUTO DIFF
BASOPHILS ABSOLUTE COUNT: 0 10*9/L (ref 0.0–0.1)
EOSINOPHILS RELATIVE PERCENT: 6.9 %
HEMATOCRIT: 41.6 % (ref 38.0–50.0)
HEMOGLOBIN: 14.1 g/dL (ref 13.5–17.5)
LYMPHOCYTES ABSOLUTE COUNT: 0.7 10*9/L (ref 0.7–4.0)
LYMPHOCYTES RELATIVE PERCENT: 9.7 %
MEAN CORPUSCULAR HEMOGLOBIN CONC: 33.9 g/dL (ref 30.0–36.0)
MEAN CORPUSCULAR HEMOGLOBIN: 30.2 pg (ref 26.0–34.0)
MEAN CORPUSCULAR VOLUME: 89.1 fL (ref 81.0–95.0)
MEAN PLATELET VOLUME: 9.1 fL (ref 7.0–10.0)
MONOCYTES ABSOLUTE COUNT: 0.4 10*9/L (ref 0.1–1.0)
MONOCYTES RELATIVE PERCENT: 6.2 %
NEUTROPHILS ABSOLUTE COUNT: 5.2 10*9/L (ref 1.7–7.7)
NEUTROPHILS RELATIVE PERCENT: 76.6 %
NUCLEATED RED BLOOD CELLS: 0 /100{WBCs} (ref <=4)
RED BLOOD CELL COUNT: 4.66 10*12/L (ref 4.32–5.72)
RED CELL DISTRIBUTION WIDTH: 15.4 % — ABNORMAL HIGH (ref 12.0–15.0)
WBC ADJUSTED: 6.8 10*9/L (ref 3.5–10.5)

## 2020-04-13 LAB — RED BLOOD CELL COUNT: Lab: 4.66

## 2020-04-13 LAB — BASIC METABOLIC PANEL
ANION GAP: 7 mmol/L (ref 3–11)
BUN / CREAT RATIO: 28
CALCIUM: 10.3 mg/dL (ref 8.7–10.4)
CHLORIDE: 106 mmol/L (ref 98–107)
CO2: 24.4 mmol/L (ref 20.0–31.0)
EGFR CKD-EPI AA MALE: >90 mL/min/{1.73_m2}
EGFR CKD-EPI NON-AA MALE: >90 mL/min/{1.73_m2}
POTASSIUM: 4.4 mmol/L (ref 3.5–5.1)
SODIUM: 137 mmol/L (ref 135–145)

## 2020-04-13 LAB — BLOOD UREA NITROGEN: Urea nitrogen:MCnc:Pt:Ser/Plas:Qn:: 19

## 2020-04-13 LAB — PHOSPHORUS: Phosphate:MCnc:Pt:Ser/Plas:Qn:: 3.2

## 2020-04-13 LAB — MAGNESIUM: Magnesium:MCnc:Pt:Ser/Plas:Qn:: 1.8

## 2020-04-14 LAB — TACROLIMUS, TROUGH: Lab: 11.7

## 2020-04-14 LAB — CMV DNA, QUANTITATIVE, PCR: CMV VIRAL LD: NOT DETECTED

## 2020-04-14 LAB — CMV VIRAL LD: Lab: NOT DETECTED

## 2020-04-14 NOTE — Unmapped (Signed)
Abstraction Result Flowsheet Data    This patient's last AWV date: [facility] Last Medicare Wellness Visit Date: 01/12/2018  This patients last WCC/CPE date: : Not Found      Reason for Encounter  Reason for Encounter: Outreach  Primary Reason for Call: AWV  Outreach Call Outcome: Scheduled (05/06/20 @8 :00am)  Text Message: No

## 2020-04-14 NOTE — Unmapped (Signed)
Tac level elevated above goal, spoke with pt he took his Envarsus before labs. Reminded him on lab draw days to take Envarsus after labs. Pt verbalized understanding.

## 2020-04-16 MED FILL — MYFORTIC 180 MG TABLET,DELAYED RELEASE: 30 days supply | Qty: 180 | Fill #2 | Status: AC

## 2020-04-16 MED FILL — MYFORTIC 180 MG TABLET,DELAYED RELEASE: ORAL | 30 days supply | Qty: 180 | Fill #2

## 2020-04-16 MED FILL — ENVARSUS XR 1 MG TABLET,EXTENDED RELEASE: 30 days supply | Qty: 90 | Fill #0 | Status: AC

## 2020-04-16 MED FILL — METOPROLOL SUCCINATE ER 25 MG TABLET,EXTENDED RELEASE 24 HR: ORAL | 30 days supply | Qty: 60 | Fill #4

## 2020-04-16 MED FILL — METOPROLOL SUCCINATE ER 25 MG TABLET,EXTENDED RELEASE 24 HR: 30 days supply | Qty: 60 | Fill #4 | Status: AC

## 2020-04-20 ENCOUNTER — Ambulatory Visit: Admit: 2020-04-20 | Discharge: 2020-04-21 | Payer: MEDICARE

## 2020-04-20 LAB — BASIC METABOLIC PANEL
ANION GAP: 7 mmol/L (ref 3–11)
BLOOD UREA NITROGEN: 19 mg/dL (ref 9–23)
BUN / CREAT RATIO: 28
CALCIUM: 10 mg/dL (ref 8.7–10.4)
CHLORIDE: 106 mmol/L (ref 98–107)
CREATININE: 0.67 mg/dL (ref 0.60–1.10)
EGFR CKD-EPI AA MALE: >90 mL/min/{1.73_m2}
EGFR CKD-EPI NON-AA MALE: >90 mL/min/{1.73_m2}
GLUCOSE RANDOM: 142 mg/dL (ref 70–179)
POTASSIUM: 4.6 mmol/L (ref 3.5–5.1)
SODIUM: 138 mmol/L (ref 135–145)

## 2020-04-20 LAB — MAGNESIUM: Magnesium:MCnc:Pt:Ser/Plas:Qn:: 1.7

## 2020-04-20 LAB — CBC W/ AUTO DIFF
BASOPHILS ABSOLUTE COUNT: 0.1 10*9/L (ref 0.0–0.1)
BASOPHILS RELATIVE PERCENT: 1 %
EOSINOPHILS ABSOLUTE COUNT: 0.5 10*9/L (ref 0.0–0.7)
EOSINOPHILS RELATIVE PERCENT: 8.7 %
HEMATOCRIT: 41.2 % (ref 38.0–50.0)
HEMOGLOBIN: 13.7 g/dL (ref 13.5–17.5)
LYMPHOCYTES ABSOLUTE COUNT: 0.5 10*9/L — ABNORMAL LOW (ref 0.7–4.0)
LYMPHOCYTES RELATIVE PERCENT: 9.6 %
MEAN CORPUSCULAR HEMOGLOBIN: 30.6 pg (ref 26.0–34.0)
MEAN CORPUSCULAR VOLUME: 92.1 fL (ref 81.0–95.0)
MEAN PLATELET VOLUME: 9.2 fL (ref 7.0–10.0)
MONOCYTES ABSOLUTE COUNT: 0.3 10*9/L (ref 0.1–1.0)
MONOCYTES RELATIVE PERCENT: 6.5 %
NEUTROPHILS ABSOLUTE COUNT: 3.9 10*9/L (ref 1.7–7.7)
NEUTROPHILS RELATIVE PERCENT: 74.2 %
NUCLEATED RED BLOOD CELLS: 0 /100{WBCs} (ref <=4)
PLATELET COUNT: 200 10*9/L (ref 150–450)
RED BLOOD CELL COUNT: 4.48 10*12/L (ref 4.32–5.72)

## 2020-04-20 LAB — TACROLIMUS, TROUGH: Lab: 16.8 — ABNORMAL HIGH

## 2020-04-20 LAB — PHOSPHORUS: Phosphate:MCnc:Pt:Ser/Plas:Qn:: 2.9

## 2020-04-20 LAB — EGFR CKD-EPI AA MALE: Glomerular filtration rate/1.73 sq M.predicted.black:ArVRat:Pt:Ser/Plas/Bld:Qn:Creatinine-based formula (CKD-EPI): >90

## 2020-04-20 LAB — LYMPHOCYTES RELATIVE PERCENT: Lymphocytes/100 leukocytes:NFr:Pt:Bld:Qn:Automated count: 9.6

## 2020-04-20 MED FILL — OMEPRAZOLE 20 MG CAPSULE,DELAYED RELEASE: ORAL | 30 days supply | Qty: 30 | Fill #2

## 2020-04-20 MED FILL — OMEPRAZOLE 20 MG CAPSULE,DELAYED RELEASE: 30 days supply | Qty: 30 | Fill #2 | Status: AC

## 2020-04-20 NOTE — Unmapped (Signed)
Tac level 16.8 today, called and spoke with patient using [employer] # [phone]. Spoke with pt, he states he did not take Envarsus prior to labs and is currently taking 3mg  daily. Pt also states he took his last dose at 7:30am the day before. Reviewed with Dr. Nestor Lewandowsky, pt will hold tomorrows dose and decrease Envarsus to 2mg  on Thursday 5/20. Updated prescription sent to [facility]. Pt made aware of change using [employer] # [phone], pt states he has not been taking any new medications or changes in diet. Pt did state he received his COVID-19 vaccine 2 weeks ago.

## 2020-04-21 LAB — CMV DNA, QUANTITATIVE, PCR

## 2020-04-21 LAB — CMV QUANT: Lab: 0

## 2020-04-21 MED ORDER — TACROLIMUS XR 1 MG TABLET,EXTENDED RELEASE 24 HR
ORAL_TABLET | Freq: Every day | ORAL | 11 refills | 30 days | Status: CP
Start: 2020-04-21 — End: 2021-04-21
  Filled 2020-05-14: qty 60, 30d supply, fill #0

## 2020-04-22 NOTE — Unmapped (Signed)
Change in Envarsus dosage decrease. Co-pay $0.00.

## 2020-04-24 ENCOUNTER — Ambulatory Visit: Admit: 2020-04-24 | Discharge: 2020-04-25 | Payer: MEDICARE

## 2020-04-24 LAB — BASIC METABOLIC PANEL
ANION GAP: 4 mmol/L (ref 3–11)
BLOOD UREA NITROGEN: 17 mg/dL (ref 9–23)
CALCIUM: 9.9 mg/dL (ref 8.7–10.4)
CHLORIDE: 109 mmol/L — ABNORMAL HIGH (ref 98–107)
CO2: 26.4 mmol/L (ref 20.0–31.0)
CREATININE: 0.65 mg/dL (ref 0.60–1.10)
EGFR CKD-EPI AA MALE: >90 mL/min/{1.73_m2}
EGFR CKD-EPI NON-AA MALE: >90 mL/min/{1.73_m2}
GLUCOSE RANDOM: 152 mg/dL (ref 70–179)
POTASSIUM: 4.4 mmol/L (ref 3.5–5.1)
SODIUM: 139 mmol/L (ref 135–145)

## 2020-04-24 LAB — CBC W/ AUTO DIFF
BASOPHILS ABSOLUTE COUNT: 0.1 10*9/L (ref 0.0–0.1)
BASOPHILS RELATIVE PERCENT: 0.9 %
EOSINOPHILS ABSOLUTE COUNT: 0.5 10*9/L (ref 0.0–0.7)
HEMATOCRIT: 41.9 % (ref 38.0–50.0)
HEMOGLOBIN: 14 g/dL (ref 13.5–17.5)
LYMPHOCYTES ABSOLUTE COUNT: 0.5 10*9/L — ABNORMAL LOW (ref 0.7–4.0)
LYMPHOCYTES RELATIVE PERCENT: 9.3 %
MEAN CORPUSCULAR HEMOGLOBIN CONC: 33.4 g/dL (ref 30.0–36.0)
MEAN CORPUSCULAR HEMOGLOBIN: 30.7 pg (ref 26.0–34.0)
MEAN CORPUSCULAR VOLUME: 91.9 fL (ref 81.0–95.0)
MEAN PLATELET VOLUME: 9 fL (ref 7.0–10.0)
MONOCYTES RELATIVE PERCENT: 8.5 %
NEUTROPHILS ABSOLUTE COUNT: 4.1 10*9/L (ref 1.7–7.7)
NEUTROPHILS RELATIVE PERCENT: 72.6 %
NUCLEATED RED BLOOD CELLS: 0 /100{WBCs} (ref <=4)
PLATELET COUNT: 205 10*9/L (ref 150–450)
RED CELL DISTRIBUTION WIDTH: 15.3 % — ABNORMAL HIGH (ref 12.0–15.0)
WBC ADJUSTED: 5.6 10*9/L (ref 3.5–10.5)

## 2020-04-24 LAB — CMV DNA, QUANTITATIVE, PCR: CMV QUANT: <50 [IU]/mL — ABNORMAL HIGH (ref <0)

## 2020-04-24 LAB — PHOSPHORUS: Phosphate:MCnc:Pt:Ser/Plas:Qn:: 2.8

## 2020-04-24 LAB — MAGNESIUM: Magnesium:MCnc:Pt:Ser/Plas:Qn:: 1.8

## 2020-04-24 LAB — CMV QUANT: Lab: <50 — ABNORMAL HIGH

## 2020-04-24 LAB — WBC ADJUSTED: Leukocytes:NCnc:Pt:Bld:Qn:: 5.6

## 2020-04-24 LAB — TACROLIMUS, TROUGH: Lab: 6.5

## 2020-04-24 LAB — POTASSIUM: Potassium:SCnc:Pt:Ser/Plas:Qn:: 4.4

## 2020-04-25 LAB — ESTIMATED AVERAGE GLUCOSE: Estimated average glucose:MCnc:Pt:Bld:Qn:Estimated from glycated hemoglobin: 137

## 2020-04-25 LAB — HEMOGLOBIN A1C: HEMOGLOBIN A1C: 6.4 % — ABNORMAL HIGH (ref 4.8–5.6)

## 2020-04-25 NOTE — Unmapped (Signed)
Assessment and Plan:     Louis Dennis was seen today for hypertension.    Diagnoses and all orders for this visit:    Renovascular hypertension  BP at goal (106/68 in clinic today). Continue metoprolol 50 mg HS. Reviewed low sodium diet and encouraged regular exercise. Advised to continue to monitor and log at-home BP readings.    Elevated blood sugar  Continue metformin 500 mg daily.   Nephrology is closely monitoring HGB A1c.     HPI:      Louis Dennis  is here for   Chief Complaint   Patient presents with   ??? Hypertension     f/u     Hypertension: Patient presents for follow-up of hypertension. Blood pressure goal < 140/90.  Hypertension has customarily been at goal.  Home blood pressure readings: did not bring log. Salt intake and diet: salt not added to cooking and salt shaker not on table. Associated signs and symptoms: none. Patient denies: blurred vision, chest pain, dyspnea, headache, neck aches, orthopnea, palpitations, paroxysmal nocturnal dyspnea, peripheral edema, pulsating in the ears and tiredness/fatigue. Medication compliance: taking as prescribed. He is not doing regular exercise.      Diabetes: Patient presents for follow up of diabetes.  A1C goal is <8.  Diabetes has customarily been at goal. Current symptoms include: none. Symptoms have been well-controlled. Patient denies foot ulcerations, hyperglycemia, hypoglycemia , increased appetite, nausea, paresthesia of the feet, polydipsia, polyuria, visual disturbances, vomiting and weight loss. Evaluation to date has included: fasting blood sugar and hemoglobin A1C.  Home sugars: 130-140s. Current treatment: Continued metformin  which has been effective.  Doing regular exercise: no. He has cut back on carbohydrate intake - limiting breads, rice.    Nephrology is closely monitoring A1c.       [facility] Components:     Goals     ??? Exercise at least 3x per week (30 min per time)      Pt walks about 4-5 days per week            Medication adherence and barriers to the treatment plan have been addressed. Opportunities to optimize healthy behaviors have been discussed. Patient / caregiver voiced understanding.      Past Medical/Surgical History:     Past Medical History:   Diagnosis Date   ??? Abnormal thyroid function test    ??? End stage renal disease (CMS-HCC)     now on peritoneal dialysis   ??? Gout     reports was in Right hand, foot and left elbow   ??? Hypertension     takes meds intermitttently based on readings   ??? Nephrolithiasis 2014     Past Surgical History:   Procedure Laterality Date   ??? OTHER SURGICAL HISTORY Right 08/2017    Pt had catheter moved from left side to right side.   ??? pd catheter  2012   ??? PR COLONOSCOPY W/BIOPSY SINGLE/MULTIPLE N/A 06/15/2018    Procedure: COLONOSCOPY, FLEXIBLE, PROXIMAL TO SPLENIC FLEXURE; WITH BIOPSY, SINGLE OR MULTIPLE;  Surgeon: Charm Rings, MD;  [facility];  Service: Gastroenterology   ??? PR COLSC FLX W/RMVL OF TUMOR POLYP LESION SNARE TQ N/A 06/15/2018    Procedure: COLONOSCOPY FLEX; W/REMOV TUMOR/LES BY SNARE;  Surgeon: Charm Rings, MD;  [facility];  Service: Gastroenterology   ??? PR EXPLORATORY OF ABDOMEN N/A 11/22/2018    Procedure: EXPLORATORY LAPAROTOMY, EXPLORATORY CELIOTOMY WITH OR WITHOUT BIOPSY(S);  Surgeon: Lawrence Marseilles Day  Thurnell Lose, MD;  Location: MAIN OR [facility];  Service: Trauma   ??? PR EXPLORE PARATHYROID GLANDS N/A 11/11/2019    Procedure: RS 22 PARATHYROIDECTOMY OR EXPLORATION OF PARATHYROID(S);  Surgeon: Johny Chess, MD;  Location: MAIN OR [facility];  Service: Surgical Oncology   ??? PR FREEING BOWEL ADHESION,ENTEROLYSIS N/A 11/22/2018    Procedure: Enterolysis (Separt Proc);  Surgeon: Lawrence Marseilles Day Thurnell Lose, MD;  Location: [facility];  Service: Trauma   ??? PR REMOVE PERITONEAL FOREIGN BODY N/A 11/22/2018    Procedure: Removal Of Peritoneal Of Foreign Body From Peritoneal Cavity;  Surgeon: Lawrence Marseilles Day Thurnell Lose, MD;  Location: [facility] OR [facility]; Service: Trauma   ??? PR TRANSPLANT,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    Procedure: [facility] STD PREP CAD DONR RENAL ALLOGFT PRIOR TO TRNSPLNT, INCL DISSEC/REM PERINEPH FAT, DIAPH/RTPER ATTAC;  Surgeon: Doyce Loose, MD;  Location: MAIN OR [facility];  Service: Transplant   ??? PR TRANSPLANTATION OF KIDNEY N/A 05/24/2019    Procedure: RENAL ALLOTRANSPLANTATION, IMPLANTATION OF GRAFT; WITHOUT RECIPIENT NEPHRECTOMY;  Surgeon: Doyce Loose, MD;  Location: [facility] OR [facility];  Service: Transplant       Family History:     Family History   Problem Relation Age of Onset   ??? Hypertension Mother    ??? Kidney disease Mother    ??? Diabetes Mother    ??? Hypertension Father    ??? Kidney disease Sister         s/p transplant   ??? Hypertension Brother    ??? Glaucoma Neg Hx    ??? Amblyopia Neg Hx    ??? Blindness Neg Hx    ??? Retinal detachment Neg Hx    ??? Strabismus Neg Hx    ??? Macular degeneration Neg Hx    ??? Basal cell carcinoma Neg Hx    ??? Melanoma Neg Hx    ??? Squamous cell carcinoma Neg Hx        Social History:     Social History     Socioeconomic History   ??? Marital status: Married     Spouse name: None   ??? Number of children: 2   ??? Years of education: 19   ??? Highest education level: None   Occupational History   ??? None   Tobacco Use   ??? Smoking status: Former Smoker     Packs/day: 1.00     Years: 30.00     Pack years: 30.00     Types: Cigarettes     Quit date: 07/10/2009     Years since quitting: 10.8   ??? Smokeless tobacco: Never Used   Vaping Use   ??? Vaping Use: Never used   Substance and Sexual Activity   ??? Alcohol use: No     Alcohol/week: 0.0 standard drinks   ??? Drug use: No   ??? Sexual activity: None   Other Topics Concern   ??? Do you use sunscreen? Yes   ??? Tanning bed use? No   ??? Are you easily burned? No   ??? Excessive sun exposure? No   ??? Blistering sunburns? No   Social History Narrative   ??? None     Social Determinants of Health     Financial Resource Strain: Low Risk    ??? Difficulty of Paying Living Expenses: Not hard at all   Food Insecurity: Food Insecurity Present   ??? Worried About Programme researcher, broadcasting/film/video in the Last Year: Sometimes true   ??? Ran Out  of Food in the Last Year: Sometimes true   Transportation Needs: No Transportation Needs   ??? Lack of Transportation (Medical): No   ??? Lack of Transportation (Non-Medical): No   Physical Activity: Sufficiently Active   ??? Days of Exercise per Week: 7 days   ??? Minutes of Exercise per Session: 90 min   Stress: No Stress Concern Present   ??? Feeling of Stress : Only a little   Social Connections: Socially Integrated   ??? Frequency of Communication with Friends and Family: More than three times a week   ??? Frequency of Social Gatherings with Friends and Family: More than three times a week   ??? Attends Religious Services: More than 4 times per year   ??? Active Member of Clubs or Organizations: Yes   ??? Attends Banker Meetings: More than 4 times per year   ??? Marital Status: Married       Allergies:     Patient has no known allergies.    Current Medications:     Current Outpatient Medications   Medication Sig Dispense Refill   ??? allopurinoL (ZYLOPRIM) 100 MG tablet Take 1 tablet (100 mg total) by mouth daily. 30 tablet 11   ??? biotin 5 mg cap Take 1 capsule (5,000 mcg total) by mouth daily.  0   ??? magnesium oxide-Mg AA chelate (MAGNESIUM, AMINO ACID CHELATE,) 133 mg Tab Take 1 tablet by mouth Two (2) times a day. 60 tablet 11   ??? metFORMIN (GLUCOPHAGE-XR) 500 MG 24 hr tablet Take 1 tablet (500 mg total) by mouth daily with evening meal. 30 tablet 11   ??? metoprolol succinate (TOPROL XL) 25 MG 24 hr tablet Take 2 tablets (50 mg total) by mouth nightly. 60 tablet 11   ??? MYFORTIC 180 mg EC tablet Take 3 tablets (540 mg total) by mouth Two (2) times a day. 180 tablet 11   ??? omeprazole (PRILOSEC) 20 MG capsule Take 1 capsule (20 mg total) by mouth daily. 30 capsule 11   ??? tacrolimus (ENVARSUS XR) 1 mg Tb24 extended release tablet Take 2 tablets (2 mg total) by mouth daily. 60 tablet 11 No current facility-administered medications for this visit.       Health Maintenance:     Health Maintenance Summary w/Most Recent Date       Status Date      Zoster Vaccines Overdue 06/19/2010     COVID-19 Vaccine Next Due 05/10/2020      Done 04/12/2020 Imm Admin: COVID-19 VACCINE,MRNA(MODERNA)(PF)(IM)    Lipid Screening Next Due 09/01/2024      Done 09/02/2019 LIPID PANEL     Done 09/02/2019 SmartData: [facility] TOTAL CHOLESTEROL AND HDL COMPLETE     Done 03/01/2019 LIPID PANEL     Done 01/25/2018 LIPID PANEL     Done 03/15/2016 LIPID PANEL     Patient has more history with this topic...    Colonoscopy Next Due 06/15/2028      Done 06/15/2018 Surg:PR COLONOSCOPY W/BIOPSY SINGLE/MULTIPLE     Done 06/15/2018 Surg:PR COLSC FLX W/RMVL OF TUMOR POLYP LESION SNARE TQ     Done 06/15/2018 COLONOSCOPY     Done 02/26/2013 COLONOSCOPY (HISTORICAL RESULT)    DTaP/Tdap/Td Vaccines Next Due 12/30/2029      Done 12/31/2019 Imm Admin: TdaP    Influenza Vaccine This plan is no longer active.      Done 08/06/2019 Imm Admin: Influenza Vaccine Quad (IIV4 PF) 31mo+ injectable     Done 10/02/2018 Imm  Admin: Influenza Virus Vaccine, unspecified formulation     Done 09/04/2018 Imm Admin: Influenza Virus Vaccine, unspecified formulation     Done 09/18/2017 Imm Admin: Influenza Virus Vaccine, unspecified formulation     Done 09/11/2017 Imm Admin: Influenza Vaccine Quad (IIV4 PF) 21mo+ injectable     Patient has more history with this topic...    Hepatitis C Screen This plan is no longer active.      Done 10/21/2019 HEPATITIS C ANTIBODY Hepatitis C Ab           Done 05/23/2019 HEPATITIS C ANTIBODY Hepatitis C Ab           Done 04/02/2019 HEPATITIS C ANTIBODY Hepatitis C Ab           Done 03/01/2019 HEPATITIS C ANTIBODY Hepatitis C Ab           Done 11/26/2018 HEPATITIS C ANTIBODY Hepatitis C Ab           Patient has more history with this topic...          Immunizations:     Immunization History   Administered Date(s) Administered   ??? COVID-19 VACCINE,MRNA(MODERNA)(PF)(IM) 04/12/2020   ??? INFLUENZA INJ MDCK PF, Quad (Flucelvax)(4y and up Egg Free) 09/04/2017   ??? INFLUENZA TIV (TRI) PF (IM) 08/17/2011, 08/08/2012, 08/05/2013, 08/28/2014, 08/11/2015, 08/29/2015   ??? Influenza Vaccine Quad (IIV4 PF) 11mo+ injectable 08/16/2015, 09/06/2016, 09/11/2017, 08/06/2019   ??? Influenza Virus Vaccine, unspecified formulation 08/30/2016, 09/18/2017, 09/04/2018, 10/02/2018   ??? PNEUMOCOCCAL POLYSACCHARIDE 23 07/06/2015   ??? PPD Test 09/08/2011, 09/07/2012, 09/05/2013, 09/09/2014, 09/07/2015, 08/10/2016, 09/11/2017, 09/05/2018, 01/03/2019   ??? Pneumococcal Conjugate 13-Valent 07/24/2010, 07/15/2015   ??? TdaP 12/31/2019       I have reviewed and (if needed) updated the patient's problem list, medications, allergies, past medical and surgical history, social and family history.    ROS:      ROS  Comprehensive 10 point ROS negative unless otherwise stated in the HPI.       Vital Signs:     Wt Readings from Last 3 Encounters:   04/29/20 64 kg (141 lb)   04/10/20 64.8 kg (142 lb 12.8 oz)   02/05/20 65.2 kg (143 lb 12.8 oz)     Temp Readings from Last 3 Encounters:   04/29/20 36.7 ??C (98 ??F) (Oral)   04/10/20 36.3 ??C (97.3 ??F) (Temporal)   04/10/20 36.3 ??C (97.3 ??F) (Temporal)     BP Readings from Last 3 Encounters:   04/29/20 106/68   04/10/20 116/75   04/10/20 116/75     Pulse Readings from Last 3 Encounters:   04/29/20 74   04/10/20 70   04/10/20 70     Estimated body mass index is 24.19 kg/m?? as calculated from the following:    Height as of this encounter: 162.6 cm (5' 4.02).    Weight as of this encounter: 64 kg (141 lb).  Facility age limit for growth percentiles is 20 years.        Objective:      General: Alert and oriented x3. Well-appearing. No acute distress.   HEENT:  Normocephalic.  Atraumatic. Conjunctiva and sclera normal. OP MMM without lesions.   Neck:  Supple. No thyroid enlargement. No adenopathy.   Heart:  Regular rate and rhythm . Normal S1, S2.  No murmurs, rubs or gallops.   Lungs:  No respiratory distress.  Lungs clear to auscultation. No wheezes, rhonchi, or rales.   GI/GU:  Soft, +BS, nondistended, non-TTP.  Extremities:  No edema. Peripheral pulses normal.   Skin:  Warm, dry. No rash or lesions present.   Neuro:  Non-focal. No obvious weakness.  Psych:  Affect normal, eye contact good, speech clear and coherent.      I attest that I, Bayard Hugger, personally documented this note while acting as scribe for Noralyn Pick, FNP.      Bayard Hugger, Scribe.  04/29/2020     The documentation recorded by the scribe accurately reflects the service I personally performed and the decisions made by me.    Noralyn Pick, FNP

## 2020-04-29 ENCOUNTER — Ambulatory Visit: Admit: 2020-04-29 | Discharge: 2020-04-30 | Payer: MEDICARE | Attending: Family | Primary: Family

## 2020-04-29 DIAGNOSIS — R739 Hyperglycemia, unspecified: Principal | ICD-10-CM

## 2020-04-29 DIAGNOSIS — I150 Renovascular hypertension: Principal | ICD-10-CM

## 2020-05-01 NOTE — Unmapped (Signed)
This auto-generated note displays abnormal results identified during the AWV Assessments. For full results, please see the Flowsheet Links under the Additional Documentation section of this encounter in Chart Review.      Risks Identified:  The following risks were identified and addressed this visit. Refer to progress note below for specifics on risks identified and interventions provided.   Pain - reports occasional pain in knees. Pain relieved by pain relieving cream.      Social Determinants of Health:  Social Determinants of Health Screened today. Interventions Provided: N/A; No issues identified.    PCP notified of above risks by  n/a    The following list of current providers and suppliers reviewed and updated this visit.  Patient Care Team:  Loran Senters, FNP as PCP - General (Family Medicine)  Keri Rosita Fire, FNP as PCP - Rande Brunt  Lindi Adie, RN as Transplant Coordinator (Transplant Nephrology)  Leeroy Bock, MD as Transplant Nephrologist (Nephrology)  Adrian Prince Toya Smothers, RN as Case Manager  Suzzette Righter, MD PhD (Ophthalmology)  Joretta Bachelor, MD (Otolaryngology)    Medications and supplements were reviewed and updated this visit. See medication list in encounter summary.     Recent Hospitalizations reviewed:  No recent hospitalizations     General Health:  Pain identified during today's visit        05/06/20 0814   PainSc: 0-No pain       Safety:  Patient answered a home safety assessment abnormally:  Working smoke alarms (!) No (plans to call fire department to have installed)   Rugs removed/fastened Yes   Non-slip mats installed Yes         Barriers to Care From History:  Caregiver burden No   Cognitive Impairment No   Falls Risk No   Financial difficulty No   Frail Elderly No   Hearing impairment/loss No   Homeless No   Impaired mobility No   Inadequate social/family support No   Ineffective family coping No   Low Literacy No   Nonadherence to medication No   Non-english speaking No   Terminal Illness/Hospice No   Transportation barriers No   Visual impairment Yes     Here is your personalized prevention plan based on your Annual Wellness Visit today.    Medicare Screening & Prevention Guidelines Recommendations Last Date Completed HM Status and Next Due Follow-Up   AAA Ultrasound Once in men age 25 to 39 who have ever smoked or currently smoke. AAA screening date: Not Found Health Maintenance Summary    Patient has no health maintenance due at this time      Not within age range   Colorectal Cancer Screening Patients 50 to 75: stool cards annually OR colonoscopy every 10 years (or more frequently if high risk) OR FIT-DNA every 3 years.  Colonoscopy date: 06/15/2018  FOBT/FIT date: Not Found  Sigmoidoscopy date: Not Found  FIT-DNA date: Not Found Health Maintenance Summary       Status Date      Colonoscopy Next Due 06/15/2028      Done 06/15/2018 Surg:PR COLONOSCOPY W/BIOPSY SINGLE/MULTIPLE     Patient has more history with this topic...       Up to date - next due 06/15/2028   DEXA Bone Density Measurement Patients age 82-85 to have a DEXA every 5 years in postmenopausal women, males will defer to PCP. DEXA date: Not Found Health Maintenance Summary    Patient has no health maintenance due at this  time      Defer to PCP   Heart Disease Screening (fasting lipid panel) Minimum of every 5 years, patients age 35-75,  if no apparent signs or symptoms of heart disease. LDL date: 09/02/2019  Total choleseterol date: 09/02/2019  HDL date: 09/02/2019  Triglycerides date: 09/02/2019 Health Maintenance Summary       Status Date      Lipid Screening Next Due 09/01/2024      Done 09/02/2019 LIPID PANEL     Patient has more history with this topic...       Up to date   Hepatitis C Screening A one-time screening for HCV infection for adults age 45 to 60 years old. HCV screening date: 10/21/2019 Health Maintenance Summary       Status Date      Hepatitis C Screen This plan is no longer active. Done 10/21/2019 HEPATITIS C ANTIBODY Hepatitis C Ab           Patient has more history with this topic...       Complete   Tdap Every 10 years (will not be covered by Medicare) DTap/Tdap/TD vaccination: 12/31/2019 Health Maintenance Summary       Status Date      DTaP/Tdap/Td Vaccines Next Due 12/30/2029      Done 12/31/2019 Imm Admin: TdaP     Up to date - next due 12/30/2029   Influenza Vaccine Annually  Influenza Vaccination: 08/06/2019   Health Maintenance Summary       Status Date      Influenza Vaccine This plan is no longer active.      Done 08/06/2019 Imm Admin: Influenza Vaccine Quad (IIV4 PF) 25mo+   injectable     Patient has more history with this topic...       Up to date - next due around 08/2020   Prevnar and Pneumovax Vaccines Prevnar given at age 32 and Pneumovax given one year later. These vaccines may be given in a different sequence depending on chronic conditions. (utilize BPA for dosing & administration) Pneumonia vaccination: 07/15/2015   Health Maintenance Summary    Patient has no health maintenance due at this time      Not within age range   Zoster Vaccine Healthy adults 50 years and older receive 2 doses of recombinant zoster vaccine two to six months apart (may not be covered by Medicare).  Zoster vaccination: Not Found Health Maintenance Summary       Status Date      Zoster Vaccines Overdue 06/19/2010      Provided information on how to obtain at pharmacy (shingrix)   Diabetes Screening  Annually for patients 40-70, if risk factors (family hx of DM, hx of gestational DM, and/or PCOS), twice per year if diagnosed with pre-diabetes.    Range 65-99 Diabetes screening date: 04/24/2020 04/24/20: A1C 6.4 Up to date

## 2020-05-06 ENCOUNTER — Ambulatory Visit: Admit: 2020-05-06 | Discharge: 2020-05-07 | Payer: MEDICARE

## 2020-05-06 DIAGNOSIS — Z0000 Encounter for general adult medical examination without abnormal findings: Principal | ICD-10-CM

## 2020-05-06 NOTE — Unmapped (Signed)
ADVANCE CARE PLANNING NOTE    Discussion Date:  May 06, 2020    Patient has decisional capacity:  Yes    Patient has selected a Health Care Decision-Maker if loses capacity: Yes    Health Care Decision Maker as of 05/06/2020    HCDM (patient stated preference): [place] - Spouse - [phone]    HCDM, First Alternate: [place], [place] - [phone]    Discussion Participants:  Patient, CM    Communication of Medical Status/Prognosis:   Patient states health is good.     Communication of Treatment Goals/Options:   Patient declined ACP education/discussion today. HCDM and first alternate confirmed.     Treatment Decisions:   05/06/2020    Noralyn Pick, FNP was present and immediately available in office suite.    The patient reports they decline information about Healthcare power of attorney Living Will today..                I spent between 1-15 minutes providing voluntary advance care planning services for this patient.

## 2020-05-06 NOTE — Unmapped (Addendum)
Thank you for completing your Medicare Annual Wellness Visit today. Please see below educational materials on health topics specific to you as well as resources discussed during today's call. If you have any questions or concerns, please reach out to our care manager, Beatriz Stallion, RN CM, via myChart or at [phone].     Here is your personalized prevention plan based on your Annual Wellness Visit today.    Medicare Screening & Prevention Guidelines Recommendations Last Date Completed HM Status and Next Due Follow-Up   AAA Ultrasound Once in men age 18 to 80 who have ever smoked or currently smoke. AAA screening date: Not Found Health Maintenance Summary    Patient has no health maintenance due at this time      Not within age range   Colorectal Cancer Screening Patients 50 to 75: stool cards annually OR colonoscopy every 10 years (or more frequently if high risk) OR FIT-DNA every 3 years.  Colonoscopy date: 06/15/2018  FOBT/FIT date: Not Found  Sigmoidoscopy date: Not Found  FIT-DNA date: Not Found Health Maintenance Summary       Status Date      Colonoscopy Next Due 06/15/2028      Done 06/15/2018 Surg:PR COLONOSCOPY W/BIOPSY SINGLE/MULTIPLE     Patient has more history with this topic...       Up to date - next due 06/15/2028   DEXA Bone Density Measurement Patients age 1-85 to have a DEXA every 5 years in postmenopausal women, males will defer to PCP. DEXA date: Not Found Health Maintenance Summary    Patient has no health maintenance due at this time      Defer to PCP   Heart Disease Screening (fasting lipid panel) Minimum of every 5 years, patients age 83-75,  if no apparent signs or symptoms of heart disease. LDL date: 09/02/2019  Total choleseterol date: 09/02/2019  HDL date: 09/02/2019  Triglycerides date: 09/02/2019 Health Maintenance Summary       Status Date      Lipid Screening Next Due 09/01/2024      Done 09/02/2019 LIPID PANEL     Patient has more history with this topic...       Up to date Hepatitis C Screening A one-time screening for HCV infection for adults age 53 to 60 years old. HCV screening date: 10/21/2019 Health Maintenance Summary       Status Date      Hepatitis C Screen This plan is no longer active.      Done 10/21/2019 HEPATITIS C ANTIBODY Hepatitis C Ab           Patient has more history with this topic...       Complete   Tdap Every 10 years (will not be covered by Medicare) DTap/Tdap/TD vaccination: 12/31/2019 Health Maintenance Summary       Status Date      DTaP/Tdap/Td Vaccines Next Due 12/30/2029      Done 12/31/2019 Imm Admin: TdaP     Up to date - next due 12/30/2029   Influenza Vaccine Annually  Influenza Vaccination: 08/06/2019   Health Maintenance Summary       Status Date      Influenza Vaccine This plan is no longer active.      Done 08/06/2019 Imm Admin: Influenza Vaccine Quad (IIV4 PF) 75mo+   injectable     Patient has more history with this topic...       Up to date - next due around 08/2020   Prevnar  and Pneumovax Vaccines Prevnar given at age 68 and Pneumovax given one year later. These vaccines may be given in a different sequence depending on chronic conditions. (utilize BPA for dosing & administration) Pneumonia vaccination: 07/15/2015   Health Maintenance Summary    Patient has no health maintenance due at this time      Not within age range   Zoster Vaccine Healthy adults 50 years and older receive 2 doses of recombinant zoster vaccine two to six months apart (may not be covered by Medicare).  Zoster vaccination: Not Found Health Maintenance Summary       Status Date      Zoster Vaccines Overdue 06/19/2010      Provided information on how to obtain at pharmacy (shingrix)   Diabetes Screening  Annually for patients 40-70, if risk factors (family hx of DM, hx of gestational DM, and/or PCOS), twice per year if diagnosed with pre-diabetes.    Range 65-99 Diabetes screening date: 04/24/2020 04/24/20: A1C 6.4 Up to date      Patient Education        Well Visit, Men 50 to 64: Care Instructions  Overview     Well visits can help you stay healthy. Your doctor has checked your overall health and may have suggested ways to take good care of yourself. Your doctor also may have recommended tests. At home, you can help prevent illness with healthy eating, regular exercise, and other steps.  Follow-up care is a key part of your treatment and safety. Be sure to make and go to all appointments, and call your doctor if you are having problems. It's also a good idea to know your test results and keep a list of the medicines you take.  How can you care for yourself at home?  ?? Get screening tests that you and your doctor decide on. Screening helps find diseases before any symptoms appear.  ?? Eat healthy foods. Choose fruits, vegetables, whole grains, protein, and low-fat dairy foods. Limit fat, especially saturated fat. Reduce salt in your diet.  ?? Limit alcohol. Have no more than 2 drinks a day or 14 drinks a week.  ?? Get at least 30 minutes of exercise on most days of the week. Walking is a good choice. You also may want to do other activities, such as running, swimming, cycling, or playing tennis or team sports.  ?? Reach and stay at a healthy weight. This will lower your risk for many problems, such as obesity, diabetes, heart disease, and high blood pressure.  ?? Do not smoke. Smoking can make health problems worse. If you need help quitting, talk to your doctor about stop-smoking programs and medicines. These can increase your chances of quitting for good.  ?? Care for your mental health. It is easy to get weighed down by worry and stress. Learn strategies to manage stress, like deep breathing and mindfulness, and stay connected with your family and community. If you find you often feel sad or hopeless, talk with your doctor. Treatment can help.  ?? Talk to your doctor about whether you have any risk factors for sexually transmitted infections (STIs). You can help prevent STIs if you wait to have sex with a new partner (or partners) until you've each been tested for STIs. It also helps if you use condoms (male or male condoms) and if you limit your sex partners to one person who only has sex with you. Vaccines are available for some STIs.  ?? If it's important  to you to prevent pregnancy with your partner, talk with your doctor about birth control options that might be best for you.  ?? If you think you may have a problem with alcohol or drug use, talk to your doctor. This includes prescription medicines (such as amphetamines and opioids) and illegal drugs (such as cocaine and methamphetamine). Your doctor can help you figure out what type of treatment is best for you.  ?? Protect your skin from too much sun. When you're outdoors from 10 a.m. to 4 p.m., stay in the shade or cover up with clothing and a hat with a wide brim. Wear sunglasses that block UV rays. Even when it's cloudy, put broad-spectrum sunscreen (SPF 30 or higher) on any exposed skin.  ?? See a dentist one or two times a year for checkups and to have your teeth cleaned.  ?? Wear a seat belt in the car.  When should you call for help?  Watch closely for changes in your health, and be sure to contact your doctor if you have any problems or symptoms that concern you.  Where can you learn more?  Go to [facility] at [email or web address]  Select Patient Education under [employer]. Enter [phone] in the search box to learn more about Well Visit, Men 50 to 65: Care Instructions.  Current as of: May 01, 2019??????????????????????????????Content Version: 12.8  ?? 2006-2021 Healthwise, Inc[employer].   Care instructions adapted under license by [facility]. If you have questions about a medical condition or this instruction, always ask your healthcare professional. Healthwise, Incorporated disclaims any warranty or liability for your use of this information.

## 2020-05-07 NOTE — Unmapped (Signed)
left message using interpreter services, called to schedule a follow up appt with Dr. Nestor Lewandowsky. Can be scheduled from the recall tab 6/3 EW,

## 2020-05-08 ENCOUNTER — Ambulatory Visit: Admit: 2020-05-08 | Discharge: 2020-05-08 | Payer: MEDICARE

## 2020-05-08 NOTE — Unmapped (Signed)
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     Was a Nurse, learning disability used for this call? No    Completed refill call assessment today to schedule patient's medication shipment from the [facility] [phone]).       Specialty medication(s) and dose(s) confirmed: Patient reports changes to the regimen as follows: envarsus 1mg  decreased  to (2) tablets daily   Changes to medications: Louis Dennis reports no changes at this time.  Changes to insurance: No  Questions for the pharmacist: No    Confirmed patient received Welcome Packet with first shipment. The patient will receive a drug information handout for each medication shipped and additional FDA Medication Guides as required.       DISEASE/MEDICATION-SPECIFIC INFORMATION        N/A    SPECIALTY MEDICATION ADHERENCE     Medication Adherence    Patient reported X missed doses in the last month: 0  Specialty Medication: envarsus 1mg   Patient is on additional specialty medications: Yes  Additional Specialty Medications: Myfortic 180mg   Patient Reported Additional Medication X Missed Doses in the Last Month: 0  Patient is on more than two specialty medications: No  Informant: patient  Reliability of informant: reliable  Patient is at risk for Non-Adherence: No                envrsus 1 mg: 14 days of medicine on hand   Myfortic 180 mg: 10 days of medicine on hand         SHIPPING     Shipping address confirmed in Epic.     Delivery Scheduled: Yes, Expected medication delivery date: 06/09.     Medication will be delivered via Next Day Courier to the prescription address in Epic WAM.    Antonietta Barcelona   [facility] Specialty Technician

## 2020-05-14 MED FILL — ALLOPURINOL 100 MG TABLET: ORAL | 30 days supply | Qty: 30 | Fill #10

## 2020-05-14 MED FILL — MYFORTIC 180 MG TABLET,DELAYED RELEASE: ORAL | 30 days supply | Qty: 180 | Fill #3

## 2020-05-14 MED FILL — ALLOPURINOL 100 MG TABLET: 30 days supply | Qty: 30 | Fill #10 | Status: AC

## 2020-05-14 MED FILL — METOPROLOL SUCCINATE ER 25 MG TABLET,EXTENDED RELEASE 24 HR: ORAL | 30 days supply | Qty: 60 | Fill #5

## 2020-05-14 MED FILL — OMEPRAZOLE 20 MG CAPSULE,DELAYED RELEASE: ORAL | 30 days supply | Qty: 30 | Fill #3

## 2020-05-14 MED FILL — METOPROLOL SUCCINATE ER 25 MG TABLET,EXTENDED RELEASE 24 HR: 30 days supply | Qty: 60 | Fill #5 | Status: AC

## 2020-05-14 MED FILL — ENVARSUS XR 1 MG TABLET,EXTENDED RELEASE: 30 days supply | Qty: 60 | Fill #0 | Status: AC

## 2020-05-14 MED FILL — OMEPRAZOLE 20 MG CAPSULE,DELAYED RELEASE: 30 days supply | Qty: 30 | Fill #3 | Status: AC

## 2020-05-14 MED FILL — MYFORTIC 180 MG TABLET,DELAYED RELEASE: 30 days supply | Qty: 180 | Fill #3 | Status: AC

## 2020-05-21 ENCOUNTER — Ambulatory Visit: Admit: 2020-05-21 | Discharge: 2020-05-22 | Payer: MEDICARE

## 2020-05-21 LAB — CBC W/ AUTO DIFF
BASOPHILS ABSOLUTE COUNT: 0.1 10*9/L (ref 0.0–0.1)
BASOPHILS RELATIVE PERCENT: 1.2 %
EOSINOPHILS ABSOLUTE COUNT: 0.6 10*9/L (ref 0.0–0.7)
EOSINOPHILS RELATIVE PERCENT: 11.1 %
HEMATOCRIT: 42.8 % (ref 38.0–50.0)
HEMOGLOBIN: 14.2 g/dL (ref 13.5–17.5)
LYMPHOCYTES ABSOLUTE COUNT: 0.5 10*9/L — ABNORMAL LOW (ref 0.7–4.0)
LYMPHOCYTES RELATIVE PERCENT: 10 %
MEAN CORPUSCULAR HEMOGLOBIN CONC: 33.2 g/dL (ref 30.0–36.0)
MEAN CORPUSCULAR VOLUME: 91.9 fL (ref 81.0–95.0)
MEAN PLATELET VOLUME: 8.9 fL (ref 7.0–10.0)
MONOCYTES ABSOLUTE COUNT: 0.4 10*9/L (ref 0.1–1.0)
MONOCYTES RELATIVE PERCENT: 7 %
NEUTROPHILS ABSOLUTE COUNT: 3.9 10*9/L (ref 1.7–7.7)
NEUTROPHILS RELATIVE PERCENT: 70.7 %
NUCLEATED RED BLOOD CELLS: 0 /100{WBCs} (ref <=4)
PLATELET COUNT: 201 10*9/L (ref 150–450)
RED BLOOD CELL COUNT: 4.66 10*12/L (ref 4.32–5.72)
RED CELL DISTRIBUTION WIDTH: 15.6 % — ABNORMAL HIGH (ref 12.0–15.0)

## 2020-05-21 LAB — BASIC METABOLIC PANEL
ANION GAP: 6 mmol/L (ref 3–11)
BLOOD UREA NITROGEN: 18 mg/dL (ref 9–23)
BUN / CREAT RATIO: 30
CHLORIDE: 108 mmol/L — ABNORMAL HIGH (ref 98–107)
CREATININE: 0.61 mg/dL (ref 0.60–1.10)
EGFR CKD-EPI AA MALE: >90 mL/min/{1.73_m2}
GLUCOSE RANDOM: 139 mg/dL (ref 70–179)
POTASSIUM: 4.5 mmol/L (ref 3.5–5.1)
SODIUM: 140 mmol/L (ref 135–145)

## 2020-05-21 LAB — TACROLIMUS, TROUGH: Lab: 6.8

## 2020-05-21 LAB — PHOSPHORUS: Phosphate:MCnc:Pt:Ser/Plas:Qn:: 2.9

## 2020-05-21 LAB — ANION GAP: Anion gap 3:SCnc:Pt:Ser/Plas:Qn:: 6

## 2020-05-21 LAB — MAGNESIUM: Magnesium:MCnc:Pt:Ser/Plas:Qn:: 1.8

## 2020-05-21 LAB — NEUTROPHILS RELATIVE PERCENT: Neutrophils/100 leukocytes:NFr:Pt:Bld:Qn:Automated count: 70.7

## 2020-05-22 LAB — CMV DNA, QUANTITATIVE, PCR: CMV VIRAL LD: DETECTED — AB

## 2020-05-22 LAB — CMV VIRAL LD: Lab: DETECTED — AB

## 2020-06-04 NOTE — Unmapped (Signed)
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     Was a Nurse, learning disability used for this call? No    Completed refill call assessment today to schedule patient's medication shipment from the [facility] [phone]).       Specialty medication(s) and dose(s) confirmed: Regimen is correct and unchanged.   Changes to medications: [name] reports no changes at this time.  Changes to insurance: No  Questions for the pharmacist: No    Confirmed patient received Welcome Packet with first shipment. The patient will receive a drug information handout for each medication shipped and additional FDA Medication Guides as required.       DISEASE/MEDICATION-SPECIFIC INFORMATION        N/A    SPECIALTY MEDICATION ADHERENCE     Medication Adherence    Patient reported X missed doses in the last month: 0        Envarsus 1mg : 10 days worth of medication on hand.  Myfortic 180mg : 10 days worth of medication on hand.            SHIPPING     Shipping address confirmed in Epic.     Delivery Scheduled: Yes, Expected medication delivery date: 06/11/20.     Medication will be delivered via Next Day Courier to the prescription address in Epic WAM.    Louis Dennis   [facility] Specialty Technician

## 2020-06-10 MED FILL — MYFORTIC 180 MG TABLET,DELAYED RELEASE: ORAL | 30 days supply | Qty: 180 | Fill #4

## 2020-06-10 MED FILL — ENVARSUS XR 1 MG TABLET,EXTENDED RELEASE: ORAL | 30 days supply | Qty: 60 | Fill #1

## 2020-06-10 MED FILL — ALLOPURINOL 100 MG TABLET: ORAL | 30 days supply | Qty: 30 | Fill #11

## 2020-06-10 MED FILL — MYFORTIC 180 MG TABLET,DELAYED RELEASE: 30 days supply | Qty: 180 | Fill #4 | Status: AC

## 2020-06-10 MED FILL — METOPROLOL SUCCINATE ER 25 MG TABLET,EXTENDED RELEASE 24 HR: 30 days supply | Qty: 60 | Fill #6 | Status: AC

## 2020-06-10 MED FILL — ENVARSUS XR 1 MG TABLET,EXTENDED RELEASE: 30 days supply | Qty: 60 | Fill #1 | Status: AC

## 2020-06-10 MED FILL — OMEPRAZOLE 20 MG CAPSULE,DELAYED RELEASE: ORAL | 30 days supply | Qty: 30 | Fill #4

## 2020-06-10 MED FILL — OMEPRAZOLE 20 MG CAPSULE,DELAYED RELEASE: 30 days supply | Qty: 30 | Fill #4 | Status: AC

## 2020-06-10 MED FILL — ALLOPURINOL 100 MG TABLET: 30 days supply | Qty: 30 | Fill #11 | Status: AC

## 2020-06-10 MED FILL — METOPROLOL SUCCINATE ER 25 MG TABLET,EXTENDED RELEASE 24 HR: ORAL | 30 days supply | Qty: 60 | Fill #6

## 2020-06-12 ENCOUNTER — Ambulatory Visit: Admit: 2020-06-12 | Discharge: 2020-06-13 | Payer: MEDICARE

## 2020-06-12 ENCOUNTER — Ambulatory Visit: Admit: 2020-06-12 | Discharge: 2020-06-13 | Payer: MEDICARE | Attending: Nephrology | Primary: Nephrology

## 2020-06-12 DIAGNOSIS — Z79899 Other long term (current) drug therapy: Principal | ICD-10-CM

## 2020-06-12 DIAGNOSIS — Z940 Kidney transplant status: Principal | ICD-10-CM

## 2020-06-12 DIAGNOSIS — R52 Pain, unspecified: Principal | ICD-10-CM

## 2020-06-12 DIAGNOSIS — Z1159 Encounter for screening for other viral diseases: Principal | ICD-10-CM

## 2020-06-12 DIAGNOSIS — Z114 Encounter for screening for human immunodeficiency virus [HIV]: Principal | ICD-10-CM

## 2020-06-12 LAB — CBC W/ AUTO DIFF
BASOPHILS ABSOLUTE COUNT: 0 10*9/L (ref 0.0–0.1)
BASOPHILS RELATIVE PERCENT: 0.6 %
EOSINOPHILS RELATIVE PERCENT: 11.5 %
HEMATOCRIT: 44.4 % (ref 38.0–50.0)
HEMOGLOBIN: 14.4 g/dL (ref 13.5–17.5)
LYMPHOCYTES ABSOLUTE COUNT: 0.5 10*9/L — ABNORMAL LOW (ref 0.7–4.0)
LYMPHOCYTES RELATIVE PERCENT: 9 %
MEAN CORPUSCULAR HEMOGLOBIN CONC: 32.5 g/dL (ref 30.0–36.0)
MEAN CORPUSCULAR HEMOGLOBIN: 29.7 pg (ref 26.0–34.0)
MEAN CORPUSCULAR VOLUME: 91.5 fL (ref 81.0–95.0)
MEAN PLATELET VOLUME: 9.4 fL (ref 7.0–10.0)
MONOCYTES ABSOLUTE COUNT: 0.4 10*9/L (ref 0.1–1.0)
MONOCYTES RELATIVE PERCENT: 6 %
NEUTROPHILS ABSOLUTE COUNT: 4.3 10*9/L (ref 1.7–7.7)
NEUTROPHILS RELATIVE PERCENT: 72.9 %
NUCLEATED RED BLOOD CELLS: 0 /100{WBCs} (ref <=4)
PLATELET COUNT: 184 10*9/L (ref 150–450)
RED BLOOD CELL COUNT: 4.85 10*12/L (ref 4.32–5.72)
RED CELL DISTRIBUTION WIDTH: 15.1 % — ABNORMAL HIGH (ref 12.0–15.0)

## 2020-06-12 LAB — COMPREHENSIVE METABOLIC PANEL
ALBUMIN: 4.5 g/dL (ref 3.4–5.0)
ALKALINE PHOSPHATASE: 125 U/L — ABNORMAL HIGH (ref 46–116)
ALT (SGPT): 39 U/L (ref 10–49)
ANION GAP: 6 mmol/L (ref 3–11)
AST (SGOT): 25 U/L (ref <34)
BILIRUBIN TOTAL: 0.7 mg/dL (ref 0.3–1.2)
BUN / CREAT RATIO: 29
CALCIUM: 10.3 mg/dL (ref 8.7–10.4)
CHLORIDE: 106 mmol/L (ref 98–107)
CO2: 26.4 mmol/L (ref 20.0–31.0)
CREATININE: 0.66 mg/dL (ref 0.60–1.10)
EGFR CKD-EPI AA MALE: >90 mL/min/{1.73_m2}
EGFR CKD-EPI NON-AA MALE: >90 mL/min/{1.73_m2}
GLUCOSE RANDOM: 147 mg/dL — ABNORMAL HIGH (ref 70–99)
POTASSIUM: 4 mmol/L (ref 3.5–5.1)
PROTEIN TOTAL: 7.1 g/dL (ref 5.7–8.2)

## 2020-06-12 LAB — URINALYSIS
BACTERIA: NONE SEEN /HPF
BLOOD UA: NEGATIVE
GLUCOSE UA: NEGATIVE
KETONES UA: NEGATIVE
LEUKOCYTE ESTERASE UA: NEGATIVE
NITRITE UA: NEGATIVE
PH UA: 7.5 (ref 5.0–9.0)
PROTEIN UA: NEGATIVE
RBC UA: <1 /HPF (ref <3)
SPECIFIC GRAVITY UA: 1.015 (ref 1.005–1.030)
SQUAMOUS EPITHELIAL: <1 /HPF (ref 0–5)
UROBILINOGEN UA: 0.2
WBC UA: 2 /HPF — ABNORMAL HIGH (ref <2)

## 2020-06-12 LAB — UROBILINOGEN UA: Lab: 0.2

## 2020-06-12 LAB — FASTING

## 2020-06-12 LAB — LIPID PANEL
CHOLESTEROL/HDL RATIO SCREEN: 4.2 (ref 1.0–4.5)
HDL CHOLESTEROL: 35 mg/dL — ABNORMAL LOW (ref 40–60)
LDL CHOLESTEROL CALCULATED: 82 mg/dL (ref 40–100)
NON-HDL CHOLESTEROL: 112 mg/dL (ref 70–130)
TRIGLYCERIDES: 151 mg/dL — ABNORMAL HIGH (ref 0–150)
VLDL CHOLESTEROL CAL: 30.2 mg/dL (ref 12–47)

## 2020-06-12 LAB — MAGNESIUM: Magnesium:MCnc:Pt:Ser/Plas:Qn:: 1.9

## 2020-06-12 LAB — PROTEIN/CREAT RATIO, URINE: Protein/Creatinine:MRto:Pt:Urine:Qn:: 0.17

## 2020-06-12 LAB — BASOPHILS RELATIVE PERCENT: Basophils/100 leukocytes:NFr:Pt:Bld:Qn:Automated count: 0.6

## 2020-06-12 LAB — PROTEIN / CREATININE RATIO, URINE
CREATININE, URINE: 79.4 mg/dL
PROTEIN URINE: 13.5 mg/dL

## 2020-06-12 LAB — PHOSPHORUS: Phosphate:MCnc:Pt:Ser/Plas:Qn:: 2.9

## 2020-06-12 LAB — TACROLIMUS, TROUGH: Lab: 5.2

## 2020-06-12 LAB — POTASSIUM: Potassium:SCnc:Pt:Ser/Plas:Qn:: 4

## 2020-06-12 LAB — CALCIUM: Calcium:MCnc:Pt:Ser/Plas:Qn:: 9.9

## 2020-06-12 NOTE — Unmapped (Signed)
Transplant Nephrology Clinic Visit    History of Present Illness  [name] is a 59 y.o. 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  -Adequate perfusion of the transplant kidney. Resistive indices within the mid main renal artery and at the anastomosis are slightly elevated. Attention on follow-up.  ??  Current Immunosuppression Regimen:   - Myfortic 540mg  BID  - Envarsus 3mg  QD    Subjective/Interval:     Since patient's last visit in the transplant clinic - patient has been doing well in terms of transplant, taking transplant medications regularly, no episodes of rejection and no side effects of medications.    Today he presents with complaints of left knee and right shoulder pain. In terms of his left knee, he reports onset of pain a couple of months ago. Endorses pain at night or while sleeping and denies pain while ambulating. He reports onset of right shoulder pain post txp surgery. Endorses limited range of motion and denies any recent injuries to his right shoulder. He also reports normal appetite and improving his diet since his last visit. He has cut down on his white rice intake and has incorporated more veggies and nuts. Denies chest pain and dyspnea.      Takes his Envarsus at 7:30 am every morning.     Review of Systems  Otherwise as per HPI, all other systems reviewed and are negative.    Medications  Current Outpatient Medications   Medication Sig Dispense Refill   ??? allopurinoL (ZYLOPRIM) 100 MG tablet Take 1 tablet (100 mg total) by mouth daily. 30 tablet 11   ??? biotin 5 mg cap Take 1 capsule (5,000 mcg total) by mouth daily.  0   ??? magnesium oxide-Mg AA chelate (MAGNESIUM, AMINO ACID CHELATE,) 133 mg Tab Take 1 tablet by mouth Two (2) times a day. 60 tablet 11   ??? metFORMIN (GLUCOPHAGE-XR) 500 MG 24 hr tablet Take 1 tablet (500 mg total) by mouth daily with evening meal. 30 tablet 11   ??? metoprolol succinate (TOPROL XL) 25 MG 24 hr tablet Take 2 tablets (50 mg total) by mouth nightly. 60 tablet 11   ??? MYFORTIC 180 mg EC tablet Take 3 tablets (540 mg total) by mouth Two (2) times a day. 180 tablet 11   ??? omeprazole (PRILOSEC) 20 MG capsule Take 1 capsule (20 mg total) by mouth daily. 30 capsule 11   ??? tacrolimus (ENVARSUS XR) 1 mg Tb24 extended release tablet Take 2 tablets (2 mg total) by mouth daily. 60 tablet 11     No current facility-administered medications for this visit.       Physical  Exam  BP 116/75 (BP Site: R Arm, BP Position: Sitting, BP Cuff Size: Medium)  - Pulse 73  - Temp 36.4 ??C (97.6 ??F) (Temporal)  - Ht 160 cm (5' 3)  - Wt 64.9 kg (143 lb)  - BMI 25.33 kg/m??   General: Well appearing male, no acute distress  HEENT: wearing mask, EOMI, sclera anicteric;   Neck: wearing mask  CV: normal rate, normal rhythm, no murmur, no gallops, no rubs appreciated  Lungs: clear to auscultation bilaterally, normal work of breathing.  Abdomen: soft, non tender.  Extremities: no edema  Musculoskeletal: no visible deformity, normal range of motion left shoulder, decreased ROM right shoulder.  Pulses: intact distally throughout  Neurologic: awake, alert, and oriented x3    Laboratory Data and Imaging reviewed in EPIC    Assessment: [name] is a 59 y.o. 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     Recommendations/Plan:   Allograft Function: renal function stable at 0.66 with a baseline of approximately 0.7-0.9 since transplant. Will continue to monitor.     Immunosuppression Management [High Risk Medical Decision Making For Drug Therapy Requiring Intensive Monitoring For Toxicity]:  Continue Envarsus 3 mg daily. Targeting tacrolimus trough level of approximately 6-8. Continue Myfortic 540 mg BID. Today level is 5.2 - repeat before making any changes.    Blood Pressure Management: BP 116/75 at this visit. Continue Toprol-XL 50 mg at night. BP and heart rate have improved since the last dose increase. EKG done in 11/2019 was normal.     Infectious Prophylaxis and Monitoring:   Transitioned from Bactrim to Dapsone on 07/04/19 due to stable mild hyperkalemia.  10 decoys present 12/30/19. Last CMV VL detected at <50 (05/21/20). Holding Valcyte.    Pre-diabetes: Currently on metformin 500 mg daily. Home BG have been intermittently elevated; however, last A1c was 6.4 in 04/2020.Will monitor.    Hypercalcemia: Calcium 9.6 (05/21/20).   - s/p parathyroid surgery - 11/11/2019    Lower Back Pain with midline radiculopathy: Known underlying degenerative changes. Resolved per Patient.     Left Knee & Right Shoulder Pain:  - Placed order for XR L knee and R shoulder.  - Pending results will refer to PT.       Health Maintenance:   - Colonoscopy due 2029  - Blood work once a month    Immunizations:  - Vaccination: PCV-23 completed 07/06/2015.   - Flu shot: 08/06/19  - Prevnar 13 to be given at 1 year post  - Covid vaccine (Moderna): 05/10/20, 04/12/20    Counseling:  I counseled the patient on the need to avoid sun exposure and the use of sunblock while outdoors given the relatively higher risk of skin malignancy in an immunosuppressed state and the need for adherence to immunosuppression medication.  Patient verbalized understanding.     Follow-Up:  Return to clinic in 3 months  Patient will continue to follow-up with his primary care provider for non-transplant related issues and medication refills. We have ordered transplant specific labs per the center's guidelines to monitor and assess for toxicities from immunosuppressant drug therapy    Scribe's Attestation: Leeroy Bock, MD obtained and performed the history, physical exam and medical decision making elements that were entered into the chart.  Signed by Sharion Balloon, Scribe, on June 12, 2020 at 11:09 AM.    Documentation assistance provided by the Scribe. I was present during  the time the encounter was recorded. The information recorded by the Scribe was done at my direction and has been reviewed and validated by me.

## 2020-06-12 NOTE — Unmapped (Signed)
Saw patient in clinic, reports he is doing well    Reports left inner knee and right shoulder pain for past 10 months. Pain with exercise. Reports pain at night when sleeping. Unable to raise right arm with a lot of pain. Denies any injury. Thinks  that since transplant he has been trying to walk/exercise and that is when pain started.    Labs today and took envarsus at 745 before labs    Denies HA, CP, SOB, abdominal pain, n/v/d, urinary problems, fever, tremors, swelling, or numbness/tingling.    Reports some intermittent dizziness with bending over. Denies dizziness with position changes.    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

## 2020-06-12 NOTE — Unmapped (Signed)
AOBP:right arm medium  cuff   Average:116/75 Pulse:73  1st reading:117/76Pulse:70  2nd reading: 121/78 :Pulse:73  3rd reading:111/70 Pulse:75

## 2020-06-13 LAB — CMV VIRAL LD: Lab: NOT DETECTED

## 2020-06-13 LAB — CMV DNA, QUANTITATIVE, PCR: CMV VIRAL LD: NOT DETECTED

## 2020-06-14 LAB — HEPATITIS B SURFACE ANTIGEN: Hepatitis B virus surface Ag:PrThr:Pt:Ser:Ord:: NONREACTIVE

## 2020-06-14 LAB — HIV ANTIGEN/ANTIBODY COMBO: HIV 1+2 Ab+HIV1 p24 Ag:PrThr:Pt:Ser/Plas:Ord:IA: NONREACTIVE

## 2020-06-14 LAB — HEPATITIS B CORE TOTAL ANTIBODY: Hepatitis B virus core Ab:PrThr:Pt:Ser/Plas:Ord:IA: NONREACTIVE

## 2020-06-14 LAB — HEPATITIS C ANTIBODY: Hepatitis C virus Ab:PrThr:Pt:Ser:Ord:: NONREACTIVE

## 2020-06-15 LAB — EBV VIRAL LOAD RESULT: Lab: NOT DETECTED

## 2020-06-15 LAB — VITAMIN D, TOTAL (25OH): Lab: 20.6

## 2020-06-22 LAB — HLA DS POST TRANSPLANT
ANTI-DONOR DRW #1 MFI: 75 MFI
ANTI-DONOR DRW #2 MFI: 108 MFI
ANTI-DONOR HLA-A #1 MFI: 0 MFI
ANTI-DONOR HLA-A #2 MFI: 0 MFI
ANTI-DONOR HLA-B #1 MFI: 0 MFI
ANTI-DONOR HLA-B #2 MFI: 0 MFI
ANTI-DONOR HLA-C #1 MFI: 0 MFI
ANTI-DONOR HLA-C #2 MFI: 0 MFI
ANTI-DONOR HLA-DQB #2 MFI: 110 MFI
ANTI-DONOR HLA-DR #1 MFI: 40 MFI
ANTI-DONOR HLA-DR #2 MFI: 72 MFI

## 2020-06-22 LAB — HLA CL2 AB RESULT: Lab: NEGATIVE

## 2020-06-22 LAB — ANTI-DONOR HLA-A #1 MFI: Lab: 0

## 2020-06-22 LAB — FSAB CLASS 1 ANTIBODY SPECIFICITY: HLA CLASS 1 ANTIBODY RESULT: POSITIVE

## 2020-06-22 LAB — HLA CLASS 1 ANTIBODY

## 2020-06-30 ENCOUNTER — Ambulatory Visit: Admit: 2020-06-30 | Discharge: 2020-07-01 | Payer: MEDICARE

## 2020-06-30 LAB — BASIC METABOLIC PANEL
ANION GAP: 6 mmol/L (ref 5–14)
BLOOD UREA NITROGEN: 19 mg/dL (ref 9–23)
BUN / CREAT RATIO: 31
CALCIUM: 9.8 mg/dL (ref 8.7–10.4)
CHLORIDE: 106 mmol/L (ref 98–107)
CO2: 26.8 mmol/L (ref 20.0–31.0)
CREATININE: 0.61 mg/dL
EGFR CKD-EPI AA MALE: >90 mL/min/{1.73_m2} (ref >=60)
EGFR CKD-EPI NON-AA MALE: >90 mL/min/{1.73_m2} (ref >=60)
GLUCOSE RANDOM: 146 mg/dL (ref 70–179)
SODIUM: 139 mmol/L (ref 135–145)

## 2020-06-30 LAB — MAGNESIUM: Magnesium:MCnc:Pt:Ser/Plas:Qn:: 1.9

## 2020-06-30 LAB — CBC W/ AUTO DIFF
BASOPHILS ABSOLUTE COUNT: 0 10*9/L (ref 0.0–0.1)
BASOPHILS RELATIVE PERCENT: 0.6 %
EOSINOPHILS ABSOLUTE COUNT: 0.6 10*9/L (ref 0.0–0.7)
EOSINOPHILS RELATIVE PERCENT: 9.4 %
HEMATOCRIT: 44.8 % (ref 38.0–50.0)
LYMPHOCYTES ABSOLUTE COUNT: 0.5 10*9/L — ABNORMAL LOW (ref 0.7–4.0)
LYMPHOCYTES RELATIVE PERCENT: 8.7 %
MEAN CORPUSCULAR HEMOGLOBIN CONC: 33.9 g/dL (ref 30.0–36.0)
MEAN CORPUSCULAR HEMOGLOBIN: 31.1 pg (ref 26.0–34.0)
MEAN CORPUSCULAR VOLUME: 91.7 fL (ref 81.0–95.0)
MEAN PLATELET VOLUME: 10.1 fL — ABNORMAL HIGH (ref 7.0–10.0)
MONOCYTES ABSOLUTE COUNT: 0.5 10*9/L (ref 0.1–1.0)
MONOCYTES RELATIVE PERCENT: 8 %
NEUTROPHILS ABSOLUTE COUNT: 4.6 10*9/L (ref 1.7–7.7)
NEUTROPHILS RELATIVE PERCENT: 73.3 %
NUCLEATED RED BLOOD CELLS: 0 /100{WBCs} (ref <=4)
PLATELET COUNT: 212 10*9/L (ref 150–450)
RED BLOOD CELL COUNT: 4.88 10*12/L (ref 4.32–5.72)
RED CELL DISTRIBUTION WIDTH: 15 % (ref 12.0–15.0)
WBC ADJUSTED: 6.3 10*9/L (ref 3.5–10.5)

## 2020-06-30 LAB — CREATININE: Creatinine:MCnc:Pt:Ser/Plas:Qn:: 0.61

## 2020-06-30 LAB — RED CELL DISTRIBUTION WIDTH: Lab: 15

## 2020-06-30 LAB — PHOSPHORUS: Phosphate:MCnc:Pt:Ser/Plas:Qn:: 0

## 2020-06-30 LAB — TACROLIMUS, TROUGH: Lab: 7.8

## 2020-07-01 LAB — CMV DNA, QUANTITATIVE, PCR: CMV VIRAL LD: NOT DETECTED

## 2020-07-01 LAB — CMV VIRAL LD: Lab: NOT DETECTED

## 2020-07-07 MED ORDER — ALLOPURINOL 100 MG TABLET: 100 mg | tablet | Freq: Every day | 11 refills | 30 days | Status: AC

## 2020-07-07 MED ORDER — ALLOPURINOL 100 MG TABLET
ORAL_TABLET | Freq: Every day | ORAL | 11 refills | 30.00000 days | Status: CP
Start: 2020-07-07 — End: 2021-07-07
  Filled 2020-07-15: qty 30, 30d supply, fill #0

## 2020-07-07 NOTE — Unmapped (Signed)
[facility] Clinical Assessment & Refill Coordination Note    Louis Dennis, Louis Dennis: 1960/12/24  Phone: [phone] (home)     All above HIPAA information was verified with patient.     Was a Nurse, learning disability used for this call? No    Specialty Medication(s):   Transplant: Envarsus 1mg  and Myfortic 180mg      Current Outpatient Medications   Medication Sig Dispense Refill   ??? allopurinoL (ZYLOPRIM) 100 MG tablet Take 1 tablet (100 mg total) by mouth daily. 30 tablet 11   ??? biotin 5 mg cap Take 1 capsule (5,000 mcg total) by mouth daily.  0   ??? magnesium oxide-Mg AA chelate (MAGNESIUM, AMINO ACID CHELATE,) 133 mg Tab Take 1 tablet by mouth Two (2) times a day. 60 tablet 11   ??? metFORMIN (GLUCOPHAGE-XR) 500 MG 24 hr tablet Take 1 tablet (500 mg total) by mouth daily with evening meal. 30 tablet 11   ??? metoprolol succinate (TOPROL XL) 25 MG 24 hr tablet Take 2 tablets (50 mg total) by mouth nightly. 60 tablet 11   ??? MYFORTIC 180 mg EC tablet Take 3 tablets (540 mg total) by mouth Two (2) times a day. 180 tablet 11   ??? omeprazole (PRILOSEC) 20 MG capsule Take 1 capsule (20 mg total) by mouth daily. 30 capsule 11   ??? tacrolimus (ENVARSUS XR) 1 mg Tb24 extended release tablet Take 2 tablets (2 mg total) by mouth daily. 60 tablet 11     No current facility-administered medications for this visit.        Changes to medications: Louis Dennis reports no changes at this time.    No Known Allergies    Changes to allergies: No    SPECIALTY MEDICATION ADHERENCE     Envarsus Xr 1 mg: 13 days of medicine on hand   Myfortic 180 mg: 13 days of medicine on hand       Medication Adherence    Patient reported X missed doses in the last month: 0  Specialty Medication: Myfortic 180mg   Patient is on additional specialty medications: Yes  Additional Specialty Medications: Envarsus Xr 1mg   Patient Reported Additional Medication X Missed Doses in the Last Month: 0  Patient is on more than two specialty medications: No Specialty medication(s) dose(s) confirmed: Regimen is correct and unchanged.     Are there any concerns with adherence? No    Adherence counseling provided? Not needed    CLINICAL MANAGEMENT AND INTERVENTION      Clinical Benefit Assessment:    Do you feel the medicine is effective or helping your condition? Yes    Clinical Benefit counseling provided? Not needed    Adverse Effects Assessment:    Are you experiencing any side effects? No    Are you experiencing difficulty administering your medicine? No    Quality of Life Assessment:    How many days over the past month did your kidney transplant  keep you from your normal activities? For example, brushing your teeth or getting up in the morning. 0    Have you discussed this with your provider? Not needed    Therapy Appropriateness:    Is therapy appropriate? Yes, therapy is appropriate and should be continued    DISEASE/MEDICATION-SPECIFIC INFORMATION      N/A    PATIENT SPECIFIC NEEDS     - Does the patient have any physical, cognitive, or cultural barriers? No    - Is the patient high risk? Yes, patient  is taking a REMS drug. Medication is dispensed in compliance with REMS program    - Does the patient require a Care Management Plan? No     - Does the patient require physician intervention or other additional services (i.e. nutrition, smoking cessation, social work)? No      SHIPPING     Specialty Medication(s) to be Shipped:   Transplant: Envarsus 1mg  and Myfortic 180mg     Other medication(s) to be shipped: omeprazole, metoprolol, allopurinol     Changes to insurance: No    Delivery Scheduled: Yes, Expected medication delivery date: 07/16/20.     Medication will be delivered via Next Day Courier to the confirmed prescription address in [facility].    The patient will receive a drug information handout for each medication shipped and additional FDA Medication Guides as required.  Verified that patient has previously received a Conservation officer, historic buildings.    All of the patient's questions and concerns have been addressed.    Louis Dennis   [facility] Specialty Pharmacist

## 2020-07-15 MED FILL — MYFORTIC 180 MG TABLET,DELAYED RELEASE: 30 days supply | Qty: 180 | Fill #5 | Status: AC

## 2020-07-15 MED FILL — OMEPRAZOLE 20 MG CAPSULE,DELAYED RELEASE: 30 days supply | Qty: 30 | Fill #5 | Status: AC

## 2020-07-15 MED FILL — METOPROLOL SUCCINATE ER 25 MG TABLET,EXTENDED RELEASE 24 HR: 30 days supply | Qty: 60 | Fill #7 | Status: AC

## 2020-07-15 MED FILL — ALLOPURINOL 100 MG TABLET: 30 days supply | Qty: 30 | Fill #0 | Status: AC

## 2020-07-15 MED FILL — ENVARSUS XR 1 MG TABLET,EXTENDED RELEASE: ORAL | 30 days supply | Qty: 60 | Fill #2

## 2020-07-15 MED FILL — OMEPRAZOLE 20 MG CAPSULE,DELAYED RELEASE: ORAL | 30 days supply | Qty: 30 | Fill #5

## 2020-07-15 MED FILL — MYFORTIC 180 MG TABLET,DELAYED RELEASE: ORAL | 30 days supply | Qty: 180 | Fill #5

## 2020-07-15 MED FILL — METOPROLOL SUCCINATE ER 25 MG TABLET,EXTENDED RELEASE 24 HR: ORAL | 30 days supply | Qty: 60 | Fill #7

## 2020-07-15 MED FILL — ENVARSUS XR 1 MG TABLET,EXTENDED RELEASE: 30 days supply | Qty: 60 | Fill #2 | Status: AC

## 2020-07-22 ENCOUNTER — Ambulatory Visit: Admit: 2020-07-22 | Discharge: 2020-07-23 | Payer: MEDICARE

## 2020-07-22 LAB — PHOSPHORUS: Phosphate:MCnc:Pt:Ser/Plas:Qn:: 3.1

## 2020-07-22 LAB — CBC W/ AUTO DIFF
BASOPHILS ABSOLUTE COUNT: 0.1 10*9/L (ref 0.0–0.1)
EOSINOPHILS ABSOLUTE COUNT: 0.7 10*9/L (ref 0.0–0.7)
EOSINOPHILS RELATIVE PERCENT: 11.7 %
HEMATOCRIT: 44.3 % (ref 38.0–50.0)
HEMOGLOBIN: 14.7 g/dL (ref 13.5–17.5)
LYMPHOCYTES ABSOLUTE COUNT: 0.6 10*9/L — ABNORMAL LOW (ref 0.7–4.0)
LYMPHOCYTES RELATIVE PERCENT: 10.5 %
MEAN CORPUSCULAR HEMOGLOBIN CONC: 33.2 g/dL (ref 30.0–36.0)
MEAN CORPUSCULAR HEMOGLOBIN: 30.3 pg (ref 26.0–34.0)
MEAN CORPUSCULAR VOLUME: 91.3 fL (ref 81.0–95.0)
MEAN PLATELET VOLUME: 9.7 fL (ref 7.0–10.0)
MONOCYTES ABSOLUTE COUNT: 0.4 10*9/L (ref 0.1–1.0)
MONOCYTES RELATIVE PERCENT: 7.6 %
NEUTROPHILS RELATIVE PERCENT: 69.2 %
PLATELET COUNT: 193 10*9/L (ref 150–450)
RED BLOOD CELL COUNT: 4.85 10*12/L (ref 4.32–5.72)
RED CELL DISTRIBUTION WIDTH: 14.8 % (ref 12.0–15.0)

## 2020-07-22 LAB — BASIC METABOLIC PANEL
ANION GAP: 5 mmol/L (ref 5–14)
BLOOD UREA NITROGEN: 18 mg/dL (ref 9–23)
BUN / CREAT RATIO: 27
CHLORIDE: 106 mmol/L (ref 98–107)
CREATININE: 0.66 mg/dL
EGFR CKD-EPI AA MALE: >90 mL/min/{1.73_m2} (ref >=60)
EGFR CKD-EPI NON-AA MALE: >90 mL/min/{1.73_m2} (ref >=60)
GLUCOSE RANDOM: 133 mg/dL (ref 70–179)
POTASSIUM: 4.5 mmol/L (ref 3.4–4.5)
SODIUM: 138 mmol/L (ref 135–145)

## 2020-07-22 LAB — MAGNESIUM: Magnesium:MCnc:Pt:Ser/Plas:Qn:: 1.9

## 2020-07-22 LAB — BUN / CREAT RATIO: Urea nitrogen/Creatinine:MRto:Pt:Ser/Plas:Qn:: 27

## 2020-07-22 LAB — EOSINOPHILS ABSOLUTE COUNT: Eosinophils:NCnc:Pt:Bld:Qn:Automated count: 0.7

## 2020-07-22 LAB — TACROLIMUS, TROUGH: Lab: 7

## 2020-07-23 LAB — CMV DNA, QUANTITATIVE, PCR: CMV VIRAL LD: NOT DETECTED

## 2020-07-23 LAB — CMV COMMENT: Lab: 0

## 2020-07-24 NOTE — Unmapped (Signed)
Patient called stating that his left eye is dry and sometimes he has minimal pain/discomfort.  No vision changes or any other complaints.  Patient does not use artificial tears.    Advised patient to try using Refresh or Systane 4 times a day over the next week and if this does not help or gets worse to call use back.

## 2020-08-06 NOTE — Unmapped (Signed)
[facility] Refill Coordination Note    Specialty Medication(s) to be Shipped:   Transplant: Envarsus 1mg  and Myfortic 180mg     Other medication(s) to be shipped: magnesium, omeprazole 20mg , metoprolol succ 25mg  and allopurinol 100mg      Louis Dennis, DOB: 03/11/1961  Phone: [phone] (home)       All above HIPAA information was verified with patient.     Was a Nurse, learning disability used for this call? Yes, Griffin Basil. Patient language is appropriate in [facility]    Completed refill call assessment today to schedule patient's medication shipment from the [facility] [phone]).       Specialty medication(s) and dose(s) confirmed: Regimen is correct and unchanged.   Changes to medications: Louis Dennis reports no changes at this time.  Changes to insurance: No  Questions for the pharmacist: No    Confirmed patient received Welcome Packet with first shipment. The patient will receive a drug information handout for each medication shipped and additional FDA Medication Guides as required.       DISEASE/MEDICATION-SPECIFIC INFORMATION        N/A    SPECIALTY MEDICATION ADHERENCE     Medication Adherence    Patient reported X missed doses in the last month: 0  Specialty Medication: Envarsus 1mg   Patient is on additional specialty medications: Yes  Additional Specialty Medications: Myfortic 180mg   Patient Reported Additional Medication X Missed Doses in the Last Month: 0  Patient is on more than two specialty medications: No        Envarsus 1 mg: 8 days of medicine on hand   Myfortic 180 mg: 8 days of medicine on hand     SHIPPING     Shipping address confirmed in Epic.     Delivery Scheduled: Yes, Expected medication delivery date: 08/13/2020.     Medication will be delivered via Next Day Courier to the prescription address in Epic WAM.    [name]   [facility] Specialty Technician

## 2020-08-12 MED FILL — METOPROLOL SUCCINATE ER 25 MG TABLET,EXTENDED RELEASE 24 HR: 30 days supply | Qty: 60 | Fill #8 | Status: AC

## 2020-08-12 MED FILL — ALLOPURINOL 100 MG TABLET: ORAL | 30 days supply | Qty: 30 | Fill #1

## 2020-08-12 MED FILL — OMEPRAZOLE 20 MG CAPSULE,DELAYED RELEASE: ORAL | 30 days supply | Qty: 30 | Fill #6

## 2020-08-12 MED FILL — OMEPRAZOLE 20 MG CAPSULE,DELAYED RELEASE: 30 days supply | Qty: 30 | Fill #6 | Status: AC

## 2020-08-12 MED FILL — MG-PLUS-PROTEIN 133 MG TABLET: ORAL | 30 days supply | Qty: 60 | Fill #0

## 2020-08-12 MED FILL — ENVARSUS XR 1 MG TABLET,EXTENDED RELEASE: 30 days supply | Qty: 60 | Fill #3 | Status: AC

## 2020-08-12 MED FILL — ALLOPURINOL 100 MG TABLET: 30 days supply | Qty: 30 | Fill #1 | Status: AC

## 2020-08-12 MED FILL — MG-PLUS-PROTEIN 133 MG TABLET: 30 days supply | Qty: 60 | Fill #0 | Status: AC

## 2020-08-12 MED FILL — METOPROLOL SUCCINATE ER 25 MG TABLET,EXTENDED RELEASE 24 HR: ORAL | 30 days supply | Qty: 60 | Fill #8

## 2020-08-12 MED FILL — ENVARSUS XR 1 MG TABLET,EXTENDED RELEASE: ORAL | 30 days supply | Qty: 60 | Fill #3

## 2020-08-12 MED FILL — MYFORTIC 180 MG TABLET,DELAYED RELEASE: ORAL | 30 days supply | Qty: 180 | Fill #6

## 2020-08-12 MED FILL — MYFORTIC 180 MG TABLET,DELAYED RELEASE: 30 days supply | Qty: 180 | Fill #6 | Status: AC

## 2020-08-13 ENCOUNTER — Ambulatory Visit: Admit: 2020-08-13 | Discharge: 2020-08-14 | Payer: MEDICARE

## 2020-08-13 LAB — CBC W/ AUTO DIFF
BASOPHILS ABSOLUTE COUNT: 0.1 10*9/L (ref 0.0–0.1)
BASOPHILS RELATIVE PERCENT: 1.1 %
EOSINOPHILS ABSOLUTE COUNT: 0.7 10*9/L (ref 0.0–0.7)
HEMATOCRIT: 42.9 % (ref 38.0–50.0)
HEMOGLOBIN: 14.4 g/dL (ref 13.5–17.5)
LYMPHOCYTES ABSOLUTE COUNT: 0.5 10*9/L — ABNORMAL LOW (ref 0.7–4.0)
LYMPHOCYTES RELATIVE PERCENT: 8.8 %
MEAN CORPUSCULAR HEMOGLOBIN CONC: 33.5 g/dL (ref 30.0–36.0)
MEAN CORPUSCULAR VOLUME: 91.8 fL (ref 81.0–95.0)
MEAN PLATELET VOLUME: 9.6 fL (ref 7.0–10.0)
MONOCYTES ABSOLUTE COUNT: 0.5 10*9/L (ref 0.1–1.0)
MONOCYTES RELATIVE PERCENT: 8.9 %
NEUTROPHILS ABSOLUTE COUNT: 3.8 10*9/L (ref 1.7–7.7)
NEUTROPHILS RELATIVE PERCENT: 69.3 %
NUCLEATED RED BLOOD CELLS: 0 /100{WBCs} (ref <=4)
PLATELET COUNT: 182 10*9/L (ref 150–450)
RED CELL DISTRIBUTION WIDTH: 15.1 % — ABNORMAL HIGH (ref 12.0–15.0)
WBC ADJUSTED: 5.5 10*9/L (ref 3.5–10.5)

## 2020-08-13 LAB — BASIC METABOLIC PANEL
ANION GAP: 2 mmol/L — ABNORMAL LOW (ref 5–14)
BLOOD UREA NITROGEN: 23 mg/dL (ref 9–23)
CALCIUM: 9.9 mg/dL (ref 8.7–10.4)
CHLORIDE: 107 mmol/L (ref 98–107)
CO2: 27.6 mmol/L (ref 20.0–31.0)
CREATININE: 0.64 mg/dL
EGFR CKD-EPI AA MALE: >90 mL/min/{1.73_m2} (ref >=60)
EGFR CKD-EPI NON-AA MALE: >90 mL/min/{1.73_m2} (ref >=60)
GLUCOSE RANDOM: 126 mg/dL (ref 70–179)
SODIUM: 137 mmol/L (ref 135–145)

## 2020-08-13 LAB — PHOSPHORUS: Phosphate:MCnc:Pt:Ser/Plas:Qn:: 3.2

## 2020-08-13 LAB — MEAN CORPUSCULAR VOLUME: Erythrocyte mean corpuscular volume:EntVol:Pt:RBC:Qn:Automated count: 91.8

## 2020-08-13 LAB — TACROLIMUS, TROUGH: Lab: 6.1

## 2020-08-13 LAB — MAGNESIUM: Magnesium:MCnc:Pt:Ser/Plas:Qn:: 1.9

## 2020-08-13 LAB — CALCIUM: Calcium:MCnc:Pt:Ser/Plas:Qn:: 9.9

## 2020-08-14 LAB — CMV DNA, QUANTITATIVE, PCR

## 2020-08-14 LAB — CMV VIRAL LD: Lab: NOT DETECTED

## 2020-08-27 NOTE — Unmapped (Signed)
Assessment and Plan:     Louis Dennis was seen today for hypertension.    Diagnoses and all orders for this visit:    Renovascular hypertension  BP at goal (100/52 in clinic today). Continue metoprolol 100 mg HS. Reviewed low sodium diet and encouraged regular exercise. Advised to continue to monitor and log at-home BP readings.    Hx of skin pruritus  -     triamcinolone (KENALOG) 0.1 % cream; Apply topically Two (2) times a day.    Need for influenza vaccination  -     INFLUENZA INJ MDCK PF, QUAD (FLUCELVAX)(2Y AND UP EGG FREE)    Pre-diabetes  Managed by nephrology. Continue metformin 500 mg daily.     I personally spent 30 minutes face-to-face and non-face-to-face in the care of this patient, which includes all pre, intra, and post visit time on the date of service.  Over 50% spent in face to face counseling and education regarding BP and BS management in addition to medication, home care for shoulder and leg pain to include stretching exercises.     Return in about 4 months (around 12/31/2020) for Next scheduled follow up.    HPI:      Louis Dennis  is here for   Chief Complaint   Patient presents with   ??? Hypertension     Hypertension: Patient presents for follow-up of hypertension. Blood pressure goal < 140/90.  Hypertension has customarily been at goal.  Home blood pressure readings: did not bring log. Salt intake and diet: salt not added to cooking and salt shaker not on table. Associated signs and symptoms: none. Patient denies: blurred vision, chest pain, dyspnea, headache, neck aches, orthopnea, palpitations, paroxysmal nocturnal dyspnea, peripheral edema, pulsating in the ears and tiredness/fatigue. Medication compliance: taking as prescribed. He is doing regular exercise. Walking daily, 30 minutes to one hour.     Diabetes: Patient presents for follow up of diabetes.  A1C goal is <8.  Diabetes has customarily been at goal. Current symptoms include: none. Symptoms have been well-controlled. Patient denies foot ulcerations, hyperglycemia, hypoglycemia , increased appetite, nausea, paresthesia of the feet, polydipsia, polyuria, visual disturbances, vomiting and weight loss. Evaluation to date has included: fasting blood sugar and hemoglobin A1C.  Home sugars: 100-110 fasting home BS. Current treatment: Continued metformin  which has been effective.  Doing regular exercise: no. He has cut back on carbohydrate intake - limiting breads, rice.    Nephrology is closely monitoring A1c.     Patient experiencing left knee and bilateral shoulder pain. Knee pain abates when he avoids exercise for a few days.     Patient received a 3rd Moderna vaccine 2 weeks ago.      ROS:      Comprehensive 10 point ROS negative unless otherwise stated in the HPI.      [facility] Components:     Medication adherence and barriers to the treatment plan have been addressed. Opportunities to optimize healthy behaviors have been discussed. Patient / caregiver voiced understanding.    Past Medical/Surgical History:     Past Medical History:   Diagnosis Date   ??? Abnormal thyroid function test    ??? End stage renal disease (CMS-HCC)     now on peritoneal dialysis   ??? Gout     reports was in Right hand, foot and left elbow   ??? Hypertension     takes meds intermitttently based on readings   ??? Nephrolithiasis 2014   ??? Visual impairment  glasses     Past Surgical History:   Procedure Laterality Date   ??? OTHER SURGICAL HISTORY Right 08/2017    Pt had catheter moved from left side to right side.   ??? pd catheter  2012   ??? PR COLONOSCOPY W/BIOPSY SINGLE/MULTIPLE N/A 06/15/2018    Procedure: COLONOSCOPY, FLEXIBLE, PROXIMAL TO SPLENIC FLEXURE; WITH BIOPSY, SINGLE OR MULTIPLE;  Surgeon: Charm Rings, MD;  Location: [facility];  Service: Gastroenterology   ??? PR COLSC FLX W/RMVL OF TUMOR POLYP LESION SNARE TQ N/A 06/15/2018    Procedure: COLONOSCOPY FLEX; W/REMOV TUMOR/LES BY SNARE;  Surgeon: Charm Rings, MD;  Location: [facility]  Service: Gastroenterology   ??? PR EXPLORATORY OF ABDOMEN N/A 11/22/2018    Procedure: EXPLORATORY LAPAROTOMY, EXPLORATORY CELIOTOMY WITH OR WITHOUT BIOPSY(S);  Surgeon: Lawrence Marseilles Day Thurnell Lose, MD;  Location: MAIN OR [facility];  Service: Trauma   ??? PR EXPLORE PARATHYROID GLANDS N/A 11/11/2019    Procedure: RS 22 PARATHYROIDECTOMY OR EXPLORATION OF PARATHYROID(S);  Surgeon: Johny Chess, MD;  Location: MAIN OR [facility];  Service: Surgical Oncology   ??? PR FREEING BOWEL ADHESION,ENTEROLYSIS N/A 11/22/2018    Procedure: Enterolysis (Separt Proc);  Surgeon: Lawrence Marseilles Day Thurnell Lose, MD;  Location: MAIN OR [facility];  Service: Trauma   ??? PR REMOVE PERITONEAL FOREIGN BODY N/A 11/22/2018    Procedure: Removal Of Peritoneal Of Foreign Body From Peritoneal Cavity;  Surgeon: Lawrence Marseilles Day Thurnell Lose, MD;  Location: MAIN OR [facility];  Service: Trauma   ??? PR TRANSPLANT,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    Procedure: [facility] STD PREP CAD DONR RENAL ALLOGFT PRIOR TO TRNSPLNT, INCL DISSEC/REM PERINEPH FAT, DIAPH/RTPER ATTAC;  Surgeon: Doyce Loose, MD;  Location: MAIN OR [facility];  Service: Transplant   ??? PR TRANSPLANTATION OF KIDNEY N/A 05/24/2019    Procedure: RENAL ALLOTRANSPLANTATION, IMPLANTATION OF GRAFT; WITHOUT RECIPIENT NEPHRECTOMY;  Surgeon: Doyce Loose, MD;  Location: MAIN OR [facility];  Service: Transplant       Family History:     Family History   Problem Relation Age of Onset   ??? Hypertension Mother    ??? Kidney disease Mother    ??? Diabetes Mother    ??? Hypertension Father    ??? Kidney disease Sister         s/p transplant   ??? Hypertension Brother    ??? Glaucoma Neg Hx    ??? Amblyopia Neg Hx    ??? Blindness Neg Hx    ??? Retinal detachment Neg Hx    ??? Strabismus Neg Hx    ??? Macular degeneration Neg Hx    ??? Basal cell carcinoma Neg Hx    ??? Melanoma Neg Hx    ??? Squamous cell carcinoma Neg Hx        Social History:     Social History     Tobacco Use   ??? Smoking status: Former Smoker     Packs/day: 1.00 Years: 30.00     Pack years: 30.00     Types: Cigarettes     Quit date: 07/10/2009     Years since quitting: 11.1   ??? Smokeless tobacco: Never Used   Vaping Use   ??? Vaping Use: Never used   Substance Use Topics   ??? Alcohol use: No     Alcohol/week: 0.0 standard drinks   ??? Drug use: No       Allergies:     Patient has no known allergies.  Current Medications:     Current Outpatient Medications   Medication Sig Dispense Refill   ??? allopurinoL (ZYLOPRIM) 100 MG tablet Take 1 tablet (100 mg total) by mouth daily. 30 tablet 11   ??? biotin 5 mg cap Take 1 capsule (5,000 mcg total) by mouth daily.  0   ??? magnesium oxide-Mg AA chelate (MAGNESIUM, AMINO ACID CHELATE,) 133 mg Tab Take 1 tablet by mouth Two (2) times a day. 60 tablet 11   ??? metFORMIN (GLUCOPHAGE-XR) 500 MG 24 hr tablet Take 1 tablet (500 mg total) by mouth daily with evening meal. 30 tablet 11   ??? metoprolol succinate (TOPROL XL) 25 MG 24 hr tablet Take 2 tablets (50 mg total) by mouth nightly. 60 tablet 11   ??? MYFORTIC 180 mg EC tablet Take 3 tablets (540 mg total) by mouth Two (2) times a day. 180 tablet 11   ??? omeprazole (PRILOSEC) 20 MG capsule Take 1 capsule (20 mg total) by mouth daily. 30 capsule 11   ??? tacrolimus (ENVARSUS XR) 1 mg Tb24 extended release tablet Take 2 tablets (2 mg total) by mouth daily. 60 tablet 11     No current facility-administered medications for this visit.       Health Maintenance:     Health Maintenance   Topic Date Due   ??? Zoster Vaccines (1 of 2) Never done   ??? COVID-19 Vaccine (3 - Moderna risk 3-dose series) 06/07/2020   ??? Influenza Vaccine (1) 08/05/2020   ??? Lipid Screening  06/12/2025   ??? Colonoscopy  06/15/2028   ??? DTaP/Tdap/Td Vaccines (2 - Td or Tdap) 12/30/2029   ??? Hepatitis C Screen  Completed       Immunizations:     Immunization History   Administered Date(s) Administered   ??? COVID-19 VACCINE,MRNA(MODERNA)(PF)(IM) 04/12/2020, 05/10/2020   ??? INFLUENZA INJ MDCK PF, Quad (Flucelvax)(2y and up Egg Free) 09/04/2017   ??? INFLUENZA TIV (TRI) PF (IM) 08/17/2011, 08/08/2012, 08/05/2013, 08/28/2014, 08/11/2015, 08/29/2015   ??? Influenza Vaccine Quad (IIV4 PF) 53mo+ injectable 08/16/2015, 09/06/2016, 09/11/2017, 08/06/2019   ??? Influenza Virus Vaccine, unspecified formulation 08/30/2016, 09/18/2017, 09/04/2018, 10/02/2018   ??? PNEUMOCOCCAL POLYSACCHARIDE 23 07/06/2015   ??? PPD Test 09/08/2011, 09/07/2012, 09/05/2013, 09/09/2014, 09/07/2015, 08/10/2016, 09/11/2017, 09/05/2018, 01/03/2019   ??? Pneumococcal Conjugate 13-Valent 07/24/2010, 07/15/2015   ??? TdaP 12/31/2019     I have reviewed and (if needed) updated the patient's problem list, medications, allergies, past medical and surgical history, social and family history.     Vital Signs:     Wt Readings from Last 3 Encounters:   08/31/20 64.4 kg (142 lb)   06/12/20 64.9 kg (143 lb)   05/06/20 64.4 kg (141 lb 14.4 oz)     Temp Readings from Last 3 Encounters:   08/31/20 36.4 ??C (97.6 ??F)   06/12/20 36.4 ??C (97.6 ??F) (Temporal)   05/06/20 36.6 ??C (97.8 ??F) (Oral)     BP Readings from Last 3 Encounters:   08/31/20 100/52   06/12/20 116/75   05/06/20 110/70     Pulse Readings from Last 3 Encounters:   08/31/20 85   06/12/20 73   05/06/20 62     Estimated body mass index is 25.33 kg/m?? as calculated from the following:    Height as of 06/12/20: 160 cm (5' 3).    Weight as of 06/12/20: 64.9 kg (143 lb).  No height and weight on file for this encounter.      Objective:  General: Alert and oriented x3. Well-appearing. No acute distress.   HEENT:  Normocephalic.  Atraumatic. Conjunctiva and sclera normal. OP MMM without lesions.   Neck:  Supple. No thyroid enlargement. No adenopathy.   Heart:  Regular rate and rhythm . Normal S1, S2.  No murmurs, rubs or gallops.   Lungs:  No respiratory distress.  Lungs clear to auscultation. No wheezes, rhonchi, or rales.   GI/GU:  Soft, +BS, nondistended, non-TTP. No palpable masses or organomegaly.   Extremities:  No edema. Peripheral pulses normal.   Skin:  Warm, dry. No rash or lesions present.   Neuro:  Non-focal. No obvious weakness.   Psych:  Affect normal, eye contact good, speech clear and coherent.      Noralyn Pick, FNP

## 2020-08-31 ENCOUNTER — Ambulatory Visit: Admit: 2020-08-31 | Discharge: 2020-09-01 | Payer: MEDICARE | Attending: Family | Primary: Family

## 2020-08-31 DIAGNOSIS — I150 Renovascular hypertension: Principal | ICD-10-CM

## 2020-08-31 DIAGNOSIS — Z872 Personal history of diseases of the skin and subcutaneous tissue: Principal | ICD-10-CM

## 2020-08-31 DIAGNOSIS — Z23 Encounter for immunization: Principal | ICD-10-CM

## 2020-08-31 MED ORDER — TRIAMCINOLONE ACETONIDE 0.1 % TOPICAL CREAM
Freq: Two times a day (BID) | TOPICAL | 1 refills | 0 days | Status: CP
Start: 2020-08-31 — End: 2021-08-31

## 2020-09-02 NOTE — Unmapped (Signed)
[facility] Refill Coordination Note    Specialty Medication(s) to be Shipped:   Transplant: Envarsus 1mg  and Myfortic 180mg     Other medication(s) to be shipped: magnesium, omeprazole 20mg , metoprolol succ 25mg  and allopurinol 100mg        [name], DOB: November 11, 1961  Phone: [phone] (home)       All above HIPAA information was verified with patient.     Was a Nurse, learning disability used for this call? Yes, Eli. Patient language is appropriate in [facility]    Completed refill call assessment today to schedule patient's medication shipment from the [facility] [phone]).       Specialty medication(s) and dose(s) confirmed: Regimen is correct and unchanged.   Changes to medications: Rishabh reports no changes at this time.  Changes to insurance: No  Questions for the pharmacist: No    Confirmed patient received Welcome Packet with first shipment. The patient will receive a drug information handout for each medication shipped and additional FDA Medication Guides as required.       DISEASE/MEDICATION-SPECIFIC INFORMATION        N/A    SPECIALTY MEDICATION ADHERENCE     Medication Adherence    Patient reported X missed doses in the last month: 0  Specialty Medication: Myfortic 180mg   Patient is on additional specialty medications: Yes  Additional Specialty Medications: Envarsus 1mg   Patient Reported Additional Medication X Missed Doses in the Last Month: 0  Patient is on more than two specialty medications: No        Myfortic 180 mg: 10 days of medicine on hand   Envarsus 1 mg: 10 days of medicine on hand     SHIPPING     Shipping address confirmed in Epic.     Delivery Scheduled: Yes, Expected medication delivery date: 09/11/2020.     Medication will be delivered via Next Day Courier to the prescription address in Epic WAM.    [name]   [facility] Specialty Technician

## 2020-09-10 MED FILL — OMEPRAZOLE 20 MG CAPSULE,DELAYED RELEASE: ORAL | 30 days supply | Qty: 30 | Fill #7

## 2020-09-10 MED FILL — ENVARSUS XR 1 MG TABLET,EXTENDED RELEASE: 30 days supply | Qty: 60 | Fill #4 | Status: AC

## 2020-09-10 MED FILL — MG-PLUS-PROTEIN 133 MG TABLET: 30 days supply | Qty: 60 | Fill #1 | Status: AC

## 2020-09-10 MED FILL — ALLOPURINOL 100 MG TABLET: ORAL | 30 days supply | Qty: 30 | Fill #2

## 2020-09-10 MED FILL — OMEPRAZOLE 20 MG CAPSULE,DELAYED RELEASE: 30 days supply | Qty: 30 | Fill #7 | Status: AC

## 2020-09-10 MED FILL — METOPROLOL SUCCINATE ER 25 MG TABLET,EXTENDED RELEASE 24 HR: ORAL | 30 days supply | Qty: 60 | Fill #9

## 2020-09-10 MED FILL — ENVARSUS XR 1 MG TABLET,EXTENDED RELEASE: ORAL | 30 days supply | Qty: 60 | Fill #4

## 2020-09-10 MED FILL — ALLOPURINOL 100 MG TABLET: 30 days supply | Qty: 30 | Fill #2 | Status: AC

## 2020-09-10 MED FILL — MG-PLUS-PROTEIN 133 MG TABLET: ORAL | 30 days supply | Qty: 60 | Fill #1

## 2020-09-10 MED FILL — MYFORTIC 180 MG TABLET,DELAYED RELEASE: ORAL | 30 days supply | Qty: 180 | Fill #7

## 2020-09-10 MED FILL — METOPROLOL SUCCINATE ER 25 MG TABLET,EXTENDED RELEASE 24 HR: 30 days supply | Qty: 60 | Fill #9 | Status: AC

## 2020-09-10 MED FILL — MYFORTIC 180 MG TABLET,DELAYED RELEASE: 30 days supply | Qty: 180 | Fill #7 | Status: AC

## 2020-09-15 DIAGNOSIS — Z79899 Other long term (current) drug therapy: Principal | ICD-10-CM

## 2020-09-15 DIAGNOSIS — Z114 Encounter for screening for human immunodeficiency virus [HIV]: Principal | ICD-10-CM

## 2020-09-15 DIAGNOSIS — Z1159 Encounter for screening for other viral diseases: Principal | ICD-10-CM

## 2020-09-15 DIAGNOSIS — Z940 Kidney transplant status: Principal | ICD-10-CM

## 2020-09-16 ENCOUNTER — Ambulatory Visit: Admit: 2020-09-16 | Discharge: 2020-09-17 | Payer: MEDICARE

## 2020-09-16 LAB — PROTEIN UA: Protein:MCnc:Pt:Urine:Qn:Test strip: NEGATIVE

## 2020-09-16 LAB — URINALYSIS
BILIRUBIN UA: NEGATIVE
BLOOD UA: NEGATIVE
GLUCOSE UA: NEGATIVE
KETONES UA: NEGATIVE
LEUKOCYTE ESTERASE UA: NEGATIVE
NITRITE UA: NEGATIVE
PH UA: 7 (ref 5.0–9.0)
PROTEIN UA: NEGATIVE
SPECIFIC GRAVITY UA: 1.02 (ref 1.005–1.040)
SQUAMOUS EPITHELIAL: <1 /HPF (ref 0–5)
UROBILINOGEN UA: 0.2
WBC UA: 3 /HPF — ABNORMAL HIGH (ref <2)

## 2020-09-16 LAB — CBC W/ AUTO DIFF
BASOPHILS ABSOLUTE COUNT: 0.1 10*9/L (ref 0.0–0.1)
BASOPHILS RELATIVE PERCENT: 1.2 %
EOSINOPHILS ABSOLUTE COUNT: 0.5 10*9/L (ref 0.0–0.7)
EOSINOPHILS RELATIVE PERCENT: 10 %
HEMATOCRIT: 44.5 % (ref 38.0–50.0)
HEMOGLOBIN: 14.8 g/dL (ref 13.5–17.5)
LYMPHOCYTES ABSOLUTE COUNT: 0.7 10*9/L (ref 0.7–4.0)
LYMPHOCYTES RELATIVE PERCENT: 14.3 %
MEAN CORPUSCULAR HEMOGLOBIN CONC: 33.2 g/dL (ref 30.0–36.0)
MEAN CORPUSCULAR VOLUME: 92.9 fL (ref 81.0–95.0)
MEAN PLATELET VOLUME: 9.9 fL (ref 7.0–10.0)
MONOCYTES ABSOLUTE COUNT: 0.4 10*9/L (ref 0.1–1.0)
MONOCYTES RELATIVE PERCENT: 8.3 %
NEUTROPHILS ABSOLUTE COUNT: 3.5 10*9/L (ref 1.7–7.7)
NEUTROPHILS RELATIVE PERCENT: 66.2 %
NUCLEATED RED BLOOD CELLS: 0 /100{WBCs} (ref <=4)
PLATELET COUNT: 183 10*9/L (ref 150–450)
RED BLOOD CELL COUNT: 4.79 10*12/L (ref 4.32–5.72)
WBC ADJUSTED: 5.2 10*9/L (ref 3.5–10.5)

## 2020-09-16 LAB — PHOSPHORUS: Phosphate:MCnc:Pt:Ser/Plas:Qn:: 3.3

## 2020-09-16 LAB — BASIC METABOLIC PANEL
ANION GAP: 5 mmol/L (ref 5–14)
BLOOD UREA NITROGEN: 16 mg/dL (ref 9–23)
BUN / CREAT RATIO: 23
CALCIUM: 10.2 mg/dL (ref 8.7–10.4)
CHLORIDE: 107 mmol/L (ref 98–107)
CO2: 27.5 mmol/L (ref 20.0–31.0)
CREATININE: 0.71 mg/dL
EGFR CKD-EPI AA MALE: >90 mL/min/{1.73_m2} (ref >=60)
EGFR CKD-EPI NON-AA MALE: >90 mL/min/{1.73_m2} (ref >=60)
GLUCOSE RANDOM: 125 mg/dL (ref 70–179)
SODIUM: 139 mmol/L (ref 135–145)

## 2020-09-16 LAB — MAGNESIUM: Magnesium:MCnc:Pt:Ser/Plas:Qn:: 2

## 2020-09-16 LAB — CO2: Carbon dioxide:SCnc:Pt:Ser/Plas:Qn:: 27.5

## 2020-09-16 LAB — CREATININE, URINE: Lab: 65

## 2020-09-16 LAB — NUCLEATED RED BLOOD CELLS: Lab: 0

## 2020-09-16 LAB — PROTEIN / CREATININE RATIO, URINE

## 2020-09-17 LAB — TACROLIMUS, TROUGH: Lab: 5.9

## 2020-09-18 ENCOUNTER — Ambulatory Visit: Admit: 2020-09-18 | Discharge: 2020-09-19 | Payer: MEDICARE | Attending: Nephrology | Primary: Nephrology

## 2020-09-18 DIAGNOSIS — Z940 Kidney transplant status: Principal | ICD-10-CM

## 2020-09-18 DIAGNOSIS — Z79899 Other long term (current) drug therapy: Principal | ICD-10-CM

## 2020-09-18 NOTE — Unmapped (Signed)
Left message for patient to return my call.

## 2020-09-18 NOTE — Unmapped (Signed)
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 Pt states he is doing well today. Pt due for RUS, will send message to scheduler. Pt also due for Hgb A1c, added to labs from earlier this week.          09/18/20 0758   BP: 107/67   Pulse: 71   Temp: 36.4 ??C (97.5 ??F)   Weight: 64 kg (141 lb)   PainSc: 0-No pain       Assessment  BP: In September, BP 90/60-104/60s, pt feeling lightheaded, pt decreased Metoprolol to 25mg  nightly, BP improved to 96/63-124/8, denies feeling lightheaded now  BG: 108-125 in AM  Headache: denies  Hand tremors: denies  Numbness/tingling: denies  Fevers: denies  Chills/sweats: denies  Shortness of breath: denies  Chest pain or pressure: denies  Palpitations: denies  Nausea/vomiting: denies  Diarrhea/constipation: denies, occasional gas that causes abdominal pain   UTI symptoms: denies  Swelling: LEFT knee, having swelling around joint  Pain: LEFT knee and LEFT shoulder    Good appetite; reports adequate hydration.     Intake: 2100-2400 ml per day     Any new medications? Pt would like to take Vitamin C daily for diabetes and super joint support/heal-n-soothe supplement for joint pain, will review with Dr. Nestor Lewandowsky.     Immunosuppressant last taken: 7:30am each morning    Immunization status: UTD, pt has received all 3 doses of COVID-19 vaccine and Flu vaccine     Functional Score: 90     Able to carry on normal activity;  Minor signs or symptoms of disease.    I spent a total of 20 minutes with Louis Dennis reviewing medications and symptoms.

## 2020-09-18 NOTE — Unmapped (Signed)
AOBP  #1 115/72-71  #2 112/64-69  #3 16/10-96  Avg 107/67-71

## 2020-09-19 LAB — HEMOGLOBIN A1C: Hemoglobin A1c/Hemoglobin.total:MFr:Pt:Bld:Qn:: 5.5

## 2020-10-06 NOTE — Unmapped (Signed)
[facility] Refill Coordination Note    Specialty Medication(s) to be Shipped:   Transplant: Envarsus 1mg  and Myfortic 180mg     Other medication(s) to be shipped:    magnesium, omeprazole 20mg , metoprolol succ 25mg  and allopurinol 100mg         Louis Dennis, DOB: November 12, 1961  Phone: [phone] (home)       All above HIPAA information was verified with patient.     Was a Nurse, learning disability used for this call? No    Completed refill call assessment today to schedule patient's medication shipment from the [facility] [phone]).       Specialty medication(s) and dose(s) confirmed: Regimen is correct and unchanged.   Changes to medications: Louis Dennis reports no changes at this time.  Changes to insurance: No  Questions for the pharmacist: No    Confirmed patient received Welcome Packet with first shipment. The patient will receive a drug information handout for each medication shipped and additional FDA Medication Guides as required.       DISEASE/MEDICATION-SPECIFIC INFORMATION        N/A    SPECIALTY MEDICATION ADHERENCE                 Myfortic 180 mg: 10 days of medicine on hand   Envarsus 1 mg: 10 days of medicine on hand         SHIPPING     Shipping address confirmed in Epic.     Delivery Scheduled: Yes, Expected medication delivery date: 10/12/20.     Medication will be delivered via Same Day Courier to the prescription address in Epic WAM.    Louis Dennis   [facility] Specialty Technician

## 2020-10-07 ENCOUNTER — Ambulatory Visit: Admit: 2020-10-07 | Discharge: 2020-10-08 | Payer: MEDICARE

## 2020-10-07 DIAGNOSIS — Z79899 Other long term (current) drug therapy: Principal | ICD-10-CM

## 2020-10-07 DIAGNOSIS — Z940 Kidney transplant status: Principal | ICD-10-CM

## 2020-10-08 ENCOUNTER — Ambulatory Visit: Admit: 2020-10-08 | Discharge: 2020-10-09 | Payer: MEDICARE

## 2020-10-08 DIAGNOSIS — Z79899 Other long term (current) drug therapy: Principal | ICD-10-CM

## 2020-10-08 DIAGNOSIS — Z940 Kidney transplant status: Principal | ICD-10-CM

## 2020-10-08 LAB — CBC W/ AUTO DIFF
BASOPHILS RELATIVE PERCENT: 0.9 %
EOSINOPHILS RELATIVE PERCENT: 10.6 %
HEMOGLOBIN: 14.3 g/dL (ref 13.5–17.5)
LYMPHOCYTES ABSOLUTE COUNT: 0.6 10*9/L — ABNORMAL LOW (ref 0.7–4.0)
LYMPHOCYTES RELATIVE PERCENT: 10.9 %
MEAN CORPUSCULAR HEMOGLOBIN CONC: 32.3 g/dL (ref 30.0–36.0)
MEAN CORPUSCULAR HEMOGLOBIN: 30.2 pg (ref 26.0–34.0)
MEAN CORPUSCULAR VOLUME: 93.5 fL (ref 81.0–95.0)
MEAN PLATELET VOLUME: 8.8 fL (ref 7.0–10.0)
MONOCYTES ABSOLUTE COUNT: 0.4 10*9/L (ref 0.1–1.0)
MONOCYTES RELATIVE PERCENT: 7.6 %
NEUTROPHILS ABSOLUTE COUNT: 3.7 10*9/L (ref 1.7–7.7)
NEUTROPHILS RELATIVE PERCENT: 70 %
NUCLEATED RED BLOOD CELLS: 0 /100{WBCs} (ref <=4)
PLATELET COUNT: 182 10*9/L (ref 150–450)
RED BLOOD CELL COUNT: 4.74 10*12/L (ref 4.32–5.72)
RED CELL DISTRIBUTION WIDTH: 15.1 % — ABNORMAL HIGH (ref 12.0–15.0)
WBC ADJUSTED: 5.3 10*9/L (ref 3.5–10.5)

## 2020-10-08 LAB — BASIC METABOLIC PANEL
ANION GAP: 5 mmol/L (ref 5–14)
BUN / CREAT RATIO: 27
CALCIUM: 9.8 mg/dL (ref 8.7–10.4)
CHLORIDE: 105 mmol/L (ref 98–107)
CO2: 28.6 mmol/L (ref 20.0–31.0)
CREATININE: 0.67 mg/dL — ABNORMAL LOW
EGFR CKD-EPI AA MALE: >90 mL/min/{1.73_m2} (ref >=60)
EGFR CKD-EPI NON-AA MALE: >90 mL/min/{1.73_m2} (ref >=60)
GLUCOSE RANDOM: 127 mg/dL (ref 70–179)
POTASSIUM: 4.1 mmol/L (ref 3.4–4.5)
SODIUM: 139 mmol/L (ref 135–145)

## 2020-10-08 LAB — PHOSPHORUS: Phosphate:MCnc:Pt:Ser/Plas:Qn:: 3.1

## 2020-10-08 LAB — HEMATOCRIT: Hematocrit:VFr:Pt:Bld:Qn:: 44.3

## 2020-10-08 LAB — MAGNESIUM: Magnesium:MCnc:Pt:Ser/Plas:Qn:: 2

## 2020-10-08 LAB — CO2: Carbon dioxide:SCnc:Pt:Ser/Plas:Qn:: 28.6

## 2020-10-09 LAB — TACROLIMUS, TROUGH: Lab: 5.5

## 2020-10-12 MED FILL — METOPROLOL SUCCINATE ER 25 MG TABLET,EXTENDED RELEASE 24 HR: ORAL | 30 days supply | Qty: 60 | Fill #10

## 2020-10-12 MED FILL — ALLOPURINOL 100 MG TABLET: ORAL | 30 days supply | Qty: 30 | Fill #3

## 2020-10-12 MED FILL — ENVARSUS XR 1 MG TABLET,EXTENDED RELEASE: 30 days supply | Qty: 60 | Fill #5 | Status: AC

## 2020-10-12 MED FILL — ALLOPURINOL 100 MG TABLET: 30 days supply | Qty: 30 | Fill #3 | Status: AC

## 2020-10-12 MED FILL — OMEPRAZOLE 20 MG CAPSULE,DELAYED RELEASE: ORAL | 30 days supply | Qty: 30 | Fill #8

## 2020-10-12 MED FILL — MG-PLUS-PROTEIN 133 MG TABLET: ORAL | 30 days supply | Qty: 60 | Fill #2

## 2020-10-12 MED FILL — MYFORTIC 180 MG TABLET,DELAYED RELEASE: ORAL | 30 days supply | Qty: 180 | Fill #8

## 2020-10-12 MED FILL — MG-PLUS-PROTEIN 133 MG TABLET: 30 days supply | Qty: 60 | Fill #2 | Status: AC

## 2020-10-12 MED FILL — MYFORTIC 180 MG TABLET,DELAYED RELEASE: 30 days supply | Qty: 180 | Fill #8 | Status: AC

## 2020-10-12 MED FILL — METOPROLOL SUCCINATE ER 25 MG TABLET,EXTENDED RELEASE 24 HR: 30 days supply | Qty: 60 | Fill #10 | Status: AC

## 2020-10-12 MED FILL — ENVARSUS XR 1 MG TABLET,EXTENDED RELEASE: ORAL | 30 days supply | Qty: 60 | Fill #5

## 2020-10-12 MED FILL — OMEPRAZOLE 20 MG CAPSULE,DELAYED RELEASE: 30 days supply | Qty: 30 | Fill #8 | Status: AC

## 2020-11-05 ENCOUNTER — Ambulatory Visit: Admit: 2020-11-05 | Discharge: 2020-11-06 | Payer: MEDICARE

## 2020-11-05 DIAGNOSIS — Z940 Kidney transplant status: Principal | ICD-10-CM

## 2020-11-05 DIAGNOSIS — Z79899 Other long term (current) drug therapy: Principal | ICD-10-CM

## 2020-11-05 LAB — BASIC METABOLIC PANEL
ANION GAP: 7 mmol/L (ref 5–14)
BLOOD UREA NITROGEN: 18 mg/dL (ref 9–23)
BUN / CREAT RATIO: 28
CALCIUM: 9.6 mg/dL (ref 8.7–10.4)
CHLORIDE: 104 mmol/L (ref 98–107)
CO2: 25.9 mmol/L (ref 20.0–31.0)
CREATININE: 0.64 mg/dL
EGFR CKD-EPI AA MALE: >90 mL/min/{1.73_m2} (ref >=60)
EGFR CKD-EPI NON-AA MALE: >90 mL/min/{1.73_m2} (ref >=60)
GLUCOSE RANDOM: 131 mg/dL (ref 70–179)
POTASSIUM: 4.1 mmol/L (ref 3.4–4.5)
SODIUM: 137 mmol/L (ref 135–145)

## 2020-11-05 LAB — CBC W/ AUTO DIFF
BASOPHILS ABSOLUTE COUNT: 0 10*9/L (ref 0.0–0.1)
BASOPHILS RELATIVE PERCENT: 0.8 %
EOSINOPHILS ABSOLUTE COUNT: 0.5 10*9/L (ref 0.0–0.7)
EOSINOPHILS RELATIVE PERCENT: 9.7 %
HEMATOCRIT: 45.4 % (ref 38.0–50.0)
HEMOGLOBIN: 14.7 g/dL (ref 13.5–17.5)
LYMPHOCYTES ABSOLUTE COUNT: 0.6 10*9/L — ABNORMAL LOW (ref 0.7–4.0)
LYMPHOCYTES RELATIVE PERCENT: 12.1 %
MEAN CORPUSCULAR HEMOGLOBIN CONC: 32.3 g/dL (ref 30.0–36.0)
MEAN CORPUSCULAR HEMOGLOBIN: 30.3 pg (ref 26.0–34.0)
MEAN CORPUSCULAR VOLUME: 93.7 fL (ref 81.0–95.0)
MEAN PLATELET VOLUME: 9.9 fL (ref 7.0–10.0)
MONOCYTES ABSOLUTE COUNT: 0.4 10*9/L (ref 0.1–1.0)
MONOCYTES RELATIVE PERCENT: 8.9 %
NEUTROPHILS ABSOLUTE COUNT: 3.4 10*9/L (ref 1.7–7.7)
NEUTROPHILS RELATIVE PERCENT: 68.5 %
NUCLEATED RED BLOOD CELLS: 0 /100{WBCs} (ref <=4)
PLATELET COUNT: 183 10*9/L (ref 150–450)
RED BLOOD CELL COUNT: 4.84 10*12/L (ref 4.32–5.72)
RED CELL DISTRIBUTION WIDTH: 14.5 % (ref 12.0–15.0)
WBC ADJUSTED: 4.9 10*9/L (ref 3.5–10.5)

## 2020-11-05 LAB — MAGNESIUM: MAGNESIUM: 2 mg/dL (ref 1.6–2.6)

## 2020-11-05 LAB — PHOSPHORUS: PHOSPHORUS: 3 mg/dL (ref 2.4–5.1)

## 2020-11-05 LAB — TACROLIMUS LEVEL, TROUGH: TACROLIMUS, TROUGH: 4.6 ng/mL — ABNORMAL LOW (ref 5.0–15.0)

## 2020-11-06 DIAGNOSIS — Z940 Kidney transplant status: Principal | ICD-10-CM

## 2020-11-06 MED ORDER — TACROLIMUS XR 1 MG TABLET,EXTENDED RELEASE 24 HR
ORAL_TABLET | Freq: Every day | ORAL | 11 refills | 30.00000 days | Status: CP
Start: 2020-11-06 — End: 2020-11-24
  Filled 2020-11-10: qty 90, 30d supply, fill #0

## 2020-11-06 NOTE — Unmapped (Signed)
Reviewed labs with Dr Nestor Lewandowsky. Increase envarsus to 3mg  daily    Called and updated patient who verbalized understanding

## 2020-11-09 NOTE — Unmapped (Signed)
[facility] Refill Coordination Note    Specialty Medication(s) to be Shipped:   Transplant: Envarsus 1mg  and Myfortic 180mg     Other medication(s) to be shipped: allopurinol, magnesium, metoprolol, omeprazole     Louis Dennis, DOB: [birth year]  Phone: [phone] (home)       All above HIPAA information was verified with patient.     Was a Nurse, learning disability used for this call? No    Completed refill call assessment today to schedule patient's medication shipment from the [facility] [phone]).       Specialty medication(s) and dose(s) confirmed: Patient reports changes to the regimen as follows: Increase in Envarsus dose   Changes to medications: Louis Dennis reports no changes at this time.  Changes to insurance: No  Questions for the pharmacist: No    Confirmed patient received Welcome Packet with first shipment. The patient will receive a drug information handout for each medication shipped and additional FDA Medication Guides as required.       DISEASE/MEDICATION-SPECIFIC INFORMATION        N/A    SPECIALTY MEDICATION ADHERENCE     Medication Adherence    Patient reported X missed doses in the last month: 0  Specialty Medication: Envarsus  Patient is on additional specialty medications: Yes  Additional Specialty Medications: Myfortic  Patient Reported Additional Medication X Missed Doses in the Last Month: 0  Patient is on more than two specialty medications: No  Any gaps in refill history greater than 2 weeks in the last 3 months: no  Demonstrates understanding of importance of adherence: yes  Informant: patient                Envarsus 1mg : Patient has 2 days of medication on hand    Myfortic 180mg : Patient has 10 days of medication on hand      SHIPPING     Shipping address confirmed in Epic.     Delivery Scheduled: Yes, Expected medication delivery date: 12/8.     Medication will be delivered via Next Day Courier to the prescription address in Epic WAM.    Louis Dennis   [facility] Specialty Technician

## 2020-11-10 MED FILL — MYFORTIC 180 MG TABLET,DELAYED RELEASE: 30 days supply | Qty: 180 | Fill #9 | Status: AC

## 2020-11-10 MED FILL — ALLOPURINOL 100 MG TABLET: ORAL | 30 days supply | Qty: 30 | Fill #4

## 2020-11-10 MED FILL — MG-PLUS-PROTEIN 133 MG TABLET: 30 days supply | Qty: 60 | Fill #3 | Status: AC

## 2020-11-10 MED FILL — OMEPRAZOLE 20 MG CAPSULE,DELAYED RELEASE: ORAL | 30 days supply | Qty: 30 | Fill #9

## 2020-11-10 MED FILL — MG-PLUS-PROTEIN 133 MG TABLET: ORAL | 30 days supply | Qty: 60 | Fill #3

## 2020-11-10 MED FILL — OMEPRAZOLE 20 MG CAPSULE,DELAYED RELEASE: 30 days supply | Qty: 30 | Fill #9 | Status: AC

## 2020-11-10 MED FILL — ENVARSUS XR 1 MG TABLET,EXTENDED RELEASE: 30 days supply | Qty: 90 | Fill #0 | Status: AC

## 2020-11-10 MED FILL — METOPROLOL SUCCINATE ER 25 MG TABLET,EXTENDED RELEASE 24 HR: 30 days supply | Qty: 60 | Fill #11 | Status: AC

## 2020-11-10 MED FILL — ALLOPURINOL 100 MG TABLET: 30 days supply | Qty: 30 | Fill #4 | Status: AC

## 2020-11-10 MED FILL — MYFORTIC 180 MG TABLET,DELAYED RELEASE: ORAL | 30 days supply | Qty: 180 | Fill #9

## 2020-11-10 MED FILL — METOPROLOL SUCCINATE ER 25 MG TABLET,EXTENDED RELEASE 24 HR: ORAL | 30 days supply | Qty: 60 | Fill #11

## 2020-11-20 ENCOUNTER — Ambulatory Visit: Admit: 2020-11-20 | Discharge: 2020-11-21 | Payer: MEDICARE

## 2020-11-20 DIAGNOSIS — Z79899 Other long term (current) drug therapy: Principal | ICD-10-CM

## 2020-11-20 DIAGNOSIS — Z940 Kidney transplant status: Principal | ICD-10-CM

## 2020-11-20 LAB — BASIC METABOLIC PANEL
ANION GAP: 7 mmol/L (ref 5–14)
BLOOD UREA NITROGEN: 15 mg/dL (ref 9–23)
BUN / CREAT RATIO: 22
CALCIUM: 9.8 mg/dL (ref 8.7–10.4)
CHLORIDE: 106 mmol/L (ref 98–107)
CO2: 25.9 mmol/L (ref 20.0–31.0)
CREATININE: 0.67 mg/dL
EGFR CKD-EPI AA MALE: >90 mL/min/{1.73_m2} (ref >=60)
EGFR CKD-EPI NON-AA MALE: >90 mL/min/{1.73_m2} (ref >=60)
GLUCOSE RANDOM: 127 mg/dL (ref 70–179)
POTASSIUM: 4.5 mmol/L (ref 3.4–4.5)
SODIUM: 139 mmol/L (ref 135–145)

## 2020-11-20 LAB — CBC W/ AUTO DIFF
BASOPHILS ABSOLUTE COUNT: 0.1 10*9/L (ref 0.0–0.1)
BASOPHILS RELATIVE PERCENT: 1.5 %
EOSINOPHILS ABSOLUTE COUNT: 0.3 10*9/L (ref 0.0–0.7)
EOSINOPHILS RELATIVE PERCENT: 7.1 %
HEMATOCRIT: 45.1 % (ref 38.0–50.0)
HEMOGLOBIN: 14.8 g/dL (ref 13.5–17.5)
LYMPHOCYTES ABSOLUTE COUNT: 0.6 10*9/L — ABNORMAL LOW (ref 0.7–4.0)
LYMPHOCYTES RELATIVE PERCENT: 13 %
MEAN CORPUSCULAR HEMOGLOBIN CONC: 32.8 g/dL (ref 30.0–36.0)
MEAN CORPUSCULAR HEMOGLOBIN: 30.7 pg (ref 26.0–34.0)
MEAN CORPUSCULAR VOLUME: 93.7 fL (ref 81.0–95.0)
MEAN PLATELET VOLUME: 8.9 fL (ref 7.0–10.0)
MONOCYTES ABSOLUTE COUNT: 0.4 10*9/L (ref 0.1–1.0)
MONOCYTES RELATIVE PERCENT: 8.8 %
NEUTROPHILS ABSOLUTE COUNT: 3.2 10*9/L (ref 1.7–7.7)
NEUTROPHILS RELATIVE PERCENT: 69.6 %
NUCLEATED RED BLOOD CELLS: 0 /100{WBCs} (ref <=4)
PLATELET COUNT: 182 10*9/L (ref 150–450)
RED BLOOD CELL COUNT: 4.81 10*12/L (ref 4.32–5.72)
RED CELL DISTRIBUTION WIDTH: 14.5 % (ref 12.0–15.0)
WBC ADJUSTED: 4.7 10*9/L (ref 3.5–10.5)

## 2020-11-20 LAB — MAGNESIUM: MAGNESIUM: 1.9 mg/dL (ref 1.6–2.6)

## 2020-11-20 LAB — PHOSPHORUS: PHOSPHORUS: 3 mg/dL (ref 2.4–5.1)

## 2020-11-20 MED ORDER — METFORMIN ER 500 MG TABLET,EXTENDED RELEASE 24 HR
ORAL_TABLET | Freq: Every day | ORAL | 3 refills | 90 days | Status: CP
Start: 2020-11-20 — End: 2021-11-20

## 2020-11-21 LAB — TACROLIMUS LEVEL, TROUGH: TACROLIMUS, TROUGH: 10.9 ng/mL (ref 5.0–15.0)

## 2020-11-24 DIAGNOSIS — Z940 Kidney transplant status: Principal | ICD-10-CM

## 2020-11-24 MED ORDER — TACROLIMUS XR 1 MG TABLET,EXTENDED RELEASE 24 HR
ORAL_TABLET | Freq: Every day | ORAL | 11 refills | 30 days | Status: CP
Start: 2020-11-24 — End: ?
  Filled 2020-11-26: qty 60, 30d supply, fill #0

## 2020-11-24 MED ORDER — TACROLIMUS XR 0.75 MG TABLET,EXTENDED RELEASE 24 HR
ORAL_TABLET | Freq: Every day | ORAL | 11 refills | 30 days | Status: CP
Start: 2020-11-24 — End: ?

## 2020-11-24 NOTE — Unmapped (Signed)
Reviewed tac dose with dr Nestor Lewandowsky.  Decrease envarsus to 2.5mg  daily    Called and updated patient on labs and need to start a lower dose. Verbalized that he will take 2 0.75mg  tablets with 1 1mg  tablet every AM    Denies any other needs at this time

## 2020-11-24 NOTE — Unmapped (Signed)
Clinical Assessment Needed For: Dose Change  Medication: ENVARSUS XR 0.75MG  AND XR 1MG   Last Fill Date/Day Supply: 11/10/2020 / 30 DAYS (1MG  ONLY)  Copay $0  Was previous dose already scheduled to fill: No    Notes to Pharmacist: Decrease from 3mg  daily

## 2020-11-25 NOTE — Unmapped (Signed)
[facility] Clinical Assessment & Refill Coordination Note    Louis Dennis, [place]: 01/28/1961  Phone: [phone] (home)     All above HIPAA information was verified with patient.     Was a Nurse, learning disability used for this call? No    Specialty Medication(s):   Transplant: Envarsus 1mg , Envarsus 0.75mg  and Myfortic 180mg      Current Outpatient Medications   Medication Sig Dispense Refill   ??? allopurinoL (ZYLOPRIM) 100 MG tablet Take 1 tablet (100 mg total) by mouth daily. 30 tablet 11   ??? biotin 5 mg cap Take 1 capsule (5,000 mcg total) by mouth daily.  0   ??? magnesium oxide-Mg AA chelate (MAGNESIUM, AMINO ACID CHELATE,) 133 mg Tab Take 1 tablet by mouth Two (2) times a day. 60 tablet 11   ??? metFORMIN (GLUCOPHAGE-XR) 500 MG 24 hr tablet TAKE 1 TABLET (500 MG TOTAL) BY MOUTH DAILY WITH EVENING MEAL. 90 tablet 3   ??? metoprolol succinate (TOPROL XL) 25 MG 24 hr tablet Take 2 tablets (50 mg total) by mouth nightly. 60 tablet 11   ??? MYFORTIC 180 mg EC tablet Take 3 tablets (540 mg total) by mouth Two (2) times a day. 180 tablet 11   ??? omeprazole (PRILOSEC) 20 MG capsule Take 1 capsule (20 mg total) by mouth daily. 30 capsule 11   ??? tacrolimus (ENVARSUS XR) 0.75 mg Tb24 extended release tablet Take two (0.75mg ) tablets with one 1mg  tablet every morning to equal 2.5mg  daily dose 60 tablet 11   ??? tacrolimus (ENVARSUS XR) 1 mg Tb24 extended release tablet Take one (1mg ) tablet with two (0.75mg ) tablets every morning to equal 2.5mg  daily dose 30 tablet 11   ??? triamcinolone (KENALOG) 0.1 % cream Apply topically Two (2) times a day. 80 g 1     No current facility-administered medications for this visit.        Changes to medications: Louis Dennis reports no changes at this time.    No Known Allergies    Changes to allergies: No    SPECIALTY MEDICATION ADHERENCE     Envarsus Xr 1 mg: 15 days of medicine on hand   Envarsus Xr 0.75 mg: 0 days of medicine on hand   Myfortic 180 mg: 15 days of medicine on hand Medication Adherence    Patient reported X missed doses in the last month: 0  Specialty Medication: Myfortic 180mg   Patient is on additional specialty medications: Yes  Additional Specialty Medications: Envarsus Xr 0.75mg   Patient Reported Additional Medication X Missed Doses in the Last Month: 0  Patient is on more than two specialty medications: Yes  Specialty Medication: Envarsus Xr 1mg   Patient Reported Additional Medication X Missed Doses in the Last Month: 0          Specialty medication(s) dose(s) confirmed: Regimen is correct and unchanged.     Are there any concerns with adherence? No    Adherence counseling provided? Not needed    CLINICAL MANAGEMENT AND INTERVENTION      Clinical Benefit Assessment:    Do you feel the medicine is effective or helping your condition? Yes    Clinical Benefit counseling provided? Not needed    Adverse Effects Assessment:    Are you experiencing any side effects? No    Are you experiencing difficulty administering your medicine? No    Quality of Life Assessment:    How many days over the past month did your kidney transplant  keep you from  your normal activities? For example, brushing your teeth or getting up in the morning. 0    Have you discussed this with your provider? Not needed    Therapy Appropriateness:    Is therapy appropriate? Yes, therapy is appropriate and should be continued    DISEASE/MEDICATION-SPECIFIC INFORMATION      N/A    PATIENT SPECIFIC NEEDS     - Does the patient have any physical, cognitive, or cultural barriers? No    - Is the patient high risk? Yes, patient is taking a REMS drug. Medication is dispensed in compliance with REMS program    - Does the patient require a Care Management Plan? No     - Does the patient require physician intervention or other additional services (i.e. nutrition, smoking cessation, social work)? No      SHIPPING     Specialty Medication(s) to be Shipped:   Transplant: Envarsus 0.75mg     Other medication(s) to be shipped: No additional medications requested for fill at this time     Changes to insurance: No    Delivery Scheduled: Yes, Expected medication delivery date: 11/26/20.     Medication will be delivered via Same Day Courier to the confirmed prescription address in [facility].    The patient will receive a drug information handout for each medication shipped and additional FDA Medication Guides as required.  Verified that patient has previously received a Conservation officer, historic buildings.    All of the patient's questions and concerns have been addressed.    Tera Helper   [facility] Specialty Pharmacist

## 2020-11-26 MED ORDER — METOPROLOL SUCCINATE ER 25 MG TABLET,EXTENDED RELEASE 24 HR
ORAL_TABLET | Freq: Every evening | ORAL | 11 refills | 30 days | Status: CP
Start: 2020-11-26 — End: 2021-11-26
  Filled 2020-12-09: qty 60, 30d supply, fill #0

## 2020-11-26 MED FILL — ENVARSUS XR 0.75 MG TABLET,EXTENDED RELEASE: 30 days supply | Qty: 60 | Fill #0 | Status: AC

## 2020-11-26 NOTE — Unmapped (Signed)
[facility] Refill Coordination Note    Specialty Medication(s) to be Shipped:   Transplant: Envarsus 1mg  and Myfortic 180mg     Other medication(s) to be shipped: magnesium, omeprazole, metoprolol and envarsus     Louis Dennis, DOB: [birth year]  Phone: [phone] (home)       All above HIPAA information was verified with patient.     Was a Nurse, learning disability used for this call? Yes, [name]. Patient language is appropriate in [facility]    Completed refill call assessment today to schedule patient's medication shipment from the [facility] [phone]).       Specialty medication(s) and dose(s) confirmed: Patient reports changes to the regimen as follows: Envarsus - Take one (1mg ) tablet with two (0.75mg ) tablets every morning to equal 2.5mg  daily dose **new rx is on profile**   Changes to medications: Louis Dennis reports no changes at this time.  Changes to insurance: No  Questions for the pharmacist: No    Confirmed patient received Welcome Packet with first shipment. The patient will receive a drug information handout for each medication shipped and additional FDA Medication Guides as required.       DISEASE/MEDICATION-SPECIFIC INFORMATION        N/A    SPECIALTY MEDICATION ADHERENCE     Medication Adherence    Patient reported X missed doses in the last month: 0  Specialty Medication: Myfortic 180mg   Patient is on additional specialty medications: Yes  Additional Specialty Medications: Envarsus 1mg   Patient Reported Additional Medication X Missed Doses in the Last Month: 0  Patient is on more than two specialty medications: No       Myfortic 180 mg: 14 days of medicine on hand   Envarsus 1 mg: 14 days of medicine on hand     SHIPPING     Shipping address confirmed in Epic.     Delivery Scheduled: Yes, Expected medication delivery date: 12/11/2019.     Medication will be delivered via Next Day Courier to the prescription address in Epic W[facility].    [name]   [facility] Specialty Technician

## 2020-12-08 ENCOUNTER — Ambulatory Visit: Admit: 2020-12-08 | Discharge: 2020-12-09 | Payer: MEDICARE

## 2020-12-08 DIAGNOSIS — Z79899 Other long term (current) drug therapy: Principal | ICD-10-CM

## 2020-12-08 DIAGNOSIS — Z940 Kidney transplant status: Principal | ICD-10-CM

## 2020-12-08 LAB — CBC W/ AUTO DIFF
BASOPHILS ABSOLUTE COUNT: 0.1 10*9/L (ref 0.0–0.1)
BASOPHILS RELATIVE PERCENT: 1.2 %
EOSINOPHILS ABSOLUTE COUNT: 0.4 10*9/L (ref 0.0–0.7)
EOSINOPHILS RELATIVE PERCENT: 8.2 %
HEMATOCRIT: 44.7 % (ref 38.0–50.0)
HEMOGLOBIN: 14.8 g/dL (ref 13.5–17.5)
LYMPHOCYTES ABSOLUTE COUNT: 0.6 10*9/L — ABNORMAL LOW (ref 0.7–4.0)
LYMPHOCYTES RELATIVE PERCENT: 12.3 %
MEAN CORPUSCULAR HEMOGLOBIN CONC: 33 g/dL (ref 30.0–36.0)
MEAN CORPUSCULAR HEMOGLOBIN: 30.6 pg (ref 26.0–34.0)
MEAN CORPUSCULAR VOLUME: 92.7 fL (ref 81.0–95.0)
MEAN PLATELET VOLUME: 8.8 fL (ref 7.0–10.0)
MONOCYTES ABSOLUTE COUNT: 0.4 10*9/L (ref 0.1–1.0)
MONOCYTES RELATIVE PERCENT: 8.5 %
NEUTROPHILS ABSOLUTE COUNT: 3.3 10*9/L (ref 1.7–7.7)
NEUTROPHILS RELATIVE PERCENT: 69.8 %
NUCLEATED RED BLOOD CELLS: 0 /100{WBCs} (ref <=4)
PLATELET COUNT: 175 10*9/L (ref 150–450)
RED BLOOD CELL COUNT: 4.82 10*12/L (ref 4.32–5.72)
RED CELL DISTRIBUTION WIDTH: 14.5 % (ref 12.0–15.0)
WBC ADJUSTED: 4.8 10*9/L (ref 3.5–10.5)

## 2020-12-08 LAB — BASIC METABOLIC PANEL
ANION GAP: 7 mmol/L (ref 5–14)
BLOOD UREA NITROGEN: 15 mg/dL (ref 9–23)
BUN / CREAT RATIO: 24
CALCIUM: 9.9 mg/dL (ref 8.7–10.4)
CHLORIDE: 104 mmol/L (ref 98–107)
CO2: 26.9 mmol/L (ref 20.0–31.0)
CREATININE: 0.63 mg/dL
EGFR CKD-EPI AA MALE: >90 mL/min/{1.73_m2} (ref >=60)
EGFR CKD-EPI NON-AA MALE: >90 mL/min/{1.73_m2} (ref >=60)
GLUCOSE RANDOM: 132 mg/dL (ref 70–179)
POTASSIUM: 4 mmol/L (ref 3.4–4.5)
SODIUM: 138 mmol/L (ref 135–145)

## 2020-12-08 LAB — PHOSPHORUS: PHOSPHORUS: 3.3 mg/dL (ref 2.4–5.1)

## 2020-12-08 LAB — MAGNESIUM: MAGNESIUM: 1.8 mg/dL (ref 1.6–2.6)

## 2020-12-09 ENCOUNTER — Ambulatory Visit: Admit: 2020-12-09 | Discharge: 2020-12-10 | Payer: MEDICARE | Attending: Nephrology | Primary: Nephrology

## 2020-12-09 DIAGNOSIS — Z940 Kidney transplant status: Principal | ICD-10-CM

## 2020-12-09 DIAGNOSIS — M25512 Pain in left shoulder: Principal | ICD-10-CM

## 2020-12-09 DIAGNOSIS — G8929 Other chronic pain: Principal | ICD-10-CM

## 2020-12-09 DIAGNOSIS — Z79899 Other long term (current) drug therapy: Principal | ICD-10-CM

## 2020-12-09 LAB — URINALYSIS
BACTERIA: NONE SEEN /HPF
BILIRUBIN UA: NEGATIVE
BLOOD UA: NEGATIVE
GLUCOSE UA: NEGATIVE
KETONES UA: NEGATIVE
LEUKOCYTE ESTERASE UA: NEGATIVE
NITRITE UA: NEGATIVE
PH UA: 7 (ref 5.0–9.0)
PROTEIN UA: NEGATIVE
RBC UA: <1 /HPF (ref <3)
SPECIFIC GRAVITY UA: 1.015 (ref 1.005–1.030)
SQUAMOUS EPITHELIAL: 1 /HPF (ref 0–5)
UROBILINOGEN UA: 0.2
WBC UA: 1 /HPF (ref <2)

## 2020-12-09 LAB — PROTEIN / CREATININE RATIO, URINE
CREATININE, URINE: 69.2 mg/dL
PROTEIN URINE: 6.4 mg/dL
PROTEIN/CREAT RATIO, URINE: 0.092

## 2020-12-09 LAB — TACROLIMUS LEVEL, TROUGH: TACROLIMUS, TROUGH: 8.8 ng/mL (ref 5.0–15.0)

## 2020-12-09 MED FILL — OMEPRAZOLE 20 MG CAPSULE,DELAYED RELEASE: ORAL | 30 days supply | Qty: 30 | Fill #10

## 2020-12-09 MED FILL — ALLOPURINOL 100 MG TABLET: ORAL | 30 days supply | Qty: 30 | Fill #5

## 2020-12-09 MED FILL — ENVARSUS XR 1 MG TABLET,EXTENDED RELEASE: 30 days supply | Qty: 30 | Fill #0 | Status: AC

## 2020-12-09 MED FILL — MYFORTIC 180 MG TABLET,DELAYED RELEASE: ORAL | 30 days supply | Qty: 180 | Fill #10

## 2020-12-09 MED FILL — METOPROLOL SUCCINATE ER 25 MG TABLET,EXTENDED RELEASE 24 HR: 30 days supply | Qty: 60 | Fill #0 | Status: AC

## 2020-12-09 MED FILL — MYFORTIC 180 MG TABLET,DELAYED RELEASE: 30 days supply | Qty: 180 | Fill #10 | Status: AC

## 2020-12-09 MED FILL — MG-PLUS-PROTEIN 133 MG TABLET: 30 days supply | Qty: 60 | Fill #4 | Status: AC

## 2020-12-09 MED FILL — ENVARSUS XR 1 MG TABLET,EXTENDED RELEASE: ORAL | 30 days supply | Qty: 30 | Fill #0

## 2020-12-09 MED FILL — MG-PLUS-PROTEIN 133 MG TABLET: ORAL | 30 days supply | Qty: 60 | Fill #4

## 2020-12-09 MED FILL — ALLOPURINOL 100 MG TABLET: 30 days supply | Qty: 30 | Fill #5 | Status: AC

## 2020-12-09 MED FILL — OMEPRAZOLE 20 MG CAPSULE,DELAYED RELEASE: 30 days supply | Qty: 30 | Fill #10 | Status: AC

## 2020-12-09 NOTE — Unmapped (Signed)
Transplant Nephrology Clinic Visit    History of Present Illness  Louis Dennis is a 60 y.o. 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  -Adequate perfusion of the transplant kidney. Resistive indices within the mid main renal artery and at the anastomosis are slightly elevated. Attention on follow-up.  ??  Current Immunosuppression Regimen:   - Myfortic 540mg  BID  - Envarsus 2.5 mg every day    Subjective/Interval:     Since patient's last visit in the transplant clinic - patient has been doing well in terms of transplant, taking transplant medications regularly, no episodes of rejection and no side effects of medications.    Patient presents today feeling overall well. He continues to endorse right shoulder pain and states he has not received a call to schedule an appointment with PT. Home BP is well-controlled. Denies chest pain or tightness, nausea, vomiting, fevers, chills, dysuria, or lower extremity swelling.     Review of Systems  Otherwise as per HPI, all other systems reviewed and are negative.    Medications  Current Outpatient Medications   Medication Sig Dispense Refill   ??? allopurinoL (ZYLOPRIM) 100 MG tablet Take 1 tablet (100 mg total) by mouth daily. 30 tablet 11   ??? biotin 5 mg cap Take 1 capsule (5,000 mcg total) by mouth daily.  0   ??? magnesium oxide-Mg AA chelate (MAGNESIUM, AMINO ACID CHELATE,) 133 mg Tab Take 1 tablet by mouth Two (2) times a day. 60 tablet 11   ??? metFORMIN (GLUCOPHAGE-XR) 500 MG 24 hr tablet TAKE 1 TABLET (500 MG TOTAL) BY MOUTH DAILY WITH EVENING MEAL. 90 tablet 3   ??? metoprolol succinate (TOPROL XL) 25 MG 24 hr tablet Take 2 tablets (50 mg total) by mouth nightly. 60 tablet 11   ??? MYFORTIC 180 mg EC tablet Take 3 tablets (540 mg total) by mouth Two (2) times a day. 180 tablet 11   ??? omeprazole (PRILOSEC) 20 MG capsule Take 1 capsule (20 mg total) by mouth daily. 30 capsule 11   ??? tacrolimus (ENVARSUS XR) 0.75 mg Tb24 extended release tablet Take two (0.75mg ) tablets with one 1mg  tablet every morning to equal 2.5mg  daily dose 60 tablet 11   ??? tacrolimus (ENVARSUS XR) 1 mg Tb24 extended release tablet Take one (1mg ) tablet with two (0.75mg ) tablets every morning to equal 2.5mg  daily dose 30 tablet 11   ??? triamcinolone (KENALOG) 0.1 % cream Apply topically Two (2) times a day. 80 g 1     No current facility-administered medications for this visit.       Physical Exam  BP 111/74 (BP Site: R Arm, BP Position:  Sitting, BP Cuff Size: Medium)  - Pulse 84  - Temp 36.6 ??C (97.8 ??F) (Temporal)   General: Well appearing male, no acute distress  HEENT: wearing mask, EOMI, sclera anicteric;   Neck: wearing mask  CV: normal rate, normal rhythm, no murmur, no rubs appreciated  Lungs: clear to auscultation bilaterally, normal work of breathing.  Abdomen: soft, non tender.  Extremities: no edema  Musculoskeletal: normal range of motion left shoulder, decreased ROM right shoulder.  Pulses: intact distally throughout  Neurologic: awake, alert, and oriented x3    Laboratory Data and Imaging reviewed in EPIC    Assessment: [name] is a 60 y.o. 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     Recommendations/Plan:   Allograft Function: renal function stable at 0.63 with a baseline of approximately 0.7-0.9 since transplant. Will continue to monitor.    Immunosuppression Management [High Risk Medical Decision Making For Drug Therapy Requiring Intensive Monitoring For Toxicity]:  Continue Envarsus 2.5 mg daily. Targeting tacrolimus trough level of approximately 6-8. Slightly high last 2 - dose was adjusted. Continue Myfortic 540 mg BID. Tacrolimus trough 10.9 ng/mL (11/20/20)    Blood Pressure Management: BP 111/74 at this visit. Patient currently on Toprol-XL 25 mg at night.    Pre-diabetes: Currently on metformin 500 mg daily. Home BG overall controlled. Last A1c was 5.5 in 09/2020.Will monitor.    Hypercalcemia: Calcium 9.9 (12/08/20)  - s/p parathyroid surgery - 11/11/2019    Gout: no flare or activity since ~2016    Lower Back Pain with midline radiculopathy: Known underlying degenerative changes. Resolved per Patient.    Left Knee & Right Shoulder Pain: (06/12/20)  -  XR L knee and R shoulder   - Knee: No acute fracture. Mild medial and lateral compartment joint space narrowing.   - R shoulder: No acute fracture. Mild joint space narrowing of the glenohumeral and acromioclavicular joints.   - Referral placed to ortho for left knee.  - Replaced referral for PT for shoulder pain.    Health Maintenance:   - Colonoscopy due 2029  - Blood work once a month    Immunizations:  - Vaccination: PCV-23 completed 07/06/2015.   - Flu shot: 08/31/20  - Prevnar 13 to be given at 1 year post  - Covid vaccine (Moderna): 04/12/20, 05/10/20, 08/17/20. Counseled patient on the benefits of Evusheld antibody for when it becomes available to him.        Follow-Up:  Return to clinic in 3 months    Scribe's Attestation: Leeroy Bock, MD obtained and performed the history, physical exam and medical decision making elements that were entered into the chart.  Signed by Sharion Balloon, Scribe, on December 09, 2020 at 1:02 PM.    Documentation assistance provided by the Scribe. I was present during the time the encounter was recorded. The information recorded by the Scribe was done at my direction and has been reviewed and validated by me.

## 2020-12-16 NOTE — Unmapped (Signed)
[facility] Refill Coordination Note    Specialty Medication(s) to be Shipped:   Transplant: Envarsus 0.75mg     Other medication(s) to be shipped: No additional medications requested for fill at this time     Louis Dennis, DOB: 09/10/1961  Phone: [phone] (home)       All above HIPAA information was verified with patient.     Was a Nurse, learning disability used for this call? Yes, Louis Dennis. Patient language is appropriate in [facility]    Completed refill call assessment today to schedule patient's medication shipment from the [facility] [phone]).       Specialty medication(s) and dose(s) confirmed: Regimen is correct and unchanged.   Changes to medications: Louis Dennis reports no changes at this time.  Changes to insurance: No  Questions for the pharmacist: No    Confirmed patient received Welcome Packet with first shipment. The patient will receive a drug information handout for each medication shipped and additional FDA Medication Guides as required.       DISEASE/MEDICATION-SPECIFIC INFORMATION        N/A    SPECIALTY MEDICATION ADHERENCE     Medication Adherence    Specialty Medication: envarsus 0.75mg   Patient is on additional specialty medications: No  Patient is on more than two specialty medications: No                envarsus 0.75 mg: 10 days of medicine on hand         SHIPPING     Shipping address confirmed in Epic.     Delivery Scheduled: Yes, Expected medication delivery date: 01/18.     Medication will be delivered via Same Day Courier to the prescription address in Epic WAM.    Antonietta Barcelona   [facility] Specialty Technician

## 2020-12-22 MED FILL — ENVARSUS XR 0.75 MG TABLET,EXTENDED RELEASE: ORAL | 30 days supply | Qty: 60 | Fill #1

## 2020-12-30 NOTE — Unmapped (Signed)
[facility] Refill Coordination Note    Specialty Medication(s) to be Shipped:   Transplant: Envarsus 1mg  and Myfortic 180mg     Other medication(s) to be shipped: omeprazole, metoprolol, magnesium and allopurinol     Louis Dennis, DOB: 1961-05-17  Phone: [phone] (home)       All above HIPAA information was verified with patient.     Was a Nurse, learning disability used for this call? Yes, Darl Pikes. Patient language is appropriate in [facility]    Completed refill call assessment today to schedule patient's medication shipment from the [facility] [phone]).       Specialty medication(s) and dose(s) confirmed: Regimen is correct and unchanged.   Changes to medications: Dewon reports no changes at this time.  Changes to insurance: No  Questions for the pharmacist: No    Confirmed patient received Welcome Packet with first shipment. The patient will receive a drug information handout for each medication shipped and additional FDA Medication Guides as required.       DISEASE/MEDICATION-SPECIFIC INFORMATION        N/A    SPECIALTY MEDICATION ADHERENCE     Medication Adherence    Patient reported X missed doses in the last month: 0  Specialty Medication: Myfortic 180mg   Patient is on additional specialty medications: Yes  Additional Specialty Medications: Envarsus 1mg   Patient Reported Additional Medication X Missed Doses in the Last Month: 0  Patient is on more than two specialty medications: No        Myfortic 180 mg: 9 days of medicine on hand   Envarsus 1 mg: 9 days of medicine on hand     SHIPPING     Shipping address confirmed in Epic.     Delivery Scheduled: Yes, Expected medication delivery date: 01/07/2021.     Medication will be delivered via Next Day Courier to the prescription address in Epic WAM.    [name]   [facility] Specialty Technician

## 2020-12-31 ENCOUNTER — Ambulatory Visit: Admit: 2020-12-31 | Discharge: 2021-01-01 | Payer: MEDICARE | Attending: Family | Primary: Family

## 2020-12-31 DIAGNOSIS — I150 Renovascular hypertension: Principal | ICD-10-CM

## 2020-12-31 DIAGNOSIS — R7303 Prediabetes: Principal | ICD-10-CM

## 2020-12-31 DIAGNOSIS — K219 Gastro-esophageal reflux disease without esophagitis: Principal | ICD-10-CM

## 2020-12-31 DIAGNOSIS — S61012A Laceration without foreign body of left thumb without damage to nail, initial encounter: Principal | ICD-10-CM

## 2020-12-31 NOTE — Unmapped (Signed)
Assessment and Plan:     Louis Dennis was seen today for diabetes and hypertension.    Diagnoses and all orders for this visit:    Pre-diabetes  HGB A1c 5.7 (up from 5.5 three months ago).   DM well controlled. Managed by nephrology. Continue metformin 500 mg daily.  Advised to discuss discontinuation of metformin with specialist.   -     POCT glycosylated hemoglobin (Hb A1C); Future  -     POCT glycosylated hemoglobin (Hb A1C)    Renovascular hypertension  BP at goal (98/66 in clinic today). Continue Toprol-XL 25 mg. Reviewed low sodium diet and encouraged regular exercise. Advised to continue to monitor and log at-home BP readings.    Gastroesophageal reflux disease, unspecified whether esophagitis present  Stable. Continue omeprazole 20 mg every other day.     Laceration of left thumb without foreign body, nail damage status unspecified, initial encounter  Pressure dressing intact. Advised to remove dressing in few hours. Counseled on Dermabond if needed.     Return in about 4 months (around 04/30/2021) for Next scheduled follow up.    HPI:      Louis Dennis  is here for   Chief Complaint   Patient presents with   ??? Diabetes     No concerns.  Nonfasting.   ??? Hypertension     Hypertension: Patient presents for follow-up of hypertension. Blood pressure goal < 140/90. Hypertension has customarily been at goal complicated by ESRD. Home blood pressure readings: did not bring log. Salt intake and diet: salt not added to cooking and salt shaker not on table. Associated signs and symptoms: none. Patient denies: blurred vision, chest pain, dyspnea, headache, neck aches, orthopnea, palpitations, paroxysmal nocturnal dyspnea, peripheral edema, pulsating in the ears and tiredness/fatigue. Medication compliance: taking as prescribed. He is doing regular exercise - walking.      Pre-diabetes: Patient presents for follow up of pre-diabetes.  A1C goal is <8.  Diabetes has customarily been at goal. Current symptoms include: none. Symptoms have been well-controlled. Patient denies foot ulcerations, hyperglycemia, hypoglycemia , increase appetite, nausea, paresthesia of the feet, polydipsia, polyuria, visual disturbances, vomitting and weight loss. Evaluation to date has included: hemoglobin A1C.  Home sugars: BGs consistently in an acceptable range - 97-100s generally, sometimes a litter higher 110-120 after eating [place] food or [employer]. Current treatment: Continued metformin which has been effective. Nephrology manages medication. Doing regular exercise: yes - walking.  Patient inquires if he can discontinue medication since BGs have been well controlled.      Patient has continued following with nephrology every 3 months. He has labs monthly. Specialist increased Envarsus to 2.5 mg daily.    He has been experiencing right shoulder pain and left knee pain. He discussed his symptoms with nephrology last visit, who referred him to orthopedics and PT.       Patient reports laceration to left thumb. Injury occurred ~1 hour ago. He accidentally cut himself with a knife while cutting an avocado. He wrapped thumb and believes it has stopped bleeding.     GERD: Patient presents for follow-up of dyspepsia. Symptoms have been present for approximately several years. Current treatment: omeprazole every other day. Results of treatment: considerable improvement in symptom severity and frequency.        ROS:      Comprehensive 10 point ROS negative unless otherwise stated in the HPI.      [facility] Components:     Medication adherence and barriers to the treatment  plan have been addressed. Opportunities to optimize healthy behaviors have been discussed. Patient / caregiver voiced understanding.    Past Medical/Surgical History:     Past Medical History:   Diagnosis Date   ??? Abnormal thyroid function test    ??? End stage renal disease (CMS-HCC)     now on peritoneal dialysis   ??? Gout     reports was in Right hand, foot and left elbow   ??? Hypertension     takes meds intermitttently based on readings   ??? Nephrolithiasis 2014   ??? Visual impairment     glasses     Past Surgical History:   Procedure Laterality Date   ??? OTHER SURGICAL HISTORY Right 08/2017    Pt had catheter moved from left side to right side.   ??? pd catheter  2012   ??? PR COLONOSCOPY W/BIOPSY SINGLE/MULTIPLE N/A 06/15/2018    Procedure: COLONOSCOPY, FLEXIBLE, PROXIMAL TO SPLENIC FLEXURE; WITH BIOPSY, SINGLE OR MULTIPLE;  Surgeon: Charm Rings, MD;  [facility];  Service: Gastroenterology   ??? PR COLSC FLX W/RMVL OF TUMOR POLYP LESION SNARE TQ N/A 06/15/2018    Procedure: COLONOSCOPY FLEX; W/REMOV TUMOR/LES BY SNARE;  Surgeon: Charm Rings, MD;  Location: [facility];  Service: Gastroenterology   ??? PR EXPLORATORY OF ABDOMEN N/A 11/22/2018    Procedure: EXPLORATORY LAPAROTOMY, EXPLORATORY CELIOTOMY WITH OR WITHOUT BIOPSY(S);  Surgeon: Lawrence Marseilles Day Thurnell Lose, MD;  Location: MAIN OR [facility];  Service: Trauma   ??? PR EXPLORE PARATHYROID GLANDS N/A 11/11/2019    Procedure: RS 22 PARATHYROIDECTOMY OR EXPLORATION OF PARATHYROID(S);  Surgeon: Johny Chess, MD;  Location: MAIN OR [facility];  Service: Surgical Oncology   ??? PR FREEING BOWEL ADHESION,ENTEROLYSIS N/A 11/22/2018    Procedure: Enterolysis (Separt Proc);  Surgeon: Lawrence Marseilles Day Thurnell Lose, MD;  Location: MAIN OR [facility];  Service: Trauma   ??? PR REMOVE PERITONEAL FOREIGN BODY N/A 11/22/2018    Procedure: Removal Of Peritoneal Of Foreign Body From Peritoneal Cavity;  Surgeon: Lawrence Marseilles Day Thurnell Lose, MD;  Location: MAIN OR [facility];  Service: Trauma   ??? PR TRANSPLANT,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    Procedure: [facility] STD PREP CAD DONR RENAL ALLOGFT PRIOR TO TRNSPLNT, INCL DISSEC/REM PERINEPH FAT, DIAPH/RTPER ATTAC;  Surgeon: Doyce Loose, MD;  Location: MAIN OR [facility];  Service: Transplant   ??? PR TRANSPLANTATION OF KIDNEY N/A 05/24/2019    Procedure: RENAL ALLOTRANSPLANTATION, IMPLANTATION OF GRAFT; WITHOUT RECIPIENT NEPHRECTOMY;  Surgeon: Doyce Loose, MD;  Location: MA[facility] OR [facility];  Service: Transplant       Family History:     Family History   Problem Relation Age of Onset   ??? Hypertension Mother    ??? Kidney disease Mother    ??? Diabetes Mother    ??? Hypertension Father    ??? Kidney disease Sister         s/p transplant   ??? Hypertension Brother    ??? Glaucoma Neg Hx    ??? Amblyopia Neg Hx    ??? Blindness Neg Hx    ??? Retinal detachment Neg Hx    ??? Strabismus Neg Hx    ??? Macular degeneration Neg Hx    ??? Basal cell carcinoma Neg Hx    ??? Melanoma Neg Hx    ??? Squamous cell carcinoma Neg Hx        Social History:     Social History     Tobacco Use   ???  Smoking status: Former Smoker     Packs/day: 1.00     Years: 30.00     Pack years: 30.00     Types: Cigarettes     Quit date: 07/10/2009     Years since quitting: 11.4   ??? Smokeless tobacco: Never Used   Vaping Use   ??? Vaping Use: Never used   Substance Use Topics   ??? Alcohol use: No     Alcohol/week: 0.0 standard drinks   ??? Drug use: No       Allergies:     Patient has no known allergies.    Current Medications:     Current Outpatient Medications   Medication Sig Dispense Refill   ??? allopurinoL (ZYLOPRIM) 100 MG tablet Take 1 tablet (100 mg total) by mouth daily. (Patient taking differently: Take 100 mg by mouth every other day. ) 30 tablet 11   ??? biotin 5 mg cap Take 1 capsule (5,000 mcg total) by mouth daily. (Patient taking differently: Take 5 mg by mouth every other day. )  0   ??? magnesium oxide-Mg AA chelate (MAGNESIUM, AMINO ACID CHELATE,) 133 mg Tab Take 1 tablet by mouth Two (2) times a day. 60 tablet 11   ??? metFORMIN (GLUCOPHAGE-XR) 500 MG 24 hr tablet TAKE 1 TABLET (500 MG TOTAL) BY MOUTH DAILY WITH EVENING MEAL. 90 tablet 3   ??? metoprolol succinate (TOPROL XL) 25 MG 24 hr tablet Take 2 tablets (50 mg total) by mouth nightly. (Patient taking differently: Take 25 mg by mouth nightly. ) 60 tablet 11   ??? MYFORTIC 180 mg EC tablet Take 3 tablets (540 mg total) by mouth Two (2) times a day. 180 tablet 11   ??? omeprazole (PRILOSEC) 20 MG capsule Take 1 capsule (20 mg total) by mouth daily. (Patient taking differently: Take 20 mg by mouth every other day. ) 30 capsule 11   ??? tacrolimus (ENVARSUS XR) 0.75 mg Tb24 extended release tablet Take two (0.75mg ) tablets with one 1mg  tablet every morning to equal 2.5mg  daily dose 60 tablet 11   ??? tacrolimus (ENVARSUS XR) 1 mg Tb24 extended release tablet Take one (1mg ) tablet with two (0.75mg ) tablets every morning to equal 2.5mg  daily dose 30 tablet 11   ??? triamcinolone (KENALOG) 0.1 % cream Apply topically Two (2) times a day. 80 g 1     No current facility-administered medications for this visit.       Health Maintenance:     Health Maintenance   Topic Date Due   ??? Zoster Vaccines (1 of 2) Never done   ??? COVID-19 Vaccine (3 - Moderna risk 4-dose series) 09/12/2020   ??? Lipid Screening  06/12/2025   ??? Colonoscopy  06/15/2028   ??? DTaP/Tdap/Td Vaccines (2 - Td or Tdap) 12/30/2029   ??? Hepatitis C Screen  Completed   ??? Influenza Vaccine  Completed       Immunizations:     Immunization History   Administered Date(s) Administered   ??? COVID-19 VACCINE,MRNA(MODERNA)(PF)(IM) 04/12/2020, 08/15/2020   ??? INFLUENZA INJ MDCK PF, QUAD,(FLUCELVAX)(62MO AND UP EGG FREE) 09/04/2017, 08/31/2020   ??? INFLUENZA TIV (TRI) PF (IM) 08/17/2011, 08/08/2012, 08/05/2013, 08/28/2014, 08/11/2015, 08/29/2015   ??? Influenza Vaccine Quad (IIV4 PF) 73mo+ injectable 08/16/2015, 09/06/2016, 09/11/2017, 08/06/2019   ??? Influenza Virus Vaccine, unspecified formulation 08/30/2016, 09/18/2017, 09/04/2018, 10/02/2018   ??? PNEUMOCOCCAL POLYSACCHARIDE 23 07/06/2015   ??? PPD Test 09/08/2011, 09/07/2012, 09/05/2013, 09/09/2014, 09/07/2015, 08/10/2016, 09/11/2017, 09/05/2018, 01/03/2019   ??? Pneumococcal Conjugate 13-Valent  07/24/2010, 07/15/2015   ??? TdaP 12/31/2019     I have reviewed and (if needed) updated the patient's problem list, medications, allergies, past medical and surgical history, social and family history.     Vital Signs:     Wt Readings from Last 3 Encounters:   12/31/20 64.4 kg (142 lb)   09/18/20 64 kg (141 lb)   08/31/20 64.4 kg (142 lb)     Temp Readings from Last 3 Encounters:   12/31/20 36.6 ??C (97.8 ??F) (Oral)   12/09/20 36.6 ??C (97.8 ??F) (Temporal)   09/18/20 36.4 ??C (97.5 ??F) (Temporal)     BP Readings from Last 3 Encounters:   12/31/20 98/66   12/09/20 111/74   09/18/20 107/67     Pulse Readings from Last 3 Encounters:   12/31/20 75   12/09/20 84   09/18/20 71     Estimated body mass index is 25.15 kg/m?? as calculated from the following:    Height as of this encounter: 160 cm (5' 3).    Weight as of this encounter: 64.4 kg (142 lb).  Facility age limit for growth percentiles is 20 years.      Objective:      General: Alert and oriented x3. Well-appearing. No acute distress.   HEENT:  Normocephalic.  Atraumatic. Conjunctiva and sclera normal. OP MMM without lesions.   Neck:  Supple. No thyroid enlargement. No adenopathy.   Heart:  Regular rate and rhythm . Normal S1, S2.  No murmurs, rubs or gallops.   Lungs:  No respiratory distress.  Lungs clear to auscultation. No wheezes, rhonchi, or rales.   GI/GU:  Soft, +BS, nondistended, non-TTP.   Extremities:  No edema. Peripheral pulses normal. Dressing intact to left thumb.   Skin:  Warm, dry. No rash or lesions present.   Neuro:  Non-focal. No obvious weakness.   Psych:  Affect normal, eye contact good, speech clear and coherent.      I attest that I, Bayard Hugger, personally documented this note while acting as scribe for Noralyn Pick, FNP.      Bayard Hugger, Scribe.  12/31/2020     The documentation recorded by the scribe accurately reflects the service I personally performed and the decisions made by me.    Noralyn Pick, FNP

## 2021-01-06 MED FILL — ALLOPURINOL 100 MG TABLET: ORAL | 30 days supply | Qty: 30 | Fill #6

## 2021-01-06 MED FILL — MG-PLUS-PROTEIN 133 MG TABLET: ORAL | 30 days supply | Qty: 60 | Fill #5

## 2021-01-06 MED FILL — MYFORTIC 180 MG TABLET,DELAYED RELEASE: ORAL | 30 days supply | Qty: 180 | Fill #11

## 2021-01-06 MED FILL — ENVARSUS XR 1 MG TABLET,EXTENDED RELEASE: ORAL | 30 days supply | Qty: 30 | Fill #1

## 2021-01-06 MED FILL — OMEPRAZOLE 20 MG CAPSULE,DELAYED RELEASE: ORAL | 30 days supply | Qty: 30 | Fill #11

## 2021-01-06 MED FILL — METOPROLOL SUCCINATE ER 25 MG TABLET,EXTENDED RELEASE 24 HR: ORAL | 30 days supply | Qty: 60 | Fill #1

## 2021-01-08 ENCOUNTER — Ambulatory Visit: Admit: 2021-01-08 | Discharge: 2021-01-09 | Payer: MEDICARE

## 2021-01-08 DIAGNOSIS — Z79899 Other long term (current) drug therapy: Principal | ICD-10-CM

## 2021-01-08 DIAGNOSIS — Z940 Kidney transplant status: Principal | ICD-10-CM

## 2021-01-08 LAB — CBC W/ AUTO DIFF
BASOPHILS ABSOLUTE COUNT: 0.1 10*9/L (ref 0.0–0.1)
BASOPHILS RELATIVE PERCENT: 1.4 %
EOSINOPHILS ABSOLUTE COUNT: 0.4 10*9/L (ref 0.0–0.7)
EOSINOPHILS RELATIVE PERCENT: 8.8 %
HEMATOCRIT: 44.5 % (ref 38.0–50.0)
HEMOGLOBIN: 14.9 g/dL (ref 13.5–17.5)
LYMPHOCYTES ABSOLUTE COUNT: 0.5 10*9/L — ABNORMAL LOW (ref 0.7–4.0)
LYMPHOCYTES RELATIVE PERCENT: 11.7 %
MEAN CORPUSCULAR HEMOGLOBIN CONC: 33.5 g/dL (ref 30.0–36.0)
MEAN CORPUSCULAR HEMOGLOBIN: 30.8 pg (ref 26.0–34.0)
MEAN CORPUSCULAR VOLUME: 91.9 fL (ref 81.0–95.0)
MEAN PLATELET VOLUME: 8.9 fL (ref 7.0–10.0)
MONOCYTES ABSOLUTE COUNT: 0.4 10*9/L (ref 0.1–1.0)
MONOCYTES RELATIVE PERCENT: 8.2 %
NEUTROPHILS ABSOLUTE COUNT: 3 10*9/L (ref 1.7–7.7)
NEUTROPHILS RELATIVE PERCENT: 69.9 %
NUCLEATED RED BLOOD CELLS: 0 /100{WBCs} (ref <=4)
PLATELET COUNT: 181 10*9/L (ref 150–450)
RED BLOOD CELL COUNT: 4.84 10*12/L (ref 4.32–5.72)
RED CELL DISTRIBUTION WIDTH: 14.3 % (ref 12.0–15.0)
WBC ADJUSTED: 4.3 10*9/L (ref 3.5–10.5)

## 2021-01-08 LAB — BASIC METABOLIC PANEL
ANION GAP: 7 mmol/L (ref 5–14)
BLOOD UREA NITROGEN: 17 mg/dL (ref 9–23)
BUN / CREAT RATIO: 26
CALCIUM: 10 mg/dL (ref 8.7–10.4)
CHLORIDE: 106 mmol/L (ref 98–107)
CO2: 27.3 mmol/L (ref 20.0–31.0)
CREATININE: 0.65 mg/dL
EGFR CKD-EPI AA MALE: >90 mL/min/{1.73_m2} (ref >=60)
EGFR CKD-EPI NON-AA MALE: >90 mL/min/{1.73_m2} (ref >=60)
GLUCOSE RANDOM: 120 mg/dL (ref 70–179)
POTASSIUM: 4.3 mmol/L (ref 3.4–4.5)
SODIUM: 140 mmol/L (ref 135–145)

## 2021-01-08 LAB — PHOSPHORUS: PHOSPHORUS: 3 mg/dL (ref 2.4–5.1)

## 2021-01-08 LAB — MAGNESIUM: MAGNESIUM: 1.9 mg/dL (ref 1.6–2.6)

## 2021-01-08 LAB — TACROLIMUS LEVEL, TROUGH: TACROLIMUS, TROUGH: 8.2 ng/mL (ref 5.0–15.0)

## 2021-01-13 NOTE — Unmapped (Signed)
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     Was a Nurse, learning disability used for this call? Yes, Jasmine December. Patient language is appropriate in [facility]    Completed refill call assessment today to schedule patient's medication shipment from the [facility] [phone]).       Specialty medication(s) and dose(s) confirmed: Regimen is correct and unchanged.   Changes to medications: Louis Dennis reports no changes at this time.  Changes to insurance: No  Questions for the pharmacist: No    Confirmed patient received Welcome Packet with first shipment. The patient will receive a drug information handout for each medication shipped and additional FDA Medication Guides as required.       DISEASE/MEDICATION-SPECIFIC INFORMATION        N/A    SPECIALTY MEDICATION ADHERENCE     Medication Adherence    Patient reported X missed doses in the last month: 0  Specialty Medication: Envarsus 0.75mg   Patient is on additional specialty medications: No        Envarsus 0.75 mg: 10 days of medicine on hand     SHIPPING     Shipping address confirmed in Epic.     Delivery Scheduled: Yes, Expected medication delivery date: 01/21/2021.     Medication will be delivered via Next Day Courier to the prescription address in Epic WAM.    [name]   [facility] Specialty Technician

## 2021-01-20 ENCOUNTER — Ambulatory Visit
Admit: 2021-01-20 | Discharge: 2021-02-18 | Payer: MEDICARE | Attending: Rehabilitative and Restorative Service Providers" | Primary: Rehabilitative and Restorative Service Providers"

## 2021-01-20 MED FILL — ENVARSUS XR 0.75 MG TABLET,EXTENDED RELEASE: ORAL | 30 days supply | Qty: 60 | Fill #2

## 2021-01-20 NOTE — Unmapped (Signed)
[facility]  OUTPATIENT PHYSICAL THERAPY  01/20/2021  Note Type: Evaluation       Patient Name: Louis Dennis  Date of Birth:[birth year]  Diagnosis:   Encounter Diagnoses   Name Primary?   ??? Chronic left shoulder pain    ??? Chronic right shoulder pain    ??? Chronic pain of left knee      Referring MD:  Leeroy Bock, MD     Date of Onset of Impairment-Chronic (about a year ago)  Date PT Care Plan Established or Reviewed-01/20/2021  Date PT Treatment Started- 01/20/2021  Plan of Care Effective Date: 01/20/2021 - 04/29/2021    Assessment/Plan:    Assessment details:       Pt is a 60 year old male with chief complaints of left and right shoulder pain, as well as left knee pain.  Based on patient's presentation in clinic today, signs/symptoms appear to be most consistent with rotator cuff tendinopathy, including increased pain, decreased range of motion, decreased joint mobility, decreased strength, poor functional posture, and difficulty with his daily activities.  Pt would benefit from continued physical therapy to address pain and functional limitations.  Impairments: decreased mobility, impaired ADLs, pain, postural weakness, decreased strength and decreased range of motion        Examination of Body Systems: 4+ elements  Body System: posture, ROM, strength, activity limitations, participation restrictions      Prognosis: good prognosis  Positive Prognosis Rationale: motivated for treatment and ADL performance.  Negative Prognosis Rationale: chronicity of condition.    Therapy Goals  Goals:      In 12 weeks:  1. Pt will be compliant and independent with HEP to aid in self-management of symptoms.  2. Pt will achieve shoulder AROM WNL in all planes for full mobility during reaching and dressing tasks.  3. Pt's pain will decrease to 1/10 at all times to enable performance of daily activities with less pain.  4. Pt will be able to reaching overhead as well as squat without pain to return to PLOF.  Plan  Therapy options: will be seen for skilled physical therapy services  Planned therapy interventions: body mechanics training, education - patient, functional mobility, home exercise program, therapeutic exercises, therapeutic activities, postural training, manual therapy and neuromuscular re-education    Frequency: 1x week  Duration in weeks: up to 12 weeks  Education provided to: patient.  Education provided: anatomy, HEP, indications/contraindications to exercises, role of therapy in Rehabilitation, treatment options and plan, self-soft tissue mobilization, importance of Therapy and body awareness  Education results: needs reinforcement and needs further instruction.      Total Session Time: 45          Subjective:   History of Present Illness  Date of Onset: 01/21/2020    Date of Evaluation: 01/20/2021    Reason for Referral/Chief Complaint:       Shoulder pain R and L, as well as L knee pain.  Subjective:     The patient reports he has been having some pain in both shoulders and in his left knee. He had kidney surgery about 1 and a half year ago, and reports he started having some shoulder and knee problem as he has increased his activity level post surgery about 6 months later. He states that his left shoulder and left knee are the worst at this time.  He has been doing a lot of walking, as well as a lot of arm movement, and states that he  was very weak secondary to his kidney problem. 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  The pain in her knee is mostly at night time in the medial portion of his knee.      Pain  Current pain rating: 0  At best pain rating: 0  At worst pain rating: 2  Quality: aching, discomfort, tightness and tender  Relieving factors: rest  Aggravating factors: performance of arm dominant activites, overhead activity, reaching and sleeping  Pain Related Behaviors: none  Progression: no change        Precautions and Equipment  Precautions: None  Current Braces/Orthoses: None  Equipment Currently Used: None  Social Support  Hand dominance: right      Treatments  No previous or current treatments      Patient Goals  Patient goals for therapy: improved ambulation, increased ROM, increased strength, decreased pain and return to recreational activites        Objective:   Objective  Observation: The patient presents with slightly flexed forward posture, with decreased posterior chain muscle activation in order to support his spine in sitting and standing.    AROM:  Shoulder:  Flexion: R: 150 , L: 145  Abduction: R: 150 , L: 145  ER: R: 40 , L: 35  IR: R: T10 , L: L2    Cervical spine screen: no significant limitations    PROM:  Shoulder:  Flexion: R: 160 , L: 155  Abduction: R: 155 , L: 150  ER: R: 75 , L: 60  IR: R: 60 , L: 40    Cervical spine screen: no limitations    MMT:  Shoulder:  Flexion: R: 4+/5 , L: 4/5  Abduction: R: 4/5 , L: 4-/5  ER: R: 4/5 , L: 4-/5  IR: R: 5/5 , L: 5/5    Elbow:  Flexion: R: 5/5 , L: 5/5  Extension: R: 5/5 , L: 5/5    Accessory motion testing:  Shoulder: decreased PA mobility with infero lateral glides (L more than R)  Thoracic: moderate-severe hypomobility with PA assessment in thoracic spine and rib cage    Palpation: no significant pain with palpation    Knee: full range of motion (slight pain at end range flexion L side). Assess MMT for knee next session. Normal accessory motion.      Treatment rendered today:  Therapeutic exercises:   Thoracic extensions on chair and foam roll  ER with orange band: 2 x fatigue bilaterally  Semi circles on wall: 2 x fatigue        PT Evaluation Charges  $$ PT Evaluation - MOD Complexity [mins]: 30     Therapeutic Interventions Charges  $$ Therapeutic Exercise [mins]: 15                 I attest that I have reviewed the above information.  SignedBrynda Rim, PT  01/20/2021 9:55 AM

## 2021-01-28 DIAGNOSIS — Z940 Kidney transplant status: Principal | ICD-10-CM

## 2021-01-28 MED ORDER — MYFORTIC 180 MG TABLET,DELAYED RELEASE
ORAL_TABLET | Freq: Two times a day (BID) | ORAL | 11 refills | 30.00000 days | Status: CP
Start: 2021-01-28 — End: 2022-01-28
  Filled 2021-02-03: qty 180, 30d supply, fill #0

## 2021-01-28 MED ORDER — OMEPRAZOLE 20 MG CAPSULE,DELAYED RELEASE
ORAL_CAPSULE | Freq: Every day | ORAL | 11 refills | 30 days | Status: CP
Start: 2021-01-28 — End: 2022-01-28
  Filled 2021-02-03: qty 30, 30d supply, fill #0

## 2021-01-28 NOTE — Unmapped (Signed)
[facility] Refill Coordination Note    Specialty Medication(s) to be Shipped:   Transplant: Envarsus 1mg  and Myfortic 180mg     Other medication(s) to be shipped:    omeprazole, metoprolol, magnesium and allopurinol       [name], DOB: [birth year]  Phone: [phone] (home)       All above HIPAA information was verified with patient.     Was a Nurse, learning disability used for this call? No    Completed refill call assessment today to schedule patient's medication shipment from the [facility] [phone]).       Specialty medication(s) and dose(s) confirmed: Regimen is correct and unchanged.   Changes to medications: Jhony reports no changes at this time.  Changes to insurance: No  Questions for the pharmacist: No    Confirmed patient received Welcome Packet with first shipment. The patient will receive a drug information handout for each medication shipped and additional FDA Medication Guides as required.       DISEASE/MEDICATION-SPECIFIC INFORMATION        N/A    SPECIALTY MEDICATION ADHERENCE                 Myfortic 180 mg: 9 days of medicine on hand   Envarsus 1 mg: 9 days of medicine on hand         SHIPPING     Shipping address confirmed in Epic.     Delivery Scheduled: Yes, Expected medication delivery date: 02/03/21.     Medication will be delivered via Same Day Courier to the prescription address in Epic WAM.    [place] A Shelda Truby   [facility] Specialty Technician

## 2021-01-29 NOTE — Unmapped (Incomplete)
[facility] PT [facility] [place]  OUTPATIENT PHYSICAL THERAPY  01/29/2021  Note Type: Treatment Note       Patient Name: Louis Dennis  Date of Birth:1961-05-09  Diagnosis:   Encounter Diagnoses   Name Primary?   ??? Chronic left shoulder pain Yes   ??? Chronic right shoulder pain    ??? Chronic pain of left knee      Referring MD:  Leeroy Bock, MD     Date of Onset of Impairment-Chronic (about a year ago)  Date PT Care Plan Established or Reviewed-01/20/2021  Date PT Treatment Started- 01/20/2021  Plan of Care Effective Date: 01/20/2021 - 04/29/2021    Assessment/Plan:    Assessment details:       Pt is a 60 year old male with chief complaints of left and right shoulder pain, as well as left knee pain.  Based on patient's presentation in clinic today, signs/symptoms appear to be most consistent with rotator cuff tendinopathy, including increased pain, decreased range of motion, decreased joint mobility, decreased strength, poor functional posture, and difficulty with his daily activities.  Pt would benefit from continued physical therapy to address pain and functional limitations.  Impairments: decreased mobility, impaired ADLs, pain, postural weakness, decreased strength and decreased range of motion        Examination of Body Systems: 4+ elements  Body System: posture, ROM, strength, activity limitations, participation restrictions      Prognosis: good prognosis  Positive Prognosis Rationale: motivated for treatment and ADL performance.  Negative Prognosis Rationale: chronicity of condition.    Therapy Goals  Goals:      In 12 weeks:  1. Pt will be compliant and independent with HEP to aid in self-management of symptoms.  2. Pt will achieve shoulder AROM WNL in all planes for full mobility during reaching and dressing tasks.  3. Pt's pain will decrease to 1/10 at all times to enable performance of daily activities with less pain.  4. Pt will be able to reaching overhead as well as squat without pain to return to PLOF.  Plan  Therapy options: will be seen for skilled physical therapy services  Planned therapy interventions: body mechanics training, education - patient, functional mobility, home exercise program, therapeutic exercises, therapeutic activities, postural training, manual therapy and neuromuscular re-education    Frequency: 1x week  Duration in weeks: up to 12 weeks  Education provided to: patient.  Education provided: anatomy, HEP, indications/contraindications to exercises, role of therapy in Rehabilitation, treatment options and plan, self-soft tissue mobilization, importance of Therapy and body awareness  Education results: needs reinforcement and needs further instruction.      Total Session Time: 45          Subjective:   History of Present Illness  Date of Onset: 01/21/2020    Date of Evaluation: 01/20/2021    Reason for Referral/Chief Complaint:       Shoulder pain R and L, as well as L knee pain.  Subjective:     The patient reports he has been having some pain in both shoulders and in his left knee. He had kidney surgery about 1 and a half year ago, and reports he started having some shoulder and knee problem as he has increased his activity level post surgery about 6 months later. He states that his left shoulder and left knee are the worst at this time.  He has been doing a lot of walking, as well as a lot of arm movement, and states that  he was very weak secondary to his kidney problem. 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  The pain in her knee is mostly at night time in the medial portion of his knee.      Pain  Current pain rating: 0  At best pain rating: 0  At worst pain rating: 2  Quality: aching, discomfort, tightness and tender  Relieving factors: rest  Aggravating factors: performance of arm dominant activites, overhead activity, reaching and sleeping  Pain Related Behaviors: none  Progression: no change        Precautions and Equipment  Precautions: None  Current Braces/Orthoses: None  Equipment Currently Used: None  Social Support  Hand dominance: right      Treatments  No previous or current treatments      Patient Goals  Patient goals for therapy: improved ambulation, increased ROM, increased strength, decreased pain and return to recreational activites        Objective:   Objective  Observation: The patient presents with slightly flexed forward posture, with decreased posterior chain muscle activation in order to support his spine in sitting and standing.    AROM:  Shoulder:  Flexion: R: 150 , L: 145  Abduction: R: 150 , L: 145  ER: R: 40 , L: 35  IR: R: T10 , L: L2    Cervical spine screen: no significant limitations    PROM:  Shoulder:  Flexion: R: 160 , L: 155  Abduction: R: 155 , L: 150  ER: R: 75 , L: 60  IR: R: 60 , L: 40    Cervical spine screen: no limitations    MMT:  Shoulder:  Flexion: R: 4+/5 , L: 4/5  Abduction: R: 4/5 , L: 4-/5  ER: R: 4/5 , L: 4-/5  IR: R: 5/5 , L: 5/5    Elbow:  Flexion: R: 5/5 , L: 5/5  Extension: R: 5/5 , L: 5/5    Accessory motion testing:  Shoulder: decreased PA mobility with infero lateral glides (L more than R)  Thoracic: moderate-severe hypomobility with PA assessment in thoracic spine and rib cage    Palpation: no significant pain with palpation    Knee: full range of motion (slight pain at end range flexion L side). Assess MMT for knee next session. Normal accessory motion.      Treatment rendered today:  Therapeutic exercises:   Thoracic extensions on chair and foam roll  ER with orange band: 2 x fatigue bilaterally  Semi circles on wall: 2 x fatigue                                I attest that I have reviewed the above information.  Signed: Verneita Griffes, PT  01/29/2021 2:31 PM

## 2021-02-02 NOTE — Unmapped (Signed)
Interpreter Services Used:Louis Dennis [phone]    Complex Case Management  SUMMARY NOTE    [facility] Care Coordinator spoke with patient and verified correct patient using two identifiers today to introduce the Complex Case Management program.     Discussed the following:  Program Services, Expectations of participation and Verified Demographics    Program status: Declined  Additional items discussed: Patient states that he does not need the services of the Complex Case Management program at this time as Care Coordinator has voiced understanding and will send our contact information to patient via his [place] My Chart shall he need to utilize our services in the future as patient has voiced understanding.         CC Note     This patient has declined Complex Case Management services on 02/02/21. To have this patent reevaluated for Complex Case Management please send an AMB Referral to Case Management to the Personal [facility] Department.       Louis Dennis - High Risk Care Coordinator   [facility]  [address] [place], [place] [address]  P: [phone] F: [phone]  [name]

## 2021-02-03 ENCOUNTER — Ambulatory Visit: Admit: 2021-02-03 | Discharge: 2021-02-04 | Payer: MEDICARE

## 2021-02-03 LAB — BASIC METABOLIC PANEL
ANION GAP: 7 mmol/L (ref 5–14)
BLOOD UREA NITROGEN: 19 mg/dL (ref 9–23)
BUN / CREAT RATIO: 29
CALCIUM: 9.7 mg/dL (ref 8.7–10.4)
CHLORIDE: 105 mmol/L (ref 98–107)
CO2: 25.6 mmol/L (ref 20.0–31.0)
CREATININE: 0.65 mg/dL
EGFR CKD-EPI AA MALE: >90 mL/min/{1.73_m2} (ref >=60)
EGFR CKD-EPI NON-AA MALE: >90 mL/min/{1.73_m2} (ref >=60)
GLUCOSE RANDOM: 132 mg/dL (ref 70–179)
POTASSIUM: 4.4 mmol/L (ref 3.4–4.8)
SODIUM: 138 mmol/L (ref 135–145)

## 2021-02-03 LAB — CBC W/ AUTO DIFF
BASOPHILS ABSOLUTE COUNT: 0.1 10*9/L (ref 0.0–0.1)
BASOPHILS RELATIVE PERCENT: 1.1 %
EOSINOPHILS ABSOLUTE COUNT: 0.5 10*9/L (ref 0.0–0.5)
EOSINOPHILS RELATIVE PERCENT: 9.2 %
HEMATOCRIT: 43.7 % (ref 39.0–48.0)
HEMOGLOBIN: 14.4 g/dL (ref 12.9–16.5)
LYMPHOCYTES ABSOLUTE COUNT: 0.6 10*9/L — ABNORMAL LOW (ref 1.1–3.6)
LYMPHOCYTES RELATIVE PERCENT: 11.3 %
MEAN CORPUSCULAR HEMOGLOBIN CONC: 33 g/dL (ref 32.0–36.0)
MEAN CORPUSCULAR HEMOGLOBIN: 30.3 pg (ref 25.9–32.4)
MEAN CORPUSCULAR VOLUME: 91.7 fL (ref 77.6–95.7)
MEAN PLATELET VOLUME: 9.9 fL (ref 6.8–10.7)
MONOCYTES ABSOLUTE COUNT: 0.4 10*9/L (ref 0.3–0.8)
MONOCYTES RELATIVE PERCENT: 7.9 %
NEUTROPHILS ABSOLUTE COUNT: 3.6 10*9/L (ref 1.8–7.8)
NEUTROPHILS RELATIVE PERCENT: 70.5 %
NUCLEATED RED BLOOD CELLS: 0 /100{WBCs} (ref <=4)
PLATELET COUNT: 172 10*9/L (ref 150–450)
RED BLOOD CELL COUNT: 4.76 10*12/L (ref 4.26–5.60)
RED CELL DISTRIBUTION WIDTH: 14.7 % (ref 12.2–15.2)
WBC ADJUSTED: 5.1 10*9/L (ref 3.6–11.2)

## 2021-02-03 LAB — MAGNESIUM: MAGNESIUM: 1.9 mg/dL (ref 1.6–2.6)

## 2021-02-03 LAB — PHOSPHORUS: PHOSPHORUS: 2.6 mg/dL (ref 2.4–5.1)

## 2021-02-03 MED FILL — MG-PLUS-PROTEIN 133 MG TABLET: ORAL | 30 days supply | Qty: 60 | Fill #6

## 2021-02-03 MED FILL — ALLOPURINOL 100 MG TABLET: ORAL | 30 days supply | Qty: 30 | Fill #7

## 2021-02-03 MED FILL — METOPROLOL SUCCINATE ER 25 MG TABLET,EXTENDED RELEASE 24 HR: ORAL | 30 days supply | Qty: 60 | Fill #2

## 2021-02-03 MED FILL — ENVARSUS XR 1 MG TABLET,EXTENDED RELEASE: ORAL | 30 days supply | Qty: 30 | Fill #2

## 2021-02-04 LAB — TACROLIMUS LEVEL, TROUGH: TACROLIMUS, TROUGH: 7.4 ng/mL (ref 5.0–15.0)

## 2021-02-04 NOTE — Unmapped (Cosign Needed)
[facility] PT [facility] [place]  OUTPATIENT PHYSICAL THERAPY  02/04/2021  Note Type: Treatment Note       Patient Name: Louis Dennis  Date of Birth:06/23/1961  Diagnosis:   Encounter Diagnoses   Name Primary?   ??? Chronic left shoulder pain Yes   ??? Chronic right shoulder pain    ??? Chronic pain of left knee      Referring MD:  Leeroy Bock, MD     Date of Onset of Impairment-Chronic (about a year ago)  Date PT Care Plan Established or Reviewed-01/20/2021  Date PT Treatment Started- 01/20/2021  Plan of Care Effective Date: 01/20/2021 - 04/29/2021   Visit: 3    Assessment/Plan:    Assessment details:   Pt presents noting increased pain with shoulder HEP which has led to less frequent performance so focused today on emphasizing more frequency performance through less painful ROM. Reviewed exercises with pt demonstrating better form performing ER with dowel vs wall and pt notes only mild pain with overhead wall flexion slides, utilizing full current ROM. Plan to monitor ROM closely and continue with mobilizations if ROM is presenting with good carryover.     Therapy Goals  Goals:      In 12 weeks:  1. Pt will be compliant and independent with HEP to aid in self-management of symptoms.  2. Pt will achieve shoulder AROM WNL in all planes for full mobility during reaching and dressing tasks.  3. Pt's pain will decrease to 1/10 at all times to enable performance of daily activities with less pain.  4. Pt will be able to reaching overhead as well as squat without pain to return to PLOF.  Plan  Therapy options: will be seen for skilled physical therapy services  Planned therapy interventions: body mechanics training, education - patient, functional mobility, home exercise program, therapeutic exercises, therapeutic activities, postural training, manual therapy and neuromuscular re-education    Frequency: 1x week  Duration in weeks: up to 12 weeks  Education provided to: patient.  Education provided: anatomy, HEP, indications/contraindications to exercises, role of therapy in Rehabilitation, treatment options and plan, self-soft tissue mobilization, importance of Therapy and body awareness  Education results: needs reinforcement and needs further instruction.    Subjective:   History of Present Illness  Date of Onset: 01/21/2020    Date of Evaluation: 01/20/2021    Reason for Referral/Chief Complaint:       Shoulder pain R and L, as well as L knee pain.  Subjective:   Pt reports that his shoulders feel a bit better but has note been performing exercises for them each day due to some pain with the exercises. Has been performing every other day. Was very sore and notes some increased knee pain following last treatment that lasted 3-4 days but has resolved since. Notes he generally walks 5-7K steps per day and will feel increased soreness in knees after walking more.       Pain  Current pain rating: 0  At best pain rating: 0  At worst pain rating: 2    Patient Goals  Patient goals for therapy: improved ambulation, increased ROM, increased strength, decreased pain and return to recreational activites      Objective:   Objective    AROM:  Shoulder:  Flexion: R: 146-->154 , L: 141-->146  Abduction: R: 150 , L: 95    PROM:  Shoulder:  Flexion: R: 156, L: 145  Abduction: R: 162, L: 95  ER: R: 64-->75 , L:  46-->52  IR: R: 50 , L: 32    Wall slide at end of session: 149 L    Accessory motion testing:  Shoulder: decreased posterior and inferior mobility (L>R)    Treatment rendered today:  Therapeutic exercises: 30 minutes  Education on dosing LE/UE exercises, posture and importance of changing positions often, allowable pain with exercise  Wall walk with end range anterior trunk lean, 20x, pillow case around hands  L ER stretch at neutral on wall, moved to seated with cane to improve anterior focus of stretch 20x5    Manual therapy: 15 minutes  L GH mobs inferior and posterior at ~80 deg abduction/supine gr. 2-3 5x30 each  L shoulder PROM into flexion, abduction, ER    Total treatment time: 45 min      I attest that I have reviewed the above information.  Signed: Verneita Griffes, PT  02/04/2021 11:15 AM

## 2021-02-11 NOTE — Unmapped (Signed)
[facility] PT [facility] [place]  OUTPATIENT PHYSICAL THERAPY  02/11/2021  Note Type: Treatment Note       Patient Name: Louis Dennis  Date of Birth:Dec 03, 1961  Diagnosis:   Encounter Diagnoses   Name Primary?   ??? Chronic right shoulder pain Yes   ??? Chronic left shoulder pain      Referring MD:  Leeroy Bock, MD     Date of Onset of Impairment-Chronic (about a year ago)  Date PT Care Plan Established or Reviewed-01/20/2021  Date PT Treatment Started- 01/20/2021  Plan of Care Effective Date: 01/20/2021 - 04/29/2021   Visit: 4    Assessment/Plan:    Assessment details:   Pt presents with limited carryover in flexion ROM upon initial assessment during today's treatment but with good improvement in subjective pain. Mobilizations continue to increase ROM with less pain at end range and trialed supine flexion mobility exercises today with pt achieving more motion compared with wall slides, so added this to HEP. Pt to f/u in 2 weeks for f/u and will continue to monitor ROM at that time as more infrequent treatment sessions may be appropriate.    Therapy Goals  Goals:      In 12 weeks:  1. Pt will be compliant and independent with HEP to aid in self-management of symptoms.  2. Pt will achieve shoulder AROM WNL in all planes for full mobility during reaching and dressing tasks.  3. Pt's pain will decrease to 1/10 at all times to enable performance of daily activities with less pain.  4. Pt will be able to reaching overhead as well as squat without pain to return to PLOF.  Plan  Therapy options: will be seen for skilled physical therapy services  Planned therapy interventions: body mechanics training, education - patient, functional mobility, home exercise program, therapeutic exercises, therapeutic activities, postural training, manual therapy and neuromuscular re-education    Frequency: 1x week  Duration in weeks: up to 12 weeks  Education provided to: patient.  Education provided: anatomy, HEP, indications/contraindications to exercises, role of therapy in Rehabilitation, treatment options and plan, self-soft tissue mobilization, importance of Therapy and body awareness  Education results: needs reinforcement and needs further instruction.    Subjective:   History of Present Illness  Date of Onset: 01/21/2020    Date of Evaluation: 01/20/2021    Reason for Referral/Chief Complaint:       Shoulder pain R and L, as well as L knee pain.  Subjective:   Pt reports that his shoulder is feeling pretty good since last appt, noting no pain other than during exercises. 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 L knee is also feeling good, has been working on lateral step downs and lateral band walks with expected muscular soreness.       Pain  Current pain rating: 0  At best pain rating: 0  At worst pain rating: 2    Patient Goals  Patient goals for therapy: improved ambulation, increased ROM, increased strength, decreased pain and return to recreational activites      Objective:   Objective    AROM:  Shoulder:  Flexion: R: 146-->154 , L: 141-->156  Abduction: R: 150 , L: 95    PROM:  Shoulder:  ER: R: 64-->75 , L: 42 --> 56    Accessory motion testing:  Shoulder: decreased posterior and inferior mobility (L>R)    Treatment rendered today:  Therapeutic exercises: 30  minutes  Education on dosing LE/UE exercises, posture and importance of changing positions often, allowable pain with exercise  Supine cane flexion 15x10, 10x10 with 3# ankle weight added, able to perform up to 160 deg flexion with 2-3/10 pain  Wall angels for abduction, 20x, cueing to reach further each repetition   L ER stretch at neutral on wall, moved to seated with cane to improve anterior focus of stretch 20x5    Manual therapy: 15 minutes  L GH mobs inferior and posterior at ~80 deg abduction/supine gr. 2-3 5x30 each  L shoulder PROM into flexion, abduction, ER    Total treatment time: 45 min      I attest that I have reviewed the above information.  Signed: Verneita Griffes, PT  02/11/2021 4:06 PM

## 2021-02-19 NOTE — Unmapped (Signed)
[facility] Refill Coordination Note    Specialty Medication(s) to be Shipped:   Transplant: Envarsus 0.75mg     Other medication(s) to be shipped: No additional medications requested for fill at this time     Louis Dennis, DOB: 29-Nov-1961  Phone: [phone] (home)       All above HIPAA information was verified with patient.     Was a Nurse, learning disability used for this call? No    Completed refill call assessment today to schedule patient's medication shipment from the [facility] [phone]).       Specialty medication(s) and dose(s) confirmed: Regimen is correct and unchanged.   Changes to medications: Louis Dennis reports no changes at this time.  Changes to insurance: No  Questions for the pharmacist: No    Confirmed patient received Welcome Packet with first shipment. The patient will receive a drug information handout for each medication shipped and additional FDA Medication Guides as required.       DISEASE/MEDICATION-SPECIFIC INFORMATION        N/A    SPECIALTY MEDICATION ADHERENCE     Medication Adherence    Patient reported X missed doses in the last month: 0  Specialty Medication: envarsus 0.75mg   Patient is on additional specialty medications: No  Patient is on more than two specialty medications: No  Any gaps in refill history greater than 2 weeks in the last 3 months: no  Demonstrates understanding of importance of adherence: yes  Informant: patient  Reliability of informant: reliable  Provider-estimated medication adherence level: good  Patient is at risk for Non-Adherence: No                envarsus 0.75 mg: 5 days of medicine on hand         SHIPPING     Shipping address confirmed in Epic.     Delivery Scheduled: Yes, Expected medication delivery date: 03/21.     Medication will be delivered via Same Day Courier to the prescription address in Epic WAM.    Louis Dennis   [facility] Specialty Technician

## 2021-02-22 MED FILL — ENVARSUS XR 0.75 MG TABLET,EXTENDED RELEASE: ORAL | 30 days supply | Qty: 60 | Fill #3

## 2021-02-24 NOTE — Unmapped (Signed)
[facility] Refill Coordination Note    Specialty Medication(s) to be Shipped:   Transplant: Envarsus 1mg  and Myfortic 180mg     Other medication(s) to be shipped:    omeprazole, metoprolol, magnesium and allopurinol       Louis Dennis, DOB: 29-Apr-1961  Phone: [phone] (home)       All above HIPAA information was verified with patient.     Was a Nurse, learning disability used for this call? No    Completed refill call assessment today to schedule patient's medication shipment from the [facility] [phone]).       Specialty medication(s) and dose(s) confirmed: Regimen is correct and unchanged.   Changes to medications: Tra reports no changes at this time.  Changes to insurance: No  Questions for the pharmacist: No    Confirmed patient received a Conservation officer, historic buildings and a Surveyor, mining with first shipment. The patient will receive a drug information handout for each medication shipped and additional FDA Medication Guides as required.       DISEASE/MEDICATION-SPECIFIC INFORMATION        N/A    SPECIALTY MEDICATION ADHERENCE     Medication Adherence    Patient reported X missed doses in the last month: 0  Specialty Medication: tacrolimus (ENVARSUS XR) 1 mg Tb24 extended release tablet  Patient is on additional specialty medications: Yes  Additional Specialty Medications: tacrolimus (ENVARSUS XR) 4 mg Tb24 extended release tablet  Patient Reported Additional Medication X Missed Doses in the Last Month: 0  Patient is on more than two specialty medications: Yes  Specialty Medication: MYFORTIC 180 mg EC tablet  Patient Reported Additional Medication X Missed Doses in the Last Month: 0                   Myfortic 180 mg: 9 days of medicine on hand   Envarsus 1 mg: 9 days of medicine on hand         SHIPPING     Shipping address confirmed in Epic.     Delivery Scheduled: Yes, Expected medication delivery date: 03/03/21.     Medication will be delivered via Same Day Courier to the prescription address in Epic WAM.    Louis Dennis   [facility] Specialty Technician

## 2021-02-25 ENCOUNTER — Ambulatory Visit
Admit: 2021-02-25 | Discharge: 2021-03-20 | Payer: MEDICARE | Attending: Rehabilitative and Restorative Service Providers" | Primary: Rehabilitative and Restorative Service Providers"

## 2021-02-25 NOTE — Unmapped (Signed)
 [facility] [place]  OUTPATIENT PHYSICAL THERAPY  02/25/2021  Note Type: Treatment Note       Patient Name: Louis Dennis  Date of Birth:[birth year]  Diagnosis:   Encounter Diagnoses   Name Primary?   ??? Chronic right shoulder pain Yes   ??? Chronic left shoulder pain    ??? Chronic pain of left knee      Referring MD:  Leeroy Bock, MD     Date of Onset of Impairment-Chronic (about a year ago)  Date PT Care Plan Established or Reviewed-01/20/2021  Date PT Treatment Started- 01/20/2021  Plan of Care Effective Date: 01/20/2021 - 04/29/2021   Visit: 5    Assessment/Plan:    Assessment details:   Pt presents for f/u following two week break from PT with good maintenance of flexion AROM and improvement is B ER since last appt. Mobilizations were continued which see modest improvement in ROM and HEP was reviewed with plan to extend next follow up to 3 weeks given good adherence to HEP and continued painfree ADLs. Of note, pt did experience a fall since last appt which he sustained a superficial laceration of R knee and some bruising of R palmar wrist. [place] knee rules were assessed with pt meeting criteria for age. However, as other criteria is negative, extensor mechanism is not affected or painful, and > 7 days after injury, pt is unlikely to need radiographs.    Therapy Goals  Goals:      In 12 weeks:  1. Pt will be compliant and independent with HEP to aid in self-management of symptoms.  2. Pt will achieve shoulder AROM WNL in all planes for full mobility during reaching and dressing tasks.  3. Pt's pain will decrease to 1/10 at all times to enable performance of daily activities with less pain.  4. Pt will be able to reaching overhead as well as squat without pain to return to PLOF.  Plan  Therapy options: will be seen for skilled physical therapy services  Planned therapy interventions: body mechanics training, education - patient, functional mobility, home exercise program, therapeutic exercises, therapeutic activities, postural training, manual therapy and neuromuscular re-education    Frequency: 1x week  Duration in weeks: up to 12 weeks  Education provided to: patient.  Education provided: anatomy, HEP, indications/contraindications to exercises, role of therapy in Rehabilitation, treatment options and plan, self-soft tissue mobilization, importance of Therapy and body awareness  Education results: needs reinforcement and needs further instruction.    Subjective:   History of Present Illness  Date of Onset: 01/21/2020    Date of Evaluation: 01/20/2021    Reason for Referral/Chief Complaint:       Shoulder pain R and L, as well as L knee pain.  Subjective:   Pt reports that two days after last appt, he had a fall onto his R knee and hand when he tripped over a root. Pt was able to immediately bear weight on RLE and noted pain with squatting and lateral band walks initially so he has stayed away from LE exercise since fall. Took a week off shoulder exercises and then resumed normal shoulder HEP. Continues to note not shoulder pain with normal everyday activity, with exception of LUE elevation when donning shirts.       Pain  Current pain rating: 0  At best pain rating: 0  At worst pain rating: 2    Patient Goals  Patient goals for therapy: improved ambulation, increased ROM, increased strength, decreased  pain and return to recreational activites      Objective:   Objective    AROM:  Shoulder:  Flexion: R: 155-->164, L: 146-->152  Abduction: NT    PROM:  Shoulder:  ER: R: 76-->76 , L: 64 --> 73    Accessory motion testing:  Shoulder: decreased posterior and inferior mobility (L>R)    [place] knee rules  1 Age >55 years: +  2 Isolated patellar tenderness without other bone tenderness: -  3 Tenderness of the fibular head: -  4 Inability to flex the knee to 90??: -  5 Inability to bear weight immediately after injury and in the emergency department (4 steps) regardless of limping: -    (-) TTP to R scaphoid, lunate, capitate    Treatment rendered today:  Therapeutic exercises: 30 minutes  Education on dosing LE/UE exercises, posture and importance of changing positions often, allowable pain with exercise  Supine cane flexion 15x10, 10x10 with 3# ankle weight added, able to perform up to 160 deg flexion with 2-3/10 pain  Wall angels for abduction, 20x, cueing to reach further each repetition   Review of LE HEP, squats, HR; educating pt allowable to perform as this does not hurt RLE today    Manual therapy: 15 minutes  L GH mobs inferior and posterior at ~80 deg abduction/supine gr. 2-3 5x30 each  L shoulder PROM into flexion, abduction, ER    Total treatment time: 45 min      I attest that I have reviewed the above information.  Signed: Verneita Griffes, PT  02/25/2021 11:14 AM    This care was provided during physical therapy residency mentored training.  I was present throughout and participated in the examination, assessment, treatment and plan of care.  Clent Demark Lucila Maine, PT  February 28, 2021 9:39 AM

## 2021-03-03 MED FILL — MYFORTIC 180 MG TABLET,DELAYED RELEASE: ORAL | 30 days supply | Qty: 180 | Fill #1

## 2021-03-03 MED FILL — MG-PLUS-PROTEIN 133 MG TABLET: ORAL | 30 days supply | Qty: 60 | Fill #7

## 2021-03-03 MED FILL — ENVARSUS XR 1 MG TABLET,EXTENDED RELEASE: ORAL | 30 days supply | Qty: 30 | Fill #3

## 2021-03-03 MED FILL — METOPROLOL SUCCINATE ER 25 MG TABLET,EXTENDED RELEASE 24 HR: ORAL | 30 days supply | Qty: 60 | Fill #3

## 2021-03-03 MED FILL — ALLOPURINOL 100 MG TABLET: ORAL | 30 days supply | Qty: 30 | Fill #8

## 2021-03-03 MED FILL — OMEPRAZOLE 20 MG CAPSULE,DELAYED RELEASE: ORAL | 30 days supply | Qty: 30 | Fill #1

## 2021-03-06 MED ORDER — CHLORHEXIDINE GLUCONATE 0.12 % MOUTHWASH
0 days
Start: 2021-03-06 — End: ?

## 2021-03-06 MED ORDER — DENTA 5000 PLUS 1.1 % CREAM
Freq: Every day | 0 days
Start: 2021-03-06 — End: ?

## 2021-03-10 ENCOUNTER — Ambulatory Visit: Admit: 2021-03-10 | Discharge: 2021-03-11 | Payer: MEDICARE

## 2021-03-10 LAB — BASIC METABOLIC PANEL
ANION GAP: 8 mmol/L (ref 5–14)
BLOOD UREA NITROGEN: 16 mg/dL (ref 9–23)
BUN / CREAT RATIO: 25
CALCIUM: 9.2 mg/dL (ref 8.7–10.4)
CHLORIDE: 106 mmol/L (ref 98–107)
CO2: 25 mmol/L (ref 20.0–31.0)
CREATININE: 0.65 mg/dL
EGFR CKD-EPI AA MALE: >90 mL/min/{1.73_m2} (ref >=60)
EGFR CKD-EPI NON-AA MALE: >90 mL/min/{1.73_m2} (ref >=60)
GLUCOSE RANDOM: 135 mg/dL (ref 70–179)
POTASSIUM: 4.1 mmol/L (ref 3.4–4.8)
SODIUM: 139 mmol/L (ref 135–145)

## 2021-03-10 LAB — CBC W/ AUTO DIFF
BASOPHILS ABSOLUTE COUNT: 0 10*9/L (ref 0.0–0.1)
BASOPHILS RELATIVE PERCENT: 1.2 %
EOSINOPHILS ABSOLUTE COUNT: 0.4 10*9/L (ref 0.0–0.5)
EOSINOPHILS RELATIVE PERCENT: 9.4 %
HEMATOCRIT: 42.5 % (ref 39.0–48.0)
HEMOGLOBIN: 14.1 g/dL (ref 12.9–16.5)
LYMPHOCYTES ABSOLUTE COUNT: 0.6 10*9/L — ABNORMAL LOW (ref 1.1–3.6)
LYMPHOCYTES RELATIVE PERCENT: 13.1 %
MEAN CORPUSCULAR HEMOGLOBIN CONC: 33.2 g/dL (ref 32.0–36.0)
MEAN CORPUSCULAR HEMOGLOBIN: 30.6 pg (ref 25.9–32.4)
MEAN CORPUSCULAR VOLUME: 92.1 fL (ref 77.6–95.7)
MEAN PLATELET VOLUME: 9.2 fL (ref 6.8–10.7)
MONOCYTES ABSOLUTE COUNT: 0.4 10*9/L (ref 0.3–0.8)
MONOCYTES RELATIVE PERCENT: 8.9 %
NEUTROPHILS ABSOLUTE COUNT: 2.9 10*9/L (ref 1.8–7.8)
NEUTROPHILS RELATIVE PERCENT: 67.4 %
NUCLEATED RED BLOOD CELLS: 0 /100{WBCs} (ref <=4)
PLATELET COUNT: 171 10*9/L (ref 150–450)
RED BLOOD CELL COUNT: 4.61 10*12/L (ref 4.26–5.60)
RED CELL DISTRIBUTION WIDTH: 14.8 % (ref 12.2–15.2)
WBC ADJUSTED: 4.3 10*9/L (ref 3.6–11.2)

## 2021-03-10 LAB — PHOSPHORUS: PHOSPHORUS: 2.9 mg/dL (ref 2.4–5.1)

## 2021-03-10 LAB — TACROLIMUS LEVEL, TROUGH: TACROLIMUS, TROUGH: 7.1 ng/mL (ref 5.0–15.0)

## 2021-03-10 LAB — MAGNESIUM: MAGNESIUM: 2 mg/dL (ref 1.6–2.6)

## 2021-03-15 DIAGNOSIS — D849 Immunodeficiency, unspecified: Principal | ICD-10-CM

## 2021-03-15 DIAGNOSIS — Z940 Kidney transplant status: Principal | ICD-10-CM

## 2021-03-15 DIAGNOSIS — Z9189 Other specified personal risk factors, not elsewhere classified: Principal | ICD-10-CM

## 2021-03-15 DIAGNOSIS — R82998 Other abnormal findings in urine: Principal | ICD-10-CM

## 2021-03-17 ENCOUNTER — Ambulatory Visit: Admit: 2021-03-17 | Discharge: 2021-03-17 | Payer: MEDICARE

## 2021-03-17 ENCOUNTER — Ambulatory Visit: Admit: 2021-03-17 | Discharge: 2021-03-17 | Payer: MEDICARE | Attending: Nephrology | Primary: Nephrology

## 2021-03-17 DIAGNOSIS — R82998 Other abnormal findings in urine: Principal | ICD-10-CM

## 2021-03-17 DIAGNOSIS — Z940 Kidney transplant status: Principal | ICD-10-CM

## 2021-03-17 DIAGNOSIS — Z9189 Other specified personal risk factors, not elsewhere classified: Principal | ICD-10-CM

## 2021-03-17 DIAGNOSIS — D849 Immunodeficiency, unspecified: Principal | ICD-10-CM

## 2021-03-17 LAB — URINALYSIS
BILIRUBIN UA: NEGATIVE
BLOOD UA: NEGATIVE
GLUCOSE UA: NEGATIVE
LEUKOCYTE ESTERASE UA: NEGATIVE
NITRITE UA: NEGATIVE
PH UA: 5.5 (ref 5.0–9.0)
PROTEIN UA: NEGATIVE
RBC UA: <1 /HPF (ref <3)
SPECIFIC GRAVITY UA: 1.015 (ref 1.005–1.030)
SQUAMOUS EPITHELIAL: <1 /HPF (ref 0–5)
UROBILINOGEN UA: 0.2
WBC UA: <1 /HPF (ref <2)

## 2021-03-17 LAB — PROTEIN / CREATININE RATIO, URINE
CREATININE, URINE: 65.9 mg/dL
PROTEIN URINE: 6.5 mg/dL
PROTEIN/CREAT RATIO, URINE: 0.099

## 2021-03-17 MED ORDER — METOPROLOL SUCCINATE ER 25 MG TABLET,EXTENDED RELEASE 24 HR
ORAL_TABLET | Freq: Every evening | ORAL | 11 refills | 30 days | Status: CP
Start: 2021-03-17 — End: 2022-03-17
  Filled 2021-03-31: qty 30, 30d supply, fill #0

## 2021-03-17 NOTE — Unmapped (Signed)
Transplant Nephrology Clinic Visit    History of Present Illness  [name] is a 60 y.o. 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  -Adequate perfusion of the transplant kidney. Resistive indices within the mid main renal artery and at the anastomosis are slightly elevated. Attention on follow-up.  ??  Current Immunosuppression Regimen:   - Myfortic 540mg  BID  - Envarsus 2.5 mg every day    Subjective/Interval:     Since patient's last visit in the transplant clinic - patient has been doing well in terms of transplant, taking transplant medications regularly, no episodes of rejection and no side effects of medications.    Patient continues to follow with family medicine and physical therapy.     Patient presents today feeling overall well. He has been following PT every 3 weeks and his right shoulder pain and ROM have significantly improved.  Appetite is normal. Denies chest pain or tightness, dysuria or lower extremity swelling. Home BS running in the 100's-120's and he continues to take Metformin 500 mg every PM.     Last took Envarsus 7:30 am.     Review of Systems  Otherwise as per HPI, all other systems reviewed and are negative.    Medications  Current Outpatient Medications   Medication Sig Dispense Refill   ??? allopurinoL (ZYLOPRIM) 100 MG tablet Take 1 tablet (100 mg total) by mouth daily. (Patient taking differently: Take 100 mg by mouth every other day. ) 30 tablet 11   ??? biotin 5 mg cap Take 1 capsule (5,000 mcg total) by mouth daily. (Patient taking differently: Take 5 mg by mouth every other day. )  0   ??? magnesium oxide-Mg AA chelate (MAGNESIUM, AMINO ACID CHELATE,) 133 mg Tab Take 1 tablet by mouth Two (2) times a day. 60 tablet 11   ??? metFORMIN (GLUCOPHAGE-XR) 500 MG 24 hr tablet TAKE 1 TABLET (500 MG TOTAL) BY MOUTH DAILY WITH EVENING MEAL. 90 tablet 3   ??? metoprolol succinate (TOPROL XL) 25 MG 24 hr tablet Take 2 tablets (50 mg total) by mouth nightly. (Patient taking differently: Take 25 mg by mouth nightly. ) 60 tablet 11   ??? MYFORTIC 180 mg EC tablet Take 3 tablets (540 mg total) by mouth Two (2) times a day. 180 tablet 11   ??? omeprazole (PRILOSEC) 20 MG capsule Take 1 capsule (20 mg total) by mouth daily. 30 capsule 11   ??? tacrolimus (ENVARSUS XR) 0.75 mg Tb24 extended release tablet Take two (0.75mg ) tablets with one 1mg  tablet every morning to equal 2.5mg  daily dose 60 tablet 11   ??? tacrolimus (ENVARSUS XR) 1 mg Tb24 extended  release tablet Take one (1mg ) tablet with two (0.75mg ) tablets by mouth every morning to equal 2.5mg  daily dose. 30 tablet 11   ??? triamcinolone (KENALOG) 0.1 % cream Apply topically Two (2) times a day. 80 g 1     No current facility-administered medications for this visit.     Physical Exam  BP 117/77 (BP Site: R Arm, BP Position: Sitting, BP Cuff Size: Large)  - Pulse 68  - Temp 36.1 ??C (97 ??F) (Temporal)  - Ht 160 cm (5' 2.99)  - Wt 65 kg (143 lb 6.4 oz)  - BMI 25.41 kg/m??   General: Well appearing male, no acute distress  HEENT: wearing mask  Neck: wearing mask  CV: normal rate, normal rhythm, no murmur, no rubs appreciated  Lungs: clear to auscultation bilaterally, normal work of breathing.  Abdomen: soft, non tender.  Extremities: no edema  Musculoskeletal: normal range of motion left shoulder, improved ROM right shoulder.  Pulses: intact distally throughout  Neurologic: awake, alert, and oriented x3    Laboratory Data and Imaging reviewed in EPIC    Assessment: [name] is a 60 y.o. 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     Recommendations/Plan:   Allograft Function: renal function stable at 0.65 with a baseline of approximately 0.7-0.9 since transplant. Will continue to monitor.  U.A - 0.099 protein/creatinine ratio.    Immunosuppression Management [High Risk Medical Decision Making For Drug Therapy Requiring Intensive Monitoring For Toxicity]:  Continue Envarsus 2.5 mg daily. Targeting tacrolimus trough level of approximately 6-8.  Continue Myfortic 540 mg BID. Tacrolimus trough 7.1 ng/mL (03/10/21)    Blood Pressure Management: BP 117/77 at this visit. Patient currently on Toprol-XL 25 mg at night.    Pre-diabetes: Currently on metformin 500 mg daily. Home BG overall controlled. Last A1c was 5.5 in 09/2020.Will check BG at next visit, if it remains controlled will discontinue metformin.    Hypercalcemia: Calcium 9.2 (03/10/21)  - s/p parathyroid surgery - 11/11/2019    Gout: no flare or activity since ~2016    Lower Back Pain with midline radiculopathy: Known underlying degenerative changes. Resolved per Patient.    Left Knee & Right Shoulder Pain: (06/12/20)  - Patient following with PT every 3 weeks for shoulder pain. Notes improved pain and ROM.     Health Maintenance:   - Colonoscopy due 2029  - Blood work once a month    Immunizations:  - Vaccination: PCV-23 completed 07/06/2015.   - Flu shot: 08/31/20  - Prevnar 13 to be given at 1 year post  - Covid vaccine (Moderna): 04/12/20, 08/15/20, 09/06/20. Receiving 4th dose today. Counseled patient on the benefits of Evusheld antibody for when it becomes available to him.      Follow-Up:  Return to clinic in 3 months    Scribe's Attestation: Leeroy Bock, MD obtained and performed the history, physical exam and medical decision making elements that were entered into the chart.  Signed by Sharion Balloon, Scribe, on March 17, 2021 at 9:18 AM.    Documentation assistance provided by the Scribe. I was present during the time the encounter was recorded. The information recorded by the Scribe was done at my direction and has been reviewed and validated by me.

## 2021-03-17 NOTE — Unmapped (Signed)
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          03/17/21 0840   BP: 117/77   Pulse: 68   Temp: 36.1 ??C (97 ??F)   Weight: 65 kg (143 lb 6.4 oz)   Height: 160 cm (5' 2.99)   PainSc: 0-No pain       Assessment  BP: 100-115/60-70s at home   Lightheaded: denies  BG: 110-120, checks daily  Headache: denies  Hand tremors: denies  Numbness/tingling: denies  Fevers: denies  Chills/sweats: denies  Shortness of breath: denies  Chest pain or pressure: denies  Palpitations: denies  Abdominal pain: denies  Heart burn: denies  Nausea/vomiting: denies  Diarrhea/constipation: denies  UTI symptoms: denies  Swelling: denies  Pain: with larger motions of L shoulder and L knee, states PT is helping     Good appetite, eating 3 meals per day ; reports adequate hydration.     Intake: 2-2.4 liters per day    Any new medications? no  Immunosuppressant last taken: 7:30am the day before labs on Monday     Immunization status: needs to receive 4th dose COVID-19 vaccine     Functional Score: 90     Able to carry on normal activity;  Minor signs or symptoms of disease.    I spent a total of 10 minutes with Louis Dennis reviewing medications and symptoms.

## 2021-03-17 NOTE — Unmapped (Signed)
Pt received booster Covid-19 dose Moderna. Observed pt for 15 mins, pt displayed no signs of allergic reactions and no c/o of discomfort. Pt is aware if any changes occur to seek medical attention.

## 2021-03-22 NOTE — Unmapped (Signed)
[facility] Refill Coordination Note    Specialty Medication(s) to be Shipped:   Transplant: Envarsus 0.75mg , Envarsus 1mg  and Myfortic 180mg     Other medication(s) to be shipped: magnesium, metoprolol, omeprazole and allopurinol     Louis Dennis, DOB: 1961/03/20  Phone: [phone] (home)       All above HIPAA information was verified with patient.     Was a Nurse, learning disability used for this call? Yes, Louis Dennis. Patient language is appropriate in [facility]    Completed refill call assessment today to schedule patient's medication shipment from the [facility] [phone]).  All relevant notes have been reviewed.     Specialty medication(s) and dose(s) confirmed: Regimen is correct and unchanged.   Changes to medications: Louis Dennis reports no changes at this time.  Changes to insurance: No  New side effects reported not previously addressed with a pharmacist or physician: None reported  Questions for the pharmacist: No    Confirmed patient received a Conservation officer, historic buildings and a Surveyor, mining with first shipment. The patient will receive a drug information handout for each medication shipped and additional FDA Medication Guides as required.       DISEASE/MEDICATION-SPECIFIC INFORMATION        N/A    SPECIALTY MEDICATION ADHERENCE     Medication Adherence    Patient reported X missed doses in the last month: 1  Specialty Medication: Envarsus 0.75mg   Patient is on additional specialty medications: Yes  Additional Specialty Medications: Envarsus 1mg   Patient Reported Additional Medication X Missed Doses in the Last Month: 1  Patient is on more than two specialty medications: Yes  Specialty Medication: Myfortic 180mg   Patient Reported Additional Medication X Missed Doses in the Last Month: 1        Were doses missed due to medication being on hold? No    Envarsus 0.75 mg: 4 days of medicine on hand   Envarsus 1 mg: 12 days of medicine on hand   Myfortic 180mg  mg: 12 days of medicine on hand    REFERRAL TO PHARMACIST     Referral to the pharmacist: Not needed      SHIPPING     Shipping address confirmed in Epic.     Delivery Scheduled: Yes, Expected medication delivery date: 04/01/2021.      **Envarsus 0.75mg  delivery on 03/24/2021**     Medication will be delivered via UPS to the prescription address in Epic WAM.    Louis Dennis [facility] Specialty Technician

## 2021-03-23 MED FILL — ENVARSUS XR 0.75 MG TABLET,EXTENDED RELEASE: ORAL | 30 days supply | Qty: 60 | Fill #4

## 2021-03-24 DIAGNOSIS — Z940 Kidney transplant status: Principal | ICD-10-CM

## 2021-03-24 DIAGNOSIS — D849 Immunodeficiency, unspecified: Principal | ICD-10-CM

## 2021-03-25 ENCOUNTER — Ambulatory Visit
Admit: 2021-03-25 | Discharge: 2021-04-19 | Payer: MEDICARE | Attending: Rehabilitative and Restorative Service Providers" | Primary: Rehabilitative and Restorative Service Providers"

## 2021-03-25 NOTE — Unmapped (Signed)
[facility] PT [facility] [place]  OUTPATIENT PHYSICAL THERAPY  03/25/2021  Note Type: Treatment Note       Patient Name: Louis Dennis  Date of Birth:01-30-1961  Diagnosis:   Encounter Diagnoses   Name Primary?   ??? Chronic right shoulder pain Yes   ??? Chronic left shoulder pain      Referring MD:  Leeroy Bock, MD     Date of Onset of Impairment-Chronic (about a year ago)  Date PT Care Plan Established or Reviewed-01/20/2021  Date PT Treatment Started- 01/20/2021  Plan of Care Effective Date: 01/20/2021 - 04/29/2021   Visit: 6    Assessment/Plan:    Assessment details:   Pt presents with good carryover and improvement of R shoulder ROM with slight regression of L shoulder ROM inflexion. Following review of HEP and brief manual interventions, pt was able to regain all L shoulder AROM. As pt is consistent with HEP and notes no current pain with daily activities, pt to be discharged to independent HEP performance with education provided on frequency of HEP.     Therapy Goals  Goals:      In 12 weeks:  1. Pt will be compliant and independent with HEP to aid in self-management of symptoms.  2. Pt will achieve shoulder AROM WNL in all planes for full mobility during reaching and dressing tasks.  3. Pt's pain will decrease to 1/10 at all times to enable performance of daily activities with less pain.  4. Pt will be able to reaching overhead as well as squat without pain to return to PLOF.  Plan  Therapy options: will be seen for skilled physical therapy services  Planned therapy interventions: body mechanics training, education - patient, functional mobility, home exercise program, therapeutic exercises, therapeutic activities, postural training, manual therapy and neuromuscular re-education    Frequency: 1x week  Duration in weeks: up to 12 weeks  Education provided to: patient.  Education provided: anatomy, HEP, indications/contraindications to exercises, role of therapy in Rehabilitation, treatment options and plan, self-soft tissue mobilization, importance of Therapy and body awareness  Education results: needs reinforcement and needs further instruction.    Subjective:   History of Present Illness  Date of Onset: 01/21/2020    Date of Evaluation: 01/20/2021    Reason for Referral/Chief Complaint:       Shoulder pain R and L, as well as L knee pain.  Subjective:   Pt reports that he had his 4th CoVID shot last week and felt some arm soreness so cancelled last appt. Feels shoulder are doing well and is confident with discharge today.       Pain  Current pain rating: 0  At best pain rating: 0  At worst pain rating: 1    Patient Goals  Patient goals for therapy: improved ambulation, increased ROM, increased strength, decreased pain and return to recreational activites      Objective:   Objective    AROM:  Shoulder:  Flexion: R: 170-->164, L: 140-->156  Abduction: NT    PROM:  Shoulder:  ER: R: 76-->76 , L: 60 --> 73    Accessory motion testing:  Shoulder: decreased posterior and inferior mobility (L>R)    Treatment rendered today:  Therapeutic exercises: 20 minutes  Education on dosing LE/UE exercises, posture and importance of changing positions often, allowable pain with exercise  Cross body posterior capsule stretch 10x10  Wall angels for abduction, 20x, with back against wall today  Review of UE HEP  Manual therapy: 10 minutes  L GH mobs inferior and posterior at ~80 deg abduction/supine gr. 2-3 5x30 each  L shoulder PROM into flexion, abduction, ER    Total treatment time: 30 min      I attest that I have reviewed the above information.  Signed: Verneita Griffes, PT  03/25/2021 2:31 PM

## 2021-03-31 MED FILL — MYFORTIC 180 MG TABLET,DELAYED RELEASE: ORAL | 30 days supply | Qty: 180 | Fill #2

## 2021-03-31 MED FILL — ENVARSUS XR 1 MG TABLET,EXTENDED RELEASE: ORAL | 30 days supply | Qty: 30 | Fill #4

## 2021-03-31 MED FILL — MG-PLUS-PROTEIN 133 MG TABLET: ORAL | 30 days supply | Qty: 60 | Fill #8

## 2021-03-31 MED FILL — OMEPRAZOLE 20 MG CAPSULE,DELAYED RELEASE: ORAL | 30 days supply | Qty: 30 | Fill #2

## 2021-03-31 MED FILL — ALLOPURINOL 100 MG TABLET: ORAL | 30 days supply | Qty: 30 | Fill #9

## 2021-04-08 ENCOUNTER — Ambulatory Visit: Admit: 2021-04-08 | Discharge: 2021-04-09 | Payer: MEDICARE

## 2021-04-08 LAB — CBC W/ AUTO DIFF
BASOPHILS ABSOLUTE COUNT: 0.1 10*9/L (ref 0.0–0.1)
BASOPHILS RELATIVE PERCENT: 1.1 %
EOSINOPHILS ABSOLUTE COUNT: 0.4 10*9/L (ref 0.0–0.5)
EOSINOPHILS RELATIVE PERCENT: 7.8 %
HEMATOCRIT: 43.1 % (ref 39.0–48.0)
HEMOGLOBIN: 14.5 g/dL (ref 12.9–16.5)
LYMPHOCYTES ABSOLUTE COUNT: 0.6 10*9/L — ABNORMAL LOW (ref 1.1–3.6)
LYMPHOCYTES RELATIVE PERCENT: 11.7 %
MEAN CORPUSCULAR HEMOGLOBIN CONC: 33.5 g/dL (ref 32.0–36.0)
MEAN CORPUSCULAR HEMOGLOBIN: 31 pg (ref 25.9–32.4)
MEAN CORPUSCULAR VOLUME: 92.4 fL (ref 77.6–95.7)
MEAN PLATELET VOLUME: 9.1 fL (ref 6.8–10.7)
MONOCYTES ABSOLUTE COUNT: 0.4 10*9/L (ref 0.3–0.8)
MONOCYTES RELATIVE PERCENT: 8.2 %
NEUTROPHILS ABSOLUTE COUNT: 3.8 10*9/L (ref 1.8–7.8)
NEUTROPHILS RELATIVE PERCENT: 71.2 %
NUCLEATED RED BLOOD CELLS: 0 /100{WBCs} (ref <=4)
PLATELET COUNT: 193 10*9/L (ref 150–450)
RED BLOOD CELL COUNT: 4.67 10*12/L (ref 4.26–5.60)
RED CELL DISTRIBUTION WIDTH: 14.2 % (ref 12.2–15.2)
WBC ADJUSTED: 5.4 10*9/L (ref 3.6–11.2)

## 2021-04-08 LAB — BASIC METABOLIC PANEL
ANION GAP: 8 mmol/L (ref 5–14)
BLOOD UREA NITROGEN: 19 mg/dL (ref 9–23)
BUN / CREAT RATIO: 28
CALCIUM: 9.1 mg/dL (ref 8.7–10.4)
CHLORIDE: 107 mmol/L (ref 98–107)
CO2: 24.5 mmol/L (ref 20.0–31.0)
CREATININE: 0.67 mg/dL
EGFR CKD-EPI AA MALE: >90 mL/min/{1.73_m2} (ref >=60)
EGFR CKD-EPI NON-AA MALE: >90 mL/min/{1.73_m2} (ref >=60)
GLUCOSE RANDOM: 139 mg/dL (ref 70–179)
POTASSIUM: 4.4 mmol/L (ref 3.5–5.1)
SODIUM: 139 mmol/L (ref 135–145)

## 2021-04-08 LAB — PHOSPHORUS: PHOSPHORUS: 2.5 mg/dL (ref 2.4–5.1)

## 2021-04-08 LAB — MAGNESIUM: MAGNESIUM: 1.8 mg/dL (ref 1.6–2.6)

## 2021-04-08 LAB — TACROLIMUS LEVEL, TROUGH: TACROLIMUS, TROUGH: 6.9 ng/mL (ref 5.0–15.0)

## 2021-04-19 MED ORDER — MG-PLUS-PROTEIN 133 MG TABLET
ORAL_TABLET | Freq: Two times a day (BID) | ORAL | 11 refills | 30.00000 days | Status: CN
Start: 2021-04-19 — End: 2022-04-19

## 2021-04-19 NOTE — Unmapped (Addendum)
[facility] Clinical Assessment & Refill Coordination Note    Louis Dennis, [place]: October 30, 1961  Phone: [phone] (home)     All above HIPAA information was verified with patient.     Was a Nurse, learning disability used for this call? Yes, Esther. Patient language is appropriate in [facility]    Specialty Medication(s):   Transplant: Envarsus 0.75mg , Envarsus 1mg  and  mycophenolic acid 180mg      Current Outpatient Medications   Medication Sig Dispense Refill   ??? allopurinoL (ZYLOPRIM) 100 MG tablet Take 1 tablet (100 mg total) by mouth daily. (Patient taking differently: Take 100 mg by mouth every other day. ) 30 tablet 11   ??? biotin 5 mg cap Take 1 capsule (5,000 mcg total) by mouth daily. (Patient taking differently: Take 5 mg by mouth every other day. )  0   ??? magnesium oxide-Mg AA chelate (MAGNESIUM, AMINO ACID CHELATE,) 133 mg Tab Take 1 tablet by mouth Two (2) times a day. 60 tablet 11   ??? metFORMIN (GLUCOPHAGE-XR) 500 MG 24 hr tablet TAKE 1 TABLET (500 MG TOTAL) BY MOUTH DAILY WITH EVENING MEAL. 90 tablet 3   ??? metoprolol succinate (TOPROL XL) 25 MG 24 hr tablet Take 1 tablet (25 mg total) by mouth nightly. 30 tablet 11   ??? MYFORTIC 180 mg EC tablet Take 3 tablets (540 mg total) by mouth Two (2) times a day. 180 tablet 11   ??? omeprazole (PRILOSEC) 20 MG capsule Take 1 capsule (20 mg total) by mouth daily. 30 capsule 11   ??? tacrolimus (ENVARSUS XR) 0.75 mg Tb24 extended release tablet Take two (0.75mg ) tablets with one 1mg  tablet every morning to equal 2.5mg  daily dose 60 tablet 11   ??? tacrolimus (ENVARSUS XR) 1 mg Tb24 extended release tablet Take one (1mg ) tablet with two (0.75mg ) tablets by mouth every morning to equal 2.5mg  daily dose. 30 tablet 11   ??? triamcinolone (KENALOG) 0.1 % cream Apply topically Two (2) times a day. 80 g 1     No current facility-administered medications for this visit.        Changes to medications: Epic reports no changes at this time.    No Known Allergies    Changes to allergies: No    SPECIALTY MEDICATION ADHERENCE     Myfortic 180 mg: 8 days of medicine on hand   Envarsus Xr 0.75 mg: 8 days of medicine on hand   Envarsus Xr 1 mg: 8 days of medicine on hand       Medication Adherence    Patient reported X missed doses in the last month: 0  Specialty Medication: Myfortic 180mg   Patient is on additional specialty medications: Yes  Additional Specialty Medications: Envarsus Xr 0.75mg   Patient Reported Additional Medication X Missed Doses in the Last Month: 0  Patient is on more than two specialty medications: Yes  Specialty Medication: Envarsus Xr 1mg   Patient Reported Additional Medication X Missed Doses in the Last Month: 0          Specialty medication(s) dose(s) confirmed: Regimen is correct and unchanged.     Are there any concerns with adherence? No    Adherence counseling provided? Not needed    CLINICAL MANAGEMENT AND INTERVENTION      Clinical Benefit Assessment:    Do you feel the medicine is effective or helping your condition? Yes    Clinical Benefit counseling provided? Not needed    Adverse Effects Assessment:    Are you  experiencing any side effects? No    Are you experiencing difficulty administering your medicine? No    Quality of Life Assessment:    How many days over the past month did your kidney transplant  keep you from your normal activities? For example, brushing your teeth or getting up in the morning. 0    Have you discussed this with your provider? Not needed    Acute Infection Status:    Acute infections noted within Epic:  No active infections  Patient reported infection: None    Therapy Appropriateness:    Is therapy appropriate? Yes, therapy is appropriate and should be continued    DISEASE/MEDICATION-SPECIFIC INFORMATION      N/A    PATIENT SPECIFIC NEEDS     - Does the patient have any physical, cognitive, or cultural barriers? No    - Is the patient high risk? Yes, patient is taking a REMS drug. Medication is dispensed in compliance with REMS program    - Does the patient require a Care Management Plan? No     - Does the patient require physician intervention or other additional services (i.e. nutrition, smoking cessation, social work)? No      SHIPPING     Specialty Medication(s) to be Shipped:   Transplant: Envarsus 0.75mg , Envarsus 1mg  and Myfortic 180mg     Other medication(s) to be shipped: allopurinol, omeprazole, metoprolol, Mg     Changes to insurance: No    Delivery Scheduled: Yes, Expected medication delivery date: 04/23/21.     Medication will be delivered via Same Day Courier to the confirmed prescription address in [facility].    The patient will receive a drug information handout for each medication shipped and additional FDA Medication Guides as required.  Verified that patient has previously received a Conservation officer, historic buildings and a Surveyor, mining.    The patient or caregiver noted above participated in the development of this care plan and knows that they can request review of or adjustments to the care plan at any time.      All of the patient's questions and concerns have been addressed.    Tera Helper   [facility] Specialty Pharmacist

## 2021-04-22 ENCOUNTER — Ambulatory Visit: Admit: 2021-04-22 | Discharge: 2021-04-23 | Payer: MEDICARE | Attending: Family | Primary: Family

## 2021-04-22 DIAGNOSIS — I150 Renovascular hypertension: Principal | ICD-10-CM

## 2021-04-22 DIAGNOSIS — R7303 Prediabetes: Principal | ICD-10-CM

## 2021-04-22 NOTE — Unmapped (Signed)
Assessment and Plan:     Louis Dennis was seen today for hypertension, prediabetes and immunizations.    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 Continue metoprolol 25 mg HS. Reviewed low sodium diet and encouraged regular exercise. Advised to continue to monitor and log at-home BP readings.     Pre-diabetes  HGB A1c 5.8 (slightly up from 5.7 four months ago).   DM well controlled. Managed by nephrology. Continue metformin 500 mg daily.  Encouraged patient to continue carb controlled diet and regular exercise.   -     POCT glycosylated hemoglobin (Hb A1C); Future  -     POCT glycosylated hemoglobin (Hb A1C)    I personally spent 20 minutes face-to-face and non-face-to-face in the care of this patient, which includes all pre, intra, and post visit time on the date of service.    Return in about 4 months (around 08/23/2021) for Next scheduled follow up.    HPI:      Louis Dennis  is here for   Chief Complaint   Patient presents with   ??? Hypertension     Nonfasting.  No concerns.   ??? Prediabetes   ??? Immunizations     He will ask his kidney doctor about Prevnar 20.     Hypertension: Patient presents for follow-up of hypertension. Blood pressure goal < 140/90.  Hypertension has customarily been at goal complicated by ESRD.  Home blood pressure readings: 90-100s/60-65 in evenings, usually 100-110s systolic in AM, 106/67 this AM. Salt intake and diet: salt not added to cooking and salt shaker not on table. Associated signs and symptoms: none. Patient denies: blurred vision, chest pain, dyspnea, headache, neck aches, orthopnea, palpitations, paroxysmal nocturnal dyspnea, peripheral edema, pulsating in the ears and tiredness/fatigue. Medication compliance: taking as prescribed - pt takes medication at night. Nephrology manages medication. He is doing regular exercise.      He reports occasional elevated HR of 90-95 bpm while sleeping - usually when dreaming someone is following him. HR eventually returns to 70s after he wakes up.    Prediabetes: Patient presents for follow up of prediabetes.  A1C goal is <8.  Current symptoms include: none. Symptoms have been well-controlled. Patient denies foot ulcerations, hyperglycemia, hypoglycemia , increase appetite, nausea, paresthesia of the feet, polydipsia, polyuria, visual disturbances, vomitting and weight loss. Evaluation to date has included: hemoglobin A1C.  Home sugars: did not bring log. Current treatment: Continued metformin  which has been effective. Nephrology manages medication. Specialist noted at last follow up, 03/17/21, will check BG at next visit, if it remains controlled will discontinue metformin.     Patient has continued following with nephrology every 3 months. He reports symptoms are stable.       ROS:      Comprehensive 10 point ROS negative unless otherwise stated in the HPI.      [facility] Components:     Medication adherence and barriers to the treatment plan have been addressed. Opportunities to optimize healthy behaviors have been discussed. Patient / caregiver voiced understanding.    Past Medical/Surgical History:     Past Medical History:   Diagnosis Date   ??? Abnormal thyroid function test    ??? End stage renal disease (CMS-HCC)     now on peritoneal dialysis   ??? Gout     reports was in Right hand, foot and left elbow   ??? Hypertension  takes meds intermitttently based on readings   ??? Nephrolithiasis 2014   ??? Visual impairment     glasses     Past Surgical History:   Procedure Laterality Date   ??? OTHER SURGICAL HISTORY Right 08/2017    Pt had catheter moved from left side to right side.   ??? pd catheter  2012   ??? PR COLONOSCOPY W/BIOPSY SINGLE/MULTIPLE N/A 06/15/2018    Procedure: COLONOSCOPY, FLEXIBLE, PROXIMAL TO SPLENIC FLEXURE; WITH BIOPSY, SINGLE OR MULTIPLE;  Surgeon: Charm Rings, MD;  Location: [facility];  Service: Gastroenterology   ??? PR COLSC FLX W/RMVL OF TUMOR POLYP LESION SNARE TQ N/A 06/15/2018    Procedure: COLONOSCOPY FLEX; W/REMOV TUMOR/LES BY SNARE;  Surgeon: Charm Rings, MD;  Location: [facility];  Service: Gastroenterology   ??? PR EXPLORATORY OF ABDOMEN N/A 11/22/2018    Procedure: EXPLORATORY LAPAROTOMY, EXPLORATORY CELIOTOMY WITH OR WITHOUT BIOPSY(S);  Surgeon: Lawrence Marseilles Day Thurnell Lose, MD;  Location: MAIN OR [facility];  Service: Trauma   ??? PR EXPLORE PARATHYROID GLANDS N/A 11/11/2019    Procedure: RS 22 PARATHYROIDECTOMY OR EXPLORATION OF PARATHYROID(S);  Surgeon: Johny Chess, MD;  Location: MA[facility] OR [facility];  Service: Surgical Oncology   ??? PR FREEING BOWEL ADHESION,ENTEROLYSIS N/A 11/22/2018    Procedure: Enterolysis (Separt Proc);  Surgeon: Lawrence Marseilles Day Thurnell Lose, MD;  Location: MAIN OR [facility];  Service: Trauma   ??? PR REMOVE PERITONEAL FOREIGN BODY N/A 11/22/2018    Procedure: Removal Of Peritoneal Of Foreign Body From Peritoneal Cavity;  Surgeon: Lawrence Marseilles Day Thurnell Lose, MD;  Location: [facility];  Service: Trauma   ??? PR TRANSPLANT,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    Procedure: [facility] STD PREP CAD DONR RENAL ALLOGFT PRIOR TO TRNSPLNT, INCL DISSEC/REM PERINEPH FAT, DIAPH/RTPER ATTAC;  Surgeon: Doyce Loose, MD;  Location: MAIN OR [facility];  Service: Transplant   ??? PR TRANSPLANTATION OF KIDNEY N/A 05/24/2019    Procedure: RENAL ALLOTRANSPLANTATION, IMPLANTATION OF GRAFT; WITHOUT RECIPIENT NEPHRECTOMY;  Surgeon: Doyce Loose, MD;  Location: MAIN OR [facility];  Service: Transplant       Family History:     Family History   Problem Relation Age of Onset   ??? Hypertension Mother    ??? Kidney disease Mother    ??? Diabetes Mother    ??? Hypertension Father    ??? Kidney disease Sister         s/p transplant   ??? Hypertension Brother    ??? Glaucoma Neg Hx    ??? Amblyopia Neg Hx    ??? Blindness Neg Hx    ??? Retinal detachment Neg Hx    ??? Strabismus Neg Hx    ??? Macular degeneration Neg Hx    ??? Basal cell carcinoma Neg Hx    ??? Melanoma Neg Hx ??? Squamous cell carcinoma Neg Hx        Social History:     Social History     Tobacco Use   ??? Smoking status: Former Smoker     Packs/day: 1.00     Years: 30.00     Pack years: 30.00     Types: Cigarettes     Quit date: 07/10/2009     Years since quitting: 11.7   ??? Smokeless tobacco: Never Used   Vaping Use   ??? Vaping Use: Never used   Substance Use Topics   ??? Alcohol use: No     Alcohol/week: 0.0 standard drinks   ???  Drug use: No       Allergies:     Patient has no known allergies.    Current Medications:     Current Outpatient Medications   Medication Sig Dispense Refill   ??? allopurinoL (ZYLOPRIM) 100 MG tablet Take 1 tablet (100 mg total) by mouth daily. (Patient taking differently: Take 100 mg by mouth every other day. ) 30 tablet 11   ??? biotin 5 mg cap Take 1 capsule (5,000 mcg total) by mouth daily. (Patient taking differently: Take 5 mg by mouth every other day. )  0   ??? DENTA 5000 PLUS 1.1 % Crea daily.      ??? magnesium oxide-Mg AA chelate (MAGNESIUM, AMINO ACID CHELATE,) 133 mg Tab Take 1 tablet by mouth Two (2) times a day. 60 tablet 11   ??? metFORMIN (GLUCOPHAGE-XR) 500 MG 24 hr tablet TAKE 1 TABLET (500 MG TOTAL) BY MOUTH DAILY WITH EVENING MEAL. 90 tablet 3   ??? metoprolol succinate (TOPROL XL) 25 MG 24 hr tablet Take 1 tablet (25 mg total) by mouth nightly. 30 tablet 11   ??? MYFORTIC 180 mg EC tablet Take 3 tablets (540 mg total) by mouth Two (2) times a day. 180 tablet 11   ??? omeprazole (PRILOSEC) 20 MG capsule Take 1 capsule (20 mg total) by mouth daily. 30 capsule 11   ??? tacrolimus (ENVARSUS XR) 0.75 mg Tb24 extended release tablet Take two (0.75mg ) tablets with one 1mg  tablet every morning to equal 2.5mg  daily dose 60 tablet 11   ??? tacrolimus (ENVARSUS XR) 1 mg Tb24 extended release tablet Take one (1mg ) tablet with two (0.75mg ) tablets by mouth every morning to equal 2.5mg  daily dose. 30 tablet 11   ??? chlorhexidine (PERIDEX) 0.12 % solution PLEASE SEE ATTACHED FOR DETAILED DIRECTIONS (Patient not taking: Reported on 04/22/2021)     ??? triamcinolone (KENALOG) 0.1 % cream Apply topically Two (2) times a day. (Patient not taking: Reported on 04/22/2021) 80 g 1     No current facility-administered medications for this visit.       Health Maintenance:     Health Maintenance   Topic Date Due   ??? Pneumococcal Vaccine 0-64 (3 - PPSV23 or PCV20) 07/14/2020   ??? Lipid Screening  06/12/2025   ??? Colonoscopy  06/15/2028   ??? DTaP/Tdap/Td Vaccines (2 - Td or Tdap) 12/30/2029   ??? Hepatitis C Screen  Completed   ??? COVID-19 Vaccine  Completed   ??? Influenza Vaccine  Completed   ??? Zoster Vaccines  Discontinued       Immunizations:     Immunization History   Administered Date(s) Administered   ??? COVID-19 VACCINE,MRNA(MODERNA)(PF)(IM) 04/12/2020, 05/10/2020, 08/15/2020, 03/17/2021   ??? INFLUENZA INJ MDCK PF, QUAD,(FLUCELVAX)(22MO AND UP EGG FREE) 09/04/2017, 08/31/2020   ??? INFLUENZA TIV (TRI) PF (IM) 08/17/2011, 08/08/2012, 08/05/2013, 08/28/2014, 08/11/2015, 08/29/2015   ??? Influenza Vaccine Quad (IIV4 PF) 17mo+ injectable 08/16/2015, 09/06/2016, 09/11/2017, 08/06/2019   ??? Influenza Virus Vaccine, unspecified formulation 08/30/2016, 09/18/2017, 09/04/2018, 10/02/2018   ??? PNEUMOCOCCAL POLYSACCHARIDE 23 07/06/2015   ??? PPD Test 09/08/2011, 09/07/2012, 09/05/2013, 09/09/2014, 09/07/2015, 08/10/2016, 09/11/2017, 09/05/2018, 01/03/2019   ??? Pneumococcal Conjugate 13-Valent 07/24/2010, 07/15/2015   ??? TdaP 12/31/2019     I have reviewed and (if needed) updated the patient's problem list, medications, allergies, past medical and surgical history, social and family history.     Vital Signs:     Wt Readings from Last 3 Encounters:   04/22/21 64.4 kg (142 lb)  03/17/21 65 kg (143 lb 6.4 oz)   12/31/20 64.4 kg (142 lb)     Temp Readings from Last 3 Encounters:   04/22/21 36.8 ??C (98.2 ??F) (Oral)   03/17/21 36.1 ??C (97 ??F) (Temporal)   12/31/20 36.6 ??C (97.8 ??F) (Oral)     BP Readings from Last 3 Encounters:   04/22/21 102/64   03/17/21 117/77 12/31/20 98/66     Pulse Readings from Last 3 Encounters:   04/22/21 69   03/17/21 68   12/31/20 75     Estimated body mass index is 25.15 kg/m?? as calculated from the following:    Height as of this encounter: 160 cm (5' 3).    Weight as of this encounter: 64.4 kg (142 lb).  Facility age limit for growth percentiles is 20 years.      Objective:      General: Alert and oriented x3. Well-appearing. No acute distress.   HEENT:  Normocephalic.  Atraumatic. Conjunctiva and sclera normal. OP MMM without lesions.   Neck:  Supple. No thyroid enlargement. No adenopathy.   Heart:  Regular rate and rhythm. Normal S1, S2. No murmurs, rubs or gallops.   Lungs:  No respiratory distress.  Lungs clear to auscultation. No wheezes, rhonchi, or rales.   GI/GU:  Soft, +BS, nondistended, non-TTP.   Extremities:  No edema. Peripheral pulses normal.   Skin:  Warm, dry. No rash or lesions present.   Neuro:  Non-focal. No obvious weakness.   Psych:  Affect normal, eye contact good, speech clear and coherent.      I attest that I, Bayard Hugger, personally documented this note while acting as scribe for Noralyn Pick, FNP.      Bayard Hugger, Scribe.  04/22/2021     The documentation recorded by the scribe accurately reflects the service I personally performed and the decisions made by me.    Noralyn Pick, FNP

## 2021-04-23 DIAGNOSIS — D849 Immunodeficiency, unspecified: Principal | ICD-10-CM

## 2021-04-23 DIAGNOSIS — Z940 Kidney transplant status: Principal | ICD-10-CM

## 2021-04-23 MED ORDER — MG-PLUS-PROTEIN 133 MG TABLET
ORAL_TABLET | Freq: Two times a day (BID) | ORAL | 11 refills | 30 days | Status: CP
Start: 2021-04-23 — End: 2022-04-23
  Filled 2021-04-26: qty 60, 30d supply, fill #0

## 2021-04-26 MED FILL — MYFORTIC 180 MG TABLET,DELAYED RELEASE: ORAL | 30 days supply | Qty: 180 | Fill #3

## 2021-04-26 MED FILL — ENVARSUS XR 1 MG TABLET,EXTENDED RELEASE: ORAL | 30 days supply | Qty: 30 | Fill #5

## 2021-04-26 MED FILL — ALLOPURINOL 100 MG TABLET: ORAL | 30 days supply | Qty: 30 | Fill #10

## 2021-04-26 MED FILL — OMEPRAZOLE 20 MG CAPSULE,DELAYED RELEASE: ORAL | 30 days supply | Qty: 30 | Fill #3

## 2021-04-26 MED FILL — METOPROLOL SUCCINATE ER 25 MG TABLET,EXTENDED RELEASE 24 HR: ORAL | 30 days supply | Qty: 30 | Fill #1

## 2021-04-26 MED FILL — ENVARSUS XR 0.75 MG TABLET,EXTENDED RELEASE: ORAL | 30 days supply | Qty: 60 | Fill #5

## 2021-05-07 ENCOUNTER — Ambulatory Visit: Admit: 2021-05-07 | Discharge: 2021-05-08 | Payer: MEDICARE

## 2021-05-07 LAB — CBC W/ AUTO DIFF
BASOPHILS ABSOLUTE COUNT: 0 10*9/L (ref 0.0–0.1)
BASOPHILS RELATIVE PERCENT: 0.7 %
EOSINOPHILS ABSOLUTE COUNT: 0.4 10*9/L (ref 0.0–0.5)
EOSINOPHILS RELATIVE PERCENT: 7.8 %
HEMATOCRIT: 45.4 % (ref 39.0–48.0)
HEMOGLOBIN: 14.9 g/dL (ref 12.9–16.5)
LYMPHOCYTES ABSOLUTE COUNT: 0.6 10*9/L — ABNORMAL LOW (ref 1.1–3.6)
LYMPHOCYTES RELATIVE PERCENT: 12.7 %
MEAN CORPUSCULAR HEMOGLOBIN CONC: 32.7 g/dL (ref 32.0–36.0)
MEAN CORPUSCULAR HEMOGLOBIN: 30.6 pg (ref 25.9–32.4)
MEAN CORPUSCULAR VOLUME: 93.5 fL (ref 77.6–95.7)
MEAN PLATELET VOLUME: 8.8 fL (ref 6.8–10.7)
MONOCYTES ABSOLUTE COUNT: 0.4 10*9/L (ref 0.3–0.8)
MONOCYTES RELATIVE PERCENT: 8.1 %
NEUTROPHILS ABSOLUTE COUNT: 3.4 10*9/L (ref 1.8–7.8)
NEUTROPHILS RELATIVE PERCENT: 70.7 %
NUCLEATED RED BLOOD CELLS: 0 /100{WBCs} (ref <=4)
PLATELET COUNT: 175 10*9/L (ref 150–450)
RED BLOOD CELL COUNT: 4.86 10*12/L (ref 4.26–5.60)
RED CELL DISTRIBUTION WIDTH: 14.7 % (ref 12.2–15.2)
WBC ADJUSTED: 4.8 10*9/L (ref 3.6–11.2)

## 2021-05-07 LAB — PHOSPHORUS: PHOSPHORUS: 3.1 mg/dL (ref 2.4–5.1)

## 2021-05-07 LAB — MAGNESIUM: MAGNESIUM: 1.7 mg/dL (ref 1.6–2.6)

## 2021-05-07 LAB — BASIC METABOLIC PANEL
ANION GAP: 9 mmol/L (ref 5–14)
BLOOD UREA NITROGEN: 19 mg/dL (ref 9–23)
BUN / CREAT RATIO: 28
CALCIUM: 9.4 mg/dL (ref 8.7–10.4)
CHLORIDE: 103 mmol/L (ref 98–107)
CO2: 26.9 mmol/L (ref 20.0–31.0)
CREATININE: 0.67 mg/dL
EGFR CKD-EPI (2021) MALE: >90 mL/min/{1.73_m2} (ref >=60)
GLUCOSE RANDOM: 133 mg/dL (ref 70–179)
POTASSIUM: 4.7 mmol/L (ref 3.5–5.1)
SODIUM: 139 mmol/L (ref 135–145)

## 2021-05-08 LAB — TACROLIMUS LEVEL, TROUGH: TACROLIMUS, TROUGH: 8.1 ng/mL (ref 5.0–15.0)

## 2021-05-17 NOTE — Unmapped (Signed)
[facility] Refill Coordination Note    Specialty Medication(s) to be Shipped:   Transplant: Envarsus 1mg , Envarsus 0.75mg  and Myfortic 180mg     Other medication(s) to be shipped:      Allopurinol  Mag 133mg   Metoprolol  Omeprazole       Louis Dennis, DOB: [birth year]  Phone: [phone] (home)       All above HIPAA information was verified with patient.     Was a Nurse, learning disability used for this call? No    Completed refill call assessment today to schedule patient's medication shipment from the [facility] [phone]).  All relevant notes have been reviewed.     Specialty medication(s) and dose(s) confirmed: Regimen is correct and unchanged.   Changes to medications: Louis Dennis reports no changes at this time.  Changes to insurance: No  New side effects reported not previously addressed with a pharmacist or physician: None reported  Questions for the pharmacist: No    Confirmed patient received a Conservation officer, historic buildings and a Surveyor, mining with first shipment. The patient will receive a drug information handout for each medication shipped and additional FDA Medication Guides as required.       DISEASE/MEDICATION-SPECIFIC INFORMATION        N/A    SPECIALTY MEDICATION ADHERENCE     Medication Adherence    Patient reported X missed doses in the last month: 0  Specialty Medication: tacrolimus (ENVARSUS XR) 1 mg Tb24 extended release tablet  Patient is on additional specialty medications: Yes  Additional Specialty Medications: tacrolimus (ENVARSUS XR) 0.75 mg Tb24 extended release tablet  Patient Reported Additional Medication X Missed Doses in the Last Month: 0  Patient is on more than two specialty medications: Yes  Specialty Medication: MYFORTIC 180 mg EC tablet  Patient Reported Additional Medication X Missed Doses in the Last Month: 0              Were doses missed due to medication being on hold? No    Envarsus 1mg   10 days worth of medication on hand.  Envarsus 0.75mg   10 days worth of medication on hand.  Myfortic 180mg   10 days worth of medication on hand.      REFERRAL TO PHARMACIST     Referral to the pharmacist: Not needed      [facility]     Shipping address confirmed in Epic.     Delivery Scheduled: Yes, Expected medication delivery date: 05/25/21.     Medication will be delivered via UPS to the prescription address in Epic [facility].    Louis Dennis   [facility] Specialty Technician

## 2021-05-24 MED FILL — ENVARSUS XR 1 MG TABLET,EXTENDED RELEASE: ORAL | 30 days supply | Qty: 30 | Fill #6

## 2021-05-24 MED FILL — MG-PLUS-PROTEIN 133 MG TABLET: ORAL | 30 days supply | Qty: 60 | Fill #1

## 2021-05-24 MED FILL — ALLOPURINOL 100 MG TABLET: ORAL | 30 days supply | Qty: 30 | Fill #11

## 2021-05-24 MED FILL — MYFORTIC 180 MG TABLET,DELAYED RELEASE: ORAL | 30 days supply | Qty: 180 | Fill #4

## 2021-05-24 MED FILL — OMEPRAZOLE 20 MG CAPSULE,DELAYED RELEASE: ORAL | 30 days supply | Qty: 30 | Fill #4

## 2021-05-24 MED FILL — METOPROLOL SUCCINATE ER 25 MG TABLET,EXTENDED RELEASE 24 HR: ORAL | 30 days supply | Qty: 30 | Fill #2

## 2021-05-24 MED FILL — ENVARSUS XR 0.75 MG TABLET,EXTENDED RELEASE: ORAL | 30 days supply | Qty: 60 | Fill #6

## 2021-06-01 DIAGNOSIS — R8279 Other abnormal findings on microbiological examination of urine: Principal | ICD-10-CM

## 2021-06-01 DIAGNOSIS — Z940 Kidney transplant status: Principal | ICD-10-CM

## 2021-06-01 DIAGNOSIS — D849 Immunodeficiency, unspecified: Principal | ICD-10-CM

## 2021-06-01 DIAGNOSIS — R7989 Other specified abnormal findings of blood chemistry: Principal | ICD-10-CM

## 2021-06-02 ENCOUNTER — Ambulatory Visit: Admit: 2021-06-02 | Discharge: 2021-06-03 | Payer: MEDICARE

## 2021-06-02 LAB — CBC W/ AUTO DIFF
BASOPHILS ABSOLUTE COUNT: 0 10*9/L (ref 0.0–0.1)
BASOPHILS RELATIVE PERCENT: 1.1 %
EOSINOPHILS ABSOLUTE COUNT: 0.4 10*9/L (ref 0.0–0.5)
EOSINOPHILS RELATIVE PERCENT: 9.2 %
HEMATOCRIT: 43.8 % (ref 39.0–48.0)
HEMOGLOBIN: 14.4 g/dL (ref 12.9–16.5)
LYMPHOCYTES ABSOLUTE COUNT: 0.6 10*9/L — ABNORMAL LOW (ref 1.1–3.6)
LYMPHOCYTES RELATIVE PERCENT: 13.5 %
MEAN CORPUSCULAR HEMOGLOBIN CONC: 32.8 g/dL (ref 32.0–36.0)
MEAN CORPUSCULAR HEMOGLOBIN: 30.6 pg (ref 25.9–32.4)
MEAN CORPUSCULAR VOLUME: 93.3 fL (ref 77.6–95.7)
MEAN PLATELET VOLUME: 8.9 fL (ref 6.8–10.7)
MONOCYTES ABSOLUTE COUNT: 0.4 10*9/L (ref 0.3–0.8)
MONOCYTES RELATIVE PERCENT: 8.6 %
NEUTROPHILS ABSOLUTE COUNT: 3 10*9/L (ref 1.8–7.8)
NEUTROPHILS RELATIVE PERCENT: 67.6 %
NUCLEATED RED BLOOD CELLS: 0 /100{WBCs} (ref <=4)
PLATELET COUNT: 166 10*9/L (ref 150–450)
RED BLOOD CELL COUNT: 4.7 10*12/L (ref 4.26–5.60)
RED CELL DISTRIBUTION WIDTH: 14.2 % (ref 12.2–15.2)
WBC ADJUSTED: 4.5 10*9/L (ref 3.6–11.2)

## 2021-06-02 LAB — COMPREHENSIVE METABOLIC PANEL
ALBUMIN: 4.4 g/dL (ref 3.4–5.0)
ALKALINE PHOSPHATASE: 80 U/L (ref 46–116)
ALT (SGPT): 16 U/L (ref 10–49)
ANION GAP: 10 mmol/L (ref 5–14)
AST (SGOT): 15 U/L (ref <=34)
BILIRUBIN TOTAL: 1.1 mg/dL (ref 0.3–1.2)
BLOOD UREA NITROGEN: 15 mg/dL (ref 9–23)
BUN / CREAT RATIO: 23
CALCIUM: 9.3 mg/dL (ref 8.7–10.4)
CHLORIDE: 104 mmol/L (ref 98–107)
CO2: 24.8 mmol/L (ref 20.0–31.0)
CREATININE: 0.66 mg/dL
EGFR CKD-EPI (2021) MALE: >90 mL/min/{1.73_m2} (ref >=60)
GLUCOSE RANDOM: 126 mg/dL (ref 70–179)
POTASSIUM: 4.1 mmol/L (ref 3.4–4.8)
PROTEIN TOTAL: 7 g/dL (ref 5.7–8.2)
SODIUM: 139 mmol/L (ref 135–145)

## 2021-06-02 LAB — LIPID PANEL
CHOLESTEROL/HDL RATIO SCREEN: 3.7 (ref 1.0–4.5)
CHOLESTEROL: 139 mg/dL (ref <=200)
HDL CHOLESTEROL: 38 mg/dL — ABNORMAL LOW (ref 40–60)
LDL CHOLESTEROL CALCULATED: 84 mg/dL (ref 40–99)
NON-HDL CHOLESTEROL: 101 mg/dL (ref 70–130)
TRIGLYCERIDES: 86 mg/dL (ref 0–150)
VLDL CHOLESTEROL CAL: 17.2 mg/dL (ref 12–42)

## 2021-06-02 LAB — MAGNESIUM: MAGNESIUM: 1.9 mg/dL (ref 1.6–2.6)

## 2021-06-02 LAB — TACROLIMUS LEVEL, TROUGH: TACROLIMUS, TROUGH: 8.4 ng/mL (ref 5.0–15.0)

## 2021-06-02 LAB — URINALYSIS
BACTERIA: NONE SEEN /HPF
BILIRUBIN UA: NEGATIVE
BLOOD UA: NEGATIVE
GLUCOSE UA: NEGATIVE
KETONES UA: NEGATIVE
LEUKOCYTE ESTERASE UA: NEGATIVE
NITRITE UA: NEGATIVE
PH UA: 6.5 (ref 5.0–9.0)
PROTEIN UA: NEGATIVE
RBC UA: 3 /HPF — ABNORMAL HIGH (ref <3)
SPECIFIC GRAVITY UA: 1.02 (ref 1.005–1.040)
SQUAMOUS EPITHELIAL: 1 /HPF (ref 0–5)
UROBILINOGEN UA: 0.2
WBC UA: 2 /HPF — ABNORMAL HIGH (ref <2)

## 2021-06-02 LAB — HEMOGLOBIN A1C
ESTIMATED AVERAGE GLUCOSE: 117 mg/dL
HEMOGLOBIN A1C: 5.7 % — ABNORMAL HIGH (ref 4.8–5.6)

## 2021-06-02 LAB — PROTEIN / CREATININE RATIO, URINE
CREATININE, URINE: 65 mg/dL
PROTEIN URINE: 10 mg/dL
PROTEIN/CREAT RATIO, URINE: 0.154

## 2021-06-02 LAB — PARATHYROID HORMONE (PTH): PARATHYROID HORMONE INTACT: 48.7 pg/mL (ref 18.4–80.1)

## 2021-06-02 LAB — PHOSPHORUS: PHOSPHORUS: 3.2 mg/dL (ref 2.4–5.1)

## 2021-06-02 LAB — BILIRUBIN, DIRECT: BILIRUBIN DIRECT: 0.4 mg/dL — ABNORMAL HIGH (ref 0.00–0.30)

## 2021-06-03 DIAGNOSIS — D849 Immunodeficiency, unspecified: Principal | ICD-10-CM

## 2021-06-03 DIAGNOSIS — R82998 Other abnormal findings in urine: Principal | ICD-10-CM

## 2021-06-03 DIAGNOSIS — Z940 Kidney transplant status: Principal | ICD-10-CM

## 2021-06-03 LAB — CMV DNA, QUANTITATIVE, PCR: CMV VIRAL LD: NOT DETECTED

## 2021-06-04 ENCOUNTER — Ambulatory Visit: Admit: 2021-06-04 | Discharge: 2021-06-04 | Payer: MEDICARE

## 2021-06-04 ENCOUNTER — Ambulatory Visit: Admit: 2021-06-04 | Discharge: 2021-06-04 | Payer: MEDICARE | Attending: Nephrology | Primary: Nephrology

## 2021-06-04 DIAGNOSIS — Z940 Kidney transplant status: Principal | ICD-10-CM

## 2021-06-04 LAB — VITAMIN D 25 HYDROXY: VITAMIN D, TOTAL (25OH): 19.1 ng/mL — ABNORMAL LOW (ref 20.0–80.0)

## 2021-06-04 MED ADMIN — tixagevimab-cilgavimab 300 mg/3 mL- 300 mg/3 mL injection 6 mL: 6 mL | INTRAMUSCULAR | @ 14:00:00 | Stop: 2021-06-04

## 2021-06-04 NOTE — Unmapped (Signed)
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   Met with pt during clinic visit today. Pt doing well. Pt would like to make sure its ok for him to get Prevnar 20 at PCP office, he was informed this is ok for him to do.   Pt reports he is getting his medications from pharmacy with no problems at this time.   Per Dr. Nestor Lewandowsky, ok to wait until next lab draw to check BK level that was ordered  Increased Envarsus  Pt to get Evusheld in clinic today     Assessment  BP: 109/60 today in clinic, pt reports BP is usually around this number  Lightheaded: denies  BG: 110-120  Headache: denies  Hand tremors: denies  Numbness/tingling: denies  Fevers: denies  Chills/sweats: denies  Shortness of breath: denies  Chest pain or pressure: denies  Palpitations: denies  Abdominal pain: denies  Heart burn: denies  Nausea/vomiting: denies  Diarrhea/constipation: denies  UTI symptoms: denies  Swelling: denies    Good appetite; reports adequate hydration.     Intake: over 2 L     Any new medications? denies  Immunosuppressant last taken: 0730    Immunization status: Covid x 4    Functional Score: 100   Normal no complaints; no evidence of  disease.     I spent a total of 5 minutes with Sony reviewing medications and symptoms.

## 2021-06-04 NOTE — Unmapped (Signed)
Pt here for Evusheld injections. Education and handout provided to pt and questions answered. Signs and symptoms gone over with patient and they verbalized understanding. First injection given to right side gluteal muscle and second given to left side. Pt tolerated without difficulty. Pt in lobby for one hour observation.     BP:109/60  P:77  O2:99

## 2021-06-04 NOTE — Unmapped (Signed)
AOBP: LEFT arm - cuff MEDIUM  Average: 109/60 Pulse: 77  1st reading: 104/60 Pulse: 77  2nd reading: 112/61 Pulse: 76  3rd reading: 111/60 Pulse: 77

## 2021-06-04 NOTE — Unmapped (Signed)
Transplant Nephrology Clinic Visit    History of Present Illness  [name] is a 59 y.o. 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  -Adequate perfusion of the transplant kidney. Resistive indices within the mid main renal artery and at the anastomosis are slightly elevated. Attention on follow-up.  ??  Current Immunosuppression Regimen:   - Myfortic 540mg  BID  - Envarsus 2.5 mg daily    Subjective/Interval:   Since patient's last visit in the transplant clinic - patient has been doing well in terms of transplant, taking transplant medications regularly, no episodes of rejection and no side effects of medications.    Overall doing well.  Endorses occasional left sided chest pain, but feels it is musculoskeletal and not related to his heart. No associated shortness of breath. Otherwise denies nausea, vomiting, fevers, chills, rashes, ulcers, decreased appetite, or difficulty voiding.    Last took Envarsus 7:30 am.     Review of Systems  Otherwise as per HPI, all other systems reviewed and are negative.    Medications  Current Outpatient Medications   Medication Sig Dispense Refill   ??? allopurinoL (ZYLOPRIM) 100 MG tablet Take 1 tablet (100 mg total) by mouth daily. (Patient taking differently: Take 100 mg by mouth every other day.) 30 tablet 11   ??? biotin 5 mg cap Take 1 capsule (5,000 mcg total) by mouth daily. (Patient taking differently: Take 5 mg by mouth every other day.)  0   ??? DENTA 5000 PLUS 1.1 % Crea daily.      ??? magnesium oxide-Mg AA chelate (MAGNESIUM, AMINO ACID CHELATE,) 133 mg Tab Take 1 tablet by mouth Two (2) times a day. 60 tablet 11   ??? metFORMIN (GLUCOPHAGE-XR) 500 MG 24 hr tablet TAKE 1 TABLET (500 MG TOTAL) BY MOUTH DAILY WITH EVENING MEAL. 90 tablet 3   ??? metoprolol succinate (TOPROL XL) 25 MG 24 hr tablet Take 1 tablet (25 mg total) by mouth nightly. 30 tablet 11   ??? MYFORTIC 180 mg EC tablet Take 3 tablets (540 mg total) by mouth Two (2) times a day. 180 tablet 11   ??? omeprazole (PRILOSEC) 20 MG capsule Take 1 capsule (20 mg total) by mouth daily. 30 capsule 11   ??? tacrolimus (ENVARSUS XR) 0.75 mg Tb24 extended release tablet Take two (0.75mg ) tablets with one 1mg  tablet every morning to equal 2.5mg  daily dose 60 tablet 11   ??? tacrolimus (ENVARSUS XR) 1 mg Tb24 extended release tablet Take one (1mg ) tablet with two (0.75mg ) tablets by mouth every morning to equal 2.5mg  daily dose. 30 tablet 11   ??? triamcinolone (KENALOG) 0.1 % cream Apply topically Two (  2) times a day. (Patient not taking: No sig reported) 80 g 1     No current facility-administered medications for this visit.     Physical Exam  BP 109/60 (BP Site: L Arm, BP Position: Sitting, BP Cuff Size: Medium) Comment: AOBP - Pulse 77  - Temp 36.3 ??C (97.3 ??F) (Temporal)  - Ht 160 cm (5' 3)  - Wt 64.6 kg (142 lb 6.4 oz)  - BMI 25.23 kg/m??   General: Well appearing male, no acute distress  HEENT: wearing mask  Neck: wearing mask  CV: normal rate, normal rhythm, no murmur, no rubs appreciated  Lungs: clear to auscultation bilaterally, normal work of breathing.  Abdomen: soft, non tender.  Extremities: no edema  Musculoskeletal: normal range of motion left shoulder, improved ROM right shoulder.  Pulses: intact distally throughout  Neurologic: awake, alert, and oriented x3    Laboratory Data and Imaging reviewed in EPIC    Assessment: [name] is a 60 y.o. 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       Recommendations/Plan:   Allograft Function: renal function stable at 0.66 with a baseline of approximately 0.7-0.9 since transplant. Will continue to monitor.  UPC 0.154.    Immunosuppression Management [High Risk Medical Decision Making For Drug Therapy Requiring Intensive Monitoring For Toxicity]:  Reduce Envarsus from 2.5 mg to 2 mg daily. Targeting tacrolimus trough level of approximately 6-8.  Continue Myfortic 540 mg BID. Tacrolimus trough 8.4 (06/02/21).    Blood Pressure Management: BP 109/60 at this visit. Patient currently on Toprol-XL 25 mg at night.    Pre-diabetes: Currently on metformin 500 mg daily. Home BG overall controlled. Last A1c was 5.7 on 06/02/21. If BG remains controlled, consider discontinuing metformin in the future; continue for now.    Hypercalcemia: Last Ca WNL  - s/p parathyroid surgery - 11/11/2019    Gout: no flare or activity since ~2016    Lower Back Pain with midline radiculopathy: Known underlying degenerative changes. Resolved per patient.    Left Knee & Right Shoulder Pain: (06/12/20)  - Patient following with PT every 3 weeks for shoulder pain. Notes improved pain and ROM.    Health Maintenance:   - Colonoscopy due 2029  - Blood work once a month    Immunizations:  - Flu shot: 08/31/20  - Prevnar-20: received it today.  - COVID vaccine (Moderna): 04/12/20, 08/15/20, 09/06/20, 03/17/21. Will hold on repeat booster and administer Evusheld in clinic today (06/04/21).    Follow-Up: Return to clinic in 3 months      Scribe's Attestation: Leeroy Bock, MD obtained and performed the history, physical exam and medical decision making elements that were entered into the chart.  Signed by Gaye Alken, Scribe, on June 04, 2021 at 9:49 AM.    Documentation assistance provided by the Scribe. I was present during the time the encounter was recorded. The information recorded by the Scribe was done at my direction and has been reviewed and validated by me.

## 2021-06-06 LAB — EBV QUANTITATIVE PCR, BLOOD: EBV VIRAL LOAD RESULT: NOT DETECTED

## 2021-06-10 LAB — VITAMIN D 1,25 DIHYDROXY: VITAMIN D 1,25-DIHYDROXY: 69 pg/mL — ABNORMAL HIGH

## 2021-06-11 LAB — HLA DS POST TRANSPLANT
ANTI-DONOR DRW #1 MFI: 312 MFI
ANTI-DONOR DRW #2 MFI: 174 MFI
ANTI-DONOR HLA-A #1 MFI: 0 MFI
ANTI-DONOR HLA-A #2 MFI: 0 MFI
ANTI-DONOR HLA-B #1 MFI: 0 MFI
ANTI-DONOR HLA-B #2 MFI: 0 MFI
ANTI-DONOR HLA-C #1 MFI: 0 MFI
ANTI-DONOR HLA-C #2 MFI: 0 MFI
ANTI-DONOR HLA-DQB #1 MFI: 239 MFI
ANTI-DONOR HLA-DQB #2 MFI: 419 MFI
ANTI-DONOR HLA-DR #1 MFI: 5 MFI
ANTI-DONOR HLA-DR #2 MFI: 264 MFI

## 2021-06-11 LAB — FSAB CLASS 2 ANTIBODY SPECIFICITY: HLA CL2 AB RESULT: NEGATIVE

## 2021-06-11 LAB — FSAB CLASS 1 ANTIBODY SPECIFICITY: HLA CLASS 1 ANTIBODY RESULT: NEGATIVE

## 2021-06-18 DIAGNOSIS — Z940 Kidney transplant status: Principal | ICD-10-CM

## 2021-06-18 MED ORDER — ENVARSUS XR 1 MG TABLET,EXTENDED RELEASE
ORAL_TABLET | Freq: Every day | ORAL | 11 refills | 30 days
Start: 2021-06-18 — End: ?

## 2021-06-18 MED ORDER — ALLOPURINOL 100 MG TABLET
ORAL_TABLET | Freq: Every day | ORAL | 11 refills | 30 days
Start: 2021-06-18 — End: 2022-06-18

## 2021-06-18 NOTE — Unmapped (Signed)
[facility] Refill Coordination Note    Specialty Medication(s) to be Shipped:   Transplant: Envarsus 1mg  and Myfortic 180mg     Other medication(s) to be shipped: magnesium, omeprazole, metoprolol and allopurinol     Louis Dennis, DOB: [birth year]  Phone: [phone] (home)       All above HIPAA information was verified with patient.     Was a Nurse, learning disability used for this call? No    Completed refill call assessment today to schedule patient's medication shipment from the [facility] [phone]).  All relevant notes have been reviewed.     Specialty medication(s) and dose(s) confirmed: Patient reports changes to the regimen as follows: Envarsus is now 2mg  daily **sent rf request**   Changes to medications: Louis Dennis reports no changes at this time.  Changes to insurance: No  New side effects reported not previously addressed with a pharmacist or physician: None reported  Questions for the pharmacist: No    Confirmed patient received a Conservation officer, historic buildings and a Surveyor, mining with first shipment. The patient will receive a drug information handout for each medication shipped and additional FDA Medication Guides as required.       DISEASE/MEDICATION-SPECIFIC INFORMATION        N/A    SPECIALTY MEDICATION ADHERENCE     Medication Adherence    Patient reported X missed doses in the last month: 1  Specialty Medication: Envarsus 1mg   Patient is on additional specialty medications: Yes  Additional Specialty Medications: Myfortic 180mg   Patient Reported Additional Medication X Missed Doses in the Last Month: 1  Patient is on more than two specialty medications: No        Were doses missed due to medication being on hold? No    Envarsus 1 mg: 8 days of medicine on hand   Myfortic 180 mg: 8 days of medicine on hand     REFERRAL TO PHARMACIST     Referral to the pharmacist: Not needed      [facility]     Shipping address confirmed in Epic.     Delivery Scheduled: Yes, Expected medication delivery date: 06/23/2021.     Medication will be delivered via Next Day Courier to the prescription address in Epic WAM.    Louis Dennis   [facility] Specialty Technician

## 2021-06-21 MED ORDER — TACROLIMUS XR 1 MG TABLET,EXTENDED RELEASE 24 HR
ORAL_TABLET | Freq: Every day | ORAL | 11 refills | 30 days | Status: CP
Start: 2021-06-21 — End: ?
  Filled 2021-06-22: qty 30, 30d supply, fill #0

## 2021-06-21 NOTE — Unmapped (Signed)
Pt request for RX Refill    tacrolimus (ENVARSUS XR) 1 mg Tb24 extended release tablet

## 2021-06-22 MED ORDER — ALLOPURINOL 100 MG TABLET
ORAL_TABLET | Freq: Every day | ORAL | 11 refills | 30.00000 days | Status: CP
Start: 2021-06-22 — End: 2022-06-22
  Filled 2021-06-22: qty 30, 30d supply, fill #0

## 2021-06-22 MED FILL — METOPROLOL SUCCINATE ER 25 MG TABLET,EXTENDED RELEASE 24 HR: ORAL | 30 days supply | Qty: 30 | Fill #3

## 2021-06-22 MED FILL — MG-PLUS-PROTEIN 133 MG TABLET: ORAL | 30 days supply | Qty: 60 | Fill #2

## 2021-06-22 MED FILL — OMEPRAZOLE 20 MG CAPSULE,DELAYED RELEASE: ORAL | 30 days supply | Qty: 30 | Fill #5

## 2021-06-22 MED FILL — MYFORTIC 180 MG TABLET,DELAYED RELEASE: ORAL | 30 days supply | Qty: 180 | Fill #5

## 2021-07-09 ENCOUNTER — Ambulatory Visit: Admit: 2021-07-09 | Discharge: 2021-07-10 | Payer: MEDICARE

## 2021-07-09 LAB — CBC W/ AUTO DIFF
BASOPHILS ABSOLUTE COUNT: 0 10*9/L (ref 0.0–0.1)
BASOPHILS RELATIVE PERCENT: 0.9 %
EOSINOPHILS ABSOLUTE COUNT: 0.5 10*9/L (ref 0.0–0.5)
EOSINOPHILS RELATIVE PERCENT: 9.1 %
HEMATOCRIT: 43.7 % (ref 39.0–48.0)
HEMOGLOBIN: 14.3 g/dL (ref 12.9–16.5)
LYMPHOCYTES ABSOLUTE COUNT: 0.6 10*9/L — ABNORMAL LOW (ref 1.1–3.6)
LYMPHOCYTES RELATIVE PERCENT: 11.7 %
MEAN CORPUSCULAR HEMOGLOBIN CONC: 32.6 g/dL (ref 32.0–36.0)
MEAN CORPUSCULAR HEMOGLOBIN: 30.5 pg (ref 25.9–32.4)
MEAN CORPUSCULAR VOLUME: 93.5 fL (ref 77.6–95.7)
MEAN PLATELET VOLUME: 9.2 fL (ref 6.8–10.7)
MONOCYTES ABSOLUTE COUNT: 0.4 10*9/L (ref 0.3–0.8)
MONOCYTES RELATIVE PERCENT: 8.1 %
NEUTROPHILS ABSOLUTE COUNT: 3.7 10*9/L (ref 1.8–7.8)
NEUTROPHILS RELATIVE PERCENT: 70.2 %
NUCLEATED RED BLOOD CELLS: 0 /100{WBCs} (ref <=4)
PLATELET COUNT: 187 10*9/L (ref 150–450)
RED BLOOD CELL COUNT: 4.68 10*12/L (ref 4.26–5.60)
RED CELL DISTRIBUTION WIDTH: 14.4 % (ref 12.2–15.2)
WBC ADJUSTED: 5.2 10*9/L (ref 3.6–11.2)

## 2021-07-09 LAB — BASIC METABOLIC PANEL
ANION GAP: 6 mmol/L (ref 5–14)
BLOOD UREA NITROGEN: 17 mg/dL (ref 9–23)
BUN / CREAT RATIO: 25
CALCIUM: 9.6 mg/dL (ref 8.7–10.4)
CHLORIDE: 107 mmol/L (ref 98–107)
CO2: 24.8 mmol/L (ref 20.0–31.0)
CREATININE: 0.67 mg/dL
EGFR CKD-EPI (2021) MALE: >90 mL/min/{1.73_m2} (ref >=60)
GLUCOSE RANDOM: 124 mg/dL (ref 70–179)
POTASSIUM: 4.3 mmol/L (ref 3.4–4.8)
SODIUM: 138 mmol/L (ref 135–145)

## 2021-07-09 LAB — TACROLIMUS LEVEL, TROUGH: TACROLIMUS, TROUGH: 6.3 ng/mL (ref 5.0–15.0)

## 2021-07-09 LAB — MAGNESIUM: MAGNESIUM: 2 mg/dL (ref 1.6–2.6)

## 2021-07-09 LAB — PHOSPHORUS: PHOSPHORUS: 3 mg/dL (ref 2.4–5.1)

## 2021-07-15 DIAGNOSIS — Z940 Kidney transplant status: Principal | ICD-10-CM

## 2021-07-15 MED ORDER — ENVARSUS XR 1 MG TABLET,EXTENDED RELEASE
ORAL_TABLET | Freq: Every day | ORAL | 11 refills | 30.00000 days | Status: CN
Start: 2021-07-15 — End: ?

## 2021-07-15 NOTE — Unmapped (Signed)
Pt request for RX Refill    tacrolimus (ENVARSUS XR) 1 mg Tb24 extended release tablet

## 2021-07-15 NOTE — Unmapped (Signed)
[facility] Refill Coordination Note    Specialty Medication(s) to be Shipped:   Transplant: Envarsus 1mg  and Myfortic 180mg     Other medication(s) to be shipped:     Allopurinol  Mag 133mg   Metoprolol  Omeprazole    PT reports Envarsus dose increase to 2mg  Daily     Louis Dennis, DOB: 17-Aug-1961  Phone: [phone] (home)       All above HIPAA information was verified with patient.     Was a Nurse, learning disability used for this call? No    Completed refill call assessment today to schedule patient's medication shipment from the [facility] [phone]).  All relevant notes have been reviewed.     Specialty medication(s) and dose(s) confirmed: Regimen is correct and unchanged.   Changes to medications: Harlan reports no changes at this time.  Changes to insurance: No  New side effects reported not previously addressed with a pharmacist or physician: None reported  Questions for the pharmacist: No    Confirmed patient received a Conservation officer, historic buildings and a Surveyor, mining with first shipment. The patient will receive a drug information handout for each medication shipped and additional FDA Medication Guides as required.       DISEASE/MEDICATION-SPECIFIC INFORMATION        N/A    SPECIALTY MEDICATION ADHERENCE   Envarsus 1mg  7 days worth of medication on hand.  Myfortic 180mg   10 days worth of medication on hand.           Were doses missed due to medication being on hold? No        REFERRAL TO PHARMACIST     Referral to the pharmacist: Not needed      [facility]     Shipping address confirmed in Epic.     Delivery Scheduled: Yes, Expected medication delivery date: 07/20/21.     Medication will be delivered via Next Day Courier to the prescription address in Epic WAM.    [place] A Magdelyn Roebuck   [facility] Specialty Technician

## 2021-07-16 DIAGNOSIS — Z940 Kidney transplant status: Principal | ICD-10-CM

## 2021-07-16 MED ORDER — TACROLIMUS XR 1 MG TABLET,EXTENDED RELEASE 24 HR
ORAL_TABLET | Freq: Every day | ORAL | 11 refills | 30 days | Status: CP
Start: 2021-07-16 — End: ?

## 2021-07-16 MED ORDER — TACROLIMUS XR 0.75 MG TABLET,EXTENDED RELEASE 24 HR
ORAL_TABLET | Freq: Every day | ORAL | 11 refills | 30 days | Status: CP
Start: 2021-07-16 — End: ?

## 2021-07-16 NOTE — Unmapped (Signed)
Clinical Assessment Needed For: Dose Change  Medication: Envarsus XR 0.75mg  tablet  Last Fill Date/Day Supply: 05/24/2021 / 30 days  Copay $0  Was previous dose already scheduled to fill: Yes    Notes to Pharmacist: Scheduled to fill TODAY 08/12      Clinical Assessment Needed For: Dose Change  Medication: Envarsus XR 1mg  tablet  Last Fill Date/Day Supply: 06/22/2021 / 30 days  Copay $0  Was previous dose already scheduled to fill: Yes    Notes to Pharmacist: Scheduled to fill TODAY 08/12. Pt reports 2mg  every day, but Coordinator indicates 2.5mg  daily

## 2021-07-19 DIAGNOSIS — Z940 Kidney transplant status: Principal | ICD-10-CM

## 2021-07-19 DIAGNOSIS — D849 Immunodeficiency, unspecified: Principal | ICD-10-CM

## 2021-07-19 MED ORDER — TACROLIMUS XR 1 MG TABLET,EXTENDED RELEASE 24 HR
ORAL_TABLET | Freq: Every day | ORAL | 5 refills | 30.00000 days | Status: CP
Start: 2021-07-19 — End: 2022-07-19
  Filled 2021-07-19: qty 60, 30d supply, fill #0

## 2021-07-19 MED FILL — OMEPRAZOLE 20 MG CAPSULE,DELAYED RELEASE: ORAL | 30 days supply | Qty: 30 | Fill #6

## 2021-07-19 MED FILL — ALLOPURINOL 100 MG TABLET: ORAL | 30 days supply | Qty: 30 | Fill #1

## 2021-07-19 MED FILL — METOPROLOL SUCCINATE ER 25 MG TABLET,EXTENDED RELEASE 24 HR: ORAL | 30 days supply | Qty: 30 | Fill #4

## 2021-07-19 MED FILL — MG-PLUS-PROTEIN 133 MG TABLET: ORAL | 30 days supply | Qty: 60 | Fill #3

## 2021-07-19 MED FILL — MYFORTIC 180 MG TABLET,DELAYED RELEASE: ORAL | 30 days supply | Qty: 180 | Fill #6

## 2021-08-03 ENCOUNTER — Ambulatory Visit: Admit: 2021-08-03 | Discharge: 2021-08-04 | Payer: MEDICARE

## 2021-08-03 LAB — CBC W/ AUTO DIFF
BASOPHILS ABSOLUTE COUNT: 0 10*9/L (ref 0.0–0.1)
BASOPHILS RELATIVE PERCENT: 1 %
EOSINOPHILS ABSOLUTE COUNT: 0.4 10*9/L (ref 0.0–0.5)
EOSINOPHILS RELATIVE PERCENT: 10.6 %
HEMATOCRIT: 44 % (ref 39.0–48.0)
HEMOGLOBIN: 14.5 g/dL (ref 12.9–16.5)
LYMPHOCYTES ABSOLUTE COUNT: 0.6 10*9/L — ABNORMAL LOW (ref 1.1–3.6)
LYMPHOCYTES RELATIVE PERCENT: 13.4 %
MEAN CORPUSCULAR HEMOGLOBIN CONC: 32.9 g/dL (ref 32.0–36.0)
MEAN CORPUSCULAR HEMOGLOBIN: 30.8 pg (ref 25.9–32.4)
MEAN CORPUSCULAR VOLUME: 93.4 fL (ref 77.6–95.7)
MEAN PLATELET VOLUME: 8.9 fL (ref 6.8–10.7)
MONOCYTES ABSOLUTE COUNT: 0.4 10*9/L (ref 0.3–0.8)
MONOCYTES RELATIVE PERCENT: 8.9 %
NEUTROPHILS ABSOLUTE COUNT: 2.8 10*9/L (ref 1.8–7.8)
NEUTROPHILS RELATIVE PERCENT: 66.1 %
NUCLEATED RED BLOOD CELLS: 0 /100{WBCs} (ref <=4)
PLATELET COUNT: 175 10*9/L (ref 150–450)
RED BLOOD CELL COUNT: 4.71 10*12/L (ref 4.26–5.60)
RED CELL DISTRIBUTION WIDTH: 14.5 % (ref 12.2–15.2)
WBC ADJUSTED: 4.2 10*9/L (ref 3.6–11.2)

## 2021-08-03 LAB — BASIC METABOLIC PANEL
ANION GAP: 7 mmol/L (ref 5–14)
BLOOD UREA NITROGEN: 13 mg/dL (ref 9–23)
BUN / CREAT RATIO: 19
CALCIUM: 9.7 mg/dL (ref 8.7–10.4)
CHLORIDE: 106 mmol/L (ref 98–107)
CO2: 26.6 mmol/L (ref 20.0–31.0)
CREATININE: 0.7 mg/dL
EGFR CKD-EPI (2021) MALE: >90 mL/min/{1.73_m2} (ref >=60)
GLUCOSE RANDOM: 127 mg/dL (ref 70–179)
POTASSIUM: 4.3 mmol/L (ref 3.4–4.8)
SODIUM: 140 mmol/L (ref 135–145)

## 2021-08-03 LAB — TACROLIMUS LEVEL, TROUGH: TACROLIMUS, TROUGH: 7.2 ng/mL (ref 5.0–15.0)

## 2021-08-03 LAB — PHOSPHORUS: PHOSPHORUS: 3.4 mg/dL (ref 2.4–5.1)

## 2021-08-03 LAB — MAGNESIUM: MAGNESIUM: 1.9 mg/dL (ref 1.6–2.6)

## 2021-08-05 LAB — BK VIRUS QUANTITATIVE PCR, BLOOD: BK BLOOD RESULT: NOT DETECTED

## 2021-08-13 NOTE — Unmapped (Signed)
[facility] Refill Coordination Note    Specialty Medication(s) to be Shipped:   Transplant: Envarsus 1mg  and Myfortic 180mg     Other medication(s) to be shipped:   Allopurinol   Mag 133mg    Metoprolol   Omeprazole       Louis Dennis, DOB: Jan 30, 1961  Phone: [phone] (home)       All above HIPAA information was verified with patient.     Was a Nurse, learning disability used for this call? No    Completed refill call assessment today to schedule patient's medication shipment from the [facility] [phone]).  All relevant notes have been reviewed.     Specialty medication(s) and dose(s) confirmed: Regimen is correct and unchanged.   Changes to medications: Louis Dennis reports no changes at this time.  Changes to insurance: No  New side effects reported not previously addressed with a pharmacist or physician: None reported  Questions for the pharmacist: No    Confirmed patient received a Conservation officer, historic buildings and a Surveyor, mining with first shipment. The patient will receive a drug information handout for each medication shipped and additional FDA Medication Guides as required.       DISEASE/MEDICATION-SPECIFIC INFORMATION        N/A    SPECIALTY MEDICATION ADHERENCE     Medication Adherence    Patient reported X missed doses in the last month: 0  Specialty Medication: tacrolimus (ENVARSUS XR) 1 mg Tb24 extended release tablet  Patient is on additional specialty medications: Yes  Additional Specialty Medications: MYFORTIC 180 mg EC tablet  Patient Reported Additional Medication X Missed Doses in the Last Month: 0              Were doses missed due to medication being on hold? No    Envarsus 1mg  7 days worth of medication on hand.   Myfortic 180mg  8 days worth of medication on hand.      REFERRAL TO PHARMACIST     Referral to the pharmacist: Not needed      [facility]     Shipping address confirmed in Epic.     Delivery Scheduled: Yes, Expected medication delivery date: 08/17/21.     Medication will be delivered via Next Day Courier to the prescription address in Epic WAM.    Louis Dennis   [facility] Specialty Technician

## 2021-08-16 MED FILL — METOPROLOL SUCCINATE ER 25 MG TABLET,EXTENDED RELEASE 24 HR: ORAL | 30 days supply | Qty: 30 | Fill #5

## 2021-08-16 MED FILL — MG-PLUS-PROTEIN 133 MG TABLET: ORAL | 30 days supply | Qty: 60 | Fill #4

## 2021-08-16 MED FILL — MYFORTIC 180 MG TABLET,DELAYED RELEASE: ORAL | 30 days supply | Qty: 180 | Fill #7

## 2021-08-16 MED FILL — ALLOPURINOL 100 MG TABLET: ORAL | 30 days supply | Qty: 30 | Fill #2

## 2021-08-16 MED FILL — OMEPRAZOLE 20 MG CAPSULE,DELAYED RELEASE: ORAL | 30 days supply | Qty: 30 | Fill #7

## 2021-08-16 MED FILL — ENVARSUS XR 1 MG TABLET,EXTENDED RELEASE: ORAL | 30 days supply | Qty: 60 | Fill #1

## 2021-08-24 DIAGNOSIS — Z940 Kidney transplant status: Principal | ICD-10-CM

## 2021-08-24 DIAGNOSIS — R82998 Other abnormal findings in urine: Principal | ICD-10-CM

## 2021-08-24 DIAGNOSIS — D849 Immunodeficiency, unspecified: Principal | ICD-10-CM

## 2021-08-30 ENCOUNTER — Ambulatory Visit: Admit: 2021-08-30 | Discharge: 2021-08-31 | Payer: MEDICARE | Attending: Family | Primary: Family

## 2021-08-30 DIAGNOSIS — R7303 Prediabetes: Principal | ICD-10-CM

## 2021-08-30 NOTE — Unmapped (Signed)
Assessment and Plan:     Sovereign was seen today for renovascular hypertension, prediabtes, immunizations and toe pain.    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 Continue metoprolol 25 mg HS. Reviewed low sodium diet and encouraged regular exercise. Advised to continue to monitor and log at-home BP readings    Pre-diabetes  HGB A1c 5.7 (slightly down from 5.8 three months ago).   DM well controlled. Managed by nephrology.??Continue metformin 500 mg daily.  Encouraged patient to continue carb controlled diet and regular exercise.??  -     POCT glycosylated hemoglobin (Hb A1C); Future  -     POCT glycosylated hemoglobin (Hb A1C)    Need for influenza vaccination  -     INFLUENZA INJ MDCK PF, QUAD (FLUCELVAX)(57MO AND UP EGG FREE)    Need for pneumococcal vaccination  -     PNEUMOCOCCAL CONJUGATE VACCINE 20-VALENT    Discussed podiatry referral with patient. Will try callous cushions x 3 months. If symptoms persist, agreeable to podiatry referral at next follow up visit.     I personally spent 25 minutes face-to-face and non-face-to-face in the care of this patient, which includes all pre, intra, and post visit time on the date of service.    Return in 3 months (on 11/29/2021) for Next scheduled follow up.    HPI:      Louis [place]  is here for   Chief Complaint   Patient presents with   ??? renovascular hypertension   ??? prediabtes   ??? Immunizations     Doctor Jawa asked him to get Prevnar 20.  Agrees to get Flu shot.   ??? Toe Pain     B/L 5th toes painful/sensitive when touching his shoes.  Fine without shoes.     Hypertension: Patient presents for follow-up of hypertension. Blood pressure goal < 140/90.  Hypertension has customarily been at goal complicated by renal disease (post transplant).  Home blood pressure readings: 100-110/60's. Salt intake and diet: salt not added to cooking and salt shaker not on table. Associated signs and symptoms: none. Patient denies: blurred vision, chest pain, dyspnea, headache, neck aches, orthopnea, palpitations, paroxysmal nocturnal dyspnea, peripheral edema, pulsating in the ears and tiredness/fatigue. Medication compliance: taking as prescribed. He is doing regular exercise.     Prediabetes: Patient presents for follow up of prediabetes.  A1C goal is <8.  Current symptoms include: none. Symptoms have been well-controlled. Patient denies foot ulcerations, hyperglycemia, hypoglycemia , increase appetite, nausea, paresthesia of the feet, polydipsia, polyuria, visual disturbances, vomitting and weight loss. Evaluation to date has included: hemoglobin A1C.  Home sugars: did not bring log. Current treatment: Continued metformin  which has been effective.     Patient reports pain and sensitivity of bilateral 5th toes when wearing shoes. He denies pain when he is not wearing shoes (in the home). Wears wide shoes. No injury to feet/toes. Denies numbness or tingling.     ROS:      Comprehensive 10 point ROS negative unless otherwise stated in the HPI.      [facility] Components:     Medication adherence and barriers to the treatment plan have been addressed. Opportunities to optimize healthy behaviors have been discussed. Patient / caregiver voiced understanding.    Past Medical/Surgical History:     Past Medical History:   Diagnosis Date   ??? Abnormal thyroid function test    ??? End stage renal  disease (CMS-HCC)     now on peritoneal dialysis   ??? Gout     reports was in Right hand, foot and left elbow   ??? Hypertension     takes meds intermitttently based on readings   ??? Nephrolithiasis 2014   ??? Visual impairment     glasses     Past Surgical History:   Procedure Laterality Date   ??? OTHER SURGICAL HISTORY Right 08/2017    Pt had catheter moved from left side to right side.   ??? pd catheter  2012   ??? PR COLONOSCOPY W/BIOPSY SINGLE/MULTIPLE N/A 06/15/2018    Procedure: COLONOSCOPY, FLEXIBLE, PROXIMAL TO SPLENIC FLEXURE; WITH BIOPSY, SINGLE OR MULTIPLE; Surgeon: Charm Rings, MD;  [facility];  Service: Gastroenterology   ??? PR COLSC FLX W/RMVL OF TUMOR POLYP LESION SNARE TQ N/A 06/15/2018    Procedure: COLONOSCOPY FLEX; W/REMOV TUMOR/LES BY SNARE;  Surgeon: Charm Rings, MD;  Location: [facility];  Service: Gastroenterology   ??? PR EXPLORATORY OF ABDOMEN N/A 11/22/2018    Procedure: EXPLORATORY LAPAROTOMY, EXPLORATORY CELIOTOMY WITH OR WITHOUT BIOPSY(S);  Surgeon: Lawrence Marseilles Day Thurnell Lose, MD;  Location: MAIN OR [facility];  Service: Trauma   ??? PR EXPLORE PARATHYROID GLANDS N/A 11/11/2019    Procedure: RS 22 PARATHYROIDECTOMY OR EXPLORATION OF PARATHYROID(S);  Surgeon: Johny Chess, MD;  Location: [facility] OR [facility];  Service: Surgical Oncology   ??? PR FREEING BOWEL ADHESION,ENTEROLYSIS N/A 11/22/2018    Procedure: Enterolysis (Separt Proc);  Surgeon: Lawrence Marseilles Day Thurnell Lose, MD;  Location: MAIN OR [facility];  Service: Trauma   ??? PR REMOVE PERITONEAL FOREIGN BODY N/A 11/22/2018    Procedure: Removal Of Peritoneal Of Foreign Body From Peritoneal Cavity;  Surgeon: Lawrence Marseilles Day Thurnell Lose, MD;  Location: MAIN OR [facility];  Service: Trauma   ??? PR TRANSPLANT,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    Procedure: [facility] STD PREP CAD DONR RENAL ALLOGFT PRIOR TO TRNSPLNT, INCL DISSEC/REM PERINEPH FAT, DIAPH/RTPER ATTAC;  Surgeon: Doyce Loose, MD;  Location: MAIN OR [facility];  Service: Transplant   ??? PR TRANSPLANTATION OF KIDNEY N/A 05/24/2019    Procedure: RENAL ALLOTRANSPLANTATION, IMPLANTATION OF GRAFT; WITHOUT RECIPIENT NEPHRECTOMY;  Surgeon: Doyce Loose, MD;  Location: MAIN OR [facility];  Service: Transplant       Family History:     Family History   Problem Relation Age of Onset   ??? Hypertension Mother    ??? Kidney disease Mother    ??? Diabetes Mother    ??? Hypertension Father    ??? Kidney disease Sister         s/p transplant   ??? Hypertension Brother    ??? Glaucoma Neg Hx    ??? Amblyopia Neg Hx    ??? Blindness Neg Hx    ??? Retinal detachment Neg Hx    ??? Strabismus Neg Hx    ??? Macular degeneration Neg Hx    ??? Basal cell carcinoma Neg Hx    ??? Melanoma Neg Hx    ??? Squamous cell carcinoma Neg Hx        Social History:     Social History     Tobacco Use   ??? Smoking status: Former Smoker     Packs/day: 1.00     Years: 30.00     Pack years: 30.00     Types: Cigarettes     Quit date: 07/10/2009     Years since quitting: 12.1   ???  Smokeless tobacco: Never Used   Vaping Use   ??? Vaping Use: Never used   Substance Use Topics   ??? Alcohol use: No     Alcohol/week: 0.0 standard drinks   ??? Drug use: No       Allergies:     Patient has no known allergies.    Current Medications:     Current Outpatient Medications   Medication Sig Dispense Refill   ??? allopurinoL (ZYLOPRIM) 100 MG tablet Take 1 tablet (100 mg total) by mouth daily. 30 tablet 11   ??? biotin 5 mg cap Take 1 capsule (5,000 mcg total) by mouth daily. (Patient taking differently: Take 5 mg by mouth every other day.)  0   ??? magnesium oxide-Mg AA chelate (MAGNESIUM, AMINO ACID CHELATE,) 133 mg Tab Take 1 tablet by mouth Two (2) times a day. 60 tablet 11   ??? metFORMIN (GLUCOPHAGE-XR) 500 MG 24 hr tablet TAKE 1 TABLET (500 MG TOTAL) BY MOUTH DAILY WITH EVENING MEAL. 90 tablet 3   ??? metoprolol succinate (TOPROL XL) 25 MG 24 hr tablet Take 1 tablet (25 mg total) by mouth nightly. 30 tablet 11   ??? MYFORTIC 180 mg EC tablet Take 3 tablets (540 mg total) by mouth Two (2) times a day. 180 tablet 11   ??? omeprazole (PRILOSEC) 20 MG capsule Take 1 capsule (20 mg total) by mouth daily. 30 capsule 11   ??? tacrolimus (ENVARSUS XR) 1 mg Tb24 extended release tablet Take 2 tablets (2 mg total) by mouth in the morning. 60 tablet 5   ??? DENTA 5000 PLUS 1.1 % Crea daily.  (Patient not taking: Reported on 08/30/2021)     ??? triamcinolone (KENALOG) 0.1 % cream Apply topically Two (2) times a day. (Patient not taking: No sig reported) 80 g 1     No current facility-administered medications for this visit. Health Maintenance:     Health Maintenance   Topic Date Due   ??? Meningococcal B Vaccines (1 of 4 - Increased Risk Bexsero 2-dose series) Never done   ??? COVID-19 Vaccine (5 - Booster for Moderna series) 07/17/2021   ??? Lipid Screening  06/02/2026   ??? Colonoscopy  06/15/2028   ??? DTaP/Tdap/Td Vaccines (2 - Td or Tdap) 12/30/2029   ??? Pneumococcal Vaccine 0-64  Completed   ??? Hepatitis C Screen  Completed   ??? Influenza Vaccine  Completed   ??? Zoster Vaccines  Discontinued       Immunizations:     Immunization History   Administered Date(s) Administered   ??? COVID-19 VACCINE,MRNA(MODERNA)(PF)(IM) 04/12/2020, 05/10/2020, 08/15/2020, 03/17/2021   ??? INFLUENZA INJ MDCK PF, QUAD,(FLUCELVAX)(48MO AND UP EGG FREE) 09/04/2017, 08/31/2020, 08/30/2021   ??? INFLUENZA TIV (TRI) PF (IM) 08/17/2011, 08/08/2012, 08/05/2013, 08/28/2014, 08/11/2015, 08/29/2015   ??? Influenza Vaccine Quad (IIV4 PF) 23mo+ injectable 08/16/2015, 09/06/2016, 09/11/2017, 08/06/2019   ??? Influenza Virus Vaccine, unspecified formulation 08/30/2016, 09/18/2017, 09/04/2018, 10/02/2018   ??? PNEUMOCOCCAL POLYSACCHARIDE 23 07/06/2015   ??? PPD Test 09/08/2011, 09/07/2012, 09/05/2013, 09/09/2014, 09/07/2015, 08/10/2016, 09/11/2017, 09/05/2018, 01/03/2019   ??? Pneumococcal Conjugate 13-Valent 07/24/2010, 07/15/2015   ??? Pneumococcal Conjugate 20-valent 08/30/2021   ??? TdaP 12/31/2019     I have reviewed and (if needed) updated the patient's problem list, medications, allergies, past medical and surgical history, social and family history.     Vital Signs:     Wt Readings from Last 3 Encounters:   08/30/21 65.8 kg (145 lb)   06/04/21 64.6 kg (142 lb 6.4 oz)  04/22/21 64.4 kg (142 lb)     Temp Readings from Last 3 Encounters:   08/30/21 36.7 ??C (98.1 ??F) (Oral)   06/04/21 36.3 ??C (97.3 ??F) (Temporal)   04/22/21 36.8 ??C (98.2 ??F) (Oral)     BP Readings from Last 3 Encounters:   08/30/21 102/64   06/04/21 109/60   04/22/21 102/64     Pulse Readings from Last 3 Encounters: 08/30/21 90   06/04/21 77   04/22/21 69     Estimated body mass index is 25.69 kg/m?? as calculated from the following:    Height as of this encounter: 160 cm (5' 3).    Weight as of this encounter: 65.8 kg (145 lb).  Facility age limit for growth percentiles is 20 years.      Objective:      General: Alert and oriented x3. Well-appearing. No acute distress.   HEENT:  Normocephalic.  Atraumatic. Conjunctiva and sclera normal. OP MMM without lesions.   Neck:  Supple. No thyroid enlargement. No adenopathy.   Heart:  Regular rate and rhythm. Normal S1, S2. No murmurs, rubs or gallops.   Lungs:  No respiratory distress.  Lungs clear to auscultation. No wheezes, rhonchi, or rales.   GI/GU:  Soft, +BS, nondistended, non-TTP. No palpable masses or organomegaly.   Extremities:  No edema. Peripheral pulses normal. Small callous lateral 5th toe bilateral.   Skin:  Warm, dry. No rash or lesions present.   Neuro:  Non-focal. No obvious weakness.   Psych:  Affect normal, eye contact good, speech clear and coherent.      Noralyn Pick, FNP

## 2021-08-31 NOTE — Unmapped (Signed)
Transplant Nephrology Clinic Visit    History of Present Illness  [name] is a 60 y.o. 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  -Adequate perfusion of the transplant kidney. Resistive indices within the mid main renal artery and at the anastomosis are slightly elevated. Attention on follow-up.  ??  Current Immunosuppression Regimen:   - Myfortic 540mg  BID  - Envarsus 2 mg daily    Subjective/Interval:   Since patient's last visit in the transplant clinic - patient has been doing well in terms of transplant, taking transplant medications regularly, no episodes of rejection and no side effects of medication.    Today patient presents feeling overall well. Still experiences pain in his L knee when he goes to sleep, but it feels normal during the day. He is trying exercises before he looks into physical therapy. He takes Prilosec every other day for acid reflux, and he no longer experiences symptoms. He wants to travel to [place] next summer. Denies shortness of breath, nausea, vomiting, loss of appetite, weight loss, dysuria, lower extremity swelling    Last took Envarsus 7:30 am.     Review of Systems  Otherwise as per HPI, all other systems reviewed and are negative.    Medications  Current Outpatient Medications   Medication Sig Dispense Refill   ??? magnesium oxide-Mg AA chelate (MAGNESIUM, AMINO ACID CHELATE,) 133 mg Tab Take 1 tablet by mouth Two (2) times a day. 60 tablet 11   ??? metFORMIN (GLUCOPHAGE-XR) 500 MG 24 hr tablet TAKE 1 TABLET (500 MG TOTAL) BY MOUTH DAILY WITH EVENING MEAL. 90 tablet 3   ??? metoprolol succinate (TOPROL XL) 25 MG 24 hr tablet Take 1 tablet (25 mg total) by mouth nightly. 30 tablet 11   ??? MYFORTIC 180 mg EC tablet Take 3 tablets (540 mg total) by mouth Two (2) times a day. 180 tablet 11   ??? tacrolimus (ENVARSUS XR) 1 mg Tb24 extended release tablet Take 2 tablets (2 mg total) by mouth in the morning. 60 tablet 5     No current facility-administered medications for this visit.     Physical Exam  BP 108/63 (BP Site: R Arm, BP Position: Sitting, BP Cuff Size: Large)  - Pulse 77  - Temp 36.3 ??C (97.4 ??F) (Temporal)  - Ht 160 cm (5' 3)  - Wt 66.4 kg (146 lb 6.4 oz)  - BMI 25.93 kg/m??     Nursing note and vitals reviewed.   Constitutional: Oriented to person, place, and time. Appears well-developed and well-nourished. No distress.   HENT: wearing mask  Head: Normocephalic and atraumatic.   Eyes: Right eye exhibits no  discharge. Left eye exhibits no discharge. No scleral icterus.   Neck: Normal range of motion. Neck supple.   Cardiovascular: Normal rate and regular rhythm. Exam reveals no gallop and no friction rub. No murmur heard.   Pulmonary/Chest: Effort normal and breath sounds normal. No respiratory distress.   Abdominal: Soft. Non tender  Musculoskeletal: Normal range of motion. No edema and no tenderness.   Neurological: Alert and oriented to person, place, and time.       Laboratory Data and Imaging reviewed in EPIC    Assessment: [name] is a 60 y.o. 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       Recommendations/Plan:   Allograft Function: renal function stable at 0.66 with a baseline of approximately 0.7-0.9 since transplant. Will continue to monitor.  UPC is undetectable.    Immunosuppression Management [High Risk Medical Decision Making For Drug Therapy Requiring Intensive Monitoring For Toxicity]:  Continue Envarsus 2 mg daily. Targeting tacrolimus trough level of approximately 5-7.  Continue Myfortic 540 mg BID. Tacrolimus trough 5.6 (09/01/21).    Blood Pressure Management: BP 108/63 at this visit. Patient currently on Toprol-XL 25 mg at night.    Pre-diabetes: Currently on metformin 500 mg daily. Home BG overall controlled. Last A1c was 5.7 on 06/02/21. If BG remains controlled, consider discontinuing metformin in the future; continue for now.    Hypercalcemia: Last Ca WNL  - s/p parathyroid surgery - 11/11/2019    Gout: no flare or activity since ~2016. He will stop allopurinol.    Lower Back Pain with midline radiculopathy: Known underlying degenerative changes. Resolved per patient.    Left Knee & Right Shoulder Pain: (06/12/20)  - Patient following with PT every 3 weeks for shoulder pain. Notes improved pain and ROM.    Health Maintenance:   - Colonoscopy due 2029  - Blood work once a month    Immunizations:  - Flu shot: 08/30/21  - Prevnar-20: 08/30/21.  - COVID vaccine (Moderna): 04/12/20, 08/15/20, 09/06/20, 03/17/21. Evusheld in clinic (06/04/21).    Follow-Up: Return to clinic in 3 months.    Scribe's Attestation: Leeroy Bock, MD obtained and performed the history, physical exam and medical decision making elements that were entered into the chart.  Signed by Lauralee Evener, Scribe, on September 03, 2021 at 2:56 PM.    Documentation assistance provided by the Scribe. I was present during the time the encounter was recorded. The information recorded by the Scribe was done at my direction and has been reviewed and validated by me.

## 2021-09-01 ENCOUNTER — Ambulatory Visit: Admit: 2021-09-01 | Discharge: 2021-09-02 | Payer: MEDICARE

## 2021-09-01 LAB — URINALYSIS
BILIRUBIN UA: NEGATIVE
BLOOD UA: NEGATIVE
GLUCOSE UA: NEGATIVE
KETONES UA: 15 — AB
LEUKOCYTE ESTERASE UA: NEGATIVE
NITRITE UA: NEGATIVE
PH UA: 6.5 (ref 5.0–9.0)
PROTEIN UA: NEGATIVE
RBC UA: 0 /HPF (ref 0–3)
SPECIFIC GRAVITY UA: 1.02 (ref 1.005–1.040)
SQUAMOUS EPITHELIAL: 0 /HPF (ref 0–5)
UROBILINOGEN UA: 0.2
WBC UA: 2 /HPF (ref 0–3)

## 2021-09-01 LAB — COMPREHENSIVE METABOLIC PANEL
ALBUMIN: 4.5 g/dL (ref 3.4–5.0)
ALKALINE PHOSPHATASE: 82 U/L (ref 46–116)
ALT (SGPT): 19 U/L (ref 10–49)
ANION GAP: 6 mmol/L (ref 5–14)
AST (SGOT): 18 U/L (ref <=34)
BILIRUBIN TOTAL: 0.8 mg/dL (ref 0.3–1.2)
BLOOD UREA NITROGEN: 20 mg/dL (ref 9–23)
BUN / CREAT RATIO: 30
CALCIUM: 9.7 mg/dL (ref 8.7–10.4)
CHLORIDE: 106 mmol/L (ref 98–107)
CO2: 25.8 mmol/L (ref 20.0–31.0)
CREATININE: 0.66 mg/dL
EGFR CKD-EPI (2021) MALE: >90 mL/min/{1.73_m2} (ref >=60)
GLUCOSE RANDOM: 126 mg/dL — ABNORMAL HIGH (ref 70–99)
POTASSIUM: 4.3 mmol/L (ref 3.4–4.8)
PROTEIN TOTAL: 6.9 g/dL (ref 5.7–8.2)
SODIUM: 138 mmol/L (ref 135–145)

## 2021-09-01 LAB — CBC W/ AUTO DIFF
BASOPHILS ABSOLUTE COUNT: 0 10*9/L (ref 0.0–0.1)
BASOPHILS RELATIVE PERCENT: 0.9 %
EOSINOPHILS ABSOLUTE COUNT: 0.4 10*9/L (ref 0.0–0.5)
EOSINOPHILS RELATIVE PERCENT: 9.5 %
HEMATOCRIT: 43.4 % (ref 39.0–48.0)
HEMOGLOBIN: 14.2 g/dL (ref 12.9–16.5)
LYMPHOCYTES ABSOLUTE COUNT: 0.4 10*9/L — ABNORMAL LOW (ref 1.1–3.6)
LYMPHOCYTES RELATIVE PERCENT: 9.4 %
MEAN CORPUSCULAR HEMOGLOBIN CONC: 32.7 g/dL (ref 32.0–36.0)
MEAN CORPUSCULAR HEMOGLOBIN: 30.5 pg (ref 25.9–32.4)
MEAN CORPUSCULAR VOLUME: 93.2 fL (ref 77.6–95.7)
MEAN PLATELET VOLUME: 8.9 fL (ref 6.8–10.7)
MONOCYTES ABSOLUTE COUNT: 0.4 10*9/L (ref 0.3–0.8)
MONOCYTES RELATIVE PERCENT: 10 %
NEUTROPHILS ABSOLUTE COUNT: 3.1 10*9/L (ref 1.8–7.8)
NEUTROPHILS RELATIVE PERCENT: 70.2 %
NUCLEATED RED BLOOD CELLS: 0 /100{WBCs} (ref <=4)
PLATELET COUNT: 156 10*9/L (ref 150–450)
RED BLOOD CELL COUNT: 4.65 10*12/L (ref 4.26–5.60)
RED CELL DISTRIBUTION WIDTH: 14.7 % (ref 12.2–15.2)
WBC ADJUSTED: 4.4 10*9/L (ref 3.6–11.2)

## 2021-09-01 LAB — LIPID PANEL
CHOLESTEROL/HDL RATIO SCREEN: 4.1 (ref 1.0–4.5)
CHOLESTEROL: 139 mg/dL (ref <=200)
HDL CHOLESTEROL: 34 mg/dL — ABNORMAL LOW (ref 40–60)
LDL CHOLESTEROL CALCULATED: 80 mg/dL (ref 40–99)
NON-HDL CHOLESTEROL: 105 mg/dL (ref 70–130)
TRIGLYCERIDES: 124 mg/dL (ref 0–150)
VLDL CHOLESTEROL CAL: 24.8 mg/dL (ref 12–42)

## 2021-09-01 LAB — PARATHYROID HORMONE (PTH): PARATHYROID HORMONE INTACT: 62 pg/mL (ref 18.4–80.1)

## 2021-09-01 LAB — TACROLIMUS LEVEL, TROUGH: TACROLIMUS, TROUGH: 5.6 ng/mL (ref 5.0–15.0)

## 2021-09-01 LAB — PROTEIN / CREATININE RATIO, URINE
CREATININE, URINE: 63.3 mg/dL
PROTEIN URINE: <6 mg/dL

## 2021-09-01 LAB — MAGNESIUM: MAGNESIUM: 2.7 mg/dL — ABNORMAL HIGH (ref 1.6–2.6)

## 2021-09-01 LAB — BILIRUBIN, DIRECT: BILIRUBIN DIRECT: 0.3 mg/dL (ref 0.00–0.30)

## 2021-09-01 LAB — PHOSPHORUS: PHOSPHORUS: 2.9 mg/dL (ref 2.4–5.1)

## 2021-09-02 LAB — VITAMIN D 25 HYDROXY: VITAMIN D, TOTAL (25OH): 25.7 ng/mL (ref 20.0–80.0)

## 2021-09-02 LAB — CMV DNA, QUANTITATIVE, PCR: CMV VIRAL LD: NOT DETECTED

## 2021-09-03 ENCOUNTER — Ambulatory Visit: Admit: 2021-09-03 | Discharge: 2021-09-04 | Payer: MEDICARE | Attending: Nephrology | Primary: Nephrology

## 2021-09-03 DIAGNOSIS — Z940 Kidney transplant status: Principal | ICD-10-CM

## 2021-09-03 DIAGNOSIS — D849 Immunodeficiency, unspecified: Principal | ICD-10-CM

## 2021-09-03 MED ORDER — MG-PLUS-PROTEIN 133 MG TABLET
ORAL_TABLET | Freq: Every day | ORAL | 11 refills | 30 days | Status: CP
Start: 2021-09-03 — End: 2022-09-03
  Filled 2021-09-14: qty 30, 30d supply, fill #0

## 2021-09-03 NOTE — Unmapped (Signed)
AOBP: right  arm large   cuff   Average:108/63  Pulse:77  1st reading:115/64  Pulse:76  2nd reading:102/64  Pulse:79  3rd reading:108/61  Pulse:77

## 2021-09-03 NOTE — Unmapped (Signed)
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          09/03/21 1418   BP: 108/63   Pulse: 77   Temp: 36.3 ??C (97.4 ??F)   Weight: 66.4 kg (146 lb 6.4 oz)   Height: 160 cm (5' 3)   PainSc: 0-No pain       This covering coordinator met with patient today in clinic  Pt appears well     Assessment  BP: 100-60s  Lightheaded: denies, no falls   BG: following with PCP recent A1C checked 5.7  Headache: none  Hand tremors: none  Numbness/tingling: none  Fevers: none  Chills/sweats: none  Shortness of breath: none  Chest pain or pressure: none  Palpitations: none  Abdominal pain: none  Heart burn: none  Nausea/vomiting: none  Diarrhea/constipation: none  UTI symptoms: none  Swelling: none  Sleep: normal   Pain: joint pain    Good appetite; reports adequate hydration.     Intake: 2000 mg at min..   Output: normal    Any new medications? none  Immunosuppressant last taken: this morning 0730 am    Immunization status: received flu shot    Functional Score: 100   Normal no complaints; no evidence of  disease.   Employment status is: on disability    I spent a total of 5 minutes with Louis Dennis reviewing medications and symptoms.      Updates to include: Patient to STOP Allopurinol, biotin, and omeprazole   Revised Magnesium supplement to ONCE daily and if normal Mg levels can be discontinued  Pt inquired about traveling to [place] next year for 1 month and had verbal approval from Dr Nestor Lewandowsky at that time will need labs prior to traveling and upon return.     Pt reported getting his medications with no complications.

## 2021-09-06 LAB — VITAMIN D 1,25 DIHYDROXY: VITAMIN D 1,25-DIHYDROXY: 55 pg/mL

## 2021-09-07 LAB — HLA DS POST TRANSPLANT
ANTI-DONOR DRW #1 MFI: 32 MFI
ANTI-DONOR DRW #2 MFI: 0 MFI
ANTI-DONOR HLA-A #1 MFI: 0 MFI
ANTI-DONOR HLA-A #2 MFI: 0 MFI
ANTI-DONOR HLA-B #1 MFI: 0 MFI
ANTI-DONOR HLA-B #2 MFI: 0 MFI
ANTI-DONOR HLA-C #1 MFI: 0 MFI
ANTI-DONOR HLA-C #2 MFI: 0 MFI
ANTI-DONOR HLA-DQB #1 MFI: 29 MFI
ANTI-DONOR HLA-DQB #2 MFI: 142 MFI
ANTI-DONOR HLA-DR #1 MFI: 0 MFI
ANTI-DONOR HLA-DR #2 MFI: 13 MFI

## 2021-09-07 LAB — FSAB CLASS 2 ANTIBODY SPECIFICITY: HLA CL2 AB RESULT: NEGATIVE

## 2021-09-07 LAB — FSAB CLASS 1 ANTIBODY SPECIFICITY: HLA CLASS 1 ANTIBODY RESULT: NEGATIVE

## 2021-09-08 NOTE — Unmapped (Signed)
[facility] Clinical Assessment & Refill Coordination Note    Louis Dennis, Meta: [birth year]  Phone: [phone] (home)     All above HIPAA information was verified with patient.     Was a Nurse, learning disability used for this call? No    Specialty Medication(s):   Transplant: Envarsus 1mg  and Myfortic 180mg      Current Outpatient Medications   Medication Sig Dispense Refill   ??? magnesium oxide-Mg AA chelate (MAGNESIUM, AMINO ACID CHELATE,) 133 mg Take 1 tablet by mouth daily. 30 tablet 11   ??? metFORMIN (GLUCOPHAGE-XR) 500 MG 24 hr tablet TAKE 1 TABLET (500 MG TOTAL) BY MOUTH DAILY WITH EVENING MEAL. 90 tablet 3   ??? metoprolol succinate (TOPROL XL) 25 MG 24 hr tablet Take 1 tablet (25 mg total) by mouth nightly. 30 tablet 11   ??? MYFORTIC 180 mg EC tablet Take 3 tablets (540 mg total) by mouth Two (2) times a day. 180 tablet 11   ??? tacrolimus (ENVARSUS XR) 1 mg Tb24 extended release tablet Take 2 tablets (2 mg total) by mouth in the morning. 60 tablet 5     No current facility-administered medications for this visit.        Changes to medications: Louis [place] reports no changes at this time.    No Known Allergies    Changes to allergies: No    SPECIALTY MEDICATION ADHERENCE     Envarsus Xr 1 mg: 13 days of medicine on hand   Myfortic 180 mg: 13 days of medicine on hand       Medication Adherence    Patient reported X missed doses in the last month: 0  Specialty Medication: Envarsus Xr 1mg   Patient is on additional specialty medications: Yes  Additional Specialty Medications: Myfortic 180mg   Patient Reported Additional Medication X Missed Doses in the Last Month: 0  Patient is on more than two specialty medications: No          Specialty medication(s) dose(s) confirmed: Regimen is correct and unchanged.     Are there any concerns with adherence? No    Adherence counseling provided? Not needed    CLINICAL MANAGEMENT AND INTERVENTION      Clinical Benefit Assessment:    Do you feel the medicine is effective or helping your condition? Yes    Clinical Benefit counseling provided? Not needed    Adverse Effects Assessment:    Are you experiencing any side effects? No    Are you experiencing difficulty administering your medicine? No    Quality of Life Assessment:         How many days over the past month did your kidney transplant  keep you from your normal activities? For example, brushing your teeth or getting up in the morning. 0    Have you discussed this with your provider? Not needed    Acute Infection Status:    Acute infections noted within Epic:  No active infections  Patient reported infection: None    Therapy Appropriateness:    Is therapy appropriate and patient progressing towards therapeutic goals? Yes, therapy is appropriate and should be continued    DISEASE/MEDICATION-SPECIFIC INFORMATION      N/A    PATIENT SPECIFIC NEEDS     - Does the patient have any physical, cognitive, or cultural barriers? No    - Is the patient high risk? Yes, patient is taking a REMS drug. Medication is dispensed in compliance with REMS program    - Does  the patient require a Care Management Plan? No     - Does the patient require physician intervention or other additional services (i.e. nutrition, smoking cessation, social work)? No      SHIPPING     Specialty Medication(s) to be Shipped:   Transplant: Envarsus 1mg  and Myfortic 180mg     Other medication(s) to be shipped: Mg, Metoprolol     Changes to insurance: No    Delivery Scheduled: Yes, Expected medication delivery date: 09/15/21.     Medication will be delivered via Next Day Courier to the confirmed prescription address in [facility].    The patient will receive a drug information handout for each medication shipped and additional FDA Medication Guides as required.  Verified that patient has previously received a Conservation officer, historic buildings and a Surveyor, mining.    The patient or caregiver noted above participated in the development of this care plan and knows that they can request review of or adjustments to the care plan at any time.      All of the patient's questions and concerns have been addressed.    Tera Helper   [facility] Specialty Pharmacist

## 2021-09-14 MED FILL — MYFORTIC 180 MG TABLET,DELAYED RELEASE: ORAL | 30 days supply | Qty: 180 | Fill #8

## 2021-09-14 MED FILL — ENVARSUS XR 1 MG TABLET,EXTENDED RELEASE: ORAL | 30 days supply | Qty: 60 | Fill #2

## 2021-09-14 MED FILL — METOPROLOL SUCCINATE ER 25 MG TABLET,EXTENDED RELEASE 24 HR: ORAL | 30 days supply | Qty: 30 | Fill #6

## 2021-10-06 NOTE — Unmapped (Signed)
[facility] Refill Coordination Note    Specialty Medication(s) to be Shipped:   Transplant: Envarsus 1mg  and Myfortic 180mg     Other medication(s) to be shipped: magnesium and metoprolol     Louis Dennis, DOB: May 17, 1961  Phone: [phone] (home)       All above HIPAA information was verified with patient.     Was a Nurse, learning disability used for this call? No    Completed refill call assessment today to schedule patient's medication shipment from the [facility] [phone]).  All relevant notes have been reviewed.     Specialty medication(s) and dose(s) confirmed: Regimen is correct and unchanged.   Changes to medications: Louis Dennis reports no changes at this time.  Changes to insurance: No  New side effects reported not previously addressed with a pharmacist or physician: None reported  Questions for the pharmacist: No    Confirmed patient received a Conservation officer, historic buildings and a Surveyor, mining with first shipment. The patient will receive a drug information handout for each medication shipped and additional FDA Medication Guides as required.       DISEASE/MEDICATION-SPECIFIC INFORMATION        N/A    SPECIALTY MEDICATION ADHERENCE     Medication Adherence    Patient reported X missed doses in the last month: 0  Specialty Medication: Envarsus 1mg   Patient is on additional specialty medications: Yes  Additional Specialty Medications: Myfortic 180mg   Patient Reported Additional Medication X Missed Doses in the Last Month: 0  Patient is on more than two specialty medications: No        Were doses missed due to medication being on hold? No    Envarsus 1 mg: 9 days of medicine on hand   Myfortic 180 mg: 9 days of medicine on hand     REFERRAL TO PHARMACIST     Referral to the pharmacist: Not needed      [facility]     Shipping address confirmed in Epic.     Delivery Scheduled: Yes, Expected medication delivery date: 10/13/2021.     Medication will be delivered via Next Day Courier to the prescription address in Epic WAM.    Louis Dennis   [facility] Specialty Technician

## 2021-10-08 DIAGNOSIS — Z940 Kidney transplant status: Principal | ICD-10-CM

## 2021-10-11 ENCOUNTER — Ambulatory Visit: Admit: 2021-10-11 | Discharge: 2021-10-12 | Payer: MEDICARE

## 2021-10-11 LAB — BASIC METABOLIC PANEL
ANION GAP: 11 mmol/L (ref 5–14)
BLOOD UREA NITROGEN: 11 mg/dL (ref 9–23)
BUN / CREAT RATIO: 15
CALCIUM: 9.6 mg/dL (ref 8.7–10.4)
CHLORIDE: 106 mmol/L (ref 98–107)
CO2: 25.4 mmol/L (ref 20.0–31.0)
CREATININE: 0.71 mg/dL
EGFR CKD-EPI (2021) MALE: >90 mL/min/{1.73_m2} (ref >=60)
GLUCOSE RANDOM: 123 mg/dL (ref 70–179)
POTASSIUM: 4.6 mmol/L (ref 3.4–4.8)
SODIUM: 142 mmol/L (ref 135–145)

## 2021-10-11 LAB — CBC W/ AUTO DIFF
BASOPHILS ABSOLUTE COUNT: 0 10*9/L (ref 0.0–0.1)
BASOPHILS RELATIVE PERCENT: 0.8 %
EOSINOPHILS ABSOLUTE COUNT: 0.4 10*9/L (ref 0.0–0.5)
EOSINOPHILS RELATIVE PERCENT: 8.3 %
HEMATOCRIT: 45.5 % (ref 39.0–48.0)
HEMOGLOBIN: 14.8 g/dL (ref 12.9–16.5)
LYMPHOCYTES ABSOLUTE COUNT: 0.5 10*9/L — ABNORMAL LOW (ref 1.1–3.6)
LYMPHOCYTES RELATIVE PERCENT: 11.5 %
MEAN CORPUSCULAR HEMOGLOBIN CONC: 32.5 g/dL (ref 32.0–36.0)
MEAN CORPUSCULAR HEMOGLOBIN: 30.3 pg (ref 25.9–32.4)
MEAN CORPUSCULAR VOLUME: 93.4 fL (ref 77.6–95.7)
MEAN PLATELET VOLUME: 8.3 fL (ref 6.8–10.7)
MONOCYTES ABSOLUTE COUNT: 0.3 10*9/L (ref 0.3–0.8)
MONOCYTES RELATIVE PERCENT: 7.7 %
NEUTROPHILS ABSOLUTE COUNT: 3.2 10*9/L (ref 1.8–7.8)
NEUTROPHILS RELATIVE PERCENT: 71.7 %
NUCLEATED RED BLOOD CELLS: 0 /100{WBCs} (ref <=4)
PLATELET COUNT: 178 10*9/L (ref 150–450)
RED BLOOD CELL COUNT: 4.87 10*12/L (ref 4.26–5.60)
RED CELL DISTRIBUTION WIDTH: 14.2 % (ref 12.2–15.2)
WBC ADJUSTED: 4.5 10*9/L (ref 3.6–11.2)

## 2021-10-11 LAB — PHOSPHORUS: PHOSPHORUS: 3.4 mg/dL (ref 2.4–5.1)

## 2021-10-11 LAB — MAGNESIUM: MAGNESIUM: 1.8 mg/dL (ref 1.6–2.6)

## 2021-10-11 LAB — TACROLIMUS LEVEL, TROUGH: TACROLIMUS, TROUGH: 9.9 ng/mL (ref 5.0–15.0)

## 2021-10-12 MED FILL — ENVARSUS XR 1 MG TABLET,EXTENDED RELEASE: ORAL | 30 days supply | Qty: 60 | Fill #3

## 2021-10-12 MED FILL — MG-PLUS-PROTEIN 133 MG TABLET: ORAL | 30 days supply | Qty: 30 | Fill #1

## 2021-10-12 MED FILL — MYFORTIC 180 MG TABLET,DELAYED RELEASE: ORAL | 30 days supply | Qty: 180 | Fill #9

## 2021-10-12 MED FILL — METOPROLOL SUCCINATE ER 25 MG TABLET,EXTENDED RELEASE 24 HR: ORAL | 30 days supply | Qty: 30 | Fill #7

## 2021-11-05 NOTE — Unmapped (Signed)
[facility] Refill Coordination Note    Specialty Medication(s) to be Shipped:   Transplant: Envarsus 1mg  and Myfortic 180mg     Other medication(s) to be shipped: metoprolol and magnesium     Louis Dennis, DOB: 11/13/1961  Phone: [phone] (home)       All above HIPAA information was verified with patient.     Was a Nurse, learning disability used for this call? No    Completed refill call assessment today to schedule patient's medication shipment from the [facility] [phone]).  All relevant notes have been reviewed.     Specialty medication(s) and dose(s) confirmed: Regimen is correct and unchanged.   Changes to medications: [name] reports no changes at this time.  Changes to insurance: No  New side effects reported not previously addressed with a pharmacist or physician: None reported  Questions for the pharmacist: No    Confirmed patient received a Conservation officer, historic buildings and a Surveyor, mining with first shipment. The patient will receive a drug information handout for each medication shipped and additional FDA Medication Guides as required.       DISEASE/MEDICATION-SPECIFIC INFORMATION        N/A    SPECIALTY MEDICATION ADHERENCE     Medication Adherence    Patient reported X missed doses in the last month: 0  Specialty Medication: Envarsus 1mg   Patient is on additional specialty medications: Yes  Additional Specialty Medications: Myfortic 180mg   Patient Reported Additional Medication X Missed Doses in the Last Month: 0  Patient is on more than two specialty medications: No        Were doses missed due to medication being on hold? No    Envarsus 1 mg: 8 days of medicine on hand   Myfortic 180 mg: 8 days of medicine on hand     REFERRAL TO PHARMACIST     Referral to the pharmacist: Not needed      [facility]     Shipping address confirmed in Epic.     Delivery Scheduled: Yes, Expected medication delivery date: 11/11/2021.     Medication will be delivered via Next Day Courier to the prescription address in Epic WAM.    Louis Dennis   [facility] Specialty Technician

## 2021-11-09 ENCOUNTER — Ambulatory Visit: Admit: 2021-11-09 | Discharge: 2021-11-10 | Payer: MEDICARE

## 2021-11-09 LAB — BASIC METABOLIC PANEL
ANION GAP: 8 mmol/L (ref 5–14)
BLOOD UREA NITROGEN: 16 mg/dL (ref 9–23)
BUN / CREAT RATIO: 24
CALCIUM: 9.5 mg/dL (ref 8.7–10.4)
CHLORIDE: 106 mmol/L (ref 98–107)
CO2: 25.1 mmol/L (ref 20.0–31.0)
CREATININE: 0.66 mg/dL
EGFR CKD-EPI (2021) MALE: >90 mL/min/{1.73_m2} (ref >=60)
GLUCOSE RANDOM: 123 mg/dL (ref 70–179)
POTASSIUM: 4.6 mmol/L (ref 3.4–4.8)
SODIUM: 139 mmol/L (ref 135–145)

## 2021-11-09 LAB — CBC W/ AUTO DIFF
BASOPHILS ABSOLUTE COUNT: 0.1 10*9/L (ref 0.0–0.1)
BASOPHILS RELATIVE PERCENT: 1.1 %
EOSINOPHILS ABSOLUTE COUNT: 0.4 10*9/L (ref 0.0–0.5)
EOSINOPHILS RELATIVE PERCENT: 8.7 %
HEMATOCRIT: 43.7 % (ref 39.0–48.0)
HEMOGLOBIN: 14.4 g/dL (ref 12.9–16.5)
LYMPHOCYTES ABSOLUTE COUNT: 0.5 10*9/L — ABNORMAL LOW (ref 1.1–3.6)
LYMPHOCYTES RELATIVE PERCENT: 10.5 %
MEAN CORPUSCULAR HEMOGLOBIN CONC: 33 g/dL (ref 32.0–36.0)
MEAN CORPUSCULAR HEMOGLOBIN: 30.7 pg (ref 25.9–32.4)
MEAN CORPUSCULAR VOLUME: 93 fL (ref 77.6–95.7)
MEAN PLATELET VOLUME: 9.2 fL (ref 6.8–10.7)
MONOCYTES ABSOLUTE COUNT: 0.4 10*9/L (ref 0.3–0.8)
MONOCYTES RELATIVE PERCENT: 8.5 %
NEUTROPHILS ABSOLUTE COUNT: 3.7 10*9/L (ref 1.8–7.8)
NEUTROPHILS RELATIVE PERCENT: 71.2 %
NUCLEATED RED BLOOD CELLS: 0 /100{WBCs} (ref <=4)
PLATELET COUNT: 176 10*9/L (ref 150–450)
RED BLOOD CELL COUNT: 4.7 10*12/L (ref 4.26–5.60)
RED CELL DISTRIBUTION WIDTH: 14.5 % (ref 12.2–15.2)
WBC ADJUSTED: 5.2 10*9/L (ref 3.6–11.2)

## 2021-11-09 LAB — PHOSPHORUS: PHOSPHORUS: 3 mg/dL (ref 2.4–5.1)

## 2021-11-09 LAB — MAGNESIUM: MAGNESIUM: 1.9 mg/dL (ref 1.6–2.6)

## 2021-11-10 LAB — TACROLIMUS LEVEL, TROUGH: TACROLIMUS, TROUGH: 6.6 ng/mL (ref 5.0–15.0)

## 2021-11-10 MED FILL — ENVARSUS XR 1 MG TABLET,EXTENDED RELEASE: ORAL | 30 days supply | Qty: 60 | Fill #4

## 2021-11-10 MED FILL — MG-PLUS-PROTEIN 133 MG TABLET: ORAL | 30 days supply | Qty: 30 | Fill #2

## 2021-11-10 MED FILL — MYFORTIC 180 MG TABLET,DELAYED RELEASE: ORAL | 30 days supply | Qty: 180 | Fill #10

## 2021-11-10 MED FILL — METOPROLOL SUCCINATE ER 25 MG TABLET,EXTENDED RELEASE 24 HR: ORAL | 30 days supply | Qty: 30 | Fill #8

## 2021-11-22 MED ORDER — METFORMIN ER 500 MG TABLET,EXTENDED RELEASE 24 HR
ORAL_TABLET | 3 refills | 0 days
Start: 2021-11-22 — End: ?

## 2021-12-03 NOTE — Unmapped (Signed)
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    Diagnoses and all orders for this visit:    Renovascular hypertension  BP??at goal??(102/62??in clinic today). Continue metoprolol 25 mg HS. Reviewed low sodium diet and encouraged regular exercise. Advised to continue to monitor and log at-home BP readings    Pre-diabetes  HGB A1c??5.7 today??(unchanged from 08/30/21).   DM??well controlled.??Managed by Nephrology.??Will try discontinuing metformin 500 mg daily per suggestion from his nephrologist and recheck A1c in 3 months.   Encouraged patient to continue carb controlled diet and regular exercise.??Continue to monitor home blood sugar values and let me know if values rise above 130-140.   -     POCT glycosylated hemoglobin (Hb A1C); Future  -     POCT glycosylated hemoglobin (Hb A1C)    Pain in toes of both feet  Resolved, advised him to follow up if he has any symptom recurrence.     Need for meningococcus vaccine  Will follow up at next visit regarding this.       HPI:      Louis Dennis  is here for   Chief Complaint   Patient presents with   ??? Renovascular hypertension     Fasting.  No concerns.   ??? Prediabetes     Kidney doctor wants to know if he needs to continue with Metformin since A1C is so good.     Hypertension: Patient presents for follow-up of hypertension. Blood pressure goal < 140/90.  Hypertension has customarily been at goal complicated by renal disease (post transplant).  Home blood pressure readings: 100-110/60's. Salt intake and diet: salt not added to cooking and salt shaker not on table. Associated signs and symptoms: none. Patient denies: blurred vision, chest pain, dyspnea, headache, neck aches, orthopnea, palpitations, paroxysmal nocturnal dyspnea, peripheral edema, pulsating in the ears and tiredness/fatigue. Medication compliance: taking as prescribed. He is doing regular exercise. BP Readings from Last 3 Encounters:   12/07/21 102/62   09/03/21 108/63   08/30/21 102/64     Prediabetes: Patient presents??for follow up of??prediabetes.????A1C goal is <8. ??Current symptoms include: none. Symptoms have??been well-controlled. Patient denies??foot ulcerations, hyperglycemia, hypoglycemia , increase appetite, nausea, paresthesia of the feet, polydipsia, polyuria, visual disturbances, vomitting and weight loss. Evaluation to date has included:??hemoglobin A1C. ??Home sugars: Between 100-115,in the morning, sometimes in the 90s. Has transitioned from eating 3 times a day to twice daily.  Current treatment:??Continued??metformin??which has been effective, dietary modifications.??Following with Nephrology every 3 months for management as well (next appointment 1/5). Patient reports his Nephrologist suggested he may be able to discontinue metformin as his blood sugars have been consistently in a normal range in the past year.   ??  Toe pain: Patient with a Hx of gout reported pain and sensitivity of bilateral 5th toes when wearing shoes at last visit 08/30/21. I had advised he trial callous cushions for 3 months until his next scheduled follow up and if his pain did not improve that I would recommend a Podiatry referral at that time. Today patient reports toe pain has resolved. He denies any recent gout flares. He denies pain when he is not wearing shoes (in the home). Wears wide shoes. No injury to feet/toes. Denies numbness or tingling.     GERD: Patient has a history of GERD. Reports no breakthrough GERD symptoms on current regimen and denies dysphagia, abdominal  pain, or melena. No longer taking any medications for this, primarily controlled through diet.     Wife has returned to the country after 2 month trip. She is doing well.     PCMH Components:      Goals     ??? Exercise at least 3x per week (30 min per time)      May 06, 2020 8:35 AM AWV Goal   Things to think about to help me reach my goal:     What are you going to do?  increase strength   How and how much? Exercise, weight training   How frequent? daily   Barriers to success? Fatigue, occasional dizziness    Solutions to barriers? n/a                   Medication adherence and barriers to the treatment plan have been addressed. Opportunities to optimize healthy behaviors have been discussed. Patient / caregiver voiced understanding.      Past Medical/Surgical History:     Past Medical History:   Diagnosis Date   ??? Abnormal thyroid function test    ??? End stage renal disease (CMS-HCC)     now on peritoneal dialysis   ??? Gout     reports was in Right hand, foot and left elbow   ??? Hypertension     takes meds intermitttently based on readings   ??? Nephrolithiasis 2014   ??? Visual impairment     glasses     Past Surgical History:   Procedure Laterality Date   ??? OTHER SURGICAL HISTORY Right 08/2017    Pt had catheter moved from left side to right side.   ??? pd catheter  2012   ??? PR COLONOSCOPY W/BIOPSY SINGLE/MULTIPLE N/A 06/15/2018    Procedure: COLONOSCOPY, FLEXIBLE, PROXIMAL TO SPLENIC FLEXURE; WITH BIOPSY, SINGLE OR MULTIPLE;  Surgeon: Charm Rings, MD;  [facility];  Service: Gastroenterology   ??? PR COLSC FLX W/RMVL OF TUMOR POLYP LESION SNARE TQ N/A 06/15/2018    Procedure: COLONOSCOPY FLEX; W/REMOV TUMOR/LES BY SNARE;  Surgeon: Charm Rings, MD;  Location: [facility];  Service: Gastroenterology   ??? PR EXPLORATORY OF ABDOMEN N/A 11/22/2018    Procedure: EXPLORATORY LAPAROTOMY, EXPLORATORY CELIOTOMY WITH OR WITHOUT BIOPSY(S);  Surgeon: Lawrence Marseilles Day Thurnell Lose, MD;  Location: [facility];  Service: Trauma   ??? PR EXPLORE PARATHYROID GLANDS N/A 11/11/2019    Procedure: RS 22 PARATHYROIDECTOMY OR EXPLORATION OF PARATHYROID(S);  Surgeon: Johny Chess, MD;  Location: MAIN OR [facility];  Service: Surgical Oncology   ??? PR FREEING BOWEL ADHESION,ENTEROLYSIS N/A 11/22/2018    Procedure: Enterolysis (Separt Proc); Surgeon: Lawrence Marseilles Day Thurnell Lose, MD;  Location: MAIN OR [facility];  Service: Trauma   ??? PR REMOVE PERITONEAL FOREIGN BODY N/A 11/22/2018    Procedure: Removal Of Peritoneal Of Foreign Body From Peritoneal Cavity;  Surgeon: Lawrence Marseilles Day Thurnell Lose, MD;  Location: [facility] OR [facility];  Service: Trauma   ??? PR TRANSPLANT,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    Procedure: [facility] STD PREP CAD DONR RENAL ALLOGFT PRIOR TO TRNSPLNT, INCL DISSEC/REM PERINEPH FAT, DIAPH/RTPER ATTAC;  Surgeon: Doyce Loose, MD;  Location: MAIN OR [facility];  Service: Transplant   ??? PR TRANSPLANTATION OF KIDNEY N/A 05/24/2019    Procedure: RENAL ALLOTRANSPLANTATION, IMPLANTATION OF GRAFT; WITHOUT RECIPIENT NEPHRECTOMY;  Surgeon: Doyce Loose, MD;  Location: MAIN OR [facility];  Service: Transplant       Family  History:     Family History   Problem Relation Age of Onset   ??? Hypertension Mother    ??? Kidney disease Mother    ??? Diabetes Mother    ??? Hypertension Father    ??? Kidney disease Sister         s/p transplant   ??? Hypertension Brother    ??? Glaucoma Neg Hx    ??? Amblyopia Neg Hx    ??? Blindness Neg Hx    ??? Retinal detachment Neg Hx    ??? Strabismus Neg Hx    ??? Macular degeneration Neg Hx    ??? Basal cell carcinoma Neg Hx    ??? Melanoma Neg Hx    ??? Squamous cell carcinoma Neg Hx        Social History:     Social History     Socioeconomic History   ??? Marital status: Married     Spouse name: None   ??? Number of children: 2   ??? Years of education: 97   ??? Highest education level: None   Tobacco Use   ??? Smoking status: Former     Packs/day: 1.00     Years: 30.00     Pack years: 30.00     Types: Cigarettes     Quit date: 07/10/2009     Years since quitting: 12.4   ??? Smokeless tobacco: Never   Vaping Use   ??? Vaping Use: Never used   Substance and Sexual Activity   ??? Alcohol use: No     Alcohol/week: 0.0 standard drinks   ??? Drug use: No   ??? Sexual activity: Not Currently   Other Topics Concern   ??? Do you use sunscreen? Yes   ??? Tanning bed use? No   ??? Are you easily burned? No   ??? Excessive sun exposure? No   ??? Blistering sunburns? No     Social Determinants of Health     Financial Resource Strain: Low Risk    ??? Difficulty of Paying Living Expenses: Not hard at all   Food Insecurity: No Food Insecurity   ??? Worried About Programme researcher, broadcasting/film/video in the Last Year: Never true   ??? Ran Out of Food in the Last Year: Never true   Transportation Needs: No Transportation Needs   ??? Lack of Transportation (Medical): No   ??? Lack of Transportation (Non-Medical): No       Allergies:     Patient has no known allergies.    Current Medications:     Current Outpatient Medications   Medication Sig Dispense Refill   ??? magnesium oxide-Mg AA chelate (MAGNESIUM, AMINO ACID CHELATE,) 133 mg Take 1 tablet by mouth daily. 30 tablet 11   ??? metFORMIN (GLUCOPHAGE-XR) 500 MG 24 hr tablet TAKE 1 TABLET (500 MG TOTAL) BY MOUTH DAILY WITH EVENING MEAL. 90 tablet 3   ??? metoprolol succinate (TOPROL XL) 25 MG 24 hr tablet Take 1 tablet (25 mg total) by mouth nightly. 30 tablet 11   ??? MYFORTIC 180 mg EC tablet Take 3 tablets (540 mg total) by mouth Two (2) times a day. 180 tablet 11   ??? tacrolimus (ENVARSUS XR) 1 mg Tb24 extended release tablet Take 2 tablets (2 mg total) by mouth in the morning. 60 tablet 5     No current facility-administered medications for this visit.       Health Maintenance:     Health Maintenance Summary w/Most Recent Date    -  Overdue - Meningococcal B Vaccines (1 of 4 - Increased Risk Bexsero 2-dose series) Overdue - never done    No completion history exists for this topic.          Overdue - COVID-19 Vaccine (5 - Booster for Moderna series) Overdue since 05/12/2021    03/17/2021  Imm Admin: COVID-19 VACCINE,MRNA(MODERNA)(PF)(IM)    08/15/2020  Imm Admin: COVID-19 VACCINE,MRNA(MODERNA)(PF)(IM)    05/10/2020  Imm Admin: COVID-19 VACCINE,MRNA(MODERNA)(PF)(IM)    04/12/2020  Imm Admin: COVID-19 VACCINE,MRNA(MODERNA)(PF)(IM)          Lipid Screening (Every 5 Years) Next due on 09/01/2026 09/01/2021  SmartData: [facility] TOTAL CHOLESTEROL AND HDL COMPLETE    09/01/2021  Lipid panel    06/02/2021  Lipid panel    06/12/2020  Lipid panel    09/02/2019  Lipid panel    Only the first 5 history entries have been loaded, but more history exists.          Colonoscopy (Every 10 Years) Next due on 06/15/2028    06/15/2018  Surgical Procedure: PR COLONOSCOPY W/BIOPSY SINGLE/MULTIPLE    06/15/2018  Surgical Procedure: PR COLSC FLX W/RMVL OF TUMOR POLYP LESION SNARE TQ    06/15/2018  Colonoscopy    02/26/2013  Colonoscopy (Historical Result)          DTaP/Tdap/Td Vaccines (2 - Td or Tdap) Next due on 12/30/2029    12/31/2019  Imm Admin: TdaP          Hepatitis C Screen  Completed    06/12/2020  Hepatitis C Ab component of Hepatitis C Antibody    10/21/2019  Hepatitis C Ab component of Hepatitis C Antibody    05/23/2019  Hepatitis C Ab component of Hepatitis C Antibody    04/02/2019  Hepatitis C Ab component of Hepatitis C Antibody    03/01/2019  Hepatitis C Ab component of Hepatitis C Antibody    Only the first 5 history entries have been loaded, but more history exists.          Pneumococcal Vaccine 0-64 (Series Information) Completed    08/30/2021  Imm Admin: Pneumococcal Conjugate 20-valent    07/15/2015  Imm Admin: Pneumococcal Conjugate 13-Valent    07/06/2015  Imm Admin: PNEUMOCOCCAL POLYSACCHARIDE 23    07/24/2010  Imm Admin: Pneumococcal Conjugate 13-Valent          Influenza Vaccine (Series Information) Completed    08/30/2021  Imm Admin: INFLUENZA INJ MDCK PF, QUAD,(FLUCELVAX)(5MO AND UP EGG FREE)    08/31/2020  Imm Admin: INFLUENZA INJ MDCK PF, QUAD,(FLUCELVAX)(5MO AND UP EGG FREE)    08/06/2019  Imm Admin: Influenza Vaccine Quad (IIV4 PF) 84mo+ injectable    10/02/2018  Imm Admin: Influenza Virus Vaccine, unspecified formulation    09/04/2018  Imm Admin: Influenza Virus Vaccine, unspecified formulation    Only the first 5 history entries have been loaded, but more history exists.          Discontinued - Zoster Vaccines  Discontinued    No completion history exists for this topic.                Immunizations:     Immunization History   Administered Date(s) Administered   ??? COVID-19 VACCINE,MRNA(MODERNA)(PF) 04/12/2020, 05/10/2020, 08/15/2020, 03/17/2021   ??? INFLUENZA INJ MDCK PF, QUAD,(FLUCELVAX)(5MO AND UP EGG FREE) 09/04/2017, 08/31/2020, 08/30/2021   ??? INFLUENZA TIV (TRI) PF (IM) 08/17/2011, 08/08/2012, 08/05/2013, 08/28/2014, 08/11/2015, 08/29/2015   ??? Influenza Vaccine Quad (IIV4 PF) 61mo+ injectable 08/16/2015, 09/06/2016, 09/11/2017, 08/06/2019   ???  Influenza Virus Vaccine, unspecified formulation 08/30/2016, 09/18/2017, 09/04/2018, 10/02/2018   ??? PNEUMOCOCCAL POLYSACCHARIDE 23 07/06/2015   ??? PPD Test 09/08/2011, 09/07/2012, 09/05/2013, 09/09/2014, 09/07/2015, 08/10/2016, 09/11/2017, 09/05/2018, 01/03/2019   ??? Pneumococcal Conjugate 13-Valent 07/24/2010, 07/15/2015   ??? Pneumococcal Conjugate 20-valent 08/30/2021   ??? TdaP 12/31/2019       I have reviewed and (if needed) updated the patient's problem list, medications, allergies, past medical and surgical history, social and family history.    ROS:      Constitutional:  Denies  unexpected weight loss or gain, or weakness   Eyes:  Denies visual changes  Respiratory:  Denies cough or shortness of breath. No change in exercise  tolerance  Cardiovascular:  Denies chest pain, palpitations or lower extremity swelling   GI:  Denies abdominal pain, diarrhea, constipation   Musculoskeletal:  Denies myalgias  Skin:  Denies nonhealing lesions  Neurologic:  Denies headache, focal weakness or numbness, tingling  Endocrine:  Denies polyuria or polydipsia   Psychiatric:  Denies depression, anxiety    Vital Signs:     Wt Readings from Last 3 Encounters:   12/07/21 65.8 kg (145 lb)   09/03/21 66.4 kg (146 lb 6.4 oz)   08/30/21 65.8 kg (145 lb)     Temp Readings from Last 3 Encounters:   12/07/21 36.7 ??C (98 ??F) (Oral)   09/03/21 36.3 ??C (97.4 ??F) (Temporal)   08/30/21 36.7 ??C (98.1 ??F) (Oral)     BP Readings from Last 3 Encounters:   12/07/21 102/62   09/03/21 108/63   08/30/21 102/64     Pulse Readings from Last 3 Encounters:   12/07/21 79   09/03/21 77   08/30/21 90     Estimated body mass index is 25.69 kg/m?? as calculated from the following:    Height as of this encounter: 160 cm (5' 3).    Weight as of this encounter: 65.8 kg (145 lb).  Facility age limit for growth percentiles is 20 years.        Objective:      General: Well developed. Well nourished. No marked weight fluActuation.  HEENT:  Normocephalic.  Atraumatic.     Neck:  No thyroid enlargement, carotid bruits  Heart:  Regular rate and rhythm . No murmurs or gallops  Lungs:  No respiratory distress.  Lungs clear to auscultation.  Abdomen: Soft, +BS, non distended, non tender, no masses or Organomegaly  Extremities:  No edema. Peripheral pulses normal  Skin:  Warm, dry, no nonhealing lesions  Neuro:  Non-focal. No obvious weakness.   Psych:  Affect normal, eye contact good, speech clear and coherent.        I attest that I, Benita Stabile, personally documented this note while acting as scribe for Noralyn Pick, FNP.      Benita Stabile, Scribe.  12/07/2021     The documentation recorded by the scribe accurately reflects the service I personally performed and the decisions made by me.     Noralyn Pick, FNP

## 2021-12-07 ENCOUNTER — Ambulatory Visit: Admit: 2021-12-07 | Discharge: 2021-12-08 | Payer: MEDICARE | Attending: Family | Primary: Family

## 2021-12-07 DIAGNOSIS — Z23 Encounter for immunization: Principal | ICD-10-CM

## 2021-12-07 DIAGNOSIS — M79674 Pain in right toe(s): Principal | ICD-10-CM

## 2021-12-07 DIAGNOSIS — I150 Renovascular hypertension: Principal | ICD-10-CM

## 2021-12-07 DIAGNOSIS — M79675 Pain in left toe(s): Principal | ICD-10-CM

## 2021-12-07 DIAGNOSIS — R7303 Prediabetes: Principal | ICD-10-CM

## 2021-12-07 MED ORDER — METFORMIN ER 500 MG TABLET,EXTENDED RELEASE 24 HR
ORAL_TABLET | Freq: Every day | ORAL | 3 refills | 90 days | Status: CN
Start: 2021-12-07 — End: 2022-12-07

## 2021-12-07 MED FILL — MYFORTIC 180 MG TABLET,DELAYED RELEASE: ORAL | 30 days supply | Qty: 180 | Fill #11

## 2021-12-07 MED FILL — METOPROLOL SUCCINATE ER 25 MG TABLET,EXTENDED RELEASE 24 HR: ORAL | 30 days supply | Qty: 30 | Fill #9

## 2021-12-07 MED FILL — MG-PLUS-PROTEIN 133 MG TABLET: ORAL | 30 days supply | Qty: 30 | Fill #3

## 2021-12-07 MED FILL — ENVARSUS XR 1 MG TABLET,EXTENDED RELEASE: ORAL | 30 days supply | Qty: 60 | Fill #5

## 2021-12-08 ENCOUNTER — Ambulatory Visit: Admit: 2021-12-08 | Discharge: 2021-12-09 | Payer: MEDICARE

## 2021-12-08 DIAGNOSIS — R8290 Unspecified abnormal findings in urine: Principal | ICD-10-CM

## 2021-12-08 DIAGNOSIS — Z940 Kidney transplant status: Principal | ICD-10-CM

## 2021-12-08 LAB — PHOSPHORUS: PHOSPHORUS: 3.1 mg/dL (ref 2.4–5.1)

## 2021-12-08 LAB — CBC W/ AUTO DIFF
BASOPHILS ABSOLUTE COUNT: 0.1 10*9/L (ref 0.0–0.1)
BASOPHILS RELATIVE PERCENT: 1.2 %
EOSINOPHILS ABSOLUTE COUNT: 0.4 10*9/L (ref 0.0–0.5)
EOSINOPHILS RELATIVE PERCENT: 7.6 %
HEMATOCRIT: 44.1 % (ref 39.0–48.0)
HEMOGLOBIN: 14.4 g/dL (ref 12.9–16.5)
LYMPHOCYTES ABSOLUTE COUNT: 0.6 10*9/L — ABNORMAL LOW (ref 1.1–3.6)
LYMPHOCYTES RELATIVE PERCENT: 11.6 %
MEAN CORPUSCULAR HEMOGLOBIN CONC: 32.8 g/dL (ref 32.0–36.0)
MEAN CORPUSCULAR HEMOGLOBIN: 30.1 pg (ref 25.9–32.4)
MEAN CORPUSCULAR VOLUME: 91.8 fL (ref 77.6–95.7)
MEAN PLATELET VOLUME: 9 fL (ref 6.8–10.7)
MONOCYTES ABSOLUTE COUNT: 0.4 10*9/L (ref 0.3–0.8)
MONOCYTES RELATIVE PERCENT: 7.8 %
NEUTROPHILS ABSOLUTE COUNT: 3.5 10*9/L (ref 1.8–7.8)
NEUTROPHILS RELATIVE PERCENT: 71.8 %
NUCLEATED RED BLOOD CELLS: 0 /100{WBCs} (ref <=4)
PLATELET COUNT: 173 10*9/L (ref 150–450)
RED BLOOD CELL COUNT: 4.81 10*12/L (ref 4.26–5.60)
RED CELL DISTRIBUTION WIDTH: 14.5 % (ref 12.2–15.2)
WBC ADJUSTED: 4.9 10*9/L (ref 3.6–11.2)

## 2021-12-08 LAB — BASIC METABOLIC PANEL
ANION GAP: 9 mmol/L (ref 5–14)
BLOOD UREA NITROGEN: 17 mg/dL (ref 9–23)
BUN / CREAT RATIO: 27
CALCIUM: 9.4 mg/dL (ref 8.7–10.4)
CHLORIDE: 106 mmol/L (ref 98–107)
CO2: 24.9 mmol/L (ref 20.0–31.0)
CREATININE: 0.64 mg/dL
EGFR CKD-EPI (2021) MALE: >90 mL/min/{1.73_m2} (ref >=60)
GLUCOSE RANDOM: 125 mg/dL (ref 70–179)
POTASSIUM: 4.5 mmol/L (ref 3.4–4.8)
SODIUM: 140 mmol/L (ref 135–145)

## 2021-12-08 LAB — MAGNESIUM: MAGNESIUM: 2 mg/dL (ref 1.6–2.6)

## 2021-12-08 LAB — TACROLIMUS LEVEL, TROUGH: TACROLIMUS, TROUGH: 6.7 ng/mL (ref 5.0–15.0)

## 2021-12-08 NOTE — Unmapped (Signed)
Transplant Nephrology Clinic Visit    History of Present Illness  [name] is a 61 y.o. 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  -Adequate perfusion of the transplant kidney. Resistive indices within the mid main renal artery and at the anastomosis are slightly elevated. Attention on follow-up.  ??  Current Immunosuppression Regimen:   - Myfortic 540mg  BID  - Envarsus 2 mg daily    Subjective/Interval:   Since patient's last visit in the transplant clinic - patient has been doing well in terms of transplant, taking transplant medications regularly, no episodes of rejection and no side effects of medication.    Today patient is overall doing well. He saw family medicine 2 days ago and discontinued metformin. He denies nausea , vomiting, swelling, dysuria, tremors.      Review of Systems  Otherwise as per HPI, all other systems reviewed and are negative.    Medications  Current Outpatient Medications   Medication Sig Dispense Refill   ??? magnesium oxide-Mg AA chelate (MAGNESIUM, AMINO ACID CHELATE,) 133 mg Take 1 tablet by mouth daily. 30 tablet 11   ??? metFORMIN (GLUCOPHAGE-XR) 500 MG 24 hr tablet TAKE 1 TABLET (500 MG TOTAL) BY MOUTH DAILY WITH EVENING MEAL. 90 tablet 3   ??? metoprolol succinate (TOPROL XL) 25 MG 24 hr tablet Take 1 tablet (25 mg total) by mouth nightly. 30 tablet 11   ??? MYFORTIC 180 mg EC tablet Take 3 tablets (540 mg total) by mouth Two (2) times a day. 180 tablet 11   ??? tacrolimus (ENVARSUS XR) 1 mg Tb24 extended release tablet Take 2 tablets (2 mg total) by mouth in the morning. 60 tablet 5     No current facility-administered medications for this visit.     Physical Exam  BP 106/63 (BP Site: L Arm, BP Position: Sitting, BP Cuff Size: Large)  - Pulse 89  - Temp 36.3 ??C (97.3 ??F) (Temporal)  - Ht 160 cm (5' 2.99)  - Wt 66.2 kg (146 lb)  - BMI 25.87 kg/m??     Nursing note and vitals reviewed.   Constitutional: Oriented to person, place, and time. Appears well-developed and well-nourished. No distress.   HENT: wearing mask  Head: Normocephalic and atraumatic.   Eyes: Right eye exhibits no discharge. Left eye exhibits no discharge. No scleral icterus.   Neck: Normal range of motion. Neck supple.   Cardiovascular: Normal rate and regular rhythm. Exam reveals no gallop and no friction rub. No murmur heard.   Pulmonary/Chest: Effort normal and breath sounds normal. No respiratory distress.   Abdominal: Soft. Non tender  Musculoskeletal: Normal range of motion. No edema and  no tenderness.   Neurological: Alert and oriented to person, place, and time.       Laboratory Data and Imaging reviewed in EPIC    Assessment: [name] is a 61 y.o. 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       Recommendations/Plan:   Allograft Function: renal function stable at 0.64 with a baseline of approximately 0.7-0.9 since transplant. Will continue to monitor.  UPC is undetectable.    Immunosuppression Management [High Risk Medical Decision Making For Drug  Therapy Requiring Intensive Monitoring For Toxicity]:  Continue Envarsus 2 mg daily. Targeting tacrolimus trough level of approximately 5-7.  Continue Myfortic 540 mg BID. Tacrolimus trough 6.7(12/08/21).    Blood Pressure Management: BP 108/63 at this visit. Patient currently on Toprol-XL 25 mg at night.    Pre-diabetes: . Home BG overall controlled. Last A1c was 5.7 on 06/02/21. Discontinued metformin on 12/07/21 by family medicine.     Hypercalcemia: Last Ca WNL  - s/p parathyroid surgery - 11/11/2019    Gout: no flare or activity since ~2016. He will stop allopurinol.    Lower Back Pain with midline radiculopathy: Known underlying degenerative changes. Resolved per patient.    Left Knee & Right Shoulder Pain: (06/12/20)  - Patient following with PT every 3 weeks for shoulder pain. Notes improved pain and ROM.    Health Maintenance:   - Colonoscopy due 2029  - Blood work once a month    Immunizations:  - Flu shot: 08/30/21  - Prevnar-20: 08/30/21.  - COVID vaccine (Moderna): 04/12/20, 08/15/20, 09/06/20, 03/17/21. Evusheld in clinic (06/04/21).    Follow-Up: Return to clinic in 4 months.    Scribe's Attestation: Leeroy Bock, MD obtained and performed the history, physical exam and medical decision making elements that were entered into the chart.  Signed by Allie Dimmer, Scribe, on December 09, 2021 at 2:08 PM.    Documentation assistance provided by the Scribe. I was present during the time the encounter was recorded. The information recorded by the Scribe was done at my direction and has been reviewed and validated by me.

## 2021-12-09 ENCOUNTER — Ambulatory Visit: Admit: 2021-12-09 | Discharge: 2021-12-10 | Payer: MEDICARE | Attending: Nephrology | Primary: Nephrology

## 2021-12-09 DIAGNOSIS — Z940 Kidney transplant status: Principal | ICD-10-CM

## 2021-12-09 LAB — URINALYSIS
BACTERIA: NONE SEEN /HPF
BILIRUBIN UA: NEGATIVE
BLOOD UA: NEGATIVE
GLUCOSE UA: NEGATIVE
KETONES UA: NEGATIVE
LEUKOCYTE ESTERASE UA: NEGATIVE
NITRITE UA: NEGATIVE
PH UA: 5 (ref 5.0–9.0)
PROTEIN UA: NEGATIVE
RBC UA: <1 /HPF (ref <=3)
SPECIFIC GRAVITY UA: 1.018 (ref 1.003–1.030)
SQUAMOUS EPITHELIAL: <1 /HPF (ref 0–5)
UROBILINOGEN UA: <2
WBC UA: <1 /HPF (ref <=2)

## 2021-12-09 LAB — PROTEIN / CREATININE RATIO, URINE
CREATININE, URINE: 59.4 mg/dL
PROTEIN URINE: <6 mg/dL

## 2021-12-09 NOTE — Unmapped (Signed)
AOBP: Left  arm  Large cuff     1st reading:  106/64  90  2nd reading:  105/62  87  3rd reading:  ---      Average:  106/63  89    97.3

## 2022-01-05 DIAGNOSIS — D849 Immunodeficiency, unspecified: Principal | ICD-10-CM

## 2022-01-05 DIAGNOSIS — Z940 Kidney transplant status: Principal | ICD-10-CM

## 2022-01-05 MED ORDER — MYFORTIC 180 MG TABLET,DELAYED RELEASE
ORAL_TABLET | Freq: Two times a day (BID) | ORAL | 11 refills | 30.00000 days
Start: 2022-01-05 — End: 2023-01-05

## 2022-01-05 MED ORDER — TACROLIMUS XR 1 MG TABLET,EXTENDED RELEASE 24 HR
ORAL_TABLET | Freq: Every day | ORAL | 3 refills | 90.00000 days | Status: CP
Start: 2022-01-05 — End: 2023-01-05
  Filled 2022-01-07: qty 60, 30d supply, fill #0

## 2022-01-05 NOTE — Unmapped (Signed)
[facility] Refill Coordination Note    Specialty Medication(s) to be Shipped:   Transplant: Envarsus 1mg  and Myfortic 180mg     Other medication(s) to be shipped: metoprolol and magnesium     Louis Dennis, DOB: 02-14-1961  Phone: [phone] (home)       All above HIPAA information was verified with patient.     Was a Nurse, learning disability used for this call? No    Completed refill call assessment today to schedule patient's medication shipment from the [facility] [phone]).  All relevant notes have been reviewed.     Specialty medication(s) and dose(s) confirmed: Regimen is correct and unchanged.   Changes to medications: Louis Dennis reports no changes at this time.  Changes to insurance: No  New side effects reported not previously addressed with a pharmacist or physician: None reported  Questions for the pharmacist: No    Confirmed patient received a Conservation officer, historic buildings and a Surveyor, mining with first shipment. The patient will receive a drug information handout for each medication shipped and additional FDA Medication Guides as required.       DISEASE/MEDICATION-SPECIFIC INFORMATION        N/A    SPECIALTY MEDICATION ADHERENCE     Medication Adherence    Patient reported X missed doses in the last month: 0  Specialty Medication: ENVARSUS XR 1 mg Tb24 extended release tablet (tacrolimus)  Patient is on additional specialty medications: Yes  Additional Specialty Medications: MYFORTIC 180 MG EC tablet (mycophenolate)  Patient Reported Additional Medication X Missed Doses in the Last Month: 0  Patient is on more than two specialty medications: No        Were doses missed due to medication being on hold? No    Envarsus 1 mg: 4 days of medicine on hand   Myfortic 180 mg: 4 days of medicine on hand     REFERRAL TO PHARMACIST     Referral to the pharmacist: Not needed      [facility]     Shipping address confirmed in Epic.     Delivery Scheduled: Yes, Expected medication delivery date: 01/07/2022.     Medication will be delivered via Same Day Courier to the prescription address in Epic WAM.    Louis Dennis   [facility] Specialty Technician

## 2022-01-06 DIAGNOSIS — Z940 Kidney transplant status: Principal | ICD-10-CM

## 2022-01-07 ENCOUNTER — Ambulatory Visit: Admit: 2022-01-07 | Discharge: 2022-01-08 | Payer: MEDICARE

## 2022-01-07 LAB — LIPID PANEL
CHOLESTEROL/HDL RATIO SCREEN: 3.2 (ref 1.0–4.5)
CHOLESTEROL: 159 mg/dL (ref <=200)
HDL CHOLESTEROL: 49 mg/dL (ref 40–60)
LDL CHOLESTEROL CALCULATED: 96 mg/dL (ref 40–99)
NON-HDL CHOLESTEROL: 110 mg/dL (ref 70–130)
TRIGLYCERIDES: 70 mg/dL (ref 0–150)
VLDL CHOLESTEROL CAL: 14 mg/dL (ref 12–42)

## 2022-01-07 LAB — CBC W/ AUTO DIFF
BASOPHILS ABSOLUTE COUNT: 0 10*9/L (ref 0.0–0.1)
BASOPHILS RELATIVE PERCENT: 1 %
EOSINOPHILS ABSOLUTE COUNT: 0.3 10*9/L (ref 0.0–0.5)
EOSINOPHILS RELATIVE PERCENT: 7.6 %
HEMATOCRIT: 45 % (ref 39.0–48.0)
HEMOGLOBIN: 15 g/dL (ref 12.9–16.5)
LYMPHOCYTES ABSOLUTE COUNT: 0.6 10*9/L — ABNORMAL LOW (ref 1.1–3.6)
LYMPHOCYTES RELATIVE PERCENT: 13.8 %
MEAN CORPUSCULAR HEMOGLOBIN CONC: 33.3 g/dL (ref 32.0–36.0)
MEAN CORPUSCULAR HEMOGLOBIN: 30.3 pg (ref 25.9–32.4)
MEAN CORPUSCULAR VOLUME: 91.1 fL (ref 77.6–95.7)
MEAN PLATELET VOLUME: 8.8 fL (ref 6.8–10.7)
MONOCYTES ABSOLUTE COUNT: 0.3 10*9/L (ref 0.3–0.8)
MONOCYTES RELATIVE PERCENT: 7.6 %
NEUTROPHILS ABSOLUTE COUNT: 3 10*9/L (ref 1.8–7.8)
NEUTROPHILS RELATIVE PERCENT: 70 %
NUCLEATED RED BLOOD CELLS: 0 /100{WBCs} (ref <=4)
PLATELET COUNT: 173 10*9/L (ref 150–450)
RED BLOOD CELL COUNT: 4.94 10*12/L (ref 4.26–5.60)
RED CELL DISTRIBUTION WIDTH: 14.3 % (ref 12.2–15.2)
WBC ADJUSTED: 4.3 10*9/L (ref 3.6–11.2)

## 2022-01-07 LAB — BK VIRUS QUANTITATIVE PCR, BLOOD: BK BLOOD RESULT: NOT DETECTED

## 2022-01-07 LAB — BASIC METABOLIC PANEL
ANION GAP: 6 mmol/L (ref 5–14)
BLOOD UREA NITROGEN: 17 mg/dL (ref 9–23)
BUN / CREAT RATIO: 26
CALCIUM: 9.5 mg/dL (ref 8.7–10.4)
CHLORIDE: 105 mmol/L (ref 98–107)
CO2: 26.7 mmol/L (ref 20.0–31.0)
CREATININE: 0.66 mg/dL
EGFR CKD-EPI (2021) MALE: >90 mL/min/{1.73_m2} (ref >=60)
GLUCOSE RANDOM: 129 mg/dL (ref 70–179)
POTASSIUM: 4.2 mmol/L (ref 3.4–4.8)
SODIUM: 138 mmol/L (ref 135–145)

## 2022-01-07 LAB — TACROLIMUS LEVEL, TROUGH: TACROLIMUS, TROUGH: 6.8 ng/mL (ref 5.0–15.0)

## 2022-01-07 LAB — PHOSPHORUS: PHOSPHORUS: 2.8 mg/dL (ref 2.4–5.1)

## 2022-01-07 LAB — MAGNESIUM: MAGNESIUM: 1.9 mg/dL (ref 1.6–2.6)

## 2022-01-07 MED FILL — METOPROLOL SUCCINATE ER 25 MG TABLET,EXTENDED RELEASE 24 HR: ORAL | 30 days supply | Qty: 30 | Fill #10

## 2022-01-07 MED FILL — MG-PLUS-PROTEIN 133 MG TABLET: ORAL | 30 days supply | Qty: 30 | Fill #4

## 2022-01-07 NOTE — Unmapped (Signed)
Louis Dennis 's Myfortic shipment will be delayed as a result of no refills remain on the prescription.      I have reached out to the patient  at [phone] and communicated the delay. We will call the patient back to reschedule the delivery upon resolution. We have not confirmed the new delivery date.      All other meds sent as scheduled.

## 2022-01-08 LAB — CMV DNA, QUANTITATIVE, PCR: CMV VIRAL LD: NOT DETECTED

## 2022-01-11 DIAGNOSIS — Z940 Kidney transplant status: Principal | ICD-10-CM

## 2022-01-11 LAB — VITAMIN D 25 HYDROXY: VITAMIN D, TOTAL (25OH): 28 ng/mL (ref 20.0–80.0)

## 2022-01-11 MED ORDER — MYFORTIC 180 MG TABLET,DELAYED RELEASE
ORAL_TABLET | Freq: Two times a day (BID) | ORAL | 11 refills | 30.00000 days | Status: CP
Start: 2022-01-11 — End: 2023-01-11
  Filled 2022-01-11: qty 180, 30d supply, fill #0

## 2022-01-11 NOTE — Unmapped (Signed)
Shelva Majestic Guercio 's Myfortic shipment will be sent out  as a result of a new prescription for the medication has been received.      I have reached out to the patient  at [phone] and communicated the delivery change. We will reschedule the medication for the delivery date that the patient agreed upon.  We have confirmed the delivery date as 01/12/2022, via next day courier.

## 2022-01-13 LAB — HLA DS POST TRANSPLANT
ANTI-DONOR DRW #1 MFI: 55 MFI
ANTI-DONOR DRW #2 MFI: 21 MFI
ANTI-DONOR HLA-A #2 MFI: 0 MFI
ANTI-DONOR HLA-B #1 MFI: 0 MFI
ANTI-DONOR HLA-B #2 MFI: 0 MFI
ANTI-DONOR HLA-C #1 MFI: 0 MFI
ANTI-DONOR HLA-C #2 MFI: 0 MFI
ANTI-DONOR HLA-DQB #1 MFI: 5 MFI
ANTI-DONOR HLA-DQB #2 MFI: 123 MFI
ANTI-DONOR HLA-DR #1 MFI: 0 MFI
ANTI-DONOR HLA-DR #2 MFI: 0 MFI

## 2022-01-13 LAB — FSAB CLASS 2 ANTIBODY SPECIFICITY: HLA CL2 AB RESULT: NEGATIVE

## 2022-01-13 LAB — FSAB CLASS 1 ANTIBODY SPECIFICITY: HLA CLASS 1 ANTIBODY RESULT: NEGATIVE

## 2022-01-14 LAB — VITAMIN D 1,25 DIHYDROXY: VITAMIN D 1,25-DIHYDROXY: 43 pg/mL

## 2022-02-02 ENCOUNTER — Ambulatory Visit: Admit: 2022-02-02 | Discharge: 2022-02-03 | Payer: MEDICARE

## 2022-02-02 LAB — CBC W/ AUTO DIFF
BASOPHILS ABSOLUTE COUNT: 0.1 10*9/L (ref 0.0–0.1)
BASOPHILS RELATIVE PERCENT: 1.2 %
EOSINOPHILS ABSOLUTE COUNT: 0.4 10*9/L (ref 0.0–0.5)
EOSINOPHILS RELATIVE PERCENT: 8.3 %
HEMATOCRIT: 44.9 % (ref 39.0–48.0)
HEMOGLOBIN: 15 g/dL (ref 12.9–16.5)
LYMPHOCYTES ABSOLUTE COUNT: 0.6 10*9/L — ABNORMAL LOW (ref 1.1–3.6)
LYMPHOCYTES RELATIVE PERCENT: 13.3 %
MEAN CORPUSCULAR HEMOGLOBIN CONC: 33.3 g/dL (ref 32.0–36.0)
MEAN CORPUSCULAR HEMOGLOBIN: 30.2 pg (ref 25.9–32.4)
MEAN CORPUSCULAR VOLUME: 90.6 fL (ref 77.6–95.7)
MEAN PLATELET VOLUME: 9.1 fL (ref 6.8–10.7)
MONOCYTES ABSOLUTE COUNT: 0.3 10*9/L (ref 0.3–0.8)
MONOCYTES RELATIVE PERCENT: 7.5 %
NEUTROPHILS ABSOLUTE COUNT: 3.2 10*9/L (ref 1.8–7.8)
NEUTROPHILS RELATIVE PERCENT: 69.7 %
NUCLEATED RED BLOOD CELLS: 0 /100{WBCs} (ref <=4)
PLATELET COUNT: 177 10*9/L (ref 150–450)
RED BLOOD CELL COUNT: 4.96 10*12/L (ref 4.26–5.60)
RED CELL DISTRIBUTION WIDTH: 14.4 % (ref 12.2–15.2)
WBC ADJUSTED: 4.5 10*9/L (ref 3.6–11.2)

## 2022-02-02 LAB — BASIC METABOLIC PANEL
ANION GAP: 8 mmol/L (ref 5–14)
BLOOD UREA NITROGEN: 16 mg/dL (ref 9–23)
BUN / CREAT RATIO: 25
CALCIUM: 9.5 mg/dL (ref 8.7–10.4)
CHLORIDE: 106 mmol/L (ref 98–107)
CO2: 26.5 mmol/L (ref 20.0–31.0)
CREATININE: 0.63 mg/dL
EGFR CKD-EPI (2021) MALE: >90 mL/min/{1.73_m2} (ref >=60)
GLUCOSE RANDOM: 123 mg/dL (ref 70–179)
POTASSIUM: 4.5 mmol/L (ref 3.4–4.8)
SODIUM: 140 mmol/L (ref 135–145)

## 2022-02-02 LAB — TACROLIMUS LEVEL, TROUGH: TACROLIMUS, TROUGH: 7.7 ng/mL (ref 5.0–15.0)

## 2022-02-02 LAB — PHOSPHORUS: PHOSPHORUS: 3 mg/dL (ref 2.4–5.1)

## 2022-02-02 LAB — MAGNESIUM: MAGNESIUM: 1.8 mg/dL (ref 1.6–2.6)

## 2022-02-03 NOTE — Unmapped (Signed)
[facility] Clinical Assessment & Refill Coordination Note    Louis Dennis, [place]: June 18, 1961  Phone: [phone] (home)     All above HIPAA information was verified with patient.     Was a Nurse, learning disability used for this call? No    Specialty Medication(s):   Transplant: Envarsus 1mg  and Myfortic 180mg      Current Outpatient Medications   Medication Sig Dispense Refill   ??? magnesium oxide-Mg AA chelate (MAGNESIUM, AMINO ACID CHELATE,) 133 mg Take 1 tablet by mouth daily. 30 tablet 11   ??? metFORMIN (GLUCOPHAGE-XR) 500 MG 24 hr tablet TAKE 1 TABLET (500 MG TOTAL) BY MOUTH DAILY WITH EVENING MEAL. 90 tablet 3   ??? metoprolol succinate (TOPROL XL) 25 MG 24 hr tablet Take 1 tablet (25 mg total) by mouth nightly. 30 tablet 11   ??? MYFORTIC 180 mg EC tablet Take 3 tablets (540 mg total) by mouth Two (2) times a day. 180 tablet 11   ??? tacrolimus (ENVARSUS XR) 1 mg Tb24 extended release tablet Take 2 tablets (2 mg total) by mouth in the morning. 180 tablet 3     No current facility-administered medications for this visit.        Changes to medications: Maxime reports no changes at this time.    No Known Allergies    Changes to allergies: No    SPECIALTY MEDICATION ADHERENCE     Envarsus Xr 1 mg: 13 days of medicine on hand   Myfortic 180 mg: 13 days of medicine on hand       Medication Adherence    Patient reported X missed doses in the last month: 0  Specialty Medication: Myfortic 180mg   Patient is on additional specialty medications: Yes  Additional Specialty Medications: Envarsus Xr 1mg   Patient Reported Additional Medication X Missed Doses in the Last Month: 0  Patient is on more than two specialty medications: No          Specialty medication(s) dose(s) confirmed: Regimen is correct and unchanged.     Are there any concerns with adherence? No    Adherence counseling provided? Not needed    CLINICAL MANAGEMENT AND INTERVENTION      Clinical Benefit Assessment:    Do you feel the medicine is effective or helping your condition? Yes    Clinical Benefit counseling provided? Not needed    Adverse Effects Assessment:    Are you experiencing any side effects? No    Are you experiencing difficulty administering your medicine? No    Quality of Life Assessment:         How many days over the past month did your kidney transplant  keep you from your normal activities? For example, brushing your teeth or getting up in the morning. 0    Have you discussed this with your provider? Not needed    Acute Infection Status:    Acute infections noted within Epic:  No active infections  Patient reported infection: None    Therapy Appropriateness:    Is therapy appropriate and patient progressing towards therapeutic goals? Yes, therapy is appropriate and should be continued    DISEASE/MEDICATION-SPECIFIC INFORMATION      N/A    PATIENT SPECIFIC NEEDS     - Does the patient have any physical, cognitive, or cultural barriers? No    - Is the patient high risk? Yes, patient is taking a REMS drug. Medication is dispensed in compliance with REMS program    - Does  the patient require a Care Management Plan? No     SOCIAL DETERMINANTS OF HEALTH     At the [facility], we have learned that life circumstances - like trouble affording food, housing, utilities, or transportation can affect the health of many of our patients.   That is why we wanted to ask: are you currently experiencing any life circumstances that are negatively impacting your health and/or quality of life? Patient declined to answer    Social Determinants of Health     Food Insecurity: No Food Insecurity   ??? Worried About Programme researcher, broadcasting/film/video in the Last Year: Never true   ??? Ran Out of Food in the Last Year: Never true   Tobacco Use: Medium Risk   ??? Smoking Tobacco Use: Former   ??? Smokeless Tobacco Use: Never   ??? Passive Exposure: Not on file   Transportation Needs: No Transportation Needs   ??? Lack of Transportation (Medical): No   ??? Lack of Transportation (Non-Medical): No   Alcohol Use: Not At Risk   ??? How often do you have a drink containing alcohol?: Never   ??? How many drinks containing alcohol do you have on a typical day when you are drinking?: Not on file   ??? How often do you have 5 or more drinks on one occasion?: Never   Housing/Utilities: Low Risk    ??? Within the past 12 months, have you ever stayed: outside, in a car, in a tent, in an overnight shelter, or temporarily in someone else's home (i.e. couch-surfing)?: No   ??? Are you worried about losing your housing?: No   ??? Within the past 12 months, have you been unable to get utilities (heat, electricity) when it was really needed?: No   Substance Use: Low Risk    ??? Taken prescription drugs for non-medical reasons: Never   ??? Taken illegal drugs: Never   ??? Patient indicated they have taken drugs in the past year for non-medical reasons: Yes, [positive answer(s)]: Not on file   Financial Resource Strain: Low Risk    ??? Difficulty of Paying Living Expenses: Not hard at all   Physical Activity: Not on file   Health Literacy: High Risk   ??? : Often   Stress: Not on file   Intimate Partner Violence: Not on file   Depression: Not at risk   ??? PHQ-2 Score: 0   Social Connections: Not on file       Would you be willing to receive help with any of the needs that you have identified today? Not applicable       SHIPPING     Specialty Medication(s) to be Shipped:   Transplant: Envarsus 1mg  and Myfortic 180mg     Other medication(s) to be shipped: mg, metoprolol     Changes to insurance: No    Delivery Scheduled: Yes, Expected medication delivery date: 02/10/22.     Medication will be delivered via Next Day Courier to the confirmed prescription address in [facility].    The patient will receive a drug information handout for each medication shipped and additional FDA Medication Guides as required.  Verified that patient has previously received a Conservation officer, historic buildings and a Surveyor, mining.    The patient or caregiver noted above participated in the development of this care plan and knows that they can request review of or adjustments to the care plan at any time.      All of the patient's  questions and concerns have been addressed.    Tera Helper   [facility] Specialty Pharmacist

## 2022-02-09 MED FILL — ENVARSUS XR 1 MG TABLET,EXTENDED RELEASE: ORAL | 30 days supply | Qty: 60 | Fill #1

## 2022-02-09 MED FILL — METOPROLOL SUCCINATE ER 25 MG TABLET,EXTENDED RELEASE 24 HR: ORAL | 30 days supply | Qty: 30 | Fill #11

## 2022-02-09 MED FILL — MG-PLUS-PROTEIN 133 MG TABLET: ORAL | 30 days supply | Qty: 30 | Fill #5

## 2022-02-09 MED FILL — MYFORTIC 180 MG TABLET,DELAYED RELEASE: ORAL | 30 days supply | Qty: 180 | Fill #1

## 2022-02-20 MED ORDER — METFORMIN ER 500 MG TABLET,EXTENDED RELEASE 24 HR
ORAL_TABLET | 3 refills | 0 days
Start: 2022-02-20 — End: ?

## 2022-02-25 NOTE — Unmapped (Signed)
Assessment and Plan:     Problem List Items Addressed This Visit          Cardiovascular and Mediastinum    Hypertension     BP at goal in clinic today.   BP Readings from Last 1 Encounters:   03/08/22 100/64      Continue metoprolol succinate 25 mg.   Consider reducing metoprolol to lower dose. BP 90-100/60-70 (occasional DBP of 55 with dizziness). Goal to keep BP at or less than 120/80.   Metoprolol XL 25 mg (consider half dose if possible to half XL tablet).   Advise patient to discuss with nephrologist next week during visit.   Reviewed low sodium diet and encouraged regular exercise.   Advised to continue to monitor and log at-home BP readings.   Contact clinic if readings become elevated or too low.   Follow up with nephrologist on medication changes.              Other    Pre-diabetes - Primary     Lab Results   Component Value Date    A1C 5.8 03/08/2022    A1C 5.7 12/07/2021    A1C 5.7 08/30/2021     HGB A1c 5.8 (up from 5.7 three months ago).   Pre-diabetes mellitus well controlled. Continue current diet and regimen.  Encouraged patient to continue carb controlled diet and regular exercise.              Relevant Orders    POCT glycosylated hemoglobin (Hb A1C) (Completed)     I personally spent 30 minutes face-to-face and non-face-to-face in the care of this patient, which includes all pre, intra, and post visit time on the date of service.     Return in about 4 months (around 07/08/2022) for Next scheduled follow up.    HPI:        Louis Dennis  is here for   Chief Complaint   Patient presents with    Hypertension     Fasting.  BP running low so would like to reduce Metoprolol.  Discuss labs drawn by nephrologist.    Prediabetes     Stopped Metformin in Jan.     Patient was last seen by me 12/07/2021 for hypertension, pre-diabetes mellitus, vaccines, and pain in bilateral feet. He is here today for a follow up appointment. Louis Dennis says he would like to decrease his Metoprolol due to to low BP readings at home; at night BP measurements of 90/55. In the mornings his BP is 100/60. Goal for BP is at or <120/80. When he was on 10 mg his average BP was 130/90. States he does not have hypertension symptoms currently. If his diastolic pressure goes under 90 he gets occasionally dizzy.    He had labs drawn yesterday with his nephrologist and all were within normal limits except his bilirubin. Usually it is within 0.30-0.40 and yesterday it was 0.50.     Patient A1c is usually stable around 5.7/8. He eats fruit and vegetables, but more fruit; blueberry, strawberry, apple, and avocados. He eats well balanced overall.     ROS:   Comprehensive 10 point ROS negative unless otherwise stated in the HPI.      [facility] Components:   Medication adherence and barriers to the treatment plan have been addressed. Opportunities to optimize healthy behaviors have been discussed. Patient / caregiver voiced understanding.    Past Medical/Surgical History:     Past Medical History:   Diagnosis Date  Abnormal thyroid function test     End stage renal disease (CMS-HCC)     now on peritoneal dialysis    Gout     reports was in Right hand, foot and left elbow    Hypertension     takes meds intermitttently based on readings    Nephrolithiasis 2014    Visual impairment     glasses     Past Surgical History:   Procedure Laterality Date    OTHER SURGICAL HISTORY Right 08/2017    Pt had catheter moved from left side to right side.    pd catheter  2012    PR COLONOSCOPY W/BIOPSY SINGLE/MULTIPLE N/A 06/15/2018    Procedure: COLONOSCOPY, FLEXIBLE, PROXIMAL TO SPLENIC FLEXURE; WITH BIOPSY, SINGLE OR MULTIPLE;  Surgeon: Charm Rings, MD;  [facility];  Service: Gastroenterology    PR COLSC FLX W/RMVL OF TUMOR POLYP LESION SNARE TQ N/A 06/15/2018    Procedure: COLONOSCOPY FLEX; W/REMOV TUMOR/LES BY SNARE;  Surgeon: Charm Rings, MD;  Location: [facility];  Service: Gastroenterology    PR EXPLORATORY OF ABDOMEN N/A 11/22/2018    Procedure: EXPLORATORY LAPAROTOMY, EXPLORATORY CELIOTOMY WITH OR WITHOUT BIOPSY(S);  Surgeon: Lawrence Marseilles Day Thurnell Lose, MD;  Location: MAIN OR [facility];  Service: Trauma    PR EXPLORE PARATHYROID GLANDS N/A 11/11/2019    Procedure: RS 22 PARATHYROIDECTOMY OR EXPLORATION OF PARATHYROID(S);  Surgeon: Johny Chess, MD;  Location: MAIN OR [facility];  Service: Surgical Oncology    PR FREEING BOWEL ADHESION,ENTEROLYSIS N/A 11/22/2018    Procedure: Enterolysis (Separt Proc);  Surgeon: Lawrence Marseilles Day Thurnell Lose, MD;  Location: MAIN OR [facility];  Service: Trauma    PR REMOVE PERITONEAL FOREIGN BODY N/A 11/22/2018    Procedure: Removal Of Peritoneal Of Foreign Body From Peritoneal Cavity;  Surgeon: Lawrence Marseilles Day Thurnell Lose, MD;  Location: MAIN OR [facility];  Service: Trauma    PR TRANSPLANT,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    Procedure: [facility] STD PREP CAD DONR RENAL ALLOGFT PRIOR TO TRNSPLNT, INCL DISSEC/REM PERINEPH FAT, DIAPH/RTPER ATTAC;  Surgeon: Doyce Loose, MD;  Location: MAIN OR [place];  Service: Transplant    PR TRANSPLANTATION OF KIDNEY N/A 05/24/2019    Procedure: RENAL ALLOTRANSPLANTATION, IMPLANTATION OF GRAFT; WITHOUT RECIPIENT NEPHRECTOMY;  Surgeon: Doyce Loose, MD;  Location: MAIN OR [place];  Service: Transplant       Family History:     Family History   Problem Relation Age of Onset    Hypertension Mother     Kidney disease Mother     Diabetes Mother     Hypertension Father     Kidney disease Sister         s/p transplant    Hypertension Brother     Glaucoma Neg Hx     Amblyopia Neg Hx     Blindness Neg Hx     Retinal detachment Neg Hx     Strabismus Neg Hx     Macular degeneration Neg Hx     Basal cell carcinoma Neg Hx     Melanoma Neg Hx     Squamous cell carcinoma Neg Hx        Social History:     Social History     Tobacco Use    Smoking status: Former     Packs/day: 1.00     Years: 30.00     Pack years: 30.00     Types: Cigarettes     Quit date: 07/10/2009  Years since quitting: 12.6    Smokeless tobacco: Never   Vaping Use    Vaping Use: Never used   Substance Use Topics    Alcohol use: No     Alcohol/week: 0.0 standard drinks    Drug use: No       Allergies:   Patient has no known allergies.    Current Medications:     Current Outpatient Medications   Medication Sig Dispense Refill    magnesium oxide-Mg AA chelate (MAGNESIUM, AMINO ACID CHELATE,) 133 mg Take 1 tablet by mouth daily. 30 tablet 11    metoprolol succinate (TOPROL XL) 25 MG 24 hr tablet Take 1 tablet (25 mg total) by mouth nightly. 30 tablet 11    MYFORTIC 180 mg EC tablet Take 3 tablets (540 mg total) by mouth Two (2) times a day. 180 tablet 11    tacrolimus (ENVARSUS XR) 1 mg Tb24 extended release tablet Take 2 tablets (2 mg total) by mouth in the morning. 180 tablet 3    metFORMIN (GLUCOPHAGE-XR) 500 MG 24 hr tablet TAKE 1 TABLET (500 MG TOTAL) BY MOUTH DAILY WITH EVENING MEAL. (Patient not taking: Reported on 03/08/2022) 90 tablet 3     No current facility-administered medications for this visit.       Health Maintenance:     Health Maintenance   Topic Date Due    Meningococcal B Vaccines (1 of 4 - Increased Risk Bexsero 2-dose series) Never done    COVID-19 Vaccine (5 - Booster for Moderna series) 05/12/2021    Lipid Screening  01/07/2027    Colonoscopy  06/15/2028    DTaP/Tdap/Td Vaccines (2 - Td or Tdap) 12/30/2029    Pneumococcal Vaccine 0-64  Completed    Hepatitis C Screen  Completed    Influenza Vaccine  Completed    Zoster Vaccines  Discontinued       Immunizations:     Immunization History   Administered Date(s) Administered    COVID-19 VACCINE,MRNA(MODERNA)(PF) 04/12/2020, 05/10/2020, 08/15/2020, 03/17/2021    INFLUENZA INJ MDCK PF, QUAD,(FLUCELVAX)(86MO AND UP EGG FREE) 09/04/2017, 08/31/2020, 08/30/2021    INFLUENZA TIV (TRI) PF (IM) 08/17/2011, 08/08/2012, 08/05/2013, 08/28/2014, 08/11/2015, 08/29/2015    Influenza Vaccine Quad (IIV4 PF) 62mo+ injectable 08/16/2015, 09/06/2016, 09/11/2017, 08/06/2019    Influenza Virus Vaccine, unspecified formulation 08/30/2016, 09/18/2017, 09/04/2018, 10/02/2018    PNEUMOCOCCAL POLYSACCHARIDE 23 07/06/2015    PPD Test 09/08/2011, 09/07/2012, 09/05/2013, 09/09/2014, 09/07/2015, 08/10/2016, 09/11/2017, 09/05/2018, 01/03/2019    Pneumococcal Conjugate 13-Valent 07/24/2010, 07/15/2015    Pneumococcal Conjugate 20-valent 08/30/2021    TdaP 12/31/2019     I have reviewed and (if needed) updated the patient's problem list, medications, allergies, past medical and surgical history, social and family history.     Vital Signs:     Wt Readings from Last 3 Encounters:   03/08/22 65.8 kg (145 lb)   12/09/21 66.2 kg (146 lb)   12/07/21 65.8 kg (145 lb)     Temp Readings from Last 3 Encounters:   03/08/22 36.6 ??C (97.9 ??F) (Oral)   12/09/21 36.3 ??C (97.3 ??F) (Temporal)   12/07/21 36.7 ??C (98 ??F) (Oral)     BP Readings from Last 3 Encounters:   03/08/22 100/64   12/09/21 106/63   12/07/21 102/62     Pulse Readings from Last 3 Encounters:   03/08/22 68   12/09/21 89   12/07/21 79     Estimated body mass index is 25.69 kg/m?? as calculated from the following:  Height as of this encounter: 160 cm (5' 2.99).    Weight as of this encounter: 65.8 kg (145 lb).  Facility age limit for growth percentiles is 20 years.        Objective:      General: Alert and oriented x3. Well-appearing. No acute distress.   HEENT:  Normocephalic.  Atraumatic. Conjunctiva and sclera normal. OP MMM without lesions.   Neck:  Supple. No thyroid enlargement. No adenopathy.   Heart:  Regular rate and rhythm. Normal S1, S2. No murmurs, rubs or gallops.   Lungs:  No respiratory distress.  Lungs clear to auscultation. No wheezes, rhonchi, or rales.   GI/GU:  Soft, +BS, nondistended, non-TTP. No palpable masses or organomegaly.   Extremities:  No edema. Peripheral pulses normal.   Skin:  Warm, dry. No rash or lesions present.   Neuro:  Non-focal. No obvious weakness.   Psych:  Affect normal, eye contact good, speech clear and coherent.          I attest that I, Vergia Alberts, personally documented this note while acting as scribe for Noralyn Pick, FNP.      Vergia Alberts, Scribe.  03/08/2022     The documentation recorded by the scribe accurately reflects the service I personally performed and the decisions made by me.    Noralyn Pick, FNP

## 2022-03-07 ENCOUNTER — Ambulatory Visit: Admit: 2022-03-07 | Discharge: 2022-03-08 | Payer: MEDICARE

## 2022-03-07 LAB — MAGNESIUM: MAGNESIUM: 1.9 mg/dL (ref 1.6–2.6)

## 2022-03-07 LAB — CBC W/ AUTO DIFF
BASOPHILS ABSOLUTE COUNT: 0 10*9/L (ref 0.0–0.1)
BASOPHILS RELATIVE PERCENT: 0.9 %
EOSINOPHILS ABSOLUTE COUNT: 0.3 10*9/L (ref 0.0–0.5)
EOSINOPHILS RELATIVE PERCENT: 8.7 %
HEMATOCRIT: 43.2 % (ref 39.0–48.0)
HEMOGLOBIN: 14.9 g/dL (ref 12.9–16.5)
LYMPHOCYTES ABSOLUTE COUNT: 0.6 10*9/L — ABNORMAL LOW (ref 1.1–3.6)
LYMPHOCYTES RELATIVE PERCENT: 14.6 %
MEAN CORPUSCULAR HEMOGLOBIN CONC: 34.4 g/dL (ref 32.0–36.0)
MEAN CORPUSCULAR HEMOGLOBIN: 31.4 pg (ref 25.9–32.4)
MEAN CORPUSCULAR VOLUME: 91.3 fL (ref 77.6–95.7)
MEAN PLATELET VOLUME: 9.1 fL (ref 6.8–10.7)
MONOCYTES ABSOLUTE COUNT: 0.4 10*9/L (ref 0.3–0.8)
MONOCYTES RELATIVE PERCENT: 9.1 %
NEUTROPHILS ABSOLUTE COUNT: 2.7 10*9/L (ref 1.8–7.8)
NEUTROPHILS RELATIVE PERCENT: 66.7 %
NUCLEATED RED BLOOD CELLS: 0 /100{WBCs} (ref <=4)
PLATELET COUNT: 153 10*9/L (ref 150–450)
RED BLOOD CELL COUNT: 4.73 10*12/L (ref 4.26–5.60)
RED CELL DISTRIBUTION WIDTH: 14.5 % (ref 12.2–15.2)
WBC ADJUSTED: 4 10*9/L (ref 3.6–11.2)

## 2022-03-07 LAB — COMPREHENSIVE METABOLIC PANEL
ALBUMIN: 4.5 g/dL (ref 3.4–5.0)
ALKALINE PHOSPHATASE: 66 U/L (ref 46–116)
ALT (SGPT): 13 U/L (ref 10–49)
ANION GAP: 8 mmol/L (ref 5–14)
AST (SGOT): 16 U/L (ref <=34)
BILIRUBIN TOTAL: 1.5 mg/dL — ABNORMAL HIGH (ref 0.3–1.2)
BLOOD UREA NITROGEN: 14 mg/dL (ref 9–23)
BUN / CREAT RATIO: 21
CALCIUM: 9.6 mg/dL (ref 8.7–10.4)
CHLORIDE: 105 mmol/L (ref 98–107)
CO2: 27.5 mmol/L (ref 20.0–31.0)
CREATININE: 0.67 mg/dL
EGFR CKD-EPI (2021) MALE: >90 mL/min/{1.73_m2} (ref >=60)
GLUCOSE RANDOM: 129 mg/dL (ref 70–179)
POTASSIUM: 4.3 mmol/L (ref 3.4–4.8)
PROTEIN TOTAL: 7.1 g/dL (ref 5.7–8.2)
SODIUM: 140 mmol/L (ref 135–145)

## 2022-03-07 LAB — PHOSPHORUS: PHOSPHORUS: 2.8 mg/dL (ref 2.4–5.1)

## 2022-03-07 LAB — TACROLIMUS LEVEL, TROUGH: TACROLIMUS, TROUGH: 6.7 ng/mL (ref 5.0–15.0)

## 2022-03-07 LAB — BILIRUBIN, DIRECT: BILIRUBIN DIRECT: 0.5 mg/dL — ABNORMAL HIGH (ref 0.00–0.30)

## 2022-03-08 ENCOUNTER — Ambulatory Visit: Admit: 2022-03-08 | Discharge: 2022-03-09 | Payer: MEDICARE | Attending: Family | Primary: Family

## 2022-03-08 DIAGNOSIS — I150 Renovascular hypertension: Principal | ICD-10-CM

## 2022-03-08 DIAGNOSIS — R7303 Prediabetes: Principal | ICD-10-CM

## 2022-03-08 NOTE — Unmapped (Signed)
BP near goal    BP Readings from Last 1 Encounters:   03/08/22 100/64    in clinic today.  Continue current regimen and medications.   Reviewed low sodium diet and encouraged regular exercise.   Advised to continue to monitor and log at-home BP readings.   Contact clinic if readings become elevated or too low.   Follow up with nephrologist on medication changes.

## 2022-03-08 NOTE — Unmapped (Addendum)
Consider reducing metoprolol to lower dose. BP 90-100/60-70 (occasional DBP of 55 with dizziness). Goal to keep BP at or less than 120/80.   PCP okay with reducing dosage.   Metoprolol XL 25 mg (consider half dose if possible to half XL tablet).       Care Gaps recommendation:  Meningococcal B vaccine  Any contraindications for patient? Check with nephrology.

## 2022-03-08 NOTE — Unmapped (Signed)
Lab Results   Component Value Date    A1C 5.8 03/08/2022    A1C 5.7 12/07/2021    A1C 5.7 08/30/2021       HGB A1c 5.8 (up from 5.7 three months ago).   Pre diabetes mellitus well controlled. Continue current diet and regimen.  Encouraged patient to continue carb controlled diet and regular exercise.

## 2022-03-11 MED ORDER — METOPROLOL SUCCINATE ER 25 MG TABLET,EXTENDED RELEASE 24 HR
ORAL_TABLET | Freq: Every evening | ORAL | 11 refills | 30 days | Status: CP
Start: 2022-03-11 — End: 2023-03-11
  Filled 2022-03-15: qty 30, 30d supply, fill #0

## 2022-03-11 NOTE — Unmapped (Signed)
[facility] Refill Coordination Note    Specialty Medication(s) to be Shipped:   Transplant: Envarsus 1mg  and Myfortic 180mg     Other medication(s) to be shipped: magnesium and metoprolol     Louis Dennis, DOB: Dec 27, 1960  Phone: [phone] (home)       All above HIPAA information was verified with patient.     Was a Nurse, learning disability used for this call? No    Completed refill call assessment today to schedule patient's medication shipment from the [facility] [phone]).  All relevant notes have been reviewed.     Specialty medication(s) and dose(s) confirmed: Regimen is correct and unchanged.   Changes to medications: Louis Dennis reports no changes at this time.  Changes to insurance: No  New side effects reported not previously addressed with a pharmacist or physician: None reported  Questions for the pharmacist: No    Confirmed patient received a Conservation officer, historic buildings and a Surveyor, mining with first shipment. The patient will receive a drug information handout for each medication shipped and additional FDA Medication Guides as required.       DISEASE/MEDICATION-SPECIFIC INFORMATION        N/A    SPECIALTY MEDICATION ADHERENCE     Medication Adherence    Patient reported X missed doses in the last month: 0  Specialty Medication: Envarsus 1mg   Patient is on additional specialty medications: Yes  Additional Specialty Medications: Myfortic 180mg   Patient Reported Additional Medication X Missed Doses in the Last Month: 0  Patient is on more than two specialty medications: No        Were doses missed due to medication being on hold? No    Envarsus 1 mg: 9 days of medicine on hand   Myfortic 180 mg: 9 days of medicine on hand     REFERRAL TO PHARMACIST     Referral to the pharmacist: Not needed      [facility]     Shipping address confirmed in Epic.     Delivery Scheduled: Yes, Expected medication delivery date: 03/16/2022.     Medication will be delivered via Next Day Courier to the prescription address in Epic WAM.    [name]   [facility] Specialty Technician

## 2022-03-15 MED FILL — MG-PLUS-PROTEIN 133 MG TABLET: ORAL | 30 days supply | Qty: 30 | Fill #6

## 2022-03-15 MED FILL — MYFORTIC 180 MG TABLET,DELAYED RELEASE: ORAL | 30 days supply | Qty: 180 | Fill #2

## 2022-03-15 MED FILL — ENVARSUS XR 1 MG TABLET,EXTENDED RELEASE: ORAL | 30 days supply | Qty: 60 | Fill #2

## 2022-03-25 NOTE — Unmapped (Signed)
Transplant Nephrology Clinic Visit    History of Present Illness  [name] is a 61 y.o. 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  -Adequate perfusion of the transplant kidney. Resistive indices within the mid main renal artery and at the anastomosis are slightly elevated. Attention on follow-up.     Current Immunosuppression Regimen:   - Myfortic 540mg  BID  - Envarsus 2 mg daily    Subjective/Interval:   Since patient's last visit in the transplant clinic - patient has been doing well in terms of transplant, taking transplant medications regularly, no episodes of rejection and no side effects of medication.    Today the patient presents feeling overall well. He saw his PCP earlier this month and reduced his Toprol-XL to 10 mg due low blood pressure.     Review of Systems  Otherwise as per HPI, all other systems reviewed and are negative.    Medications  Current Outpatient Medications   Medication Sig Dispense Refill    magnesium oxide-Mg AA chelate (MAGNESIUM, AMINO ACID CHELATE,) 133 mg Take 1 tablet by mouth daily. 30 tablet 11    metoprolol succinate (TOPROL XL) 25 MG 24 hr tablet Take 1 tablet (25 mg total) by mouth nightly. 30 tablet 11    MYFORTIC 180 mg EC tablet Take 3 tablets (540 mg total) by mouth Two (2) times a day. 180 tablet 11    tacrolimus (ENVARSUS XR) 1 mg Tb24 extended release tablet Take 2 tablets (2 mg total) by mouth in the morning. 180 tablet 3     No current facility-administered medications for this visit.     Physical Exam  Temp 36.3 ??C (97.3 ??F) (Temporal)  - Ht 160 cm (5' 2.99)  - Wt 66 kg (145 lb 9.6 oz)  - BMI 25.80 kg/m??     Nursing note and vitals reviewed.   Constitutional: Oriented to person, place, and time. Appears well-developed and well-nourished. No distress.   HENT: wearing mask  Head: Normocephalic and atraumatic.   Eyes: Right eye exhibits no discharge. Left eye exhibits no discharge. No scleral icterus.   Neck: Normal range of motion. Neck supple.   Cardiovascular: Normal rate and regular rhythm. Exam reveals no gallop and no friction rub. No murmur heard.   Pulmonary/Chest: Effort normal and breath sounds normal. No respiratory distress.   Abdominal: Soft. Non tender  Musculoskeletal: Normal range of motion. No edema and no tenderness.   Neurological: Alert and oriented to person, place, and time.       Laboratory Data and Imaging reviewed in EPIC    Assessment: [name] is a 61 y.o. male with a past  medically history significant for ESRD presumed due to hypertension but renal biopsy inconclusive now s/p DDKT on 05/24/19.       Recommendations/Plan:   Allograft Function: renal function stable at 0.71 with a baseline of approximately 0.7-0.9 since transplant. Will continue to monitor.  UPC is undetectable.    Immunosuppression Management [High Risk Medical Decision Making For Drug  Therapy Requiring Intensive Monitoring For Toxicity]:  Continue Envarsus 2 mg daily. Targeting tacrolimus trough level of approximately 5-7.  Continue Myfortic 540 mg BID. Tacrolimus trough 6.7(12/08/21).    Blood Pressure Management: BP 112/66 at this visit. Reduce Toprol-XL to 12.5 mg at night.    Pre-diabetes: . Home BG overall controlled. Last A1c was 5.7 on 06/02/21. Discontinued metformin on 12/07/21 by family medicine.     Hypercalcemia: Last Ca WNL  - s/p parathyroid surgery - 11/11/2019    Gout: no flare or activity since ~2016. He will stop allopurinol.    Lower Back Pain with midline radiculopathy: Known underlying degenerative changes. Resolved per patient.    Left Knee & Right Shoulder Pain: (06/12/20)  - Patient following with PT every 3 weeks for shoulder pain. Notes improved pain and ROM.    Health Maintenance:   - Colonoscopy due 2029  - Blood work once a month    Immunizations:  - Flu shot: 08/30/21  - Prevnar-20: 08/30/21.  - COVID vaccine (Moderna): 04/12/20, 08/15/20, 09/06/20, 03/17/21. Evusheld in clinic (06/04/21).    Follow-Up: Return to clinic in 4 months.    Scribe's Attestation: Leeroy Bock, MD obtained and performed the history, physical exam and medical decision making elements that were entered into the chart.  Signed by Allie Dimmer, Scribe, on April 01, 2022 at 2:17 PM.    Documentation assistance provided by the Scribe. I was present during the time the encounter was recorded. The information recorded by the Scribe was done at my direction and has been reviewed and validated by me.

## 2022-03-31 ENCOUNTER — Ambulatory Visit: Admit: 2022-03-31 | Discharge: 2022-04-01 | Payer: MEDICARE

## 2022-03-31 DIAGNOSIS — R7989 Other specified abnormal findings of blood chemistry: Principal | ICD-10-CM

## 2022-03-31 DIAGNOSIS — Z940 Kidney transplant status: Principal | ICD-10-CM

## 2022-03-31 DIAGNOSIS — R82998 Other abnormal findings in urine: Principal | ICD-10-CM

## 2022-03-31 LAB — CBC W/ AUTO DIFF
BASOPHILS ABSOLUTE COUNT: 0 10*9/L (ref 0.0–0.1)
BASOPHILS RELATIVE PERCENT: 0.9 %
EOSINOPHILS ABSOLUTE COUNT: 0.3 10*9/L (ref 0.0–0.5)
EOSINOPHILS RELATIVE PERCENT: 6.7 %
HEMATOCRIT: 44.1 % (ref 39.0–48.0)
HEMOGLOBIN: 14.6 g/dL (ref 12.9–16.5)
LYMPHOCYTES ABSOLUTE COUNT: 0.5 10*9/L — ABNORMAL LOW (ref 1.1–3.6)
LYMPHOCYTES RELATIVE PERCENT: 11.3 %
MEAN CORPUSCULAR HEMOGLOBIN CONC: 33.1 g/dL (ref 32.0–36.0)
MEAN CORPUSCULAR HEMOGLOBIN: 30.4 pg (ref 25.9–32.4)
MEAN CORPUSCULAR VOLUME: 92.1 fL (ref 77.6–95.7)
MEAN PLATELET VOLUME: 9.1 fL (ref 6.8–10.7)
MONOCYTES ABSOLUTE COUNT: 0.4 10*9/L (ref 0.3–0.8)
MONOCYTES RELATIVE PERCENT: 9.6 %
NEUTROPHILS ABSOLUTE COUNT: 3.3 10*9/L (ref 1.8–7.8)
NEUTROPHILS RELATIVE PERCENT: 71.5 %
NUCLEATED RED BLOOD CELLS: 0 /100{WBCs} (ref <=4)
PLATELET COUNT: 153 10*9/L (ref 150–450)
RED BLOOD CELL COUNT: 4.79 10*12/L (ref 4.26–5.60)
RED CELL DISTRIBUTION WIDTH: 14.9 % (ref 12.2–15.2)
WBC ADJUSTED: 4.6 10*9/L (ref 3.6–11.2)

## 2022-03-31 LAB — BASIC METABOLIC PANEL
ANION GAP: 7 mmol/L (ref 5–14)
BLOOD UREA NITROGEN: 11 mg/dL (ref 9–23)
BUN / CREAT RATIO: 15
CALCIUM: 9.9 mg/dL (ref 8.7–10.4)
CHLORIDE: 108 mmol/L — ABNORMAL HIGH (ref 98–107)
CO2: 26.1 mmol/L (ref 20.0–31.0)
CREATININE: 0.71 mg/dL
EGFR CKD-EPI (2021) MALE: >90 mL/min/{1.73_m2} (ref >=60)
GLUCOSE RANDOM: 121 mg/dL (ref 70–179)
POTASSIUM: 4.5 mmol/L (ref 3.4–4.8)
SODIUM: 141 mmol/L (ref 135–145)

## 2022-03-31 LAB — TACROLIMUS LEVEL, TROUGH: TACROLIMUS, TROUGH: 5.1 ng/mL (ref 5.0–15.0)

## 2022-03-31 LAB — PHOSPHORUS: PHOSPHORUS: 2.9 mg/dL (ref 2.4–5.1)

## 2022-03-31 LAB — MAGNESIUM: MAGNESIUM: 2.1 mg/dL (ref 1.6–2.6)

## 2022-04-01 ENCOUNTER — Ambulatory Visit: Admit: 2022-04-01 | Discharge: 2022-04-02 | Payer: MEDICARE | Attending: Nephrology | Primary: Nephrology

## 2022-04-01 DIAGNOSIS — Z940 Kidney transplant status: Principal | ICD-10-CM

## 2022-04-01 LAB — URINALYSIS WITH MICROSCOPY
BACTERIA: NONE SEEN /HPF
BILIRUBIN UA: NEGATIVE
BLOOD UA: NEGATIVE
GLUCOSE UA: NEGATIVE
KETONES UA: NEGATIVE
LEUKOCYTE ESTERASE UA: NEGATIVE
NITRITE UA: NEGATIVE
PH UA: 7 (ref 5.0–9.0)
PROTEIN UA: NEGATIVE
RBC UA: <1 /HPF (ref <3)
SPECIFIC GRAVITY UA: 1.015 (ref 1.005–1.030)
SQUAMOUS EPITHELIAL: <1 /HPF (ref 0–5)
UROBILINOGEN UA: 0.2
WBC UA: <1 /HPF (ref <2)

## 2022-04-01 LAB — PROTEIN / CREATININE RATIO, URINE
CREATININE, URINE: 83.3 mg/dL
PROTEIN URINE: 14.6 mg/dL
PROTEIN/CREAT RATIO, URINE: 0.175

## 2022-04-01 LAB — BILIRUBIN, DIRECT: BILIRUBIN DIRECT: 0.3 mg/dL (ref 0.00–0.30)

## 2022-04-01 LAB — BILIRUBIN, TOTAL: BILIRUBIN TOTAL: 0.9 mg/dL (ref 0.3–1.2)

## 2022-04-07 NOTE — Unmapped (Signed)
[facility] Refill Coordination Note    Specialty Medication(s) to be Shipped:   Transplant: Envarsus 1mg  and Myfortic 180mg     Other medication(s) to be shipped:  magnesium and metoprolol     Louis Dennis, DOB: 1961-08-24  Phone: [phone] (home)       All above HIPAA information was verified with patient.     Was a Nurse, learning disability used for this call? No    Completed refill call assessment today to schedule patient's medication shipment from the [facility] [phone]).  All relevant notes have been reviewed.     Specialty medication(s) and dose(s) confirmed: Regimen is correct and unchanged.   Changes to medications: Louis Dennis reports no changes at this time.  Changes to insurance: No  New side effects reported not previously addressed with a pharmacist or physician: None reported  Questions for the pharmacist: No    Confirmed patient received a Conservation officer, historic buildings and a Surveyor, mining with first shipment. The patient will receive a drug information handout for each medication shipped and additional FDA Medication Guides as required.       DISEASE/MEDICATION-SPECIFIC INFORMATION        N/A    SPECIALTY MEDICATION ADHERENCE     Medication Adherence    Patient reported X missed doses in the last month: 0  Specialty Medication: ENVARSUS XR 1 mg Tb24 extended release tablet (tacrolimus)  Patient is on additional specialty medications: Yes  Additional Specialty Medications: MYFORTIC 180 MG EC tablet (mycophenolate)  Patient Reported Additional Medication X Missed Doses in the Last Month: 0        Were doses missed due to medication being on hold? No    Envarsus 1 mg: 9 days of medicine on hand   Myfortic 180 mg: 9 days of medicine on hand     REFERRAL TO PHARMACIST     Referral to the pharmacist: Not needed      [facility]     Shipping address confirmed in Epic.     Delivery Scheduled: Yes, Expected medication delivery date: 04/12/22.     Medication will be delivered via Next Day Courier to the prescription address in Epic WAM.    Louis Dennis Nurse   [facility] Specialty Technician

## 2022-04-11 MED FILL — ENVARSUS XR 1 MG TABLET,EXTENDED RELEASE: ORAL | 30 days supply | Qty: 60 | Fill #3

## 2022-04-11 MED FILL — MG-PLUS-PROTEIN 133 MG TABLET: ORAL | 30 days supply | Qty: 30 | Fill #7

## 2022-04-11 MED FILL — MYFORTIC 180 MG TABLET,DELAYED RELEASE: ORAL | 30 days supply | Qty: 180 | Fill #3

## 2022-04-11 MED FILL — METOPROLOL SUCCINATE ER 25 MG TABLET,EXTENDED RELEASE 24 HR: ORAL | 30 days supply | Qty: 30 | Fill #1

## 2022-04-26 ENCOUNTER — Telehealth: Payer: Self-pay | Admitting: Cardiovascular Disease

## 2022-04-26 NOTE — Telephone Encounter (Signed)
3 attempts to schedule fu appt from recall list.   Deleting recall.   

## 2022-05-06 ENCOUNTER — Ambulatory Visit: Admit: 2022-05-06 | Discharge: 2022-05-07 | Payer: MEDICARE

## 2022-05-06 LAB — BASIC METABOLIC PANEL
ANION GAP: 8 mmol/L (ref 5–14)
BLOOD UREA NITROGEN: 15 mg/dL (ref 9–23)
BUN / CREAT RATIO: 23
CALCIUM: 9.6 mg/dL (ref 8.7–10.4)
CHLORIDE: 108 mmol/L — ABNORMAL HIGH (ref 98–107)
CO2: 25.1 mmol/L (ref 20.0–31.0)
CREATININE: 0.65 mg/dL
EGFR CKD-EPI (2021) MALE: >90 mL/min/{1.73_m2} (ref >=60)
GLUCOSE RANDOM: 111 mg/dL (ref 70–179)
POTASSIUM: 4.2 mmol/L (ref 3.4–4.8)
SODIUM: 141 mmol/L (ref 135–145)

## 2022-05-06 LAB — CBC W/ AUTO DIFF
BASOPHILS ABSOLUTE COUNT: 0 10*9/L (ref 0.0–0.1)
BASOPHILS RELATIVE PERCENT: 1.1 %
EOSINOPHILS ABSOLUTE COUNT: 0.3 10*9/L (ref 0.0–0.5)
EOSINOPHILS RELATIVE PERCENT: 7.7 %
HEMATOCRIT: 42.2 % (ref 39.0–48.0)
HEMOGLOBIN: 14.2 g/dL (ref 12.9–16.5)
LYMPHOCYTES ABSOLUTE COUNT: 0.5 10*9/L — ABNORMAL LOW (ref 1.1–3.6)
LYMPHOCYTES RELATIVE PERCENT: 13.6 %
MEAN CORPUSCULAR HEMOGLOBIN CONC: 33.6 g/dL (ref 32.0–36.0)
MEAN CORPUSCULAR HEMOGLOBIN: 31.2 pg (ref 25.9–32.4)
MEAN CORPUSCULAR VOLUME: 92.8 fL (ref 77.6–95.7)
MEAN PLATELET VOLUME: 9.2 fL (ref 6.8–10.7)
MONOCYTES ABSOLUTE COUNT: 0.4 10*9/L (ref 0.3–0.8)
MONOCYTES RELATIVE PERCENT: 9.1 %
NEUTROPHILS ABSOLUTE COUNT: 2.8 10*9/L (ref 1.8–7.8)
NEUTROPHILS RELATIVE PERCENT: 68.5 %
NUCLEATED RED BLOOD CELLS: 0 /100{WBCs} (ref <=4)
PLATELET COUNT: 157 10*9/L (ref 150–450)
RED BLOOD CELL COUNT: 4.55 10*12/L (ref 4.26–5.60)
RED CELL DISTRIBUTION WIDTH: 15 % (ref 12.2–15.2)
WBC ADJUSTED: 4 10*9/L (ref 3.6–11.2)

## 2022-05-06 LAB — TACROLIMUS LEVEL, TROUGH: TACROLIMUS, TROUGH: 4.2 ng/mL — ABNORMAL LOW (ref 5.0–15.0)

## 2022-05-06 LAB — MAGNESIUM: MAGNESIUM: 1.8 mg/dL (ref 1.6–2.6)

## 2022-05-06 LAB — PHOSPHORUS: PHOSPHORUS: 3.1 mg/dL (ref 2.4–5.1)

## 2022-05-10 NOTE — Unmapped (Signed)
[facility] Refill Coordination Note    Specialty Medication(s) to be Shipped:   Transplant: Envarsus 1mg  and Myfortic 180mg     Other medication(s) to be shipped:  magnesium and metoprolol     Louis Dennis, DOB: 03-18-1961  Phone: [phone] (home)       All above HIPAA information was verified with patient.     Was a Nurse, learning disability used for this call? No    Completed refill call assessment today to schedule patient's medication shipment from the [facility] [phone]).  All relevant notes have been reviewed.     Specialty medication(s) and dose(s) confirmed: Regimen is correct and unchanged.   Changes to medications: Louis Dennis reports no changes at this time.  Changes to insurance: No  New side effects reported not previously addressed with a pharmacist or physician: None reported  Questions for the pharmacist: No    Confirmed patient received a Conservation officer, historic buildings and a Surveyor, mining with first shipment. The patient will receive a drug information handout for each medication shipped and additional FDA Medication Guides as required.       DISEASE/MEDICATION-SPECIFIC INFORMATION        N/A    SPECIALTY MEDICATION ADHERENCE     Medication Adherence    Patient reported X missed doses in the last month: 0  Specialty Medication: tacrolimus (ENVARSUS XR) 1 mg Tb24 extended release tablet  Patient is on additional specialty medications: Yes  Additional Specialty Medications: Myfortic 180mg   Patient Reported Additional Medication X Missed Doses in the Last Month: 0  Patient is on more than two specialty medications: No        Were doses missed due to medication being on hold? No    Envarsus 1 mg: 4 days of medicine on hand   Myfortic 180 mg: 4 days of medicine on hand     REFERRAL TO PHARMACIST     Referral to the pharmacist: Not needed      [facility]     Shipping address confirmed in Epic.     Delivery Scheduled: Yes, Expected medication delivery date: 05/13/2022.     Medication will be delivered via Next Day Courier to the prescription address in Epic WAM.    [name]   [facility] Specialty Technician

## 2022-05-12 MED FILL — METOPROLOL SUCCINATE ER 25 MG TABLET,EXTENDED RELEASE 24 HR: ORAL | 30 days supply | Qty: 30 | Fill #2

## 2022-05-12 MED FILL — ENVARSUS XR 1 MG TABLET,EXTENDED RELEASE: ORAL | 30 days supply | Qty: 60 | Fill #4

## 2022-05-12 MED FILL — MG-PLUS-PROTEIN 133 MG TABLET: ORAL | 30 days supply | Qty: 30 | Fill #8

## 2022-05-12 MED FILL — MYFORTIC 180 MG TABLET,DELAYED RELEASE: ORAL | 30 days supply | Qty: 180 | Fill #4

## 2022-05-16 DIAGNOSIS — D849 Immunodeficiency, unspecified: Principal | ICD-10-CM

## 2022-05-16 DIAGNOSIS — Z940 Kidney transplant status: Principal | ICD-10-CM

## 2022-05-16 MED ORDER — TACROLIMUS XR 1 MG TABLET,EXTENDED RELEASE 24 HR
ORAL_TABLET | Freq: Every day | ORAL | 3 refills | 90 days | Status: CP
Start: 2022-05-16 — End: 2023-05-16
  Filled 2022-06-13: qty 90, 30d supply, fill #0

## 2022-05-16 NOTE — Unmapped (Addendum)
[facility] Pharmacist has reviewed this new prescription.  Patient was counseled on this dosage change by coordinator AS- see epic note from 6/12.  Next refill call date adjusted if necessary.      Clinical Assessment Needed For: Dose Change  Medication: ENVARSUS XR 1 mg Tb24 extended release tablet (tacrolimus)  Last Fill Date/Day Supply: 05/12/2022 / 30  Copay $0  Was previous dose already scheduled to fill: No    Notes to Pharmacist: n/a

## 2022-05-16 NOTE — Unmapped (Signed)
Called and spoke with patient regarding Tac trough of 4.2.  Per Dr Christena Deem, patient to increase Envarsus dose to 3 mg daily.  Patient verbalized understanding.  New script sent to [facility].

## 2022-05-27 ENCOUNTER — Ambulatory Visit: Admit: 2022-05-27 | Discharge: 2022-05-28 | Payer: MEDICARE

## 2022-05-27 LAB — CBC W/ AUTO DIFF
BASOPHILS ABSOLUTE COUNT: 0 10*9/L (ref 0.0–0.1)
BASOPHILS RELATIVE PERCENT: 0.6 %
EOSINOPHILS ABSOLUTE COUNT: 0.3 10*9/L (ref 0.0–0.5)
EOSINOPHILS RELATIVE PERCENT: 6.8 %
HEMATOCRIT: 45 % (ref 39.0–48.0)
HEMOGLOBIN: 14.7 g/dL (ref 12.9–16.5)
LYMPHOCYTES ABSOLUTE COUNT: 0.6 10*9/L — ABNORMAL LOW (ref 1.1–3.6)
LYMPHOCYTES RELATIVE PERCENT: 14.3 %
MEAN CORPUSCULAR HEMOGLOBIN CONC: 32.7 g/dL (ref 32.0–36.0)
MEAN CORPUSCULAR HEMOGLOBIN: 30.6 pg (ref 25.9–32.4)
MEAN CORPUSCULAR VOLUME: 93.6 fL (ref 77.6–95.7)
MEAN PLATELET VOLUME: 9.1 fL (ref 6.8–10.7)
MONOCYTES ABSOLUTE COUNT: 0.3 10*9/L (ref 0.3–0.8)
MONOCYTES RELATIVE PERCENT: 7.7 %
NEUTROPHILS ABSOLUTE COUNT: 2.8 10*9/L (ref 1.8–7.8)
NEUTROPHILS RELATIVE PERCENT: 70.6 %
NUCLEATED RED BLOOD CELLS: 0 /100{WBCs} (ref <=4)
PLATELET COUNT: 166 10*9/L (ref 150–450)
RED BLOOD CELL COUNT: 4.81 10*12/L (ref 4.26–5.60)
RED CELL DISTRIBUTION WIDTH: 14.8 % (ref 12.2–15.2)
WBC ADJUSTED: 4 10*9/L (ref 3.6–11.2)

## 2022-05-27 LAB — BASIC METABOLIC PANEL
ANION GAP: 8 mmol/L (ref 5–14)
BLOOD UREA NITROGEN: 18 mg/dL (ref 9–23)
BUN / CREAT RATIO: 26
CALCIUM: 9.7 mg/dL (ref 8.7–10.4)
CHLORIDE: 107 mmol/L (ref 98–107)
CO2: 24.8 mmol/L (ref 20.0–31.0)
CREATININE: 0.7 mg/dL
EGFR CKD-EPI (2021) MALE: >90 mL/min/{1.73_m2} (ref >=60)
GLUCOSE RANDOM: 115 mg/dL (ref 70–179)
POTASSIUM: 4.4 mmol/L (ref 3.4–4.8)
SODIUM: 140 mmol/L (ref 135–145)

## 2022-05-27 LAB — TACROLIMUS LEVEL, TROUGH: TACROLIMUS, TROUGH: 9.6 ng/mL (ref 5.0–15.0)

## 2022-05-27 LAB — MAGNESIUM: MAGNESIUM: 1.9 mg/dL (ref 1.6–2.6)

## 2022-05-27 LAB — PHOSPHORUS: PHOSPHORUS: 3.2 mg/dL (ref 2.4–5.1)

## 2022-06-06 NOTE — Unmapped (Signed)
[facility] Refill Coordination Note    Specialty Medication(s) to be Shipped:   Transplant: Envarsus 1mg  and Myfortic 180 mg    Other medication(s) to be shipped:  mag-ox and metoprolol      [name], DOB: 1961/07/21  Phone: [phone] (home)       All above HIPAA information was verified with patient.     Was a Nurse, learning disability used for this call? No    Completed refill call assessment today to schedule patient's medication shipment from the [facility] [phone]).  All relevant notes have been reviewed.     Specialty medication(s) and dose(s) confirmed: Regimen is correct and unchanged.   Changes to medications: Jaspreet reports no changes at this time.  Changes to insurance: No  New side effects reported not previously addressed with a pharmacist or physician: None reported  Questions for the pharmacist: No    Confirmed patient received a Conservation officer, historic buildings and a Surveyor, mining with first shipment. The patient will receive a drug information handout for each medication shipped and additional FDA Medication Guides as required.       DISEASE/MEDICATION-SPECIFIC INFORMATION        N/A    SPECIALTY MEDICATION ADHERENCE     Medication Adherence    Patient reported X missed doses in the last month: 0  Specialty Medication: Envarsus 1 mg  Patient is on additional specialty medications: Yes  Additional Specialty Medications: Myfortic 180 mg   Patient Reported Additional Medication X Missed Doses in the Last Month: 0  Patient is on more than two specialty medications: No              Were doses missed due to medication being on hold? No    Envarsus 1 mg: 10 days of medicine on hand   Myfortic 180 mg: 10 days of medicine on hand       REFERRAL TO PHARMACIST     Referral to the pharmacist: Not needed      [facility]     Shipping address confirmed in Epic.     Delivery Scheduled: Yes, Expected medication delivery date: 06/14/22.     Medication will be delivered via Next Day Courier to the prescription address in Epic WAM.    [name]   [facility] Specialty Technician

## 2022-06-13 MED FILL — METOPROLOL SUCCINATE ER 25 MG TABLET,EXTENDED RELEASE 24 HR: ORAL | 30 days supply | Qty: 30 | Fill #3

## 2022-06-13 MED FILL — MG-PLUS-PROTEIN 133 MG TABLET: ORAL | 30 days supply | Qty: 30 | Fill #9

## 2022-06-13 MED FILL — MYFORTIC 180 MG TABLET,DELAYED RELEASE: ORAL | 30 days supply | Qty: 180 | Fill #5

## 2022-06-13 NOTE — Unmapped (Signed)
Patient called with new floater right eye. Denies shadows, veil or curtain appearance, spider webs, flashes of light. Advised to go to ED if these occur. Advised no heavy lifting/exercise. Scheduled for tomorrow.

## 2022-06-14 ENCOUNTER — Ambulatory Visit: Admit: 2022-06-14 | Discharge: 2022-06-15 | Payer: MEDICARE

## 2022-06-14 DIAGNOSIS — H3589 Other specified retinal disorders: Principal | ICD-10-CM

## 2022-06-14 DIAGNOSIS — H43391 Other vitreous opacities, right eye: Principal | ICD-10-CM

## 2022-06-14 NOTE — Unmapped (Signed)
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     # Posterior Vitreous Detachment, OD:  - c/o floaters 1 week(s) ago  - Hx myopia, no trauma, no intraocular surgery  - Exam: positive for Weiss ring, vitreous syneresis  - No retinal break / tear / hole noted on scleral depressed exam 360today    Plan:  - The nature of posterior vitreous detachment was discussed with the patient as well as its physiology, its age prevalence, and its possible implication regarding retinal breaks and detachment. All the patient`s questions were answered. The patient was asked to return if new or different flashes, floaters, or curtain shades develop.  - RTC 1 month for PVD f/u w/ any available CAP    2. 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 Cataracts OU  --Patient w/ no report of worsening vision  --Patient prefers to monitor and return prn for surgical eval    Pt seen with Dr. Ala Dach, discussed with Dr. Dell Ponto.    Margret Chance, MD  PGY-2, Ophthalmology

## 2022-06-15 NOTE — Unmapped (Signed)
I was immediately available via phone/pager or present on site.  I reviewed and discussed the case with the resident, but did not see the patient.  I agree with the assessment and plan as documented in the resident's note. Claris Pong, MD

## 2022-06-29 NOTE — Unmapped (Signed)
[facility] Pharm[facility] Clinical Assessment & Refill Coordination Note    Louis Dennis, Bassett: 09/12/1961  Phone: [phone] (home)     All above HIPAA information was verified with patient.     Was a Nurse, learning disability used for this call? 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 30 tablet 11    metoprolol succinate (TOPROL XL) 25 MG 24 hr tablet Take 1 tablet (25 mg total) by mouth nightly. 30 tablet 11    MYFORTIC 180 mg EC tablet Take 3 tablets (540 mg total) by mouth Two (2) times a day. 180 tablet 11    tacrolimus (ENVARSUS XR) 1 mg Tb24 extended release tablet Take 3 tablets (3 mg total) by mouth in the morning. 270 tablet 3     No current facility-administered medications for this visit.        Changes to medications: [name] reports no changes at this time.    No Known Allergies    Changes to allergies: No    SPECIALTY MEDICATION ADHERENCE     Envarsus Xr 1 mg: 15 days of medicine on hand   Myfortic 180 mg: 15 days of medicine on hand     Medication Adherence    Patient reported X missed doses in the last month: 0  Specialty Medication: Envarsus Xr 1mg   Patient is on additional specialty medications: Yes  Additional Specialty Medications: Myfortic 180mg   Patient Reported Additional Medication X Missed Doses in the Last Month: 0  Patient is on more than two specialty medications: No          Specialty medication(s) dose(s) confirmed: Regimen is correct and unchanged.     Are there any concerns with adherence? No    Adherence counseling provided? Not needed    CLINICAL MANAGEMENT AND INTERVENTION      Clinical Benefit Assessment:    Do you feel the medicine is effective or helping your condition? Yes    Clinical Benefit counseling provided? Not needed    Adverse Effects Assessment:    Are you experiencing any side effects? No    Are you experiencing difficulty administering your medicine? No    Quality of Life Assessment:           How many days over the past month did your kidney transplant  keep you from your normal activities? For example, brushing your teeth or getting up in the morning. 0    Have you discussed this with your provider? Not needed    Acute Infection Status:    Acute infections noted within Epic:  No active infections  Patient reported infection: None    Therapy Appropriateness:    Is therapy appropriate and patient progressing towards therapeutic goals? Yes, therapy is appropriate and should be continued    DISEASE/MEDICATION-SPECIFIC INFORMATION      N/A    PATIENT SPECIFIC NEEDS     Does the patient have any physical, cognitive, or cultural barriers? No    Is the patient high risk? Yes, patient is taking a REMS drug. Medication is dispensed in compliance with REMS program    Does the patient require a Care Management Plan? No     SOCIAL DETERMINANTS OF HEALTH     At the [facility], we have learned  that life circumstances - like trouble affording food, housing, utilities, or transportation can affect the health of many of our patients.   That is why we wanted to ask: are you currently experiencing any life circumstances that are negatively impacting your health and/or quality of life? Patient declined to answer    Social Determinants of Health     Financial Resource Strain: Low Risk     Difficulty of Paying Living Expenses: Not hard at all   Internet Connectivity: Not on file   Food Insecurity: No Food Insecurity    Worried About Programme researcher, broadcasting/film/video in the Last Year: Never true    Barista in the Last Year: Never true   Tobacco Use: Medium Risk    Smoking Tobacco Use: Former    Smokeless Tobacco Use: Never    Passive Exposure: Not on file   Housing/Utilities: Low Risk     Within the past 12 months, have you ever stayed: outside, in a car, in a tent, in an overnight shelter, or temporarily in someone else's home (i.e. couch-surfing)?: No    Are you worried about losing your housing?: No    Within the past 12 months, have you been unable to get utilities (heat, electricity) when it was really needed?: No   Alcohol Use: Not At Risk    How often do you have a drink containing alcohol?: Never    How many drinks containing alcohol do you have on a typical day when you are drinking?: Not on file    How often do you have 5 or more drinks on one occasion?: Never   Transportation Needs: No Transportation Needs    Lack of Transportation (Medical): No    Lack of Transportation (Non-Medical): No   Substance Use: Low Risk     Taken prescription drugs for non-medical reasons: Never    Taken illegal drugs: Never    Patient indicated they have taken drugs in the past year for non-medical reasons: Yes, [positive answer(s)]: Not on file   Health Literacy: High Risk    : Often   Physical Activity: Not on file   Interpersonal Safety: Not on file   Stress: Not on file   Intimate Partner Violence: Not on file   Depression: Not at risk    PHQ-2 Score: 0   Social Connections: Not on file       Would you be willing to receive help with any of the needs that you have identified today? Not applicable       SHIPPING     Specialty Medication(s) to be Shipped:   Transplant: Patient declined envarsus and myfortic today.    Other medication(s) to be shipped: No additional medications requested for fill at this time     Changes to insurance: No    Delivery Scheduled: Patient declined refill at this time due to has at least 2 weeks on hand..     Medication will be delivered via UPS to the confirmed prescription address in [facility].    The patient will receive a drug information handout for each medication shipped and additional FDA Medication Guides as required.  Verified that patient has previously received a Conservation officer, historic buildings and a Surveyor, mining.    The patient or caregiver noted above participated in the development of this care plan and knows that they can request review of or adjustments to the care plan at any time.      All of the  patient's questions and concerns have been addressed.    Tera Helper   [facility] Specialty Pharmacist

## 2022-06-30 ENCOUNTER — Ambulatory Visit: Admit: 2022-06-30 | Discharge: 2022-07-01 | Payer: MEDICARE

## 2022-06-30 LAB — CBC W/ AUTO DIFF
BASOPHILS ABSOLUTE COUNT: 0 10*9/L (ref 0.0–0.1)
BASOPHILS RELATIVE PERCENT: 1.1 %
EOSINOPHILS ABSOLUTE COUNT: 0.3 10*9/L (ref 0.0–0.5)
EOSINOPHILS RELATIVE PERCENT: 6.4 %
HEMATOCRIT: 43.6 % (ref 39.0–48.0)
HEMOGLOBIN: 14.5 g/dL (ref 12.9–16.5)
LYMPHOCYTES ABSOLUTE COUNT: 0.6 10*9/L — ABNORMAL LOW (ref 1.1–3.6)
LYMPHOCYTES RELATIVE PERCENT: 12.9 %
MEAN CORPUSCULAR HEMOGLOBIN CONC: 33.4 g/dL (ref 32.0–36.0)
MEAN CORPUSCULAR HEMOGLOBIN: 31.1 pg (ref 25.9–32.4)
MEAN CORPUSCULAR VOLUME: 93.3 fL (ref 77.6–95.7)
MEAN PLATELET VOLUME: 8.4 fL (ref 6.8–10.7)
MONOCYTES ABSOLUTE COUNT: 0.4 10*9/L (ref 0.3–0.8)
MONOCYTES RELATIVE PERCENT: 8.1 %
NEUTROPHILS ABSOLUTE COUNT: 3.3 10*9/L (ref 1.8–7.8)
NEUTROPHILS RELATIVE PERCENT: 71.5 %
NUCLEATED RED BLOOD CELLS: 0 /100{WBCs} (ref <=4)
PLATELET COUNT: 175 10*9/L (ref 150–450)
RED BLOOD CELL COUNT: 4.67 10*12/L (ref 4.26–5.60)
RED CELL DISTRIBUTION WIDTH: 14.4 % (ref 12.2–15.2)
WBC ADJUSTED: 4.6 10*9/L (ref 3.6–11.2)

## 2022-06-30 LAB — COMPREHENSIVE METABOLIC PANEL
ALBUMIN: 4.7 g/dL (ref 3.4–5.0)
ALKALINE PHOSPHATASE: 73 U/L (ref 46–116)
ALT (SGPT): 17 U/L (ref 10–49)
ANION GAP: 8 mmol/L (ref 5–14)
AST (SGOT): 13 U/L (ref <=34)
BILIRUBIN TOTAL: 0.9 mg/dL (ref 0.3–1.2)
BLOOD UREA NITROGEN: 18 mg/dL (ref 9–23)
BUN / CREAT RATIO: 25
CALCIUM: 9.5 mg/dL (ref 8.7–10.4)
CHLORIDE: 109 mmol/L — ABNORMAL HIGH (ref 98–107)
CO2: 24 mmol/L (ref 20.0–31.0)
CREATININE: 0.72 mg/dL
EGFR CKD-EPI (2021) MALE: >90 mL/min/{1.73_m2} (ref >=60)
GLUCOSE RANDOM: 118 mg/dL (ref 70–179)
POTASSIUM: 4.5 mmol/L (ref 3.4–4.8)
PROTEIN TOTAL: 7.1 g/dL (ref 5.7–8.2)
SODIUM: 141 mmol/L (ref 135–145)

## 2022-06-30 LAB — CMV DNA, QUANTITATIVE, PCR: CMV VIRAL LD: NOT DETECTED

## 2022-06-30 LAB — LIPID PANEL
CHOLESTEROL/HDL RATIO SCREEN: 3.3 (ref 1.0–4.5)
CHOLESTEROL: 142 mg/dL (ref <=200)
HDL CHOLESTEROL: 43 mg/dL (ref 40–60)
LDL CHOLESTEROL CALCULATED: 84 mg/dL (ref 40–99)
NON-HDL CHOLESTEROL: 99 mg/dL (ref 70–130)
TRIGLYCERIDES: 74 mg/dL (ref 0–150)
VLDL CHOLESTEROL CAL: 14.8 mg/dL (ref 12–42)

## 2022-06-30 LAB — HEMOGLOBIN A1C
ESTIMATED AVERAGE GLUCOSE: 117 mg/dL
HEMOGLOBIN A1C: 5.7 % — ABNORMAL HIGH (ref 4.8–5.6)

## 2022-06-30 LAB — MAGNESIUM: MAGNESIUM: 1.8 mg/dL (ref 1.6–2.6)

## 2022-06-30 LAB — PHOSPHORUS: PHOSPHORUS: 2.8 mg/dL (ref 2.4–5.1)

## 2022-06-30 LAB — TACROLIMUS LEVEL, TROUGH: TACROLIMUS, TROUGH: 8.8 ng/mL (ref 5.0–15.0)

## 2022-06-30 LAB — BILIRUBIN, DIRECT: BILIRUBIN DIRECT: 0.3 mg/dL (ref 0.00–0.30)

## 2022-07-01 LAB — BK VIRUS QUANTITATIVE PCR, BLOOD: BK BLOOD RESULT: NOT DETECTED

## 2022-07-08 ENCOUNTER — Ambulatory Visit: Admit: 2022-07-08 | Discharge: 2022-07-09 | Payer: MEDICARE | Attending: Family | Primary: Family

## 2022-07-08 NOTE — Unmapped (Signed)
Assessment and Plan:     {No diagnosis found. (Refresh or delete this SmartLink)}    I personally spent 30 minutes face-to-face and non-face-to-face in the care of this patient, which includes all pre, intra, and post visit time on the date of service.    Return in about 4 months (around 11/07/2022) for Next scheduled follow up.    HPI:      Louis Dennis is here for   Chief Complaint   Patient presents with    Hypertension     Fasting.  No concerns.    Prediabetes     Hypertension: Patient presents for {evaluation/follow-up:17928::follow-up} of hypertension. Blood pressure goal < 140/90.  Hypertension has customarily {been/notbeen:38678} at goal complicated by ***.  Home blood pressure readings: {home bp readings:17448}. Salt intake and diet: {salt intake/diet:17449}. Associated signs and symptoms: {symptoms hypertension:17452}. Patient denies: {symptoms hypertension:19611}. Medication compliance: {med compliance:10573}. He {is/is not:23060} doing regular exercise.     2000 ml of water (mostly)     Nephrology every 4 months.     Right eye floater  Check by eye doctor a few months ago.   August 16th recheck.   About 30% improved.        ROS:      Comprehensive 10 point ROS negative unless otherwise stated in the HPI.      [facility] Components:     Medication adherence and barriers to the treatment plan have been addressed. Opportunities to optimize healthy behaviors have been discussed. Patient / caregiver voiced understanding.    Past Medical/Surgical History:     Past Medical History:   Diagnosis Date    Abnormal thyroid function test     End stage renal disease (CMS-HCC)     now on peritoneal dialysis    Gout     reports was in Right hand, foot and left elbow    Hypertension     takes meds intermitttently based on readings    Nephrolithiasis 2014    Visual impairment     glasses     Past Surgical History:   Procedure Laterality Date    OTHER SURGICAL HISTORY Right 08/2017    Pt had catheter moved from left side to right side.    pd catheter  2012    PR COLONOSCOPY W/BIOPSY SINGLE/MULTIPLE N/A 06/15/2018    Procedure: COLONOSCOPY, FLEXIBLE, PROXIMAL TO SPLENIC FLEXURE; WITH BIOPSY, SINGLE OR MULTIPLE;  Surgeon: Charm Rings, MD;  [facility];  Service: Gastroenterology    PR COLSC FLX W/RMVL OF TUMOR POLYP LESION SNARE TQ N/A 06/15/2018    Procedure: COLONOSCOPY FLEX; W/REMOV TUMOR/LES BY SNARE;  Surgeon: Charm Rings, MD;  Location: [facility];  Service: Gastroenterology    PR EXPLORATORY OF ABDOMEN N/A 11/22/2018    Procedure: EXPLORATORY LAPAROTOMY, EXPLORATORY CELIOTOMY WITH OR WITHOUT BIOPSY(S);  Surgeon: Lawrence Marseilles Day Thurnell Lose, MD;  Location: MAIN OR [facility];  Service: Trauma    PR EXPLORE PARATHYROID GLANDS N/A 11/11/2019    Procedure: RS 22 PARATHYROIDECTOMY OR EXPLORATION OF PARATHYROID(S);  Surgeon: Johny Chess, MD;  Location: MA[facility] OR [facility];  Service: Surgical Oncology    PR FREEING BOWEL ADHESION,ENTEROLYSIS N/A 11/22/2018    Procedure: Enterolysis (Separt Proc);  Surgeon: Lawrence Marseilles Day Thurnell Lose, MD;  Location: MAIN OR [name];  Service: Trauma    PR REMOVE PERITONEAL FOREIGN BODY N/A 11/22/2018    Procedure: Removal Of Peritoneal Of Foreign Body From Peritoneal Cavity;  Surgeon: Lawrence Marseilles Day Thurnell Lose, MD;  Location: MAIN OR [place];  Service: Trauma    PR TRANSPLANT,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    Procedure: [facility] STD PREP CAD DONR RENAL ALLOGFT PRIOR TO TRNSPLNT, INCL DISSEC/REM PERINEPH FAT, DIAPH/RTPER ATTAC;  Surgeon: Doyce Loose, MD;  Location: MAIN OR [place];  Service: Transplant    PR TRANSPLANTATION OF KIDNEY N/A 05/24/2019    Procedure: RENAL ALLOTRANSPLANTATION, IMPLANTATION OF GRAFT; WITHOUT RECIPIENT NEPHRECTOMY;  Surgeon: Doyce Loose, MD;  Location: MAIN OR [place];  Service: Transplant       Family History:     Family History   Problem Relation Age of Onset    Hypertension Mother     Kidney disease Mother Diabetes Mother     Hypertension Father     Kidney disease Sister         s/p transplant    Hypertension Brother     Glaucoma Neg Hx     Amblyopia Neg Hx     Blindness Neg Hx     Retinal detachment Neg Hx     Strabismus Neg Hx     Macular degeneration Neg Hx     Basal cell carcinoma Neg Hx     Melanoma Neg Hx     Squamous cell carcinoma Neg Hx        Social History:     Social History     Tobacco Use    Smoking status: Former     Packs/day: 1.00     Years: 30.00     Pack years: 30.00     Types: Cigarettes     Quit date: 07/10/2009     Years since quitting: 13.0     Passive exposure: Never    Smokeless tobacco: Never   Vaping Use    Vaping Use: Never used   Substance Use Topics    Alcohol use: No     Alcohol/week: 0.0 standard drinks    Drug use: No       Allergies:     Patient has no known allergies.    Current Medications:     Current Outpatient Medications   Medication Sig Dispense Refill    chlorhexidine (PERIDEX) 0.12 % solution RINSE 1/2 OZ FOR 30 SECONDS THEN SPIT OUT. DO NOT RINSE WITH WATER AND DO NOT SWALLOW. USE AM AND PM      DENTA 5000 PLUS 1.1 % Crea USE DAY AND NIGHT, BRUSH THOROUGHLY FOR 2 MINS AND SPIT OUT. DO NOT SWALLOW AND RINSE LIGHTLY      magnesium oxide-Mg AA chelate (MAGNESIUM, AMINO ACID CHELATE,) 133 mg Take 1 tablet by mouth daily. 30 tablet 11    metoprolol succinate (TOPROL XL) 25 MG 24 hr tablet Take 1 tablet (25 mg total) by mouth nightly. (Patient taking differently: Take 0.5 tablets (12.5 mg total) by mouth nightly.) 30 tablet 11    MYFORTIC 180 mg EC tablet Take 3 tablets (540 mg total) by mouth Two (2) times a day. 180 tablet 11    tacrolimus (ENVARSUS XR) 1 mg Tb24 extended release tablet Take 3 tablets (3 mg total) by mouth in the morning. 270 tablet 3     No current facility-administered medications for this visit.       Health Maintenance:     Health Maintenance   Topic Date Due    Meningococcal B Vaccines (1 of 4 - Increased Risk Bexsero 2-dose series) Never done    COVID-19 quitting: 13.0     Passive exposure: Never  Smokeless tobacco: Never   Vaping Use    Vaping Use: Never used   Substance Use Topics    Alcohol use: No     Alcohol/week: 0.0 standard drinks    Drug use: No       Allergies:     Patient has no known allergies.    Current Medications:     Current Outpatient Medications   Medication Sig Dispense Refill    chlorhexidine (PERIDEX) 0.12 % solution RINSE 1/2 OZ FOR 30 SECONDS THEN SPIT OUT. DO NOT RINSE WITH WATER AND DO NOT SWALLOW. USE AM AND PM      DENTA 5000 PLUS 1.1 % Crea USE DAY AND NIGHT, BRUSH THOROUGHLY FOR 2 MINS AND SPIT OUT. DO NOT SWALLOW AND RINSE LIGHTLY      magnesium oxide-Mg AA chelate (MAGNESIUM, AMINO ACID CHELATE,) 133 mg Take 1 tablet by mouth daily. 30 tablet 11    metoprolol succinate (TOPROL XL) 25 MG 24 hr tablet Take 1 tablet (25 mg total) by mouth nightly. (Patient taking differently: Take 0.5 tablets (12.5 mg total) by mouth nightly.) 30 tablet 11    MYFORTIC 180 mg EC tablet Take 3 tablets (540 mg total) by mouth Two (2) times a day. 180 tablet 11    tacrolimus (ENVARSUS XR) 1 mg Tb24 extended release tablet Take 3 tablets (3 mg total) by mouth in the morning. 270 tablet 3     No current facility-administered medications for this visit.       Health Maintenance:     Health Maintenance   Topic Date Due    Meningococcal B Vaccines (1 of 4 - Increased Risk Bexsero 2-dose series) Never done    COVID-19 Vaccine (5 - Booster for Moderna series) 05/12/2021    Influenza Vaccine (1) 08/05/2022    Lipid Screening  07/01/2027    Colon Cancer Screening  06/15/2028    DTaP/Tdap/Td Vaccines (2 - Td or Tdap) 12/30/2029    Pneumococcal Vaccine 0-64  Completed    Hepatitis C Screen  Completed    Zoster Vaccines  Discontinued       Immunizations:     Immunization History   Administered Date(s) Administered    COVID-19 VACCINE,MRNA(MODERNA)(PF) 04/12/2020, 05/10/2020, 08/15/2020, 03/17/2021    INFLUENZA INJ MDCK PF, QUAD,(FLUCELVAX)(76MO AND UP EGG FREE) Weight as of this encounter: 64.4 kg (142 lb).  Facility age limit for growth percentiles is 20 years.      Objective:      General: Alert and oriented x3. Well-appearing. No acute distress.   HEENT:  Normocephalic.  Atraumatic. Conjunctiva and sclera normal. OP MMM without lesions.   Neck:  Supple. No thyroid enlargement. No adenopathy.   Heart:  Regular rate and rhythm. Normal S1, S2. No murmurs, rubs or gallops.   Lungs:  No respiratory distress.  Lungs clear to auscultation. No wheezes, rhonchi, or rales.   GI/GU:  Soft, +BS, nondistended, non-TTP. No palpable masses or organomegaly.   Extremities:  No edema. Peripheral pulses normal.   Skin:  Warm, dry. No rash or lesions present.   Neuro:  Non-focal. No obvious weakness.   Psych:  Affect normal, eye contact good, speech clear and coherent.     Noralyn Pick, FNP nondistended, non-TTP. No palpable masses or organomegaly.   Extremities:  No edema. Peripheral pulses normal.   Skin:  Warm, dry. No rash or lesions present.   Neuro:  Non-focal. No obvious weakness.   Psych:  Affect normal, eye contact good, speech  clear and coherent.     Noralyn Pick, FNP

## 2022-07-12 LAB — FSAB CLASS 2 ANTIBODY SPECIFICITY: HLA CL2 AB RESULT: NEGATIVE

## 2022-07-12 LAB — HLA DS POST TRANSPLANT
ANTI-DONOR DRW #1 MFI: 0 MFI
ANTI-DONOR DRW #2 MFI: 0 MFI
ANTI-DONOR HLA-A #2 MFI: 7 MFI
ANTI-DONOR HLA-B #1 MFI: 1 MFI
ANTI-DONOR HLA-B #2 MFI: 0 MFI
ANTI-DONOR HLA-C #1 MFI: 7 MFI
ANTI-DONOR HLA-C #2 MFI: 7 MFI
ANTI-DONOR HLA-DQB #1 MFI: 0 MFI
ANTI-DONOR HLA-DQB #2 MFI: 0 MFI
ANTI-DONOR HLA-DR #1 MFI: 0 MFI
ANTI-DONOR HLA-DR #2 MFI: 0 MFI

## 2022-07-12 LAB — FSAB CLASS 1 ANTIBODY SPECIFICITY: HLA CLASS 1 ANTIBODY RESULT: NEGATIVE

## 2022-07-20 ENCOUNTER — Ambulatory Visit
Admit: 2022-07-20 | Payer: MEDICARE | Attending: Student in an Organized Health Care Education/Training Program | Primary: Student in an Organized Health Care Education/Training Program

## 2022-07-21 NOTE — Unmapped (Signed)
[facility] Specialty Pharmacy Refill Coordination Note    Specialty Medication(s) to be Shipped:   Transplant: Envarsus 1mg  and Myfortic 180mg     Other medication(s) to be shipped:  magensium,metoprolol     Louis Dennis, DOB: 04-22-1961  Phone: [phone] (home)       All above HIPAA information was verified with patient.     Was a Nurse, learning disability used for this call? No    Completed refill call assessment today to schedule patient's medication shipment from the [facility] [phone]).  All relevant notes have been reviewed.     Specialty medication(s) and dose(s) confirmed: Regimen is correct and unchanged.   Changes to medications: Abriel reports no changes at this time.  Changes to insurance: No  New side effects reported not previously addressed with a pharmacist or physician: None reported  Questions for the pharmacist: No    Confirmed patient received a Conservation officer, historic buildings and a Surveyor, mining with first shipment. The patient will receive a drug information handout for each medication shipped and additional FDA Medication Guides as required.       DISEASE/MEDICATION-SPECIFIC INFORMATION        N/A    SPECIALTY MEDICATION ADHERENCE     Medication Adherence    Patient reported X missed doses in the last month: 0  Specialty Medication: Envarsus 1 mg  Patient is on additional specialty medications: Yes  Additional Specialty Medications: Myfortic 180 mg   Patient Reported Additional Medication X Missed Doses in the Last Month: 0  Patient is on more than two specialty medications: No  Any gaps in refill history greater than 2 weeks in the last 3 months: no  Demonstrates understanding of importance of adherence: yes  Informant: patient  Reliability of informant: reliable  Provider-estimated medication adherence level: good  Patient is at risk for Non-Adherence: No  Reasons for non-adherence: no problems identified                  Confirmed plan for next specialty medication refill: delivery by pharmacy  Refills needed for supportive medications: not needed          Refill Coordination    Has the Patients' Contact Information Changed: No  Is the Shipping Address Different: No         Were doses missed due to medication being on hold? No    myfortic 180 mg: 6 days of medicine on hand   envarsus 1 mg: 6 days of medicine on hand       REFERRAL TO PHARMACIST     Referral to the pharmacist: Not needed      [facility]     Shipping address confirmed in Epic.     Delivery Scheduled: Yes, Expected medication delivery date: 08/22.     Medication will be delivered via Next Day Courier to the prescription address in Epic WAM.    Antonietta Barcelona   [facility] Specialty Technician

## 2022-07-25 DIAGNOSIS — R82998 Other abnormal findings in urine: Principal | ICD-10-CM

## 2022-07-25 DIAGNOSIS — Z940 Kidney transplant status: Principal | ICD-10-CM

## 2022-07-25 MED FILL — MYFORTIC 180 MG TABLET,DELAYED RELEASE: ORAL | 30 days supply | Qty: 180 | Fill #6

## 2022-07-25 MED FILL — METOPROLOL SUCCINATE ER 25 MG TABLET,EXTENDED RELEASE 24 HR: ORAL | 30 days supply | Qty: 30 | Fill #4

## 2022-07-25 MED FILL — ENVARSUS XR 1 MG TABLET,EXTENDED RELEASE: ORAL | 30 days supply | Qty: 90 | Fill #1

## 2022-07-25 MED FILL — MG-PLUS-PROTEIN 133 MG TABLET: ORAL | 30 days supply | Qty: 30 | Fill #10

## 2022-07-27 ENCOUNTER — Ambulatory Visit: Admit: 2022-07-27 | Discharge: 2022-07-28 | Payer: MEDICARE

## 2022-07-27 LAB — URINALYSIS WITH MICROSCOPY
BACTERIA: NONE SEEN /HPF
BILIRUBIN UA: NEGATIVE
BLOOD UA: NEGATIVE
GLUCOSE UA: NEGATIVE
LEUKOCYTE ESTERASE UA: NEGATIVE
NITRITE UA: NEGATIVE
PH UA: 5 (ref 5.0–9.0)
PROTEIN UA: NEGATIVE
RBC UA: <1 /HPF (ref <=3)
SPECIFIC GRAVITY UA: 1.015 (ref 1.003–1.030)
SQUAMOUS EPITHELIAL: <1 /HPF (ref 0–5)
UROBILINOGEN UA: <2
WBC UA: <1 /HPF (ref <=2)

## 2022-07-27 LAB — CBC W/ AUTO DIFF
BASOPHILS ABSOLUTE COUNT: 0 10*9/L (ref 0.0–0.1)
BASOPHILS RELATIVE PERCENT: 0.7 %
EOSINOPHILS ABSOLUTE COUNT: 0.2 10*9/L (ref 0.0–0.5)
EOSINOPHILS RELATIVE PERCENT: 4.6 %
HEMATOCRIT: 45.2 % (ref 39.0–48.0)
HEMOGLOBIN: 14.6 g/dL (ref 12.9–16.5)
LYMPHOCYTES ABSOLUTE COUNT: 0.5 10*9/L — ABNORMAL LOW (ref 1.1–3.6)
LYMPHOCYTES RELATIVE PERCENT: 13.4 %
MEAN CORPUSCULAR HEMOGLOBIN CONC: 32.3 g/dL (ref 32.0–36.0)
MEAN CORPUSCULAR HEMOGLOBIN: 30.1 pg (ref 25.9–32.4)
MEAN CORPUSCULAR VOLUME: 93 fL (ref 77.6–95.7)
MEAN PLATELET VOLUME: 9 fL (ref 6.8–10.7)
MONOCYTES ABSOLUTE COUNT: 0.3 10*9/L (ref 0.3–0.8)
MONOCYTES RELATIVE PERCENT: 7.9 %
NEUTROPHILS ABSOLUTE COUNT: 3 10*9/L (ref 1.8–7.8)
NEUTROPHILS RELATIVE PERCENT: 73.4 %
NUCLEATED RED BLOOD CELLS: 0 /100{WBCs} (ref <=4)
PLATELET COUNT: 161 10*9/L (ref 150–450)
RED BLOOD CELL COUNT: 4.86 10*12/L (ref 4.26–5.60)
RED CELL DISTRIBUTION WIDTH: 14.1 % (ref 12.2–15.2)
WBC ADJUSTED: 4 10*9/L (ref 3.6–11.2)

## 2022-07-27 LAB — COMPREHENSIVE METABOLIC PANEL
ALBUMIN: 4.5 g/dL (ref 3.4–5.0)
ALKALINE PHOSPHATASE: 74 U/L (ref 46–116)
ALT (SGPT): 15 U/L (ref 10–49)
ANION GAP: 9 mmol/L (ref 5–14)
AST (SGOT): 12 U/L (ref <=34)
BILIRUBIN TOTAL: 0.8 mg/dL (ref 0.3–1.2)
BLOOD UREA NITROGEN: 20 mg/dL (ref 9–23)
BUN / CREAT RATIO: 28
CALCIUM: 9.9 mg/dL (ref 8.7–10.4)
CHLORIDE: 108 mmol/L — ABNORMAL HIGH (ref 98–107)
CO2: 22.8 mmol/L (ref 20.0–31.0)
CREATININE: 0.71 mg/dL
EGFR CKD-EPI (2021) MALE: >90 mL/min/{1.73_m2} (ref >=60)
GLUCOSE RANDOM: 133 mg/dL — ABNORMAL HIGH (ref 70–99)
POTASSIUM: 4.5 mmol/L (ref 3.4–4.8)
PROTEIN TOTAL: 6.9 g/dL (ref 5.7–8.2)
SODIUM: 140 mmol/L (ref 135–145)

## 2022-07-27 LAB — LIPID PANEL
CHOLESTEROL/HDL RATIO SCREEN: 3.6 (ref 1.0–4.5)
CHOLESTEROL: 168 mg/dL (ref <=200)
HDL CHOLESTEROL: 47 mg/dL (ref 40–60)
LDL CHOLESTEROL CALCULATED: 98 mg/dL (ref 40–99)
NON-HDL CHOLESTEROL: 121 mg/dL (ref 70–130)
TRIGLYCERIDES: 113 mg/dL (ref 0–150)
VLDL CHOLESTEROL CAL: 22.6 mg/dL (ref 12–42)

## 2022-07-27 LAB — TACROLIMUS LEVEL, TROUGH: TACROLIMUS, TROUGH: 9.3 ng/mL (ref 5.0–15.0)

## 2022-07-27 LAB — BILIRUBIN, DIRECT: BILIRUBIN DIRECT: 0.3 mg/dL (ref 0.00–0.30)

## 2022-07-27 LAB — PROTEIN / CREATININE RATIO, URINE
CREATININE, URINE: 51.9 mg/dL
PROTEIN URINE: <6 mg/dL

## 2022-07-27 LAB — PHOSPHORUS: PHOSPHORUS: 3 mg/dL (ref 2.4–5.1)

## 2022-07-27 LAB — MAGNESIUM: MAGNESIUM: 1.9 mg/dL (ref 1.6–2.6)

## 2022-07-28 ENCOUNTER — Ambulatory Visit: Admit: 2022-07-28 | Discharge: 2022-07-29 | Payer: MEDICARE | Attending: Nephrology | Primary: Nephrology

## 2022-07-28 LAB — CMV DNA, QUANTITATIVE, PCR: CMV VIRAL LD: NOT DETECTED

## 2022-07-28 NOTE — Unmapped (Signed)
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          07/28/22 1359   BP: 101/66   Pulse: 72   Temp: 36.4 ??C (97.6 ??F)   Weight: 64 kg (141 lb)   Height: 160 cm (5' 2.99)   PainSc: 0-No pain     Patient feels well overall.  He reports improvement in diet, cutting out carbohydrates.  He denies any questions or concerns today.    Assessment  BP: reports home BP's of 100's-110's  Lightheaded: denies  Headache: denies  Hand tremors: none  Numbness/tingling: none  Fevers: denies  Chills/sweats: none  Shortness of breath: none  Chest pain or pressure: none  Palpitations: none  Abdominal pain: denies  Heart burn: denies  Nausea/vomiting: none  Diarrhea/constipation: denies  UTI symptoms: denies  Swelling: no swelling  Sleep: sleeps well      Good appetite; reports adequate hydration.     Intake: reports 2 liters per day  Output: reports adequate    Any new medications? no  Immunosuppressant last taken: 0900 today, Envarsus    Immunization status: up to date    Functional Score: 100   Normal no complaints; no evidence of  disease.     I spent a total of 20 minutes with Louis Dennis reviewing medications and symptoms.

## 2022-07-28 NOTE — Unmapped (Signed)
Transplant Nephrology Clinic Visit    History of Present Illness  [name] is a 62 y.o. 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  -Adequate perfusion of the transplant kidney. Resistive indices within the mid main renal artery and at the anastomosis are slightly elevated. Attention on follow-up.     Current Immunosuppression Regimen:   - Myfortic 540mg  BID  - Envarsus 3 mg daily    Subjective/Interval:     BP running low 100-110s/60s--he is taking metoprolol daily since an emergency room visit for tachycardia years ago. Dose was reduced to 12.5 mg daily. HR 80s-90s when moving around.  Change envarsus dose to 2.5 mg daily for level of 9.3    No chest pain, no shortness of breath at rest, no nausea, no vomiting, no diarrhea.  No pain urinating, no trouble passing urine, no visible blood in urine.    Review of Systems  Otherwise as per HPI, all other systems reviewed and are negative.    Medications  Current Outpatient Medications   Medication Sig Dispense Refill chlorhexidine (PERIDEX) 0.12 % solution RINSE 1/2 OZ FOR 30 SECONDS THEN SPIT OUT. DO NOT RINSE WITH WATER AND DO NOT SWALLOW. USE AM AND PM      DENTA 5000 PLUS 1.1 % Crea USE DAY AND NIGHT, BRUSH THOROUGHLY FOR 2 MINS AND SPIT OUT. DO NOT SWALLOW AND RINSE LIGHTLY      magnesium oxide-Mg AA chelate (MAGNESIUM, AMINO ACID CHELATE,) 133 mg Take 1 tablet by mouth daily. 30 tablet 11    metoprolol succinate (TOPROL XL) 25 MG 24 hr tablet Take 1 tablet (25 mg total) by mouth nightly. (Patient taking differently: Take 0.5 tablets (12.5 mg total) by mouth nightly.) 30 tablet 11    MYFORTIC 180 mg EC tablet Take 3 tablets (540 mg total) by mouth Two (2) times a day. 180 tablet 11    tacrolimus (ENVARSUS XR) 1 mg Tb24 extended release tablet Take 3 tablets (3 mg total) by mouth in the morning. 270 tablet 3     No current facility-administered medications for this visit.     Physical Exam  BP 101/66 (BP Site: R Arm, BP Position: Sitting, BP Cuff Size: Small)  - Pulse 72  - Temp 36.4 ??C (97.6 ??F) (Temporal)  - Ht 160 cm (5' 2.99)  - Wt 64 kg (141 lb)  - BMI 24.98 kg/m??     Nursing note and vitals reviewed.   Constitutional: Oriented to person, place, and time. Appears well-developed and well-nourished. No distress.   HENT: wearing mask  Head: Normocephalic and atraumatic.   Eyes: Right eye exhibits no discharge. Left eye exhibits no  discharge. No scleral icterus.   Neck: Normal range of motion. Neck supple.   Cardiovascular: Normal rate and regular rhythm. Exam reveals no gallop and no friction rub. No murmur heard.   Pulmonary/Chest: Effort normal and breath sounds normal. No respiratory distress.   Abdominal: Soft. Non tender  Musculoskeletal: Normal range of motion. No edema and no tenderness.   Neurological: Alert and oriented to person, place, and time.       Laboratory Data and Imaging reviewed in EPIC    Assessment: [name] is a 61 y.o. 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       Recommendations/Plan:   Allograft Function: renal function stable at 0.71 with a baseline of approximately 0.7-0.9 since transplant. Will continue to monitor.  UPC remains undetectable. No DSAs    Immunosuppression Management [High Risk Medical Decision Making For Drug  Therapy Requiring Intensive Monitoring For Toxicity]:  Continue Envarsus 3 mg daily. Targeting tacrolimus trough level of approximately 5-7.  Continue Myfortic 540 mg BID.   Tacrolimus, Trough (ng/mL)   Date Value   07/27/2022 9.3   06/30/2022 8.8   05/27/2022 9.6        Blood Pressure Management: return in 3 months to follow up blood pressure and HR--consider stopping metoprolol    Pre-diabetes: . Home BG overall controlled. Last A1c was 5.7 on 06/30/22. No new interventions    Hypercalcemia: Last Ca normal at 9.9 on 8/23  - s/p parathyroid surgery - 11/11/2019    Gout: no flare or activity since ~2016. He has been off of allopurinol.    Lower Back Pain with midline radiculopathy: Known underlying degenerative changes. Resolved per patient.    Health Maintenance:   - Colonoscopy due 2029  - Blood work once a month    Immunizations:  Immunization History   Administered Date(s) Administered    COVID-19 VACCINE,MRNA(MODERNA)(PF) 04/12/2020, 05/10/2020, 08/15/2020, 03/17/2021    INFLUENZA INJ MDCK PF, QUAD,(FLUCELVAX)(70MO AND UP EGG FREE) 09/04/2017, 08/31/2020, 08/30/2021    INFLUENZA TIV (TRI) PF (IM) 08/17/2011, 08/08/2012, 08/05/2013, 08/28/2014, 08/11/2015, 08/29/2015    Influenza Vaccine Quad (IIV4 PF) 72mo+ injectable 08/16/2015, 09/06/2016, 09/11/2017, 08/06/2019    Influenza Virus Vaccine, unspecified formulation 08/30/2016, 09/18/2017, 09/04/2018, 10/02/2018    PNEUMOCOCCAL POLYSACCHARIDE 23 07/06/2015    PPD Test 09/08/2011, 09/07/2012, 09/05/2013, 09/09/2014, 09/07/2015, 08/10/2016, 09/11/2017, 09/05/2018, 01/03/2019    Pneumococcal Conjugate 13-Valent 07/24/2010, 07/15/2015    Pneumococcal Conjugate 20-valent 08/30/2021    TdaP 12/31/2019         Follow-Up: Return to clinic in 3 months.      Clelia Croft, DO  Division of Nephrology and Hypertension  [facility]  07/28/2022  2:21 PM

## 2022-07-29 DIAGNOSIS — Z940 Kidney transplant status: Principal | ICD-10-CM

## 2022-08-02 LAB — BK VIRUS QUANTITATIVE PCR, BLOOD: BK BLOOD RESULT: NOT DETECTED

## 2022-08-05 LAB — HLA DS POST TRANSPLANT
ANTI-DONOR DRW #1 MFI: 0 MFI
ANTI-DONOR DRW #2 MFI: 6 MFI
ANTI-DONOR HLA-A #2 MFI: 0 MFI
ANTI-DONOR HLA-B #1 MFI: 0 MFI
ANTI-DONOR HLA-B #2 MFI: 0 MFI
ANTI-DONOR HLA-C #1 MFI: 0 MFI
ANTI-DONOR HLA-C #2 MFI: 0 MFI
ANTI-DONOR HLA-DQB #1 MFI: 0 MFI
ANTI-DONOR HLA-DQB #2 MFI: 13 MFI
ANTI-DONOR HLA-DR #1 MFI: 0 MFI
ANTI-DONOR HLA-DR #2 MFI: 0 MFI

## 2022-08-05 LAB — FSAB CLASS 1 ANTIBODY SPECIFICITY: HLA CLASS 1 ANTIBODY RESULT: NEGATIVE

## 2022-08-05 LAB — FSAB CLASS 2 ANTIBODY SPECIFICITY: HLA CL2 AB RESULT: NEGATIVE

## 2022-08-09 DIAGNOSIS — D849 Immunodeficiency, unspecified: Principal | ICD-10-CM

## 2022-08-09 DIAGNOSIS — Z940 Kidney transplant status: Principal | ICD-10-CM

## 2022-08-09 MED ORDER — ENVARSUS XR 1 MG TABLET,EXTENDED RELEASE
ORAL_TABLET | Freq: Every day | ORAL | 3 refills | 90 days | Status: CN
Start: 2022-08-09 — End: 2023-08-09

## 2022-08-09 MED ORDER — METOPROLOL SUCCINATE ER 25 MG TABLET,EXTENDED RELEASE 24 HR
ORAL_TABLET | Freq: Every evening | ORAL | 11 refills | 30.00000 days | Status: CN
Start: 2022-08-09 — End: 2023-08-09
  Filled 2022-08-19: qty 15, 30d supply, fill #0

## 2022-08-09 MED ORDER — TACROLIMUS XR 0.75 MG TABLET,EXTENDED RELEASE 24 HR
ORAL_TABLET | 11 refills | 0 days | Status: CP
Start: 2022-08-09 — End: ?

## 2022-08-09 MED ORDER — TACROLIMUS XR 1 MG TABLET,EXTENDED RELEASE 24 HR
ORAL_TABLET | 3 refills | 0 days | Status: CP
Start: 2022-08-09 — End: ?
  Filled 2022-08-11: qty 60, 30d supply, fill #0

## 2022-08-09 NOTE — Unmapped (Addendum)
[facility] Refill Coordination Note    Specialty Medication(s) to be Shipped:   Transplant: Envarsus 1mg  and Envarsus 0.75mg     Other medication(s) to be shipped:  metoprolol     Louis Dennis, DOB: July 03, 1961  Phone: [phone] (home)       All above HIPAA information was verified with patient.     Was a Nurse, learning disability used for this call? No    Completed refill call assessment today to schedule patient's medication shipment from the [facility] [phone]).  All relevant notes have been reviewed.     Specialty medication(s) and dose(s) confirmed: Patient reports changes to the regimen as follows: pt states envarsus decreased to 2.5mg     Changes to medications:  decreased dose on Metoprolol 12.5mg   Changes to insurance: No  New side effects reported not previously addressed with a pharmacist or physician: None reported  Questions for the pharmacist: No    Confirmed patient received a Conservation officer, historic buildings and a Surveyor, mining with first shipment. The patient will receive a drug information handout for each medication shipped and additional FDA Medication Guides as required.       DISEASE/MEDICATION-SPECIFIC INFORMATION        N/A    SPECIALTY MEDICATION ADHERENCE     Medication Adherence    Patient reported X missed doses in the last month: 0  Specialty Medication: envarus 1mg   Patient is on additional specialty medications: No  Patient is on more than two specialty medications: No  Any gaps in refill history greater than 2 weeks in the last 3 months: no  Demonstrates understanding of importance of adherence: yes  Informant: patient  Reliability of informant: reliable  Provider-estimated medication adherence level: good  Patient is at risk for Non-Adherence: No  Reasons for non-adherence: no problems identified                  Confirmed plan for next specialty medication refill: delivery by pharmacy  Refills needed for supportive medications: yes, ordered or provider notified          Refill Coordination    Has the Patients' Contact Information Changed: No  Is the Shipping Address Different: No         Were doses missed due to medication being on hold? No    envarsus 1 mg: 14 days of medicine on hand   Envarsus Xr 0.75mg : 0 days of medicine on hand.    REFERRAL TO PHARMACIST     Referral to the pharmacist: Not needed      [facility]     Shipping address confirmed in Epic.     Delivery Scheduled: Yes, Expected medication delivery date: 09/07.  However, Rx request for refills was sent to the provider as there are none remaining.     Medication will be delivered via Same Day Courier to the prescription address in Epic WAM.    Antonietta Barcelona   [facility] Specialty Technician

## 2022-09-05 DIAGNOSIS — D849 Immunodeficiency, unspecified: Principal | ICD-10-CM

## 2022-09-05 DIAGNOSIS — Z940 Kidney transplant status: Principal | ICD-10-CM

## 2022-09-05 MED ORDER — MG-PLUS-PROTEIN 133 MG TABLET
ORAL_TABLET | Freq: Every day | ORAL | 11 refills | 30.00000 days
Start: 2022-09-05 — End: 2023-09-05

## 2022-09-05 NOTE — Unmapped (Signed)
[facility] Refill Coordination Note    Specialty Medication(s) to be Shipped:   Transplant: Envarsus XR 0.75mg , Envarsus XR 1mg  and Myfortic 180mg     Other medication(s) to be shipped: magnesium (amino acid chelate) 133 mg      Louis Dennis, DOB: 09-08-1961  Phone: [phone] (home)       All above HIPAA information was verified with patient.     Was a Nurse, learning disability used for this call? No    Completed refill call assessment today to schedule patient's medication shipment from the [facility] [phone]).  All relevant notes have been reviewed.     Specialty medication(s) and dose(s) confirmed: Regimen is correct and unchanged.   Changes to medications: Caron reports no changes at this time.  Changes to insurance: No  New side effects reported not previously addressed with a pharmacist or physician: None reported  Questions for the pharmacist: No    Confirmed patient received a Conservation officer, historic buildings and a Surveyor, mining with first shipment. The patient will receive a drug information handout for each medication shipped and additional FDA Medication Guides as required.       DISEASE/MEDICATION-SPECIFIC INFORMATION        N/A    SPECIALTY MEDICATION ADHERENCE     Medication Adherence    Patient reported X missed doses in the last month: 0  Specialty Medication: Envarsus 0.75 mg  Patient is on additional specialty medications: Yes  Additional Specialty Medications: Envarsus 1 mg   Patient Reported Additional Medication X Missed Doses in the Last Month: 0  Patient is on more than two specialty medications: Yes  Specialty Medication: Myfortic 180 mg  Patient Reported Additional Medication X Missed Doses in the Last Month: 0                          Were doses missed due to medication being on hold? No    Envarsus XR 0.75 mg: 3 days of medicine on hand   Envarsus XR 1 mg: 3 days of medicine on hand   Myfortic 180 mg: 3 days of medicine on hand        REFERRAL TO PHARMACIST Referral to the pharmacist: Not needed      [facility]     Shipping address confirmed in Epic.     Delivery Scheduled: Yes, Expected medication delivery date: 09/06/22.     Medication will be delivered via Same Day Courier to the prescription address in Epic WAM.    Willette Pa   [facility] Specialty Technician

## 2022-09-06 ENCOUNTER — Ambulatory Visit: Admit: 2022-09-06 | Discharge: 2022-09-07 | Payer: MEDICARE

## 2022-09-06 DIAGNOSIS — D849 Immunodeficiency, unspecified: Principal | ICD-10-CM

## 2022-09-06 DIAGNOSIS — Z940 Kidney transplant status: Principal | ICD-10-CM

## 2022-09-06 LAB — BASIC METABOLIC PANEL
ANION GAP: 7 mmol/L (ref 5–14)
BLOOD UREA NITROGEN: 20 mg/dL (ref 9–23)
BUN / CREAT RATIO: 27
CALCIUM: 9.7 mg/dL (ref 8.7–10.4)
CHLORIDE: 109 mmol/L — ABNORMAL HIGH (ref 98–107)
CO2: 26.4 mmol/L (ref 20.0–31.0)
CREATININE: 0.75 mg/dL
EGFR CKD-EPI (2021) MALE: >90 mL/min/{1.73_m2} (ref >=60)
GLUCOSE RANDOM: 140 mg/dL (ref 70–179)
POTASSIUM: 4.6 mmol/L (ref 3.4–4.8)
SODIUM: 142 mmol/L (ref 135–145)

## 2022-09-06 LAB — CBC W/ AUTO DIFF
BASOPHILS ABSOLUTE COUNT: 0 10*9/L (ref 0.0–0.1)
BASOPHILS RELATIVE PERCENT: 0.9 %
EOSINOPHILS ABSOLUTE COUNT: 0.2 10*9/L (ref 0.0–0.5)
EOSINOPHILS RELATIVE PERCENT: 5.5 %
HEMATOCRIT: 43.5 % (ref 39.0–48.0)
HEMOGLOBIN: 14.3 g/dL (ref 12.9–16.5)
LYMPHOCYTES ABSOLUTE COUNT: 0.6 10*9/L — ABNORMAL LOW (ref 1.1–3.6)
LYMPHOCYTES RELATIVE PERCENT: 14.1 %
MEAN CORPUSCULAR HEMOGLOBIN CONC: 32.9 g/dL (ref 32.0–36.0)
MEAN CORPUSCULAR HEMOGLOBIN: 30.2 pg (ref 25.9–32.4)
MEAN CORPUSCULAR VOLUME: 91.8 fL (ref 77.6–95.7)
MEAN PLATELET VOLUME: 8.8 fL (ref 6.8–10.7)
MONOCYTES ABSOLUTE COUNT: 0.4 10*9/L (ref 0.3–0.8)
MONOCYTES RELATIVE PERCENT: 8.8 %
NEUTROPHILS ABSOLUTE COUNT: 2.9 10*9/L (ref 1.8–7.8)
NEUTROPHILS RELATIVE PERCENT: 70.7 %
NUCLEATED RED BLOOD CELLS: 0 /100{WBCs} (ref <=4)
PLATELET COUNT: 169 10*9/L (ref 150–450)
RED BLOOD CELL COUNT: 4.74 10*12/L (ref 4.26–5.60)
RED CELL DISTRIBUTION WIDTH: 14.7 % (ref 12.2–15.2)
WBC ADJUSTED: 4.2 10*9/L (ref 3.6–11.2)

## 2022-09-06 LAB — TACROLIMUS LEVEL, TROUGH: TACROLIMUS, TROUGH: 13.2 ng/mL (ref 5.0–15.0)

## 2022-09-06 LAB — MAGNESIUM: MAGNESIUM: 1.9 mg/dL (ref 1.6–2.6)

## 2022-09-06 LAB — PHOSPHORUS: PHOSPHORUS: 3.1 mg/dL (ref 2.4–5.1)

## 2022-09-06 MED FILL — ENVARSUS XR 1 MG TABLET,EXTENDED RELEASE: ORAL | 30 days supply | Qty: 30 | Fill #0

## 2022-09-06 MED FILL — MYFORTIC 180 MG TABLET,DELAYED RELEASE: ORAL | 30 days supply | Qty: 180 | Fill #7

## 2022-09-06 MED FILL — ENVARSUS XR 0.75 MG TABLET,EXTENDED RELEASE: ORAL | 30 days supply | Qty: 60 | Fill #1

## 2022-09-07 ENCOUNTER — Ambulatory Visit
Admit: 2022-09-07 | Discharge: 2022-09-08 | Payer: MEDICARE | Attending: Student in an Organized Health Care Education/Training Program | Primary: Student in an Organized Health Care Education/Training Program

## 2022-09-07 DIAGNOSIS — Z940 Kidney transplant status: Principal | ICD-10-CM

## 2022-09-07 DIAGNOSIS — D849 Immunodeficiency, unspecified: Principal | ICD-10-CM

## 2022-09-07 MED ORDER — MG-PLUS-PROTEIN 133 MG TABLET
ORAL_TABLET | Freq: Every day | ORAL | 11 refills | 30 days | Status: CP
Start: 2022-09-07 — End: 2023-09-07
  Filled 2022-09-07: qty 30, 30d supply, fill #0

## 2022-09-07 MED ORDER — TACROLIMUS XR 0.75 MG TABLET,EXTENDED RELEASE 24 HR
ORAL_TABLET | Freq: Every day | ORAL | 11 refills | 30 days | Status: CP
Start: 2022-09-07 — End: 2023-09-07

## 2022-09-07 NOTE — Unmapped (Signed)
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 Georgeann Oppenheim    Extended Ophthalmoscopy:  20D Lens  Retinal Periphery - right:  Small area of hyperpigmentation superiorly, lattice inferiorly. No tears, holes, or breaks on SDE 360  Retinal Periphery - left:  Normal

## 2022-09-07 NOTE — Unmapped (Signed)
Called patient regarding Tacrolimus trough of 13.2.  Per Dr Gwynneth Munson, patient to take 1 mg Envarsus x 1 day then 1.5 mg Envarsus daily and repeat labs in one week.  Patient verbalized understanding of this and has no further questions.

## 2022-09-07 NOTE — Unmapped (Addendum)
[facility] Pharmacist has reviewed a new prescription for envarsus that indicates a dose decrease.  Patient was counseled on this dosage change by coordinator AS- see epic note from 10/4.  Next refill call date adjusted if necessary.      Clinical Assessment Needed For: Dose Change  Medication: Envarsus XR 0.75mg  tablet  Last Fill Date/Day Supply: 09/06/2022 / 30 days  Copay $0  Was previous dose already scheduled to fill: No    Notes to Pharmacist: N/A

## 2022-09-11 NOTE — Unmapped (Signed)
I saw and evaluated the patient, participating in the key portions of the service.  I reviewed the resident???s note. I agree with the resident???s findings and plan. I provided the required supervision for all services billed.   Kamden Reber , MD

## 2022-09-15 ENCOUNTER — Ambulatory Visit: Admit: 2022-09-15 | Discharge: 2022-09-16 | Payer: MEDICARE

## 2022-09-15 LAB — CBC W/ AUTO DIFF
BASOPHILS ABSOLUTE COUNT: 0 10*9/L (ref 0.0–0.1)
BASOPHILS RELATIVE PERCENT: 0.9 %
EOSINOPHILS ABSOLUTE COUNT: 0.3 10*9/L (ref 0.0–0.5)
EOSINOPHILS RELATIVE PERCENT: 6.4 %
HEMATOCRIT: 43.3 % (ref 39.0–48.0)
HEMOGLOBIN: 14.2 g/dL (ref 12.9–16.5)
LYMPHOCYTES ABSOLUTE COUNT: 0.5 10*9/L — ABNORMAL LOW (ref 1.1–3.6)
LYMPHOCYTES RELATIVE PERCENT: 11.8 %
MEAN CORPUSCULAR HEMOGLOBIN CONC: 32.9 g/dL (ref 32.0–36.0)
MEAN CORPUSCULAR HEMOGLOBIN: 30.3 pg (ref 25.9–32.4)
MEAN CORPUSCULAR VOLUME: 92.1 fL (ref 77.6–95.7)
MEAN PLATELET VOLUME: 8.9 fL (ref 6.8–10.7)
MONOCYTES ABSOLUTE COUNT: 0.4 10*9/L (ref 0.3–0.8)
MONOCYTES RELATIVE PERCENT: 9.2 %
NEUTROPHILS ABSOLUTE COUNT: 3.2 10*9/L (ref 1.8–7.8)
NEUTROPHILS RELATIVE PERCENT: 71.7 %
NUCLEATED RED BLOOD CELLS: 0 /100{WBCs} (ref <=4)
PLATELET COUNT: 173 10*9/L (ref 150–450)
RED BLOOD CELL COUNT: 4.7 10*12/L (ref 4.26–5.60)
RED CELL DISTRIBUTION WIDTH: 14.8 % (ref 12.2–15.2)
WBC ADJUSTED: 4.5 10*9/L (ref 3.6–11.2)

## 2022-09-15 LAB — MAGNESIUM: MAGNESIUM: 1.9 mg/dL (ref 1.6–2.6)

## 2022-09-15 LAB — BASIC METABOLIC PANEL
ANION GAP: 7 mmol/L (ref 5–14)
BLOOD UREA NITROGEN: 21 mg/dL (ref 9–23)
BUN / CREAT RATIO: 30
CALCIUM: 9.7 mg/dL (ref 8.7–10.4)
CHLORIDE: 108 mmol/L — ABNORMAL HIGH (ref 98–107)
CO2: 26.4 mmol/L (ref 20.0–31.0)
CREATININE: 0.69 mg/dL
EGFR CKD-EPI (2021) MALE: >90 mL/min/{1.73_m2} (ref >=60)
GLUCOSE RANDOM: 136 mg/dL (ref 70–179)
POTASSIUM: 4.3 mmol/L (ref 3.4–4.8)
SODIUM: 141 mmol/L (ref 135–145)

## 2022-09-15 LAB — PHOSPHORUS: PHOSPHORUS: 2.7 mg/dL (ref 2.4–5.1)

## 2022-09-15 LAB — TACROLIMUS LEVEL, TROUGH: TACROLIMUS, TROUGH: 5.9 ng/mL (ref 5.0–15.0)

## 2022-09-28 ENCOUNTER — Ambulatory Visit: Admit: 2022-09-28 | Discharge: 2022-09-29 | Payer: MEDICARE

## 2022-09-28 LAB — CBC W/ AUTO DIFF
BASOPHILS ABSOLUTE COUNT: 0 10*9/L (ref 0.0–0.1)
BASOPHILS RELATIVE PERCENT: 0.6 %
EOSINOPHILS ABSOLUTE COUNT: 0.4 10*9/L (ref 0.0–0.5)
EOSINOPHILS RELATIVE PERCENT: 8.2 %
HEMATOCRIT: 45.1 % (ref 39.0–48.0)
HEMOGLOBIN: 14.7 g/dL (ref 12.9–16.5)
LYMPHOCYTES ABSOLUTE COUNT: 0.5 10*9/L — ABNORMAL LOW (ref 1.1–3.6)
LYMPHOCYTES RELATIVE PERCENT: 11.4 %
MEAN CORPUSCULAR HEMOGLOBIN CONC: 32.6 g/dL (ref 32.0–36.0)
MEAN CORPUSCULAR HEMOGLOBIN: 30.4 pg (ref 25.9–32.4)
MEAN CORPUSCULAR VOLUME: 93.3 fL (ref 77.6–95.7)
MEAN PLATELET VOLUME: 9 fL (ref 6.8–10.7)
MONOCYTES ABSOLUTE COUNT: 0.3 10*9/L (ref 0.3–0.8)
MONOCYTES RELATIVE PERCENT: 6.8 %
NEUTROPHILS ABSOLUTE COUNT: 3.3 10*9/L (ref 1.8–7.8)
NEUTROPHILS RELATIVE PERCENT: 73 %
NUCLEATED RED BLOOD CELLS: 0 /100{WBCs} (ref <=4)
PLATELET COUNT: 168 10*9/L (ref 150–450)
RED BLOOD CELL COUNT: 4.84 10*12/L (ref 4.26–5.60)
RED CELL DISTRIBUTION WIDTH: 15 % (ref 12.2–15.2)
WBC ADJUSTED: 4.6 10*9/L (ref 3.6–11.2)

## 2022-09-28 LAB — BASIC METABOLIC PANEL
ANION GAP: 10 mmol/L (ref 5–14)
BLOOD UREA NITROGEN: 11 mg/dL (ref 9–23)
BUN / CREAT RATIO: 16
CALCIUM: 9.4 mg/dL (ref 8.7–10.4)
CHLORIDE: 109 mmol/L — ABNORMAL HIGH (ref 98–107)
CO2: 24.2 mmol/L (ref 20.0–31.0)
CREATININE: 0.67 mg/dL — ABNORMAL LOW
EGFR CKD-EPI (2021) MALE: >90 mL/min/{1.73_m2} (ref >=60)
GLUCOSE RANDOM: 143 mg/dL (ref 70–179)
POTASSIUM: 4.2 mmol/L (ref 3.4–4.8)
SODIUM: 143 mmol/L (ref 135–145)

## 2022-09-28 LAB — MAGNESIUM: MAGNESIUM: 2.1 mg/dL (ref 1.6–2.6)

## 2022-09-28 LAB — TACROLIMUS LEVEL, TROUGH: TACROLIMUS, TROUGH: 4.8 ng/mL — ABNORMAL LOW (ref 5.0–15.0)

## 2022-09-28 LAB — PHOSPHORUS: PHOSPHORUS: 2.6 mg/dL (ref 2.4–5.1)

## 2022-09-29 NOTE — Unmapped (Signed)
[facility] Refill Coordination Note    Specialty Medication(s) to be Shipped:   Transplant: Envarsus 0.75mg  and Myfortic 180mg     Other medication(s) to be shipped:  Mag, metoprolol     [name], DOB: March 20, 1961  Phone: [phone] (home)       All above HIPAA information was verified with patient.     Was a Nurse, learning disability used for this call? No    Completed refill call assessment today to schedule patient's medication shipment from the [facility] [phone]).  All relevant notes have been reviewed.     Specialty medication(s) and dose(s) confirmed: Regimen is correct and unchanged.   Changes to medications: Marck reports no changes at this time.  Changes to insurance: No  New side effects reported not previously addressed with a pharmacist or physician: None reported  Questions for the pharmacist: No    Confirmed patient received a Conservation officer, historic buildings and a Surveyor, mining with first shipment. The patient will receive a drug information handout for each medication shipped and additional FDA Medication Guides as required.       DISEASE/MEDICATION-SPECIFIC INFORMATION        N/A    SPECIALTY MEDICATION ADHERENCE     Medication Adherence    Patient reported X missed doses in the last month: 0  Specialty Medication: envarsus 1 mg  Patient is on additional specialty medications: Yes  Additional Specialty Medications: Myfortic 180 mg   Patient Reported Additional Medication X Missed Doses in the Last Month: 0  Patient is on more than two specialty medications: Yes  Specialty Medication: envarsus 0.75 mg  Patient Reported Additional Medication X Missed Doses in the Last Month: 0                         Envarsus 0.75mg  7 days worth of medication on hand.  Myfortic 180mg   7 days worth of medication on hand.        Were doses missed due to medication being on hold? No      REFERRAL TO PHARMACIST     Referral to the pharmacist: Not needed      [facility]     Shipping address confirmed in Epic.     Delivery Scheduled: Yes, Expected medication delivery date: 10/04/22.     Medication will be delivered via Next Day Courier to the prescription address in Epic WAM.    [place] A Minnie Shi   [facility] Specialty Technician/

## 2022-09-30 DIAGNOSIS — Z940 Kidney transplant status: Principal | ICD-10-CM

## 2022-09-30 MED ORDER — TACROLIMUS XR 1 MG TABLET,EXTENDED RELEASE 24 HR
ORAL_TABLET | Freq: Every day | ORAL | 11 refills | 30 days | Status: CP
Start: 2022-09-30 — End: 2023-09-30
  Filled 2022-10-03: qty 60, 30d supply, fill #0

## 2022-09-30 NOTE — Unmapped (Signed)
Called patient regarding Tacrolimus level of 4.8.  Per Dr Gwynneth Munson, patient to increase dose to 2 mg. Patient verbalized understanding of this plan and still has some 1 mg tablets left.  He will start taking those.  New order sent to [facility].

## 2022-09-30 NOTE — Unmapped (Addendum)
[facility] Pharmacist has reviewed a new prescription for envarsus  that indicates a dose decrease.  Patient was counseled on this dosage change by coordinator AS- see epic note from 10/27.  Next refill call date adjusted if necessary.        Clinical Assessment Needed For: Dose Change  Medication: Envarsus XR 1mg  tablet  Last Fill Date/Day Supply: 09/06/2022 / 30 days  Copay $0  Was previous dose already scheduled to fill: Yes    Notes to Pharmacist: 0.75mg  tablets are scheduled for 10/30, but have been discontinued

## 2022-09-30 NOTE — Unmapped (Signed)
Added Envarsus 1 mg to this pts next delivery NRS

## 2022-10-03 MED FILL — MYFORTIC 180 MG TABLET,DELAYED RELEASE: ORAL | 30 days supply | Qty: 180 | Fill #8

## 2022-10-03 MED FILL — METOPROLOL SUCCINATE ER 25 MG TABLET,EXTENDED RELEASE 24 HR: ORAL | 30 days supply | Qty: 15 | Fill #1

## 2022-10-03 MED FILL — MG-PLUS-PROTEIN 133 MG TABLET: ORAL | 30 days supply | Qty: 30 | Fill #1

## 2022-10-10 ENCOUNTER — Ambulatory Visit: Admit: 2022-10-10 | Discharge: 2022-10-11 | Payer: MEDICARE

## 2022-10-10 DIAGNOSIS — Z940 Kidney transplant status: Principal | ICD-10-CM

## 2022-10-11 ENCOUNTER — Ambulatory Visit: Admit: 2022-10-11 | Discharge: 2022-10-12 | Payer: MEDICARE

## 2022-10-11 LAB — BASIC METABOLIC PANEL
ANION GAP: 3 mmol/L — ABNORMAL LOW (ref 5–14)
BLOOD UREA NITROGEN: 21 mg/dL (ref 9–23)
BUN / CREAT RATIO: 32
CALCIUM: 9.3 mg/dL (ref 8.7–10.4)
CHLORIDE: 110 mmol/L — ABNORMAL HIGH (ref 98–107)
CO2: 24.9 mmol/L (ref 20.0–31.0)
CREATININE: 0.66 mg/dL — ABNORMAL LOW
EGFR CKD-EPI (2021) MALE: >90 mL/min/{1.73_m2} (ref >=60)
GLUCOSE RANDOM: 131 mg/dL (ref 70–179)
POTASSIUM: 4.4 mmol/L (ref 3.4–4.8)
SODIUM: 138 mmol/L (ref 135–145)

## 2022-10-11 LAB — URINALYSIS WITH MICROSCOPY WITH CULTURE REFLEX
BACTERIA: NONE SEEN /HPF
BILIRUBIN UA: NEGATIVE
BLOOD UA: NEGATIVE
GLUCOSE UA: NEGATIVE
KETONES UA: NEGATIVE
LEUKOCYTE ESTERASE UA: NEGATIVE
NITRITE UA: NEGATIVE
PH UA: 5 (ref 5.0–9.0)
PROTEIN UA: NEGATIVE
RBC UA: <1 /HPF (ref <=3)
SPECIFIC GRAVITY UA: 1.014 (ref 1.003–1.030)
SQUAMOUS EPITHELIAL: <1 /HPF (ref 0–5)
UROBILINOGEN UA: <2
WBC UA: <1 /HPF (ref <=2)

## 2022-10-11 LAB — CBC W/ AUTO DIFF
BASOPHILS ABSOLUTE COUNT: 0 10*9/L (ref 0.0–0.1)
BASOPHILS RELATIVE PERCENT: 1 %
EOSINOPHILS ABSOLUTE COUNT: 0.4 10*9/L (ref 0.0–0.5)
EOSINOPHILS RELATIVE PERCENT: 8.2 %
HEMATOCRIT: 43.3 % (ref 39.0–48.0)
HEMOGLOBIN: 14.2 g/dL (ref 12.9–16.5)
LYMPHOCYTES ABSOLUTE COUNT: 0.6 10*9/L — ABNORMAL LOW (ref 1.1–3.6)
LYMPHOCYTES RELATIVE PERCENT: 12.5 %
MEAN CORPUSCULAR HEMOGLOBIN CONC: 32.9 g/dL (ref 32.0–36.0)
MEAN CORPUSCULAR HEMOGLOBIN: 30.4 pg (ref 25.9–32.4)
MEAN CORPUSCULAR VOLUME: 92.4 fL (ref 77.6–95.7)
MEAN PLATELET VOLUME: 8.7 fL (ref 6.8–10.7)
MONOCYTES ABSOLUTE COUNT: 0.4 10*9/L (ref 0.3–0.8)
MONOCYTES RELATIVE PERCENT: 8.4 %
NEUTROPHILS ABSOLUTE COUNT: 3.2 10*9/L (ref 1.8–7.8)
NEUTROPHILS RELATIVE PERCENT: 69.9 %
NUCLEATED RED BLOOD CELLS: 0 /100{WBCs} (ref <=4)
PLATELET COUNT: 175 10*9/L (ref 150–450)
RED BLOOD CELL COUNT: 4.68 10*12/L (ref 4.26–5.60)
RED CELL DISTRIBUTION WIDTH: 14.9 % (ref 12.2–15.2)
WBC ADJUSTED: 4.6 10*9/L (ref 3.6–11.2)

## 2022-10-11 LAB — PHOSPHORUS: PHOSPHORUS: 2.8 mg/dL (ref 2.4–5.1)

## 2022-10-11 LAB — TACROLIMUS LEVEL, TROUGH: TACROLIMUS, TROUGH: 5.3 ng/mL (ref 5.0–15.0)

## 2022-10-11 LAB — MAGNESIUM: MAGNESIUM: 2 mg/dL (ref 1.6–2.6)

## 2022-10-13 NOTE — Unmapped (Signed)
Left a detailed message stating pt's tacrolimus range was in an acceptable range and to keep with current tacrolimus dose.

## 2022-10-24 NOTE — Unmapped (Signed)
[facility] Refill Coordination Note    Specialty Medication(s) to be Shipped:   Transplant: Envarsus 1mg  and Myfortic 180mg     Other medication(s) to be shipped:  metoprolol and mag-ox     Louis Dennis, DOB: 1961/02/27  Phone: [phone] (home)       All above HIPAA information was verified with patient.     Was a Nurse, learning disability used for this call? No    Completed refill call assessment today to schedule patient's medication shipment from the [facility] [phone]).  All relevant notes have been reviewed.     Specialty medication(s) and dose(s) confirmed: Regimen is correct and unchanged.   Changes to medications: [place] reports no changes at this time.  Changes to insurance: No  New side effects reported not previously addressed with a pharmacist or physician: None reported  Questions for the pharmacist: No    Confirmed patient received a Conservation officer, historic buildings and a Surveyor, mining with first shipment. The patient will receive a drug information handout for each medication shipped and additional FDA Medication Guides as required.       DISEASE/MEDICATION-SPECIFIC INFORMATION        N/A    SPECIALTY MEDICATION ADHERENCE     Medication Adherence    Patient reported X missed doses in the last month: 0  Specialty Medication: ENVARSUS XR 1 mg Tb24 extended release tablet (tacrolimus)  Patient is on additional specialty medications: Yes  Additional Specialty Medications: MYFORTIC 180 MG EC tablet (mycophenolate)  Patient Reported Additional Medication X Missed Doses in the Last Month: 0  Patient is on more than two specialty medications: No                                Were doses missed due to medication being on hold? No    Myfortic 180 mg: 10 days of medicine on hand   envarsus 1 mg: 10 days of medicine on hand       REFERRAL TO PHARMACIST     Referral to the pharmacist: Not needed      [facility]     Shipping address confirmed in Epic.     Delivery Scheduled: Yes, Expected medication delivery date: 11/01/22.     Medication will be delivered via Next Day Courier to the prescription address in Epic WAM.    [name]   [facility] Specialty Technician

## 2022-10-31 MED FILL — MYFORTIC 180 MG TABLET,DELAYED RELEASE: ORAL | 30 days supply | Qty: 180 | Fill #9

## 2022-10-31 MED FILL — METOPROLOL SUCCINATE ER 25 MG TABLET,EXTENDED RELEASE 24 HR: ORAL | 30 days supply | Qty: 15 | Fill #2

## 2022-10-31 MED FILL — MG-PLUS-PROTEIN 133 MG TABLET: ORAL | 30 days supply | Qty: 30 | Fill #2

## 2022-10-31 MED FILL — ENVARSUS XR 1 MG TABLET,EXTENDED RELEASE: ORAL | 30 days supply | Qty: 60 | Fill #1

## 2022-11-07 ENCOUNTER — Ambulatory Visit: Admit: 2022-11-07 | Discharge: 2022-11-08 | Payer: MEDICARE | Attending: Family | Primary: Family

## 2022-11-07 DIAGNOSIS — Z87891 Personal history of nicotine dependence: Principal | ICD-10-CM

## 2022-11-07 DIAGNOSIS — Z122 Encounter for screening for malignant neoplasm of respiratory organs: Principal | ICD-10-CM

## 2022-11-07 DIAGNOSIS — R7303 Prediabetes: Principal | ICD-10-CM

## 2022-11-07 DIAGNOSIS — Z125 Encounter for screening for malignant neoplasm of prostate: Principal | ICD-10-CM

## 2022-11-07 NOTE — Unmapped (Signed)
FYI Patient declined interpreter services.

## 2022-11-07 NOTE — Unmapped (Addendum)
Ask nephrology about covid vaccine booster.     Remind lab to draw PSA with other labs ordered by your specialist. PSA ordered by your primary care doctor.

## 2022-11-07 NOTE — Unmapped (Signed)
Assessment and Plan:      Diagnosis ICD-10-CM Associated Orders   1. Pre-diabetes  R73.03 HGB A1c 6.5 today, up from 5.8 eight months ago).   DM well controlled.    Encouraged patient to continue carb controlled diet and regular exercise. Continue to monitor home blood sugar values and notify PCP/clinic if values rise above 130-140.               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 Reviewed low sodium diet and encouraged regular exercise. Advised to continue to monitor and log at-home BP readings          3. 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 Defer ongoing management to nephrology.                 4. Screening for prostate cancer  Z12.5 PSA (Prostate Specific Antigen)      5. Former smoker, stopped smoking many years ago  Z87.891 CT Lung Cancer Screening Baseline or Annual Low Dose      6. Screening for lung cancer  Z12.2 CT Lung Cancer Screening Baseline or Annual Low Dose          I personally spent 30 minutes face-to-face and non-face-to-face in the care of this patient, which includes all pre, intra, and post visit time on the date of service.    Return in about 4 months (around 03/09/2023) for Next scheduled follow up.    HPI:      Buford Bedsole is here for   Chief Complaint   Patient presents with    End stage renal disease     Non-fasting.    Renovascular hypertension    Gestational Diabetes     Prediabetes: Patient presents for follow up of prediabetes.  A1C goal is <8.  Current symptoms include: none. Symptoms have been well-controlled. Patient denies foot ulcerations, hyperglycemia, hypoglycemia , increase appetite, nausea, paresthesia of the feet, polydipsia, polyuria, visual disturbances, vomitting and weight loss. Evaluation to date has included: hemoglobin A1C.  Home sugars: Between 90-120,in the morning.  Current treatment: not longer on metformin. Controlled by diet. Following with Nephrology every 4 months for management as well.      Hypertension: Patient presents for follow-up of hypertension. Blood pressure goal < 140/90.  Hypertension has customarily been at goal complicated by renal disease (post transplant).  Home blood pressure readings: 100-110/60's. Salt intake and diet: salt not added to cooking and salt shaker not on table. Associated signs and symptoms: none. Patient denies: blurred vision, chest pain, dyspnea, headache, neck aches, orthopnea, palpitations, paroxysmal nocturnal dyspnea, peripheral edema, pulsating in the ears and tiredness/fatigue. Medication compliance: patient stopped metoprolol. Specialist advised him to reduce it to 12.5 mg and he could stop if he needed to. Off metoprolol x 1 month, dizziness resolved and BP stable. He is doing regular exercise.     Seeing nepho this coming Friday for follow up appointment.       Patient formerly smoked 3/4 ppd for > 40 years. Quit in 2010. Agreeable to low dose CT scan for lung cancer screening.      ROS:      Comprehensive 10 point ROS negative unless otherwise stated in the HPI.      [facility] Components:     Medication adherence and barriers to the treatment plan  have been addressed. Opportunities to optimize healthy behaviors have been discussed. Patient / caregiver voiced understanding.    Past Medical/Surgical History:     Past Medical History:   Diagnosis Date    Abnormal thyroid function test     End stage renal disease (CMS-HCC)     now on peritoneal dialysis    Gout     reports was in Right hand, foot and left elbow    Hypertension     takes meds intermitttently based on readings    Nephrolithiasis 2014    Visual impairment     glasses     Past Surgical History:   Procedure Laterality Date    OTHER SURGICAL HISTORY Right 08/2017    Pt had catheter moved from left side to right side.    pd catheter  2012    PR COLONOSCOPY W/BIOPSY SINGLE/MULTIPLE N/A 06/15/2018    Procedure: COLONOSCOPY, FLEXIBLE, PROXIMAL TO SPLENIC FLEXURE; WITH BIOPSY, SINGLE OR MULTIPLE;  Surgeon: Charm Rings, MD;  Location: [facility];  Service: Gastroenterology    PR COLSC FLX W/RMVL OF TUMOR POLYP LESION SNARE TQ N/A 06/15/2018    Procedure: COLONOSCOPY FLEX; W/REMOV TUMOR/LES BY SNARE;  Surgeon: Charm Rings, MD;  [facility]: [facility];  Service: Gastroenterology    PR EXPLORATORY OF ABDOMEN N/A 11/22/2018    Procedure: EXPLORATORY LAPAROTOMY, EXPLORATORY CELIOTOMY WITH OR WITHOUT BIOPSY(S);  Surgeon: Lawrence Marseilles Day Thurnell Lose, MD;  Location: MAIN OR [facility];  Service: Trauma    PR EXPLORE PARATHYROID GLANDS N/A 11/11/2019    Procedure: RS 22 PARATHYROIDECTOMY OR EXPLORATION OF PARATHYROID(S);  Surgeon: Johny Chess, MD;  Location: MAIN OR [facility];  Service: Surgical Oncology    PR FREEING BOWEL ADHESION,ENTEROLYSIS N/A 11/22/2018    Procedure: Enterolysis (Separt Proc);  Surgeon: Lawrence Marseilles Day Thurnell Lose, MD;  Location: MAIN OR [facility];  Service: Trauma    PR REMOVE PERITONEAL FOREIGN BODY N/A 11/22/2018    Procedure: Removal Of Peritoneal Of Foreign Body From Peritoneal Cavity;  Surgeon: Lawrence Marseilles Day Thurnell Lose, MD;  Location: MAIN OR [facility];  Service: Trauma    PR TRANSPLANT,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    Procedure: [facility] STD PREP CAD DONR RENAL ALLOGFT PRIOR TO TRNSPLNT, INCL DISSEC/REM PERINEPH FAT, DIAPH/RTPER ATTAC;  Surgeon: Doyce Loose, MD;  Location: MAIN OR [place];  Service: Transplant    PR TRANSPLANTATION OF KIDNEY N/A 05/24/2019    Procedure: RENAL ALLOTRANSPLANTATION, IMPLANTATION OF GRAFT; WITHOUT RECIPIENT NEPHRECTOMY;  Surgeon: Doyce Loose, MD;  Location: MAIN OR [place];  Service: Transplant       Family History:     Family History   Problem Relation Age of Onset    Hypertension Mother     Kidney disease Mother     Diabetes Mother     Hypertension Father     Kidney disease Sister         s/p transplant Hypertension Brother     Glaucoma Neg Hx     Amblyopia Neg Hx     Blindness Neg Hx     Retinal detachment Neg Hx     Strabismus Neg Hx     Macular degeneration Neg Hx     Basal cell carcinoma Neg Hx     Melanoma Neg Hx     Squamous cell carcinoma Neg Hx        Social History:     Social History     Tobacco Use    Smoking status:  Former     Current packs/day: 0.00     Average packs/day: 1 pack/day for 30.0 years (30.0 ttl pk-yrs)     Types: Cigarettes     Start date: 07/11/1979     Quit date: 07/10/2009     Years since quitting: 13.3     Passive exposure: Never    Smokeless tobacco: Never   Vaping Use    Vaping Use: Never used   Substance Use Topics    Alcohol use: No     Alcohol/week: 0.0 standard drinks of alcohol    Drug use: No       Allergies:     Patient has no known allergies.    Current Medications:     Current Outpatient Medications   Medication Sig Dispense Refill    chlorhexidine (PERIDEX) 0.12 % solution RINSE 1/2 OZ FOR 30 SECONDS THEN SPIT OUT. DO NOT RINSE WITH WATER AND DO NOT SWALLOW. USE AM AND PM      DENTA 5000 PLUS 1.1 % Crea USE DAY AND NIGHT, BRUSH THOROUGHLY FOR 2 MINS AND SPIT OUT. DO NOT SWALLOW AND RINSE LIGHTLY      magnesium oxide-Mg AA chelate (MAGNESIUM, AMINO ACID CHELATE,) 133 mg Take 1 tablet by mouth daily. 30 tablet 11    metoPROLOL succinate (TOPROL XL) 25 MG 24 hr tablet Take 1/2 tablet (12.5 mg total) by mouth nightly. 15 tablet 11    MYFORTIC 180 mg EC tablet Take 3 tablets (540 mg total) by mouth Two (2) times a day. 180 tablet 11    tacrolimus (ENVARSUS XR) 1 mg Tb24 extended release tablet Take 2 tablets (2 mg total) by mouth in the morning. 60 tablet 11     No current facility-administered medications for this visit.       Health Maintenance:     Health Maintenance   Topic Date Due    Meningococcal B Vaccines (1 of 4 - Increased Risk) Never done    RIS LUNG SCREENING HM TOPIC SHELL  Never done    COVID-19 Vaccine (5 - 2023-24 season) 08/05/2022    Lipid Screening  07/28/2027 Colon Cancer Screening  06/15/2028    DTaP/Tdap/Td Vaccines (2 - Td or Tdap) 12/30/2029    Pneumococcal Vaccine 0-64  Completed    Hepatitis C Screen  Completed    Influenza Vaccine  Completed    Zoster Vaccines  Discontinued       Immunizations:     Immunization History   Administered Date(s) Administered    COVID-19 VACCINE,MRNA(MODERNA)(PF) 04/12/2020, 05/10/2020, 08/15/2020, 03/17/2021    INFLUENZA INJ MDCK PF, QUAD,(FLUCELVAX)(59MO AND UP EGG FREE) 09/04/2017, 08/31/2020, 08/30/2021    INFLUENZA TIV (TRI) PF (IM) 08/17/2011, 08/08/2012, 08/05/2013, 08/28/2014, 08/11/2015, 08/29/2015    Influenza Vaccine Quad (IIV4 PF) 44mo+ injectable 08/16/2015, 09/06/2016, 09/11/2017, 08/06/2019, 11/07/2022    Influenza Virus Vaccine, unspecified formulation 08/30/2016, 09/18/2017, 09/04/2018, 10/02/2018    PNEUMOCOCCAL POLYSACCHARIDE 23-VALENT 07/06/2015    PPD Test 09/08/2011, 09/07/2012, 09/05/2013, 09/09/2014, 09/07/2015, 08/10/2016, 09/11/2017, 09/05/2018, 01/03/2019    Pneumococcal Conjugate 13-Valent 07/24/2010, 07/15/2015    Pneumococcal Conjugate 20-valent 08/30/2021    TdaP 12/31/2019     I have reviewed and (if needed) updated the patient's problem list, medications, allergies, past medical and surgical history, social and family history.     Vital Signs:     Wt Readings from Last 3 Encounters:   11/07/22 64 kg (141 lb)   07/28/22 64 kg (141 lb)   07/08/22 64.4 kg (142 lb)  Temp Readings from Last 3 Encounters:   11/07/22 36.7 ??C (98.1 ??F) (Oral)   07/28/22 36.4 ??C (97.6 ??F) (Temporal)   07/08/22 36.6 ??C (97.9 ??F) (Oral)     BP Readings from Last 3 Encounters:   11/07/22 100/60   07/28/22 101/66   07/08/22 90/60     Pulse Readings from Last 3 Encounters:   11/07/22 89   07/28/22 72   07/08/22 87     Estimated body mass index is 24.98 kg/m?? as calculated from the following:    Height as of this encounter: 160 cm (5' 2.99).    Weight as of this encounter: 64 kg (141 lb).  Facility age limit for growth %iles is 20 years.      Objective:      General: Alert and oriented x3. Well-appearing. No acute distress.   HEENT:  Normocephalic.  Atraumatic. Conjunctiva and sclera normal. OP MMM without lesions.   Neck:  Supple. No thyroid enlargement. No adenopathy.   Heart:  Regular rate and rhythm. Normal S1, S2. No murmurs, rubs or gallops.   Lungs:  No respiratory distress.  Lungs clear to auscultation. No wheezes, rhonchi, or rales.   GI/GU:  Soft, +BS, nondistended, non-TTP. No palpable masses or organomegaly.   Extremities:  No edema. Peripheral pulses normal.   Skin:  Warm, dry. No rash or lesions present.   Neuro:  Non-focal. No obvious weakness.   Psych:  Affect normal, eye contact good, speech clear and coherent.       Noralyn Pick, FNP

## 2022-11-09 ENCOUNTER — Ambulatory Visit: Admit: 2022-11-09 | Discharge: 2022-11-10 | Payer: MEDICARE

## 2022-11-09 LAB — BASIC METABOLIC PANEL
ANION GAP: 7 mmol/L (ref 5–14)
BLOOD UREA NITROGEN: 26 mg/dL — ABNORMAL HIGH (ref 9–23)
BUN / CREAT RATIO: 38
CALCIUM: 9.6 mg/dL (ref 8.7–10.4)
CHLORIDE: 108 mmol/L — ABNORMAL HIGH (ref 98–107)
CO2: 25.3 mmol/L (ref 20.0–31.0)
CREATININE: 0.68 mg/dL — ABNORMAL LOW
EGFR CKD-EPI (2021) MALE: >90 mL/min/{1.73_m2} (ref >=60)
GLUCOSE RANDOM: 118 mg/dL (ref 70–179)
POTASSIUM: 4.7 mmol/L (ref 3.5–5.1)
SODIUM: 140 mmol/L (ref 135–145)

## 2022-11-09 LAB — URINALYSIS WITH MICROSCOPY WITH CULTURE REFLEX
BACTERIA: NONE SEEN /HPF
BILIRUBIN UA: NEGATIVE
BLOOD UA: NEGATIVE
GLUCOSE UA: NEGATIVE
KETONES UA: 10 — AB
LEUKOCYTE ESTERASE UA: NEGATIVE
NITRITE UA: NEGATIVE
PH UA: 5 (ref 5.0–9.0)
PROTEIN UA: NEGATIVE
RBC UA: <1 /HPF (ref <=3)
SPECIFIC GRAVITY UA: 1.02 (ref 1.003–1.030)
SQUAMOUS EPITHELIAL: <1 /HPF (ref 0–5)
UROBILINOGEN UA: <2
WBC UA: 1 /HPF (ref <=2)

## 2022-11-09 LAB — CBC W/ AUTO DIFF
BASOPHILS ABSOLUTE COUNT: 0 10*9/L (ref 0.0–0.1)
BASOPHILS RELATIVE PERCENT: 0.8 %
EOSINOPHILS ABSOLUTE COUNT: 0.3 10*9/L (ref 0.0–0.5)
EOSINOPHILS RELATIVE PERCENT: 8.6 %
HEMATOCRIT: 46.2 % (ref 39.0–48.0)
HEMOGLOBIN: 15 g/dL (ref 12.9–16.5)
LYMPHOCYTES ABSOLUTE COUNT: 0.4 10*9/L — ABNORMAL LOW (ref 1.1–3.6)
LYMPHOCYTES RELATIVE PERCENT: 9.4 %
MEAN CORPUSCULAR HEMOGLOBIN CONC: 32.4 g/dL (ref 32.0–36.0)
MEAN CORPUSCULAR HEMOGLOBIN: 30.2 pg (ref 25.9–32.4)
MEAN CORPUSCULAR VOLUME: 93 fL (ref 77.6–95.7)
MEAN PLATELET VOLUME: 8.4 fL (ref 6.8–10.7)
MONOCYTES ABSOLUTE COUNT: 0.5 10*9/L (ref 0.3–0.8)
MONOCYTES RELATIVE PERCENT: 12.2 %
NEUTROPHILS ABSOLUTE COUNT: 2.7 10*9/L (ref 1.8–7.8)
NEUTROPHILS RELATIVE PERCENT: 69 %
NUCLEATED RED BLOOD CELLS: 0 /100{WBCs} (ref <=4)
PLATELET COUNT: 160 10*9/L (ref 150–450)
RED BLOOD CELL COUNT: 4.96 10*12/L (ref 4.26–5.60)
RED CELL DISTRIBUTION WIDTH: 14.4 % (ref 12.2–15.2)
WBC ADJUSTED: 3.9 10*9/L (ref 3.6–11.2)

## 2022-11-09 LAB — PHOSPHORUS: PHOSPHORUS: 3.4 mg/dL (ref 2.4–5.1)

## 2022-11-09 LAB — PSA: PROSTATE SPECIFIC ANTIGEN: 0.35 ng/mL (ref 0.00–4.00)

## 2022-11-09 LAB — MAGNESIUM: MAGNESIUM: 2 mg/dL (ref 1.6–2.6)

## 2022-11-09 LAB — TACROLIMUS LEVEL, TROUGH: TACROLIMUS, TROUGH: 6.7 ng/mL (ref 5.0–15.0)

## 2022-11-11 ENCOUNTER — Ambulatory Visit: Admit: 2022-11-11 | Discharge: 2022-11-12 | Payer: MEDICARE | Attending: Nephrology | Primary: Nephrology

## 2022-11-11 NOTE — Unmapped (Signed)
Transplant Nephrology Clinic Visit    History of Present Illness  [name] is a 61 y.o. 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  -Adequate perfusion of the transplant kidney. Resistive indices within the mid main renal artery and at the anastomosis are slightly elevated. Attention on follow-up.     Current Immunosuppression Regimen:   - Myfortic 540mg  BID  - Envarsus 2 mg daily    Subjective/Interval:     Saw PCP in interval  BP at home since stopping metoprolol has been 108-110s/70s--continue to hold metoprolol    Has had flu shot with PCP      No hospitalizations, no ED visits in interval since last visit  No chest pain, no shortness of breath at rest, no lower extremity edema, no nausea, no vomiting, no diarrhea, no pain swallowing, no tremors, no fevers, no chills, no skin changes.  No pain urinating, no trouble passing urine, no visible blood in urine.  The patient reports they are able to obtain and take their immunosuppressants without issues.      Review of Systems  Otherwise as per HPI, all other systems reviewed and are negative.    Medications  Current Outpatient Medications   Medication Sig Dispense Refill    chlorhexidine (PERIDEX) 0.12 % solution RINSE 1/2 OZ FOR 30 SECONDS THEN SPIT OUT. DO NOT RINSE WITH WATER AND DO NOT SWALLOW. USE AM AND PM      DENTA 5000 PLUS 1.1 % Crea USE DAY AND NIGHT, BRUSH THOROUGHLY FOR 2 MINS AND SPIT OUT. DO NOT SWALLOW AND RINSE LIGHTLY      magnesium oxide-Mg AA chelate (MAGNESIUM, AMINO ACID CHELATE,) 133 mg Take 1 tablet by mouth daily. 30 tablet 11    metoPROLOL succinate (TOPROL XL) 25 MG 24 hr tablet Take 1/2 tablet (12.5 mg total) by mouth nightly. 15 tablet 11    MYFORTIC 180 mg EC tablet Take 3 tablets (540 mg total) by mouth Two (2) times a day. 180 tablet 11    tacrolimus (ENVARSUS XR) 1 mg Tb24 extended release tablet Take 2 tablets (2 mg total) by mouth in the morning. 60 tablet 11     No current facility-administered medications for this visit.     Physical Exam  BP 112/61 (BP Site: L Arm, BP Position: Sitting, BP Cuff Size: Large)  - Pulse 71  - Temp 36.3 ??C (97.3 ??F)  - Resp 18  - Ht 160 cm (5' 3)  - Wt 64 kg (141 lb)  - BMI 24.98 kg/m??     Nursing note and vitals reviewed.   Constitutional: Oriented to person, place, and time. Appears well-developed and  well-nourished. No distress.   HENT: wearing mask  Head: Normocephalic and atraumatic.   Neck: Normal range of motion. Neck supple.   Cardiovascular: Normal rate and regular rhythm. Exam reveals no gallop and no friction rub. No murmur heard.   Pulmonary/Chest: Effort normal and breath sounds normal. No respiratory distress.   Abdominal: Soft. Non tender  Musculoskeletal: Normal range of motion. No edema and no tenderness.   Neurological: Alert and oriented to person, place, and time.       Laboratory Data and Imaging reviewed in EPIC    Assessment: [name] is a 61 y.o. 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       Recommendations/Plan:   Allograft Function, status post DDKT: renal function stable with Cr 0.68 on 11/09/22 with a baseline of approximately 0.7-0.9 since transplant. Will continue to monitor.  UPC remains undetectable as of Aug 2023. No DSAs    Immunosuppression Management [High Risk Medical Decision Making For Drug  Therapy Requiring Intensive Monitoring For Toxicity]:  Continue Envarsus 2 mg daily. Targeting tacrolimus trough level of approximately 5-7 ng/mL.  Continue Myfortic 540 mg BID.   Tacrolimus, Trough (ng/mL)   Date Value   11/09/2022 6.7   10/11/2022 5.3   09/28/2022 4.8 (L)        Blood Pressure Management: normotensive, continue to hold metoprolol    Pre-diabetes: . Home BG overall controlled. POC HbA1c 6.5--repeat with January labs and consider adding metformin    Hypercalcemia: Last Ca normal at 9.8 on 12/6  - s/p parathyroid surgery - 11/11/2019    Gout: no flare or activity since ~2016. He has been off of allopurinol.    Lower Back Pain with midline radiculopathy: Known underlying degenerative changes. Resolved per patient.    Health Maintenance:   - Colonoscopy due 2029  - Blood work once a month    Immunizations:  Immunization History   Administered Date(s) Administered    COVID-19 VACCINE,MRNA(MODERNA)(PF) 04/12/2020, 05/10/2020, 08/15/2020, 03/17/2021    INFLUENZA INJ MDCK PF, QUAD,(FLUCELVAX)(51MO AND UP EGG FREE) 09/04/2017, 08/31/2020, 08/30/2021    INFLUENZA TIV (TRI) PF (IM) 08/17/2011, 08/08/2012, 08/05/2013, 08/28/2014, 08/11/2015, 08/29/2015    Influenza Vaccine Quad (IIV4 PF) 3mo+ injectable 08/16/2015, 09/06/2016, 09/11/2017, 08/06/2019, 11/07/2022    Influenza Virus Vaccine, unspecified formulation 08/30/2016, 09/18/2017, 09/04/2018, 10/02/2018    PNEUMOCOCCAL POLYSACCHARIDE 23-VALENT 07/06/2015    PPD Test 09/08/2011, 09/07/2012, 09/05/2013, 09/09/2014, 09/07/2015, 08/10/2016, 09/11/2017, 09/05/2018, 01/03/2019 Pneumococcal Conjugate 13-Valent 07/24/2010, 07/15/2015    Pneumococcal Conjugate 20-valent 08/30/2021    TdaP 12/31/2019         Follow-Up: Return to clinic in 6 months.      Clelia Croft, DO  Division of Nephrology and Hypertension  [facility]  11/11/2022  2:49 PM

## 2022-11-21 NOTE — Unmapped (Signed)
[facility] Refill Coordination Note    Specialty Medication(s) to be Shipped:   Transplant: Envarsus 1mg  and Myfortic 180mg     Other medication(s) to be shipped:  [employer], DOB: 05-Sep-1961  Phone: [phone] (home)       All above HIPAA information was verified with patient.     Was a Nurse, learning disability used for this call? No    Completed refill call assessment today to schedule patient's medication shipment from the [facility] [phone]).  All relevant notes have been reviewed.     Specialty medication(s) and dose(s) confirmed: Regimen is correct and unchanged.   Changes to medications: Jakin reports no changes at this time.  Changes to insurance: No  New side effects reported not previously addressed with a pharmacist or physician: None reported  Questions for the pharmacist: No    Confirmed patient received a Conservation officer, historic buildings and a Surveyor, mining with first shipment. The patient will receive a drug information handout for each medication shipped and additional FDA Medication Guides as required.       DISEASE/MEDICATION-SPECIFIC INFORMATION        N/A    SPECIALTY MEDICATION ADHERENCE     Medication Adherence    Patient reported X missed doses in the last month: 0  Specialty Medication: ENVARSUS XR 1 mg Tb24 extended release tablet (tacrolimus)  Patient is on additional specialty medications: Yes  Additional Specialty Medications: MYFORTIC 180 MG EC tablet (mycophenolate)  Patient Reported Additional Medication X Missed Doses in the Last Month: 0  Patient is on more than two specialty medications: No                                Were doses missed due to medication being on hold? No    Envarsus 1 mg: 10 days of medicine on hand   Myfortic 180 mg: 10 days of medicine on hand       REFERRAL TO PHARMACIST     Referral to the pharmacist: Not needed      [facility]     Shipping address confirmed in Epic.     Delivery Scheduled: Yes, Expected medication delivery date: 11/25/22.     Medication will be delivered via Next Day Courier to the prescription address in Epic WAM.    [name]   [facility] Specialty Technician

## 2022-11-24 MED FILL — ENVARSUS XR 1 MG TABLET,EXTENDED RELEASE: ORAL | 30 days supply | Qty: 60 | Fill #2

## 2022-11-24 MED FILL — MYFORTIC 180 MG TABLET,DELAYED RELEASE: ORAL | 30 days supply | Qty: 180 | Fill #10

## 2022-11-24 MED FILL — MG-PLUS-PROTEIN 133 MG TABLET: ORAL | 30 days supply | Qty: 30 | Fill #3

## 2022-11-30 ENCOUNTER — Ambulatory Visit: Admit: 2022-11-30 | Discharge: 2022-12-01 | Payer: MEDICARE

## 2022-12-15 ENCOUNTER — Ambulatory Visit: Admit: 2022-12-15 | Discharge: 2022-12-16 | Payer: MEDICARE

## 2022-12-15 LAB — CBC W/ AUTO DIFF
BASOPHILS ABSOLUTE COUNT: 0 10*9/L (ref 0.0–0.1)
BASOPHILS RELATIVE PERCENT: 1.2 %
EOSINOPHILS ABSOLUTE COUNT: 0.3 10*9/L (ref 0.0–0.5)
EOSINOPHILS RELATIVE PERCENT: 7.1 %
HEMATOCRIT: 45.9 % (ref 39.0–48.0)
HEMOGLOBIN: 15.1 g/dL (ref 12.9–16.5)
LYMPHOCYTES ABSOLUTE COUNT: 0.5 10*9/L — ABNORMAL LOW (ref 1.1–3.6)
LYMPHOCYTES RELATIVE PERCENT: 13.9 %
MEAN CORPUSCULAR HEMOGLOBIN CONC: 32.8 g/dL (ref 32.0–36.0)
MEAN CORPUSCULAR HEMOGLOBIN: 30.4 pg (ref 25.9–32.4)
MEAN CORPUSCULAR VOLUME: 92.5 fL (ref 77.6–95.7)
MEAN PLATELET VOLUME: 9 fL (ref 6.8–10.7)
MONOCYTES ABSOLUTE COUNT: 0.3 10*9/L (ref 0.3–0.8)
MONOCYTES RELATIVE PERCENT: 7.3 %
NEUTROPHILS ABSOLUTE COUNT: 2.6 10*9/L (ref 1.8–7.8)
NEUTROPHILS RELATIVE PERCENT: 70.5 %
NUCLEATED RED BLOOD CELLS: 0 /100{WBCs} (ref <=4)
PLATELET COUNT: 158 10*9/L (ref 150–450)
RED BLOOD CELL COUNT: 4.96 10*12/L (ref 4.26–5.60)
RED CELL DISTRIBUTION WIDTH: 14.6 % (ref 12.2–15.2)
WBC ADJUSTED: 3.7 10*9/L (ref 3.6–11.2)

## 2022-12-15 LAB — MAGNESIUM: MAGNESIUM: 2 mg/dL (ref 1.6–2.6)

## 2022-12-15 LAB — BASIC METABOLIC PANEL
ANION GAP: 7 mmol/L (ref 5–14)
BLOOD UREA NITROGEN: 20 mg/dL (ref 9–23)
BUN / CREAT RATIO: 28
CALCIUM: 9.7 mg/dL (ref 8.7–10.4)
CHLORIDE: 108 mmol/L — ABNORMAL HIGH (ref 98–107)
CO2: 27.2 mmol/L (ref 20.0–31.0)
CREATININE: 0.71 mg/dL — ABNORMAL LOW
EGFR CKD-EPI (2021) MALE: >90 mL/min/{1.73_m2} (ref >=60)
GLUCOSE RANDOM: 123 mg/dL (ref 70–179)
POTASSIUM: 4.1 mmol/L (ref 3.4–4.8)
SODIUM: 142 mmol/L (ref 135–145)

## 2022-12-15 LAB — TACROLIMUS LEVEL, TROUGH: TACROLIMUS, TROUGH: 8 ng/mL (ref 5.0–15.0)

## 2022-12-15 LAB — PHOSPHORUS: PHOSPHORUS: 3.1 mg/dL (ref 2.4–5.1)

## 2022-12-20 NOTE — Unmapped (Signed)
[facility] Clinical Assessment & Refill Coordination Note    Louis Dennis, Louis Dennis: [birth year]  Phone: [phone] (home)     All above HIPAA information was verified with patient's family member, wife.     Was a Nurse, learning disability used for this call? 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 DO NOT RINSE WITH WATER AND DO NOT SWALLOW. USE AM AND PM      DENTA 5000 PLUS 1.1 % Crea USE DAY AND NIGHT, BRUSH THOROUGHLY FOR 2 MINS AND SPIT OUT. DO NOT SWALLOW AND RINSE LIGHTLY      magnesium oxide-Mg AA chelate (MAGNESIUM, AMINO ACID CHELATE,) 133 mg Take 1 tablet by mouth daily. 30 tablet 11    MYFORTIC 180 mg EC tablet Take 3 tablets (540 mg total) by mouth Two (2) times a day. 180 tablet 11    tacrolimus (ENVARSUS XR) 1 mg Tb24 extended release tablet Take 2 tablets (2 mg total) by mouth in the morning. 60 tablet 11     No current facility-administered medications for this visit.        Changes to medications: Lovell reports no changes at this time.    No Known Allergies    Changes to allergies: No    SPECIALTY MEDICATION ADHERENCE     Envarsus 1mg   : 7 days of medicine on hand   Myfortic 180mg   : 7 days of medicine on hand       Medication Adherence    Patient reported X missed doses in the last month: 0  Specialty Medication: envarsus 1mg   Patient is on additional specialty medications: Yes  Additional Specialty Medications: Myfortic 180mg   Patient Reported Additional Medication X Missed Doses in the Last Month: 0                            Specialty medication(s) dose(s) confirmed: Regimen is correct and unchanged.     Are there any concerns with adherence? No    Adherence counseling provided? Not needed    CLINICAL MANAGEMENT AND INTERVENTION      Clinical Benefit Assessment:    Do you feel the medicine is effective or helping your condition? Yes    Clinical Benefit counseling provided? Not needed    Adverse Effects Assessment:    Are you experiencing any side effects? No    Are you experiencing difficulty administering your medicine? No    Quality of Life Assessment:    Quality of Life    Rheumatology  Oncology  Dermatology  Cystic Fibrosis          How many days over the past month did your transplant  keep you from your normal activities? For example, brushing your teeth or getting up in the morning. 0    Have you discussed this with your provider? Not needed    Acute Infection Status:    Acute infections noted within Epic:  No active infections  Patient reported infection: None    Therapy Appropriateness:    Is therapy appropriate and patient progressing towards therapeutic goals? Yes, therapy is appropriate and should be continued    DISEASE/MEDICATION-SPECIFIC INFORMATION      N/A    Solid Organ Transplant: Not Applicable  PATIENT SPECIFIC NEEDS     Does the patient have any physical, cognitive, or cultural barriers? No    Is the patient high risk? No    Did the patient require a clinical intervention? No    Does the patient require physician intervention or other additional services (i.e., nutrition, smoking cessation, social work)? No    SOCIAL DETERMINANTS OF HEALTH     At the [facility], we have learned that life circumstances - like trouble affording food, housing, utilities, or transportation can affect the health of many of our patients.   That is why we wanted to ask: are you currently experiencing any life circumstances that are negatively impacting your health and/or quality of life? Patient declined to answer    Social Determinants of Health     Financial Resource Strain: Low Risk  (11/07/2022)    Overall Financial Resource Strain (CARDIA)     Difficulty of Paying Living Expenses: Not hard at all   Internet Connectivity: Not on file   Food Insecurity: No Food Insecurity (11/07/2022)    Hunger Vital Sign     Worried About Running Out of Food in the Last Year: Never true     Ran Out of Food in the Last Year: Never true   Tobacco Use: Medium Risk (11/14/2022)    Patient History     Smoking Tobacco Use: Former     Smokeless Tobacco Use: Never     Passive Exposure: Never   Housing/Utilities: Low Risk  (11/07/2022)    Housing/Utilities     Within the past 12 months, have you ever stayed: outside, in a car, in a tent, in an overnight shelter, or temporarily in someone else's home (i.e. couch-surfing)?: No     Are you worried about losing your housing?: No     Within the past 12 months, have you been unable to get utilities (heat, electricity) when it was really needed?: No   Alcohol Use: Not At Risk (11/07/2022)    Alcohol Use     How often do you have a drink containing alcohol?: Never     How many drinks containing alcohol do you have on a typical day when you are drinking?: Not on file     How often do you have 5 or more drinks on one occasion?: Never   Transportation Needs: No Transportation Needs (11/07/2022)    PRAPARE - Transportation     Lack of Transportation (Medical): No     Lack of Transportation (Non-Medical): No   Substance Use: Low Risk  (11/07/2022)    Substance Use     Taken prescription drugs for non-medical reasons: Never     Taken illegal drugs: Never     Patient indicated they have taken drugs in the past year for non-medical reasons: Yes, [positive answer(s)]: Not on file   Health Literacy: High Risk (05/07/2021)    Health Literacy     : Often   Physical Activity: Sufficiently Active (05/27/2019)    Exercise Vital Sign     Days of Exercise per Week: 7 days     Minutes of Exercise per Session: 90 min   Interpersonal Safety: Not on file   Stress: No Stress Concern Present (05/27/2019)    [employer] of Occupational Health - Occupational Stress Questionnaire     Feeling of Stress : Only a little   Intimate Partner Violence: Not At Risk (05/27/2019)    Humiliation, Afraid, Rape, and Kick questionnaire  Fear of Current or Ex-Partner: No     Emotionally Abused: No     Physically Abused: No     Sexually Abused: No   Depression: Not at risk (03/08/2022)    PHQ-2     PHQ-2 Score: 0   Social Connections: Socially Integrated (05/27/2019)    Social Connection and Isolation Panel [NHANES]     Frequency of Communication with Friends and Family: More than three times a week     Frequency of Social Gatherings with Friends and Family: More than three times a week     Attends Religious Services: More than 4 times per year     Active Member of [employer] or Organizations: Yes     Attends Engineer, structural: More than 4 times per year     Marital Status: Married       Would you be willing to receive help with any of the needs that you have identified today? 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     Medication will be delivered via Next Day Courier to the confirmed prescription address in [place].  Patient's wife verified address is not [address], but is actually [facility] [address]- this was updated in wam.    The patient will receive a drug information handout for each medication shipped and additional FDA Medication Guides as required.  Verified that patient has previously received a Conservation officer, historic buildings and a Surveyor, mining.    The patient or caregiver noted above participated in the development of this care plan and knows that they can request review of or adjustments to the care plan at any time.      All of the patient's questions and concerns have been addressed.    Thad Ranger, PharmD   [facility] Specialty Pharmacist

## 2022-12-22 MED FILL — ENVARSUS XR 1 MG TABLET,EXTENDED RELEASE: ORAL | 30 days supply | Qty: 60 | Fill #3

## 2022-12-22 MED FILL — MYFORTIC 180 MG TABLET,DELAYED RELEASE: ORAL | 30 days supply | Qty: 180 | Fill #11

## 2022-12-22 MED FILL — MG-PLUS-PROTEIN 133 MG TABLET: ORAL | 30 days supply | Qty: 30 | Fill #4

## 2023-01-10 ENCOUNTER — Ambulatory Visit: Admit: 2023-01-10 | Discharge: 2023-01-11 | Payer: MEDICARE

## 2023-01-10 LAB — URINALYSIS WITH MICROSCOPY WITH CULTURE REFLEX
BACTERIA: NONE SEEN /HPF
BILIRUBIN UA: NEGATIVE
BLOOD UA: NEGATIVE
GLUCOSE UA: NEGATIVE
KETONES UA: NEGATIVE
LEUKOCYTE ESTERASE UA: NEGATIVE
NITRITE UA: NEGATIVE
PH UA: 6 (ref 5.0–9.0)
PROTEIN UA: NEGATIVE
RBC UA: <1 /HPF (ref <=3)
SPECIFIC GRAVITY UA: 1.019 (ref 1.003–1.030)
SQUAMOUS EPITHELIAL: <1 /HPF (ref 0–5)
UROBILINOGEN UA: <2
WBC UA: <1 /HPF (ref <=2)

## 2023-01-10 LAB — PHOSPHORUS: PHOSPHORUS: 2.7 mg/dL (ref 2.4–5.1)

## 2023-01-10 LAB — CBC W/ AUTO DIFF
BASOPHILS ABSOLUTE COUNT: 0 10*9/L (ref 0.0–0.1)
BASOPHILS RELATIVE PERCENT: 1.2 %
EOSINOPHILS ABSOLUTE COUNT: 0.3 10*9/L (ref 0.0–0.5)
EOSINOPHILS RELATIVE PERCENT: 7 %
HEMATOCRIT: 44.3 % (ref 39.0–48.0)
HEMOGLOBIN: 14.8 g/dL (ref 12.9–16.5)
LYMPHOCYTES ABSOLUTE COUNT: 0.6 10*9/L — ABNORMAL LOW (ref 1.1–3.6)
LYMPHOCYTES RELATIVE PERCENT: 14.7 %
MEAN CORPUSCULAR HEMOGLOBIN CONC: 33.5 g/dL (ref 32.0–36.0)
MEAN CORPUSCULAR HEMOGLOBIN: 30.6 pg (ref 25.9–32.4)
MEAN CORPUSCULAR VOLUME: 91.3 fL (ref 77.6–95.7)
MEAN PLATELET VOLUME: 9 fL (ref 6.8–10.7)
MONOCYTES ABSOLUTE COUNT: 0.3 10*9/L (ref 0.3–0.8)
MONOCYTES RELATIVE PERCENT: 7.5 %
NEUTROPHILS ABSOLUTE COUNT: 2.7 10*9/L (ref 1.8–7.8)
NEUTROPHILS RELATIVE PERCENT: 69.6 %
NUCLEATED RED BLOOD CELLS: 0 /100{WBCs} (ref <=4)
PLATELET COUNT: 151 10*9/L (ref 150–450)
RED BLOOD CELL COUNT: 4.85 10*12/L (ref 4.26–5.60)
RED CELL DISTRIBUTION WIDTH: 14.6 % (ref 12.2–15.2)
WBC ADJUSTED: 3.9 10*9/L (ref 3.6–11.2)

## 2023-01-10 LAB — BASIC METABOLIC PANEL
ANION GAP: 5 mmol/L (ref 5–14)
BLOOD UREA NITROGEN: 15 mg/dL (ref 9–23)
BUN / CREAT RATIO: 22
CALCIUM: 9.2 mg/dL (ref 8.7–10.4)
CHLORIDE: 109 mmol/L — ABNORMAL HIGH (ref 98–107)
CO2: 26.6 mmol/L (ref 20.0–31.0)
CREATININE: 0.69 mg/dL — ABNORMAL LOW
EGFR CKD-EPI (2021) MALE: >90 mL/min/{1.73_m2} (ref >=60)
GLUCOSE RANDOM: 135 mg/dL (ref 70–179)
POTASSIUM: 4.2 mmol/L (ref 3.4–4.8)
SODIUM: 141 mmol/L (ref 135–145)

## 2023-01-10 LAB — TACROLIMUS LEVEL, TROUGH: TACROLIMUS, TROUGH: 7.7 ng/mL (ref 5.0–15.0)

## 2023-01-10 LAB — MAGNESIUM: MAGNESIUM: 1.9 mg/dL (ref 1.6–2.6)

## 2023-01-12 DIAGNOSIS — Z940 Kidney transplant status: Principal | ICD-10-CM

## 2023-01-12 MED ORDER — MYFORTIC 180 MG TABLET,DELAYED RELEASE
ORAL_TABLET | Freq: Two times a day (BID) | ORAL | 11 refills | 30 days | Status: CP
Start: 2023-01-12 — End: 2024-01-12
  Filled 2023-01-17: qty 180, 30d supply, fill #0

## 2023-01-12 NOTE — Unmapped (Signed)
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     Was a Nurse, learning disability used for this call? No    Completed refill call assessment today to schedule patient's medication shipment from the [facility] [phone]).  All relevant notes have been reviewed.     Specialty medication(s) and dose(s) confirmed: Regimen is correct and unchanged.   Changes to medications: Prithvi reports no changes at this time.  Changes to insurance: No  New side effects reported not previously addressed with a pharmacist or physician: None reported  Questions for the pharmacist: No    Confirmed patient received a Conservation officer, historic buildings and a Surveyor, mining with first shipment. The patient will receive a drug information handout for each medication shipped and additional FDA Medication Guides as required.       DISEASE/MEDICATION-SPECIFIC INFORMATION        N/A    SPECIALTY MEDICATION ADHERENCE     Medication Adherence    Patient reported X missed doses in the last month: 0  Specialty Medication: ENVARSUS XR 1 mg Tb24 extended release tablet (tacrolimus)  Patient is on additional specialty medications: Yes  Additional Specialty Medications: MYFORTIC 180 MG EC tablet (mycophenolate)  Patient Reported Additional Medication X Missed Doses in the Last Month: 0  Patient is on more than two specialty medications: No  Any gaps in refill history greater than 2 weeks in the last 3 months: no  Demonstrates understanding of importance of adherence: yes  Informant: patient  Reliability of informant: reliable  Provider-estimated medication adherence level: good  Patient is at risk for Non-Adherence: Yes  Reasons for non-adherence: no problems identified Confirmed plan for next specialty medication refill: delivery by pharmacy  Refills needed for supportive medications: yes, ordered or provider notified          Refill Coordination    Has the Patients' Contact Information Changed: No  Is the Shipping Address Different: No         Were doses missed due to medication being on hold? No    envarsus 1 mg: 10 days of medicine on hand   myfortic 180 mg: 10 days of medicine on hand       REFERRAL TO PHARMACIST     Referral to the pharmacist: Not needed      [facility]     Shipping address confirmed in Epic.     Delivery Scheduled: Yes, Expected medication delivery date: 02/13.  However, Rx request for refills was sent to the provider as there are none remaining.     Medication will be delivered via Next Day Courier to the prescription address in Epic WAM.    Antonietta Barcelona   [facility] Specialty Technician

## 2023-01-17 MED FILL — MG-PLUS-PROTEIN 133 MG TABLET: ORAL | 30 days supply | Qty: 30 | Fill #5

## 2023-01-17 MED FILL — ENVARSUS XR 1 MG TABLET,EXTENDED RELEASE: ORAL | 30 days supply | Qty: 60 | Fill #4

## 2023-02-07 ENCOUNTER — Ambulatory Visit: Admit: 2023-02-07 | Discharge: 2023-02-08 | Payer: MEDICARE

## 2023-02-07 LAB — CBC W/ AUTO DIFF
BASOPHILS ABSOLUTE COUNT: 0 10*9/L (ref 0.0–0.1)
BASOPHILS RELATIVE PERCENT: 0.6 %
EOSINOPHILS ABSOLUTE COUNT: 0.3 10*9/L (ref 0.0–0.5)
EOSINOPHILS RELATIVE PERCENT: 6 %
HEMATOCRIT: 44 % (ref 39.0–48.0)
HEMOGLOBIN: 14.6 g/dL (ref 12.9–16.5)
LYMPHOCYTES ABSOLUTE COUNT: 0.5 10*9/L — ABNORMAL LOW (ref 1.1–3.6)
LYMPHOCYTES RELATIVE PERCENT: 12.9 %
MEAN CORPUSCULAR HEMOGLOBIN CONC: 33.2 g/dL (ref 32.0–36.0)
MEAN CORPUSCULAR HEMOGLOBIN: 30.3 pg (ref 25.9–32.4)
MEAN CORPUSCULAR VOLUME: 91.2 fL (ref 77.6–95.7)
MEAN PLATELET VOLUME: 8.6 fL (ref 6.8–10.7)
MONOCYTES ABSOLUTE COUNT: 0.3 10*9/L (ref 0.3–0.8)
MONOCYTES RELATIVE PERCENT: 6.5 %
NEUTROPHILS ABSOLUTE COUNT: 3.1 10*9/L (ref 1.8–7.8)
NEUTROPHILS RELATIVE PERCENT: 74 %
NUCLEATED RED BLOOD CELLS: 0 /100{WBCs} (ref <=4)
PLATELET COUNT: 170 10*9/L (ref 150–450)
RED BLOOD CELL COUNT: 4.83 10*12/L (ref 4.26–5.60)
RED CELL DISTRIBUTION WIDTH: 14.8 % (ref 12.2–15.2)
WBC ADJUSTED: 4.1 10*9/L (ref 3.6–11.2)

## 2023-02-07 LAB — TACROLIMUS LEVEL, TROUGH: TACROLIMUS, TROUGH: 9.3 ng/mL (ref 5.0–15.0)

## 2023-02-07 LAB — PHOSPHORUS: PHOSPHORUS: 2.9 mg/dL (ref 2.4–5.1)

## 2023-02-07 LAB — BASIC METABOLIC PANEL
ANION GAP: 4 mmol/L — ABNORMAL LOW (ref 5–14)
BLOOD UREA NITROGEN: 21 mg/dL (ref 9–23)
BUN / CREAT RATIO: 30
CALCIUM: 9.5 mg/dL (ref 8.7–10.4)
CHLORIDE: 109 mmol/L — ABNORMAL HIGH (ref 98–107)
CO2: 25.9 mmol/L (ref 20.0–31.0)
CREATININE: 0.69 mg/dL — ABNORMAL LOW
EGFR CKD-EPI (2021) MALE: >90 mL/min/{1.73_m2} (ref >=60)
GLUCOSE RANDOM: 124 mg/dL (ref 70–179)
POTASSIUM: 4.2 mmol/L (ref 3.4–4.8)
SODIUM: 139 mmol/L (ref 135–145)

## 2023-02-07 LAB — MAGNESIUM: MAGNESIUM: 1.9 mg/dL (ref 1.6–2.6)

## 2023-02-09 NOTE — Unmapped (Signed)
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     Was a Nurse, learning disability used for this call? No    Completed refill call assessment today to schedule patient's medication shipment from the [facility] [phone]).  All relevant notes have been reviewed.     Specialty medication(s) and dose(s) confirmed: Regimen is correct and unchanged.   Changes to medications: Azel reports no changes at this time.  Changes to insurance: No  New side effects reported not previously addressed with a pharmacist or physician: None reported  Questions for the pharmacist: No    Confirmed patient received a Conservation officer, historic buildings and a Surveyor, mining with first shipment. The patient will receive a drug information handout for each medication shipped and additional FDA Medication Guides as required.       DISEASE/MEDICATION-SPECIFIC INFORMATION        N/A    SPECIALTY MEDICATION ADHERENCE     Medication Adherence    Patient reported X missed doses in the last month: 0  Specialty Medication: ENVARSUS XR 1 mg Tb24 extended release tablet (tacrolimus)  Patient is on additional specialty medications: Yes  Additional Specialty Medications: MYFORTIC 180 MG EC tablet (mycophenolate)  Patient Reported Additional Medication X Missed Doses in the Last Month: 0  Patient is on more than two specialty medications: No              Were doses missed due to medication being on hold? No    Envarsus 1 mg: 12 days of medicine on hand   Myfortic 180 mg: 12 days of medicine on hand       REFERRAL TO PHARMACIST     Referral to the pharmacist: Not needed      [facility]     Shipping address confirmed in Epic.     Patient was notified of new phone menu : No    Delivery Scheduled: Yes, Expected medication delivery date: 02/15/23.     Medication will be delivered via Next Day Courier to the prescription address in Epic WAM.    [name]   [facility] Specialty Technician

## 2023-02-14 MED FILL — ENVARSUS XR 1 MG TABLET,EXTENDED RELEASE: ORAL | 30 days supply | Qty: 60 | Fill #5

## 2023-02-14 MED FILL — MG-PLUS-PROTEIN 133 MG TABLET: ORAL | 30 days supply | Qty: 30 | Fill #6

## 2023-02-14 MED FILL — MYFORTIC 180 MG TABLET,DELAYED RELEASE: ORAL | 30 days supply | Qty: 180 | Fill #1

## 2023-03-06 ENCOUNTER — Ambulatory Visit: Admit: 2023-03-06 | Discharge: 2023-03-07 | Payer: MEDICARE

## 2023-03-06 LAB — CBC W/ AUTO DIFF
BASOPHILS ABSOLUTE COUNT: 0 10*9/L (ref 0.0–0.1)
BASOPHILS RELATIVE PERCENT: 1.1 %
EOSINOPHILS ABSOLUTE COUNT: 0.3 10*9/L (ref 0.0–0.5)
EOSINOPHILS RELATIVE PERCENT: 6.9 %
HEMATOCRIT: 43.1 % (ref 39.0–48.0)
HEMOGLOBIN: 14.2 g/dL (ref 12.9–16.5)
LYMPHOCYTES ABSOLUTE COUNT: 0.5 10*9/L — ABNORMAL LOW (ref 1.1–3.6)
LYMPHOCYTES RELATIVE PERCENT: 14.4 %
MEAN CORPUSCULAR HEMOGLOBIN CONC: 33 g/dL (ref 32.0–36.0)
MEAN CORPUSCULAR HEMOGLOBIN: 30.3 pg (ref 25.9–32.4)
MEAN CORPUSCULAR VOLUME: 91.9 fL (ref 77.6–95.7)
MEAN PLATELET VOLUME: 9.2 fL (ref 6.8–10.7)
MONOCYTES ABSOLUTE COUNT: 0.3 10*9/L (ref 0.3–0.8)
MONOCYTES RELATIVE PERCENT: 8.7 %
NEUTROPHILS ABSOLUTE COUNT: 2.5 10*9/L (ref 1.8–7.8)
NEUTROPHILS RELATIVE PERCENT: 68.9 %
NUCLEATED RED BLOOD CELLS: 0 /100{WBCs} (ref <=4)
PLATELET COUNT: 161 10*9/L (ref 150–450)
RED BLOOD CELL COUNT: 4.7 10*12/L (ref 4.26–5.60)
RED CELL DISTRIBUTION WIDTH: 15.3 % — ABNORMAL HIGH (ref 12.2–15.2)
WBC ADJUSTED: 3.6 10*9/L (ref 3.6–11.2)

## 2023-03-06 LAB — BASIC METABOLIC PANEL
ANION GAP: 6 mmol/L (ref 5–14)
BLOOD UREA NITROGEN: 17 mg/dL (ref 9–23)
BUN / CREAT RATIO: 24
CALCIUM: 9.5 mg/dL (ref 8.7–10.4)
CHLORIDE: 108 mmol/L — ABNORMAL HIGH (ref 98–107)
CO2: 27.7 mmol/L (ref 20.0–31.0)
CREATININE: 0.71 mg/dL — ABNORMAL LOW
EGFR CKD-EPI (2021) MALE: >90 mL/min/{1.73_m2} (ref >=60)
GLUCOSE RANDOM: 143 mg/dL (ref 70–179)
POTASSIUM: 4.4 mmol/L (ref 3.4–4.8)
SODIUM: 142 mmol/L (ref 135–145)

## 2023-03-06 LAB — URINALYSIS WITH MICROSCOPY WITH CULTURE REFLEX
BACTERIA: NONE SEEN /HPF
BILIRUBIN UA: NEGATIVE
BLOOD UA: NEGATIVE
GLUCOSE UA: NEGATIVE
KETONES UA: NEGATIVE
LEUKOCYTE ESTERASE UA: NEGATIVE
NITRITE UA: NEGATIVE
PH UA: 6.5 (ref 5.0–9.0)
PROTEIN UA: NEGATIVE
RBC UA: <1 /HPF (ref <=3)
SPECIFIC GRAVITY UA: 1.018 (ref 1.003–1.030)
SQUAMOUS EPITHELIAL: <1 /HPF (ref 0–5)
UROBILINOGEN UA: <2
WBC UA: 1 /HPF (ref <=2)

## 2023-03-06 LAB — MAGNESIUM: MAGNESIUM: 1.9 mg/dL (ref 1.6–2.6)

## 2023-03-06 LAB — TACROLIMUS LEVEL, TROUGH: TACROLIMUS, TROUGH: 8.3 ng/mL (ref 5.0–15.0)

## 2023-03-06 LAB — PHOSPHORUS: PHOSPHORUS: 2.8 mg/dL (ref 2.4–5.1)

## 2023-03-13 NOTE — Unmapped (Signed)
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     Was a Nurse, learning disability used for this call? No    Completed refill call assessment today to schedule patient's medication shipment from the [facility] [phone]).  All relevant notes have been reviewed.     Specialty medication(s) and dose(s) confirmed: Regimen is correct and unchanged.   Changes to medications: Deklyn reports no changes at this time.  Changes to insurance: No  New side effects reported not previously addressed with a pharmacist or physician: None reported  Questions for the pharmacist: No    Confirmed patient received a Conservation officer, historic buildings and a Surveyor, mining with first shipment. The patient will receive a drug information handout for each medication shipped and additional FDA Medication Guides as required.       DISEASE/MEDICATION-SPECIFIC INFORMATION        N/A    SPECIALTY MEDICATION ADHERENCE     Medication Adherence    Patient reported X missed doses in the last month: 0  Specialty Medication: ENVARSUS XR 1 mg Tb24 extended release tablet (tacrolimus)  Patient is on additional specialty medications: Yes  Additional Specialty Medications: MYFORTIC 180 mg EC tablet (mycophenolate)  Patient Reported Additional Medication X Missed Doses in the Last Month: 0  Patient is on more than two specialty medications: No              Were doses missed due to medication being on hold? No    Envarsus 1 mg: 10 days of medicine on hand   Myfortic 180 mg: 10 days of medicine on hand       REFERRAL TO PHARMACIST     Referral to the pharmacist: Not needed      [facility]     Shipping address confirmed in Epic.     Patient was notified of new phone menu : No    Delivery Scheduled: Yes, Expected medication delivery date: 03/17/23.     Medication will be delivered via Next Day Courier to the prescription address in Epic WAM.    [name]   [facility] Specialty Technician

## 2023-03-16 MED FILL — MG-PLUS-PROTEIN 133 MG TABLET: ORAL | 30 days supply | Qty: 30 | Fill #7

## 2023-03-16 MED FILL — ENVARSUS XR 1 MG TABLET,EXTENDED RELEASE: ORAL | 30 days supply | Qty: 60 | Fill #6

## 2023-03-16 MED FILL — MYFORTIC 180 MG TABLET,DELAYED RELEASE: ORAL | 30 days supply | Qty: 180 | Fill #2

## 2023-04-07 ENCOUNTER — Ambulatory Visit: Admit: 2023-04-07 | Discharge: 2023-04-08 | Payer: MEDICARE

## 2023-04-07 LAB — URINALYSIS WITH MICROSCOPY WITH CULTURE REFLEX
BACTERIA: NONE SEEN /HPF
BILIRUBIN UA: NEGATIVE
BLOOD UA: NEGATIVE
GLUCOSE UA: NEGATIVE
KETONES UA: NEGATIVE
LEUKOCYTE ESTERASE UA: NEGATIVE
NITRITE UA: NEGATIVE
PH UA: 5.5 (ref 5.0–9.0)
PROTEIN UA: NEGATIVE
RBC UA: 2 /HPF (ref <=3)
SPECIFIC GRAVITY UA: 1.018 (ref 1.003–1.030)
SQUAMOUS EPITHELIAL: <1 /HPF (ref 0–5)
UROBILINOGEN UA: <2
WBC UA: 1 /HPF (ref <=2)

## 2023-04-07 LAB — BASIC METABOLIC PANEL
ANION GAP: 6 mmol/L (ref 5–14)
BLOOD UREA NITROGEN: 22 mg/dL (ref 9–23)
BUN / CREAT RATIO: 30
CALCIUM: 9.6 mg/dL (ref 8.7–10.4)
CHLORIDE: 106 mmol/L (ref 98–107)
CO2: 28.7 mmol/L (ref 20.0–31.0)
CREATININE: 0.73 mg/dL
EGFR CKD-EPI (2021) MALE: >90 mL/min/{1.73_m2} (ref >=60)
GLUCOSE RANDOM: 129 mg/dL (ref 70–179)
POTASSIUM: 4.3 mmol/L (ref 3.4–4.8)
SODIUM: 141 mmol/L (ref 135–145)

## 2023-04-07 LAB — CBC W/ AUTO DIFF
BASOPHILS ABSOLUTE COUNT: 0.1 10*9/L (ref 0.0–0.1)
BASOPHILS RELATIVE PERCENT: 1.4 %
EOSINOPHILS ABSOLUTE COUNT: 0.2 10*9/L (ref 0.0–0.5)
EOSINOPHILS RELATIVE PERCENT: 6.6 %
HEMATOCRIT: 42.9 % (ref 39.0–48.0)
HEMOGLOBIN: 14.3 g/dL (ref 12.9–16.5)
LYMPHOCYTES ABSOLUTE COUNT: 0.6 10*9/L — ABNORMAL LOW (ref 1.1–3.6)
LYMPHOCYTES RELATIVE PERCENT: 14.9 %
MEAN CORPUSCULAR HEMOGLOBIN CONC: 33.4 g/dL (ref 32.0–36.0)
MEAN CORPUSCULAR HEMOGLOBIN: 30.9 pg (ref 25.9–32.4)
MEAN CORPUSCULAR VOLUME: 92.6 fL (ref 77.6–95.7)
MEAN PLATELET VOLUME: 8.5 fL (ref 6.8–10.7)
MONOCYTES ABSOLUTE COUNT: 0.4 10*9/L (ref 0.3–0.8)
MONOCYTES RELATIVE PERCENT: 9.3 %
NEUTROPHILS ABSOLUTE COUNT: 2.6 10*9/L (ref 1.8–7.8)
NEUTROPHILS RELATIVE PERCENT: 67.8 %
NUCLEATED RED BLOOD CELLS: 0 /100{WBCs} (ref <=4)
PLATELET COUNT: 179 10*9/L (ref 150–450)
RED BLOOD CELL COUNT: 4.63 10*12/L (ref 4.26–5.60)
RED CELL DISTRIBUTION WIDTH: 15.3 % — ABNORMAL HIGH (ref 12.2–15.2)
WBC ADJUSTED: 3.8 10*9/L (ref 3.6–11.2)

## 2023-04-07 LAB — TACROLIMUS LEVEL, TROUGH: TACROLIMUS, TROUGH: 9.2 ng/mL (ref 5.0–15.0)

## 2023-04-07 LAB — MAGNESIUM: MAGNESIUM: 1.8 mg/dL (ref 1.6–2.6)

## 2023-04-07 LAB — PHOSPHORUS: PHOSPHORUS: 3.1 mg/dL (ref 2.4–5.1)

## 2023-04-13 NOTE — Unmapped (Signed)
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     Was a Nurse, learning disability used for this call? No    Completed refill call assessment today to schedule patient's medication shipment from the [facility] [phone]).  All relevant notes have been reviewed.     Specialty medication(s) and dose(s) confirmed: Regimen is correct and unchanged.   Changes to medications: [name] reports no changes at this time.  Changes to insurance: No  New side effects reported not previously addressed with a pharmacist or physician: None reported  Questions for the pharmacist: No    Confirmed patient received a Conservation officer, historic buildings and a Surveyor, mining with first shipment. The patient will receive a drug information handout for each medication shipped and additional FDA Medication Guides as required.       DISEASE/MEDICATION-SPECIFIC INFORMATION        N/A    SPECIALTY MEDICATION ADHERENCE     Medication Adherence    Patient reported X missed doses in the last month: 0  Specialty Medication: ENVARSUS XR 1 mg Tb24 extended release tablet (tacrolimus)  Patient is on additional specialty medications: Yes  Additional Specialty Medications: MYFORTIC 180 mg EC tablet (mycophenolate)  Patient Reported Additional Medication X Missed Doses in the Last Month: 0  Patient is on more than two specialty medications: No              Were doses missed due to medication being on hold? No    Envarsus 1 mg: 10 days of medicine on hand   Myfortic 180 mg: 10 days of medicine on hand       REFERRAL TO PHARMACIST     Referral to the pharmacist: Not needed      [facility]     Shipping address confirmed in Epic.     Patient was notified of new phone menu : No    Delivery Scheduled: Yes, Expected medication delivery date: 04/20/23.     Medication will be delivered via Same Day Courier to the prescription address in Epic WAM.    Louis Dennis   [facility] Specialty Technician

## 2023-04-20 MED FILL — MG-PLUS-PROTEIN 133 MG TABLET: ORAL | 30 days supply | Qty: 30 | Fill #8

## 2023-04-20 MED FILL — MYFORTIC 180 MG TABLET,DELAYED RELEASE: ORAL | 30 days supply | Qty: 180 | Fill #3

## 2023-04-20 MED FILL — ENVARSUS XR 1 MG TABLET,EXTENDED RELEASE: ORAL | 30 days supply | Qty: 60 | Fill #7

## 2023-05-12 NOTE — Unmapped (Signed)
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     Was a Nurse, learning disability used for this call? No    Completed refill call assessment today to schedule patient's medication shipment from the [facility] [phone]).  All relevant notes have been reviewed.     Specialty medication(s) and dose(s) confirmed: Regimen is correct and unchanged.   Changes to medications: Kenard reports no changes at this time.  Changes to insurance: No  New side effects reported not previously addressed with a pharmacist or physician: None reported  Questions for the pharmacist: No    Confirmed patient received a Conservation officer, historic buildings and a Surveyor, mining with first shipment. The patient will receive a drug information handout for each medication shipped and additional FDA Medication Guides as required.       DISEASE/MEDICATION-SPECIFIC INFORMATION        N/A    SPECIALTY MEDICATION ADHERENCE     Medication Adherence    Patient reported X missed doses in the last month: 0  Specialty Medication: ENVARSUS XR 1 mg Tb24 extended release tablet (tacrolimus)  Patient is on additional specialty medications: Yes  Additional Specialty Medications: MYFORTIC 180 mg EC tablet (mycophenolate)  Patient Reported Additional Medication X Missed Doses in the Last Month: 0  Patient is on more than two specialty medications: No              Were doses missed due to medication being on hold? No    Envarsus 1 mg: 8 days of medicine on hand   Myfortic 180 mg: 8 days of medicine on hand       REFERRAL TO PHARMACIST     Referral to the pharmacist: Not needed      [facility]     Shipping address confirmed in Epic.       Delivery Scheduled: Yes, Expected medication delivery date: 05/17/23. Medication will be delivered via Next Day Courier to the prescription address in Epic WAM.    [name]   [facility] Specialty Technician

## 2023-05-16 MED FILL — ENVARSUS XR 1 MG TABLET,EXTENDED RELEASE: ORAL | 30 days supply | Qty: 60 | Fill #8

## 2023-05-16 MED FILL — MG-PLUS-PROTEIN 133 MG TABLET: ORAL | 30 days supply | Qty: 30 | Fill #9

## 2023-05-16 MED FILL — MYFORTIC 180 MG TABLET,DELAYED RELEASE: ORAL | 30 days supply | Qty: 180 | Fill #4

## 2023-05-17 DIAGNOSIS — R7989 Other specified abnormal findings of blood chemistry: Principal | ICD-10-CM

## 2023-05-17 DIAGNOSIS — R82998 Other abnormal findings in urine: Principal | ICD-10-CM

## 2023-05-17 DIAGNOSIS — Z940 Kidney transplant status: Principal | ICD-10-CM

## 2023-05-18 ENCOUNTER — Ambulatory Visit: Admit: 2023-05-18 | Discharge: 2023-05-18 | Payer: MEDICARE

## 2023-05-18 ENCOUNTER — Encounter: Admit: 2023-05-18 | Discharge: 2023-05-18 | Payer: MEDICARE | Attending: Nephrology | Primary: Nephrology

## 2023-05-18 LAB — CBC W/ AUTO DIFF
BASOPHILS ABSOLUTE COUNT: 0.1 10*9/L (ref 0.0–0.1)
BASOPHILS RELATIVE PERCENT: 1.1 %
EOSINOPHILS ABSOLUTE COUNT: 0.3 10*9/L (ref 0.0–0.5)
EOSINOPHILS RELATIVE PERCENT: 6.7 %
HEMATOCRIT: 43.7 % (ref 39.0–48.0)
HEMOGLOBIN: 14.5 g/dL (ref 12.9–16.5)
LYMPHOCYTES ABSOLUTE COUNT: 0.6 10*9/L — ABNORMAL LOW (ref 1.1–3.6)
LYMPHOCYTES RELATIVE PERCENT: 11.2 %
MEAN CORPUSCULAR HEMOGLOBIN CONC: 33 g/dL (ref 32.0–36.0)
MEAN CORPUSCULAR HEMOGLOBIN: 30.9 pg (ref 25.9–32.4)
MEAN CORPUSCULAR VOLUME: 93.6 fL (ref 77.6–95.7)
MEAN PLATELET VOLUME: 8.6 fL (ref 6.8–10.7)
MONOCYTES ABSOLUTE COUNT: 0.4 10*9/L (ref 0.3–0.8)
MONOCYTES RELATIVE PERCENT: 7 %
NEUTROPHILS ABSOLUTE COUNT: 3.7 10*9/L (ref 1.8–7.8)
NEUTROPHILS RELATIVE PERCENT: 74 %
NUCLEATED RED BLOOD CELLS: 0 /100{WBCs} (ref <=4)
PLATELET COUNT: 186 10*9/L (ref 150–450)
RED BLOOD CELL COUNT: 4.67 10*12/L (ref 4.26–5.60)
RED CELL DISTRIBUTION WIDTH: 14.8 % (ref 12.2–15.2)
WBC ADJUSTED: 5.1 10*9/L (ref 3.6–11.2)

## 2023-05-18 LAB — URINALYSIS WITH MICROSCOPY
BACTERIA: NONE SEEN /HPF
BILIRUBIN UA: NEGATIVE
BLOOD UA: NEGATIVE
GLUCOSE UA: NEGATIVE
KETONES UA: NEGATIVE
LEUKOCYTE ESTERASE UA: NEGATIVE
NITRITE UA: NEGATIVE
PH UA: 5 (ref 5.0–9.0)
PROTEIN UA: NEGATIVE
RBC UA: <1 /HPF (ref <=3)
SPECIFIC GRAVITY UA: 1.016 (ref 1.003–1.030)
SQUAMOUS EPITHELIAL: <1 /HPF (ref 0–5)
UROBILINOGEN UA: <2
WBC UA: <1 /HPF (ref <=2)

## 2023-05-18 LAB — CMV DNA, QUANTITATIVE, PCR: CMV VIRAL LD: NOT DETECTED

## 2023-05-18 LAB — COMPREHENSIVE METABOLIC PANEL
ALBUMIN: 4.6 g/dL (ref 3.4–5.0)
ALKALINE PHOSPHATASE: 75 U/L (ref 46–116)
ALT (SGPT): 9 U/L — ABNORMAL LOW (ref 10–49)
ANION GAP: 5 mmol/L (ref 5–14)
AST (SGOT): 14 U/L (ref <=34)
BILIRUBIN TOTAL: 0.6 mg/dL (ref 0.3–1.2)
BLOOD UREA NITROGEN: 21 mg/dL (ref 9–23)
BUN / CREAT RATIO: 30
CALCIUM: 9.6 mg/dL (ref 8.7–10.4)
CHLORIDE: 109 mmol/L — ABNORMAL HIGH (ref 98–107)
CO2: 25.3 mmol/L (ref 20.0–31.0)
CREATININE: 0.69 mg/dL — ABNORMAL LOW
EGFR CKD-EPI (2021) MALE: >90 mL/min/{1.73_m2} (ref >=60)
GLUCOSE RANDOM: 141 mg/dL (ref 70–179)
POTASSIUM: 4.6 mmol/L (ref 3.4–4.8)
PROTEIN TOTAL: 7.2 g/dL (ref 5.7–8.2)
SODIUM: 139 mmol/L (ref 135–145)

## 2023-05-18 LAB — LIPID PANEL
CHOLESTEROL/HDL RATIO SCREEN: 3.3 (ref 1.0–4.5)
CHOLESTEROL: 148 mg/dL (ref <=200)
HDL CHOLESTEROL: 45 mg/dL (ref 40–60)
LDL CHOLESTEROL CALCULATED: 71 mg/dL (ref 40–99)
NON-HDL CHOLESTEROL: 103 mg/dL (ref 70–130)
TRIGLYCERIDES: 160 mg/dL — ABNORMAL HIGH (ref 0–150)
VLDL CHOLESTEROL CAL: 32 mg/dL (ref 12–42)

## 2023-05-18 LAB — PROTEIN / CREATININE RATIO, URINE
CREATININE, URINE: 56.5 mg/dL
PROTEIN URINE: 8.3 mg/dL
PROTEIN/CREAT RATIO, URINE: 0.147

## 2023-05-18 LAB — HEMOGLOBIN A1C
ESTIMATED AVERAGE GLUCOSE: 120 mg/dL
HEMOGLOBIN A1C: 5.8 % — ABNORMAL HIGH (ref 4.8–5.6)

## 2023-05-18 LAB — TACROLIMUS LEVEL, TROUGH: TACROLIMUS, TROUGH: 6.7 ng/mL (ref 5.0–15.0)

## 2023-05-18 LAB — PHOSPHORUS: PHOSPHORUS: 2.5 mg/dL (ref 2.4–5.1)

## 2023-05-18 LAB — MAGNESIUM: MAGNESIUM: 1.8 mg/dL (ref 1.6–2.6)

## 2023-05-18 LAB — BK VIRUS QUANTITATIVE PCR, BLOOD: BK BLOOD RESULT: NOT DETECTED

## 2023-05-19 ENCOUNTER — Ambulatory Visit: Admit: 2023-05-19 | Discharge: 2023-05-19 | Payer: MEDICARE

## 2023-05-19 ENCOUNTER — Ambulatory Visit: Admit: 2023-05-19 | Discharge: 2023-05-19 | Payer: MEDICARE | Attending: Nephrology | Primary: Nephrology

## 2023-05-19 MED ORDER — METOPROLOL TARTRATE 25 MG TABLET
ORAL_TABLET | Freq: Two times a day (BID) | ORAL | 11 refills | 30 days | Status: CP
Start: 2023-05-19 — End: 2024-05-18
  Filled 2023-05-31: qty 30, 30d supply, fill #0

## 2023-05-19 MED ORDER — MG-PLUS-PROTEIN 133 MG TABLET
ORAL_TABLET | Freq: Two times a day (BID) | ORAL | 11 refills | 30 days | Status: CP
Start: 2023-05-19 — End: 2024-05-18
  Filled 2023-05-31: qty 60, 30d supply, fill #0

## 2023-05-19 NOTE — Unmapped (Signed)
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          05/19/23 1354   BP: 113/65   Pulse: 71   Temp: 36.3 ??C (97.3 ??F)   Weight: 62.5 kg (137 lb 12.8 oz)   Height: 160 cm (5' 2.99)   PainSc: 0-No pain       Assessment  BP: monitoring, no lows with restarting Metoprolol, 12.5 mg BID  Lightheaded: no  Headache: no  Hand tremors: none  Numbness/tingling: none  Fevers: no  Chills/sweats: none  Shortness of breath: denies  Chest pain or pressure: none  Palpitations: none  Abdominal pain: no  Heart burn: no  Nausea/vomiting: no  Diarrhea/constipation: none  UTI symptoms: none  Swelling: no swelling  Sleep: sleeps ok  Pain: no pain    Good appetite; reports adequate hydration.     Functional Score: 100   Normal no complaints; no evidence of  disease.       I spent a total of 15 minutes with [name] reviewing medications and symptoms.

## 2023-05-19 NOTE — Unmapped (Signed)
Transplant Nephrology Clinic Visit    History of Present Illness  [name] is a 62 y.o. 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  -Adequate perfusion of the transplant kidney. Resistive indices within the mid main renal artery and at the anastomosis are slightly elevated. Attention on follow-up.       Subjective/Interval:     Had lung cancer screening CT in Dec 2023--no suspicious lesions, annual screening advised.    He has noticed that his HR increased to the 90s and he restarted his metoprolol on his own. HR improved and BP has been stable.          Review of Systems  Otherwise as per HPI, all other systems reviewed and are negative.    Medications  Current Outpatient Medications   Medication Sig Dispense Refill    chlorhexidine (PERIDEX) 0.12 % solution RINSE 1/2 OZ FOR 30 SECONDS THEN SPIT OUT. DO NOT RINSE WITH WATER AND DO NOT SWALLOW. USE AM AND PM      DENTA 5000 PLUS 1.1 % Crea USE DAY AND NIGHT, BRUSH THOROUGHLY FOR 2 MINS AND SPIT OUT. DO NOT SWALLOW AND RINSE LIGHTLY      magnesium oxide-Mg AA chelate (MAGNESIUM, AMINO ACID CHELATE,) 133 mg Take 1 tablet by mouth daily. 30 tablet 11    MYFORTIC 180 mg EC tablet Take 3 tablets (540 mg total) by mouth Two (2) times a day. 180 tablet 11    tacrolimus (ENVARSUS XR) 1 mg Tb24 extended release tablet Take 2 tablets (2 mg total) by mouth in the morning. 60 tablet 11     No current facility-administered medications for this visit.     Physical Exam  BP 113/65 (BP Site: L Arm, BP Position: Sitting, BP Cuff Size: Medium)  - Pulse 71  - Temp 36.3 ??C (97.3 ??F) (Temporal)  - Ht 160 cm (5' 2.99)  - Wt 62.5 kg (137 lb 12.8 oz)  - BMI 24.42 kg/m??     General: no acute distress  HEENT: mucous membranes moist  Neck: neck supple, no cervical lymphadenopathy appreciated  CV: normal rate, normal rhythm, no murmur, no gallops, no rubs appreciated  Lungs: clear to auscultation bilaterally  Abdomen: soft, non tender  Extremities:  no edema  Musculoskeletal: no visible deformity, normal range of motion.  Pulses: intact distally throughout  Neurologic: awake, alert, and oriented x3          Laboratory Data and Imaging reviewed in EPIC    Assessment: [name] is a 62 y.o. male with a past medically history significant for ESRD presumed due to hypertension but renal biopsy inconclusive now s/p DDKT  on 05/24/19.       Recommendations/Plan:   Allograft Function, status post DDKT: renal function stable with Cr 0.69 on 05/18/23 with a baseline of approximately 0.7-0.9 since transplant. Will continue to monitor.  UPC 0.147g on 05/18/23. No DSAs in Aug 2023    Immunosuppression Management [High Risk Medical Decision Making For Drug  Therapy Requiring Intensive Monitoring For Toxicity]:  Continue Envarsus 2 mg daily. Targeting tacrolimus trough level of approximately 5-7 ng/mL.  Continue Myfortic 540 mg BID.   Tacrolimus, Trough (ng/mL)   Date Value   05/18/2023 6.7   04/07/2023 9.2   03/06/2023 8.3        Blood Pressure Management: normotensive, OK to restart metoprolol 12.5 mg BID    Pre-diabetes: . Home BG overall controlled. POC HbA1c 6.5--repeat with January labs and consider adding metformin    Hypercalcemia: Last Ca normal at 9.8 on 12/6  - s/p parathyroid surgery - 11/11/2019    Gout: no flare or activity since ~2016. He has been off of allopurinol.    Lower Back Pain with midline radiculopathy: Known underlying degenerative changes. Resolved per patient.    Infectious prophylaxis and monitoring: BK PCR negative, CMV PCR negative on 05/18/23    Health Maintenance:   - Colonoscopy due 2029  - Blood work once a month    Immunizations:  Immunization History   Administered Date(s) Administered    COVID-19 VACCINE,MRNA(MODERNA)(PF) 04/12/2020, 05/10/2020, 08/15/2020, 03/17/2021    Covid-19 Vac, (26yr+) (Spikevax) Monovalent Xbb.1.5 Moder  11/11/2022    INFLUENZA INJ MDCK PF, QUAD,(FLUCELVAX)(45MO AND UP EGG FREE) 09/04/2017, 08/31/2020, 08/30/2021    INFLUENZA TIV (TRI) PF (IM) 08/17/2011, 08/08/2012, 08/05/2013, 08/28/2014, 08/11/2015, 08/29/2015    Influenza Vaccine Quad(IM)6 MO-Adult(PF) 08/16/2015, 09/06/2016, 09/11/2017, 08/06/2019, 11/07/2022    Influenza Virus Vaccine, unspecified formulation 08/30/2016, 09/18/2017, 09/04/2018, 10/02/2018    PNEUMOCOCCAL POLYSACCHARIDE 23-VALENT 07/06/2015    PPD Test 09/08/2011, 09/07/2012, 09/05/2013, 09/09/2014, 09/07/2015, 08/10/2016, 09/11/2017, 09/05/2018, 01/03/2019    Pneumococcal Conjugate 13-Valent 07/24/2010, 07/15/2015    Pneumococcal Conjugate 20-valent 08/30/2021    TdaP 12/31/2019         Return in about 6 months (around 11/18/2023).        Clelia Croft, DO  Division of Nephrology and Hypertension  [facility]  05/19/2023  2:22 PM

## 2023-05-29 LAB — FSAB CLASS 1 ANTIBODY SPECIFICITY: HLA CLASS 1 ANTIBODY RESULT: POSITIVE

## 2023-05-29 LAB — HLA DS POST TRANSPLANT
ANTI-DONOR DRW #1 MFI: 101 MFI
ANTI-DONOR DRW #2 MFI: 207 MFI
ANTI-DONOR HLA-A #2 MFI: 4 MFI
ANTI-DONOR HLA-B #1 MFI: 0 MFI
ANTI-DONOR HLA-B #2 MFI: 0 MFI
ANTI-DONOR HLA-C #1 MFI: 0 MFI
ANTI-DONOR HLA-C #2 MFI: 8 MFI
ANTI-DONOR HLA-DQB #1 MFI: 0 MFI
ANTI-DONOR HLA-DQB #2 MFI: 105 MFI
ANTI-DONOR HLA-DR #1 MFI: 127 MFI
ANTI-DONOR HLA-DR #2 MFI: 115 MFI

## 2023-05-29 LAB — FSAB CLASS 2 ANTIBODY SPECIFICITY: HLA CL2 AB RESULT: NEGATIVE

## 2023-06-01 NOTE — Unmapped (Signed)
Assessment and Plan:      Diagnosis ICD-10-CM Associated Orders   1. Low back pain radiating to left lower extremity  M54.50 lidocaine-menthol 4-1 % PtMd    M79.605       2. Left hip pain  M25.552 See plan details below       3. TMJ (temporomandibular joint disorder)  M26.609 See plan details below       4. Screening for colon cancer  Z12.11 Colonoscopy        Patient presents with left low back pain. Improving some with shoe change, exercise and topical analgesic patches prn.   Recommend continuing conservative measures. Handout provided. If pain persistent or worsening, will proceed with imaging and additional intervention which may include:  Physical therapy.  Spinal manipulation, such as by a chiropractor.  Acupuncture.  Massage.  Injections of steroid medicine in your back (especially for pain that involves your legs).      Discussed TMJ and recommendations for self management with patient.   Put an ice pack or a warm, moist cloth on your jaw for 15 minutes. Do this several times a day. Try switching back and forth between moist heat and cold.  Take an over the counter pain medicine, such as acetaminophen (Tylenol). AVOID ibuprofen (Advil, Motrin), or naproxen (Aleve). Be safe with medicines. Read and follow all instructions on the label.  Choose softer foods, such as eggs, yogurt, soup, or pureed foods. Try to avoid hard foods. Cut food into small pieces.  If it doesn't cause pain, practice relaxing your jaw. Gently open and close your mouth. Move your jaw straight up and down. Do this for a few minutes every morning and evening. Watching yourself in a mirror can help.  Have good posture. Try to line up your ears, shoulders, and hips when sitting and standing.  Learn to manage your stress. Try:  Relaxation techniques. These may include taking slow, deep breaths, and mindful meditation. It may include progressive muscle relaxation, yoga, tai chi, and qi gong.  Getting at least 30 minutes of exercise on most days to relieve stress. Try walking.  Try not to:  Hold a phone between your shoulder and your jaw.  Open your mouth all the way, like when you sing loudly or yawn.  Clench or grind your teeth, bite your lips, or chew your fingernails.  Clench things between your teeth, such as pens, pipes, or cigars.    Barriers to goals identified and addressed. Pertinent handouts were given today and reviewed with the patient as indicated.  The Care Plan and Self-Management goals have been included on the AVS and the AVS has been printed. I encouraged the patient to keep regular logs for me to review at their next visit. Any outside resources or referrals needed at this time are noted above. Patient's current medications have been reviewed. Any new medications prescribed have been discussed, and side effects have been addressed.  Have assessed the patient's understanding, response, and barriers to adherence to medications. Patient voiced understanding and all questions have been answered to satisfaction.     Return in about 4 months (around 10/10/2023) for Next scheduled follow up.    Subjective:      Louis Dennis is a 62 y.o. male being seen for a comprehensive physical exam.        Chief Complaint   Patient presents with    Hip Pain     LT hip and buttock pain x 3 months.  Sometimes the  pain goes down his leg.  Denies numbness and tingling.  No known injury.    Ear Problem     LT ear makes a noise when he moves his jaw.  No pain.  Just doesn't feel right.  Denies hearing loss.       Lifestyle:  Employment: working full time   Exercise: engages in regular exercise  Diet: well balanced   Caffeine Use: minimal to none     Patient endorses left lower back and hip pain. He noticed the tread on all his shoes was extremely worn down and uneven. Purchased all new shoes approximately 3 months ago, pain has improved significantly.   Using Salonpas large patches to left SI area. He does not use them every day, every other day, duration of 12 hours. He is also engaging in stretching exercises.   Pain occurs with walking. No pain sitting or lying in bed.   Pain radiating to left leg intermitting, not every day. No numbness or tingling.    He has not taken any oral medication for MSK pain (such as tylenol, NSAIDs contraindicated with renal disease).       HPI:  Hypertension: Patient presents for follow-up of hypertension. Blood pressure goal < 140/90.  Hypertension has customarily been at goal complicated by kidney transplant.  Home blood pressure readings: did not bring log. Salt intake and diet: salt not added to cooking and salt shaker not on table. Associated signs and symptoms: none. Patient denies: blurred vision, chest pain, dyspnea, headache, neck aches, orthopnea, palpitations, paroxysmal nocturnal dyspnea, peripheral edema, pulsating in the ears, and tiredness/fatigue. Medication compliance: taking as prescribed. He is doing regular exercise.   BP low at times, 90/55, often lower first thing in the morning.   Movement, physical activity recheck and up after that. No dizziness.       Otalgia: Patient presents for left ear symptoms for 3 months. He reports a sssh sound. Associated signs and symptoms: left jaw noise when opening and closing his jaw. He reports he feels electric shock sensation intermittently over his jaw and around his left ear with jaw movement. Sick contacts: none.  Patient notes he recently began singing for 30 minutes to 1 hour daily for the past 6 months.       PHQ-2 Score: 0       ASCVD risk:  The 10-year ASCVD risk score (Arnett DK, et al., 2019) is: 8%    Values used to calculate the score:      Age: 62 years      Sex: Male      Is Non-Hispanic African American: No      Diabetic: No      Tobacco smoker: No      Systolic Blood Pressure: 113 mmHg      Is BP treated: Yes      HDL Cholesterol: 45 mg/dL      Total Cholesterol: 148 mg/dL    Note: For patients with SBP <90 or >200, Total Cholesterol <130 or >320, HDL <20 or >100 which are outside of the allowable range, the calculator will use these upper or lower values to calculate the patient???s risk score.         ROS:     General: no fatigue, excess weight loss or gain, overall feels well  ENT: denies throat symptoms, nasal congestion. Endorses left ear symptoms. Denies otalgia.   Cardiovascular: denies chest pain, palpitations, tachycardia  Respiratory: denies, dyspnea, dyspnea on exertion, orthopnea, wheezing, cough  Gastrointestinal: denies nausea, vomiting, dyspepsia. No abdominal pain, chronic constipation or diarrhea, melena, hematochezia  Genitourinary: denies urinary difficulties, erectile dysfunction  Musculoskeletal: denies weakness. Endorses left lower back (lumbar and sacral) radiating to left hip and left leg.   Integumentary: denies rashes  or other skin problems  Neurological: denies headaches, dizziness, numbness, tingling, syncope  Psychological: denies symptoms suggesting depression, anxiety, sleep disturbance    Social History:     Social History     Tobacco Use    Smoking status: Former     Current packs/day: 0.00     Average packs/day: 1 pack/day for 30.0 years (30.0 ttl pk-yrs)     Types: Cigarettes     Start date: 07/11/1979     Quit date: 07/10/2009     Years since quitting: 13.9     Passive exposure: Never    Smokeless tobacco: Never   Vaping Use    Vaping status: Never Used   Substance Use Topics    Alcohol use: No     Alcohol/week: 0.0 standard drinks of alcohol    Drug use: No       Past Medical/Surgical History:     Past Medical History:   Diagnosis Date    Abnormal thyroid function test     End stage renal disease (CMS-HCC)     now on peritoneal dialysis    Gout     reports was in Right hand, foot and left elbow    Hypertension     takes meds intermitttently based on readings    Nephrolithiasis 2014    Visual impairment     glasses     Past Surgical History:   Procedure Laterality Date    OTHER SURGICAL HISTORY Right 08/2017    Pt had catheter moved from left side to right side.    pd catheter  2012    PR COLONOSCOPY W/BIOPSY SINGLE/MULTIPLE N/A 06/15/2018    Procedure: COLONOSCOPY, FLEXIBLE, PROXIMAL TO SPLENIC FLEXURE; WITH BIOPSY, SINGLE OR MULTIPLE;  Surgeon: Charm Rings, MD;  [facility];  Service: Gastroenterology    PR COLSC FLX W/RMVL OF TUMOR POLYP LESION SNARE TQ N/A 06/15/2018    Procedure: COLONOSCOPY FLEX; W/REMOV TUMOR/LES BY SNARE;  Surgeon: Charm Rings, MD;  Location: [facility];  Service: Gastroenterology    PR EXPLORATORY OF ABDOMEN N/A 11/22/2018    Procedure: EXPLORATORY LAPAROTOMY, EXPLORATORY CELIOTOMY WITH OR WITHOUT BIOPSY(S);  Surgeon: Lawrence Marseilles Day Thurnell Lose, MD;  Location: MAIN OR [facility];  Service: Trauma    PR EXPLORE PARATHYROID GLANDS N/A 11/11/2019    Procedure: RS 22 PARATHYROIDECTOMY OR EXPLORATION OF PARATHYROID(S);  Surgeon: Johny Chess, MD;  Location: MA[facility] OR [facility];  Service: Surgical Oncology    PR FREEING BOWEL ADHESION,ENTEROLYSIS N/A 11/22/2018    Procedure: Enterolysis (Separt Proc);  Surgeon: Lawrence Marseilles Day Thurnell Lose, MD;  Location: MA[facility] OR [facility];  Service: Trauma    PR REMOVE PERITONEAL FOREIGN BODY N/A 11/22/2018    Procedure: Removal Of Peritoneal Of Foreign Body From Peritoneal Cavity;  Surgeon: Lawrence Marseilles Day Thurnell Lose, MD;  Location: MAIN OR [facility];  Service: Trauma    PR TRANSPLANT,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    Procedure: [facility] STD PREP CAD DONR RENAL ALLOGFT PRIOR TO TRNSPLNT, INCL DISSEC/REM PERINEPH FAT, DIAPH/RTPER ATTAC;  Surgeon: Doyce Loose, MD;  Location: MAIN OR [place];  Service: Transplant    PR TRANSPLANTATION OF KIDNEY N/A 05/24/2019    Procedure: RENAL ALLOTRANSPLANTATION, IMPLANTATION OF GRAFT;  WITHOUT RECIPIENT NEPHRECTOMY;  Surgeon: Doyce Loose, MD;  Location: MAIN OR [facility];  Service: Transplant       Family History:     Family History   Problem Relation Age of Onset    Hypertension Mother     Kidney disease Mother     Diabetes Mother     Hypertension Father     Kidney disease Sister         s/p transplant    Hypertension Brother     Glaucoma Neg Hx     Amblyopia Neg Hx     Blindness Neg Hx     Retinal detachment Neg Hx     Strabismus Neg Hx     Macular degeneration Neg Hx     Basal cell carcinoma Neg Hx     Melanoma Neg Hx     Squamous cell carcinoma Neg Hx        Allergies:     Patient has no known allergies.    Current Medications:     Current Outpatient Medications   Medication Sig Dispense Refill    chlorhexidine (PERIDEX) 0.12 % solution RINSE 1/2 OZ FOR 30 SECONDS THEN SPIT OUT. DO NOT RINSE WITH WATER AND DO NOT SWALLOW. USE AM AND PM      DENTA 5000 PLUS 1.1 % Crea USE DAY AND NIGHT, BRUSH THOROUGHLY FOR 2 MINS AND SPIT OUT. DO NOT SWALLOW AND RINSE LIGHTLY      magnesium oxide-Mg AA chelate (MAGNESIUM, AMINO ACID CHELATE,) 133 mg Take 1 tablet by mouth two (2) times a day. 60 tablet 11    metoPROLOL tartrate (LOPRESSOR) 25 MG tablet Take 0.5 tablets (12.5 mg total) by mouth two (2) times a day. 30 tablet 11    MYFORTIC 180 mg EC tablet Take 3 tablets (540 mg total) by mouth Two (2) times a day. 180 tablet 11    tacrolimus (ENVARSUS XR) 1 mg Tb24 extended release tablet Take 2 tablets (2 mg total) by mouth in the morning. 60 tablet 11     No current facility-administered medications for this visit.       Health Maintenance:     Health Maintenance   Topic Date Due    Meningococcal B Vaccines (1 of 4 - Increased Risk) Never done    Lung Cancer Screening Shared Decision Making  12/01/2023    Lipid Screening  05/17/2028    Colon Cancer Screening  06/15/2028    DTaP/Tdap/Td Vaccines (2 - Td or Tdap) 12/30/2029    Pneumococcal Vaccine 0-64  Completed    Hepatitis C Screen  Completed    COVID-19 Vaccine  Completed    Influenza Vaccine  Completed    Zoster Vaccines  Discontinued       Immunizations:     Immunization History   Administered Date(s) Administered    COVID-19 VACCINE,MRNA(MODERNA)(PF) 04/12/2020, 05/10/2020, 08/15/2020, 03/17/2021    Covid-19 Vac, (46yr+) (Spikevax) Monovalent Xbb.1.5 Moder  11/11/2022    INFLUENZA INJ MDCK PF, QUAD,(FLUCELVAX)(68MO AND UP EGG FREE) 09/04/2017, 08/31/2020, 08/30/2021    INFLUENZA TIV (TRI) PF (IM) 08/17/2011, 08/08/2012, 08/05/2013, 08/28/2014, 08/11/2015, 08/29/2015    Influenza Vaccine Quad(IM)6 MO-Adult(PF) 08/16/2015, 09/06/2016, 09/11/2017, 08/06/2019, 11/07/2022    Influenza Virus Vaccine, unspecified formulation 08/30/2016, 09/18/2017, 09/04/2018, 10/02/2018    PNEUMOCOCCAL POLYSACCHARIDE 23-VALENT 07/06/2015    PPD Test 09/08/2011, 09/07/2012, 09/05/2013, 09/09/2014, 09/07/2015, 08/10/2016, 09/11/2017, 09/05/2018, 01/03/2019    Pneumococcal Conjugate 13-Valent 07/24/2010, 07/15/2015    Pneumococcal Conjugate 20-valent 08/30/2021    TdaP  12/31/2019     I have reviewed and (if needed) updated the patient's problem list, medications, allergies, past medical and surgical history, social and family history.  Vital Signs:     Wt Readings from Last 3 Encounters:   06/09/23 61.7 kg (136 lb)   05/19/23 62.5 kg (137 lb 12.8 oz)   11/11/22 64 kg (141 lb)     Temp Readings from Last 3 Encounters:   06/09/23 36.7 ??C (98 ??F) (Oral)   05/19/23 36.3 ??C (97.3 ??F) (Temporal)   11/11/22 36.3 ??C (97.3 ??F)     BP Readings from Last 3 Encounters:   06/09/23 106/72   05/19/23 113/65   11/11/22 112/61     Pulse Readings from Last 3 Encounters:   06/09/23 70   05/19/23 71   11/11/22 71     Estimated body mass index is 24.42 kg/m?? as calculated from the following:    Height as of 05/19/23: 160 cm (5' 2.99).    Weight as of 05/19/23: 62.5 kg (137 lb 12.8 oz).  No height and weight on file for this encounter.        Objective:      General Appearance: Alert, cooperative, no distress, appears stated age.  EYES: PERRL, conjunctiva/corneas clear, EOM's intact, fundi  benign, both eyes  ENT:  External canals clear, Tympanic membrane pearly grey with normal light reflex bilaterally. No oropharyngeal lesions, mucous membranes moist.  NECK: No carotid bruits.  No palpable cervical or supraclavicular lymphadenopathy. Thyroid smooth, normal size  CV: Regular rate and rhythm. Normal S1 and S2. No murmurs, gallops, or rubs  RESP: Normal respiratory effort.  Clear to auscultation bilaterally without wheezes, rhonchi or crackles.  EXT:  No lower extremity edema. Posterior tibial pulses and dorsalis pedis pulses are 2+ and symmetric.  MSK: Gait and station unremarkable. Normal ROM major joints. Normal strength and tone of proximal muscles. No pain, clicking, popping over left jaw with open/close maneuver. No visible swelling or erythema over mandible. Mild TTP over left lumbar paraspinal musculature and SI joint.   SKIN: No rashes or suspicious focal lesions noted.  LYMPH NODES: Cervical, supraclavicular, and axillary nodes normal.  NEURO: Cranial nerves II- XII grossly intact.  No focal neurologic deficits.  PSYCHIATRIC: Alert and oriented x 3. Mood normal.     Labs:     No results found for this visit on 06/09/23.      Noralyn Pick, FNP

## 2023-06-09 ENCOUNTER — Ambulatory Visit: Admit: 2023-06-09 | Discharge: 2023-06-10 | Payer: MEDICARE | Attending: Family | Primary: Family

## 2023-06-09 DIAGNOSIS — M79605 Pain in left leg: Principal | ICD-10-CM

## 2023-06-09 DIAGNOSIS — M25552 Pain in left hip: Principal | ICD-10-CM

## 2023-06-09 DIAGNOSIS — Z1211 Encounter for screening for malignant neoplasm of colon: Principal | ICD-10-CM

## 2023-06-09 DIAGNOSIS — M545 Low back pain radiating to left lower extremity: Principal | ICD-10-CM

## 2023-06-09 DIAGNOSIS — M26609 Unspecified temporomandibular joint disorder, unspecified side: Principal | ICD-10-CM

## 2023-06-09 MED ORDER — LIDOCAINE 4 %-MENTHOL 1 % TOPICAL PATCH
MEDICATED_PATCH | TOPICAL | 5 refills | 0 days | Status: CP
Start: 2023-06-09 — End: ?

## 2023-06-09 NOTE — Unmapped (Signed)
Romeo,     Pleasure seeing you today. I have attached the number to [facility] below to call and schedule your Colonoscopy. Please don't hesitate to reach out if you have any additional questions or concerns.     [facility]  [address]  [place], [place] [address]   Phone Number: [phone] option 1 for appts, then option 2 for procedures.    Take Care,   Keri

## 2023-06-12 MED ORDER — PEG 3350-ELECTROLYTES 236 GRAM-22.74 GRAM-6.74 GRAM-5.86 GRAM SOLUTION
0 refills | 0 days | Status: CP
Start: 2023-06-12 — End: ?

## 2023-06-12 NOTE — Unmapped (Signed)
[facility] Refill Coordination Note    Specialty Medication(s) to be Shipped:   Transplant: Envarsus XR 1mg  and mycophenolate mofetil 180mg     Other medication(s) to be shipped: No additional medications requested for fill at this time     Louis Dennis, DOB: 09-Oct-1961  Phone: [phone] (home)       All above HIPAA information was verified with patient.     Was a Nurse, learning disability used for this call? No    Completed refill call assessment today to schedule patient's medication shipment from the [facility] [phone]).  All relevant notes have been reviewed.     Specialty medication(s) and dose(s) confirmed: Regimen is correct and unchanged.   Changes to medications: [name] reports no changes at this time.  Changes to insurance: No  New side effects reported not previously addressed with a pharmacist or physician: None reported  Questions for the pharmacist: No    Confirmed patient received a Conservation officer, historic buildings and a Surveyor, mining with first shipment. The patient will receive a drug information handout for each medication shipped and additional FDA Medication Guides as required.       DISEASE/MEDICATION-SPECIFIC INFORMATION        N/A    SPECIALTY MEDICATION ADHERENCE     Medication Adherence    Patient reported X missed doses in the last month: 0  Specialty Medication: ENVARSUS XR 1 mg Tb24 extended release tablet (tacrolimus)  Patient is on additional specialty medications: Yes  Additional Specialty Medications: MYFORTIC 180 mg EC tablet (mycophenolate)  Patient Reported Additional Medication X Missed Doses in the Last Month: 0  Patient is on more than two specialty medications: No  Any gaps in refill history greater than 2 weeks in the last 3 months: no  Demonstrates understanding of importance of adherence: yes              Were doses missed due to medication being on hold? No    ENVARSUS XR 1   mg: 7 days of medicine on hand   MYFORTIC 180   mg: 7 days of medicine on hand       REFERRAL TO PHARMACIST     Referral to the pharmacist: Not needed      [facility]     Shipping address confirmed in Epic.       Delivery Scheduled: Yes, Expected medication delivery date: 06/16/23.     Medication will be delivered via Same Day Courier to the prescription address in Epic WAM.    Louis Dennis   [facility] Specialty Technician

## 2023-06-12 NOTE — Unmapped (Signed)
Colonoscopy  Procedure #1     Procedure #2   [id number]  MRN   Generic   Endoscopist     Is the patient's health insurance [address], [facility]), or [employer]?     Urgent procedure     Are you pregnant?     Are you in the process of scheduling or awaiting results of a heart ultrasound, stress test, or catheterization to evaluate new or worsening chest pain, dizziness, or shortness of breath?     Do you take: Plavix (clopidogrel), Coumadin (warfarin), Lovenox (enoxaparin), Pradaxa (dabigatran), Effient (prasugrel), Xarelto (rivaroxaban), Eliquis (apixaban), Pletal (cilostazol), or Brilinta (ticagrelor)?          Which of the above medications are you taking?          What is the name of the medical practice that manages this medication?          What is the name of the medical provider who manages this medication?     Do you have hemophilia, von Willebrand disease, or low platelets?     Do you have a pacemaker or implanted cardiac defibrillator?     Has a [place] [facility] provider specified the location(s)?     Which location(s) did the [facility] provider specify?        [facility]        [place]        [facility]        [facility] Sed[facility]     Is procedure indication for variceal banding (this does NOT include variceal screening)?     Have you had a heart attack, stroke or heart stent placement within the past 6 months?     Month of event     Year of event (ONLY ENTER LAST 2 DIGITS)        5  Height (feet)   3  Height (inches)   137  Weight (pounds)   24.3  BMI          Did the ordering provider specify a bowel prep?          What bowel prep was specified?     Do you have chronic kidney disease?     Do you have chronic constipation or have you had poor quality bowel preps for past colonoscopies?     Do you have Crohn's disease or ulcerative colitis?     Have you had weight loss surgery?          When you walk around your house or grocery store, do you have to stop and rest due to shortness of breath, chest pain, or light-headedness?     Do you ever use supplemental oxygen?     Have you been hospitalized for cirrhosis of the liver or heart failure in the last 12 months?     Have you been treated for mouth or throat cancer with radiation or surgery?     Have you been told that it is difficult for doctors to insert a breathing tube in you during anesthesia?     Have you had a heart or lung transplant?          Are you on dialysis?     Do you have cirrhosis of the liver?     Do you have myasthenia gravis?     Is the patient a prisoner?          Have you been diagnosed with sleep apnea or do you wear  a CPAP machine at night?     Are you younger than 30?     Have you previously received propofol sedation administered by an anesthesiologist for a GI procedure?     Do you drink an average of more than 3 drinks of alcohol per day?     Do you regularly take suboxone or any prescription medications for chronic pain?     Do you regularly take Ativan, Klonopin, Xanax, Valium, lorazepam, clonazepam, alprazolam, or diazepam?     Have you previously had difficulty with sedation during a GI procedure?     Have you been diagnosed with PTSD?     Are you allergic to fentanyl or midazolam (Versed)?     Do you take medications for HIV?   ################# ## ###################################################################################################################   MRN:          [id number]   Anticoag Review:  No   Nurse Triage:  No   GI Clinic Consult:  No   Procedure(s):  Colonoscopy     0   [facility]     [facility]     [place]     [facility]-Mod Sed   Endoscopist:  Generic    Urgent:            No   Prep:               Nulyt[place]                  ################# ## ###################################################################################################################

## 2023-06-15 ENCOUNTER — Ambulatory Visit: Admit: 2023-06-15 | Discharge: 2023-06-16 | Payer: MEDICARE

## 2023-06-15 LAB — CBC W/ AUTO DIFF
BASOPHILS ABSOLUTE COUNT: 0.1 10*9/L (ref 0.0–0.1)
BASOPHILS RELATIVE PERCENT: 1.2 %
EOSINOPHILS ABSOLUTE COUNT: 0.3 10*9/L (ref 0.0–0.5)
EOSINOPHILS RELATIVE PERCENT: 6.5 %
HEMATOCRIT: 42.1 % (ref 39.0–48.0)
HEMOGLOBIN: 14.1 g/dL (ref 12.9–16.5)
LYMPHOCYTES ABSOLUTE COUNT: 0.6 10*9/L — ABNORMAL LOW (ref 1.1–3.6)
LYMPHOCYTES RELATIVE PERCENT: 12.5 %
MEAN CORPUSCULAR HEMOGLOBIN CONC: 33.6 g/dL (ref 32.0–36.0)
MEAN CORPUSCULAR HEMOGLOBIN: 31.4 pg (ref 25.9–32.4)
MEAN CORPUSCULAR VOLUME: 93.3 fL (ref 77.6–95.7)
MEAN PLATELET VOLUME: 9.2 fL (ref 6.8–10.7)
MONOCYTES ABSOLUTE COUNT: 0.4 10*9/L (ref 0.3–0.8)
MONOCYTES RELATIVE PERCENT: 9.3 %
NEUTROPHILS ABSOLUTE COUNT: 3.2 10*9/L (ref 1.8–7.8)
NEUTROPHILS RELATIVE PERCENT: 70.5 %
NUCLEATED RED BLOOD CELLS: 0 /100{WBCs} (ref <=4)
PLATELET COUNT: 158 10*9/L (ref 150–450)
RED BLOOD CELL COUNT: 4.51 10*12/L (ref 4.26–5.60)
RED CELL DISTRIBUTION WIDTH: 14.5 % (ref 12.2–15.2)
WBC ADJUSTED: 4.5 10*9/L (ref 3.6–11.2)

## 2023-06-15 LAB — TACROLIMUS LEVEL, TROUGH: TACROLIMUS, TROUGH: 8.6 ng/mL (ref 5.0–15.0)

## 2023-06-15 LAB — URINALYSIS WITH MICROSCOPY WITH CULTURE REFLEX PERFORMABLE
BACTERIA: NONE SEEN /HPF
BILIRUBIN UA: NEGATIVE
BLOOD UA: NEGATIVE
GLUCOSE UA: NEGATIVE
KETONES UA: NEGATIVE
LEUKOCYTE ESTERASE UA: NEGATIVE
NITRITE UA: NEGATIVE
PH UA: 5.5 (ref 5.0–9.0)
PROTEIN UA: NEGATIVE
RBC UA: <1 /HPF (ref <=3)
SPECIFIC GRAVITY UA: 1.018 (ref 1.003–1.030)
SQUAMOUS EPITHELIAL: <1 /HPF (ref 0–5)
UROBILINOGEN UA: <2
WBC UA: <1 /HPF (ref <=2)

## 2023-06-15 LAB — BASIC METABOLIC PANEL
ANION GAP: 6 mmol/L (ref 5–14)
BLOOD UREA NITROGEN: 26 mg/dL — ABNORMAL HIGH (ref 9–23)
BUN / CREAT RATIO: 36
CALCIUM: 9.5 mg/dL (ref 8.7–10.4)
CHLORIDE: 108 mmol/L — ABNORMAL HIGH (ref 98–107)
CO2: 27.4 mmol/L (ref 20.0–31.0)
CREATININE: 0.73 mg/dL
EGFR CKD-EPI (2021) MALE: >90 mL/min/{1.73_m2} (ref >=60)
GLUCOSE RANDOM: 137 mg/dL (ref 70–179)
POTASSIUM: 4.9 mmol/L — ABNORMAL HIGH (ref 3.4–4.8)
SODIUM: 141 mmol/L (ref 135–145)

## 2023-06-15 LAB — PHOSPHORUS: PHOSPHORUS: 3 mg/dL (ref 2.4–5.1)

## 2023-06-15 LAB — MAGNESIUM: MAGNESIUM: 1.8 mg/dL (ref 1.6–2.6)

## 2023-06-16 MED FILL — ENVARSUS XR 1 MG TABLET,EXTENDED RELEASE: ORAL | 30 days supply | Qty: 60 | Fill #9

## 2023-06-16 MED FILL — MYFORTIC 180 MG TABLET,DELAYED RELEASE: ORAL | 30 days supply | Qty: 180 | Fill #5

## 2023-07-14 ENCOUNTER — Ambulatory Visit: Admit: 2023-07-14 | Discharge: 2023-07-15 | Payer: MEDICARE

## 2023-07-14 LAB — HEPATIC FUNCTION PANEL
ALBUMIN: 4.4 g/dL (ref 3.4–5.0)
ALKALINE PHOSPHATASE: 61 U/L (ref 46–116)
ALT (SGPT): 13 U/L (ref 10–49)
AST (SGOT): 13 U/L (ref <=34)
BILIRUBIN DIRECT: 0.4 mg/dL — ABNORMAL HIGH (ref 0.00–0.30)
BILIRUBIN TOTAL: 1 mg/dL (ref 0.3–1.2)
PROTEIN TOTAL: 6.9 g/dL (ref 5.7–8.2)

## 2023-07-14 LAB — URINALYSIS WITH MICROSCOPY WITH CULTURE REFLEX PERFORMABLE
BACTERIA: NONE SEEN /HPF
BILIRUBIN UA: NEGATIVE
BLOOD UA: NEGATIVE
GLUCOSE UA: NEGATIVE
KETONES UA: NEGATIVE
LEUKOCYTE ESTERASE UA: NEGATIVE
NITRITE UA: NEGATIVE
PH UA: 6 (ref 5.0–9.0)
PROTEIN UA: NEGATIVE
RBC UA: <1 /HPF (ref <=3)
SPECIFIC GRAVITY UA: 1.015 (ref 1.003–1.030)
SQUAMOUS EPITHELIAL: <1 /HPF (ref 0–5)
UROBILINOGEN UA: <2
WBC UA: <1 /HPF (ref <=2)

## 2023-07-14 LAB — CBC W/ AUTO DIFF
BASOPHILS ABSOLUTE COUNT: 0 10*9/L (ref 0.0–0.1)
BASOPHILS RELATIVE PERCENT: 1.1 %
EOSINOPHILS ABSOLUTE COUNT: 0.3 10*9/L (ref 0.0–0.5)
EOSINOPHILS RELATIVE PERCENT: 7.3 %
HEMATOCRIT: 42.5 % (ref 39.0–48.0)
HEMOGLOBIN: 13.8 g/dL (ref 12.9–16.5)
LYMPHOCYTES ABSOLUTE COUNT: 0.6 10*9/L — ABNORMAL LOW (ref 1.1–3.6)
LYMPHOCYTES RELATIVE PERCENT: 14.2 %
MEAN CORPUSCULAR HEMOGLOBIN CONC: 32.6 g/dL (ref 32.0–36.0)
MEAN CORPUSCULAR HEMOGLOBIN: 30.3 pg (ref 25.9–32.4)
MEAN CORPUSCULAR VOLUME: 93 fL (ref 77.6–95.7)
MEAN PLATELET VOLUME: 8.7 fL (ref 6.8–10.7)
MONOCYTES ABSOLUTE COUNT: 0.3 10*9/L (ref 0.3–0.8)
MONOCYTES RELATIVE PERCENT: 8.1 %
NEUTROPHILS ABSOLUTE COUNT: 2.8 10*9/L (ref 1.8–7.8)
NEUTROPHILS RELATIVE PERCENT: 69.3 %
NUCLEATED RED BLOOD CELLS: 0 /100{WBCs} (ref <=4)
PLATELET COUNT: 162 10*9/L (ref 150–450)
RED BLOOD CELL COUNT: 4.57 10*12/L (ref 4.26–5.60)
RED CELL DISTRIBUTION WIDTH: 14.5 % (ref 12.2–15.2)
WBC ADJUSTED: 4 10*9/L (ref 3.6–11.2)

## 2023-07-14 LAB — BASIC METABOLIC PANEL
ANION GAP: 4 mmol/L — ABNORMAL LOW (ref 5–14)
BLOOD UREA NITROGEN: 21 mg/dL (ref 9–23)
BUN / CREAT RATIO: 30
CALCIUM: 9.6 mg/dL (ref 8.7–10.4)
CHLORIDE: 109 mmol/L — ABNORMAL HIGH (ref 98–107)
CO2: 27.4 mmol/L (ref 20.0–31.0)
CREATININE: 0.69 mg/dL — ABNORMAL LOW
EGFR CKD-EPI (2021) MALE: >90 mL/min/{1.73_m2} (ref >=60)
GLUCOSE RANDOM: 134 mg/dL (ref 70–179)
POTASSIUM: 4.6 mmol/L (ref 3.4–4.8)
SODIUM: 140 mmol/L (ref 135–145)

## 2023-07-14 LAB — MAGNESIUM: MAGNESIUM: 1.9 mg/dL (ref 1.6–2.6)

## 2023-07-14 LAB — TACROLIMUS LEVEL, TROUGH: TACROLIMUS, TROUGH: 9.2 ng/mL (ref 5.0–15.0)

## 2023-07-14 LAB — PHOSPHORUS: PHOSPHORUS: 3.1 mg/dL (ref 2.4–5.1)

## 2023-07-28 NOTE — Unmapped (Signed)
[facility] Refill Coordination Note    Specialty Medication(s) to be Shipped:   Transplant: Envarsus 1mg  and Myfortic 180mg     Other medication(s) to be shipped:  magnesium,metoprolol     Louis Dennis, DOB: 1961/11/30  Phone: [phone] (home)       All above HIPAA information was verified with patient.     Was a Nurse, learning disability used for this call? No    Completed refill call assessment today to schedule patient's medication shipment from the [facility] [phone]).  All relevant notes have been reviewed.     Specialty medication(s) and dose(s) confirmed: Regimen is correct and unchanged.   Changes to medications: [name] reports no changes at this time.  Changes to insurance: No  New side effects reported not previously addressed with a pharmacist or physician: None reported  Questions for the pharmacist: No    Confirmed patient received a Conservation officer, historic buildings and a Surveyor, mining with first shipment. The patient will receive a drug information handout for each medication shipped and additional FDA Medication Guides as required.       DISEASE/MEDICATION-SPECIFIC INFORMATION        N/A    SPECIALTY MEDICATION ADHERENCE     Medication Adherence    Patient reported X missed doses in the last month: 0  Specialty Medication: ENVARSUS XR 1 mg Tb24 extended release tablet (tacrolimus)  Patient is on additional specialty medications: Yes  Additional Specialty Medications: MYFORTIC 180 mg EC tablet (mycophenolate)  Patient Reported Additional Medication X Missed Doses in the Last Month: 0  Patient is on more than two specialty medications: No  Any gaps in refill history greater than 2 weeks in the last 3 months: no  Demonstrates understanding of importance of adherence: yes  Informant: patient  Reliability of informant: reliable  Provider-estimated medication adherence level: good  Patient is at risk for Non-Adherence: No  Reasons for non-adherence: no problems identified  Confirmed plan for next specialty medication refill: delivery by pharmacy  Refills needed for supportive medications: not needed          Refill Coordination    Has the Patients' Contact Information Changed: No  Is the Shipping Address Different: No         Were doses missed due to medication being on hold? No    envarsus 1 mg: 7 days of medicine on hand   myfortic 180 mg: 7 days of medicine on hand       REFERRAL TO PHARMACIST     Referral to the pharmacist: Not needed      [facility]     Shipping address confirmed in Epic.       Delivery Scheduled: Yes, Expected medication delivery date: 08/27.     Medication will be delivered via Next Day Courier to the prescription address in Epic WAM.    Antonietta Barcelona   [facility] Specialty Technician

## 2023-07-31 MED FILL — MYFORTIC 180 MG TABLET,DELAYED RELEASE: ORAL | 30 days supply | Qty: 180 | Fill #6

## 2023-07-31 MED FILL — MG-PLUS-PROTEIN 133 MG TABLET: ORAL | 30 days supply | Qty: 60 | Fill #1

## 2023-07-31 MED FILL — ENVARSUS XR 1 MG TABLET,EXTENDED RELEASE: ORAL | 30 days supply | Qty: 60 | Fill #10

## 2023-07-31 MED FILL — METOPROLOL TARTRATE 25 MG TABLET: ORAL | 30 days supply | Qty: 30 | Fill #1

## 2023-08-09 ENCOUNTER — Ambulatory Visit: Admit: 2023-08-09 | Discharge: 2023-08-10 | Payer: MEDICARE

## 2023-08-09 LAB — CBC W/ AUTO DIFF
BASOPHILS ABSOLUTE COUNT: 0 10*9/L (ref 0.0–0.1)
BASOPHILS RELATIVE PERCENT: 0.8 %
EOSINOPHILS ABSOLUTE COUNT: 0.3 10*9/L (ref 0.0–0.5)
EOSINOPHILS RELATIVE PERCENT: 7.2 %
HEMATOCRIT: 43.6 % (ref 39.0–48.0)
HEMOGLOBIN: 14.4 g/dL (ref 12.9–16.5)
LYMPHOCYTES ABSOLUTE COUNT: 0.6 10*9/L — ABNORMAL LOW (ref 1.1–3.6)
LYMPHOCYTES RELATIVE PERCENT: 15.5 %
MEAN CORPUSCULAR HEMOGLOBIN CONC: 33 g/dL (ref 32.0–36.0)
MEAN CORPUSCULAR HEMOGLOBIN: 30.8 pg (ref 25.9–32.4)
MEAN CORPUSCULAR VOLUME: 93.1 fL (ref 77.6–95.7)
MEAN PLATELET VOLUME: 8.6 fL (ref 6.8–10.7)
MONOCYTES ABSOLUTE COUNT: 0.2 10*9/L — ABNORMAL LOW (ref 0.3–0.8)
MONOCYTES RELATIVE PERCENT: 6.3 %
NEUTROPHILS ABSOLUTE COUNT: 2.7 10*9/L (ref 1.8–7.8)
NEUTROPHILS RELATIVE PERCENT: 70.2 %
NUCLEATED RED BLOOD CELLS: 0 /100{WBCs} (ref <=4)
PLATELET COUNT: 169 10*9/L (ref 150–450)
RED BLOOD CELL COUNT: 4.68 10*12/L (ref 4.26–5.60)
RED CELL DISTRIBUTION WIDTH: 14.4 % (ref 12.2–15.2)
WBC ADJUSTED: 3.8 10*9/L (ref 3.6–11.2)

## 2023-08-09 LAB — BASIC METABOLIC PANEL
ANION GAP: 5 mmol/L (ref 5–14)
BLOOD UREA NITROGEN: 20 mg/dL (ref 9–23)
BUN / CREAT RATIO: 28
CALCIUM: 9.7 mg/dL (ref 8.7–10.4)
CHLORIDE: 108 mmol/L — ABNORMAL HIGH (ref 98–107)
CO2: 27.2 mmol/L (ref 20.0–31.0)
CREATININE: 0.72 mg/dL — ABNORMAL LOW
EGFR CKD-EPI (2021) MALE: >90 mL/min/{1.73_m2} (ref >=60)
GLUCOSE RANDOM: 120 mg/dL (ref 70–179)
POTASSIUM: 4.4 mmol/L (ref 3.4–4.8)
SODIUM: 140 mmol/L (ref 135–145)

## 2023-08-09 LAB — MAGNESIUM: MAGNESIUM: 1.9 mg/dL (ref 1.6–2.6)

## 2023-08-09 LAB — TACROLIMUS LEVEL, TROUGH: TACROLIMUS, TROUGH: 9.4 ng/mL (ref 5.0–15.0)

## 2023-08-09 LAB — PHOSPHORUS: PHOSPHORUS: 2.8 mg/dL (ref 2.4–5.1)

## 2023-09-18 NOTE — Unmapped (Signed)
[facility] Delivery Pharmacy Refill Coordination Note    Specialty Medication(s) to be Shipped:   Transplant: Envarsus 1mg  and Myfortic 180mg     Other medication(s) to be shipped:  Metoprolol, Magnesium 133mg      Louis Dennis, DOB: Apr 28, 1961  Phone: [phone] (home)       All above HIPAA information was verified with patient.     Was a Nurse, learning disability used for this call? No    Completed refill call assessment today to schedule patient's medication shipment from the [facility]  [phone]).  All relevant notes have been reviewed.     Specialty medication(s) and dose(s) confirmed: Regimen is correct and unchanged.   Changes to medications: Louis Dennis reports no changes at this time.  Changes to insurance: No  New side effects reported not previously addressed with a pharmacist or physician: None reported  Questions for the pharmacist: No    Confirmed patient received a Conservation officer, historic buildings and a Surveyor, mining with first shipment. The patient will receive a drug information handout for each medication shipped and additional FDA Medication Guides as required.       DISEASE/MEDICATION-SPECIFIC INFORMATION        N/A    SPECIALTY MEDICATION ADHERENCE     Medication Adherence    Patient reported X missed doses in the last month: 0  Specialty Medication: ENVARSUS XR 1 mg Tb24 extended release tablet (tacrolimus)  Patient is on additional specialty medications: Yes  Additional Specialty Medications: MYFORTIC 180 mg EC tablet (mycophenolate)  Patient Reported Additional Medication X Missed Doses in the Last Month: 0  Informant: patient              Were doses missed due to medication being on hold? No    Envarsus XR 1 mg: 7 days of medicine on hand   Myfortic 180 mg: 7 days of medicine on hand       REFERRAL TO PHARMACIST     Referral to the pharmacist: Not needed      [facility]     Shipping address confirmed in Epic.       Delivery Scheduled: Yes, Expected medication delivery date: 09/20/23.     Medication will be delivered via Next Day Courier to the prescription address in Epic WAM.    Louis Dennis Louis Dennis, PharmD   [facility]  Specialty Pharmacist

## 2023-09-19 MED FILL — METOPROLOL TARTRATE 25 MG TABLET: ORAL | 30 days supply | Qty: 30 | Fill #2

## 2023-09-19 MED FILL — MG-PLUS-PROTEIN 133 MG TABLET: ORAL | 30 days supply | Qty: 60 | Fill #2

## 2023-09-19 MED FILL — MYFORTIC 180 MG TABLET,DELAYED RELEASE: ORAL | 30 days supply | Qty: 180 | Fill #7

## 2023-09-19 MED FILL — ENVARSUS XR 1 MG TABLET,EXTENDED RELEASE: ORAL | 30 days supply | Qty: 60 | Fill #11

## 2023-09-20 ENCOUNTER — Ambulatory Visit: Admit: 2023-09-20 | Discharge: 2023-09-21 | Payer: MEDICARE

## 2023-09-20 LAB — URINALYSIS WITH MICROSCOPY WITH CULTURE REFLEX PERFORMABLE
BACTERIA: NONE SEEN /HPF
BILIRUBIN UA: NEGATIVE
BLOOD UA: NEGATIVE
GLUCOSE UA: NEGATIVE
KETONES UA: NEGATIVE
LEUKOCYTE ESTERASE UA: NEGATIVE
NITRITE UA: NEGATIVE
PH UA: 5 (ref 5.0–9.0)
PROTEIN UA: NEGATIVE
RBC UA: <1 /HPF (ref <=3)
SPECIFIC GRAVITY UA: 1.017 (ref 1.003–1.030)
SQUAMOUS EPITHELIAL: <1 /HPF (ref 0–5)
UROBILINOGEN UA: <2
WBC UA: 1 /HPF (ref <=2)

## 2023-09-20 LAB — CBC W/ AUTO DIFF
BASOPHILS ABSOLUTE COUNT: 0 10*9/L (ref 0.0–0.1)
BASOPHILS RELATIVE PERCENT: 0.7 %
EOSINOPHILS ABSOLUTE COUNT: 0.2 10*9/L (ref 0.0–0.5)
EOSINOPHILS RELATIVE PERCENT: 5.7 %
HEMATOCRIT: 42.7 % (ref 39.0–48.0)
HEMOGLOBIN: 14.1 g/dL (ref 12.9–16.5)
LYMPHOCYTES ABSOLUTE COUNT: 0.5 10*9/L — ABNORMAL LOW (ref 1.1–3.6)
LYMPHOCYTES RELATIVE PERCENT: 14.4 %
MEAN CORPUSCULAR HEMOGLOBIN CONC: 33 g/dL (ref 32.0–36.0)
MEAN CORPUSCULAR HEMOGLOBIN: 30.9 pg (ref 25.9–32.4)
MEAN CORPUSCULAR VOLUME: 93.7 fL (ref 77.6–95.7)
MEAN PLATELET VOLUME: 8.7 fL (ref 6.8–10.7)
MONOCYTES ABSOLUTE COUNT: 0.4 10*9/L (ref 0.3–0.8)
MONOCYTES RELATIVE PERCENT: 10.1 %
NEUTROPHILS ABSOLUTE COUNT: 2.6 10*9/L (ref 1.8–7.8)
NEUTROPHILS RELATIVE PERCENT: 69.1 %
NUCLEATED RED BLOOD CELLS: 0 /100{WBCs} (ref <=4)
PLATELET COUNT: 163 10*9/L (ref 150–450)
RED BLOOD CELL COUNT: 4.56 10*12/L (ref 4.26–5.60)
RED CELL DISTRIBUTION WIDTH: 14.7 % (ref 12.2–15.2)
WBC ADJUSTED: 3.8 10*9/L (ref 3.6–11.2)

## 2023-09-20 LAB — BASIC METABOLIC PANEL
ANION GAP: 6 mmol/L (ref 5–14)
BLOOD UREA NITROGEN: 29 mg/dL — ABNORMAL HIGH (ref 9–23)
BUN / CREAT RATIO: 36
CALCIUM: 10 mg/dL (ref 8.7–10.4)
CHLORIDE: 107 mmol/L (ref 98–107)
CO2: 28.2 mmol/L (ref 20.0–31.0)
CREATININE: 0.8 mg/dL
EGFR CKD-EPI (2021) MALE: >90 mL/min/{1.73_m2} (ref >=60)
GLUCOSE RANDOM: 130 mg/dL (ref 70–179)
POTASSIUM: 4.7 mmol/L (ref 3.4–4.8)
SODIUM: 141 mmol/L (ref 135–145)

## 2023-09-20 LAB — TACROLIMUS LEVEL, TROUGH: TACROLIMUS, TROUGH: 9.8 ng/mL (ref 5.0–15.0)

## 2023-09-20 LAB — MAGNESIUM: MAGNESIUM: 1.8 mg/dL (ref 1.6–2.6)

## 2023-09-20 LAB — PHOSPHORUS: PHOSPHORUS: 3.2 mg/dL (ref 2.4–5.1)

## 2023-09-28 DIAGNOSIS — Z940 Kidney transplant status: Principal | ICD-10-CM

## 2023-09-28 NOTE — Unmapped (Unsigned)
Assessment and Plan:     Kidney replaced by transplant  - POCT glycosylated hemoglobin (Hb A1C)    Pre-diabetes   HGB A1c 6.0 (up from 5.8 5 months ago). DM well controlled. Encouraged patient to continue carb controlled diet and regular exercise. Continue to monitor home blood sugar values and notify PCP/clinic if values rise above 130-140.   - POCT glycosylated hemoglobin (Hb A1C)    Hypertension  BP at goal (98/62 in clinic today). Continue metoprolol tartrate 12.5 mg BID. Reviewed low sodium diet and encouraged regular exercise. Advised to continue to monitor and log at-home BP readings. Contact clinic if readings become elevated.     Need for influenza vaccination  - INFLUENZA VACCINE IIV3(IM)(PF)6 MOS UP    Low back pain radiating to left lower extremity  - lidocaine (LIDODERM) 5 % patch; Place 1 patch on the skin every twelve (12) hours. Apply to affected area for 12 hours only each day (then remove patch)        I personally spent *** minutes face-to-face and non-face-to-face in the care of this patient, which includes all pre, intra, and post visit time on the date of service.    Return in about 4 months (around 02/07/2024) for Next scheduled follow up.    HPI:      Louis Dennis is here for   Chief Complaint   Patient presents with    Prediabetes    Hypertension    ESRD    Flu Vaccine     Had Influenza about a month ago.    Colon Cancer Screening     Colonoscopy scheduled this month.     Hypertension: Patient presents for follow-up of hypertension. Blood pressure goal < 140/90.  Hypertension has customarily been at goal complicated by kidney transplant.  Home blood pressure readings: did not bring log. Salt intake and diet: salt not added to cooking and salt shaker not on table. Associated signs and symptoms: none. Patient denies: blurred vision, chest pain, dyspnea, headache, neck aches, orthopnea, palpitations, paroxysmal nocturnal dyspnea, peripheral edema, pulsating in the ears, and tiredness/fatigue. Medication compliance: taking as prescribed. He is doing regular exercise.     Prediabetes: Patient presents for follow up of prediabetes.  A1C goal is <8.  Current symptoms include: none. Symptoms have been well-controlled. Patient denies foot ulcerations, hyperglycemia, hypoglycemia , increase appetite, nausea, paresthesia of the feet, polydipsia, polyuria, visual disturbances, vomitting and weight loss. Evaluation to date has included: hemoglobin A1C.  Home sugars: Between 90-120 in the morning.  Current treatment: not longer on metformin. Controlled by diet. Following with Nephrology every 4 months for management as well.      Louis Dennis states that he had the Flu in September.     Patient brings up concern of a lesion to the inside of his mouth. He states that it is minimally painful. Patient believes it could have been caused by his toothbrush. Denies history of tobacco use.    Patient notes concern of a pain to his left side when sitting in the chair. He states that the pain has improved. He believes that he may have bumped into something.      ROS:      Comprehensive 10 point ROS negative unless otherwise stated in the HPI.      [facility] Components:     Medication adherence and barriers to the treatment plan have been addressed. Opportunities to optimize healthy behaviors have been discussed. Patient / caregiver voiced understanding.  Past Medical/Surgical History:     Past Medical History:   Diagnosis Date    Abnormal thyroid function test     End stage renal disease (CMS-HCC)     now on peritoneal dialysis    Gout     reports was in Right hand, foot and left elbow    Hypertension     takes meds intermitttently based on readings    Nephrolithiasis 2014    Visual impairment     glasses     Past Surgical History:   Procedure Laterality Date    OTHER SURGICAL HISTORY Right 08/2017    Pt had catheter moved from left side to right side.    pd catheter  2012    PR COLONOSCOPY W/BIOPSY SINGLE/MULTIPLE N/A 06/15/2018 Procedure: COLONOSCOPY, FLEXIBLE, PROXIMAL TO SPLENIC FLEXURE; WITH BIOPSY, SINGLE OR MULTIPLE;  Surgeon: Charm Rings, MD;  [facility]: [facility];  Service: Gastroenterology    PR COLSC FLX W/RMVL OF TUMOR POLYP LESION SNARE TQ N/A 06/15/2018    Procedure: COLONOSCOPY FLEX; W/REMOV TUMOR/LES BY SNARE;  Surgeon: Charm Rings, MD;  Location: GI PROCEDURES [facility];  Service: Gastroenterology    PR EXPLORATORY OF ABDOMEN N/A 11/22/2018    Procedure: EXPLORATORY LAPAROTOMY, EXPLORATORY CELIOTOMY WITH OR WITHOUT BIOPSY(S);  Surgeon: Lawrence Marseilles Day Thurnell Lose, MD;  Location: MAIN OR [facility];  Service: Trauma    PR EXPLORE PARATHYROID GLANDS N/A 11/11/2019    Procedure: RS 22 PARATHYROIDECTOMY OR EXPLORATION OF PARATHYROID(S);  Surgeon: Johny Chess, MD;  Location: MAIN OR [facility];  Service: Surgical Oncology    PR FREEING BOWEL ADHESION,ENTEROLYSIS N/A 11/22/2018    Procedure: Enterolysis ([place] Proc);  Surgeon: Lawrence Marseilles Day Thurnell Lose, MD;  Location: MAIN OR [facility];  Service: Trauma    PR REMOVE PERITONEAL FOREIGN BODY N/A 11/22/2018    Procedure: Removal Of Peritoneal Of Foreign Body From Peritoneal Cavity;  Surgeon: Lawrence Marseilles Day Thurnell Lose, MD;  Location: MAIN OR [facility];  Service: Trauma    PR TRANSPLANT,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    Procedure: [facility] STD PREP CAD DONR RENAL ALLOGFT PRIOR TO TRNSPLNT, INCL DISSEC/REM PERINEPH FAT, DIAPH/RTPER ATTAC;  Surgeon: Doyce Loose, MD;  Location: MAIN OR [place];  Service: Transplant    PR TRANSPLANTATION OF KIDNEY N/A 05/24/2019    Procedure: RENAL ALLOTRANSPLANTATION, IMPLANTATION OF GRAFT; WITHOUT RECIPIENT NEPHRECTOMY;  Surgeon: Doyce Loose, MD;  Location: MAIN OR [place];  Service: Transplant       Family History:     Family History   Problem Relation Age of Onset    Hypertension Mother     Kidney disease Mother     Diabetes Mother     Hypertension Father     Kidney disease Sister         s/p transplant Hypertension Brother     Glaucoma Neg Hx     Amblyopia Neg Hx     Blindness Neg Hx     Retinal detachment Neg Hx     Strabismus Neg Hx     Macular degeneration Neg Hx     Basal cell carcinoma Neg Hx     Melanoma Neg Hx     Squamous cell carcinoma Neg Hx        Social History:     Social History     Tobacco Use    Smoking status: Former     Current packs/day: 0.00     Average packs/day: 1 pack/day for 30.0 years (30.0 ttl pk-yrs)  Types: Cigarettes     Start date: 07/11/1979     Quit date: 07/10/2009     Years since quitting: 14.2     Passive exposure: Never    Smokeless tobacco: Never   Vaping Use    Vaping status: Never Used   Substance Use Topics    Alcohol use: No     Alcohol/week: 0.0 standard drinks of alcohol    Drug use: No       Allergies:     Patient has no known allergies.    Current Medications:     Current Outpatient Medications   Medication Sig Dispense Refill    DENTA 5000 PLUS 1.1 % Crea USE DAY AND NIGHT, BRUSH THOROUGHLY FOR 2 MINS AND SPIT OUT. DO NOT SWALLOW AND RINSE LIGHTLY      magnesium oxide-Mg AA chelate (MAGNESIUM, AMINO ACID CHELATE,) 133 mg Take 1 tablet by mouth two (2) times a day. 60 tablet 11    metoPROLOL tartrate (LOPRESSOR) 25 MG tablet Take 0.5 tablets (12.5 mg total) by mouth two (2) times a day. 30 tablet 11    MYFORTIC 180 mg EC tablet Take 3 tablets (540 mg total) by mouth Two (2) times a day. 180 tablet 11    tacrolimus (ENVARSUS XR) 1 mg Tb24 extended release tablet Take 2 tablets (2 mg total) by mouth in the morning. 60 tablet 11    chlorhexidine (PERIDEX) 0.12 % solution RINSE 1/2 OZ FOR 30 SECONDS THEN SPIT OUT. DO NOT RINSE WITH WATER AND DO NOT SWALLOW. USE AM AND PM (Patient not taking: Reported on 10/10/2023)      lidocaine (LIDODERM) 5 % patch Place 1 patch on the skin every twelve (12) hours. Apply to affected area for 12 hours only each day (then remove patch) 10 patch 5    polyethylene glycol (GOLYTELY) 236-22.74-6.74 gram solution Take by mouth as directed per [facility] GI prep instructions, for split bowel prep. (Patient not taking: Reported on 10/10/2023) 4000 mL 0     No current facility-administered medications for this visit.       Health Maintenance:     Health Maintenance   Topic Date Due    Meningococcal B Vaccines (1 of 4 - Increased Risk) Never done    COVID-19 Vaccine (6 - 2024-25 season) 08/06/2023    Influenza Vaccine (1) 08/06/2023    Lung Cancer Screening Shared Decision Making  12/01/2023    Lipid Screening  05/17/2028    Colon Cancer Screening  06/15/2028    DTaP/Tdap/Td Vaccines (2 - Td or Tdap) 12/30/2029    Pneumococcal Vaccine 0-64  Completed    Hepatitis C Screen  Completed    Zoster Vaccines  Discontinued       Immunizations:     Immunization History   Administered Date(s) Administered    COVID-19 VACCINE,MRNA(MODERNA)(PF) 04/12/2020, 05/10/2020, 08/15/2020, 03/17/2021    Covid-19 Vac, (91yr+) (Spikevax) Monovalent Moderna 11/11/2022    INFLUENZA INJ MDCK PF, QUAD,(FLUCELVAX)(94MO AND UP EGG FREE) 09/04/2017, 08/31/2020, 08/30/2021    INFLUENZA TIV (TRI) PF (IM)(HISTORICAL) 08/17/2011, 08/08/2012, 08/05/2013, 08/28/2014, 08/11/2015, 08/29/2015    Influenza Vaccine Quad(IM)6 MO-Adult(PF) 08/16/2015, 09/06/2016, 09/11/2017, 08/06/2019, 11/07/2022    Influenza Virus Vaccine, unspecified formulation 08/30/2016, 09/18/2017, 09/04/2018, 10/02/2018    PNEUMOCOCCAL POLYSACCHARIDE 23-VALENT 07/06/2015    PPD Test 09/08/2011, 09/07/2012, 09/05/2013, 09/09/2014, 09/07/2015, 08/10/2016, 09/11/2017, 09/05/2018, 01/03/2019    Pneumococcal Conjugate 13-Valent 07/24/2010, 07/15/2015    Pneumococcal Conjugate 20-valent 08/30/2021    TdaP 12/31/2019     I  have reviewed and (if needed) updated the patient's problem list, medications, allergies, past medical and surgical history, social and family history.     Vital Signs:     Wt Readings from Last 3 Encounters:   10/10/23 61.7 kg (136 lb)   06/09/23 61.7 kg (136 lb)   05/19/23 62.5 kg (137 lb 12.8 oz)     Temp Readings from Last 3 Encounters:   10/10/23 36.7 ??C (98 ??F) (Oral)   06/09/23 36.7 ??C (98 ??F) (Oral)   05/19/23 36.3 ??C (97.3 ??F) (Temporal)     BP Readings from Last 3 Encounters:   10/10/23 98/62   06/09/23 106/72   05/19/23 113/65     Pulse Readings from Last 3 Encounters:   10/10/23 66   06/09/23 70   05/19/23 71     Estimated body mass index is 24.1 kg/m?? as calculated from the following:    Height as of this encounter: 160 cm (5' 2.99).    Weight as of this encounter: 61.7 kg (136 lb).  Facility age limit for growth %iles is 20 years.      Objective:      General: Alert and oriented x3. Well-appearing. No acute distress.   HEENT:  Normocephalic.  Atraumatic. Conjunctiva and sclera normal. Small white plaque located on left buccal mucosa near inner left lip commissure.  Neck:  Supple.   Heart:  Regular rate and rhythm. Normal S1, S2. No murmurs, rubs or gallops.  Lungs:  No respiratory distress.  Lungs clear to auscultation. No wheezes, rhonchi, or rales.   MSK: Localized TTP over left lateral ribs.   Skin:  Warm, dry. No rash or lesions present on visible skin.  Neuro:  Non-focal. No obvious weakness.   Psych:  Affect normal, eye contact good, speech clear and coherent.        I attest that I, Arta Bruce, personally documented this note while acting as scribe for Noralyn Pick, FNP.      Arta Bruce, Scribe.  10/10/2023     The documentation recorded by the scribe accurately reflects the service I personally performed and the decisions made by me.     Noralyn Pick, FNP

## 2023-10-10 ENCOUNTER — Ambulatory Visit: Admit: 2023-10-10 | Discharge: 2023-10-10 | Payer: MEDICARE

## 2023-10-10 ENCOUNTER — Ambulatory Visit: Admit: 2023-10-10 | Discharge: 2023-10-10 | Payer: MEDICARE | Attending: Family | Primary: Family

## 2023-10-10 DIAGNOSIS — R7303 Prediabetes: Principal | ICD-10-CM

## 2023-10-10 DIAGNOSIS — K1370 Unspecified lesions of oral mucosa: Principal | ICD-10-CM

## 2023-10-10 DIAGNOSIS — M79605 Pain in left leg: Principal | ICD-10-CM

## 2023-10-10 DIAGNOSIS — Z23 Encounter for immunization: Principal | ICD-10-CM

## 2023-10-10 DIAGNOSIS — Z940 Kidney transplant status: Principal | ICD-10-CM

## 2023-10-10 DIAGNOSIS — M545 Low back pain radiating to left lower extremity: Principal | ICD-10-CM

## 2023-10-10 LAB — BASIC METABOLIC PANEL
ANION GAP: 4 mmol/L — ABNORMAL LOW (ref 5–14)
BLOOD UREA NITROGEN: 23 mg/dL (ref 9–23)
BUN / CREAT RATIO: 31
CALCIUM: 9.7 mg/dL (ref 8.7–10.4)
CHLORIDE: 110 mmol/L — ABNORMAL HIGH (ref 98–107)
CO2: 26.9 mmol/L (ref 20.0–31.0)
CREATININE: 0.74 mg/dL
EGFR CKD-EPI (2021) MALE: >90 mL/min/{1.73_m2} (ref >=60)
GLUCOSE RANDOM: 134 mg/dL (ref 70–179)
POTASSIUM: 5.1 mmol/L — ABNORMAL HIGH (ref 3.4–4.8)
SODIUM: 141 mmol/L (ref 135–145)

## 2023-10-10 LAB — CBC W/ AUTO DIFF
BASOPHILS ABSOLUTE COUNT: 0 10*9/L (ref 0.0–0.1)
BASOPHILS RELATIVE PERCENT: 0.9 %
EOSINOPHILS ABSOLUTE COUNT: 0.2 10*9/L (ref 0.0–0.5)
EOSINOPHILS RELATIVE PERCENT: 4.4 %
HEMATOCRIT: 43.1 % (ref 39.0–48.0)
HEMOGLOBIN: 14.1 g/dL (ref 12.9–16.5)
LYMPHOCYTES ABSOLUTE COUNT: 0.5 10*9/L — ABNORMAL LOW (ref 1.1–3.6)
LYMPHOCYTES RELATIVE PERCENT: 11.8 %
MEAN CORPUSCULAR HEMOGLOBIN CONC: 32.7 g/dL (ref 32.0–36.0)
MEAN CORPUSCULAR HEMOGLOBIN: 30.7 pg (ref 25.9–32.4)
MEAN CORPUSCULAR VOLUME: 93.8 fL (ref 77.6–95.7)
MEAN PLATELET VOLUME: 9.5 fL (ref 6.8–10.7)
MONOCYTES ABSOLUTE COUNT: 0.3 10*9/L (ref 0.3–0.8)
MONOCYTES RELATIVE PERCENT: 8.2 %
NEUTROPHILS ABSOLUTE COUNT: 2.9 10*9/L (ref 1.8–7.8)
NEUTROPHILS RELATIVE PERCENT: 74.7 %
NUCLEATED RED BLOOD CELLS: 0 /100{WBCs} (ref <=4)
PLATELET COUNT: 164 10*9/L (ref 150–450)
RED BLOOD CELL COUNT: 4.6 10*12/L (ref 4.26–5.60)
RED CELL DISTRIBUTION WIDTH: 14.8 % (ref 12.2–15.2)
WBC ADJUSTED: 3.9 10*9/L (ref 3.6–11.2)

## 2023-10-10 LAB — PHOSPHORUS: PHOSPHORUS: 2.7 mg/dL (ref 2.4–5.1)

## 2023-10-10 LAB — TACROLIMUS LEVEL, TROUGH: TACROLIMUS, TROUGH: 10.3 ng/mL (ref 5.0–15.0)

## 2023-10-10 LAB — MAGNESIUM: MAGNESIUM: 1.9 mg/dL (ref 1.6–2.6)

## 2023-10-10 NOTE — Unmapped (Addendum)
Louis Dennis,     Pleasure seeing you today. I have attached the number to [facility] below to call and request information about your colonoscopy. Please don't hesitate to reach out if you have any additional questions or concerns.     [facility]  [address]  [place], [place] [address]   Phone Number: [phone]     Take Care,   Lorina Rabon

## 2023-10-13 DIAGNOSIS — Z940 Kidney transplant status: Principal | ICD-10-CM

## 2023-10-13 MED ORDER — TACROLIMUS XR 1 MG TABLET,EXTENDED RELEASE 24 HR
ORAL_TABLET | Freq: Every day | ORAL | 11 refills | 30 days | Status: CP
Start: 2023-10-13 — End: 2024-10-12
  Filled 2023-10-20: qty 30, 30d supply, fill #0

## 2023-10-13 NOTE — Unmapped (Signed)
Called patient to notify that per Dr Gwynneth Munson, patient to decrease dose to 1 mg Envarsus due to elevated Tac trough.

## 2023-10-17 NOTE — Unmapped (Addendum)
[facility] Pharmacist has reviewed a new prescription for envarsus that indicates a dose decrease.  Patient was counseled on this dosage change by coordinator AS- see epic note from 11/8.  Next refill call date adjusted if necessary.        Clinical Assessment Needed For: Dose Change  Medication: Envarsus XR 1mg  tablet  Last Fill Date/Day Supply: 09/19/2023 / 30 days  Copay $0  Was previous dose already scheduled to fill: No    Notes to Pharmacist: N/A

## 2023-10-18 NOTE — Unmapped (Signed)
[facility] Specialty and Home Delivery Pharmacy Refill Coordination Note    Specialty Medication(s) to be Shipped:   Transplant: Envarsus 1mg  and Myfortic 180mg     Other medication(s) to be shipped:  magnesium and metoprolol     [name], DOB: [birth year]  Phone: [phone] (home)       All above HIPAA information was verified with patient.     Was a Nurse, learning disability used for this call? No    Completed refill call assessment today to schedule patient's medication shipment from the [facility] and [facility] Delivery Pharmacy  [phone]).  All relevant notes have been reviewed.     Specialty medication(s) and dose(s) confirmed: Regimen is correct and unchanged.   Changes to medications: Dearl reports no changes at this time.  Changes to insurance: No  New side effects reported not previously addressed with a pharmacist or physician: None reported  Questions for the pharmacist: No    Confirmed patient received a Conservation officer, historic buildings and a Surveyor, mining with first shipment. The patient will receive a drug information handout for each medication shipped and additional FDA Medication Guides as required.       DISEASE/MEDICATION-SPECIFIC INFORMATION        N/A    SPECIALTY MEDICATION ADHERENCE     Medication Adherence    Patient reported X missed doses in the last month: 0  Specialty Medication: mycophenolate: MYFORTIC 180 mg EC tablet  Patient is on additional specialty medications: Yes  Additional Specialty Medications: tacrolimus: ENVARSUS XR 1 mg Tb24 extended release tablet  Patient Reported Additional Medication X Missed Doses in the Last Month: 0  Patient is on more than two specialty medications: No  Informant: patient              Were doses missed due to medication being on hold? No     tacrolimus: ENVARSUS XR 1 mg Tb24 extended release tablet: 2-5 days of medicine on hand    mycophenolate: MYFORTIC 180 mg EC tablet: 2-5 days of medicine on hand       REFERRAL TO PHARMACIST     Referral to the pharmacist: Not needed      [facility]     Shipping address confirmed in Epic.       Delivery Scheduled: Yes, Expected medication delivery date: 10/20/23.     Medication will be delivered via Same Day Courier to the prescription address in Epic WAM.    [name]   [facility] Special[facility] and Home Delivery Pharm[facility]  Specialty Technician

## 2023-10-20 MED FILL — METOPROLOL TARTRATE 25 MG TABLET: ORAL | 30 days supply | Qty: 30 | Fill #3

## 2023-10-20 MED FILL — MYFORTIC 180 MG TABLET,DELAYED RELEASE: ORAL | 30 days supply | Qty: 180 | Fill #8

## 2023-10-20 MED FILL — MG-PLUS-PROTEIN 133 MG TABLET: ORAL | 30 days supply | Qty: 60 | Fill #3

## 2023-10-24 ENCOUNTER — Ambulatory Visit: Admit: 2023-10-24 | Discharge: 2023-10-24 | Payer: MEDICARE

## 2023-10-24 ENCOUNTER — Encounter
Admit: 2023-10-24 | Discharge: 2023-10-24 | Payer: MEDICARE | Attending: Student in an Organized Health Care Education/Training Program | Primary: Student in an Organized Health Care Education/Training Program

## 2023-10-24 MED ADMIN — lidocaine (PF) (XYLOCAINE-MPF) 20 mg/mL (2 %) injection: INTRAVENOUS | @ 17:00:00 | Stop: 2023-10-24

## 2023-10-24 MED ADMIN — Propofol (DIPRIVAN) injection: INTRAVENOUS | @ 17:00:00 | Stop: 2023-10-24

## 2023-10-26 NOTE — Unmapped (Signed)
Path letter

## 2023-11-09 ENCOUNTER — Ambulatory Visit: Admit: 2023-11-09 | Discharge: 2023-11-10 | Payer: MEDICARE

## 2023-11-09 LAB — TACROLIMUS LEVEL, TROUGH: TACROLIMUS, TROUGH: 4.4 ng/mL — ABNORMAL LOW (ref 5.0–15.0)

## 2023-11-09 LAB — BASIC METABOLIC PANEL
ANION GAP: 12 mmol/L (ref 5–14)
BLOOD UREA NITROGEN: 22 mg/dL (ref 9–23)
BUN / CREAT RATIO: 29
CALCIUM: 9.4 mg/dL (ref 8.7–10.4)
CHLORIDE: 104 mmol/L (ref 98–107)
CO2: 24.1 mmol/L (ref 20.0–31.0)
CREATININE: 0.76 mg/dL (ref 0.73–1.18)
EGFR CKD-EPI (2021) MALE: >90 mL/min/{1.73_m2} (ref >=60)
GLUCOSE RANDOM: 123 mg/dL (ref 70–179)
POTASSIUM: 4.4 mmol/L (ref 3.4–4.8)
SODIUM: 140 mmol/L (ref 135–145)

## 2023-11-09 LAB — CBC W/ AUTO DIFF
BASOPHILS ABSOLUTE COUNT: 0 10*9/L (ref 0.0–0.1)
BASOPHILS RELATIVE PERCENT: 0.9 %
EOSINOPHILS ABSOLUTE COUNT: 0.4 10*9/L (ref 0.0–0.5)
EOSINOPHILS RELATIVE PERCENT: 8.8 %
HEMATOCRIT: 41.9 % (ref 39.0–48.0)
HEMOGLOBIN: 13.8 g/dL (ref 12.9–16.5)
LYMPHOCYTES ABSOLUTE COUNT: 0.5 10*9/L — ABNORMAL LOW (ref 1.1–3.6)
LYMPHOCYTES RELATIVE PERCENT: 11.5 %
MEAN CORPUSCULAR HEMOGLOBIN CONC: 33 g/dL (ref 32.0–36.0)
MEAN CORPUSCULAR HEMOGLOBIN: 30.9 pg (ref 25.9–32.4)
MEAN CORPUSCULAR VOLUME: 93.7 fL (ref 77.6–95.7)
MEAN PLATELET VOLUME: 8.7 fL (ref 6.8–10.7)
MONOCYTES ABSOLUTE COUNT: 0.4 10*9/L (ref 0.3–0.8)
MONOCYTES RELATIVE PERCENT: 8.5 %
NEUTROPHILS ABSOLUTE COUNT: 3.1 10*9/L (ref 1.8–7.8)
NEUTROPHILS RELATIVE PERCENT: 70.3 %
NUCLEATED RED BLOOD CELLS: 0 /100{WBCs} (ref <=4)
PLATELET COUNT: 174 10*9/L (ref 150–450)
RED BLOOD CELL COUNT: 4.47 10*12/L (ref 4.26–5.60)
RED CELL DISTRIBUTION WIDTH: 15.1 % (ref 12.2–15.2)
WBC ADJUSTED: 4.4 10*9/L (ref 3.6–11.2)

## 2023-11-09 LAB — PHOSPHORUS: PHOSPHORUS: 2.7 mg/dL (ref 2.4–5.1)

## 2023-11-09 LAB — URINALYSIS WITH MICROSCOPY WITH CULTURE REFLEX PERFORMABLE
BACTERIA: NONE SEEN /HPF
BILIRUBIN UA: NEGATIVE
BLOOD UA: NEGATIVE
GLUCOSE UA: NEGATIVE
KETONES UA: NEGATIVE
LEUKOCYTE ESTERASE UA: NEGATIVE
NITRITE UA: NEGATIVE
PH UA: 5.5 (ref 5.0–9.0)
PROTEIN UA: NEGATIVE
RBC UA: <1 /HPF (ref <=3)
SPECIFIC GRAVITY UA: 1.02 (ref 1.005–1.040)
UROBILINOGEN UA: 0.2
WBC UA: <1 /HPF (ref <=2)

## 2023-11-09 LAB — MAGNESIUM: MAGNESIUM: 1.9 mg/dL (ref 1.6–2.6)

## 2023-11-13 DIAGNOSIS — Z940 Kidney transplant status: Principal | ICD-10-CM

## 2023-11-13 NOTE — Unmapped (Addendum)
 Called patient and left detailed VM that we will increase Envarsus to 1.5 mg due to Tarolimus trough of 4.4.  Instructed patient to start taking two 0.75 mg tablets once received in the mail in place of 1 mg tablet. Advised for labs in one week.  Left call back number as well.

## 2023-11-14 DIAGNOSIS — R7989 Other specified abnormal findings of blood chemistry: Principal | ICD-10-CM

## 2023-11-14 DIAGNOSIS — R82998 Other abnormal findings in urine: Principal | ICD-10-CM

## 2023-11-14 DIAGNOSIS — Z940 Kidney transplant status: Principal | ICD-10-CM

## 2023-11-14 MED ORDER — TACROLIMUS XR 0.75 MG TABLET,EXTENDED RELEASE 24 HR
ORAL_TABLET | Freq: Every day | ORAL | 11 refills | 30.00 days | Status: CP
Start: 2023-11-14 — End: 2024-11-13
  Filled 2023-11-22: qty 60, 30d supply, fill #0

## 2023-11-17 ENCOUNTER — Ambulatory Visit: Admit: 2023-11-17 | Discharge: 2023-11-18 | Payer: MEDICARE | Attending: Nephrology | Primary: Nephrology

## 2023-11-17 DIAGNOSIS — Z79899 Other long term (current) drug therapy: Principal | ICD-10-CM

## 2023-11-17 DIAGNOSIS — D84821 Immunosuppression due to drug therapy (CMS-HCC): Principal | ICD-10-CM

## 2023-11-17 DIAGNOSIS — Z940 Kidney transplant status: Principal | ICD-10-CM

## 2023-11-17 NOTE — Unmapped (Signed)
Transplant Nephrology Clinic Visit    History of Present Illness  [name] is a 62 y.o. 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  -Adequate perfusion of the transplant kidney. Resistive indices within the mid main renal artery and at the anastomosis are slightly elevated. Attention on follow-up.       Subjective/Interval:     ]  Doing well overall  No fevers, no chills, no dizzness, no nausea, no diarrhea  He had flu shot already. He declines covid vaccine.    He did not increase envarsus to 1.5 mg as we advised on 12/10--we reinforced this today.              Review of Systems  Otherwise as per HPI, all other systems reviewed and are negative.    Medications  Current Outpatient Medications   Medication Sig Dispense Refill    chlorhexidine (PERIDEX) 0.12 % solution RINSE 1/2 OZ FOR 30 SECONDS THEN SPIT OUT. DO NOT RINSE WITH WATER AND DO NOT SWALLOW. USE AM AND PM (Patient not taking: Reported on 10/10/2023)      DENTA 5000 PLUS 1.1 % Crea USE DAY AND NIGHT, BRUSH THOROUGHLY FOR 2 MINS AND SPIT OUT. DO NOT SWALLOW AND RINSE LIGHTLY      lidocaine (LIDODERM) 5 % patch Place 1 patch on the skin every twelve (12) hours. Apply to affected area for 12 hours only each day (then remove patch) 10 patch 5    magnesium oxide-Mg AA chelate (MAGNESIUM, AMINO ACID CHELATE,) 133 mg Take 1 tablet by mouth two (2) times a day. 60 tablet 11    metoPROLOL tartrate (LOPRESSOR) 25 MG tablet Take 0.5 tablets (12.5 mg total) by mouth two (2) times a day. 30 tablet 11    MYFORTIC 180 mg EC tablet Take 3 tablets (540 mg total) by mouth Two (2) times a day. 180 tablet 11    polyethylene glycol (GOLYTELY) 236-22.74-6.74 gram solution Take by mouth as directed per [facility] GI prep instructions, for split bowel prep. (Patient not taking: Reported on 10/10/2023) 4000 mL 0    tacrolimus (ENVARSUS XR) 0.75 mg Tb24 extended release tablet Take 2 tablets (1.5 mg total) by mouth daily. 60 tablet 11     No current facility-administered medications for this visit.     Physical Exam  BP 115/75 (BP Site: L Arm, BP Position: Sitting, BP Cuff Size: Medium)  - Pulse 82  - Temp 36.7 ??C (98.1 ??F) (Temporal)  - Wt 64.4 kg (142 lb)  - BMI 25.15 kg/m??     General: no acute distress  HEENT: mucous membranes moist  Neck: neck supple, no cervical lymphadenopathy  appreciated  CV: normal rate, normal rhythm, no murmur, no gallops, no rubs appreciated  Lungs: clear to auscultation bilaterally  Abdomen: soft, non tender  Extremities:  no edema  Musculoskeletal: no visible deformity, normal range of motion.  Pulses: intact distally throughout  Neurologic: awake, alert, and oriented x3          Laboratory Data and Imaging reviewed in EPIC    Assessment: [name] is a 62 y.o. 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       Recommendations/Plan:   Allograft Function, status post DDKT: renal function stable with Cr 0.76 on 11/09/23 with a baseline of approximately 0.7-0.9 since transplant. Will continue to monitor.  UPC 0.147g on 05/18/23, UA normal today. No DSAs in June 2024    Immunosuppression Management [High Risk Medical Decision Making For Drug  Therapy Requiring Intensive Monitoring For Toxicity]:  Continue Envarsus 1.5 mg daily. Targeting tacrolimus trough level of approximately 5-7 ng/mL.  Continue Myfortic 540 mg BID.   Tacrolimus, Trough (ng/mL)   Date Value   11/09/2023 4.4 (L)   10/10/2023 10.3   09/20/2023 9.8        Blood Pressure Management: normotensive, OK to continue metoprolol 12.5 mg BID    Pre-diabetes: . Home BG overall controlled. POC HbA1c 6.5--repeat with January labs and consider adding metformin    Hypercalcemia: Last Ca normal at 9.8 on 12/6  - s/p parathyroid surgery - 11/11/2019    Gout: no flare or activity since ~2016. He has been off of allopurinol.    Lower Back Pain with midline radiculopathy: Known underlying degenerative changes. Resolved per patient.    Infectious prophylaxis and monitoring: BK PCR negative, CMV PCR negative on 05/18/23    Health Maintenance:   - Colonoscopy due 2029  - Blood work once a month  -Covid vaccine today    Immunizations:  Immunization History   Administered Date(s) Administered    COVID-19 VACCINE,MRNA(MODERNA)(PF) 04/12/2020, 05/10/2020, 08/15/2020, 03/17/2021    Covid-19 Vac, (64yr+) (Spikevax) Monovalent Moderna 11/11/2022    INFLUENZA INJ MDCK PF, QUAD,(FLUCELVAX)(41MO AND UP EGG FREE) 09/04/2017, 08/31/2020, 08/30/2021    INFLUENZA TIV (TRI) PF (IM)(HISTORICAL) 08/17/2011, 08/08/2012, 08/05/2013, 08/28/2014, 08/11/2015, 08/29/2015    INFLUENZA VACCINE IIV3(IM)(PF)6 MOS UP 10/10/2023    Influenza Vaccine Quad(IM)6 MO-Adult(PF) 08/16/2015, 09/06/2016, 09/11/2017, 08/06/2019, 11/07/2022    Influenza Virus Vaccine, unspecified formulation 08/30/2016, 09/18/2017, 09/04/2018, 10/02/2018    PNEUMOCOCCAL POLYSACCHARIDE 23-VALENT 07/06/2015    PPD Test 09/08/2011, 09/07/2012, 09/05/2013, 09/09/2014, 09/07/2015, 08/10/2016, 09/11/2017, 09/05/2018, 01/03/2019    Pneumococcal Conjugate 13-Valent 07/24/2010, 07/15/2015    Pneumococcal Conjugate 20-valent 08/30/2021    TdaP 12/31/2019         Return in about 6 months (around 05/17/2024).        Clelia Croft, DO  Division of Nephrology and Hypertension  [facility]  11/17/2023  2:33 PM

## 2023-11-17 NOTE — Unmapped (Unsigned)
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          11/17/23 1355   BP: 115/75   Pulse: 82   Temp: 36.7 ??C (98.1 ??F)   Weight: 64.4 kg (142 lb)   PainSc: 0-No pain       Assessment  BP: ***  Lightheaded: ***  BG: ***  Headache: ***  Hand tremors: ***  Numbness/tingling: ***  Fevers: ***  Chills/sweats: ***  Shortness of breath: ***  Chest pain or pressure: ***  Palpitations: ***  Abdominal pain: ***  Heart burn: ***  Nausea/vomiting: ***  Diarrhea/constipation: ***  UTI symptoms: ***  Swelling: ***  Sleep: ***  Pain: ***  Incision: ***  Drain: ***  Foley: ***    Good appetite; reports adequate hydration.     Intake: ***  Output: ***    Any new medications? ***  Immunosuppressant last taken: ***    Immunization status: ***    Functional Score: {ljykarnofsky:57573}  Employment status is: {Social History Employment Status:27170}    I spent a total of *** minutes with [employer] reviewing medications and symptoms.

## 2023-11-17 NOTE — Unmapped (Signed)
When you get the 0.75 mg Envarsus tabs, please start taking two of them once daily for a new dose of 1.5 mg daily.

## 2023-11-17 NOTE — Unmapped (Signed)
[place] Interpreter ID [phone]

## 2023-11-18 NOTE — Unmapped (Signed)
Clinical Assessment Needed For: Dose Change  Medication: Envarsus XR 0.75mg  tablet  Last Fill Date/Day Supply: 09/06/2022 / 30 days  Copay $0  Was previous dose already scheduled to fill: No    Notes to Pharmacist: Previously only taking 1mg  tablets, LF 10/20/2023 for 30ds

## 2023-11-21 NOTE — Unmapped (Signed)
[facility] Specialty and Home Delivery Pharmacy Refill Coordination Note    Specialty Medication(s) to be Shipped:   Transplant: Envarsus 0.75mg  and mycophenolate mofetil 180mg     Other medication(s) to be shipped: No additional medications requested for fill at this time     Louis Dennis, DOB: August 10, 1961  Phone: [phone] (home)       All above HIPAA information was verified with patient.     Was a Nurse, learning disability used for this call? No    Completed refill call assessment today to schedule patient's medication shipment from the [facility] and Home Delivery Pharmacy  [phone]).  All relevant notes have been reviewed.     Specialty medication(s) and dose(s) confirmed: Regimen is correct and unchanged.   Changes to medications: Louis Dennis reports no changes at this time.  Changes to insurance: No  New side effects reported not previously addressed with a pharmacist or physician: None reported  Questions for the pharmacist: No    Confirmed patient received a Conservation officer, historic buildings and a Surveyor, mining with first shipment. The patient will receive a drug information handout for each medication shipped and additional FDA Medication Guides as required.       DISEASE/MEDICATION-SPECIFIC INFORMATION        N/A    SPECIALTY MEDICATION ADHERENCE     Medication Adherence    Patient reported X missed doses in the last month: 0  Specialty Medication: mycophenolate: MYFORTIC 180 mg EC tablet  Patient is on additional specialty medications: Yes  Additional Specialty Medications: tacrolimus: ENVARSUS XR 1 mg Tb24 extended release tablet  Patient Reported Additional Medication X Missed Doses in the Last Month: 0  Patient is on more than two specialty medications: No  Informant: patient              Were doses missed due to medication being on hold? No     tacrolimus: ENVARSUS XR 0.75 mg Tb24 extended release tablet: 0 days of medicine on hand    mycophenolate: MYFORTIC 180 mg EC tablet: 7 days of medicine on hand       REFERRAL TO PHARMACIST     Referral to the pharmacist: Not needed      [facility]     Shipping address confirmed in Epic.       Delivery Scheduled: Yes, Expected medication delivery date: 11/22/23.     Medication will be delivered via Same Day Courier to the prescription address in Epic WAM.    [name]   [facility] Specialty and Home Delivery Pharmacy  Specialty Technician

## 2023-11-22 MED FILL — MYFORTIC 180 MG TABLET,DELAYED RELEASE: ORAL | 30 days supply | Qty: 180 | Fill #9

## 2023-11-22 NOTE — Unmapped (Signed)
12/18: patient's wife is aware envarsus/myfortic will be delivered via our courier today (already set up with another team member). Denied any other refills at this time, however she will have him call us back if needs anything else/any questions-ef    [facility] Delivery Pharmacy Clinical Assessment & Refill Coordination Note    Jeri Seever, DOB: [birth year]  Phone: [phone] (home)     All above HIPAA information was verified with patient's family member, wife.     Was a Nurse, learning disability used for this call?  Declined interpreter today    Specialty Medication(s):   Transplant: Envarsus 0.75mg  and Myfortic 180mg      Current Outpatient Medications   Medication Sig Dispense Refill    chlorhexidine (PERIDEX) 0.12 % solution RINSE 1/2 OZ FOR 30 SECONDS THEN SPIT OUT. DO NOT RINSE WITH WATER AND DO NOT SWALLOW. USE AM AND PM (Patient not taking: Reported on 10/10/2023)      DENTA 5000 PLUS 1.1 % Crea USE DAY AND NIGHT, BRUSH THOROUGHLY FOR 2 MINS AND SPIT OUT. DO NOT SWALLOW AND RINSE LIGHTLY      lidocaine (LIDODERM) 5 % patch Place 1 patch on the skin every twelve (12) hours. Apply to affected area for 12 hours only each day (then remove patch) 10 patch 5    magnesium oxide-Mg AA chelate (MAGNESIUM, AMINO ACID CHELATE,) 133 mg Take 1 tablet by mouth two (2) times a day. 60 tablet 11    metoPROLOL tartrate (LOPRESSOR) 25 MG tablet Take 0.5 tablets (12.5 mg total) by mouth two (2) times a day. 30 tablet 11    MYFORTIC 180 mg EC tablet Take 3 tablets (540 mg total) by mouth Two (2) times a day. 180 tablet 11    polyethylene glycol (GOLYTELY) 236-22.74-6.74 gram solution Take by mouth as directed per [facility] GI prep instructions, for split bowel prep. (Patient not taking: Reported on 10/10/2023) 4000 mL 0    tacrolimus (ENVARSUS XR) 0.75 mg Tb24 extended release tablet Take 2 tablets (1.5 mg total) by mouth daily. 60 tablet 11     No current facility-administered medications for this visit.        Changes to medications: Florian reports no changes at this time.    No Known Allergies    Changes to allergies: No    SPECIALTY MEDICATION ADHERENCE     Envarsus 0.75mg   : 4 days of medicine on hand   Myfortic 180mg   : 4 days of medicine on hand       Medication Adherence    Patient reported X missed doses in the last month: 0  Specialty Medication: myfortic 180mg   Patient is on additional specialty medications: Yes  Additional Specialty Medications: Envarsus 0.75mg   Patient Reported Additional Medication X Missed Doses in the Last Month: 0  Patient is on more than two specialty medications: No          Specialty medication(s) dose(s) confirmed: Regimen is correct and unchanged.     Are there any concerns with adherence? No    Adherence counseling provided? Not needed    CLINICAL MANAGEMENT AND INTERVENTION      Clinical Benefit Assessment:    Do you feel the medicine is effective or helping your condition? Yes    Clinical Benefit counseling provided? Not needed    Adverse Effects Assessment:    Are you experiencing any side effects? No    Are you experiencing difficulty administering your medicine? No    Quality of Life Assessment:  Quality of Life    Rheumatology  Oncology  Dermatology  Cystic Fibrosis          How many days over the past month did your transplant  keep you from your normal activities? For example, brushing your teeth or getting up in the morning. 0    Have you discussed this with your provider? Not needed    Acute Infection Status:    Acute infections noted within Epic:  No active infections  Patient reported infection: None    Therapy Appropriateness:    Is therapy appropriate based on current medication list, adverse reactions, adherence, clinical benefit and progress toward achieving therapeutic goals? Yes, therapy is appropriate and should be continued     DISEASE/MEDICATION-SPECIFIC INFORMATION      N/A    Solid Organ Transplant: Not Applicable    PATIENT SPECIFIC NEEDS     Does the patient have any physical, cognitive, or cultural barriers? No    Is the patient high risk? Yes, patient is taking a REMS drug. Medication is dispensed in compliance with REMS program    Did the patient require a clinical intervention? No    Does the patient require physician intervention or other additional services (i.e., nutrition, smoking cessation, social work)? No    SOCIAL DETERMINANTS OF HEALTH     At the [facility], we have learned that life circumstances - like trouble affording food, housing, utilities, or transportation can affect the health of many of our patients.   That is why we wanted to ask: are you currently experiencing any life circumstances that are negatively impacting your health and/or quality of life? Patient declined to answer    Social Drivers of Health     Food Insecurity: No Food Insecurity (06/09/2023)    Hunger Vital Sign     Worried About Running Out of Food in the Last Year: Never true     Ran Out of Food in the Last Year: Never true   Internet Connectivity: Not on file   Housing/Utilities: Low Risk  (06/09/2023)    Housing/Utilities     Within the past 12 months, have you ever stayed: outside, in a car, in a tent, in an overnight shelter, or temporarily in someone else's home (i.e. couch-surfing)?: No     Are you worried about losing your housing?: No     Within the past 12 months, have you been unable to get utilities (heat, electricity) when it was really needed?: No   Tobacco Use: Medium Risk (11/17/2023)    Patient History     Smoking Tobacco Use: Former     Smokeless Tobacco Use: Never     Passive Exposure: Never   Transportation Needs: No Transportation Needs (06/09/2023)    PRAPARE - Therapist, art (Medical): No     Lack of Transportation (Non-Medical): No   Alcohol Use: Not At Risk (06/09/2023)    Alcohol Use     How often do you have a drink containing alcohol?: Never     How many drinks containing alcohol do you have on a typical day when you are drinking?: Not on file How often do you have 5 or more drinks on one occasion?: Never   Interpersonal Safety: Not on file   Physical Activity: Sufficiently Active (05/27/2019)    Exercise Vital Sign     Days of Exercise per Week: 7 days     Minutes of Exercise per Session: 90 min  Intimate Partner Violence: Not At Risk (05/27/2019)    Humiliation, Afraid, Rape, and Kick questionnaire     Fear of Current or Ex-Partner: No     Emotionally Abused: No     Physically Abused: No     Sexually Abused: No   Stress: No Stress Concern Present (05/27/2019)    [employer] of Occupational Health - Occupational Stress Questionnaire     Feeling of Stress : Only a little   Substance Use: Low Risk  (06/09/2023)    Substance Use     In the past year, how often have you used prescription drugs for non-medical reasons?: Never     In the past year, how often have you used illegal drugs?: Never     In the past year, have you used any substance for non-medical reasons?: No   Social Connections: Socially Integrated (05/27/2019)    Social Connection and Isolation Panel [NHANES]     Frequency of Communication with Friends and Family: More than three times a week     Frequency of Social Gatherings with Friends and Family: More than three times a week     Attends Religious Services: More than 4 times per year     Active Member of [employer] or Organizations: Yes     Attends Engineer, structural: More than 4 times per year     Marital Status: Married   Physicist, medical Strain: Low Risk  (06/09/2023)    Overall Financial Resource Strain (CARDIA)     Difficulty of Paying Living Expenses: Not hard at all   Depression: Not at risk (06/09/2023)    PHQ-2     PHQ-2 Score: 0   Health Literacy: High Risk (05/07/2021)    Health Literacy     : Often       Would you be willing to receive help with any of the needs that you have identified today? Not applicable       SHIPPING     Specialty Medication(s) to be Shipped:   N/a    Other medication(s) to be shipped: No additional medications requested for fill at this time     Changes to insurance: No    Delivery Scheduled: Patient declined refill at this time due to delivery already set up by another team member, wife will have patient contact us if needs any other meds refills/has any questions.     Medication will be delivered via  n/a  to the confirmed  n/a  address in [facility].    The patient will receive a drug information handout for each medication shipped and additional FDA Medication Guides as required.  Verified that patient has previously received a Conservation officer, historic buildings and a Surveyor, mining.    The patient or caregiver noted above participated in the development of this care plan and knows that they can request review of or adjustments to the care plan at any time.      All of the patient's questions and concerns have been addressed.    Thad Ranger, PharmD   [facility] Specialty and Home Delivery Pharmacy Specialty Pharmacist

## 2023-12-08 ENCOUNTER — Ambulatory Visit: Admit: 2023-12-08 | Discharge: 2023-12-09 | Payer: MEDICARE

## 2023-12-08 LAB — CBC W/ AUTO DIFF
BASOPHILS ABSOLUTE COUNT: 0.1 10*9/L (ref 0.0–0.1)
BASOPHILS RELATIVE PERCENT: 1.5 %
EOSINOPHILS ABSOLUTE COUNT: 0.4 10*9/L (ref 0.0–0.5)
EOSINOPHILS RELATIVE PERCENT: 9.3 %
HEMATOCRIT: 43.9 % (ref 39.0–48.0)
HEMOGLOBIN: 14.4 g/dL (ref 12.9–16.5)
LYMPHOCYTES ABSOLUTE COUNT: 0.5 10*9/L — ABNORMAL LOW (ref 1.1–3.6)
LYMPHOCYTES RELATIVE PERCENT: 11.5 %
MEAN CORPUSCULAR HEMOGLOBIN CONC: 32.8 g/dL (ref 32.0–36.0)
MEAN CORPUSCULAR HEMOGLOBIN: 30.8 pg (ref 25.9–32.4)
MEAN CORPUSCULAR VOLUME: 93.7 fL (ref 77.6–95.7)
MEAN PLATELET VOLUME: 9 fL (ref 6.8–10.7)
MONOCYTES ABSOLUTE COUNT: 0.5 10*9/L (ref 0.3–0.8)
MONOCYTES RELATIVE PERCENT: 11.7 %
NEUTROPHILS ABSOLUTE COUNT: 2.8 10*9/L (ref 1.8–7.8)
NEUTROPHILS RELATIVE PERCENT: 66 %
NUCLEATED RED BLOOD CELLS: 0 /100{WBCs} (ref <=4)
PLATELET COUNT: 157 10*9/L (ref 150–450)
RED BLOOD CELL COUNT: 4.69 10*12/L (ref 4.26–5.60)
RED CELL DISTRIBUTION WIDTH: 14.4 % (ref 12.2–15.2)
WBC ADJUSTED: 4.2 10*9/L (ref 3.6–11.2)

## 2023-12-08 LAB — BASIC METABOLIC PANEL
ANION GAP: 11 mmol/L (ref 5–14)
BLOOD UREA NITROGEN: 19 mg/dL (ref 9–23)
BUN / CREAT RATIO: 24
CALCIUM: 9.3 mg/dL (ref 8.7–10.4)
CHLORIDE: 105 mmol/L (ref 98–107)
CO2: 26.2 mmol/L (ref 20.0–31.0)
CREATININE: 0.78 mg/dL (ref 0.73–1.18)
EGFR CKD-EPI (2021) MALE: >90 mL/min/{1.73_m2} (ref >=60)
GLUCOSE RANDOM: 152 mg/dL (ref 70–179)
POTASSIUM: 4.4 mmol/L (ref 3.4–4.8)
SODIUM: 142 mmol/L (ref 135–145)

## 2023-12-08 LAB — MAGNESIUM: MAGNESIUM: 1.9 mg/dL (ref 1.6–2.6)

## 2023-12-08 LAB — PHOSPHORUS: PHOSPHORUS: 2.8 mg/dL (ref 2.4–5.1)

## 2023-12-08 LAB — TACROLIMUS LEVEL, TROUGH: TACROLIMUS, TROUGH: 4.7 ng/mL — ABNORMAL LOW (ref 5.0–15.0)

## 2023-12-15 NOTE — Unmapped (Signed)
[facility] Specialty and Home Delivery Pharmacy Refill Coordination Note    Specialty Medication(s) to be Shipped:   Transplant: Envarsus 0.75mg  and Myfortic 180 mg    Other medication(s) to be shipped:  metoprolol      Louis Dennis, DOB: 04/04/1961  Phone: [phone] (home)       All above HIPAA information was verified with patient.     Was a Nurse, learning disability used for this call? No    Completed refill call assessment today to schedule patient's medication shipment from the [facility] and Home Delivery Pharmacy  [phone]).  All relevant notes have been reviewed.     Specialty medication(s) and dose(s) confirmed: Regimen is correct and unchanged.   Changes to medications: [name] reports no changes at this time.  Changes to insurance: No  New side effects reported not previously addressed with a pharmacist or physician: None reported  Questions for the pharmacist: No    Confirmed patient received a Conservation officer, historic buildings and a Surveyor, mining with first shipment. The patient will receive a drug information handout for each medication shipped and additional FDA Medication Guides as required.       DISEASE/MEDICATION-SPECIFIC INFORMATION        N/A    SPECIALTY MEDICATION ADHERENCE     Medication Adherence    Patient reported X missed doses in the last month: 0  Specialty Medication: ENVARSUS XR 0.75 mg Tb24 extended release tablet (tacrolimus)  Patient is on additional specialty medications: Yes  Additional Specialty Medications: MYFORTIC 180 mg EC tablet (mycophenolate)  Patient Reported Additional Medication X Missed Doses in the Last Month: 0  Patient is on more than two specialty medications: No              Were doses missed due to medication being on hold? No    Envarsus 0.75 mg: 5 days of medicine on hand   Myfortic 180 mg: 5 days of medicine on hand       REFERRAL TO PHARMACIST     Referral to the pharmacist: Not needed      [facility]     Shipping address confirmed in Epic.       Delivery Scheduled: Yes, Expected medication delivery date: 12/18/2023.     Medication will be delivered via Same Day Courier to the prescription address in Epic [place].    [name]   [facility] Specialty and Home Delivery Pharmacy  Specialty Technician

## 2023-12-18 MED FILL — ENVARSUS XR 0.75 MG TABLET,EXTENDED RELEASE: ORAL | 30 days supply | Qty: 60 | Fill #1

## 2023-12-18 MED FILL — METOPROLOL TARTRATE 25 MG TABLET: ORAL | 30 days supply | Qty: 30 | Fill #4

## 2023-12-18 MED FILL — MG-PLUS-PROTEIN 133 MG TABLET: ORAL | 30 days supply | Qty: 60 | Fill #4

## 2023-12-18 MED FILL — MYFORTIC 180 MG TABLET,DELAYED RELEASE: ORAL | 30 days supply | Qty: 180 | Fill #10

## 2023-12-22 DIAGNOSIS — Z940 Kidney transplant status: Principal | ICD-10-CM

## 2024-01-15 ENCOUNTER — Ambulatory Visit: Admit: 2024-01-15 | Discharge: 2024-01-16 | Payer: MEDICARE

## 2024-01-15 DIAGNOSIS — Z940 Kidney transplant status: Principal | ICD-10-CM

## 2024-01-15 LAB — BASIC METABOLIC PANEL
ANION GAP: 12 mmol/L (ref 5–14)
BLOOD UREA NITROGEN: 20 mg/dL (ref 9–23)
BUN / CREAT RATIO: 26
CALCIUM: 9.6 mg/dL (ref 8.7–10.4)
CHLORIDE: 103 mmol/L (ref 98–107)
CO2: 26.4 mmol/L (ref 20.0–31.0)
CREATININE: 0.76 mg/dL (ref 0.73–1.18)
EGFR CKD-EPI (2021) MALE: >90 mL/min/{1.73_m2} (ref >=60)
GLUCOSE RANDOM: 142 mg/dL (ref 70–179)
POTASSIUM: 4.3 mmol/L (ref 3.4–4.8)
SODIUM: 141 mmol/L (ref 135–145)

## 2024-01-15 LAB — CBC W/ AUTO DIFF
BASOPHILS ABSOLUTE COUNT: 0 10*9/L (ref 0.0–0.1)
BASOPHILS RELATIVE PERCENT: 1.2 %
EOSINOPHILS ABSOLUTE COUNT: 0.2 10*9/L (ref 0.0–0.5)
EOSINOPHILS RELATIVE PERCENT: 6 %
HEMATOCRIT: 45.5 % (ref 39.0–48.0)
HEMOGLOBIN: 15 g/dL (ref 12.9–16.5)
LYMPHOCYTES ABSOLUTE COUNT: 0.6 10*9/L — ABNORMAL LOW (ref 1.1–3.6)
LYMPHOCYTES RELATIVE PERCENT: 13.5 %
MEAN CORPUSCULAR HEMOGLOBIN CONC: 33 g/dL (ref 32.0–36.0)
MEAN CORPUSCULAR HEMOGLOBIN: 30.6 pg (ref 25.9–32.4)
MEAN CORPUSCULAR VOLUME: 92.8 fL (ref 77.6–95.7)
MEAN PLATELET VOLUME: 8.6 fL (ref 6.8–10.7)
MONOCYTES ABSOLUTE COUNT: 0.3 10*9/L (ref 0.3–0.8)
MONOCYTES RELATIVE PERCENT: 6.9 %
NEUTROPHILS ABSOLUTE COUNT: 3 10*9/L (ref 1.8–7.8)
NEUTROPHILS RELATIVE PERCENT: 72.4 %
NUCLEATED RED BLOOD CELLS: 0 /100{WBCs} (ref <=4)
PLATELET COUNT: 162 10*9/L (ref 150–450)
RED BLOOD CELL COUNT: 4.91 10*12/L (ref 4.26–5.60)
RED CELL DISTRIBUTION WIDTH: 14.3 % (ref 12.2–15.2)
WBC ADJUSTED: 4.1 10*9/L (ref 3.6–11.2)

## 2024-01-15 LAB — URINALYSIS WITH MICROSCOPY WITH CULTURE REFLEX PERFORMABLE
BACTERIA: NONE SEEN /HPF
BILIRUBIN UA: NEGATIVE
BLOOD UA: NEGATIVE
GLUCOSE UA: NEGATIVE
KETONES UA: NEGATIVE
LEUKOCYTE ESTERASE UA: NEGATIVE
NITRITE UA: NEGATIVE
PH UA: 5.5 (ref 5.0–9.0)
PROTEIN UA: NEGATIVE
RBC UA: <1 /HPF (ref <=3)
SPECIFIC GRAVITY UA: 1.017 (ref 1.003–1.030)
SQUAMOUS EPITHELIAL: <1 /HPF (ref 0–5)
UROBILINOGEN UA: <2
WBC UA: 1 /HPF (ref <=2)

## 2024-01-15 LAB — MAGNESIUM: MAGNESIUM: 2 mg/dL (ref 1.6–2.6)

## 2024-01-15 LAB — TACROLIMUS LEVEL, TROUGH: TACROLIMUS, TROUGH: 7 ng/mL (ref 5.0–15.0)

## 2024-01-15 LAB — PHOSPHORUS: PHOSPHORUS: 2.8 mg/dL (ref 2.4–5.1)

## 2024-01-15 MED ORDER — MYFORTIC 180 MG TABLET,DELAYED RELEASE
ORAL_TABLET | Freq: Two times a day (BID) | ORAL | 11 refills | 30.00 days
Start: 2024-01-15 — End: 2025-01-14

## 2024-01-15 NOTE — Unmapped (Signed)
[facility] Specialty and Home Delivery Pharmacy Refill Coordination Note    Specialty Medication(s) to be Shipped:   Transplant: Envarsus 0.75mg  and Myfortic 180mg     Other medication(s) to be shipped:  magnesium and metoprolol     Louis Dennis, DOB: Jan 07, 1961  Phone: [phone] (home)       All above HIPAA information was verified with patient.     Was a Nurse, learning disability used for this call? No    Completed refill call assessment today to schedule patient's medication shipment from the [facility] and Home Delivery Pharmacy  [phone]).  All relevant notes have been reviewed.     Specialty medication(s) and dose(s) confirmed: Regimen is correct and unchanged.   Changes to medications: Almon reports no changes at this time.  Changes to insurance: No  New side effects reported not previously addressed with a pharmacist or physician: None reported  Questions for the pharmacist: No    Confirmed patient received a Conservation officer, historic buildings and a Surveyor, mining with first shipment. The patient will receive a drug information handout for each medication shipped and additional FDA Medication Guides as required.       DISEASE/MEDICATION-SPECIFIC INFORMATION        N/A    SPECIALTY MEDICATION ADHERENCE     Medication Adherence    Patient reported X missed doses in the last month: 0  Specialty Medication: ENVARSUS XR 0.75 mg Tb24 extended release tablet (tacrolimus)  Patient is on additional specialty medications: Yes  Additional Specialty Medications: MYFORTIC 180 mg EC tablet (mycophenolate)  Patient is on more than two specialty medications: No  Any gaps in refill history greater than 2 weeks in the last 3 months: no  Demonstrates understanding of importance of adherence: yes  Informant: patient  Confirmed plan for next specialty medication refill: delivery by pharmacy  Refills needed for supportive medications: yes, ordered or provider notified          Refill Coordination    Has the Patients' Contact Information Changed: No  Is the Shipping Address Different: No         Were doses missed due to medication being on hold? No    ENVARSUS XR 0.75 mg: 3 days of medicine on hand   MYFORTIC 180  mg: 3 days of medicine on hand       REFERRAL TO PHARMACIST     Referral to the pharmacist: Not needed      [facility]     Shipping address confirmed in Epic.       Delivery Scheduled: Yes, Expected medication delivery date: 01/17/24.  However, Rx request for refills was sent to the provider as there are none remaining.     Medication will be delivered via Same Day Courier to the prescription address in Epic [place].    Kerby Less   [facility] Specialty and Home Delivery Pharmacy  Specialty Technician

## 2024-01-16 MED ORDER — MYFORTIC 180 MG TABLET,DELAYED RELEASE
ORAL_TABLET | Freq: Two times a day (BID) | ORAL | 11 refills | 30.00 days | Status: CP
Start: 2024-01-16 — End: 2025-01-15
  Filled 2024-01-17: qty 180, 30d supply, fill #0

## 2024-01-17 MED FILL — MG-PLUS-PROTEIN 133 MG TABLET: ORAL | 30 days supply | Qty: 60 | Fill #5

## 2024-01-17 MED FILL — ENVARSUS XR 0.75 MG TABLET,EXTENDED RELEASE: ORAL | 30 days supply | Qty: 60 | Fill #2

## 2024-01-17 MED FILL — METOPROLOL TARTRATE 25 MG TABLET: ORAL | 30 days supply | Qty: 30 | Fill #5

## 2024-02-06 ENCOUNTER — Ambulatory Visit: Admit: 2024-02-06 | Discharge: 2024-02-07 | Payer: MEDICARE

## 2024-02-06 LAB — CBC W/ AUTO DIFF
BASOPHILS ABSOLUTE COUNT: 0 10*9/L (ref 0.0–0.1)
BASOPHILS RELATIVE PERCENT: 0.9 %
EOSINOPHILS ABSOLUTE COUNT: 0.3 10*9/L (ref 0.0–0.5)
EOSINOPHILS RELATIVE PERCENT: 6.5 %
HEMATOCRIT: 45 % (ref 39.0–48.0)
HEMOGLOBIN: 14.9 g/dL (ref 12.9–16.5)
LYMPHOCYTES ABSOLUTE COUNT: 0.5 10*9/L — ABNORMAL LOW (ref 1.1–3.6)
LYMPHOCYTES RELATIVE PERCENT: 10.7 %
MEAN CORPUSCULAR HEMOGLOBIN CONC: 33.1 g/dL (ref 32.0–36.0)
MEAN CORPUSCULAR HEMOGLOBIN: 30.5 pg (ref 25.9–32.4)
MEAN CORPUSCULAR VOLUME: 92.2 fL (ref 77.6–95.7)
MEAN PLATELET VOLUME: 8.7 fL (ref 6.8–10.7)
MONOCYTES ABSOLUTE COUNT: 0.4 10*9/L (ref 0.3–0.8)
MONOCYTES RELATIVE PERCENT: 8.3 %
NEUTROPHILS ABSOLUTE COUNT: 3.2 10*9/L (ref 1.8–7.8)
NEUTROPHILS RELATIVE PERCENT: 73.6 %
NUCLEATED RED BLOOD CELLS: 0 /100{WBCs} (ref <=4)
PLATELET COUNT: 170 10*9/L (ref 150–450)
RED BLOOD CELL COUNT: 4.88 10*12/L (ref 4.26–5.60)
RED CELL DISTRIBUTION WIDTH: 14.4 % (ref 12.2–15.2)
WBC ADJUSTED: 4.4 10*9/L (ref 3.6–11.2)

## 2024-02-06 LAB — MAGNESIUM: MAGNESIUM: 2 mg/dL (ref 1.6–2.6)

## 2024-02-06 LAB — BASIC METABOLIC PANEL
ANION GAP: 10 mmol/L (ref 5–14)
BLOOD UREA NITROGEN: 16 mg/dL (ref 9–23)
BUN / CREAT RATIO: 21
CALCIUM: 9.6 mg/dL (ref 8.7–10.4)
CHLORIDE: 104 mmol/L (ref 98–107)
CO2: 26.8 mmol/L (ref 20.0–31.0)
CREATININE: 0.76 mg/dL (ref 0.73–1.18)
EGFR CKD-EPI (2021) MALE: >90 mL/min/{1.73_m2} (ref >=60)
GLUCOSE RANDOM: 131 mg/dL (ref 70–179)
POTASSIUM: 4.7 mmol/L (ref 3.4–4.8)
SODIUM: 141 mmol/L (ref 135–145)

## 2024-02-06 LAB — PHOSPHORUS: PHOSPHORUS: 2.4 mg/dL (ref 2.4–5.1)

## 2024-02-06 LAB — TACROLIMUS LEVEL, TROUGH: TACROLIMUS, TROUGH: 6.7 ng/mL (ref 5.0–15.0)

## 2024-02-06 NOTE — Unmapped (Signed)
 Abstraction Result Flowsheet Data    This patient's last AWV date: [facility] Last Medicare Wellness Visit Date: 05/06/2020  This patients last WCC/CPE date: : Not Found      Reason for Encounter  Reason for Encounter: Outreach  Primary Reason for Outreach: AWV  Text Message: No  MyChart Message: No  Outreach Call Outcome: No Voicemail Available

## 2024-02-07 ENCOUNTER — Ambulatory Visit: Admit: 2024-02-07 | Discharge: 2024-02-08 | Payer: MEDICARE | Attending: Family | Primary: Family

## 2024-02-07 DIAGNOSIS — Z87891 Personal history of nicotine dependence: Principal | ICD-10-CM

## 2024-02-07 DIAGNOSIS — H60391 Other infective otitis externa, right ear: Principal | ICD-10-CM

## 2024-02-07 DIAGNOSIS — R7303 Prediabetes: Principal | ICD-10-CM

## 2024-02-07 MED ORDER — NEOMYCIN-POLYMYXIN-HYDROCORT 3.5 MG-10,000 UNIT/ML-1 % EAR DROPS,SUSP
Freq: Four times a day (QID) | OTIC | 0 refills | 17.00 days | Status: CP
Start: 2024-02-07 — End: 2024-02-19
  Filled 2024-02-09: qty 10, 5d supply, fill #0

## 2024-02-07 NOTE — Unmapped (Signed)
 You can order lidocaine 4% patches online (Amazon) for about a dollar per patch.

## 2024-02-07 NOTE — Unmapped (Signed)
 Assessment and Plan:     Pre-diabetes   HGB A1c 6.1 (up from 6.0 four months ago). DM well controlled. Encouraged patient to continue carb controlled diet and regular exercise. Continue to monitor home blood sugar values and notify PCP/clinic if values rise above 130-140.    - POCT glycosylated hemoglobin (Hb A1C)    Renovascular hypertension  BP at goal (102/70 in clinic today). Continue metoprolol tartrate 12.5 mg BID. Reviewed low sodium diet and encouraged regular exercise. Advised to continue to monitor and log at-home BP readings. Contact clinic if readings become elevated.     Personal history of smoking  Discussed lung cancer screening. After shared decision making patient agrees to CT scan.   - CT Lung Cancer Screening Baseline or Annual Low Dose; Future    Other infective acute otitis externa of right ear  - neomycin-polymyxin-hydrocortisone (CORTISPORIN) 3.5-10,000-1 mg/mL-unit/mL-% otic suspension; Administer 3 drops to the right ear four (4) times a day for 5 days.         I personally spent 30 minutes face-to-face and non-face-to-face in the care of this patient, which includes all pre, intra, and post visit time on the date of service.    Return in about 4 months (around 06/08/2024) for Next scheduled follow up.    HPI:      Louis Dennis is here for   Chief Complaint   Patient presents with    Hypertension    Prediabetes    Leg Pain     LT lower leg pain for a long time.  States it is sciatic nerve pain.       Hypertension: Patient presents for follow-up of hypertension. Blood pressure goal < 140/90.  Hypertension has customarily been at goal complicated by renal disease.  Home blood pressure readings: did not bring log. Salt intake and diet: salt not added to cooking and salt shaker not on table. Associated signs and symptoms: none. Patient denies: blurred vision, chest pain, dyspnea, headache, neck aches, orthopnea, palpitations, paroxysmal nocturnal dyspnea, peripheral edema, pulsating in the ears, and tiredness/fatigue. Medication compliance: taking as prescribed. He is doing regular exercise.     Prediabetes: Patient presents for follow up of prediabetes.  A1C goal is <8.  Current symptoms include: none. Symptoms have been well-controlled. Patient denies foot ulcerations, hyperglycemia, hypoglycemia , increase appetite, nausea, paresthesia of the feet, polydipsia, polyuria, visual disturbances, vomitting and weight loss. Evaluation to date has included: hemoglobin A1C.  Home sugars: Between 90-120 in the morning.  BS 130 yesterday. Current treatment: not longer on metformin. Controlled by diet. Following with Nephrology every 4 months for management as well.      Patient chronic left lower leg pain related to sciatica. He has tried lidocaine patches in the past but his insurance will not cover them. He has been buying them out of pocket but each is approximately $7. Asking about alternative patches. Back pain and leg pain improves with regular walking for exercise. Working on weight loss, down 5 lbs.     Past tobacco use: Smoked for approximately 28 years. Started smoking age 62, quit around age 40. Stopped 14-15 years ago. No chronic cough, dyspnea, hemoptysis.     Patient endorses right ear pain. Denies fever, rhinorrhea, nasal congestion, ear drainage or diminished hearing.      ROS:      Comprehensive 10 point ROS negative unless otherwise stated in the HPI.      [facility] Components:     Medication adherence and barriers  to the treatment plan have been addressed. Opportunities to optimize healthy behaviors have been discussed. Patient / caregiver voiced understanding.    Past Medical/Surgical History:     Past Medical History:   Diagnosis Date    Abnormal thyroid function test     End stage renal disease (CMS-HCC)     now on peritoneal dialysis    Gout     reports was in Right hand, foot and left elbow    Hypertension     takes meds intermitttently based on readings    Nephrolithiasis 2014    Visual impairment     glasses     Past Surgical History:   Procedure Laterality Date    OTHER SURGICAL HISTORY Right 08/2017    Pt had catheter moved from left side to right side.    pd catheter  2012    PR COLONOSCOPY W/BIOPSY SINGLE/MULTIPLE N/A 06/15/2018    Procedure: COLONOSCOPY, FLEXIBLE, PROXIMAL TO SPLENIC FLEXURE; WITH BIOPSY, SINGLE OR MULTIPLE;  Surgeon: Charm Rings, MD;  Location: [facility];  Service: Gastroenterology    PR COLSC FLX W/RMVL OF TUMOR POLYP LESION SNARE TQ N/A 06/15/2018    Procedure: COLONOSCOPY FLEX; W/REMOV TUMOR/LES BY SNARE;  Surgeon: Charm Rings, MD;  [facility]: [facility];  Service: Gastroenterology    PR COLSC FLX W/RMVL OF TUMOR POLYP LESION SNARE TQ N/A 10/24/2023    Procedure: COLONOSCOPY FLEX; W/REMOV TUMOR/LES BY SNARE;  Surgeon: Luanne Bras, MD;  Location: [facility];  Service: Gastroenterology    PR EXPLORATORY OF ABDOMEN N/A 11/22/2018    Procedure: EXPLORATORY LAPAROTOMY, EXPLORATORY CELIOTOMY WITH OR WITHOUT BIOPSY(S);  Surgeon: Lawrence Marseilles Day Thurnell Lose, MD;  Location: MAIN OR [facility];  Service: Trauma    PR EXPLORE PARATHYROID GLANDS N/A 11/11/2019    Procedure: RS 22 PARATHYROIDECTOMY OR EXPLORATION OF PARATHYROID(S);  Surgeon: Johny Chess, MD;  Location: MAIN OR [facility];  Service: Surgical Oncology    PR FREEING BOWEL ADHESION,ENTEROLYSIS N/A 11/22/2018    Procedure: Enterolysis (Separt Proc);  Surgeon: Lawrence Marseilles Day Thurnell Lose, MD;  Location: MAIN OR [facility];  Service: Trauma    PR REMOVE PERITONEAL FOREIGN BODY N/A 11/22/2018    Procedure: Removal Of Peritoneal Of Foreign Body From Peritoneal Cavity;  Surgeon: Lawrence Marseilles Day Thurnell Lose, MD;  Location: MAIN OR [facility];  Service: Trauma    PR TRANSPLANT,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    Procedure: [facility] STD PREP CAD DONR RENAL ALLOGFT PRIOR TO TRNSPLNT, INCL DISSEC/REM PERINEPH FAT, DIAPH/RTPER ATTAC;  Surgeon: Doyce Loose, MD; Location: MAIN OR [place];  Service: Transplant    PR TRANSPLANTATION OF KIDNEY N/A 05/24/2019    Procedure: RENAL ALLOTRANSPLANTATION, IMPLANTATION OF GRAFT; WITHOUT RECIPIENT NEPHRECTOMY;  Surgeon: Doyce Loose, MD;  Location: MAIN OR [facility];  Service: Transplant       Family History:     Family History   Problem Relation Age of Onset    Hypertension Mother     Kidney disease Mother     Diabetes Mother     Hypertension Father     Kidney disease Sister         s/p transplant    Hypertension Brother     Glaucoma Neg Hx     Amblyopia Neg Hx     Blindness Neg Hx     Retinal detachment Neg Hx     Strabismus Neg Hx     Macular degeneration Neg Hx  Basal cell carcinoma Neg Hx     Melanoma Neg Hx     Squamous cell carcinoma Neg Hx        Social History:     Social History     Tobacco Use    Smoking status: Former     Current packs/day: 0.00     Average packs/day: 1 pack/day for 30.0 years (30.0 ttl pk-yrs)     Types: Cigarettes     Start date: 07/11/1979     Quit date: 07/10/2009     Years since quitting: 14.5     Passive exposure: Never    Smokeless tobacco: Never   Vaping Use    Vaping status: Never Used   Substance Use Topics    Alcohol use: No     Alcohol/week: 0.0 standard drinks of alcohol    Drug use: No       Allergies:     Patient has no known allergies.    Current Medications:     Current Outpatient Medications   Medication Sig Dispense Refill    chlorhexidine (PERIDEX) 0.12 % solution       lidocaine (LIDODERM) 5 % patch Place 1 patch on the skin every twelve (12) hours. Apply to affected area for 12 hours only each day (then remove patch) 10 patch 5    magnesium oxide-Mg AA chelate (MAGNESIUM, AMINO ACID CHELATE,) 133 mg Take 1 tablet by mouth two (2) times a day. 60 tablet 11    metoPROLOL tartrate (LOPRESSOR) 25 MG tablet Take 0.5 tablets (12.5 mg total) by mouth two (2) times a day. 30 tablet 11    MYFORTIC 180 mg EC tablet Take 3 tablets (540 mg total) by mouth Two (2) times a day. 180 tablet 11 tacrolimus (ENVARSUS XR) 0.75 mg Tb24 extended release tablet Take 2 tablets (1.5 mg total) by mouth daily. 60 tablet 11    DENTA 5000 PLUS 1.1 % Crea USE DAY AND NIGHT, BRUSH THOROUGHLY FOR 2 MINS AND SPIT OUT. DO NOT SWALLOW AND RINSE LIGHTLY (Patient not taking: Reported on 02/07/2024)      polyethylene glycol (GOLYTELY) 236-22.74-6.74 gram solution Take by mouth as directed per [facility] GI prep instructions, for split bowel prep. (Patient not taking: Reported on 10/10/2023) 4000 mL 0     No current facility-administered medications for this visit.       Health Maintenance:     Health Maintenance   Topic Date Due    Meningococcal B Vaccines (1 of 5 - Increased Risk) Never done    Lung Cancer Screening Shared Decision Making  12/01/2023    Lipid Screening  05/17/2028    DTaP/Tdap/Td Vaccines (2 - Td or Tdap) 12/30/2029    Colon Cancer Screening  10/23/2033    Pneumococcal Vaccine 50+  Completed    Hepatitis C Screen  Completed    COVID-19 Vaccine  Completed    Influenza Vaccine  Completed    Zoster Vaccines  Discontinued       Immunizations:     Immunization History   Administered Date(s) Administered    COVID-19 VACCINE,MRNA(MODERNA)(PF) 04/12/2020, 05/10/2020, 08/15/2020, 03/17/2021    Covid-19 Vac, (59yr+) (Comirnaty) Mrna Pfizer  11/17/2023    Covid-19 Vac, (62yr+) (Spikevax) Monovalent Moderna 11/11/2022    INFLUENZA INJ MDCK PF, QUAD,(FLUCELVAX)(22MO AND UP EGG FREE) 09/04/2017, 08/31/2020, 08/30/2021    INFLUENZA TIV (TRI) PF (IM)(HISTORICAL) 08/17/2011, 08/08/2012, 08/05/2013, 08/28/2014, 08/11/2015, 08/29/2015    INFLUENZA VACCINE IIV3(IM)(PF)6 MOS UP 10/10/2023    Influenza Vaccine Quad(IM)6  MO-Adult(PF) 08/16/2015, 09/06/2016, 09/11/2017, 08/06/2019, 11/07/2022    Influenza Virus Vaccine, unspecified formulation 08/30/2016, 09/18/2017, 09/04/2018, 10/02/2018    PNEUMOCOCCAL POLYSACCHARIDE 23-VALENT 07/06/2015    PPD Test 09/08/2011, 09/07/2012, 09/05/2013, 09/09/2014, 09/07/2015, 08/10/2016, 09/11/2017, 09/05/2018, 01/03/2019    Pneumococcal Conjugate 13-Valent 07/24/2010, 07/15/2015    Pneumococcal Conjugate 20-valent 08/30/2021    TdaP 12/31/2019     I have reviewed and (if needed) updated the patient's problem list, medications, allergies, past medical and surgical history, social and family history.     Vital Signs:     Wt Readings from Last 3 Encounters:   02/07/24 62.1 kg (137 lb)   11/17/23 64.4 kg (142 lb)   10/24/23 60.8 kg (134 lb)     Temp Readings from Last 3 Encounters:   02/07/24 37 ??C (98.6 ??F) (Oral)   11/17/23 36.7 ??C (98.1 ??F) (Temporal)   10/24/23 36.5 ??C (97.7 ??F) (Temporal)     BP Readings from Last 3 Encounters:   02/07/24 102/70   11/17/23 115/75   10/24/23 94/71     Pulse Readings from Last 3 Encounters:   02/07/24 78   11/17/23 82   10/24/23 67     Estimated body mass index is 24.27 kg/m?? as calculated from the following:    Height as of this encounter: 160 cm (5' 3).    Weight as of this encounter: 62.1 kg (137 lb).  Facility age limit for growth %iles is 20 years.      Objective:      General: Alert and oriented x3. Well-appearing. No acute distress.   HEENT:  Normocephalic.  Atraumatic. Conjunctiva and sclera normal. Right ear canal erythematous. No visible exudate. OP MMM without lesions.   Neck:  Supple. No thyroid enlargement. No adenopathy.   Heart:  Regular rate and rhythm. Normal S1, S2. No murmurs, rubs or gallops.   Lungs:  No respiratory distress.  Lungs clear to auscultation. No wheezes, rhonchi, or rales.   Extremities:  No edema. Peripheral pulses normal.   Skin:  Warm, dry. No rash or lesions present.   Neuro:  Non-focal. No obvious weakness.   Psych:  Affect normal, eye contact good, speech clear and coherent.         Noralyn Pick, FNP

## 2024-02-08 NOTE — Unmapped (Signed)
 [facility] Specialty and Home Delivery Pharmacy Refill Coordination Note    Specialty Medication(s) to be Shipped:   Transplant: Envarsus XR 0.75 mg and Myfortic 180 mg    Other medication(s) to be shipped:  Magnesium, Metoprolol, Cortisporin     Louis Dennis, DOB: Jul 20, 1961  Phone: [phone] (home)       All above HIPAA information was verified with patient.     Was a Nurse, learning disability used for this call? No    Completed refill call assessment today to schedule patient's medication shipment from the [facility] and Home Delivery Pharmacy  [phone]).  All relevant notes have been reviewed.     Specialty medication(s) and dose(s) confirmed: Regimen is correct and unchanged.   Changes to medications: Jaydis reports no changes at this time.  Changes to insurance: No  New side effects reported not previously addressed with a pharmacist or physician: None reported  Questions for the pharmacist: No    Confirmed patient received a Conservation officer, historic buildings and a Surveyor, mining with first shipment. The patient will receive a drug information handout for each medication shipped and additional FDA Medication Guides as required.       DISEASE/MEDICATION-SPECIFIC INFORMATION        N/A    SPECIALTY MEDICATION ADHERENCE     Medication Adherence    Patient reported X missed doses in the last month: 0  Specialty Medication: ENVARSUS XR 0.75 mg Tb24 extended release tablet  Patient is on additional specialty medications: Yes  Additional Specialty Medications: MYFORTIC 180 mg EC tablet  Patient Reported Additional Medication X Missed Doses in the Last Month: 0  Patient is on more than two specialty medications: No              Were doses missed due to medication being on hold? No    Envarsus XR 0.75 mg: 2 days of medicine on hand   Myfortic 180 mg: 2 days of medicine on hand        REFERRAL TO PHARMACIST     Referral to the pharmacist: Not needed      [facility]     Shipping address confirmed in Epic.       Delivery Scheduled: Yes, Expected medication delivery date: 02/09/24.     Medication will be delivered via Same Day Courier to the prescription address in Epic [place].    Willette Pa   [facility] Specialty and Home Delivery Pharmacy  Specialty Technician

## 2024-02-09 MED FILL — METOPROLOL TARTRATE 25 MG TABLET: ORAL | 30 days supply | Qty: 30 | Fill #6

## 2024-02-09 MED FILL — MG-PLUS-PROTEIN 133 MG TABLET: ORAL | 30 days supply | Qty: 60 | Fill #6

## 2024-02-09 MED FILL — MYFORTIC 180 MG TABLET,DELAYED RELEASE: ORAL | 30 days supply | Qty: 180 | Fill #1

## 2024-02-09 MED FILL — ENVARSUS XR 0.75 MG TABLET,EXTENDED RELEASE: ORAL | 30 days supply | Qty: 60 | Fill #3

## 2024-03-08 NOTE — Unmapped (Signed)
 [facility] Specialty and Home Delivery Pharmacy Refill Coordination Note    Specialty Medication(s) to be Shipped:   Transplant: Envarsus 0.75mg  and mycophenolate mofetil 180mg     Other medication(s) to be shipped:  magnesium (amino acid chelate) 133 mg tablet (magnesium oxide-Mg AA chelate), metoPROLOL tartrate 25 MG tablet (Lopressor)     Louis Dennis, DOB: 1961-10-06  Phone: [phone] (home)       All above HIPAA information was verified with patient.     Was a Nurse, learning disability used for this call? No    Completed refill call assessment today to schedule patient's medication shipment from the [facility] and Home Delivery Pharmacy  [phone]).  All relevant notes have been reviewed.     Specialty medication(s) and dose(s) confirmed: Regimen is correct and unchanged.   Changes to medications: Louis Dennis reports no changes at this time.  Changes to insurance: No  New side effects reported not previously addressed with a pharmacist or physician: None reported  Questions for the pharmacist: No    Confirmed patient received a Conservation officer, historic buildings and a Surveyor, mining with first shipment. The patient will receive a drug information handout for each medication shipped and additional FDA Medication Guides as required.       DISEASE/MEDICATION-SPECIFIC INFORMATION        N/A    SPECIALTY MEDICATION ADHERENCE     Medication Adherence    Patient reported X missed doses in the last month: 0  Patient is on additional specialty medications: Yes  Patient Reported Additional Medication X Missed Doses in the Last Month: 0  Patient is on more than two specialty medications: No              Were doses missed due to medication being on hold? No    ENVARSUS XR 0.75 mg Tb24 extended release tablet (tacrolimus): 4 days of medicine on hand   MYFORTIC 180 mg EC tablet (mycophenolate): 4 days of medicine on hand       REFERRAL TO PHARMACIST     Referral to the pharmacist: Not needed      [facility]     Shipping address confirmed in Epic. Cost and Payment: Patient has a copay of $7.13. They are aware and have authorized the pharmacy to charge the credit card on file.    Delivery Scheduled: Yes, Expected medication delivery date: 03/11/24.     Medication will be delivered via Same Day Courier to the prescription address in Epic WAM.    Louis Dennis   [facility] Specialty and Home Delivery Pharmacy  Specialty Technician

## 2024-03-11 MED FILL — ENVARSUS XR 0.75 MG TABLET,EXTENDED RELEASE: ORAL | 30 days supply | Qty: 60 | Fill #4

## 2024-03-11 MED FILL — MG-PLUS-PROTEIN 133 MG TABLET: ORAL | 30 days supply | Qty: 60 | Fill #7

## 2024-03-11 MED FILL — MYFORTIC 180 MG TABLET,DELAYED RELEASE: ORAL | 30 days supply | Qty: 180 | Fill #2

## 2024-03-11 MED FILL — METOPROLOL TARTRATE 25 MG TABLET: ORAL | 30 days supply | Qty: 30 | Fill #7

## 2024-03-14 ENCOUNTER — Ambulatory Visit: Admit: 2024-03-14 | Discharge: 2024-03-15 | Payer: MEDICARE

## 2024-03-14 LAB — BASIC METABOLIC PANEL
ANION GAP: 8 mmol/L (ref 5–14)
BLOOD UREA NITROGEN: 20 mg/dL (ref 9–23)
BUN / CREAT RATIO: 29
CALCIUM: 9.7 mg/dL (ref 8.7–10.4)
CHLORIDE: 105 mmol/L (ref 98–107)
CO2: 27.6 mmol/L (ref 20.0–31.0)
CREATININE: 0.68 mg/dL — ABNORMAL LOW (ref 0.73–1.18)
EGFR CKD-EPI (2021) MALE: >90 mL/min/1.73m2 (ref >=60)
GLUCOSE RANDOM: 127 mg/dL (ref 70–179)
POTASSIUM: 4.4 mmol/L (ref 3.4–4.8)
SODIUM: 141 mmol/L (ref 135–145)

## 2024-03-14 LAB — CBC W/ AUTO DIFF
BASOPHILS ABSOLUTE COUNT: 0 10*9/L (ref 0.0–0.1)
BASOPHILS RELATIVE PERCENT: 1 %
EOSINOPHILS ABSOLUTE COUNT: 0.3 10*9/L (ref 0.0–0.5)
EOSINOPHILS RELATIVE PERCENT: 7.4 %
HEMATOCRIT: 44.3 % (ref 39.0–48.0)
HEMOGLOBIN: 14.8 g/dL (ref 12.9–16.5)
LYMPHOCYTES ABSOLUTE COUNT: 0.5 10*9/L — ABNORMAL LOW (ref 1.1–3.6)
LYMPHOCYTES RELATIVE PERCENT: 14.5 %
MEAN CORPUSCULAR HEMOGLOBIN CONC: 33.4 g/dL (ref 32.0–36.0)
MEAN CORPUSCULAR HEMOGLOBIN: 30.2 pg (ref 25.9–32.4)
MEAN CORPUSCULAR VOLUME: 90.3 fL (ref 77.6–95.7)
MEAN PLATELET VOLUME: 8.9 fL (ref 6.8–10.7)
MONOCYTES ABSOLUTE COUNT: 0.3 10*9/L (ref 0.3–0.8)
MONOCYTES RELATIVE PERCENT: 9.1 %
NEUTROPHILS ABSOLUTE COUNT: 2.5 10*9/L (ref 1.8–7.8)
NEUTROPHILS RELATIVE PERCENT: 68 %
NUCLEATED RED BLOOD CELLS: 0 /100{WBCs} (ref <=4)
PLATELET COUNT: 167 10*9/L (ref 150–450)
RED BLOOD CELL COUNT: 4.91 10*12/L (ref 4.26–5.60)
RED CELL DISTRIBUTION WIDTH: 15.1 % (ref 12.2–15.2)
WBC ADJUSTED: 3.7 10*9/L (ref 3.6–11.2)

## 2024-03-14 LAB — URINALYSIS WITH MICROSCOPY WITH CULTURE REFLEX PERFORMABLE
BACTERIA: NONE SEEN /HPF
BILIRUBIN UA: NEGATIVE
BLOOD UA: NEGATIVE
GLUCOSE UA: NEGATIVE
KETONES UA: NEGATIVE
LEUKOCYTE ESTERASE UA: NEGATIVE
NITRITE UA: NEGATIVE
PH UA: 5.5 (ref 5.0–9.0)
PROTEIN UA: NEGATIVE
RBC UA: <1 /HPF (ref <=3)
SPECIFIC GRAVITY UA: 1.018 (ref 1.003–1.030)
SQUAMOUS EPITHELIAL: <1 /HPF (ref 0–5)
UROBILINOGEN UA: <2
WBC UA: 1 /HPF (ref <=2)

## 2024-03-14 LAB — PHOSPHORUS: PHOSPHORUS: 2.9 mg/dL (ref 2.4–5.1)

## 2024-03-14 LAB — TACROLIMUS LEVEL, TROUGH: TACROLIMUS, TROUGH: 6.4 ng/mL (ref 5.0–15.0)

## 2024-03-14 LAB — MAGNESIUM: MAGNESIUM: 1.9 mg/dL (ref 1.6–2.6)

## 2024-04-05 NOTE — Unmapped (Signed)
 [facility] Refill Coordination Note    Specialty Medication(s) to be Shipped:   Transplant: Envarsus xr0.75 mg and Myfortic 180 mg    Other medication(s) to be shipped:  magnesium 133 mg tabs, metoprolol 25 mg      Louis Dennis, DOB: 01/01/1961  Phone: [phone] (home)       All above HIPAA information was verified with patient.     Was a Nurse, learning disability used for this call? No    Completed refill call assessment today to schedule patient's medication shipment from the [facility]  [phone]).  All relevant notes have been reviewed.     Specialty medication(s) and dose(s) confirmed: Regimen is correct and unchanged.   Changes to medications: Jahmarion reports no changes at this time.  Changes to insurance: No  New side effects reported not previously addressed with a pharmacist or physician: None reported  Questions for the pharmacist: No    Confirmed patient received a Conservation officer, historic buildings and a Surveyor, mining with first shipment. The patient will receive a drug information handout for each medication shipped and additional FDA Medication Guides as required.       DISEASE/MEDICATION-SPECIFIC INFORMATION        N/A    SPECIALTY MEDICATION ADHERENCE     Medication Adherence    Patient reported X missed doses in the last month: 0  Specialty Medication: ENVARSUS XR 0.75 mg Tb24 extended release tablet (tacrolimus)  Patient is on additional specialty medications: Yes  Additional Specialty Medications: MYFORTIC 180 mg EC tablet (mycophenolate)  Patient Reported Additional Medication X Missed Doses in the Last Month: 0  Patient is on more than two specialty medications: No              Were doses missed due to medication being on hold? No    MYFORTIC 180 mg EC tablet (mycophenolate)  7 days of medicine on hand   ENVARSUS XR 0.75 mg Tb24 extended release tablet (tacrolimus)  7 days of medicine on hand       REFERRAL TO PHARMACIST     Referral to the pharmacist: Not needed      [facility]     Shipping address confirmed in Epic.     Cost and Payment: Patient has a copay of $7.13. They are aware and have authorized the pharmacy to charge the credit card on file.    Delivery Scheduled: Yes, Expected medication delivery date: 04/09/2024.     Medication will be delivered via Same Day Courier to the prescription address in Epic WAM.    [facility]  Specialty Technician

## 2024-04-09 MED FILL — MYFORTIC 180 MG TABLET,DELAYED RELEASE: ORAL | 30 days supply | Qty: 180 | Fill #3

## 2024-04-09 MED FILL — METOPROLOL TARTRATE 25 MG TABLET: ORAL | 30 days supply | Qty: 30 | Fill #8

## 2024-04-09 MED FILL — ENVARSUS XR 0.75 MG TABLET,EXTENDED RELEASE: ORAL | 30 days supply | Qty: 60 | Fill #5

## 2024-04-09 MED FILL — MG-PLUS-PROTEIN 133 MG TABLET: ORAL | 30 days supply | Qty: 60 | Fill #8

## 2024-04-12 ENCOUNTER — Inpatient Hospital Stay: Admit: 2024-04-12 | Discharge: 2024-04-13 | Payer: MEDICARE

## 2024-04-15 ENCOUNTER — Ambulatory Visit: Admit: 2024-04-15 | Discharge: 2024-04-16 | Payer: MEDICARE

## 2024-04-15 LAB — URINALYSIS WITH MICROSCOPY
BACTERIA: NONE SEEN /HPF
BILIRUBIN UA: NEGATIVE
BLOOD UA: NEGATIVE
GLUCOSE UA: NEGATIVE
KETONES UA: NEGATIVE
LEUKOCYTE ESTERASE UA: NEGATIVE
NITRITE UA: NEGATIVE
PH UA: 6 (ref 5.0–9.0)
PROTEIN UA: NEGATIVE
RBC UA: <1 /HPF (ref <=3)
SPECIFIC GRAVITY UA: 1.023 (ref 1.003–1.030)
SQUAMOUS EPITHELIAL: <1 /HPF (ref 0–5)
UROBILINOGEN UA: <2
WBC UA: 1 /HPF (ref <=2)

## 2024-04-15 LAB — PROTEIN / CREATININE RATIO, URINE
CREATININE, URINE: 98.4 mg/dL
PROTEIN URINE: 6.2 mg/dL
PROTEIN/CREAT RATIO, URINE: 0.063

## 2024-04-15 LAB — CBC W/ AUTO DIFF
BASOPHILS ABSOLUTE COUNT: 0 10*9/L (ref 0.0–0.1)
BASOPHILS RELATIVE PERCENT: 1.1 %
EOSINOPHILS ABSOLUTE COUNT: 0.3 10*9/L (ref 0.0–0.5)
EOSINOPHILS RELATIVE PERCENT: 6.9 %
HEMATOCRIT: 44.3 % (ref 39.0–48.0)
HEMOGLOBIN: 14.8 g/dL (ref 12.9–16.5)
LYMPHOCYTES ABSOLUTE COUNT: 0.5 10*9/L — ABNORMAL LOW (ref 1.1–3.6)
LYMPHOCYTES RELATIVE PERCENT: 12.6 %
MEAN CORPUSCULAR HEMOGLOBIN CONC: 33.4 g/dL (ref 32.0–36.0)
MEAN CORPUSCULAR HEMOGLOBIN: 30.3 pg (ref 25.9–32.4)
MEAN CORPUSCULAR VOLUME: 90.5 fL (ref 77.6–95.7)
MEAN PLATELET VOLUME: 8.9 fL (ref 6.8–10.7)
MONOCYTES ABSOLUTE COUNT: 0.4 10*9/L (ref 0.3–0.8)
MONOCYTES RELATIVE PERCENT: 9.6 %
NEUTROPHILS ABSOLUTE COUNT: 2.6 10*9/L (ref 1.8–7.8)
NEUTROPHILS RELATIVE PERCENT: 69.8 %
NUCLEATED RED BLOOD CELLS: 0 /100{WBCs} (ref <=4)
PLATELET COUNT: 151 10*9/L (ref 150–450)
RED BLOOD CELL COUNT: 4.89 10*12/L (ref 4.26–5.60)
RED CELL DISTRIBUTION WIDTH: 15.3 % — ABNORMAL HIGH (ref 12.2–15.2)
WBC ADJUSTED: 3.7 10*9/L (ref 3.6–11.2)

## 2024-04-15 LAB — COMPREHENSIVE METABOLIC PANEL
ALBUMIN: 4.6 g/dL (ref 3.4–5.0)
ALKALINE PHOSPHATASE: 76 U/L (ref 46–116)
ALT (SGPT): 16 U/L (ref 10–49)
ANION GAP: 13 mmol/L (ref 5–14)
AST (SGOT): 19 U/L (ref <=34)
BILIRUBIN TOTAL: 0.8 mg/dL (ref 0.3–1.2)
BLOOD UREA NITROGEN: 21 mg/dL (ref 9–23)
BUN / CREAT RATIO: 30
CALCIUM: 10.1 mg/dL (ref 8.7–10.4)
CHLORIDE: 105 mmol/L (ref 98–107)
CO2: 26.6 mmol/L (ref 20.0–31.0)
CREATININE: 0.7 mg/dL — ABNORMAL LOW (ref 0.73–1.18)
EGFR CKD-EPI (2021) MALE: >90 mL/min/1.73m2 (ref >=60)
GLUCOSE RANDOM: 135 mg/dL — ABNORMAL HIGH (ref 70–99)
POTASSIUM: 4.3 mmol/L (ref 3.4–4.8)
PROTEIN TOTAL: 7.4 g/dL (ref 5.7–8.2)
SODIUM: 145 mmol/L (ref 135–145)

## 2024-04-15 LAB — LIPID PANEL
CHOLESTEROL: 165 mg/dL (ref <200)
HDL CHOLESTEROL: 47 mg/dL (ref >40)
LDL CHOLESTEROL CALCULATED: 99 mg/dL (ref <100)
NON-HDL CHOLESTEROL: 118 mg/dL (ref <130)
TRIGLYCERIDES: 118 mg/dL (ref <150)

## 2024-04-15 LAB — BK VIRUS QUANTITATIVE PCR, BLOOD: BK BLOOD RESULT: NOT DETECTED

## 2024-04-15 LAB — IRON & TIBC
IRON SATURATION (CALC): 28 %
IRON: 74 ug/dL (ref 65–175)
TOTAL IRON BINDING CAPACITY (CALC): 263.3 ug/dL (ref 250.0–425.0)
TRANSFERRIN: 209 mg/dL — ABNORMAL LOW (ref 215.0–365.0)

## 2024-04-15 LAB — FERRITIN: FERRITIN: 30.5 ng/mL (ref 10.5–307.3)

## 2024-04-15 LAB — CMV DNA, QUANTITATIVE, PCR: CMV VIRAL LD: NOT DETECTED

## 2024-04-15 LAB — TACROLIMUS LEVEL, TROUGH: TACROLIMUS, TROUGH: 6.6 ng/mL (ref 5.0–15.0)

## 2024-04-15 LAB — PHOSPHORUS: PHOSPHORUS: 3.1 mg/dL (ref 2.4–5.1)

## 2024-04-15 LAB — PARATHYROID HORMONE (PTH): PARATHYROID HORMONE INTACT: 59.1 pg/mL (ref 18.4–80.1)

## 2024-04-15 LAB — MAGNESIUM: MAGNESIUM: 2.1 mg/dL (ref 1.6–2.6)

## 2024-04-17 LAB — VITAMIN D 25 HYDROXY: VITAMIN D, TOTAL (25OH): 30.8 ng/mL (ref 20.0–80.0)

## 2024-04-19 LAB — VITAMIN D 1,25 DIHYDROXY: VITAMIN D 1,25-DIHYDROXY: 43 pg/mL

## 2024-04-23 LAB — HLA DS POST TRANSPLANT
ANTI-DONOR DRW #1 MFI: 20 MFI
ANTI-DONOR DRW #2 MFI: 0 MFI
ANTI-DONOR HLA-A #2 MFI: 0 MFI
ANTI-DONOR HLA-B #1 MFI: 0 MFI
ANTI-DONOR HLA-B #2 MFI: 0 MFI
ANTI-DONOR HLA-C #1 MFI: 0 MFI
ANTI-DONOR HLA-C #2 MFI: 0 MFI
ANTI-DONOR HLA-DQB #1 MFI: 0 MFI
ANTI-DONOR HLA-DQB #2 MFI: 6 MFI
ANTI-DONOR HLA-DR #1 MFI: 0 MFI
ANTI-DONOR HLA-DR #2 MFI: 0 MFI

## 2024-04-23 LAB — FSAB CLASS 2 ANTIBODY SPECIFICITY: HLA CL2 AB RESULT: POSITIVE

## 2024-04-23 LAB — FSAB CLASS 1 ANTIBODY SPECIFICITY: HLA CLASS 1 ANTIBODY RESULT: POSITIVE

## 2024-05-03 ENCOUNTER — Emergency Department
Admit: 2024-05-03 | Discharge: 2024-05-03 | Disposition: A | Discharge status: Home / Self Care | Payer: MEDICARE | Attending: Emergency Medicine

## 2024-05-03 DIAGNOSIS — Z940 Kidney transplant status: Principal | ICD-10-CM

## 2024-05-03 DIAGNOSIS — R079 Chest pain, unspecified: Principal | ICD-10-CM

## 2024-05-03 LAB — CBC W/ AUTO DIFF
BASOPHILS ABSOLUTE COUNT: 0 10*9/L (ref 0.0–0.1)
BASOPHILS RELATIVE PERCENT: 0.9 %
EOSINOPHILS ABSOLUTE COUNT: 0.3 10*9/L (ref 0.0–0.5)
EOSINOPHILS RELATIVE PERCENT: 8.1 %
HEMATOCRIT: 44.4 % (ref 39.0–48.0)
HEMOGLOBIN: 15.1 g/dL (ref 12.9–16.5)
LYMPHOCYTES ABSOLUTE COUNT: 0.6 10*9/L — ABNORMAL LOW (ref 1.1–3.6)
LYMPHOCYTES RELATIVE PERCENT: 16 %
MEAN CORPUSCULAR HEMOGLOBIN CONC: 33.9 g/dL (ref 32.0–36.0)
MEAN CORPUSCULAR HEMOGLOBIN: 30.9 pg (ref 25.9–32.4)
MEAN CORPUSCULAR VOLUME: 91 fL (ref 77.6–95.7)
MEAN PLATELET VOLUME: 9 fL (ref 6.8–10.7)
MONOCYTES ABSOLUTE COUNT: 0.4 10*9/L (ref 0.3–0.8)
MONOCYTES RELATIVE PERCENT: 9.8 %
NEUTROPHILS ABSOLUTE COUNT: 2.6 10*9/L (ref 1.8–7.8)
NEUTROPHILS RELATIVE PERCENT: 65.2 %
NUCLEATED RED BLOOD CELLS: 0 /100{WBCs} (ref <=4)
PLATELET COUNT: 162 10*9/L (ref 150–450)
RED BLOOD CELL COUNT: 4.88 10*12/L (ref 4.26–5.60)
RED CELL DISTRIBUTION WIDTH: 15.1 % (ref 12.2–15.2)
WBC ADJUSTED: 3.9 10*9/L (ref 3.6–11.2)

## 2024-05-03 LAB — COMPREHENSIVE METABOLIC PANEL
ALBUMIN: 4.8 g/dL (ref 3.4–5.0)
ALKALINE PHOSPHATASE: 66 U/L (ref 46–116)
ALT (SGPT): 13 U/L (ref 10–49)
ANION GAP: 15 mmol/L — ABNORMAL HIGH (ref 5–14)
AST (SGOT): 16 U/L (ref <=34)
BILIRUBIN TOTAL: 1.2 mg/dL (ref 0.3–1.2)
BLOOD UREA NITROGEN: 23 mg/dL (ref 9–23)
BUN / CREAT RATIO: 32
CALCIUM: 9.8 mg/dL (ref 8.7–10.4)
CHLORIDE: 105 mmol/L (ref 98–107)
CO2: 23.5 mmol/L (ref 20.0–31.0)
CREATININE: 0.71 mg/dL — ABNORMAL LOW (ref 0.73–1.18)
EGFR CKD-EPI (2021) MALE: >90 mL/min/1.73m2 (ref >=60)
GLUCOSE RANDOM: 112 mg/dL (ref 70–179)
POTASSIUM: 4.1 mmol/L (ref 3.4–4.8)
PROTEIN TOTAL: 7.4 g/dL (ref 5.7–8.2)
SODIUM: 143 mmol/L (ref 135–145)

## 2024-05-03 LAB — URINALYSIS WITH MICROSCOPY
BACTERIA: NONE SEEN /HPF
BILIRUBIN UA: NEGATIVE
BLOOD UA: NEGATIVE
GLUCOSE UA: NEGATIVE
KETONES UA: 10 — AB
LEUKOCYTE ESTERASE UA: NEGATIVE
NITRITE UA: NEGATIVE
PH UA: 5 (ref 5.0–9.0)
PROTEIN UA: NEGATIVE
RBC UA: <1 /HPF (ref <=3)
SPECIFIC GRAVITY UA: 1.017 (ref 1.003–1.030)
SQUAMOUS EPITHELIAL: <1 /HPF (ref 0–5)
UROBILINOGEN UA: <2
WBC UA: <1 /HPF (ref <=2)

## 2024-05-03 LAB — HIGH SENSITIVITY TROPONIN I - 2 HOUR SERIAL
HIGH SENSITIVITY TROPONIN - DELTA (0-2H): 0 ng/L (ref <=7)
HIGH-SENSITIVITY TROPONIN I - 2 HOUR: 3 ng/L (ref <=53)

## 2024-05-03 LAB — MAGNESIUM: MAGNESIUM: 1.9 mg/dL (ref 1.6–2.6)

## 2024-05-03 LAB — HIGH SENSITIVITY TROPONIN I - SERIAL: HIGH SENSITIVITY TROPONIN I: 3 ng/L (ref <=53)

## 2024-05-03 LAB — LACTATE SEPSIS, VENOUS: LACTATE BLOOD VENOUS: 0.7 mmol/L (ref 0.5–1.8)

## 2024-05-03 NOTE — Unmapped (Signed)
 BIBOCEMS from home, pt presents after waking up with an episode of chest pressure and SOB. Pt's symptoms self resolved, but also endorsed chills after the episode. Pt AAOx4, GCS=15

## 2024-05-03 NOTE — Unmapped (Signed)
 [facility]  Emergency Department Provider Note      ED Clinical Impression      Final diagnoses:   Chest pain, unspecified type (Primary)        Impression, Medical Decision Making, Progress Notes and Critical Care      Impression, Differential Diagnosis and Plan of Care    Louis Dennis is a 63 y.o. male with a past medical history of ESRD (s/p kidney transplant in 2020, on Tacrolimus/Myfortic), HTN, and anemia who was BIB EMS to the ED for evaluation of an episode of stabbing centralized chest pain at 3 am today with associated chills which lasted for 1 hour before spontaneously resolving.     On exam, patient is nontoxic appearing and in no acute distress. Vital signs are WNL, hemodynamically stable, and afebrile. Physical exam is benign and overall reassuring. Abdomen is soft, non-tender, and non-distended. No bilateral lower extremity edema.     Differential includes ACS versus dysrhythmia versus pneumonia versus viral illness including COVID.  No ongoing pain, hypoxia or tachycardia to suggest PE.  No radiation to the back or unequal pulses to suggest aortic dissection.  No focal lung findings or history of trauma to suggest pneumothorax.  Considered esophageal etiology, musculoskeletal or GERD.    Plan for EKG, basic labs, serial Troponins, magnesium, 4plex, UA, lactate, blood cultures, and CXR.           Discussion of Management with other Physicians, QHP or Appropriate Source: None  Independent Interpretation of Studies: I have independently reviewed EKG and note normal sinus rhythm at 63, normal axis, normal interval, no STEMI.  External Records Reviewed: Patient's most recent outpatient clinic note  Escalation of Care, Consideration of Admission/Observation/Transfer: Patient deemed appropriate for outpatient management  Social determinants that significantly affected care: None applicable  History obtained from other sources: Family    Additional Progress Notes    10:00 AM  CBC without leukocytosis or anemia. CMP without significant electrolyte abnormalities. hsTroponin WNL x 2.  Patient overall appears well, denies recurrence of symptoms while in the ED.  Cardiac workup is reassuring.  Viral swabs are negative.  Creatinine appears to be at baseline.  Given chills, blood cultures were sent with patient only called if these are positive, however my suspicion for bacteremia is low given no fevers or leukocytosis.  Will discharge patient with strict return precautions which he is amenable to.     Portions of this record have been created using Scientist, clinical (histocompatibility and immunogenetics). Dictation errors have been sought, but may not have been identified and corrected.    See chart and resident provider documentation for details.    ____________________________________________         History      Reason for Visit  Malaise    HPI   Louis Dennis is a 63 y.o. male with a past medical history of ESRD (s/p kidney transplant in 2020, on Tacrolimus/Myfortic), HTN, and anemia who was BIB EMS to the ED for evaluation of chest pain. The patient states that at 3 am today, he woke up with a stabbing pain in his centralized chest with associated chills which lasted for 1 hour. He reports the pain was worsened with deep inspiration. He checked his BP via an at-home monitor while having the pain and states it was elevated to 170/140. Currently, he states his chest pain has spontaneously resolved but endorses intermittent chills since the episode. Of note, he reports he has had increased caffeine intake over  the past 2 weeks. Denies cough, congestion, rhinorrhea, or dysuria.     Past Medical History:   Diagnosis Date    Abnormal thyroid function test     End stage renal disease      now on peritoneal dialysis    Gout     reports was in Right hand, foot and left elbow    Hypertension     takes meds intermitttently based on readings    Nephrolithiasis 2014    Visual impairment     glasses     Patient Active Problem List Diagnosis    Anemia in chronic kidney disease    ESRD (end stage renal disease)      Gout    Hypertensive renal disease with renal failure    End stage renal disease      Hypertension    Abnormal thyroid function test    Multinodular goiter    Family history of diabetes mellitus (DM)    Seborrheic dermatitis of scalp    Follicular cyst of skin    Encephalopathy acute    SBO (small bowel obstruction)      Swelling of arm    Immunocompromised state (HHS-HCC)    Kidney replaced by transplant (HHS-HCC)    Renovascular hypertension    Pre-diabetes    Kidney transplant status (HHS-HCC)     Past Surgical History:   Procedure Laterality Date    OTHER SURGICAL HISTORY Right 08/2017    Pt had catheter moved from left side to right side.    pd catheter  2012    PR COLONOSCOPY W/BIOPSY SINGLE/MULTIPLE N/A 06/15/2018    Procedure: COLONOSCOPY, FLEXIBLE, PROXIMAL TO SPLENIC FLEXURE; WITH BIOPSY, SINGLE OR MULTIPLE;  Surgeon: Gil La, MD;  [facility]: [facility];  Service: Gastroenterology    PR COLSC FLX W/RMVL OF TUMOR POLYP LESION SNARE TQ N/A 06/15/2018    Procedure: COLONOSCOPY FLEX; W/REMOV TUMOR/LES BY SNARE;  Surgeon: Gil La, MD;  Location: [facility];  Service: Gastroenterology    PR COLSC FLX W/RMVL OF TUMOR POLYP LESION SNARE TQ N/A 10/24/2023    Procedure: COLONOSCOPY FLEX; W/REMOV TUMOR/LES BY SNARE;  Surgeon: Dionicia Frater, MD;  Location: [facility];  Service: Gastroenterology    PR EXPLORATORY OF ABDOMEN N/A 11/22/2018    Procedure: EXPLORATORY LAPAROTOMY, EXPLORATORY CELIOTOMY WITH OR WITHOUT BIOPSY(S);  Surgeon: Kyra Phy Day Gladstone Lamer, MD;  Location: MAIN OR [facility];  Service: Trauma    PR EXPLORE PARATHYROID GLANDS N/A 11/11/2019    Procedure: RS 22 PARATHYROIDECTOMY OR EXPLORATION OF PARATHYROID(S);  Surgeon: Thomes Flicker, MD;  Location: MAIN OR [facility];  Service: Surgical Oncology    PR FREEING BOWEL ADHESION,ENTEROLYSIS N/A 11/22/2018    Procedure: Enterolysis (Separt Proc);  Surgeon: Kyra Phy Day Gladstone Lamer, MD;  Location: MAIN OR [facility];  Service: Trauma    PR REMOVE PERITONEAL FOREIGN BODY N/A 11/22/2018    Procedure: Removal Of Peritoneal Of Foreign Body From Peritoneal Cavity;  Surgeon: Kyra Phy Day Gladstone Lamer, MD;  Location: MAIN OR [facility];  Service: Trauma    PR TRANSPLANT,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    Procedure: [facility] STD PREP CAD DONR RENAL ALLOGFT PRIOR TO TRNSPLNT, INCL DISSEC/REM PERINEPH FAT, DIAPH/RTPER ATTAC;  Surgeon: Georgana Kilts, MD;  Location: MAIN OR [facility];  Service: Transplant    PR TRANSPLANTATION OF KIDNEY N/A 05/24/2019    Procedure: RENAL ALLOTRANSPLANTATION, IMPLANTATION OF GRAFT; WITHOUT RECIPIENT NEPHRECTOMY;  Surgeon: Georgana Kilts, MD;  Location: MAIN OR [facility];  Service: Transplant     No current facility-administered medications for this encounter.    Current Outpatient Medications:     chlorhexidine (PERIDEX) 0.12 % solution, , Disp: , Rfl:     DENTA 5000 PLUS 1.1 % Crea, USE DAY AND NIGHT, BRUSH THOROUGHLY FOR 2 MINS AND SPIT OUT. DO NOT SWALLOW AND RINSE LIGHTLY (Patient not taking: Reported on 02/07/2024), Disp: , Rfl:     lidocaine (LIDODERM) 5 % patch, Place 1 patch on the skin every twelve (12) hours. Apply to affected area for 12 hours only each day (then remove patch), Disp: 10 patch, Rfl: 5    magnesium oxide-Mg AA chelate (MAGNESIUM, AMINO ACID CHELATE,) 133 mg, Take 1 tablet by mouth two (2) times a day., Disp: 60 tablet, Rfl: 11    metoPROLOL tartrate (LOPRESSOR) 25 MG tablet, Take 0.5 tablets (12.5 mg total) by mouth two (2) times a day., Disp: 30 tablet, Rfl: 11    MYFORTIC 180 mg EC tablet, Take 3 tablets (540 mg total) by mouth Two (2) times a day., Disp: 180 tablet, Rfl: 11    tacrolimus (ENVARSUS XR) 0.75 mg Tb24 extended release tablet, Take 2 tablets (1.5 mg total) by mouth daily., Disp: 60 tablet, Rfl: 11    Allergies  Patient has no known allergies.    Family History   Problem Relation Age of Onset    Hypertension Mother     Kidney disease Mother     Diabetes Mother     Hypertension Father     Kidney disease Sister         s/p transplant    Hypertension Brother     Glaucoma Neg Hx     Amblyopia Neg Hx     Blindness Neg Hx     Retinal detachment Neg Hx     Strabismus Neg Hx     Macular degeneration Neg Hx     Basal cell carcinoma Neg Hx     Melanoma Neg Hx     Squamous cell carcinoma Neg Hx      Social History  Social History     Tobacco Use    Smoking status: Former     Current packs/day: 0.00     Average packs/day: 1 pack/day for 30.0 years (30.0 ttl pk-yrs)     Types: Cigarettes     Start date: 07/11/1979     Quit date: 07/10/2009     Years since quitting: 14.8     Passive exposure: Never    Smokeless tobacco: Never   Vaping Use    Vaping status: Never Used   Substance Use Topics    Alcohol use: No     Alcohol/week: 0.0 standard drinks of alcohol    Drug use: No      Physical Exam     This provider entered the patient's room: Yes:    If this provider did not enter the room, a comprehensive physical exam was not able to be performed due to increased infection risk to themselves, other providers, staff and other patients), as well as to conserve personal protective equipment (PPE) utilization during the COVID-19 pandemic.    If this provider did enter the patient room, the following was PPE worn: Surgical mask, eye protection and gloves     ED Triage Vitals [05/03/24 0606]   Enc Vitals Group      BP 140/79      Pulse 70      SpO2 Pulse  70      Resp 18      Temp 36.6 ??C (97.8 ??F)      Temp Source Oral      SpO2 100 %     Constitutional: Alert and oriented. Well appearing and in no distress.  Eyes: Conjunctivae are normal.  ENT       Head: Normocephalic and atraumatic.       Nose: No congestion.       Mouth/Throat: Mucous membranes are moist.       Neck: No stridor.  Hematological/Lymphatic/Immunilogical: No cervical lymphadenopathy.  Cardiovascular: Normal rate, regular rhythm. Normal and symmetric distal pulses are present in all extremities.  Respiratory: Normal respiratory effort. Breath sounds are normal.  Gastrointestinal: Soft and nontender. There is no CVA tenderness.  Musculoskeletal: Normal range of motion in all extremities.       Right lower leg: No tenderness or edema.       Left lower leg: No tenderness or edema.  Neurologic: Normal speech and language. No gross focal neurologic deficits are appreciated.  Skin: Skin is warm, dry and intact. No rash noted.  Psychiatric: Mood and affect are normal. Speech and behavior are normal.     Radiology     XR Chest Portable   Final Result      No acute abnormalities.               Procedures     None    Documentation assistance was provided by Oyinda Ajasa, Scribe, on May 03, 2024 at 7:52 AM for Tanja Fang, MD.     May 03, 2024 10:25 AM. Documentation assistance provided by the scribe. I was present during the time the encounter was recorded. The information recorded by the scribe was done at my direction and has been reviewed and validated by me.        French Jester, MD  05/03/24 1027

## 2024-05-03 NOTE — Unmapped (Signed)
 Copied from CRM #[id number]. Topic: Scheduling - New Appointment  >> May 03, 2024 12:04 PM Elgin Grit T wrote:  Caller asked to schedule a new appointment      Additional Requests/Needs: Yes Appointment Related: Reason of the Call: Schedule appointment    Requesting: Needs appointment for ED follow up.  ED [facility]) follow for heart issues, needs appointment in 1 week    Supporting details:    Appointment: No appointments available      The patient preferred contact: Cell Phone  Routine callback turnaround time: 24-48 business hours. Programmer, systems Notified)

## 2024-05-03 NOTE — Unmapped (Signed)
Bed: 14   Expected date:   Expected time:   Means of arrival:   Comments:  ems

## 2024-05-07 NOTE — Unmapped (Signed)
 [facility] Delivery Pharmacy Refill Coordination Note    Specialty Medication(s) to be Shipped:   Transplant: Envarsus xr0.75 mg and Myfortic 180 mg    Other medication(s) to be shipped: magnesium 133 mg tabs, metoprolol 25 mg      Louis Dennis, DOB: [birth year]  Phone: [phone] (home)       All above HIPAA information was verified with patient.     Was a Nurse, learning disability used for this call? No    Completed refill call assessment today to schedule patient's medication shipment from the [facility] Delivery Pharm[facility]  [phone]).  All relevant notes have been reviewed.     Specialty medication(s) and dose(s) confirmed: Regimen is correct and unchanged.   Changes to medications: Louis Dennis reports no changes at this time.  Changes to insurance: No  New side effects reported not previously addressed with a pharmacist or physician: None reported  Questions for the pharmacist: No    Confirmed patient received a Conservation officer, historic buildings and a Surveyor, mining with first shipment. The patient will receive a drug information handout for each medication shipped and additional FDA Medication Guides as required.       DISEASE/MEDICATION-SPECIFIC INFORMATION        N/A    SPECIALTY MEDICATION ADHERENCE                Were doses missed due to medication being on hold? No    MYFORTIC 180 mg EC tablet (mycophenolate)  6 days of medicine on hand   ENVARSUS XR 0.75 mg Tb24 extended release tablet (tacrolimus)  6 days of medicine on hand       REFERRAL TO PHARMACIST     Referral to the pharmacist: Not needed      [facility]     Shipping address confirmed in Epic.     Cost and Payment: Patient has a copay of $7.13. They are aware and have authorized the pharmacy to charge the credit card on file.    Delivery Scheduled: Yes, Expected medication delivery date: 05/08/2024.     Medication will be delivered via Same Day Courier to the prescription address in Epic WAM.    Louis Dennis   [place] [facility]  Specialty Technician

## 2024-05-08 MED FILL — MYFORTIC 180 MG TABLET,DELAYED RELEASE: ORAL | 30 days supply | Qty: 180 | Fill #4

## 2024-05-08 MED FILL — METOPROLOL TARTRATE 25 MG TABLET: ORAL | 30 days supply | Qty: 30 | Fill #9

## 2024-05-08 MED FILL — ENVARSUS XR 0.75 MG TABLET,EXTENDED RELEASE: ORAL | 30 days supply | Qty: 60 | Fill #6

## 2024-05-08 MED FILL — MG-PLUS-PROTEIN 133 MG TABLET: ORAL | 30 days supply | Qty: 60 | Fill #9

## 2024-05-13 NOTE — Unmapped (Signed)
 Blood Culture #1  Order: [id number]   Status: Final result      Blood Culture #2  Order: [id number]   Status: Final result      Final results reviewed. Both blood cultures are negative. No further action needed.

## 2024-05-14 ENCOUNTER — Ambulatory Visit: Admit: 2024-05-14 | Discharge: 2024-05-15 | Payer: MEDICARE

## 2024-05-14 LAB — CBC W/ AUTO DIFF
BASOPHILS ABSOLUTE COUNT: 0 10*9/L (ref 0.0–0.1)
BASOPHILS RELATIVE PERCENT: 0.9 %
EOSINOPHILS ABSOLUTE COUNT: 0.3 10*9/L (ref 0.0–0.5)
EOSINOPHILS RELATIVE PERCENT: 6.7 %
HEMATOCRIT: 43.3 % (ref 39.0–48.0)
HEMOGLOBIN: 14.5 g/dL (ref 12.9–16.5)
LYMPHOCYTES ABSOLUTE COUNT: 0.5 10*9/L — ABNORMAL LOW (ref 1.1–3.6)
LYMPHOCYTES RELATIVE PERCENT: 9.4 %
MEAN CORPUSCULAR HEMOGLOBIN CONC: 33.4 g/dL (ref 32.0–36.0)
MEAN CORPUSCULAR HEMOGLOBIN: 30.6 pg (ref 25.9–32.4)
MEAN CORPUSCULAR VOLUME: 91.5 fL (ref 77.6–95.7)
MEAN PLATELET VOLUME: 8.8 fL (ref 6.8–10.7)
MONOCYTES ABSOLUTE COUNT: 0.7 10*9/L (ref 0.3–0.8)
MONOCYTES RELATIVE PERCENT: 13.9 %
NEUTROPHILS ABSOLUTE COUNT: 3.4 10*9/L (ref 1.8–7.8)
NEUTROPHILS RELATIVE PERCENT: 69.1 %
NUCLEATED RED BLOOD CELLS: 0 /100{WBCs} (ref <=4)
PLATELET COUNT: 155 10*9/L (ref 150–450)
RED BLOOD CELL COUNT: 4.74 10*12/L (ref 4.26–5.60)
RED CELL DISTRIBUTION WIDTH: 15.2 % (ref 12.2–15.2)
WBC ADJUSTED: 4.9 10*9/L (ref 3.6–11.2)

## 2024-05-14 LAB — BASIC METABOLIC PANEL
ANION GAP: 11 mmol/L (ref 5–14)
BLOOD UREA NITROGEN: 21 mg/dL (ref 9–23)
BUN / CREAT RATIO: 29
CALCIUM: 9.5 mg/dL (ref 8.7–10.4)
CHLORIDE: 106 mmol/L (ref 98–107)
CO2: 26.7 mmol/L (ref 20.0–31.0)
CREATININE: 0.73 mg/dL (ref 0.73–1.18)
EGFR CKD-EPI (2021) MALE: >90 mL/min/1.73m2 (ref >=60)
GLUCOSE RANDOM: 125 mg/dL (ref 70–179)
POTASSIUM: 4.4 mmol/L (ref 3.4–4.8)
SODIUM: 144 mmol/L (ref 135–145)

## 2024-05-14 LAB — MAGNESIUM: MAGNESIUM: 1.9 mg/dL (ref 1.6–2.6)

## 2024-05-14 LAB — PHOSPHORUS: PHOSPHORUS: 3.1 mg/dL (ref 2.4–5.1)

## 2024-05-14 LAB — TACROLIMUS LEVEL, TROUGH: TACROLIMUS, TROUGH: 7.9 ng/mL (ref 5.0–15.0)

## 2024-05-15 NOTE — Unmapped (Unsigned)
 Copied from CRM [id number]. Topic: Access To Clinicians - Req Clinic Call Back  >> May 15, 2024 12:42 PM Bambi Bonine wrote:  Reason of the Call: Patient is needing to set up an ER follow up with Dudley Ghee. Not available to come in today, please see if possible to assist.      The patient preferred contact: Cell Phone  Routine callback turnaround time: 24-48 business hours. Programmer, systems Notified)

## 2024-05-16 NOTE — Unmapped (Signed)
 Called pt with interpreter, they left message for patient to call back amyers

## 2024-05-17 ENCOUNTER — Ambulatory Visit: Admit: 2024-05-17 | Discharge: 2024-05-17 | Payer: MEDICARE | Attending: Nephrology | Primary: Nephrology

## 2024-05-17 ENCOUNTER — Inpatient Hospital Stay: Admit: 2024-05-17 | Discharge: 2024-05-17 | Payer: MEDICARE

## 2024-05-17 DIAGNOSIS — M5430 Sciatica, unspecified side: Principal | ICD-10-CM

## 2024-05-17 DIAGNOSIS — Z940 Kidney transplant status: Principal | ICD-10-CM

## 2024-05-17 LAB — URINALYSIS WITH MICROSCOPY WITH CULTURE REFLEX PERFORMABLE
BACTERIA: NONE SEEN /HPF
BILIRUBIN UA: NEGATIVE
BLOOD UA: NEGATIVE
GLUCOSE UA: NEGATIVE
KETONES UA: NEGATIVE
LEUKOCYTE ESTERASE UA: NEGATIVE
NITRITE UA: NEGATIVE
PH UA: 6.5 (ref 5.0–9.0)
PROTEIN UA: NEGATIVE
RBC UA: <1 /HPF (ref <=3)
SPECIFIC GRAVITY UA: 1.018 (ref 1.003–1.030)
SQUAMOUS EPITHELIAL: <1 /HPF (ref 0–5)
UROBILINOGEN UA: <2
WBC UA: <1 /HPF (ref <=2)

## 2024-05-17 MED ORDER — GABAPENTIN 100 MG CAPSULE
ORAL_CAPSULE | Freq: Every evening | ORAL | 3 refills | 90.00000 days | Status: CP
Start: 2024-05-17 — End: 2025-05-17
  Filled 2024-05-22: qty 90, 90d supply, fill #0

## 2024-05-17 NOTE — Unmapped (Signed)
 Transplant Nephrology Clinic Visit    History of Present Illness  [name] is a 63 y.o. 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  -Adequate perfusion of the transplant kidney. Resistive indices within the mid main renal artery and at the anastomosis are slightly elevated. Attention on follow-up.       Subjective/Interval:     He was in ED in May for severe HTN  BP at home has been better, 120s/60s    He thinks he had viral URI earlier this week. He took an over the counter medicine which did not help much, but he is feeling better.  No diarrhea, no tremors.    Tolerating envarsus  and Myfortic  540 mg BID            Review of Systems  Otherwise as per HPI, all other systems reviewed and are negative.    Medications  Current Outpatient Medications   Medication Sig Dispense Refill    chlorhexidine  (PERIDEX ) 0.12 % solution       DENTA 5000 PLUS  1.1 % Crea USE DAY AND NIGHT, BRUSH THOROUGHLY FOR 2 MINS AND SPIT OUT. DO NOT SWALLOW AND RINSE LIGHTLY (Patient not taking: Reported on 02/07/2024)      lidocaine  (LIDODERM ) 5 % patch Place 1 patch on the skin every twelve (12) hours. Apply to affected area for 12 hours only each day (then remove patch) 10 patch 5    magnesium  oxide-Mg AA chelate (MAGNESIUM , AMINO ACID CHELATE,) 133 mg Take 1 tablet by mouth two (2) times a day. 60 tablet 11    metoPROLOL  tartrate (LOPRESSOR ) 25 MG tablet Take 0.5 tablets (12.5 mg total) by mouth two (2) times a day. 30 tablet 11    MYFORTIC  180 mg EC tablet Take 3 tablets (540 mg total) by mouth Two (2) times a day. 180 tablet 11    tacrolimus  (ENVARSUS  XR) 0.75 mg Tb24 extended release tablet Take 2 tablets (1.5 mg total) by mouth daily. 60 tablet 11     No current facility-administered medications for this visit.     Physical Exam  BP 116/66 (BP Site: R Arm, BP Position: Sitting, BP Cuff Size: Medium)  - Pulse 80  - Temp 36.1 ??C (97 ??F) (Temporal)  - Wt 62.5 kg (137 lb 12.8 oz)  - BMI 24.41 kg/m??     General: no acute distress  HEENT: mucous membranes moist  Neck: neck supple, no cervical lymphadenopathy appreciated  CV: normal rate, normal rhythm, no murmur, no gallops, no rubs appreciated  Lungs: clear to auscultation bilaterally  Abdomen: soft, non tender  Extremities:  no edema  Musculoskeletal: no visible deformity, normal range  of motion.  Pulses: intact distally throughout  Neurologic: awake, alert, and oriented x3          Laboratory Data and Imaging reviewed in EPIC    Assessment: [name] is a 63 y.o. 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       Recommendations/Plan:   Allograft Function, status post DDKT: renal function stable with Cr 0.73 on 05/14/24 with a baseline of approximately 0.7-0.9 since transplant. Will continue to monitor.  UPC 0.147g on 05/18/23, UA normal today. No DSAs in May 2025  [facility] clinic for general nephrology, he declined    Immunosuppression Management [High Risk Medical Decision Making For Drug  Therapy Requiring Intensive Monitoring For Toxicity]:  Continue Envarsus  1.5 mg daily. Targeting tacrolimus  trough level of approximately 5-7 ng/mL.  Continue Myfortic  540 mg BID.   Tacrolimus , Trough (ng/mL)   Date Value   05/14/2024 7.9   04/15/2024 6.6   03/14/2024 6.4        Blood Pressure Management: normotensive, OK to continue metoprolol  12.5 mg BID    Pre-diabetes: . Home BG overall controlled. POC HbA1c 6.5--repeat with January labs and consider adding metformin     Hypercalcemia: Last Ca normal at 9.5 on 05/14/24  - s/p parathyroid  surgery - 11/11/2019    Gout: no flare or activity since ~2016. He has been off of allopurinol .    Lower Back Pain with midline radiculopathy: Known underlying degenerative changes. Resolved per patient. Left leg sciatic neuropathic pain--start gaba 100 mg nightly    Infectious prophylaxis and monitoring: BK PCR negative, CMV PCR negative on 04/15/24    Health Maintenance:   - Colonoscopy due 2029  - Blood work once a month  -Covid vaccine today    Immunizations:  Immunization History   Administered Date(s) Administered    COVID-19 VACCINE,MRNA(MODERNA)(PF) 04/12/2020, 05/10/2020, 08/15/2020, 03/17/2021    Covid-19 Vac, (43yr+) (Comirnaty) Mrna Pfizer  11/17/2023    Covid-19 Vac, (69yr+) (Spikevax) Monovalent Moderna 11/11/2022    INFLUENZA INJ MDCK PF, QUAD,(FLUCELVAX )( AND UP EGG FREE) 09/04/2017, 08/31/2020, 08/30/2021    INFLUENZA TIV (TRI) PF (IM)(HISTORICAL) 08/17/2011, 08/08/2012, 08/05/2013, 08/28/2014, 08/11/2015, 08/29/2015    INFLUENZA VACCINE IIV3(IM)(PF)6 MOS UP 10/10/2023    Influenza Vaccine Quad(IM)6 MO-Adult(PF) 08/16/2015, 09/06/2016, 09/11/2017, 08/06/2019, 11/07/2022    Influenza Virus Vaccine, unspecified formulation 08/30/2016, 09/18/2017, 09/04/2018, 10/02/2018    PNEUMOCOCCAL POLYSACCHARIDE 23-VALENT 07/06/2015    PPD Test 09/08/2011, 09/07/2012, 09/05/2013, 09/09/2014, 09/07/2015, 08/10/2016, 09/11/2017, 09/05/2018, 01/03/2019    Pneumococcal Conjugate 13-Valent 07/24/2010, 07/15/2015    Pneumococcal Conjugate 20-valent 08/30/2021    TdaP 12/31/2019         Return in about 6 months (around 11/16/2024).        Oneil Loader, DO  Division of Nephrology and Hypertension  [facility]  05/17/2024  2:14 PM

## 2024-05-17 NOTE — Unmapped (Signed)
 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          05/17/24 1349   BP: 116/66   Pulse: 80   Temp: 36.1 ??C (97 ??F)   Weight: 62.5 kg (137 lb 12.8 oz)   PainSc: 0-No pain       Assessment    ED visit - 5/30 for hypertension, workup negative    Home bp's 110's/60's    Recent virus including sore throat, symptoms improving    Left lateral leg pins and needles/tingling, using lidocaine  patch for this    Good appetite; reports adequate hydration.       Functional Score: 100   Normal no complaints; no evidence of  disease.       I spent a total of 20 minutes with Eion Hladik reviewing medications and symptoms.

## 2024-05-20 DIAGNOSIS — Z940 Kidney transplant status: Principal | ICD-10-CM

## 2024-05-29 DIAGNOSIS — Z09 Encounter for follow-up examination after completed treatment for conditions other than malignant neoplasm: Principal | ICD-10-CM

## 2024-05-29 NOTE — Unmapped (Signed)
 Assessment and Plan:     Diagnoses and all orders for this visit:    Follow-up exam    -Visit for follow up--ER (05/03/24)  visit for chest pains/chills. Investigations at ER negative for acute etiologies.  - Since that time, patient feeling well.  Performing activities at home and walking without chest pain, shortness of breath syncope, dizziness  - History of renal transplant, seen by transplant provider May 17, 2024--no changes in doses to tacrolimus , Myfortic , metoprolol   -A1c 6.5, LDL 99, TC 165--diet advice, cut out carbs/sugars, continue activity  -The 10-year ASCVD risk score (Arnett DK, et al., 2019) is: 9.7%  At this time, pt deferred start of statin, expectant management at this time      Barriers to recommended plan: None identified    Return for 4 months with PCP .      Subjective:     HPI: Louis Dennis is a 63 y.o. male here for No chief complaint on file..    ER follow up:   Pt attended ER 05/03/24 for chest pains/chills: investigations including troponins, chest x-ray, blood cultures: negative for acute etiologies  Pt states he is feeling well , engaging in 30 to 60 minutes of exercise most days without chest pains, dizziness or shortness of breath.  Diet consist mostly of protein and vegetables.  No tobacco, no alcohol.  Patient was recently seen by transplant provider 05/17/24: A1c 6.5, tolerating tacrolimus  and myfortic   Adherent with 12.5 mg metoprolol  BID, reviewed chest CT results--calcification noted, cholesterol panel: LDL 99, TC 165  K 4.4, Cr 9.26  The 10-year ASCVD risk score (Arnett DK, et al., 2019) is: 9.7%     [place] interpreter used  I have reviewed past medical, surgical, medications, allergies, social and family histories today and updated them in Epic where appropriate.    ROS:   Review of Systems   Constitutional: Negative.    Respiratory: Negative.     Cardiovascular: Negative.    Psychiatric/Behavioral: Negative.     All other systems reviewed and are negative.       Wt Readings from Last 6 Encounters:   05/29/24 63.4 kg (139 lb 11.2 oz)   05/17/24 62.5 kg (137 lb 12.8 oz)   02/07/24 62.1 kg (137 lb)   11/17/23 64.4 kg (142 lb)   10/24/23 60.8 kg (134 lb)   10/10/23 61.7 kg (136 lb)      Review of systems negative unless otherwise noted as per HPI.      Objective:     Vitals:    05/29/24 1604   BP: 116/70   Pulse: 76   Temp: 36.7 ??C (98 ??F)   SpO2: 98%     Body mass index is 24.75 kg/m??.    Physical Exam  Vitals and nursing note reviewed.   Constitutional:       Appearance: Normal appearance. He is normal weight.   HENT:      Head: Normocephalic and atraumatic.     Cardiovascular:      Rate and Rhythm: Normal rate and regular rhythm.      Pulses: Normal pulses.      Heart sounds: Normal heart sounds.   Pulmonary:      Effort: Pulmonary effort is normal.      Breath sounds: Normal breath sounds.     Musculoskeletal:         General: Normal range of motion.      Cervical back: Normal range of motion and neck supple.  Skin:     General: Skin is warm.      Capillary Refill: Capillary refill takes less than 2 seconds.     Neurological:      General: No focal deficit present.      Mental Status: He is alert and oriented to person, place, and time. Mental status is at baseline.     Psychiatric:         Mood and Affect: Mood normal.         Behavior: Behavior normal.         Thought Content: Thought content normal.         Judgment: Judgment normal.            Medication adherence and barriers to the treatment plan have been addressed. Opportunities to optimize healthy behaviors have been discussed. Patient / caregiver voiced understanding.   I personally spent 30 minutes face-to-face and non-face-to-face in the care of this patient, which includes all pre, intra, and post visit time on the date of service.  [name], DNP, FNP-C  [facility]  [phone] (F)    Note - This record has been created using [employer]. Chart creation errors have been sought, but may not always have been located. Such creation errors do not reflect on the standard of medical care.

## 2024-05-30 NOTE — Unmapped (Signed)
 [facility] Specialty and Home Delivery Pharmacy Refill Coordination Note    Specialty Medication(s) to be Shipped:   Transplant: Envarsus  XR 0.75 mg and Myfortic  180 mg    Other medication(s) to be shipped: No additional medications requested for fill at this time     Louis Dennis, DOB: July 03, 1961  Phone: [phone] (home)       All above HIPAA information was verified with patient.     Was a Nurse, learning disability used for this call? No    Completed refill call assessment today to schedule patient's medication shipment from the [facility] and [facility] Delivery [facility]  [phone]).  All relevant notes have been reviewed.     Specialty medication(s) and dose(s) confirmed: Regimen is correct and unchanged.   Changes to medications: Louis Dennis reports no changes at this time.  Changes to insurance: No  New side effects reported not previously addressed with a pharmacist or physician: None reported  Questions for the pharmacist: No    Confirmed patient received a Conservation officer, historic buildings and a Surveyor, mining with first shipment. The patient will receive a drug information handout for each medication shipped and additional FDA Medication Guides as required.       DISEASE/MEDICATION-SPECIFIC INFORMATION        N/A    SPECIALTY MEDICATION ADHERENCE     Medication Adherence    Patient reported X missed doses in the last month: 0  Specialty Medication: ENVARSUS  XR 0.75 mg Tb24 extended release tablet (tacrolimus )  Patient is on additional specialty medications: Yes  Additional Specialty Medications: MYFORTIC  180 mg EC tablet (mycophenolate )  Patient Reported Additional Medication X Missed Doses in the Last Month: 0  Patient is on more than two specialty medications: No              Were doses missed due to medication being on hold? No    ENVARSUS  XR 0.75 mg Tb24 extended release tablet (tacrolimus )  5 days of medicine on hand   MYFORTIC  180 mg EC tablet (mycophenolate )  8 days of medicine on hand     REFERRAL TO PHARMACIST     Referral to the pharmacist: Not needed      [facility]     Shipping address confirmed in Epic.     Cost and Payment: Patient has a $0 copay, payment information is not required.    Delivery Scheduled: Yes, Expected medication delivery date: 05/31/2024.     Medication will be delivered via Same Day Courier to the prescription address in Epic WAM.    [facility]  Specialty Technician

## 2024-05-31 MED FILL — ENVARSUS XR 0.75 MG TABLET,EXTENDED RELEASE: ORAL | 30 days supply | Qty: 60 | Fill #7

## 2024-05-31 MED FILL — MYFORTIC 180 MG TABLET,DELAYED RELEASE: ORAL | 30 days supply | Qty: 180 | Fill #5

## 2024-06-14 ENCOUNTER — Encounter: Admit: 2024-06-14 | Discharge: 2024-06-15 | Payer: MEDICARE | Attending: Nephrology | Primary: Nephrology

## 2024-06-14 ENCOUNTER — Ambulatory Visit: Admit: 2024-06-14 | Discharge: 2024-06-15 | Payer: MEDICARE

## 2024-06-14 LAB — URINALYSIS WITH MICROSCOPY WITH CULTURE REFLEX PERFORMABLE
BACTERIA: NONE SEEN /HPF
BILIRUBIN UA: NEGATIVE
BLOOD UA: NEGATIVE
GLUCOSE UA: NEGATIVE
KETONES UA: NEGATIVE
LEUKOCYTE ESTERASE UA: NEGATIVE
NITRITE UA: NEGATIVE
PH UA: 6 (ref 5.0–9.0)
PROTEIN UA: NEGATIVE
RBC UA: 1 /HPF (ref <=3)
SPECIFIC GRAVITY UA: 1.018 (ref 1.003–1.030)
SQUAMOUS EPITHELIAL: <1 /HPF (ref 0–5)
UROBILINOGEN UA: <2
WBC UA: 1 /HPF (ref <=2)

## 2024-06-14 LAB — BASIC METABOLIC PANEL
ANION GAP: 13 mmol/L (ref 5–14)
BLOOD UREA NITROGEN: 19 mg/dL (ref 9–23)
BUN / CREAT RATIO: 29
CALCIUM: 9.5 mg/dL (ref 8.7–10.4)
CHLORIDE: 102 mmol/L (ref 98–107)
CO2: 27.7 mmol/L (ref 20.0–31.0)
CREATININE: 0.66 mg/dL — ABNORMAL LOW (ref 0.73–1.18)
EGFR CKD-EPI (2021) MALE: >90 mL/min/1.73m2 (ref >=60)
GLUCOSE RANDOM: 133 mg/dL (ref 70–179)
POTASSIUM: 4 mmol/L (ref 3.4–4.8)
SODIUM: 143 mmol/L (ref 135–145)

## 2024-06-14 LAB — CBC W/ AUTO DIFF
BASOPHILS ABSOLUTE COUNT: 0 10*9/L (ref 0.0–0.1)
BASOPHILS RELATIVE PERCENT: 1.3 %
EOSINOPHILS ABSOLUTE COUNT: 0.3 10*9/L (ref 0.0–0.5)
EOSINOPHILS RELATIVE PERCENT: 7.2 %
HEMATOCRIT: 45.4 % (ref 39.0–48.0)
HEMOGLOBIN: 15.3 g/dL (ref 12.9–16.5)
LYMPHOCYTES ABSOLUTE COUNT: 0.5 10*9/L — ABNORMAL LOW (ref 1.1–3.6)
LYMPHOCYTES RELATIVE PERCENT: 13.1 %
MEAN CORPUSCULAR HEMOGLOBIN CONC: 33.7 g/dL (ref 32.0–36.0)
MEAN CORPUSCULAR HEMOGLOBIN: 30.5 pg (ref 25.9–32.4)
MEAN CORPUSCULAR VOLUME: 90.6 fL (ref 77.6–95.7)
MEAN PLATELET VOLUME: 9 fL (ref 6.8–10.7)
MONOCYTES ABSOLUTE COUNT: 0.4 10*9/L (ref 0.3–0.8)
MONOCYTES RELATIVE PERCENT: 10.2 %
NEUTROPHILS ABSOLUTE COUNT: 2.6 10*9/L (ref 1.8–7.8)
NEUTROPHILS RELATIVE PERCENT: 68.2 %
NUCLEATED RED BLOOD CELLS: 0 /100{WBCs} (ref <=4)
PLATELET COUNT: 160 10*9/L (ref 150–450)
RED BLOOD CELL COUNT: 5 10*12/L (ref 4.26–5.60)
RED CELL DISTRIBUTION WIDTH: 14.5 % (ref 12.2–15.2)
WBC ADJUSTED: 3.7 10*9/L (ref 3.6–11.2)

## 2024-06-14 LAB — MAGNESIUM: MAGNESIUM: 2 mg/dL (ref 1.6–2.6)

## 2024-06-14 LAB — TACROLIMUS LEVEL, TROUGH: TACROLIMUS, TROUGH: 3.8 ng/mL — ABNORMAL LOW (ref 5.0–15.0)

## 2024-06-14 LAB — PHOSPHORUS: PHOSPHORUS: 2.5 mg/dL (ref 2.4–5.1)

## 2024-06-18 DIAGNOSIS — Z940 Kidney transplant status: Principal | ICD-10-CM

## 2024-06-18 NOTE — Unmapped (Signed)
 Called patient regarding low Tac level.  Per Dr Leobardo, he will repeat labs next week.  He is aware to make no changes at this time to Tac dose and that team will reach out if anything needs to be done.

## 2024-06-24 ENCOUNTER — Ambulatory Visit: Admit: 2024-06-24 | Discharge: 2024-06-25 | Payer: MEDICARE

## 2024-06-24 ENCOUNTER — Encounter: Admit: 2024-06-24 | Discharge: 2024-06-25 | Payer: MEDICARE | Attending: Nephrology | Primary: Nephrology

## 2024-06-24 LAB — TACROLIMUS LEVEL, TROUGH: TACROLIMUS, TROUGH: 7.7 ng/mL (ref 5.0–15.0)

## 2024-06-25 DIAGNOSIS — D849 Immunodeficiency, unspecified: Principal | ICD-10-CM

## 2024-06-25 DIAGNOSIS — Z940 Kidney transplant status: Principal | ICD-10-CM

## 2024-06-25 MED ORDER — MG-PLUS-PROTEIN 133 MG TABLET
ORAL_TABLET | Freq: Two times a day (BID) | ORAL | 11 refills | 30.00000 days | Status: CP
Start: 2024-06-25 — End: 2025-06-25
  Filled 2024-06-28: qty 60, 30d supply, fill #0

## 2024-06-25 MED ORDER — METOPROLOL TARTRATE 25 MG TABLET
ORAL_TABLET | Freq: Two times a day (BID) | ORAL | 11 refills | 30.00000 days | Status: CP
Start: 2024-06-25 — End: 2025-06-25
  Filled 2024-06-28: qty 30, 30d supply, fill #0

## 2024-06-25 NOTE — Unmapped (Signed)
 [facility] Specialty and Home Delivery Pharmacy Refill Coordination Note    Specialty Medication(s) to be Shipped:   Transplant: Envarsus  XR 0.75 mg and Myfortic  180 mg    Other medication(s) to be shipped: No additional medications requested for fill at this time     Louis Dennis, DOB: [birth year]  Phone: [phone] (home)       All above HIPAA information was verified with patient.     Was a Nurse, learning disability used for this call? No    Completed refill call assessment today to schedule patient's medication shipment from the [facility] and Home Delivery Pharmacy  [phone]).  All relevant notes have been reviewed.     Specialty medication(s) and dose(s) confirmed: Regimen is correct and unchanged.   Changes to medications: Louis Dennis reports no changes at this time.  Changes to insurance: No  New side effects reported not previously addressed with a pharmacist or physician: None reported  Questions for the pharmacist: No    Confirmed patient received a Conservation officer, historic buildings and a Surveyor, mining with first shipment. The patient will receive a drug information handout for each medication shipped and additional FDA Medication Guides as required.       DISEASE/MEDICATION-SPECIFIC INFORMATION        N/A    SPECIALTY MEDICATION ADHERENCE     Medication Adherence    Patient reported X missed doses in the last month: 0  Specialty Medication: mycophenolate : MYFORTIC  180 mg EC tablet  Patient is on additional specialty medications: Yes  Additional Specialty Medications: tacrolimus : ENVARSUS  XR 0.75 mg Tb24 extended release tablet  Patient Reported Additional Medication X Missed Doses in the Last Month: 0  Patient is on more than two specialty medications: No              Were doses missed due to medication being on hold? No    mycophenolate : MYFORTIC  180 mg EC tablet : 7 days of medicine on hand   tacrolimus : ENVARSUS  XR 0.75 mg Tb24 extended release tablet  7 days of medicine on hand       REFERRAL TO PHARMACIST     Referral to the pharmacist: Not needed      [facility]     Shipping address confirmed in Epic.     Cost and Payment: Patient has a copay of $7.13. They are aware and have authorized the pharmacy to charge the credit card on file.    Delivery Scheduled: Yes, Expected medication delivery date: 06/28/2024.  However, Rx request for refills was sent to the provider as there are none remaining.     Medication will be delivered via Same Day Courier to the prescription address in Epic WAM.    [facility]  Specialty Technician

## 2024-06-28 MED FILL — ENVARSUS XR 0.75 MG TABLET,EXTENDED RELEASE: ORAL | 30 days supply | Qty: 60 | Fill #8

## 2024-06-28 MED FILL — MYFORTIC 180 MG TABLET,DELAYED RELEASE: ORAL | 30 days supply | Qty: 180 | Fill #6

## 2024-07-09 ENCOUNTER — Ambulatory Visit: Admit: 2024-07-09 | Discharge: 2024-07-10 | Payer: MEDICARE

## 2024-07-09 ENCOUNTER — Encounter: Admit: 2024-07-09 | Discharge: 2024-07-10 | Payer: MEDICARE | Attending: Nephrology | Primary: Nephrology

## 2024-07-09 LAB — BASIC METABOLIC PANEL
ANION GAP: 15 mmol/L — ABNORMAL HIGH (ref 5–14)
BLOOD UREA NITROGEN: 20 mg/dL (ref 9–23)
BUN / CREAT RATIO: 28
CALCIUM: 9.5 mg/dL (ref 8.7–10.4)
CHLORIDE: 102 mmol/L (ref 98–107)
CO2: 24.4 mmol/L (ref 20.0–31.0)
CREATININE: 0.72 mg/dL — ABNORMAL LOW (ref 0.73–1.18)
EGFR CKD-EPI (2021) MALE: >90 mL/min/1.73m2 (ref >=60)
GLUCOSE RANDOM: 131 mg/dL (ref 70–179)
POTASSIUM: 4.6 mmol/L (ref 3.4–4.8)
SODIUM: 141 mmol/L (ref 135–145)

## 2024-07-09 LAB — CBC W/ AUTO DIFF
BASOPHILS ABSOLUTE COUNT: 0 10*9/L (ref 0.0–0.1)
BASOPHILS RELATIVE PERCENT: 1.1 %
EOSINOPHILS ABSOLUTE COUNT: 0.3 10*9/L (ref 0.0–0.5)
EOSINOPHILS RELATIVE PERCENT: 7.1 %
HEMATOCRIT: 43.4 % (ref 39.0–48.0)
HEMOGLOBIN: 14.5 g/dL (ref 12.9–16.5)
LYMPHOCYTES ABSOLUTE COUNT: 0.3 10*9/L — ABNORMAL LOW (ref 1.1–3.6)
LYMPHOCYTES RELATIVE PERCENT: 6.6 %
MEAN CORPUSCULAR HEMOGLOBIN CONC: 33.5 g/dL (ref 32.0–36.0)
MEAN CORPUSCULAR HEMOGLOBIN: 30.1 pg (ref 25.9–32.4)
MEAN CORPUSCULAR VOLUME: 89.9 fL (ref 77.6–95.7)
MEAN PLATELET VOLUME: 7.7 fL (ref 6.8–10.7)
MONOCYTES ABSOLUTE COUNT: 0.4 10*9/L (ref 0.3–0.8)
MONOCYTES RELATIVE PERCENT: 9.8 %
NEUTROPHILS ABSOLUTE COUNT: 3.3 10*9/L (ref 1.8–7.8)
NEUTROPHILS RELATIVE PERCENT: 75.4 %
NUCLEATED RED BLOOD CELLS: 0 /100{WBCs} (ref <=4)
PLATELET COUNT: 168 10*9/L (ref 150–450)
RED BLOOD CELL COUNT: 4.83 10*12/L (ref 4.26–5.60)
RED CELL DISTRIBUTION WIDTH: 13.9 % (ref 12.2–15.2)
WBC ADJUSTED: 4.4 10*9/L (ref 3.6–11.2)

## 2024-07-09 LAB — MAGNESIUM: MAGNESIUM: 1.8 mg/dL (ref 1.6–2.6)

## 2024-07-09 LAB — PHOSPHORUS: PHOSPHORUS: 2.7 mg/dL (ref 2.4–5.1)

## 2024-07-10 LAB — TACROLIMUS LEVEL, TROUGH: TACROLIMUS, TROUGH: 7.1 ng/mL (ref 5.0–15.0)

## 2024-07-19 NOTE — Unmapped (Signed)
 [facility] Specialty and Home Delivery Pharmacy Refill Coordination Note    Specialty Medication(s) to be Shipped:   Transplant: Envarsus  0.75mg  and Myfortic  180mg     Other medication(s) to be shipped: magnesium  and metoprolol  25mg     Specialty Medications not needed at this time: N/A     Louis Dennis, DOB: 14-Dec-1960  Phone: [phone] (home)       All above HIPAA information was verified with patient.     Was a Nurse, learning disability used for this call? No    Completed refill call assessment today to schedule patient's medication shipment from the [facility] and Home Delivery Pharmacy  [phone]).  All relevant notes have been reviewed.     Specialty medication(s) and dose(s) confirmed: Regimen is correct and unchanged.   Changes to medications: [name] reports no changes at this time.  Changes to insurance: No  New side effects reported not previously addressed with a pharmacist or physician: None reported  Questions for the pharmacist: No    Confirmed patient received a Conservation officer, historic buildings and a Surveyor, mining with first shipment. The patient will receive a drug information handout for each medication shipped and additional FDA Medication Guides as required.       DISEASE/MEDICATION-SPECIFIC INFORMATION        N/A    SPECIALTY MEDICATION ADHERENCE     Medication Adherence    Patient reported X missed doses in the last month: 0  Specialty Medication: MYFORTIC  180 mg EC tablet (mycophenolate )  Patient is on additional specialty medications: Yes  Additional Specialty Medications: ENVARSUS  XR 0.75 mg Tb24 extended release tablet (tacrolimus )  Patient Reported Additional Medication X Missed Doses in the Last Month: 0  Patient is on more than two specialty medications: No              Were doses missed due to medication being on hold? No      MYFORTIC  180 mg EC tablet (mycophenolate ): 11 days of medicine on hand     ENVARSUS  XR 0.75 mg Tb24 extended release tablet (tacrolimus ): 11 days of medicine on hand       REFERRAL TO PHARMACIST     Referral to the pharmacist: Not needed      [facility]     Shipping address confirmed in Epic.     Cost and Payment: Patient has a copay of $7.13. They are aware and have authorized the pharmacy to charge the credit card on file.    Delivery Scheduled: Yes, Expected medication delivery date: 07/24/2024.     Medication will be delivered via Same Day Courier to the prescription address in Epic WAM.    [facility] and Home Delivery Pharmacy  Specialty Technician

## 2024-07-24 MED FILL — MYFORTIC 180 MG TABLET,DELAYED RELEASE: ORAL | 30 days supply | Qty: 180 | Fill #7

## 2024-07-24 MED FILL — METOPROLOL TARTRATE 25 MG TABLET: ORAL | 30 days supply | Qty: 30 | Fill #1

## 2024-07-24 MED FILL — ENVARSUS XR 0.75 MG TABLET,EXTENDED RELEASE: ORAL | 30 days supply | Qty: 60 | Fill #9

## 2024-07-24 MED FILL — MG-PLUS-PROTEIN 133 MG TABLET: ORAL | 30 days supply | Qty: 60 | Fill #1

## 2024-08-09 ENCOUNTER — Emergency Department: Admit: 2024-08-09 | Discharge: 2024-08-09 | Disposition: A | Discharge status: Home / Self Care | Payer: MEDICARE

## 2024-08-09 DIAGNOSIS — Z940 Kidney transplant status: Principal | ICD-10-CM

## 2024-08-09 DIAGNOSIS — N3000 Acute cystitis without hematuria: Principal | ICD-10-CM

## 2024-08-09 DIAGNOSIS — R1030 Lower abdominal pain, unspecified: Principal | ICD-10-CM

## 2024-08-09 LAB — COMPREHENSIVE METABOLIC PANEL
ALBUMIN: 4.4 g/dL (ref 3.4–5.0)
ALKALINE PHOSPHATASE: 77 U/L (ref 46–116)
ALT (SGPT): 11 U/L (ref 10–49)
ANION GAP: 13 mmol/L (ref 5–14)
AST (SGOT): 16 U/L (ref <=34)
BILIRUBIN TOTAL: 0.9 mg/dL (ref 0.3–1.2)
BLOOD UREA NITROGEN: 10 mg/dL (ref 9–23)
BUN / CREAT RATIO: 14
CALCIUM: 9.8 mg/dL (ref 8.7–10.4)
CHLORIDE: 102 mmol/L (ref 98–107)
CO2: 25.4 mmol/L (ref 20.0–31.0)
CREATININE: 0.69 mg/dL — ABNORMAL LOW (ref 0.73–1.18)
EGFR CKD-EPI (2021) MALE: >90 mL/min/1.73m2 (ref >=60)
GLUCOSE RANDOM: 152 mg/dL (ref 70–179)
POTASSIUM: 4.6 mmol/L (ref 3.4–4.8)
PROTEIN TOTAL: 7.6 g/dL (ref 5.7–8.2)
SODIUM: 140 mmol/L (ref 135–145)

## 2024-08-09 LAB — CBC W/ AUTO DIFF
BASOPHILS ABSOLUTE COUNT: 0 10*9/L (ref 0.0–0.1)
BASOPHILS RELATIVE PERCENT: 0.6 %
EOSINOPHILS ABSOLUTE COUNT: 0.2 10*9/L (ref 0.0–0.5)
EOSINOPHILS RELATIVE PERCENT: 3.3 %
HEMATOCRIT: 42.1 % (ref 39.0–48.0)
HEMOGLOBIN: 14.4 g/dL (ref 12.9–16.5)
LYMPHOCYTES ABSOLUTE COUNT: 0.7 10*9/L — ABNORMAL LOW (ref 1.1–3.6)
LYMPHOCYTES RELATIVE PERCENT: 13.5 %
MEAN CORPUSCULAR HEMOGLOBIN CONC: 34.3 g/dL (ref 32.0–36.0)
MEAN CORPUSCULAR HEMOGLOBIN: 30.5 pg (ref 25.9–32.4)
MEAN CORPUSCULAR VOLUME: 88.8 fL (ref 77.6–95.7)
MEAN PLATELET VOLUME: 7.9 fL (ref 6.8–10.7)
MONOCYTES ABSOLUTE COUNT: 0.5 10*9/L (ref 0.3–0.8)
MONOCYTES RELATIVE PERCENT: 9.4 %
NEUTROPHILS ABSOLUTE COUNT: 3.6 10*9/L (ref 1.8–7.8)
NEUTROPHILS RELATIVE PERCENT: 73.2 %
NUCLEATED RED BLOOD CELLS: 0 /100{WBCs} (ref <=4)
PLATELET COUNT: 190 10*9/L (ref 150–450)
RED BLOOD CELL COUNT: 4.74 10*12/L (ref 4.26–5.60)
RED CELL DISTRIBUTION WIDTH: 14.7 % (ref 12.2–15.2)
WBC ADJUSTED: 4.9 10*9/L (ref 3.6–11.2)

## 2024-08-09 LAB — URINALYSIS WITH MICROSCOPY WITH CULTURE REFLEX PERFORMABLE
BILIRUBIN UA: NEGATIVE
BLOOD UA: NEGATIVE
GLUCOSE UA: NEGATIVE
KETONES UA: NEGATIVE
LEUKOCYTE ESTERASE UA: NEGATIVE
NITRITE UA: NEGATIVE
PH UA: 5 (ref 5.0–9.0)
PROTEIN UA: NEGATIVE
RBC UA: <1 /HPF (ref <=3)
SPECIFIC GRAVITY UA: 1.007 (ref 1.003–1.030)
SQUAMOUS EPITHELIAL: <1 /HPF (ref 0–5)
UROBILINOGEN UA: <2
WBC UA: <1 /HPF (ref <=2)

## 2024-08-09 LAB — LIPASE: LIPASE: 36 U/L (ref 12–53)

## 2024-08-09 MED ORDER — CEPHALEXIN 500 MG TABLET
ORAL_TABLET | Freq: Three times a day (TID) | ORAL | 0 refills | 7.00000 days | Status: CP
Start: 2024-08-09 — End: 2024-08-16

## 2024-08-09 NOTE — Unmapped (Signed)
 Pt coming in POV for abdominal pain for 1 month. Pt states having a lot of burping, RLQ. Went to UC on Monday and given Pepcid and still not gotten better. Recommended to come to ED for CT scan.

## 2024-08-09 NOTE — Unmapped (Signed)
 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 The pain worsens with eating and drinking, is associated with abdominal swelling, and improves when lying down. He denies fever, vomiting, diarrhea, or constipation, and reports mild nocturnal urinary difficulty. Exam revealed mild lower quadrant abdominal tenderness and a large transverse lower abdominal incision site, with no CVA tenderness.    Differential diagnosis includes, but is not limited to:  - Appendicitis: Considered due to right lower quadrant abdominal pain, but the chronicity and lack of systemic symptoms make it less likely.  - Bowel Obstruction: Considered given the abdominal pain, history of abdominal surgery, and swelling with eating, but absence of vomiting or constipation makes it less likely.  - Transplanted Kidney Complication (e.g., rejection, vascular compromise): Considered due to history of kidney transplant and abdominal pain in the region of the graft, with evaluation planned to assess graft function and blood flow.    Abdominal pain in patient with kidney transplant  - Order CT scan of the abdomen  - Order ultrasound of the kidneys to assess blood flow and function    Labs and imaging reviewed:  Labs shows no evidence of significant leukocytosis or acute kidney injury.  Urinalysis notable for finding of positive bacteria without leukocyte esterase or nitrites.  Imaging shows no acute abnormalities on Doppler ultrasound of transplanted kidney.  CT abdomen pelvis without contrast shows no evidence of acute surgical intra-abdominal process.    Discussed results with the patient.  Discussed urinalysis, and offered multiple treatments: 1.  Empiric treatment of urinary tract infection with antibiotics, while awaiting urine culture results for potential discontinuation or antibiotic change or 2.  Withholding antibiotic therapy at this time while awaiting urine culture results and follow-up provider would be able to initiate antibiotics as needed.  Patient verbalized understanding and would like to start antibiotics today.    Discussed results, treatment plan, return precautions for any development of fever, recurrent vomiting, worsening abdominal pain, blood in urine or any other concerning symptoms.  The patient verbalized understanding and agreed.  Discharged in stable condition.        Discussion of Management with other Providers or Support Staff    I discussed the management of this patient with the:  Case discussed with Dr. Charlena, attending physician. He agreed with initial lab/imaging study orders.      Disclaimers:  -Portions of this record have been created using Scientist, clinical (histocompatibility and immunogenetics). Dictation errors have been sought, but may not have been identified and corrected.  -This note was created in part using the AI scribe software [employer]bridge. While efforts have been made to review and edit the content for accuracy, there may be errors, omissions, or misinterpretations due to automated transcription or summarization.     See chart and resident provider documentation for details.    ____________________________________________        HISTORY        Reason for Visit  Abdominal Pain  HPI   History of Present Illness  Louis Dennis is a 63 year old male with a history of kidney transplant who presents with persistent lower abdominal pain.    Abdominal pain has been present for one month, initially in the upper abdomen, shifting to the lower abdomen for the past three weeks. Pain worsens with eating and drinking, accompanied by abdominal swelling, and subsides when lying down. Treatment at urgent care alleviated upper abdominal pain but not lower abdominal pain.    No fever, vomiting, diarrhea, constipation, or hematuria. He experiences nocturia with some difficulty urinating.    He underwent a kidney transplant five years ago and is not on dialysis. Current medications include Mvasi, metoprolol , and magnesium . He has had multiple abdominal surgeries, including for the kidney transplant and dialysis access.      Past Medical History[1]    Problem List[2]    Past Surgical History[3]    No current facility-administered medications for this encounter.    Current Outpatient Medications:     chlorhexidine  (PERIDEX ) 0.12 % solution, , Disp: , Rfl:     DENTA 5000 PLUS  1.1 % Crea, USE DAY AND NIGHT, BRUSH THOROUGHLY FOR 2 MINS AND SPIT OUT. DO NOT SWALLOW AND RINSE LIGHTLY (Patient not taking: Reported on 02/07/2024), Disp: , Rfl:     gabapentin  (NEURONTIN ) 100 MG capsule, Take 1 capsule (100 mg total) by mouth nightly., Disp: 90 capsule, Rfl: 3    lidocaine  (LIDODERM ) 5 % patch, Place 1 patch on the skin every twelve (12) hours. Apply to affected area for 12 hours only each day (then remove patch) (Patient not taking: Reported on 05/29/2024), Disp: 10 patch, Rfl: 5    magnesium  oxide-Mg AA chelate (MAGNESIUM , AMINO ACID CHELATE,) 133 mg, Take 1 tablet by mouth two (2) times a day., Disp: 60 tablet, Rfl: 11    metoPROLOL  tartrate (LOPRESSOR ) 25 MG tablet, Take 1/2 tablet (12.5 mg total) by mouth two (2) times a day., Disp: 30 tablet, Rfl: 11    MYFORTIC  180 mg EC tablet, Take 3 tablets (540 mg total) by mouth Two (2) times a day., Disp: 180 tablet, Rfl: 11    tacrolimus  (ENVARSUS  XR) 0.75 mg Tb24 extended release tablet, Take 2 tablets (1.5 mg total) by mouth daily., Disp: 60 tablet, Rfl: 11    Allergies  Patient has no known allergies.    Family History[4]    Social History  Short Social History[5]      PHYSICAL EXAM       ED Triage Vitals   Enc Vitals Group      BP 08/09/24 1141 106/76      Pulse 08/09/24 1139 65      SpO2 Pulse --       Resp 08/09/24 1139 16      Temp 08/09/24 1139 36.7 ??C (98 ??F)      Temp Source 08/09/24 1139 Temporal      SpO2 08/09/24 1139 100 %      Weight 08/09/24 1139 61 kg (134 lb 6.4 oz)      Height --       Head Circumference --       Peak Flow --       Pain Score --       Pain Loc --       Pain Education --       Exclude from Growth Chart --        Physical Exam  GENERAL: Alert, cooperative, well developed,  no acute distress  HEENT: Normocephalic, normal oropharynx, moist mucous membranes  CHEST: Clear to auscultation bilaterally, no wheezes, rhonchi, or crackles  CARDIOVASCULAR: Normal heart rate and rhythm, S1 and S2 normal without murmurs  ABDOMEN: Soft, mild tenderness in lower quadrant, large transverse lower abdominal incision site, non-distended, without organomegaly, normal bowel sounds, no costovertebral angle tenderness  EXTREMITIES: No cyanosis or edema  NEUROLOGICAL: Cranial nerves grossly intact, moves all extremities without gross motor or sensory deficit        RESULTS       Labs     Results for orders placed or performed during the hospital encounter of 08/09/24   Comprehensive Metabolic Panel   Result Value Ref Range    Sodium 140 135 - 145 mmol/L    Potassium 4.6 3.4 - 4.8 mmol/L    Chloride 102 98 - 107 mmol/L    CO2 25.4 20.0 - 31.0 mmol/L    Anion Gap 13 5 - 14 mmol/L    BUN 10 9 - 23 mg/dL    Creatinine 9.30 (L) 0.73 - 1.18 mg/dL    BUN/Creatinine Ratio 14     eGFR CKD-EPI (2021) Male >90 >=60 mL/min/1.52m2    Glucose 152 70 - 179 mg/dL    Calcium  9.8 8.7 - 10.4 mg/dL    Albumin 4.4 3.4 - 5.0 g/dL    Total Protein 7.6 5.7 - 8.2 g/dL    Total Bilirubin 0.9 0.3 - 1.2 mg/dL    AST 16 <=65 U/L    ALT 11 10 - 49 U/L    Alkaline Phosphatase 77 46 - 116 U/L   Lipase Level   Result Value Ref Range    Lipase 36 12 - 53 U/L   CBC w/ Differential Result Value Ref Range    WBC 4.9 3.6 - 11.2 10*9/L    RBC 4.74 4.26 - 5.60 10*12/L    HGB 14.4 12.9 - 16.5 g/dL    HCT 57.8 60.9 - 51.9 %    MCV 88.8 77.6 - 95.7 fL    MCH 30.5 25.9 - 32.4 pg    MCHC 34.3 32.0 - 36.0 g/dL    RDW 85.2 87.7 - 84.7 %    MPV 7.9 6.8 - 10.7 fL    Platelet 190 150 - 450 10*9/L    nRBC 0 <=4 /100 WBCs    Neutrophils % 73.2 %    Lymphocytes % 13.5 %    Monocytes % 9.4 %    Eosinophils % 3.3 %    Basophils % 0.6 %    Absolute Neutrophils 3.6 1.8 - 7.8 10*9/L    Absolute Lymphocytes 0.7 (L) 1.1 - 3.6 10*9/L    Absolute Monocytes 0.5 0.3 - 0.8 10*9/L    Absolute Eosinophils 0.2 0.0 - 0.5 10*9/L    Absolute Basophils 0.0 0.0 - 0.1 10*9/L   Urinalysis with Microscopy with Culture Reflex   Result Value Ref Range    Color, UA Colorless     Clarity, UA Clear     Specific Gravity, UA 1.007 1.003 - 1.030    pH, UA 5.0 5.0 - 9.0    Leukocyte Esterase, UA Negative Negative    Nitrite, UA Negative Negative    Protein, UA Negative Negative    Glucose, UA Negative Negative    Ketones, UA Negative Negative    Urobilinogen, UA <2.0 mg/dL <7.9 mg/dL    Bilirubin, UA Negative Negative    Blood, UA Negative Negative    RBC, UA <  1 <=3 /HPF    WBC, UA <1 <=2 /HPF    Squam Epithel, UA <1 0 - 5 /HPF    Bacteria, UA Rare (A) None Seen /HPF        Radiology     CT Abdomen Pelvis Without Any  Contrast  Result Date: 08/09/2024  EXAM: CT ABDOMEN PELVIS WO CONTRAST ACCESSION: [id number] UN REPORT DATE: 08/09/2024 2:17 PM CLINICAL INDICATION: 63 years old with RLQ pain, history renal transfer  COMPARISON: CT ABDOMEN PELVIS WO CONTRAST 11/22/2018, CT ABDOMEN PELVIS W CONTRAST 03/16/2017 TECHNIQUE: A spiral CT scan was obtained without IV contrast from the lung bases to the pubic symphysis.  Images were reconstructed in the axial plane. Coronal and sagittal reformatted images were also provided for further evaluation. Evaluation of the solid organs and vasculature is limited in the absence of intravenous contrast. FINDINGS: LOWER CHEST: Visualized lower lungs are clear. No pleural or pericardial effusion. Mitral annular calcifications. LIVER: Normal liver contour. No focal liver lesion on non-contrast examination. BILIARY: The gallbladder is normal in appearance. No intrahepatic biliary ductal dilatation. SPLEEN: Normal in size and contour. PANCREAS: Normal pancreatic contour without signs of inflammation or gross ductal dilatation. Redemonstration of subcentimeter hypoattenuating lesions in the tail and body of the pancreas, stable from prior. ADRENAL GLANDS: Normal appearance of the adrenal glands. KIDNEYS/URETERS: Bilateral atrophic native kidneys. Bilateral simple renal cysts, left greater than right. Nonobstructive bilateral renal calculi as well as vascular calcifications. No ureteral dilatation or collecting system distention. Transplant kidney present in the right lower quadrant. No hydronephrosis or adjacent fluid collection. BLADDER: Unremarkable. REPRODUCTIVE ORGANS: Mild prostatomegaly. Bilateral calcification of the vas deferens and seminal vesicles. GI TRACT: No findings of bowel obstruction or acute inflammation. Diverticulosis without evidence of diverticulitis. Normal appendix. PERITONEUM/RETROPERITONEUM AND MESENTERY: No free air. No ascites. No fluid collection. VASCULATURE: Fusiform infrarenal abdominal aortic ectasia measuring up to 2.5 cm. Extensive calcified aortoiliac and visceral artery plaque. Otherwise, limited evaluation without contrast. LYMPH NODES: No adenopathy. BONES and SOFT TISSUES: No aggressive osseous lesions. Multilevel degenerative changes of the visualized spine. Mild to moderate degenerative changes of the hips. No focal soft tissue lesions.     No acute abnormality within the abdomen/pelvis, within the limitation of noncontrast technique.     US  Renal Transplant W Doppler  Result Date: 08/09/2024  EXAM: US  RENAL TRANSPLANT Louis Dennis ACCESSION: [id number] UN REPORT DATE: 08/09/2024 1:34 PM CLINICAL INDICATION: 63 years old with lower abdominal pain at site of transplant kidney COMPARISON: Renal transplant 05/17/2024 and prior TECHNIQUE:  Ultrasound views of the renal transplant were obtained using gray scale and color and spectral Doppler imaging. Views of the urinary bladder were obtained using gray scale and limited color Doppler imaging. FINDINGS: TRANSPLANTED KIDNEY: The renal transplant was located in the right lower quadrant. Normal size and echogenicity.  No solid masses or calculi. No perinephric collections identified. No hydronephrosis. VESSELS: - Perfusion: Using power Doppler, normal perfusion was seen throughout the renal parenchyma. - Resistive indices in the renal transplant are stable compared with prior examination. - Main renal artery/iliac artery: Patent - Main renal vein/iliac vein: Patent BLADDER: Unremarkable.     No significant change compared with prior study. Stable resistive indices in the renal transplant arteries, within normal limits. No findings to explain patient's symptomatology. Please see below for data measurements: Transplant location: RLQ Renal Transplant: Sagittal 12.8 cm; AP 5.5 cm; Transverse 4.9 cm Segmental artery superior resistive index: 0.67 Segmental artery mid resistive index: 0.72 Segmental artery inferior resistive index:  0.72 Previous resistive indices range of segmental arteries: 0.56-0.72 Main renal artery peak systolic velocity at anastomosis: 1.39 m/s Main renal artery hilum resistive index: 0.69 Main renal artery mid resistive index: 0.70 Main renal artery anastomosis resistive index: 0.69 Previous resistive indices range of main renal artery: 0.68-0.75 Main renal vein: patent Iliac artery: Patent Iliac vein: Patent Bladder volume prevoid: 54.6  mL Bladder volume postvoid: mL       Medications administered this visit     @MEDADMIN @  Procedures                  [1]   Past Medical History:  Diagnosis Date    Abnormal thyroid function test     End stage renal disease    (CMS-HCC)     now on peritoneal dialysis    Gout     reports was in Right hand, foot and left elbow    Hypertension     takes meds intermitttently based on readings    Nephrolithiasis 2014    Visual impairment     glasses   [2]   Patient Active Problem List  Diagnosis    Anemia in chronic kidney disease    ESRD (end stage renal disease)    (CMS-HCC)    Gout    Hypertensive renal disease with renal failure    End stage renal disease    (CMS-HCC)    Hypertension    Abnormal thyroid function test    Multinodular goiter    Family history of diabetes mellitus (DM)    Seborrheic dermatitis of scalp    Follicular cyst of skin    Encephalopathy acute    SBO (small bowel obstruction)    (CMS-HCC)    Swelling of arm    Immunocompromised state (HHS-HCC)    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    pd catheter  2012    PR COLONOSCOPY W/BIOPSY SINGLE/MULTIPLE N/A 06/15/2018    Procedure: COLONOSCOPY, FLEXIBLE, PROXIMAL TO SPLENIC FLEXURE; WITH BIOPSY, SINGLE OR MULTIPLE;  Surgeon: Myrick Izella Keels, MD;  Location: [facility] PROC[facility]URES [facility];  Service: Gastroenterology    PR COLSC FLX W/RMVL OF TUMOR POLYP LESION SNARE TQ N/A 06/15/2018    Procedure: COLONOSCOPY FLEX; W/REMOV TUMOR/LES BY SNARE;  Surgeon: Myrick Izella Keels, MD;  Location: [facility];  Service: Gastroenterology    PR COLSC FLX W/RMVL OF TUMOR POLYP LESION SNARE TQ N/A 10/24/2023    Procedure: COLONOSCOPY FLEX; W/REMOV TUMOR/LES BY SNARE;  Surgeon: Conny Dorn Agent, MD;  Location: [facility] PROCEDURES [facility];  Service: Gastroenterology    PR EXPLORATORY OF ABDOMEN N/A 11/22/2018    Procedure: EXPLORATORY LAPAROTOMY, EXPLORATORY CELIOTOMY WITH OR WITHOUT BIOPSY(S);  Surgeon: Creighton Day Juli Bathe, MD;  Location: MAIN OR [facility];  Service: Trauma    PR EXPLORE PARATHYROID  GLANDS N/A 11/11/2019    Procedure: RS 22 PARATHYROIDECTOMY OR EXPLORATION OF PARATHYROID (S);  Surgeon: Jerilynn Debby Cramp, MD;  Location: MAIN OR [facility];  Service: Surgical Oncology    PR FREEING BOWEL ADHESION,ENTEROLYSIS N/A 11/22/2018    Procedure: Enterolysis (Separt Proc);  Surgeon: Creighton Day Juli Bathe, MD;  Location: MAIN OR [facility];  Service: Trauma    PR REMOVE PERITONEAL FOREIGN  BODY N/A 11/22/2018    Procedure: Removal Of Peritoneal Of Foreign Body From Peritoneal Cavity;  Surgeon: Creighton Day Juli Bathe, MD;  Location: MAIN OR [facility];  Service: Trauma    PR TRANSPLANT,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    Procedure: [facility] STD PREP CAD DONR RENAL ALLOGFT PRIOR TO TRNSPLNT, INCL DISSEC/REM PERINEPH FAT, DIAPH/RTPER ATTAC;  Surgeon: Barkley Coni Stallion, MD;  Location: MAIN OR [place];  Service: Transplant    PR TRANSPLANTATION OF KIDNEY N/A 05/24/2019    Procedure: RENAL ALLOTRANSPLANTATION, IMPLANTATION OF GRAFT; WITHOUT RECIPIENT NEPHRECTOMY;  Surgeon: Barkley Coni Stallion, MD;  Location: MAIN OR [place];  Service: Transplant   [4]   Family History  Problem Relation Age of Onset    Hypertension Mother     Kidney disease Mother     Diabetes Mother     Hypertension Father     Kidney disease Sister         s/p transplant    Hypertension Brother     Glaucoma Neg Hx     Amblyopia Neg Hx     Blindness Neg Hx     Retinal detachment Neg Hx     Strabismus Neg Hx     Macular degeneration Neg Hx     Basal cell carcinoma Neg Hx     Melanoma Neg Hx     Squamous cell carcinoma Neg Hx    [5]   Social History  Tobacco Use    Smoking status: Former     Current packs/day: 0.00     Average packs/day: 1 pack/day for 30.0 years (30.0 ttl pk-yrs)     Types: Cigarettes     Start date: 07/11/1979     Quit date: 07/10/2009     Years since quitting: 15.0     Passive exposure: Never    Smokeless tobacco: Never   Vaping Use    Vaping status: Never Used   Substance Use Topics    Alcohol use: No     Alcohol/week: 0.0 standard drinks of alcohol    Drug use: No        Georjean Falling [place], [place]  08/09/24 1630

## 2024-08-15 DIAGNOSIS — K2900 Acute gastritis without bleeding: Principal | ICD-10-CM

## 2024-08-15 DIAGNOSIS — M25572 Pain in left ankle and joints of left foot: Principal | ICD-10-CM

## 2024-08-15 MED ORDER — SUCRALFATE 100 MG/ML ORAL SUSPENSION
Freq: Four times a day (QID) | ORAL | 0 refills | 7.00000 days | Status: CP
Start: 2024-08-15 — End: 2024-08-22

## 2024-08-15 MED ORDER — SUCRALFATE 1 GRAM TABLET
0 refills | 0.00000 days
Start: 2024-08-15 — End: ?

## 2024-08-15 NOTE — Unmapped (Addendum)
 For lower abdominal pain, try Gas-X (simethicone) as needed. This is over the counter (no prescription needed).

## 2024-08-15 NOTE — Unmapped (Signed)
 Assessment and Plan:     Assessment & Plan  Abdominal pain with cramping and gastritis  Chronic abdominal pain with cramping, likely muscular or digestive in origin. Previous imaging and tests normal. Pepcid ineffective.  - Prescribed Carafate  (sucralfate ) liquid for one week.  - Recommended over-the-counter simethicone (Gas-X).  - Advised use of heat pads.  - Instructed to report if no improvement in 7-10 days.    Left ankle and foot pain  Chronic pain exacerbated by activity, suggesting musculoskeletal or structural issues.  - Referred to podiatrist at [facility] for evaluation.             Acute gastritis without hemorrhage, unspecified gastritis type  - sucralfate  (CARAFATE ) 100 mg/mL suspension; Take 10 mL (1 g total) by mouth four (4) times a day for 7 days.    Acute left ankle pain  - Ambulatory referral to Podiatry; Future           At onset of visit I advised patient I am using a new tool to record our discussion today. The recording would write down what we discuss and once reviewed and finalized would become a part of the chart note for today's visit. Patient amenable to recording.     I personally spent 35 minutes face-to-face and non-face-to-face in the care of this patient, which includes all pre, intra, and post visit time on the date of service.    Return for has return appointment scheduled for 10/02/24 for routine follow up.    HPI:      Louis Dennis is here for   Chief Complaint   Patient presents with    Hospitalization Follow-up     Hospitalization follow-up for abd pain.  When he eats (especially fruits) it makes the pain worse.  Heat helps.                History of Present Illness  Louis Dennis is a 63 year old male with a history of kidney transplant who presents with abdominal pain and urinary symptoms.    He experiences abdominal pain located in the right lower and left upper abdomen, exacerbated by eating and prolonged sitting. The pain is reminiscent of discomfort felt during dialysis before his kidney transplant. A heat pad provides some relief, reducing the pain from a level of 2-3 to about 1. No constipation, diarrhea, or burning during urination. He urinates frequently during the day with minimal output at night. He also reports some acid reflux symptoms, particularly when sitting or lying down, which improve when standing or moving.    He was recently evaluated at the ED, where he underwent a CT scan and was told by the doctor that there were no problems with his transplanted kidney. He completed a course of antibiotics for a suspected bacterial infection, but they did not alleviate his symptoms. He has been taking Pepcid for a week for gas, but it has not been effective. He recalls using a pink liquid medication in the past for stomach pain, which he found helpful.    Additionally, he reports foot pain that began two months ago, preventing him from jogging. The pain starts after about five minutes of walking, making it difficult to continue. He has stopped jogging due to the pain. Patient requests referral to see podiatry.      ROS:      Comprehensive 10 point ROS negative unless otherwise stated in the HPI.      [facility] Components:  Medication adherence and barriers to the treatment plan have been addressed. Opportunities to optimize healthy behaviors have been discussed. Patient / caregiver voiced understanding.    Past Medical/Surgical History:     Past Medical History[1]  Past Surgical History[2]    Family History:     Family History[3]    Social History:     Short Social History[4]    Allergies:     Patient has no known allergies.    Current Medications:     Current Medications[5]    Health Maintenance:     Health Maintenance   Topic Date Due    COVID-19 Vaccine (7 - Moderna risk 2024-25 season) 08/05/2024    Influenza Vaccine (1) 08/05/2024    Lipid Screening  04/15/2029    DTaP/Tdap/Td Vaccines (2 - Td or Tdap) 12/30/2029    Colon Cancer Screening  10/23/2033    Pneumococcal Vaccine 50+  Completed    Hepatitis C Screen  Completed    Lung Cancer Screening Shared Decision Making  Discontinued    Zoster Vaccines  Discontinued       Immunizations:     Immunization History   Administered Date(s) Administered    COVID-19 VACCINE,MRNA(MODERNA)(PF) 04/12/2020, 05/10/2020, 08/15/2020, 03/17/2021    Covid-19 Vac, (32yr+) (Comirnaty) Mrna Pfizer  11/17/2023    Covid-19 Vac, (67yr+) (Spikevax) Monovalent Moderna 11/11/2022    INFLUENZA INJ MDCK PF, QUAD,(FLUCELVAX )( AND UP EGG FREE) 09/04/2017, 08/31/2020, 08/30/2021    INFLUENZA TIV (TRI) PF (IM)(HISTORICAL) 08/17/2011, 08/08/2012, 08/05/2013, 08/28/2014, 08/11/2015, 08/29/2015    INFLUENZA VACCINE IIV3(IM)(PF)6 MOS UP 10/10/2023    Influenza Vaccine Quad(IM)6 MO-Adult(PF) 08/16/2015, 09/06/2016, 09/11/2017, 08/06/2019, 11/07/2022    Influenza Virus Vaccine, unspecified formulation 08/30/2016, 09/18/2017, 09/04/2018, 10/02/2018    PNEUMOCOCCAL POLYSACCHARIDE 23-VALENT 07/06/2015    PPD Test 09/08/2011, 09/07/2012, 09/05/2013, 09/09/2014, 09/07/2015, 08/10/2016, 09/11/2017, 09/05/2018, 01/03/2019    Pneumococcal Conjugate 13-Valent 07/24/2010, 07/15/2015    Pneumococcal Conjugate 20-valent 08/30/2021    TdaP 12/31/2019     I have reviewed and (if needed) updated the patient's problem list, medications, allergies, past medical and surgical history, social and family history.     Vital Signs:     Wt Readings from Last 3 Encounters:   08/15/24 60.3 kg (133 lb)   08/09/24 61 kg (134 lb 6.4 oz)   05/29/24 63.4 kg (139 lb 11.2 oz)     Temp Readings from Last 3 Encounters:   08/15/24 37 ??C (98.6 ??F) (Oral)   08/09/24 36.7 ??C (98.1 ??F) (Oral)   05/29/24 36.7 ??C (98 ??F) (Temporal)     BP Readings from Last 3 Encounters:   08/15/24 102/66   08/09/24 124/71   05/29/24 116/70     Pulse Readings from Last 3 Encounters:   08/15/24 75   08/09/24 65   05/29/24 76     Estimated body mass index is 23.56 kg/m?? as calculated from the following:    Height as of this encounter: 160 cm (5' 3).    Weight as of this encounter: 60.3 kg (133 lb).  Facility age limit for growth %iles is 20 years.      Objective:       General: Alert and oriented x3. Well-appearing. No acute distress.   HEENT:  Normocephalic.  Atraumatic. Conjunctiva and sclera normal. OP MMM without lesions.   Neck:  Supple. No thyroid enlargement. No adenopathy.   Heart:  Regular rate and rhythm. Normal S1, S2. No murmurs, rubs or gallops.   Lungs:  No respiratory distress.  Lungs  clear to auscultation. No wheezes, rhonchi, or rales.   GI/GU:  Soft, +BS, non-distended, mild epigastric TTP and to LUQ and RLQ. No palpable masses or organomegaly.   Extremities:  No edema. Peripheral pulses normal. Mild TTP over left lateral ankle. No visible swelling, bruising, erythema, or deformity.   Skin:  Warm, dry. No rash or lesions present.   Neuro:  Non-focal. No obvious weakness.   Psych:  Affect normal, eye contact good, speech clear and coherent.           Results  LABS  Creatinine: Within normal limits  BUN: Within normal limits  GFR: Within normal limits  Urinalysis: No bacteria present    RADIOLOGY  Abdominal CT: Unremarkable        Levorn LELON Snide, FNP          [1]   Past Medical History:  Diagnosis Date    Abnormal thyroid function test     End stage renal disease    (CMS-HCC)     now on peritoneal dialysis    Gout     reports was in Right hand, foot and left elbow    Hypertension     takes meds intermitttently based on readings    Nephrolithiasis 2014    Visual impairment     glasses   [2]   Past Surgical History:  Procedure Laterality Date    OTHER SURGICAL HISTORY Right 08/2017    Pt had catheter moved from left side to right side.    pd catheter  2012    PR COLONOSCOPY W/BIOPSY SINGLE/MULTIPLE N/A 06/15/2018    Procedure: COLONOSCOPY, FLEXIBLE, PROXIMAL TO SPLENIC FLEXURE; WITH BIOPSY, SINGLE OR MULTIPLE;  Surgeon: Myrick Izella Keels, MD;  Location: [facility];  Service: Gastroenterology PR COLSC FLX W/RMVL OF TUMOR POLYP LESION SNARE TQ N/A 06/15/2018    Procedure: COLONOSCOPY FLEX; W/REMOV TUMOR/LES BY SNARE;  Surgeon: Myrick Izella Keels, MD;  Location: [facility];  Service: Gastroenterology    PR COLSC FLX W/RMVL OF TUMOR POLYP LESION SNARE TQ N/A 10/24/2023    Procedure: COLONOSCOPY FLEX; W/REMOV TUMOR/LES BY SNARE;  Surgeon: Conny Dorn Agent, MD;  Location: [facility];  Service: Gastroenterology    PR EXPLORATORY OF ABDOMEN N/A 11/22/2018    Procedure: EXPLORATORY LAPAROTOMY, EXPLORATORY CELIOTOMY WITH OR WITHOUT BIOPSY(S);  Surgeon: Creighton Day Juli Bathe, MD;  Location: MAIN OR [facility];  Service: Trauma    PR EXPLORE PARATHYROID  GLANDS N/A 11/11/2019    Procedure: RS 22 PARATHYROIDECTOMY OR EXPLORATION OF PARATHYROID (S);  Surgeon: Jerilynn Debby Cramp, MD;  Location: MAIN OR [facility];  Service: Surgical Oncology    PR FREEING BOWEL ADHESION,ENTEROLYSIS N/A 11/22/2018    Procedure: Enterolysis (Separt Proc);  Surgeon: Creighton Day Juli Bathe, MD;  Location: MAIN OR [facility];  Service: Trauma    PR REMOVE PERITONEAL FOREIGN BODY N/A 11/22/2018    Procedure: Removal Of Peritoneal Of Foreign Body From Peritoneal Cavity;  Surgeon: Creighton Day Juli Bathe, MD;  Location: MAIN OR [facility];  Service: Trauma    PR TRANSPLANT,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    Procedure: [facility] STD PREP CAD DONR RENAL ALLOGFT PRIOR TO TRNSPLNT, INCL DISSEC/REM PERINEPH FAT, DIAPH/RTPER ATTAC;  Surgeon: Barkley Coni Stallion, MD;  Location: MAIN OR [place];  Service: Transplant    PR TRANSPLANTATION OF KIDNEY N/A 05/24/2019    Procedure: RENAL ALLOTRANSPLANTATION, IMPLANTATION OF GRAFT; WITHOUT RECIPIENT NEPHRECTOMY;  Surgeon: Barkley Coni Stallion, MD;  [facility]: [facility];  Service:  Transplant   [3]   Family History  Problem Relation Age of Onset    Hypertension Mother     Kidney disease Mother     Diabetes Mother     Hypertension Father     Kidney disease Sister         s/p transplant    Hypertension Brother     Glaucoma Neg Hx     Amblyopia Neg Hx     Blindness Neg Hx     Retinal detachment Neg Hx     Strabismus Neg Hx     Macular degeneration Neg Hx     Basal cell carcinoma Neg Hx     Melanoma Neg Hx     Squamous cell carcinoma Neg Hx    [4]   Social History  Tobacco Use    Smoking status: Former     Current packs/day: 0.00     Average packs/day: 1 pack/day for 30.0 years (30.0 ttl pk-yrs)     Types: Cigarettes     Start date: 07/11/1979     Quit date: 07/10/2009     Years since quitting: 15.1     Passive exposure: Never    Smokeless tobacco: Never   Vaping Use    Vaping status: Never Used   Substance Use Topics    Alcohol use: No     Alcohol/week: 0.0 standard drinks of alcohol    Drug use: No   [5]   Current Outpatient Medications   Medication Sig Dispense Refill    cephalexin  (KEFTAB) 500 mg tablet Take 1 tablet (500 mg total) by mouth Three (3) times a day for 7 days. 21 tablet 0    chlorhexidine  (PERIDEX ) 0.12 % solution       DENTA 5000 PLUS  1.1 % Crea USE DAY AND NIGHT, BRUSH THOROUGHLY FOR 2 MINS AND SPIT OUT. DO NOT SWALLOW AND RINSE LIGHTLY      lidocaine  (LIDODERM ) 5 % patch Place 1 patch on the skin every twelve (12) hours. Apply to affected area for 12 hours only each day (then remove patch) 10 patch 5    magnesium  oxide-Mg AA chelate (MAGNESIUM , AMINO ACID CHELATE,) 133 mg Take 1 tablet by mouth two (2) times a day. 60 tablet 11    metoPROLOL  tartrate (LOPRESSOR ) 25 MG tablet Take 1/2 tablet (12.5 mg total) by mouth two (2) times a day. 30 tablet 11    MYFORTIC  180 mg EC tablet Take 3 tablets (540 mg total) by mouth Two (2) times a day. 180 tablet 11    tacrolimus  (ENVARSUS  XR) 0.75 mg Tb24 extended release tablet Take 2 tablets (1.5 mg total) by mouth daily. 60 tablet 11    gabapentin  (NEURONTIN ) 100 MG capsule Take 1 capsule (100 mg total) by mouth nightly. (Patient not taking: Reported on 08/15/2024) 90 capsule 3     No current facility-administered medications for this visit.

## 2024-08-15 NOTE — Unmapped (Signed)
 [facility] Refill Coordination Note    Specialty Medication(s) to be Shipped:   Transplant: Envarsus  0.75mg  and Myfortic  180mg     Other medication(s) to be shipped: magnesium  (amino acid chelate) 133 mg tablet (magnesium  oxide-Mg AA chelate), metoPROLOL  tartrate 25 MG tablet (Lopressor )    Specialty Medications not needed at this time: N/A     Louis Dennis, DOB: 1961/03/10  Phone: [phone] (home)       All above HIPAA information was verified with patient.     Was a Nurse, learning disability used for this call? No    Completed refill call assessment today to schedule patient's medication shipment from the [facility]  [phone]).  All relevant notes have been reviewed.     Specialty medication(s) and dose(s) confirmed: Regimen is correct and unchanged.   Changes to medications: [name] reports no changes at this time.  Changes to insurance: No  New side effects reported not previously addressed with a pharmacist or physician: None reported  Questions for the pharmacist: No    Confirmed patient received a Conservation officer, historic buildings and a Surveyor, mining with first shipment. The patient will receive a drug information handout for each medication shipped and additional FDA Medication Guides as required.       DISEASE/MEDICATION-SPECIFIC INFORMATION        N/A    SPECIALTY MEDICATION ADHERENCE     Medication Adherence    Patient reported X missed doses in the last month: 0  Specialty Medication: ENVARSUS  XR 0.75 mg Tb24 extended release tablet (tacrolimus )  Patient is on additional specialty medications: Yes  Additional Specialty Medications: MYFORTIC  180 mg EC tablet (mycophenolate )  Patient Reported Additional Medication X Missed Doses in the Last Month: 0  Patient is on more than two specialty medications: No              Were doses missed due to medication being on hold? No    ENVARSUS  XR 0.75 mg Tb24 extended release tablet (tacrolimus ): 10 days of medicine on hand MYFORTIC  180 mg EC tablet (mycophenolate ): 10 days of medicine on hand       REFERRAL TO PHARMACIST     Referral to the pharmacist: Not needed      [facility]     Shipping address confirmed in Epic.     Cost and Payment: Patient has a $0 copay, payment information is not required.    Delivery Scheduled: Yes, Expected medication delivery date: 08/22/24.     Medication will be delivered via Next Day Courier to the prescription address in Epic WAM.    [facility]   [facility]  Specialty Technician

## 2024-08-16 ENCOUNTER — Ambulatory Visit: Admit: 2024-08-16 | Discharge: 2024-08-17 | Payer: MEDICARE

## 2024-08-16 LAB — MAGNESIUM: MAGNESIUM: 2.1 mg/dL (ref 1.6–2.6)

## 2024-08-16 LAB — PHOSPHORUS: PHOSPHORUS: 2.9 mg/dL (ref 2.4–5.1)

## 2024-08-16 LAB — CBC W/ AUTO DIFF
BASOPHILS ABSOLUTE COUNT: 0 10*9/L (ref 0.0–0.1)
BASOPHILS RELATIVE PERCENT: 0.7 %
EOSINOPHILS ABSOLUTE COUNT: 0.2 10*9/L (ref 0.0–0.5)
EOSINOPHILS RELATIVE PERCENT: 5.4 %
HEMATOCRIT: 43.1 % (ref 39.0–48.0)
HEMOGLOBIN: 14.5 g/dL (ref 12.9–16.5)
LYMPHOCYTES ABSOLUTE COUNT: 0.5 10*9/L — ABNORMAL LOW (ref 1.1–3.6)
LYMPHOCYTES RELATIVE PERCENT: 13.8 %
MEAN CORPUSCULAR HEMOGLOBIN CONC: 33.6 g/dL (ref 32.0–36.0)
MEAN CORPUSCULAR HEMOGLOBIN: 30.3 pg (ref 25.9–32.4)
MEAN CORPUSCULAR VOLUME: 90 fL (ref 77.6–95.7)
MEAN PLATELET VOLUME: 7.5 fL (ref 6.8–10.7)
MONOCYTES ABSOLUTE COUNT: 0.4 10*9/L (ref 0.3–0.8)
MONOCYTES RELATIVE PERCENT: 10.6 %
NEUTROPHILS ABSOLUTE COUNT: 2.6 10*9/L (ref 1.8–7.8)
NEUTROPHILS RELATIVE PERCENT: 69.5 %
NUCLEATED RED BLOOD CELLS: 0 /100{WBCs} (ref <=4)
PLATELET COUNT: 168 10*9/L (ref 150–450)
RED BLOOD CELL COUNT: 4.78 10*12/L (ref 4.26–5.60)
RED CELL DISTRIBUTION WIDTH: 14.8 % (ref 12.2–15.2)
WBC ADJUSTED: 3.8 10*9/L (ref 3.6–11.2)

## 2024-08-16 LAB — BASIC METABOLIC PANEL
ANION GAP: 13 mmol/L (ref 5–14)
BLOOD UREA NITROGEN: 19 mg/dL (ref 9–23)
BUN / CREAT RATIO: 27
CALCIUM: 9.4 mg/dL (ref 8.7–10.4)
CHLORIDE: 104 mmol/L (ref 98–107)
CO2: 25.5 mmol/L (ref 20.0–31.0)
CREATININE: 0.71 mg/dL — ABNORMAL LOW (ref 0.73–1.18)
EGFR CKD-EPI (2021) MALE: >90 mL/min/1.73m2 (ref >=60)
GLUCOSE RANDOM: 146 mg/dL (ref 70–179)
POTASSIUM: 4.8 mmol/L (ref 3.4–4.8)
SODIUM: 142 mmol/L (ref 135–145)

## 2024-08-16 LAB — TACROLIMUS LEVEL, TROUGH: TACROLIMUS, TROUGH: 8.4 ng/mL (ref 5.0–15.0)

## 2024-08-16 MED ORDER — SUCRALFATE 1 GRAM TABLET
ORAL_TABLET | Freq: Four times a day (QID) | ORAL | 0 refills | 7.00000 days | Status: CP
Start: 2024-08-16 — End: ?

## 2024-08-16 NOTE — Unmapped (Signed)
 See pharmacy note

## 2024-08-21 MED FILL — MG-PLUS-PROTEIN 133 MG TABLET: ORAL | 30 days supply | Qty: 60 | Fill #2

## 2024-08-21 MED FILL — MYFORTIC 180 MG TABLET,DELAYED RELEASE: ORAL | 30 days supply | Qty: 180 | Fill #8

## 2024-08-21 MED FILL — METOPROLOL TARTRATE 25 MG TABLET: ORAL | 30 days supply | Qty: 30 | Fill #2

## 2024-08-21 MED FILL — ENVARSUS XR 0.75 MG TABLET,EXTENDED RELEASE: ORAL | 30 days supply | Qty: 60 | Fill #10

## 2024-08-29 DIAGNOSIS — K2900 Acute gastritis without bleeding: Principal | ICD-10-CM

## 2024-08-29 MED ORDER — SUCRALFATE 1 GRAM TABLET
ORAL_TABLET | Freq: Four times a day (QID) | ORAL | 1 refills | 7.00000 days | Status: CP
Start: 2024-08-29 — End: 2024-08-29

## 2024-08-29 NOTE — Unmapped (Signed)
 Patient stopped in requesting a refill on Sucralfate  to [facility].  Please advise patient ASAP.  Thanks

## 2024-08-29 NOTE — Unmapped (Signed)
 Copied from CRM #[id number]. Topic: Access To Clinicians - Medication Question  >> Aug 29, 2024 11:54 AM Rolin R wrote:  They need assistance with their medication(s). Sent to wrong pharmacy    Medication Name(s): sucralfate  (CARAFATE ) 1 gram tablet    Pharmacy: [facility] [address] [place], [place] [address]      Please contact The patient by Cell Phone Telephone Information:  Mobile          [phone]   in regards to this request.    Coverage: yes, coverage is accurate on file.    Urgent turnaround time: within 24 business hours. (Caller Notified)    Urgent Reason: Requiring a resend due to Incorrect Pharmacy

## 2024-08-29 NOTE — Unmapped (Signed)
 Patient is requesting the following refill  Requested Prescriptions     Pending Prescriptions Disp Refills    sucralfate  (CARAFATE ) 1 gram tablet 28 tablet 0     Sig: Take 1 tablet (1 g total) by mouth four (4) times a day.       Recent Visits  Date Type Provider Dept   08/15/24 Office Visit Geralene Levorn Geralds, FNP [facility] [address] [facility]   05/29/24 Office Visit Lora, Alfonso HERO, FNP [facility] [address] [facility]   02/07/24 Office Visit Geralene Levorn Geralds, FNP [facility] [address] [facility]   10/10/23 Office Visit Geralene Levorn Geralds, FNP [facility] [address] [facility]   Showing recent visits within past 365 days and meeting all other requirements  Future Appointments  Date Type Provider Dept   10/02/24 Appointment Geralene Levorn Geralds, FNP [facility] [address] [facility]   Showing future appointments within next 365 days and meeting all other requirements       Labs: Not applicable this refill

## 2024-08-29 NOTE — Unmapped (Signed)
 Addended by: GEORGENE POTTER on: 08/29/2024 12:43 PM     Modules accepted: Orders

## 2024-09-06 ENCOUNTER — Ambulatory Visit: Admit: 2024-09-06 | Discharge: 2024-09-07 | Payer: MEDICARE

## 2024-09-06 LAB — URINALYSIS WITH MICROSCOPY WITH CULTURE REFLEX PERFORMABLE
BACTERIA: NONE SEEN /HPF
BILIRUBIN UA: NEGATIVE
BLOOD UA: NEGATIVE
GLUCOSE UA: NEGATIVE
LEUKOCYTE ESTERASE UA: NEGATIVE
NITRITE UA: NEGATIVE
PH UA: 5.5 (ref 5.0–9.0)
PROTEIN UA: NEGATIVE
RBC UA: <1 /HPF (ref <=3)
SPECIFIC GRAVITY UA: 1.023 (ref 1.003–1.030)
SQUAMOUS EPITHELIAL: <1 /HPF (ref 0–5)
UROBILINOGEN UA: <2
WBC UA: <1 /HPF (ref <=2)

## 2024-09-06 LAB — CBC W/ AUTO DIFF
BASOPHILS ABSOLUTE COUNT: 0 10*9/L (ref 0.0–0.1)
BASOPHILS RELATIVE PERCENT: 0.8 %
EOSINOPHILS ABSOLUTE COUNT: 0.3 10*9/L (ref 0.0–0.5)
EOSINOPHILS RELATIVE PERCENT: 7 %
HEMATOCRIT: 42.1 % (ref 39.0–48.0)
HEMOGLOBIN: 14.2 g/dL (ref 12.9–16.5)
LYMPHOCYTES ABSOLUTE COUNT: 0.6 10*9/L — ABNORMAL LOW (ref 1.1–3.6)
LYMPHOCYTES RELATIVE PERCENT: 14.9 %
MEAN CORPUSCULAR HEMOGLOBIN CONC: 33.8 g/dL (ref 32.0–36.0)
MEAN CORPUSCULAR HEMOGLOBIN: 30.4 pg (ref 25.9–32.4)
MEAN CORPUSCULAR VOLUME: 89.9 fL (ref 77.6–95.7)
MEAN PLATELET VOLUME: 7.6 fL (ref 6.8–10.7)
MONOCYTES ABSOLUTE COUNT: 0.4 10*9/L (ref 0.3–0.8)
MONOCYTES RELATIVE PERCENT: 10.2 %
NEUTROPHILS ABSOLUTE COUNT: 2.5 10*9/L (ref 1.8–7.8)
NEUTROPHILS RELATIVE PERCENT: 67.1 %
NUCLEATED RED BLOOD CELLS: 0 /100{WBCs} (ref <=4)
PLATELET COUNT: 158 10*9/L (ref 150–450)
RED BLOOD CELL COUNT: 4.68 10*12/L (ref 4.26–5.60)
RED CELL DISTRIBUTION WIDTH: 15.2 % (ref 12.2–15.2)
WBC ADJUSTED: 3.7 10*9/L (ref 3.6–11.2)

## 2024-09-06 LAB — BASIC METABOLIC PANEL
ANION GAP: 15 mmol/L — ABNORMAL HIGH (ref 5–14)
BLOOD UREA NITROGEN: 24 mg/dL — ABNORMAL HIGH (ref 9–23)
BUN / CREAT RATIO: 37
CALCIUM: 9.7 mg/dL (ref 8.7–10.4)
CHLORIDE: 103 mmol/L (ref 98–107)
CO2: 23.6 mmol/L (ref 20.0–31.0)
CREATININE: 0.65 mg/dL — ABNORMAL LOW (ref 0.73–1.18)
EGFR CKD-EPI (2021) MALE: >90 mL/min/1.73m2 (ref >=60)
GLUCOSE RANDOM: 127 mg/dL (ref 70–179)
POTASSIUM: 4.5 mmol/L (ref 3.4–4.8)
SODIUM: 142 mmol/L (ref 135–145)

## 2024-09-06 LAB — MAGNESIUM: MAGNESIUM: 1.9 mg/dL (ref 1.6–2.6)

## 2024-09-06 LAB — PHOSPHORUS: PHOSPHORUS: 2.9 mg/dL (ref 2.4–5.1)

## 2024-09-06 LAB — TACROLIMUS LEVEL, TROUGH: TACROLIMUS, TROUGH: 6.1 ng/mL (ref 5.0–15.0)

## 2024-09-12 NOTE — Unmapped (Signed)
 [facility] Refill Coordination Note    Specialty Medication(s) to be Shipped:   Transplant: Envarsus  XR 0.75 mg and Myfortic  180 mg    Other medication(s) to be shipped: metoprolol , magnesium      Specialty Medications not needed at this time: N/A     Louis Dennis, DOB: 11-12-1961  Phone: [phone] (home)       All above HIPAA information was verified with patient.     Was a Nurse, learning disability used for this call? No    Completed refill call assessment today to schedule patient's medication shipment from the [facility]  [phone]).  All relevant notes have been reviewed.     Specialty medication(s) and dose(s) confirmed: Regimen is correct and unchanged.   Changes to medications: [name] reports no changes at this time.  Changes to insurance: No  New side effects reported not previously addressed with a pharmacist or physician: None reported  Questions for the pharmacist: No    Confirmed patient received a Conservation officer, historic buildings and a Surveyor, mining with first shipment. The patient will receive a drug information handout for each medication shipped and additional FDA Medication Guides as required.       DISEASE/MEDICATION-SPECIFIC INFORMATION        N/A    SPECIALTY MEDICATION ADHERENCE     Medication Adherence    Patient reported X missed doses in the last month: 0  Specialty Medication: MYFORTIC  180 mg EC tablet (mycophenolate )  Patient is on additional specialty medications: Yes  Additional Specialty Medications: ENVARSUS  XR 0.75 mg Tb24 extended release tablet (tacrolimus )  Patient Reported Additional Medication X Missed Doses in the Last Month: 0  Patient is on more than two specialty medications: No              Were doses missed due to medication being on hold? No    ENVARSUS  XR 0.75 mg Tb24 extended release tablet (tacrolimus )  12 days of medicine on hand   MYFORTIC  180 mg EC tablet (mycophenolate )  12 days of medicine on hand       REFERRAL TO PHARMACIST     Referral to the pharmacist: Not needed      [facility]     Shipping address confirmed in Epic.     Cost and Payment: Patient has a copay of $7.13. They are aware and have authorized the pharmacy to charge the credit card on file.    Delivery Scheduled: Yes, Expected medication delivery date: 09/17/2024.     Medication will be delivered via Same Day Courier to the prescription address in Epic WAM.    [facility]  Specialty Technician

## 2024-09-16 MED FILL — MG-PLUS-PROTEIN 133 MG TABLET: ORAL | 30 days supply | Qty: 60 | Fill #3

## 2024-09-16 MED FILL — ENVARSUS XR 0.75 MG TABLET,EXTENDED RELEASE: ORAL | 30 days supply | Qty: 60 | Fill #11

## 2024-09-16 MED FILL — METOPROLOL TARTRATE 25 MG TABLET: ORAL | 30 days supply | Qty: 30 | Fill #3

## 2024-09-16 MED FILL — MYFORTIC 180 MG TABLET,DELAYED RELEASE: ORAL | 30 days supply | Qty: 180 | Fill #9

## 2024-10-02 DIAGNOSIS — K2900 Acute gastritis without bleeding: Principal | ICD-10-CM

## 2024-10-02 DIAGNOSIS — Z13820 Encounter for screening for osteoporosis: Principal | ICD-10-CM

## 2024-10-02 DIAGNOSIS — Z09 Encounter for follow-up examination after completed treatment for conditions other than malignant neoplasm: Principal | ICD-10-CM

## 2024-10-02 DIAGNOSIS — R7303 Prediabetes: Principal | ICD-10-CM

## 2024-10-02 DIAGNOSIS — Z125 Encounter for screening for malignant neoplasm of prostate: Principal | ICD-10-CM

## 2024-10-02 MED ORDER — SUCRALFATE 1 GRAM TABLET
ORAL_TABLET | Freq: Four times a day (QID) | ORAL | 2 refills | 7.00000 days | Status: CP
Start: 2024-10-02 — End: 2025-10-01

## 2024-10-02 NOTE — Progress Notes (Signed)
 Assessment and Plan:     Assessment & Plan  Hypertension  Blood pressure controlled at 110/70 mmHg without symptoms.  - Continue metoprolol  tartrate 25 mg oral bid.    Prediabetes  Blood sugar increased from 6.1 to 6.4 over seven months, managed with diet.  - Advise dietary modifications to reduce carbohydrate intake.    Acute gastritis and chronic abdominal pain  Intermittent abdominal pain related to food intake, relieved by sucralfate . Recent weight loss noted.  - Refill sucralfate  for symptomatic relief.  - Advise avoiding foods with MSG and high salt content.    Screening for osteoporosis  Screening indicated due to kidney transplant and risk factors.  - Order bone density test at [facility] clinic.             Renovascular hypertension      Pre-diabetes  - POCT glycosylated hemoglobin (Hb A1C)    Acute gastritis without hemorrhage, unspecified gastritis type  - sucralfate  (CARAFATE ) 1 gram tablet; Take 1 tablet (1 g total) by mouth four (4) times a day.    Screening for osteoporosis  - Dexa Bone Density Skeletal; Future    Screening for prostate cancer  - PSA (Prostate Specific Antigen); Future    Encounter for follow-up examination after completed treatment for conditions other than malignant neoplasm  - Dexa Bone Density Skeletal; Future           At onset of visit I advised patient I am using a new tool to record our discussion today. The recording would write down what we discuss and once reviewed and finalized would become a part of the chart note for today's visit. Patient amenable to recording.     I personally spent 30 minutes face-to-face and non-face-to-face in the care of this patient, which includes all pre, intra, and post visit time on the date of service.    Return in about 4 months (around 02/01/2025) for Next scheduled follow up.    HPI:      Louis Dennis is here for   Chief Complaint   Patient presents with    Hypertension    Prediabetes    End stage renal disease    Flu Vaccine     Agrees. History of Present Illness  Louis Dennis is a 63 year old male with hypertension and diabetes who presents for a regular follow-up visit to check blood sugar and blood pressure.    He needs a refill for his stomach medication, which he uses as needed for stomach pain after eating certain foods, such as restaurant meals. The pain resolves within two days after taking the medication. He suspects that certain seasonings or MSG might be causing the discomfort.    He requests a PSA test, mentioning that he had one in 2023 and would like to repeat it.    He is interested in a bone density test due to concerns about bone health, especially since he has a kidney condition and a friend with a similar condition receives Prolia shots for bone health.    He monitors his blood sugar levels at home but has not checked them in the past month. His blood sugar level has increased slightly from 6.1 in March to 6.4 currently. He manages his diabetes through diet, avoiding carbohydrates and sweet foods, and is not on medication for it.    He takes metoprolol  12.5 mg twice daily for blood pressure management. No chest pain, shortness of breath, blurred vision, or headaches. He reports a  weight loss of four pounds over the past six months, attributed to reduced food intake due to stomach issues. He clarifies that he was not actively trying to lose weight.    No swelling in his feet or ankles, skin problems, itching, or rash.     ROS:      Comprehensive 10 point ROS negative unless otherwise stated in the HPI.      [facility] Components:     Medication adherence and barriers to the treatment plan have been addressed. Opportunities to optimize healthy behaviors have been discussed. Patient / caregiver voiced understanding.    Past Medical/Surgical History:     Past Medical History[1]  Past Surgical History[2]    Family History:     Family History[3]    Social History:     Short Social History[4]    Allergies:     Patient has no known allergies.    Current Medications:     Current Medications[5]    Health Maintenance:     Health Maintenance   Topic Date Due    COVID-19 Vaccine (7 - 2025-26 season) 08/05/2024    Colon Cancer Screening  10/23/2026    Lipid Screening  04/15/2029    DTaP/Tdap/Td Vaccines (2 - Td or Tdap) 12/30/2029    Pneumococcal Vaccine 50+  Completed    Hepatitis C Screen  Completed    Influenza Vaccine  Completed    Lung Cancer Screening Shared Decision Making  Discontinued    Zoster Vaccines  Discontinued       Immunizations:     Immunization History   Administered Date(s) Administered    COVID-19 VACCINE,MRNA(MODERNA)(PF) 04/12/2020, 05/10/2020, 08/15/2020, 03/17/2021    Covid-19 Vac, (25yr+) (Comirnaty) Mrna Pfizer  11/17/2023    Covid-19 Vac, (8yr+) (Spikevax) Monovalent Moderna 11/11/2022    INFLUENZA INJ MDCK PF, QUAD,(FLUCELVAX )( AND UP EGG FREE) 09/04/2017, 08/31/2020, 08/30/2021    INFLUENZA TIV (TRI) PF (IM)(HISTORICAL) 08/17/2011, 08/08/2012, 08/05/2013, 08/28/2014, 08/11/2015, 08/29/2015    INFLUENZA VACCINE IIV3(IM)(PF)6 MOS UP 10/10/2023, 10/02/2024    Influenza Vaccine Quad(IM)6 MO-Adult(PF) 08/16/2015, 09/06/2016, 09/11/2017, 08/06/2019, 11/07/2022    Influenza Virus Vaccine, unspecified formulation 08/30/2016, 09/18/2017, 09/04/2018, 10/02/2018    PNEUMOCOCCAL POLYSACCHARIDE 23-VALENT 07/06/2015    PPD Test 09/08/2011, 09/07/2012, 09/05/2013, 09/09/2014, 09/07/2015, 08/10/2016, 09/11/2017, 09/05/2018, 01/03/2019    Pneumococcal Conjugate 13-Valent 07/24/2010, 07/15/2015    Pneumococcal Conjugate 20-valent 08/30/2021    TdaP 12/31/2019     I have reviewed and (if needed) updated the patient's problem list, medications, allergies, past medical and surgical history, social and family history.     Vital Signs:     Wt Readings from Last 3 Encounters:   10/02/24 59.9 kg (132 lb)   08/15/24 60.3 kg (133 lb)   08/09/24 61 kg (134 lb 6.4 oz)     Temp Readings from Last 3 Encounters:   10/02/24 36.6 ??C (97.8 ??F) (Oral) 08/15/24 37 ??C (98.6 ??F) (Oral)   08/09/24 36.7 ??C (98.1 ??F) (Oral)     BP Readings from Last 3 Encounters:   10/02/24 110/70   08/15/24 102/66   08/09/24 124/71     Pulse Readings from Last 3 Encounters:   10/02/24 70   08/15/24 75   08/09/24 65     Estimated body mass index is 23.38 kg/m?? as calculated from the following:    Height as of this encounter: 160 cm (5' 3).    Weight as of this encounter: 59.9 kg (132 lb).  Facility age limit for growth %iles is 20  years.      Objective:       General: Alert and oriented x3. Well-appearing. No acute distress.   HEENT:  Normocephalic.  Atraumatic. Conjunctiva and sclera normal. OP MMM without lesions.   Neck:  Supple. No thyroid enlargement. No adenopathy.   Heart:  Regular rate and rhythm. Normal S1, S2. No murmurs, rubs or gallops.   Lungs:  No respiratory distress.  Lungs clear to auscultation. No wheezes, rhonchi, or rales.   GI/GU:  Soft, +BS, nondistended, non-TTP. No palpable masses or organomegaly.   Extremities:  No edema. Peripheral pulses normal.   Skin:  Warm, dry. No rash or lesions present.   Neuro:  Non-focal. No obvious weakness.   Psych:  Affect normal, eye contact good, speech clear and coherent.           Results  LABS    HbA1c: 6.4% (10/02/2024)        Levorn LELON Snide, FNP          [1]   Past Medical History:  Diagnosis Date    Abnormal thyroid function test     Arthritis     Diabetes mellitus (CMS-HCC)     End stage renal disease (CMS-HCC)     now on peritoneal dialysis    GERD (gastroesophageal reflux disease)     Gout     reports was in Right hand, foot and left elbow    Hypertension     takes meds intermitttently based on readings    Nephrolithiasis 2014    Osteoporosis     Visual impairment     glasses   [2]   Past Surgical History:  Procedure Laterality Date    OTHER SURGICAL HISTORY Right 08/2017    Pt had catheter moved from left side to right side.    pd catheter  2012    PR COLONOSCOPY W/BIOPSY SINGLE/MULTIPLE N/A 06/15/2018    Procedure: COLONOSCOPY, FLEXIBLE, PROXIMAL TO SPLENIC FLEXURE; WITH BIOPSY, SINGLE OR MULTIPLE;  Surgeon: Myrick Izella Keels, MD;  [facility]: [facility];  Service: Gastroenterology    PR COLSC FLX W/RMVL OF TUMOR POLYP LESION SNARE TQ N/A 06/15/2018    Procedure: COLONOSCOPY FLEX; W/REMOV TUMOR/LES BY SNARE;  Surgeon: Myrick Izella Keels, MD;  [facility]: [facility];  Service: Gastroenterology    PR COLSC FLX W/RMVL OF TUMOR POLYP LESION SNARE TQ N/A 10/24/2023    Procedure: COLONOSCOPY FLEX; W/REMOV TUMOR/LES BY SNARE;  Surgeon: Conny Dorn Agent, MD;  Location: [facility];  Service: Gastroenterology    PR EXPLORATORY OF ABDOMEN N/A 11/22/2018    Procedure: EXPLORATORY LAPAROTOMY, EXPLORATORY CELIOTOMY WITH OR WITHOUT BIOPSY(S);  Surgeon: Creighton Day Juli Bathe, MD;  Location: MAIN OR [facility];  Service: Trauma    PR EXPLORE PARATHYROID  GLANDS N/A 11/11/2019    Procedure: RS 22 PARATHYROIDECTOMY OR EXPLORATION OF PARATHYROID (S);  Surgeon: Jerilynn Debby Cramp, MD;  Location: MAIN OR [facility];  Service: Surgical Oncology    PR FREEING BOWEL ADHESION,ENTEROLYSIS N/A 11/22/2018    Procedure: Enterolysis (Separt Proc);  Surgeon: Creighton Day Juli Bathe, MD;  Location: MAIN OR [facility];  Service: Trauma    PR REMOVE PERITONEAL FOREIGN BODY N/A 11/22/2018    Procedure: Removal Of Peritoneal Of Foreign Body From Peritoneal Cavity;  Surgeon: Creighton Day Juli Bathe, MD;  Location: MAIN OR [facility];  Service: Trauma    PR TRANSPLANT,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    Procedure: [facility] STD PREP CAD DONR RENAL ALLOGFT PRIOR TO  TRNSPLNT, INCL DISSEC/REM PERINEPH FAT, DIAPH/RTPER ATTAC;  Surgeon: Barkley Coni Stallion, MD;  Location: MAIN OR [facility];  Service: Transplant    PR TRANSPLANTATION OF KIDNEY N/A 05/24/2019    Procedure: RENAL ALLOTRANSPLANTATION, IMPLANTATION OF GRAFT; WITHOUT RECIPIENT NEPHRECTOMY;  Surgeon: Barkley Coni Stallion, MD;  Location: MAIN OR ;  Service: Transplant   [3]   Family History  Problem Relation Age of Onset    Hypertension Mother     Kidney disease Mother     Diabetes Mother     Hypertension Father     Kidney disease Sister         s/p transplant    Hypertension Brother     Glaucoma Neg Hx     Amblyopia Neg Hx     Blindness Neg Hx     Retinal detachment Neg Hx     Strabismus Neg Hx     Macular degeneration Neg Hx     Basal cell carcinoma Neg Hx     Melanoma Neg Hx     Squamous cell carcinoma Neg Hx    [4]   Social History  Tobacco Use    Smoking status: Former     Current packs/day: 0.00     Average packs/day: 1 pack/day for 30.0 years (30.0 ttl pk-yrs)     Types: Cigarettes     Start date: 07/11/1979     Quit date: 07/10/2009     Years since quitting: 15.2     Passive exposure: Never    Smokeless tobacco: Never   Vaping Use    Vaping status: Never Used   Substance Use Topics    Alcohol use: Never    Drug use: Never   [5]   Current Outpatient Medications   Medication Sig Dispense Refill    chlorhexidine  (PERIDEX ) 0.12 % solution       DENTA 5000 PLUS  1.1 % Crea USE DAY AND NIGHT, BRUSH THOROUGHLY FOR 2 MINS AND SPIT OUT. DO NOT SWALLOW AND RINSE LIGHTLY      gabapentin  (NEURONTIN ) 100 MG capsule Take 1 capsule (100 mg total) by mouth nightly. 90 capsule 3    lidocaine  (LIDODERM ) 5 % patch Place 1 patch on the skin every twelve (12) hours. Apply to affected area for 12 hours only each day (then remove patch) 10 patch 5    magnesium  oxide-Mg AA chelate (MAGNESIUM , AMINO ACID CHELATE,) 133 mg Take 1 tablet by mouth two (2) times a day. 60 tablet 11    metoPROLOL  tartrate (LOPRESSOR ) 25 MG tablet Take 1/2 tablet (12.5 mg total) by mouth two (2) times a day. 30 tablet 11    MYFORTIC  180 mg EC tablet Take 3 tablets (540 mg total) by mouth Two (2) times a day. 180 tablet 11    tacrolimus  (ENVARSUS  XR) 0.75 mg Tb24 extended release tablet Take 2 tablets (1.5 mg total) by mouth daily. 60 tablet 11    sucralfate  (CARAFATE ) 1 gram tablet Take 1 tablet (1 g total) by mouth four (4) times a day. 28 tablet 2     No current facility-administered medications for this visit.

## 2024-10-09 DIAGNOSIS — Z940 Kidney transplant status: Principal | ICD-10-CM

## 2024-10-09 MED ORDER — TACROLIMUS XR 0.75 MG TABLET,EXTENDED RELEASE 24 HR
ORAL_TABLET | Freq: Every day | ORAL | 11 refills | 30.00000 days | Status: CP
Start: 2024-10-09 — End: 2025-10-09
  Filled 2024-10-15: qty 60, 30d supply, fill #0

## 2024-10-09 NOTE — Progress Notes (Signed)
 [facility] Delivery Pharm[facility] Refill Coordination Note    Specialty Medication(s) to be Shipped:   Transplant: Envarsus  XR 0.75 mg and Myfortic  180 mg    Other medication(s) to be shipped: magnesium , metoprolol     Specialty Medications not needed at this time: N/A     Louis Dennis, DOB: [birth year]  Phone: [phone] (home)       All above HIPAA information was verified with patient.     Was a nurse, learning disability used for this call? No    Completed refill call assessment today to schedule patient's medication shipment from the [facility]  [phone]).  All relevant notes have been reviewed.     Specialty medication(s) and dose(s) confirmed: Regimen is correct and unchanged.   Changes to medications: Louis Dennis reports no changes at this time.  Changes to insurance: No  New side effects reported not previously addressed with a pharmacist or physician: None reported  Questions for the pharmacist: No    Confirmed patient received a Conservation Officer, Historic Buildings and a Surveyor, Mining with first shipment. The patient will receive a drug information handout for each medication shipped and additional FDA Medication Guides as required.       DISEASE/MEDICATION-SPECIFIC INFORMATION        N/A    SPECIALTY MEDICATION ADHERENCE     Medication Adherence    Patient reported X missed doses in the last month: 0  Specialty Medication: MYFORTIC  180 mg EC tablet (mycophenolate )  Patient is on additional specialty medications: Yes  Additional Specialty Medications: ENVARSUS  XR 0.75 mg Tb24 extended release tablet (tacrolimus )  Patient Reported Additional Medication X Missed Doses in the Last Month: 0  Patient is on more than two specialty medications: No              Were doses missed due to medication being on hold? No    ENVARSUS  XR 0.75 mg Tb24 extended release tablet (tacrolimus ) 10 days of medicine on hand   MYFORTIC  180 mg EC tablet (mycophenolate )  9 days of medicine on hand       REFERRAL TO PHARMACIST     Referral to the pharmacist: Not needed      [facility]     Shipping address confirmed in Epic.     Cost and Payment: Patient has a copay of $7.13. They are aware and have authorized the pharmacy to charge the credit card on file.    Delivery Scheduled: Yes, Expected medication delivery date: 10/15/2024.  However, Rx request for refills was sent to the provider as there are none remaining.     Medication will be delivered via Same Day Courier to the prescription address in Epic WAM.    [facility]  Specialty Technician

## 2024-10-10 ENCOUNTER — Ambulatory Visit: Admit: 2024-10-10 | Discharge: 2024-10-11 | Payer: MEDICARE

## 2024-10-10 DIAGNOSIS — Z940 Kidney transplant status: Principal | ICD-10-CM

## 2024-10-10 LAB — URINALYSIS WITH MICROSCOPY WITH CULTURE REFLEX PERFORMABLE
BACTERIA: NONE SEEN /HPF
BILIRUBIN UA: NEGATIVE
BLOOD UA: NEGATIVE
GLUCOSE UA: NEGATIVE
KETONES UA: NEGATIVE
LEUKOCYTE ESTERASE UA: NEGATIVE
NITRITE UA: NEGATIVE
PH UA: 5 (ref 5.0–9.0)
PROTEIN UA: NEGATIVE
RBC UA: <1 /HPF (ref <=3)
SPECIFIC GRAVITY UA: 1.022 (ref 1.003–1.030)
SQUAMOUS EPITHELIAL: <1 /HPF (ref 0–5)
UROBILINOGEN UA: <2
WBC UA: 1 /HPF (ref <=2)

## 2024-10-10 LAB — MAGNESIUM: MAGNESIUM: 2 mg/dL (ref 1.6–2.6)

## 2024-10-10 LAB — HEPATIC FUNCTION PANEL
ALBUMIN: 4.1 g/dL (ref 3.4–5.0)
ALKALINE PHOSPHATASE: 73 U/L (ref 46–116)
ALT (SGPT): 20 U/L (ref 10–49)
AST (SGOT): 15 U/L (ref <=34)
BILIRUBIN DIRECT: 0.3 mg/dL (ref 0.00–0.30)
BILIRUBIN TOTAL: 1.1 mg/dL (ref 0.3–1.2)
PROTEIN TOTAL: 7.3 g/dL (ref 5.7–8.2)

## 2024-10-10 LAB — BASIC METABOLIC PANEL
ANION GAP: 15 mmol/L — ABNORMAL HIGH (ref 5–14)
BLOOD UREA NITROGEN: 26 mg/dL — ABNORMAL HIGH (ref 9–23)
BUN / CREAT RATIO: 43
CALCIUM: 9.6 mg/dL (ref 8.7–10.4)
CHLORIDE: 102 mmol/L (ref 98–107)
CO2: 25.4 mmol/L (ref 20.0–31.0)
CREATININE: 0.61 mg/dL — ABNORMAL LOW (ref 0.73–1.18)
EGFR CKD-EPI (2021) MALE: >90 mL/min/1.73m2 (ref >=60)
GLUCOSE RANDOM: 132 mg/dL (ref 70–179)
POTASSIUM: 4.2 mmol/L (ref 3.5–5.1)
SODIUM: 142 mmol/L (ref 135–145)

## 2024-10-10 LAB — PHOSPHORUS: PHOSPHORUS: 2.9 mg/dL (ref 2.4–5.1)

## 2024-10-10 LAB — PSA: PROSTATE SPECIFIC ANTIGEN: 0.4 ng/mL (ref 0.00–4.00)

## 2024-10-15 MED FILL — MYFORTIC 180 MG TABLET,DELAYED RELEASE: ORAL | 30 days supply | Qty: 180 | Fill #10

## 2024-10-15 MED FILL — METOPROLOL TARTRATE 25 MG TABLET: ORAL | 30 days supply | Qty: 30 | Fill #4

## 2024-10-15 MED FILL — MG-PLUS-PROTEIN 133 MG TABLET: ORAL | 30 days supply | Qty: 60 | Fill #4

## 2024-10-23 NOTE — Progress Notes (Signed)
 [facility] Delivery Pharm[facility] Clinical Assessment & Refill Coordination Note    Louis Dennis, DOB: 09-19-1961  Phone: [phone] (home)     All above HIPAA information was verified with patient.     Was a nurse, learning disability used for this call? No    Specialty Medication(s):   Transplant: Envarsus  0.75mg  and Myfortic  180mg      Current Medications[1]     Changes to medications: Louis Dennis reports no changes at this time.    Medication list has been reviewed and updated in Epic: Yes  No high priority alerts noted on med rec    Allergies[2]    Changes to allergies: No    Allergies have been reviewed and updated in Epic: Yes    SPECIALTY MEDICATION ADHERENCE     Envarsus  0.75mg   : 25 days of medicine on hand   Myfortic  180mg   : 25 days of medicine on hand     Medication Adherence    Patient reported X missed doses in the last month: 0  Specialty Medication: envarsus  0.75mg   Patient is on additional specialty medications: Yes  Additional Specialty Medications: Myfortic  180mg   Patient Reported Additional Medication X Missed Doses in the Last Month: 0  Patient is on more than two specialty medications: No          Specialty medication(s) dose(s) confirmed: Regimen is correct and unchanged.     Are there any concerns with adherence? No    Adherence counseling provided? Not needed    CLINICAL MANAGEMENT AND INTERVENTION      Clinical Benefit Assessment:    Do you feel the medicine is effective or helping your condition? Yes    Clinical Benefit counseling provided? Not needed    Adverse Effects Assessment:    Are you experiencing any side effects? No    Are you experiencing difficulty administering your medicine? No    Quality of Life Assessment:    Quality of Life    Rheumatology  Oncology  Dermatology  Cystic Fibrosis          How many days over the past month did your transplant  keep you from your normal activities? For example, brushing your teeth or getting up in the morning. 0    Have you discussed this with your provider? Not needed    Acute Infection Status:    Acute infections noted within Epic:  No active infections    Patient reported infection: None    Therapy Appropriateness:    Is the medication and dose appropriate considering the patient???s diagnosis, treatment, and disease journey, comorbidities, medical history, current medications, allergies, therapeutic goals, self-administration ability, and access barriers? Yes, therapy is appropriate and should be continued     Clinical Intervention:    Was an intervention completed as part of this clinical assessment? No    DISEASE/MEDICATION-SPECIFIC INFORMATION      N/A    Solid Organ Transplant: Not Applicable    PATIENT SPECIFIC NEEDS     Does the patient have any physical, cognitive, or cultural barriers? No    Is the patient high risk? Yes, patient is taking a REMS drug. Medication is dispensed in compliance with REMS program    Does the patient require physician intervention or other additional services (i.e., nutrition, smoking cessation, social work)? No    Does the patient have an additional or emergency contact listed in their chart? Yes    SOCIAL DETERMINANTS OF HEALTH     At the [facility], we  have learned that life circumstances - like trouble affording food, housing, utilities, or transportation can affect the health of many of our patients.   That is why we wanted to ask: are you currently experiencing any life circumstances that are negatively impacting your health and/or quality of life? Patient declined to answer    Social Drivers of Health     Food Insecurity: No Food Insecurity (09/30/2024)    Hunger Vital Sign     Worried About Running Out of Food in the Last Year: Never true     Ran Out of Food in the Last Year: Never true   Tobacco Use: Medium Risk (10/10/2024)    Patient History     Smoking Tobacco Use: Former     Smokeless Tobacco Use: Never     Passive Exposure: Never   Transportation Needs: No Transportation Needs (09/30/2024)    PRAPARE - Therapist, Art (Medical): No     Lack of Transportation (Non-Medical): No   Alcohol Use: Not At Risk (02/07/2024)    Alcohol Use     How often do you have a drink containing alcohol?: Never     How many drinks containing alcohol do you have on a typical day when you are drinking?: Not on file     How often do you have 5 or more drinks on one occasion?: Never   Housing: Low Risk (09/30/2024)    Housing     Within the past 12 months, have you ever stayed: outside, in a car, in a tent, in an overnight shelter, or temporarily in someone else's home (i.e. couch-surfing)?: No     Are you worried about losing your housing?: No   Physical Activity: Not on file   Utilities: Low Risk (09/30/2024)    Utilities     Within the past 12 months, have you been unable to get utilities (heat, electricity) when it was really needed?: No   Stress: Not on file   Interpersonal Safety: Not At Risk (05/17/2024)    Interpersonal Safety     Unsafe Where You Currently Live: No     Physically Hurt by Anyone: No     Abused by Anyone: No   Substance Use: Low Risk (02/07/2024)    Substance Use     In the past year, how often have you used prescription drugs for non-medical reasons?: Never     In the past year, how often have you used illegal drugs?: Never     In the past year, have you used any substance for non-medical reasons?: No   Intimate Partner Violence: Not on file   Social Connections: Not on file   Financial Resource Strain: Low Risk (09/30/2024)    Overall Financial Resource Strain (CARDIA)     Difficulty of Paying Living Expenses: Not hard at all   Health Literacy: Not on file   Internet Connectivity: Internet connectivity concern unknown (09/30/2024)    Internet Connectivity     Do you have access to internet services: Yes     How do you connect to the internet: Not on file     Is your internet connection strong enough for you to watch video on your device without major problems?: Not on file     Do you have enough data to get through the month?: Not on file     Does at least one of the devices have a camera that you can use for video chat?: Not  on file       Would you be willing to receive help with any of the needs that you have identified today? Not applicable       SHIPPING     Specialty Medication(s) to be Shipped:   N/a    Other medication(s) to be shipped: No additional medications requested for fill at this time    Specialty Medications not needed at this time: Transplant: Envarsus  0.75mg  and Myfortic  180mg      Changes to insurance: No    Cost and Payment: n/a    Delivery Scheduled: Patient declined refill at this time due to supply on hand, patient aware next call set up for 2 weeks out but to contact [facility] with any needs sooner.     Medication will be delivered via n/a to the confirmed n/a address in [facility].    The patient will receive a drug information handout for each medication shipped and additional FDA Medication Guides as required.  Verified that patient has previously received a Conservation Officer, Historic Buildings and a Surveyor, Mining.    The patient or caregiver noted above participated in the development of this care plan and knows that they can request review of or adjustments to the care plan at any time.      All of the patient's questions and concerns have been addressed.    Louis Dennis, PharmD   [facility] Specialty and Home Delivery Pharmacy Specialty Pharmacist       [1]   Current Outpatient Medications   Medication Sig Dispense Refill    chlorhexidine  (PERIDEX ) 0.12 % solution       DENTA 5000 PLUS  1.1 % Crea USE DAY AND NIGHT, BRUSH THOROUGHLY FOR 2 MINS AND SPIT OUT. DO NOT SWALLOW AND RINSE LIGHTLY      gabapentin  (NEURONTIN ) 100 MG capsule Take 1 capsule (100 mg total) by mouth nightly. 90 capsule 3    magnesium  oxide-Mg AA chelate (MAGNESIUM , AMINO ACID CHELATE,) 133 mg Take 1 tablet by mouth two (2) times a day. 60 tablet 11    metoPROLOL  tartrate (LOPRESSOR ) 25 MG tablet Take 1/2 tablet (12.5 mg total) by mouth two (2) times a day. 30 tablet 11    MYFORTIC  180 mg EC tablet Take 3 tablets (540 mg total) by mouth Two (2) times a day. 180 tablet 11    sucralfate  (CARAFATE ) 1 gram tablet Take 1 tablet (1 g total) by mouth four (4) times a day. 28 tablet 2    tacrolimus  (ENVARSUS  XR) 0.75 mg Tb24 extended release tablet Take 2 tablets (1.5 mg total) by mouth daily. 60 tablet 11     No current facility-administered medications for this visit.   [2] No Known Allergies

## 2024-11-05 NOTE — Progress Notes (Signed)
 [facility] Refill Coordination Note    Specialty Medication(s) to be Shipped:   Transplant: Envarsus  XR .075 mg mg and Myfortic  180 mg    Other medication(s) to be shipped: magnesium , metoprolol     Specialty Medications not needed at this time: N/A     Deleta Lawyer, DOB: July 03, 1961  Phone: [phone] (home)       All above HIPAA information was verified with patient.     Was a nurse, learning disability used for this call? No    Completed refill call assessment today to schedule patient's medication shipment from the [facility]  [phone]).  All relevant notes have been reviewed.     Specialty medication(s) and dose(s) confirmed: Regimen is correct and unchanged.   Changes to medications: Jeanne reports no changes at this time.  Changes to insurance: No  New side effects reported not previously addressed with a pharmacist or physician: None reported  Questions for the pharmacist: No    Confirmed patient received a Conservation Officer, Historic Buildings and a Surveyor, Mining with first shipment. The patient will receive a drug information handout for each medication shipped and additional FDA Medication Guides as required.       DISEASE/MEDICATION-SPECIFIC INFORMATION        N/A    SPECIALTY MEDICATION ADHERENCE     Medication Adherence    Patient reported X missed doses in the last month: 0  Specialty Medication: MYFORTIC  180 mg EC tablet (mycophenolate )  Patient is on additional specialty medications: Yes  Additional Specialty Medications: ENVARSUS  XR 0.75 mg Tb24 extended release tablet (tacrolimus )  Patient Reported Additional Medication X Missed Doses in the Last Month: 0  Patient is on more than two specialty medications: No              Were doses missed due to medication being on hold? No    ENVARSUS  XR 0.75 mg Tb24 extended release tablet (tacrolimus ) 10 days of medicine on hand   MYFORTIC  180 mg EC tablet (mycophenolate )  10 days of medicine on hand       REFERRAL TO PHARMACIST     Referral to the pharmacist: Not needed      [facility]     Shipping address confirmed in Epic.     Cost and Payment: Patient has a copay of $7.13. They are aware and have authorized the pharmacy to charge the credit card on file.    Delivery Scheduled: Yes, Expected medication delivery date: 11/08/2024.     Medication will be delivered via Next Day Courier to the prescription address in Epic WAM.    [name]  Specialty Technician

## 2024-11-06 ENCOUNTER — Ambulatory Visit: Admit: 2024-11-06 | Discharge: 2024-11-07 | Payer: MEDICARE

## 2024-11-06 LAB — CBC W/ AUTO DIFF
BASOPHILS ABSOLUTE COUNT: 0 10*9/L (ref 0.0–0.1)
BASOPHILS RELATIVE PERCENT: 0.7 %
EOSINOPHILS ABSOLUTE COUNT: 0.3 10*9/L (ref 0.0–0.5)
EOSINOPHILS RELATIVE PERCENT: 6.9 %
HEMATOCRIT: 42.8 % (ref 39.0–48.0)
HEMOGLOBIN: 14.3 g/dL (ref 12.9–16.5)
LYMPHOCYTES ABSOLUTE COUNT: 0.6 10*9/L — ABNORMAL LOW (ref 1.1–3.6)
LYMPHOCYTES RELATIVE PERCENT: 12.6 %
MEAN CORPUSCULAR HEMOGLOBIN CONC: 33.3 g/dL (ref 32.0–36.0)
MEAN CORPUSCULAR HEMOGLOBIN: 30.1 pg (ref 25.9–32.4)
MEAN CORPUSCULAR VOLUME: 90.6 fL (ref 77.6–95.7)
MEAN PLATELET VOLUME: 8.4 fL (ref 6.8–10.7)
MONOCYTES ABSOLUTE COUNT: 0.5 10*9/L (ref 0.3–0.8)
MONOCYTES RELATIVE PERCENT: 10.2 %
NEUTROPHILS ABSOLUTE COUNT: 3.1 10*9/L (ref 1.8–7.8)
NEUTROPHILS RELATIVE PERCENT: 69.6 %
NUCLEATED RED BLOOD CELLS: 0 /100{WBCs} (ref <=4)
PLATELET COUNT: 164 10*9/L (ref 150–450)
RED BLOOD CELL COUNT: 4.73 10*12/L (ref 4.26–5.60)
RED CELL DISTRIBUTION WIDTH: 15 % (ref 12.2–15.2)
WBC ADJUSTED: 4.4 10*9/L (ref 3.6–11.2)

## 2024-11-06 LAB — URINALYSIS WITH MICROSCOPY WITH CULTURE REFLEX PERFORMABLE
BACTERIA: NONE SEEN /HPF
BILIRUBIN UA: NEGATIVE
BLOOD UA: NEGATIVE
GLUCOSE UA: NEGATIVE
KETONES UA: NEGATIVE
LEUKOCYTE ESTERASE UA: NEGATIVE
NITRITE UA: NEGATIVE
PH UA: 5 (ref 5.0–9.0)
PROTEIN UA: NEGATIVE
RBC UA: <1 /HPF (ref <=3)
SPECIFIC GRAVITY UA: 1.017 (ref 1.003–1.030)
SQUAMOUS EPITHELIAL: <1 /HPF (ref 0–5)
UROBILINOGEN UA: <2
WBC UA: <1 /HPF (ref <=2)

## 2024-11-06 LAB — MAGNESIUM: MAGNESIUM: 2 mg/dL (ref 1.6–2.6)

## 2024-11-06 LAB — BASIC METABOLIC PANEL
ANION GAP: 13 mmol/L (ref 5–14)
BLOOD UREA NITROGEN: 18 mg/dL (ref 9–23)
BUN / CREAT RATIO: 30
CALCIUM: 9.6 mg/dL (ref 8.7–10.4)
CHLORIDE: 104 mmol/L (ref 98–107)
CO2: 24.6 mmol/L (ref 20.0–31.0)
CREATININE: 0.6 mg/dL — ABNORMAL LOW (ref 0.73–1.18)
EGFR CKD-EPI (2021) MALE: >90 mL/min/1.73m2 (ref >=60)
GLUCOSE RANDOM: 136 mg/dL (ref 70–179)
POTASSIUM: 4.3 mmol/L (ref 3.4–4.8)
SODIUM: 142 mmol/L (ref 135–145)

## 2024-11-06 LAB — PHOSPHORUS: PHOSPHORUS: 3 mg/dL (ref 2.4–5.1)

## 2024-11-06 LAB — TACROLIMUS LEVEL, TROUGH: TACROLIMUS, TROUGH: 4.6 ng/mL — ABNORMAL LOW (ref 5.0–15.0)

## 2024-11-07 MED FILL — ENVARSUS XR 0.75 MG TABLET,EXTENDED RELEASE: ORAL | 30 days supply | Qty: 60 | Fill #1

## 2024-11-07 MED FILL — MG-PLUS-PROTEIN 133 MG TABLET: ORAL | 30 days supply | Qty: 60 | Fill #5

## 2024-11-07 MED FILL — MYFORTIC 180 MG TABLET,DELAYED RELEASE: ORAL | 30 days supply | Qty: 180 | Fill #11

## 2024-11-07 MED FILL — METOPROLOL TARTRATE 25 MG TABLET: ORAL | 30 days supply | Qty: 30 | Fill #5

## 2024-12-03 DIAGNOSIS — Z940 Kidney transplant status: Principal | ICD-10-CM

## 2024-12-03 MED ORDER — MYFORTIC 180 MG TABLET,DELAYED RELEASE
ORAL_TABLET | Freq: Two times a day (BID) | ORAL | 11 refills | 30.00000 days | Status: CP
Start: 2024-12-03 — End: 2025-12-03
  Filled 2024-12-11: qty 540, 90d supply, fill #0

## 2024-12-03 NOTE — Progress Notes (Signed)
 [facility] Refill Coordination Note    Specialty Medication(s) to be Shipped:   Transplant: Envarsus  xr 0.75 mg and Myfortic  180 mg    Other medication(s) to be shipped: magnesium , metoprolol      Specialty Medications not needed at this time: N/A     Louis Dennis, DOB: 09/25/1961  Phone: [phone] (home)       All above HIPAA information was verified with patient.     Was a nurse, learning disability used for this call? No    Completed refill call assessment today to schedule patient's medication shipment from the [facility]  [phone]).  All relevant notes have been reviewed.     Specialty medication(s) and dose(s) confirmed: Regimen is correct and unchanged.   Changes to medications: Nature reports no changes at this time.  Changes to insurance: No  New side effects reported not previously addressed with a pharmacist or physician: None reported  Questions for the pharmacist: No    Confirmed patient received a Conservation Officer, Historic Buildings and a Surveyor, Mining with first shipment. The patient will receive a drug information handout for each medication shipped and additional FDA Medication Guides as required.       DISEASE/MEDICATION-SPECIFIC INFORMATION        N/A    SPECIALTY MEDICATION ADHERENCE     Medication Adherence    Patient reported X missed doses in the last month: 0  Specialty Medication: tacrolimus : ENVARSUS  XR 0.75 mg Tb24 extended release tablet  Patient is on additional specialty medications: Yes  Additional Specialty Medications: mycophenolate : MYFORTIC  180 mg EC tablet  Patient Reported Additional Medication X Missed Doses in the Last Month: 0  Patient is on more than two specialty medications: No              Were doses missed due to medication being on hold? No    mycophenolate : MYFORTIC  180 mg EC tablet : 11 days of medicine on hand   tacrolimus : ENVARSUS  XR 0.75 mg Tb24 extended release tablet 11 days of medicine on hand       Specialty medication is an injection or given on a cycle: No    REFERRAL TO PHARMACIST     Referral to the pharmacist: Not needed      [facility]     Shipping address confirmed in Epic.     Cost and Payment: Patient has a copay of $7.13. They are aware and have authorized the pharmacy to charge the credit card on file.    Delivery Scheduled: Yes, Expected medication delivery date: 12/11/2024.  However, Rx request for refills was sent to the provider as there are none remaining.     Medication will be delivered via Next Day Courier to the prescription address in Epic WAM.    [facility]  Specialty Technician

## 2024-12-11 MED FILL — METOPROLOL TARTRATE 25 MG TABLET: ORAL | 90 days supply | Qty: 90 | Fill #6

## 2024-12-11 MED FILL — MG-PLUS-PROTEIN 133 MG TABLET: ORAL | 30 days supply | Qty: 60 | Fill #6

## 2024-12-11 MED FILL — ENVARSUS XR 0.75 MG TABLET,EXTENDED RELEASE: ORAL | 90 days supply | Qty: 180 | Fill #2

## 2024-12-11 NOTE — Progress Notes (Signed)
 Deleta Rea 's Envarsus  and Myfortic   shipment will be rescheduled as a result of credit card on file and copay collected.     I have reached out to the patient  at [phone] and communicated the delay. We will reschedule the medication for the delivery date that the patient agreed upon.  We have confirmed the delivery date as 12/12/2024, via next day courier.

## 2024-12-17 ENCOUNTER — Ambulatory Visit: Admit: 2024-12-17 | Discharge: 2024-12-18 | Payer: BLUE CROSS/BLUE SHIELD

## 2024-12-17 LAB — CBC W/ AUTO DIFF
BASOPHILS ABSOLUTE COUNT: 0 10*9/L (ref 0.0–0.1)
BASOPHILS RELATIVE PERCENT: 0.8 %
EOSINOPHILS ABSOLUTE COUNT: 0.2 10*9/L (ref 0.0–0.5)
EOSINOPHILS RELATIVE PERCENT: 5.3 %
HEMATOCRIT: 43 % (ref 39.0–48.0)
HEMOGLOBIN: 14.3 g/dL (ref 12.9–16.5)
LYMPHOCYTES ABSOLUTE COUNT: 0.5 10*9/L — ABNORMAL LOW (ref 1.1–3.6)
LYMPHOCYTES RELATIVE PERCENT: 14 %
MEAN CORPUSCULAR HEMOGLOBIN CONC: 33.3 g/dL (ref 32.0–36.0)
MEAN CORPUSCULAR HEMOGLOBIN: 30.3 pg (ref 25.9–32.4)
MEAN CORPUSCULAR VOLUME: 91.1 fL (ref 77.6–95.7)
MEAN PLATELET VOLUME: 8.5 fL (ref 6.8–10.7)
MONOCYTES ABSOLUTE COUNT: 0.4 10*9/L (ref 0.3–0.8)
MONOCYTES RELATIVE PERCENT: 10.9 %
NEUTROPHILS ABSOLUTE COUNT: 2.4 10*9/L (ref 1.8–7.8)
NEUTROPHILS RELATIVE PERCENT: 69 %
NUCLEATED RED BLOOD CELLS: 0 /100{WBCs} (ref <=4)
PLATELET COUNT: 155 10*9/L (ref 150–450)
RED BLOOD CELL COUNT: 4.72 10*12/L (ref 4.26–5.60)
RED CELL DISTRIBUTION WIDTH: 14.5 % (ref 12.2–15.2)
WBC ADJUSTED: 3.5 10*9/L — ABNORMAL LOW (ref 3.6–11.2)

## 2024-12-17 LAB — BASIC METABOLIC PANEL
ANION GAP: 14 mmol/L (ref 5–14)
BLOOD UREA NITROGEN: 18 mg/dL (ref 9–23)
BUN / CREAT RATIO: 28
CALCIUM: 9.5 mg/dL (ref 8.7–10.4)
CHLORIDE: 102 mmol/L (ref 98–107)
CO2: 24.4 mmol/L (ref 20.0–31.0)
CREATININE: 0.65 mg/dL — ABNORMAL LOW (ref 0.73–1.18)
EGFR CKD-EPI (2021) MALE: >90 mL/min/1.73m2 (ref >=60)
GLUCOSE RANDOM: 125 mg/dL (ref 70–179)
POTASSIUM: 4.5 mmol/L (ref 3.4–4.8)
SODIUM: 140 mmol/L (ref 135–145)

## 2024-12-17 LAB — URINALYSIS WITH MICROSCOPY WITH CULTURE REFLEX PERFORMABLE
BACTERIA: NONE SEEN /HPF
BILIRUBIN UA: NEGATIVE
BLOOD UA: NEGATIVE
GLUCOSE UA: NEGATIVE
KETONES UA: NEGATIVE
LEUKOCYTE ESTERASE UA: NEGATIVE
NITRITE UA: NEGATIVE
PH UA: 5 (ref 5.0–9.0)
PROTEIN UA: NEGATIVE
RBC UA: 1 /HPF (ref <=3)
SPECIFIC GRAVITY UA: 1.02 (ref 1.003–1.030)
SQUAMOUS EPITHELIAL: <1 /HPF (ref 0–5)
UROBILINOGEN UA: <2
WBC UA: 1 /HPF (ref <=2)

## 2024-12-17 LAB — TACROLIMUS LEVEL, TROUGH: TACROLIMUS, TROUGH: 5.9 ng/mL (ref 5.0–15.0)

## 2024-12-17 LAB — MAGNESIUM: MAGNESIUM: 1.9 mg/dL (ref 1.6–2.6)

## 2024-12-17 LAB — PHOSPHORUS: PHOSPHORUS: 2.8 mg/dL (ref 2.4–5.1)

## 2024-12-18 DIAGNOSIS — R799 Abnormal finding of blood chemistry, unspecified: Principal | ICD-10-CM

## 2024-12-18 DIAGNOSIS — Z940 Kidney transplant status: Principal | ICD-10-CM

## 2024-12-18 DIAGNOSIS — R82998 Other abnormal findings in urine: Principal | ICD-10-CM

## 2024-12-19 ENCOUNTER — Ambulatory Visit: Admit: 2024-12-19 | Discharge: 2024-12-20 | Payer: MEDICARE | Attending: Nephrology | Primary: Nephrology

## 2024-12-19 DIAGNOSIS — R82998 Other abnormal findings in urine: Principal | ICD-10-CM

## 2024-12-19 DIAGNOSIS — Z940 Kidney transplant status: Principal | ICD-10-CM

## 2024-12-19 LAB — URINALYSIS WITH MICROSCOPY
BACTERIA: NONE SEEN /HPF
BILIRUBIN UA: NEGATIVE
BLOOD UA: NEGATIVE
GLUCOSE UA: NEGATIVE
KETONES UA: NEGATIVE
LEUKOCYTE ESTERASE UA: NEGATIVE
NITRITE UA: NEGATIVE
PH UA: 5.5 (ref 5.0–9.0)
PROTEIN UA: NEGATIVE
RBC UA: <1 /HPF (ref <=3)
SPECIFIC GRAVITY UA: 1.019 (ref 1.003–1.030)
SQUAMOUS EPITHELIAL: <1 /HPF (ref 0–5)
UROBILINOGEN UA: <2
WBC UA: <1 /HPF (ref <=2)

## 2024-12-19 LAB — PROTEIN / CREATININE RATIO, URINE
CREATININE, URINE: 60.6 mg/dL
PROTEIN URINE: 7 mg/dL
PROTEIN/CREAT RATIO, URINE: 0.116

## 2024-12-19 NOTE — Progress Notes (Signed)
 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          12/19/24 1402   BP: 99/55   Pulse: 67   Temp: 36.4 ??C (97.5 ??F)   Weight: 63 kg (138 lb 12.8 oz)   Height: 167.6 cm (5' 6)   PainSc: 0-No pain       Assessment    BP's 100/60, no dizziness    Will follow up with [facility] going forward and with Transplant team yearly.  Referral placed    Good appetite; reports adequate hydration.     Will talk with PCP for left lower extremity sciatic pain    Functional Score: 100   Normal no complaints; no evidence of  disease.   Employment status is: retired    I spent a total of 20 minutes with Louis Dennis reviewing medications and symptoms.

## 2024-12-19 NOTE — Progress Notes (Signed)
 Transplant Nephrology Clinic Visit    History of Present Illness  [name] is a 64 y.o. 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  -Adequate perfusion of the transplant kidney. Resistive indices within the mid main renal artery and at the anastomosis are slightly elevated. Attention on follow-up.       Subjective/Interval:     ED visit 08/09/24 for lower abdominal discomfort--resolved completely a few days later. Pain has not returned. He feels OK today.  Left hip sciatica nerve pain, requests to see neurology  BP low normal, asymptomatic.   Had labs Tuesday, Cr stable  Tolerating envarsus  1.5 mg daily and myfortic  540 mg BID    Had flu shot, declines covid.      Encouraged him to talk to PCP about sciatica pain and perhaps seeing PT or Orthopaedics        Review of Systems  Otherwise as per HPI, all other systems reviewed and are negative.    Medications  Current Outpatient Medications   Medication Sig Dispense Refill    chlorhexidine  (PERIDEX ) 0.12 % solution       DENTA 5000 PLUS  1.1 % Crea USE DAY AND NIGHT, BRUSH THOROUGHLY FOR 2 MINS AND SPIT OUT. DO NOT SWALLOW AND RINSE LIGHTLY      gabapentin  (NEURONTIN ) 100 MG capsule Take 1 capsule (100 mg total) by mouth nightly. 90 capsule 3    magnesium  oxide-Mg AA chelate (MAGNESIUM , AMINO ACID CHELATE,) 133 mg Take 1 tablet by mouth two (2) times a day. 60 tablet 11    metoPROLOL  tartrate (LOPRESSOR ) 25 MG tablet Take 1/2 tablet (12.5 mg total) by mouth two (2) times a day. 30 tablet 11    MYFORTIC  180 mg EC tablet Take 3 tablets (540 mg total) by mouth Two (2) times a day. 180 tablet 11    sucralfate  (CARAFATE ) 1 gram tablet Take 1 tablet (1 g total) by mouth four (4) times a day. 28 tablet 2    tacrolimus  (ENVARSUS  XR) 0.75 mg Tb24 extended release tablet Take 2 tablets (1.5 mg total) by mouth daily. 60 tablet 11     No current facility-administered medications for this visit.     Physical Exam  BP 99/55 (BP Site: L Arm, BP Position: Sitting, BP Cuff Size: Large)  - Pulse 67  - Temp 36.4 ??C (97.5 ??F) (Temporal)  - Ht 167.6 cm (5' 6)  - Wt 63 kg (138 lb 12.8 oz)  - BMI 22.40 kg/m??     General: no acute distress  HEENT: mucous membranes moist  Neck: neck supple, no cervical lymphadenopathy appreciated  CV: normal rate, normal rhythm, no murmur, no  gallops, no rubs appreciated  Lungs: clear to auscultation bilaterally  Abdomen: soft, non tender  Extremities:  no edema  Musculoskeletal: no visible deformity, normal range of motion.  Pulses: intact distally throughout  Neurologic: awake, alert, and oriented x3          Laboratory Data and Imaging reviewed in EPIC    Assessment: [name] is a 64 y.o. 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       Recommendations/Plan:   Allograft Function, status post DDKT: renal function stable with Cr 0.65 on 12/17/24 with a baseline of approximately 0.7-0.9 since transplant. Will continue to monitor.  UPC 0.116g today, stable, UA normal today. No DSAs in May 2025  [facility] clinic for general nephrology, he declined    Immunosuppression Management [High Risk Medical Decision Making For Drug  Therapy Requiring Intensive Monitoring For Toxicity]:  Continue Envarsus  at current dose. Targeting tacrolimus  trough level of approximately 5-7 ng/mL.  Continue Myfortic  540 mg BID.   Tacrolimus , Trough (ng/mL)   Date Value   12/17/2024 5.9   11/06/2024 4.6 (L)   09/06/2024 6.1        Blood Pressure Management: low end of normal blood pressure, asymptomatic. Will continue to monitor with no changes.  BP Readings from Last 3 Encounters:   12/19/24 99/55   10/02/24 110/70   08/15/24 102/66       Pre-diabetes: . Home BG overall controlled. A1c stable at 6.4 on 10/02/24.     s/p parathyroid  surgery - 11/11/2019. Calcium  level normal, will continue to monitor.    Gout: no flare or activity since ~2016. He has been off of allopurinol .    Lower Back Pain with midline radiculopathy: Known underlying degenerative changes. Left leg sciatic neuropathic pain--continue gabapentin  100 mg nightly    Infectious prophylaxis and monitoring: BK PCR negative, CMV PCR negative on 04/15/24    Health Maintenance:   - Colonoscopy due 2029  - Blood work once a month  -Covid vaccine today    Immunizations:  Immunization History   Administered Date(s) Administered    COVID-19 VACCINE,MRNA(MODERNA)(PF) 04/12/2020, 05/10/2020, 08/15/2020, 03/17/2021    Covid-19 Vac, (61yr+) (Comirnaty) [employer]  11/17/2023    Covid-19 Vac, (82yr+) (Spikevax) Monovalent Moderna 11/11/2022    INFLUENZA INJ MDCK PF, QUAD,(FLUCELVAX )( AND UP EGG FREE) 09/04/2017, 08/31/2020, 08/30/2021    INFLUENZA TIV (TRI) PF (IM)(HISTORICAL) 08/17/2011, 08/08/2012, 08/05/2013, 08/28/2014, 08/11/2015, 08/29/2015    INFLUENZA VACCINE IIV3(IM)(PF)6 MOS UP 10/10/2023, 10/02/2024    Influenza Vaccine Quad(IM)6 MO-Adult(PF) 08/16/2015, 09/06/2016, 09/11/2017, 08/06/2019, 11/07/2022    Influenza Virus Vaccine, unspecified formulation 08/30/2016, 09/18/2017, 09/04/2018, 10/02/2018    PNEUMOCOCCAL POLYSACCHARIDE 23-VALENT 07/06/2015    PPD Test 09/08/2011, 09/07/2012, 09/05/2013, 09/09/2014, 09/07/2015, 08/10/2016, 09/11/2017, 09/05/2018, 01/03/2019    Pneumococcal Conjugate 13-Valent 07/24/2010, 07/15/2015    Pneumococcal Conjugate 20-valent 08/30/2021    TdaP 12/31/2019         Return in about 6 months (around 06/18/2025).        Oneil Loader, DO  Division of Nephrology and Hypertension  [facility]  12/19/2024  2:36 PM

## 2024-12-27 ENCOUNTER — Inpatient Hospital Stay: Admit: 2024-12-27 | Discharge: 2024-12-27 | Payer: MEDICARE
